# Patient Record
Sex: Male | Born: 1955 | Race: Black or African American | Hispanic: No | Marital: Single | Smoking: Former smoker
Health system: Southern US, Community
[De-identification: ages and names within clinical notes are randomized; demographics above are authoritative.]

## PROBLEM LIST (undated history)

## (undated) DIAGNOSIS — D649 Anemia, unspecified: Secondary | ICD-10-CM

## (undated) DIAGNOSIS — E1169 Type 2 diabetes mellitus with other specified complication: Secondary | ICD-10-CM

## (undated) DIAGNOSIS — I4510 Unspecified right bundle-branch block: Secondary | ICD-10-CM

## (undated) DIAGNOSIS — I639 Cerebral infarction, unspecified: Secondary | ICD-10-CM

## (undated) DIAGNOSIS — H35039 Hypertensive retinopathy, unspecified eye: Secondary | ICD-10-CM

## (undated) DIAGNOSIS — I10 Essential (primary) hypertension: Secondary | ICD-10-CM

## (undated) DIAGNOSIS — I130 Hypertensive heart and chronic kidney disease with heart failure and stage 1 through stage 4 chronic kidney disease, or unspecified chronic kidney disease: Secondary | ICD-10-CM

## (undated) DIAGNOSIS — E1129 Type 2 diabetes mellitus with other diabetic kidney complication: Secondary | ICD-10-CM

## (undated) DIAGNOSIS — I1A0 Resistant hypertension: Secondary | ICD-10-CM

## (undated) DIAGNOSIS — E11319 Type 2 diabetes mellitus with unspecified diabetic retinopathy without macular edema: Secondary | ICD-10-CM

## (undated) DIAGNOSIS — E119 Type 2 diabetes mellitus without complications: Secondary | ICD-10-CM

## (undated) DIAGNOSIS — N189 Chronic kidney disease, unspecified: Secondary | ICD-10-CM

## (undated) DIAGNOSIS — I509 Heart failure, unspecified: Secondary | ICD-10-CM

## (undated) DIAGNOSIS — E785 Hyperlipidemia, unspecified: Secondary | ICD-10-CM

## (undated) DIAGNOSIS — B1920 Unspecified viral hepatitis C without hepatic coma: Secondary | ICD-10-CM

## (undated) DIAGNOSIS — M549 Dorsalgia, unspecified: Secondary | ICD-10-CM

## (undated) DIAGNOSIS — E261 Secondary hyperaldosteronism: Secondary | ICD-10-CM

## (undated) HISTORY — DX: Unspecified viral hepatitis C without hepatic coma: B19.20

## (undated) HISTORY — DX: Dorsalgia, unspecified: M54.9

## (undated) HISTORY — DX: Essential (primary) hypertension: I10

## (undated) HISTORY — DX: Resistant hypertension: I1A.0

## (undated) HISTORY — DX: Type 2 diabetes mellitus with other specified complication: E11.69

## (undated) HISTORY — PX: CATARACT EXTRACTION: SUR2

## (undated) HISTORY — DX: Heart failure, unspecified: I50.9

## (undated) HISTORY — DX: Type 2 diabetes mellitus without complications: E11.9

## (undated) HISTORY — DX: Type 2 diabetes mellitus with unspecified diabetic retinopathy without macular edema: E11.319

## (undated) HISTORY — DX: Unspecified right bundle-branch block: I45.10

## (undated) HISTORY — DX: Type 2 diabetes mellitus with other specified complication: E78.5

## (undated) HISTORY — DX: Cerebral infarction, unspecified: I63.9

## (undated) HISTORY — DX: Chronic kidney disease, unspecified: N18.9

## (undated) HISTORY — DX: Hypertensive heart and chronic kidney disease with heart failure and stage 1 through stage 4 chronic kidney disease, or unspecified chronic kidney disease: I13.0

## (undated) HISTORY — PX: EYE SURGERY: SHX253

## (undated) HISTORY — DX: Secondary hyperaldosteronism: E26.1

## (undated) HISTORY — DX: Hypertensive retinopathy, unspecified eye: H35.039

## (undated) HISTORY — DX: Type 2 diabetes mellitus with other diabetic kidney complication: E11.29

---

## 1993-06-13 DIAGNOSIS — G8929 Other chronic pain: Secondary | ICD-10-CM

## 1993-06-13 DIAGNOSIS — M545 Low back pain, unspecified: Secondary | ICD-10-CM

## 1993-06-13 HISTORY — DX: Low back pain, unspecified: M54.50

## 1993-06-13 HISTORY — PX: BACK SURGERY: SHX140

## 1993-06-13 HISTORY — DX: Other chronic pain: G89.29

## 1999-02-24 ENCOUNTER — Emergency Department (HOSPITAL_COMMUNITY): Admission: EM | Admit: 1999-02-24 | Discharge: 1999-02-24 | Payer: Self-pay | Admitting: Emergency Medicine

## 1999-06-28 ENCOUNTER — Encounter: Payer: Self-pay | Admitting: Emergency Medicine

## 1999-06-28 ENCOUNTER — Emergency Department (HOSPITAL_COMMUNITY): Admission: EM | Admit: 1999-06-28 | Discharge: 1999-06-28 | Payer: Self-pay | Admitting: Emergency Medicine

## 1999-10-26 ENCOUNTER — Emergency Department (HOSPITAL_COMMUNITY): Admission: EM | Admit: 1999-10-26 | Discharge: 1999-10-26 | Payer: Self-pay | Admitting: Emergency Medicine

## 2002-01-03 ENCOUNTER — Encounter: Payer: Self-pay | Admitting: *Deleted

## 2002-01-03 ENCOUNTER — Encounter (INDEPENDENT_AMBULATORY_CARE_PROVIDER_SITE_OTHER): Payer: Self-pay | Admitting: Specialist

## 2002-01-03 ENCOUNTER — Ambulatory Visit (HOSPITAL_COMMUNITY): Admission: RE | Admit: 2002-01-03 | Discharge: 2002-01-03 | Payer: Self-pay | Admitting: *Deleted

## 2003-05-19 ENCOUNTER — Encounter: Admission: RE | Admit: 2003-05-19 | Discharge: 2003-05-19 | Payer: Self-pay | Admitting: *Deleted

## 2004-04-08 ENCOUNTER — Emergency Department (HOSPITAL_COMMUNITY): Admission: EM | Admit: 2004-04-08 | Discharge: 2004-04-08 | Payer: Self-pay | Admitting: Emergency Medicine

## 2004-04-14 ENCOUNTER — Encounter: Admission: RE | Admit: 2004-04-14 | Discharge: 2004-04-14 | Payer: Self-pay | Admitting: Cardiology

## 2004-04-14 ENCOUNTER — Ambulatory Visit (HOSPITAL_COMMUNITY): Admission: RE | Admit: 2004-04-14 | Discharge: 2004-04-14 | Payer: Self-pay | Admitting: Cardiology

## 2004-07-28 ENCOUNTER — Emergency Department (HOSPITAL_COMMUNITY): Admission: EM | Admit: 2004-07-28 | Discharge: 2004-07-29 | Payer: Self-pay | Admitting: Emergency Medicine

## 2008-08-01 ENCOUNTER — Ambulatory Visit (HOSPITAL_BASED_OUTPATIENT_CLINIC_OR_DEPARTMENT_OTHER): Admission: RE | Admit: 2008-08-01 | Discharge: 2008-08-01 | Payer: Self-pay | Admitting: Urology

## 2010-07-03 ENCOUNTER — Encounter: Payer: Self-pay | Admitting: Cardiology

## 2010-07-04 ENCOUNTER — Encounter: Payer: Self-pay | Admitting: Cardiology

## 2010-09-28 LAB — GLUCOSE, CAPILLARY: Glucose-Capillary: 270 mg/dL — ABNORMAL HIGH (ref 70–99)

## 2010-09-28 LAB — BASIC METABOLIC PANEL
BUN: 8 mg/dL (ref 6–23)
CO2: 25 mEq/L (ref 19–32)
GFR calc Af Amer: >60 mL/min (ref >60)
GFR calc non Af Amer: >60 mL/min (ref >60)
Glucose, Bld: 201 mg/dL — ABNORMAL HIGH (ref 70–99)
Sodium: 134 mEq/L — ABNORMAL LOW (ref 135–145)

## 2010-09-28 LAB — PSA: PSA: 0.51 ng/mL (ref 0.10–4.00)

## 2010-10-26 NOTE — Op Note (Signed)
NAME:  John Jacobson, John Jacobson                ACCOUNT NO.:  [id number]   MEDICAL RECORD NO.:  [id number]          PATIENT TYPE:  AMB   LOCATION:  [facility]                         FACILITY:  [facility]   PHYSICIAN:  Hanley Ben, M.D.  DATE OF BIRTH:  [birth year]   DATE OF PROCEDURE:  08/01/2008  DATE OF DISCHARGE:                               OPERATIVE REPORT   PREOPERATIVE DIAGNOSIS:  Right hydrocele.   POSTOPERATIVE DIAGNOSIS:  Right hydrocele.   PROCEDURE:  Right hydrocelectomy.   SURGEON:  Arvil Persons, M.D.   ANESTHESIA:  General.   INDICATIONS:  The patient is a 55 years old male who had been  complaining of increasing swelling of his scrotum.  He was found on  physical examination to have a right hydrocele that was confirmed by  ultrasound.  He had been having pain in the scrotum and wanted to have  it removed.  He is scheduled today for hydrocelectomy.   DESCRIPTION OF PROCEDURE:  The patient was identified by his wrist band  and proper time-out was taken.   The patient was prepped and draped and placed in the supine position  under general anesthesia.  The scrotum was infiltrated with 0.25%  Marcaine and then a longitudinal incision was made on the scrotum.  The  incision was carried down to the tunica vaginalis which was then  incised.  About 200 mL of clear fluid were drained out of the hydrocele  sac.  Three scrotal pearls were removed.  Then using the Lord's  technique the tunica vaginalis was imbricated with #3-0 chromic.  Hemostasis was secured with electrocautery.  Then the subcutaneous  tissues were closed with #3-0 chromic and the skin was closed with #3-0  chromic.  Sterile dressing was then applied..   The patient tolerated the procedure well and left the OR in satisfactory  condition to post anesthesia care unit.      Hanley Ben, M.D.  Electronically Signed     MN/MEDQ  D:  08/01/2008  T:  08/01/2008  Job:  [id number]

## 2011-04-08 ENCOUNTER — Emergency Department (HOSPITAL_COMMUNITY)
Admission: EM | Admit: 2011-04-08 | Discharge: 2011-04-08 | Disposition: A | Discharge status: Home / Self Care | Payer: Self-pay | Attending: Emergency Medicine | Admitting: Emergency Medicine

## 2011-04-08 ENCOUNTER — Emergency Department (HOSPITAL_COMMUNITY): Payer: Self-pay

## 2011-04-08 ENCOUNTER — Inpatient Hospital Stay (INDEPENDENT_AMBULATORY_CARE_PROVIDER_SITE_OTHER)
Admission: RE | Admit: 2011-04-08 | Discharge: 2011-04-08 | Disposition: A | Discharge status: Home / Self Care | Payer: Self-pay | Source: Ambulatory Visit | Attending: Family Medicine | Admitting: Family Medicine

## 2011-04-08 DIAGNOSIS — G8929 Other chronic pain: Secondary | ICD-10-CM | POA: Insufficient documentation

## 2011-04-08 DIAGNOSIS — R079 Chest pain, unspecified: Secondary | ICD-10-CM

## 2011-04-08 DIAGNOSIS — R070 Pain in throat: Secondary | ICD-10-CM | POA: Insufficient documentation

## 2011-04-08 DIAGNOSIS — R05 Cough: Secondary | ICD-10-CM | POA: Insufficient documentation

## 2011-04-08 DIAGNOSIS — J3489 Other specified disorders of nose and nasal sinuses: Secondary | ICD-10-CM | POA: Insufficient documentation

## 2011-04-08 DIAGNOSIS — R059 Cough, unspecified: Secondary | ICD-10-CM | POA: Insufficient documentation

## 2011-04-08 DIAGNOSIS — I10 Essential (primary) hypertension: Secondary | ICD-10-CM

## 2011-04-08 DIAGNOSIS — E119 Type 2 diabetes mellitus without complications: Secondary | ICD-10-CM | POA: Insufficient documentation

## 2011-04-08 DIAGNOSIS — M549 Dorsalgia, unspecified: Secondary | ICD-10-CM | POA: Insufficient documentation

## 2011-04-08 DIAGNOSIS — I4510 Unspecified right bundle-branch block: Secondary | ICD-10-CM | POA: Insufficient documentation

## 2011-04-08 DIAGNOSIS — R7989 Other specified abnormal findings of blood chemistry: Secondary | ICD-10-CM

## 2011-04-08 DIAGNOSIS — J069 Acute upper respiratory infection, unspecified: Secondary | ICD-10-CM | POA: Insufficient documentation

## 2011-04-08 LAB — POCT I-STAT TROPONIN I

## 2011-04-08 LAB — BASIC METABOLIC PANEL
Calcium: 9.7 mg/dL (ref 8.4–10.5)
GFR calc non Af Amer: >90 mL/min (ref >90)
Glucose, Bld: 177 mg/dL — ABNORMAL HIGH (ref 70–99)
Potassium: 3.6 mEq/L (ref 3.5–5.1)
Sodium: 141 mEq/L (ref 135–145)

## 2011-04-08 LAB — DIFFERENTIAL
Basophils Absolute: 0 10*3/uL (ref 0.0–0.1)
Basophils Relative: 0 % (ref 0–1)
Eosinophils Absolute: 0.3 10*3/uL (ref 0.0–0.7)
Eosinophils Relative: 2 % (ref 0–5)
Neutrophils Relative %: 50 % (ref 43–77)

## 2011-04-08 LAB — CBC
Platelets: 177 10*3/uL (ref 150–400)
RDW: 13.2 % (ref 11.5–15.5)
WBC: 11.8 10*3/uL — ABNORMAL HIGH (ref 4.0–10.5)

## 2011-04-08 LAB — GLUCOSE, CAPILLARY: Glucose-Capillary: 203 mg/dL — ABNORMAL HIGH (ref 70–99)

## 2011-05-23 ENCOUNTER — Encounter: Payer: Self-pay | Admitting: Cardiology

## 2011-05-23 ENCOUNTER — Encounter: Payer: Self-pay | Admitting: *Deleted

## 2011-05-24 ENCOUNTER — Encounter: Payer: Self-pay | Admitting: Cardiology

## 2011-05-24 ENCOUNTER — Ambulatory Visit (INDEPENDENT_AMBULATORY_CARE_PROVIDER_SITE_OTHER): Payer: Self-pay | Admitting: Cardiology

## 2011-05-24 DIAGNOSIS — Z720 Tobacco use: Secondary | ICD-10-CM | POA: Insufficient documentation

## 2011-05-24 DIAGNOSIS — F17200 Nicotine dependence, unspecified, uncomplicated: Secondary | ICD-10-CM

## 2011-05-24 DIAGNOSIS — R9431 Abnormal electrocardiogram [ECG] [EKG]: Secondary | ICD-10-CM | POA: Insufficient documentation

## 2011-05-24 DIAGNOSIS — I10 Essential (primary) hypertension: Secondary | ICD-10-CM

## 2011-05-24 DIAGNOSIS — E1129 Type 2 diabetes mellitus with other diabetic kidney complication: Secondary | ICD-10-CM | POA: Insufficient documentation

## 2011-05-24 DIAGNOSIS — R079 Chest pain, unspecified: Secondary | ICD-10-CM | POA: Insufficient documentation

## 2011-05-24 DIAGNOSIS — E119 Type 2 diabetes mellitus without complications: Secondary | ICD-10-CM

## 2011-05-24 MED ORDER — AMLODIPINE BESYLATE 5 MG PO TABS: 5.0000 mg | ORAL_TABLET | Freq: Every day | ORAL | Status: DC

## 2011-05-24 NOTE — Assessment & Plan Note (Signed)
Patient has a right bundle branch block. Multiple risk factors. Plan stress echocardiogram for risk stratification.

## 2011-05-24 NOTE — Assessment & Plan Note (Signed)
Patient counseled on discontinuing. 

## 2011-05-24 NOTE — Progress Notes (Signed)
HPI: 55 year old male with no prior cardiac history for evaluation of chest pain and abnormal electrocardiogram. Patient typically has dyspnea only with more moderate activities. No orthopnea, PND, pedal edema, chest pain or syncope. On October 26 he was seen at urgent care for an upper respiratory infection. He was noted to have a cough and chest pain only with his cough. Electrocardiogram showed right bundle branch block. A troponin was negative. Chest x-ray negative. Cardiology asked to evaluate. Note the patient is not taking his Lotrel as he had swelling in his throat related to that medication.  No current outpatient prescriptions on file.    Allergies  Allergen Reactions  . Lotrel     Past Medical History  Diagnosis Date  . DM (diabetes mellitus)   . HTN (hypertension)   . RBBB (right bundle branch block)   . Asthma     Past Surgical History  Procedure Date  . Back surgery   . Hand surgery     History   Social History  . Marital Status: Single    Spouse Name: N/A    Number of Children: 3  . Years of Education: N/A   Occupational History  . Not on file.   Social History Main Topics  . Smoking status: Current Everyday Smoker  . Smokeless tobacco: Not on file  . Alcohol Use: Yes     Few beers every other day  . Drug Use: Not on file  . Sexually Active: Not on file   Other Topics Concern  . Not on file   Social History Narrative  . No narrative on file    Family History  Problem Relation Age of Onset  . Coronary artery disease Mother     MI in her 49s  . Hypertension    . Diabetes    . Alzheimer's disease      ROS: no fevers or chills, productive cough, hemoptysis, dysphasia, odynophagia, melena, hematochezia, dysuria, hematuria, rash, seizure activity, orthopnea, PND, pedal edema, claudication. Remaining systems are negative.  Physical Exam:   Blood pressure 181/110, pulse 80, height 5\' 7"  (1.702 m), weight 170 lb (77.111 kg).  General:  Well  developed/well nourished in NAD Skin warm/dry Patient not depressed No peripheral clubbing Back-normal HEENT-normal/normal eyelids Neck supple/normal carotid upstroke bilaterally; no bruits; no JVD; no thyromegaly chest - CTA/ normal expansion CV - RRR/normal S1 and S2; no murmurs, rubs or gallops;  PMI nondisplaced Abdomen -NT/ND, no HSM, no mass, + bowel sounds, no bruit 2+ femoral pulses, no bruits Ext-no edema, chords, 2+ DP Neuro-grossly nonfocal  ECG 04/08/11 - NSR with RBBB

## 2011-05-24 NOTE — Assessment & Plan Note (Signed)
Patient encouraged to follow up with primary care for further management.

## 2011-05-24 NOTE — Assessment & Plan Note (Addendum)
Patient had swelling of his throat with ACE inhibitor. Add Norvasc 5 mg daily. followup with primary care for further management.

## 2011-05-24 NOTE — Assessment & Plan Note (Signed)
Previous symptoms most likely from URI.

## 2011-05-24 NOTE — Patient Instructions (Signed)
Your physician recommends that you schedule a follow-up appointment in: AS NEEDED PENDING TEST RESULTS  Your physician has requested that you have a stress echocardiogram. For further information please visit [email or web address]. Please follow instruction sheet as given.   START AMLODIPINE 5 MG ONCE DAILY  FOLLOW UP WITH PRIMARY CARE FOR BLOOD PRESSURE AND BLOOD SUGAR

## 2011-06-10 ENCOUNTER — Other Ambulatory Visit (HOSPITAL_COMMUNITY): Payer: Self-pay | Admitting: Radiology

## 2011-06-24 ENCOUNTER — Other Ambulatory Visit (HOSPITAL_COMMUNITY): Payer: Self-pay | Admitting: Radiology

## 2014-06-13 HISTORY — PX: TOE AMPUTATION: SHX809

## 2016-03-28 ENCOUNTER — Emergency Department (HOSPITAL_COMMUNITY): Payer: Medicaid Other

## 2016-03-28 ENCOUNTER — Inpatient Hospital Stay (HOSPITAL_COMMUNITY)
Admission: EM | Admit: 2016-03-28 | Discharge: 2016-04-02 | DRG: 304 | Disposition: A | Discharge status: Home / Self Care | Payer: Medicaid Other | Attending: Internal Medicine | Admitting: Internal Medicine

## 2016-03-28 ENCOUNTER — Encounter (HOSPITAL_COMMUNITY): Payer: Self-pay | Admitting: Emergency Medicine

## 2016-03-28 DIAGNOSIS — N179 Acute kidney failure, unspecified: Secondary | ICD-10-CM

## 2016-03-28 DIAGNOSIS — Z888 Allergy status to other drugs, medicaments and biological substances status: Secondary | ICD-10-CM

## 2016-03-28 DIAGNOSIS — Z8249 Family history of ischemic heart disease and other diseases of the circulatory system: Secondary | ICD-10-CM

## 2016-03-28 DIAGNOSIS — Z833 Family history of diabetes mellitus: Secondary | ICD-10-CM

## 2016-03-28 DIAGNOSIS — Z9889 Other specified postprocedural states: Secondary | ICD-10-CM

## 2016-03-28 DIAGNOSIS — I4510 Unspecified right bundle-branch block: Secondary | ICD-10-CM | POA: Diagnosis present

## 2016-03-28 DIAGNOSIS — I5033 Acute on chronic diastolic (congestive) heart failure: Secondary | ICD-10-CM | POA: Diagnosis present

## 2016-03-28 DIAGNOSIS — K0889 Other specified disorders of teeth and supporting structures: Secondary | ICD-10-CM | POA: Diagnosis present

## 2016-03-28 DIAGNOSIS — I248 Other forms of acute ischemic heart disease: Secondary | ICD-10-CM | POA: Diagnosis present

## 2016-03-28 DIAGNOSIS — Z820 Family history of epilepsy and other diseases of the nervous system: Secondary | ICD-10-CM

## 2016-03-28 DIAGNOSIS — E1122 Type 2 diabetes mellitus with diabetic chronic kidney disease: Secondary | ICD-10-CM

## 2016-03-28 DIAGNOSIS — I130 Hypertensive heart and chronic kidney disease with heart failure and stage 1 through stage 4 chronic kidney disease, or unspecified chronic kidney disease: Secondary | ICD-10-CM | POA: Diagnosis present

## 2016-03-28 DIAGNOSIS — F17200 Nicotine dependence, unspecified, uncomplicated: Secondary | ICD-10-CM | POA: Diagnosis present

## 2016-03-28 DIAGNOSIS — Z794 Long term (current) use of insulin: Secondary | ICD-10-CM

## 2016-03-28 DIAGNOSIS — J45909 Unspecified asthma, uncomplicated: Secondary | ICD-10-CM | POA: Diagnosis present

## 2016-03-28 DIAGNOSIS — E1129 Type 2 diabetes mellitus with other diabetic kidney complication: Secondary | ICD-10-CM | POA: Diagnosis present

## 2016-03-28 DIAGNOSIS — N184 Chronic kidney disease, stage 4 (severe): Secondary | ICD-10-CM | POA: Diagnosis present

## 2016-03-28 DIAGNOSIS — R001 Bradycardia, unspecified: Secondary | ICD-10-CM | POA: Diagnosis present

## 2016-03-28 DIAGNOSIS — I160 Hypertensive urgency: Principal | ICD-10-CM | POA: Diagnosis present

## 2016-03-28 DIAGNOSIS — R079 Chest pain, unspecified: Secondary | ICD-10-CM | POA: Insufficient documentation

## 2016-03-28 LAB — CBC
HCT: 38.8 % — ABNORMAL LOW (ref 39.0–52.0)
Hemoglobin: 13 g/dL (ref 13.0–17.0)
MCH: 31.3 pg (ref 26.0–34.0)
MCHC: 33.5 g/dL (ref 30.0–36.0)
MCV: 93.3 fL (ref 78.0–100.0)
PLATELETS: 219 10*3/uL (ref 150–400)
RBC: 4.16 MIL/uL — ABNORMAL LOW (ref 4.22–5.81)
RDW: 14.4 % (ref 11.5–15.5)
WBC: 11.5 10*3/uL — ABNORMAL HIGH (ref 4.0–10.5)

## 2016-03-28 LAB — CBG MONITORING, ED
Glucose-Capillary: 143 mg/dL — ABNORMAL HIGH (ref 65–99)
Glucose-Capillary: 144 mg/dL — ABNORMAL HIGH (ref 65–99)

## 2016-03-28 LAB — BASIC METABOLIC PANEL
Anion gap: 8 (ref 5–15)
BUN: 31 mg/dL — AB (ref 6–20)
CHLORIDE: 108 mmol/L (ref 101–111)
CO2: 25 mmol/L (ref 22–32)
CREATININE: 1.9 mg/dL — AB (ref 0.61–1.24)
Calcium: 9.1 mg/dL (ref 8.9–10.3)
GFR calc Af Amer: 42 mL/min — ABNORMAL LOW (ref >60)
GFR calc non Af Amer: 36 mL/min — ABNORMAL LOW (ref >60)
GLUCOSE: 122 mg/dL — AB (ref 65–99)
Potassium: 4.5 mmol/L (ref 3.5–5.1)
Sodium: 141 mmol/L (ref 135–145)

## 2016-03-28 LAB — I-STAT TROPONIN, ED: Troponin i, poc: 0.02 ng/mL (ref 0.00–0.08)

## 2016-03-28 LAB — BRAIN NATRIURETIC PEPTIDE: B Natriuretic Peptide: 658.7 pg/mL — ABNORMAL HIGH (ref 0.0–100.0)

## 2016-03-28 MED ORDER — MORPHINE SULFATE (PF) 4 MG/ML IV SOLN
4.0000 mg | Freq: Once | INTRAVENOUS | Status: AC
  Administered 2016-03-28: 4 mg via INTRAVENOUS
  Filled 2016-03-28: qty 1

## 2016-03-28 MED ORDER — ASPIRIN 81 MG PO CHEW
324.0000 mg | CHEWABLE_TABLET | Freq: Once | ORAL | Status: AC
  Administered 2016-03-28: 324 mg via ORAL
  Filled 2016-03-28: qty 4

## 2016-03-28 MED ORDER — NITROGLYCERIN 0.4 MG SL SUBL
0.4000 mg | SUBLINGUAL_TABLET | SUBLINGUAL | Status: AC | PRN
  Administered 2016-03-28 (×3): 0.4 mg via SUBLINGUAL
  Filled 2016-03-28: qty 1

## 2016-03-28 NOTE — ED Triage Notes (Signed)
Pt is from home where he has had chest pain on the left side that is a tightness since last Wednesday.  He has also had increased BP and left ankle swelling.

## 2016-03-28 NOTE — ED Notes (Signed)
CBG 144 

## 2016-03-29 ENCOUNTER — Inpatient Hospital Stay (HOSPITAL_COMMUNITY): Payer: Medicaid Other

## 2016-03-29 ENCOUNTER — Encounter (HOSPITAL_COMMUNITY): Payer: Self-pay | Admitting: Emergency Medicine

## 2016-03-29 ENCOUNTER — Other Ambulatory Visit (HOSPITAL_COMMUNITY): Payer: Medicaid Other

## 2016-03-29 DIAGNOSIS — I248 Other forms of acute ischemic heart disease: Secondary | ICD-10-CM | POA: Diagnosis present

## 2016-03-29 DIAGNOSIS — R001 Bradycardia, unspecified: Secondary | ICD-10-CM | POA: Diagnosis present

## 2016-03-29 DIAGNOSIS — Z888 Allergy status to other drugs, medicaments and biological substances status: Secondary | ICD-10-CM | POA: Diagnosis not present

## 2016-03-29 DIAGNOSIS — K0889 Other specified disorders of teeth and supporting structures: Secondary | ICD-10-CM | POA: Diagnosis present

## 2016-03-29 DIAGNOSIS — I160 Hypertensive urgency: Secondary | ICD-10-CM | POA: Diagnosis present

## 2016-03-29 DIAGNOSIS — R079 Chest pain, unspecified: Secondary | ICD-10-CM | POA: Diagnosis not present

## 2016-03-29 DIAGNOSIS — I4510 Unspecified right bundle-branch block: Secondary | ICD-10-CM | POA: Diagnosis present

## 2016-03-29 DIAGNOSIS — R071 Chest pain on breathing: Secondary | ICD-10-CM

## 2016-03-29 DIAGNOSIS — Z8249 Family history of ischemic heart disease and other diseases of the circulatory system: Secondary | ICD-10-CM | POA: Diagnosis not present

## 2016-03-29 DIAGNOSIS — I130 Hypertensive heart and chronic kidney disease with heart failure and stage 1 through stage 4 chronic kidney disease, or unspecified chronic kidney disease: Secondary | ICD-10-CM | POA: Diagnosis present

## 2016-03-29 DIAGNOSIS — J45909 Unspecified asthma, uncomplicated: Secondary | ICD-10-CM | POA: Diagnosis present

## 2016-03-29 DIAGNOSIS — Z794 Long term (current) use of insulin: Secondary | ICD-10-CM | POA: Diagnosis not present

## 2016-03-29 DIAGNOSIS — Z820 Family history of epilepsy and other diseases of the nervous system: Secondary | ICD-10-CM | POA: Diagnosis not present

## 2016-03-29 DIAGNOSIS — N184 Chronic kidney disease, stage 4 (severe): Secondary | ICD-10-CM | POA: Diagnosis present

## 2016-03-29 DIAGNOSIS — N179 Acute kidney failure, unspecified: Secondary | ICD-10-CM | POA: Diagnosis present

## 2016-03-29 DIAGNOSIS — F17200 Nicotine dependence, unspecified, uncomplicated: Secondary | ICD-10-CM | POA: Diagnosis present

## 2016-03-29 DIAGNOSIS — Z833 Family history of diabetes mellitus: Secondary | ICD-10-CM | POA: Diagnosis not present

## 2016-03-29 DIAGNOSIS — Z9889 Other specified postprocedural states: Secondary | ICD-10-CM | POA: Diagnosis not present

## 2016-03-29 DIAGNOSIS — I5033 Acute on chronic diastolic (congestive) heart failure: Secondary | ICD-10-CM | POA: Diagnosis present

## 2016-03-29 DIAGNOSIS — E1122 Type 2 diabetes mellitus with diabetic chronic kidney disease: Secondary | ICD-10-CM | POA: Diagnosis present

## 2016-03-29 LAB — URINALYSIS, ROUTINE W REFLEX MICROSCOPIC
Bilirubin Urine: NEGATIVE
GLUCOSE, UA: NEGATIVE mg/dL
Ketones, ur: NEGATIVE mg/dL
LEUKOCYTES UA: NEGATIVE
Nitrite: NEGATIVE
PH: 5.5 (ref 5.0–8.0)
Protein, ur: 100 mg/dL — AB
Specific Gravity, Urine: 1.01 (ref 1.005–1.030)

## 2016-03-29 LAB — GLUCOSE, CAPILLARY
GLUCOSE-CAPILLARY: 198 mg/dL — AB (ref 65–99)
GLUCOSE-CAPILLARY: 204 mg/dL — AB (ref 65–99)
Glucose-Capillary: 130 mg/dL — ABNORMAL HIGH (ref 65–99)
Glucose-Capillary: 131 mg/dL — ABNORMAL HIGH (ref 65–99)
Glucose-Capillary: 222 mg/dL — ABNORMAL HIGH (ref 65–99)
Glucose-Capillary: 193 mg/dL — ABNORMAL HIGH (ref 65–99)

## 2016-03-29 LAB — COMPREHENSIVE METABOLIC PANEL
ALBUMIN: 3.8 g/dL (ref 3.5–5.0)
ALK PHOS: 83 U/L (ref 38–126)
ALT: 50 U/L (ref 17–63)
ANION GAP: 8 (ref 5–15)
AST: 36 U/L (ref 15–41)
BUN: 30 mg/dL — AB (ref 6–20)
CALCIUM: 9.3 mg/dL (ref 8.9–10.3)
CO2: 26 mmol/L (ref 22–32)
Chloride: 106 mmol/L (ref 101–111)
Creatinine, Ser: 1.78 mg/dL — ABNORMAL HIGH (ref 0.61–1.24)
GFR calc Af Amer: 46 mL/min — ABNORMAL LOW (ref >60)
GFR calc non Af Amer: 39 mL/min — ABNORMAL LOW (ref >60)
GLUCOSE: 147 mg/dL — AB (ref 65–99)
POTASSIUM: 4 mmol/L (ref 3.5–5.1)
SODIUM: 140 mmol/L (ref 135–145)
Total Bilirubin: 1.2 mg/dL (ref 0.3–1.2)
Total Protein: 7.7 g/dL (ref 6.5–8.1)

## 2016-03-29 LAB — ECHOCARDIOGRAM COMPLETE
Height: 67 in
Weight: 2688 oz

## 2016-03-29 LAB — URINE MICROSCOPIC-ADD ON

## 2016-03-29 LAB — TROPONIN I
TROPONIN I: 0.03 ng/mL — AB (ref <0.03)
TROPONIN I: 0.03 ng/mL — AB (ref <0.03)
Troponin I: <0.03 ng/mL (ref <0.03)

## 2016-03-29 LAB — RAPID URINE DRUG SCREEN, HOSP PERFORMED
AMPHETAMINES: NOT DETECTED
Barbiturates: NOT DETECTED
Benzodiazepines: NOT DETECTED
Cocaine: NOT DETECTED
Opiates: POSITIVE — AB
Tetrahydrocannabinol: NOT DETECTED

## 2016-03-29 LAB — SODIUM, URINE, RANDOM: Sodium, Ur: 115 mmol/L

## 2016-03-29 LAB — TSH: TSH: 9.461 u[IU]/mL — ABNORMAL HIGH (ref 0.350–4.500)

## 2016-03-29 LAB — CREATININE, URINE, RANDOM: CREATININE, URINE: 35.18 mg/dL

## 2016-03-29 LAB — MRSA PCR SCREENING: MRSA BY PCR: NEGATIVE

## 2016-03-29 LAB — CBG MONITORING, ED: Glucose-Capillary: 151 mg/dL — ABNORMAL HIGH (ref 65–99)

## 2016-03-29 MED ORDER — ONDANSETRON HCL 4 MG/2ML IJ SOLN: 4.0000 mg | Freq: Four times a day (QID) | INTRAMUSCULAR | Status: DC | PRN

## 2016-03-29 MED ORDER — ONDANSETRON HCL 4 MG PO TABS: 4.0000 mg | ORAL_TABLET | Freq: Four times a day (QID) | ORAL | Status: DC | PRN

## 2016-03-29 MED ORDER — GLIPIZIDE 10 MG PO TABS
10.0000 mg | ORAL_TABLET | Freq: Every day | ORAL | Status: DC
  Administered 2016-03-29 – 2016-04-02 (×5): 10 mg via ORAL
  Filled 2016-03-29 (×6): qty 1

## 2016-03-29 MED ORDER — HYDRALAZINE HCL 50 MG PO TABS
100.0000 mg | ORAL_TABLET | Freq: Three times a day (TID) | ORAL | Status: DC
  Administered 2016-03-29 – 2016-04-02 (×13): 100 mg via ORAL
  Filled 2016-03-29 (×13): qty 2

## 2016-03-29 MED ORDER — ASPIRIN EC 81 MG PO TBEC
81.0000 mg | DELAYED_RELEASE_TABLET | Freq: Every day | ORAL | Status: DC
  Administered 2016-03-29 – 2016-04-02 (×5): 81 mg via ORAL
  Filled 2016-03-29 (×5): qty 1

## 2016-03-29 MED ORDER — INSULIN ASPART 100 UNIT/ML ~~LOC~~ SOLN
0.0000 [IU] | Freq: Three times a day (TID) | SUBCUTANEOUS | Status: DC
  Administered 2016-03-29 (×2): 3 [IU] via SUBCUTANEOUS
  Administered 2016-03-30 (×2): 2 [IU] via SUBCUTANEOUS
  Administered 2016-03-30 – 2016-03-31 (×3): 3 [IU] via SUBCUTANEOUS
  Administered 2016-04-01 – 2016-04-02 (×2): 2 [IU] via SUBCUTANEOUS
  Administered 2016-04-02: 3 [IU] via SUBCUTANEOUS

## 2016-03-29 MED ORDER — DIPHENHYDRAMINE HCL 25 MG PO CAPS: 25.0000 mg | ORAL_CAPSULE | ORAL | Status: DC | PRN

## 2016-03-29 MED ORDER — LOSARTAN POTASSIUM 50 MG PO TABS
100.0000 mg | ORAL_TABLET | Freq: Every day | ORAL | Status: DC
  Administered 2016-03-29 – 2016-04-01 (×4): 100 mg via ORAL
  Filled 2016-03-29 (×4): qty 2

## 2016-03-29 MED ORDER — CARVEDILOL 25 MG PO TABS
25.0000 mg | ORAL_TABLET | Freq: Two times a day (BID) | ORAL | Status: DC
  Administered 2016-03-29 – 2016-04-01 (×6): 25 mg via ORAL
  Filled 2016-03-29: qty 1
  Filled 2016-03-29 (×5): qty 2
  Filled 2016-03-29: qty 1
  Filled 2016-03-29: qty 2

## 2016-03-29 MED ORDER — ACETAMINOPHEN 650 MG RE SUPP: 650.0000 mg | Freq: Four times a day (QID) | RECTAL | Status: DC | PRN

## 2016-03-29 MED ORDER — ATENOLOL 25 MG PO TABS
50.0000 mg | ORAL_TABLET | Freq: Two times a day (BID) | ORAL | Status: DC
  Administered 2016-03-29: 50 mg via ORAL
  Filled 2016-03-29: qty 2

## 2016-03-29 MED ORDER — ACETAMINOPHEN 325 MG PO TABS
650.0000 mg | ORAL_TABLET | Freq: Four times a day (QID) | ORAL | Status: DC | PRN
  Administered 2016-03-29 – 2016-03-31 (×7): 650 mg via ORAL
  Filled 2016-03-29 (×7): qty 2

## 2016-03-29 MED ORDER — MORPHINE SULFATE (PF) 4 MG/ML IV SOLN
4.0000 mg | Freq: Once | INTRAVENOUS | Status: AC
  Administered 2016-03-29: 4 mg via INTRAVENOUS
  Filled 2016-03-29: qty 1

## 2016-03-29 MED ORDER — INSULIN GLARGINE 100 UNIT/ML ~~LOC~~ SOLN
12.0000 [IU] | Freq: Every day | SUBCUTANEOUS | Status: DC
Start: 2016-03-29 — End: 2016-04-02
  Administered 2016-03-29 – 2016-04-01 (×4): 12 [IU] via SUBCUTANEOUS
  Filled 2016-03-29 (×5): qty 0.12

## 2016-03-29 MED ORDER — NITROGLYCERIN IN D5W 200-5 MCG/ML-% IV SOLN
0.0000 ug/min | Freq: Once | INTRAVENOUS | Status: AC
  Administered 2016-03-29: 5 ug/min via INTRAVENOUS
  Filled 2016-03-29: qty 250

## 2016-03-29 MED ORDER — FUROSEMIDE 10 MG/ML IJ SOLN
40.0000 mg | Freq: Once | INTRAMUSCULAR | Status: AC
Start: 2016-03-29 — End: 2016-03-29
  Administered 2016-03-29: 40 mg via INTRAVENOUS
  Filled 2016-03-29: qty 4

## 2016-03-29 MED ORDER — NITROGLYCERIN IN D5W 200-5 MCG/ML-% IV SOLN
0.0000 ug/min | INTRAVENOUS | Status: DC
Start: 2016-03-29 — End: 2016-04-02
  Administered 2016-03-29: 10 ug/min via INTRAVENOUS
  Administered 2016-03-29: 40 ug/min via INTRAVENOUS
  Administered 2016-03-29: 45 ug/min via INTRAVENOUS
  Administered 2016-03-29 (×2): 50 ug/min via INTRAVENOUS
  Administered 2016-03-30: 5 ug/min via INTRAVENOUS
  Filled 2016-03-29 (×2): qty 250

## 2016-03-29 MED ORDER — FUROSEMIDE 10 MG/ML IJ SOLN
40.0000 mg | Freq: Once | INTRAMUSCULAR | Status: DC
  Filled 2016-03-29: qty 4

## 2016-03-29 MED ORDER — SALINE SPRAY 0.65 % NA SOLN
1.0000 | NASAL | Status: DC | PRN
  Administered 2016-03-30 – 2016-04-02 (×2): 1 via NASAL
  Filled 2016-03-29 (×2): qty 44

## 2016-03-29 MED ORDER — FUROSEMIDE 10 MG/ML IJ SOLN
40.0000 mg | Freq: Two times a day (BID) | INTRAMUSCULAR | Status: DC
  Administered 2016-03-29 – 2016-03-30 (×2): 40 mg via INTRAVENOUS
  Filled 2016-03-29 (×2): qty 4

## 2016-03-29 MED ORDER — AMLODIPINE BESYLATE 5 MG PO TABS
5.0000 mg | ORAL_TABLET | Freq: Every day | ORAL | Status: DC
  Administered 2016-03-29: 5 mg via ORAL
  Filled 2016-03-29: qty 1

## 2016-03-29 MED ORDER — FUROSEMIDE 10 MG/ML IJ SOLN
40.0000 mg | Freq: Once | INTRAMUSCULAR | Status: AC
  Administered 2016-03-29: 40 mg via INTRAVENOUS
  Filled 2016-03-29: qty 4

## 2016-03-29 MED ORDER — MORPHINE SULFATE (PF) 2 MG/ML IV SOLN
2.0000 mg | INTRAVENOUS | Status: DC | PRN
  Administered 2016-03-29 – 2016-03-31 (×8): 2 mg via INTRAVENOUS
  Filled 2016-03-29 (×8): qty 1

## 2016-03-29 NOTE — Consult Note (Addendum)
Admit date: 03/28/2016 Referring Physician  Dr. Wendee Beavers Primary Physician No primary care provider on file. Primary Cardiologist  Dr. Evern Bio seen in 2012 Reason for Consultation  chest pain  HPI: 60 year old male with hypertensive urgency admitted with shortness of breath and chest pain in the setting of blood pressure more than 030 systolic. Chronic right bundle branch block noted. Chest pain was also left-sided lasting approximately 30 minutes to 1 hour during periodic episodes. Nonexertional. Sometimes worse with deep breath on left side. Has history of asthma. Edema may have been worse. He notes medication compliance during history and physical.  Back in 2012, he saw my colleague Dr. Stanford Breed in the outpatient setting and was asked to perform a stress test. I do not see that this was ever completed.  He has been given Lasix IV 40 mg.  He reports compliance with home dose of amlodipine, hydralazine, Cozaar and Tenormin. Has had severe HTN for quite some time, oral surgeon has not been able to pull teeth because of this.   PMH:   Past Medical History:  Diagnosis Date  . Asthma   . DM (diabetes mellitus) ([place])   . HTN (hypertension)   . RBBB (right bundle branch block)     PSH:   Past Surgical History:  Procedure Laterality Date  . BACK SURGERY    . HAND SURGERY     Allergies:  Amlodipine besy-benazepril hcl Prior to Admit Meds:   Prior to Admission medications   Medication Sig Start Date End Date Taking? Authorizing Provider  atenolol (TENORMIN) 50 MG tablet Take 50 mg by mouth 2 (two) times daily.   Yes Historical Provider, MD  furosemide (LASIX) 20 MG tablet Take 20 mg by mouth daily.   Yes Historical Provider, MD  glipiZIDE (GLUCOTROL) 10 MG tablet Take 10 mg by mouth daily before breakfast.   Yes Historical Provider, MD  glucose 4 GM chewable tablet Chew 1 tablet by mouth daily as needed for low blood sugar.   Yes Historical Provider, MD  hydrALAZINE (APRESOLINE)  100 MG tablet Take 100 mg by mouth 3 (three) times daily.   Yes Historical Provider, MD  ibuprofen (ADVIL,MOTRIN) 600 MG tablet Take 600 mg by mouth 2 (two) times daily.   Yes Historical Provider, MD  insulin glargine (LANTUS) 100 UNIT/ML injection Inject 12-16 Units into the skin at bedtime. Sliding scale.   Yes Historical Provider, MD  losartan (COZAAR) 100 MG tablet Take 100 mg by mouth daily.   Yes Historical Provider, MD  Skin Protectants, Misc. (EUCERIN) cream Apply 1 application topically daily. On feet   Yes Historical Provider, MD  amLODipine (NORVASC) 5 MG tablet Take 1 tablet (5 mg total) by mouth daily. 05/24/11 05/23/12  Lelon Perla, MD   Current meds: Scheduled Meds: . amLODipine  5 mg Oral Daily  . aspirin EC  81 mg Oral Daily  . atenolol  50 mg Oral BID  . furosemide  40 mg Intravenous Once  . glipiZIDE  10 mg Oral QAC breakfast  . hydrALAZINE  100 mg Oral TID  . insulin glargine  12 Units Subcutaneous QHS  . losartan  100 mg Oral Daily   Continuous Infusions: . nitroGLYCERIN 45 mcg/min (03/29/16 1156)   PRN Meds:.acetaminophen **OR** acetaminophen, morphine injection, ondansetron **OR** ondansetron (ZOFRAN) IV  Fam HX:    Family History  Problem Relation Age of Onset  . Coronary artery disease Mother     MI in her 79s  . Hypertension    .  Diabetes    . Alzheimer's disease     Social HX:    Social History   Social History  . Marital status: Single    Spouse name: N/A  . Number of children: 3  . Years of education: N/A   Occupational History  . Not on file.   Social History Main Topics  . Smoking status: Current Every Day Smoker  . Smokeless tobacco: Never Used  . Alcohol use Yes     Comment: Few beers every other day  . Drug use: No  . Sexual activity: Not Currently   Other Topics Concern  . Not on file   Social History Narrative  . No narrative on file     ROS:  All 11 ROS were addressed and are negative except what is stated in the  HPI   Physical Exam: Blood pressure (!) 196/99, pulse 61, temperature 97.8 F (36.6 C), temperature source Oral, resp. rate 15, height 5\' 7"  (1.702 m), weight 168 lb (76.2 kg), SpO2 96 %.   General: Well developed, well nourished, in no acute distress Head: Eyes PERRLA, No xanthomas.   Normal cephalic and atramatic  Lungs:   Clear bilaterally to auscultation and percussion. Normal respiratory effort. No wheezes, no rales. Heart:   HRRR S1 S2 +S3. Pulses are 2+ & equal. No murmur, rubs, gallops.  No carotid bruit. No JVD.  No abdominal bruits.  Abdomen: Bowel sounds are positive, abdomen soft and non-tender without masses. No hepatosplenomegaly. Msk:  Back normal. Normal strength and tone for age. Extremities:  No clubbing, cyanosis or edema.  DP +1 Neuro: Alert and oriented X 3, non-focal, MAE x 4 GU: Deferred Rectal: Deferred Psych:  Good affect, responds appropriately      Labs: Lab Results  Component Value Date   WBC 11.5 (H) 03/28/2016   HGB 13.0 03/28/2016   HCT 38.8 (L) 03/28/2016   MCV 93.3 03/28/2016   PLT 219 03/28/2016     Recent Labs Lab 03/29/16 0704  NA 140  K 4.0  CL 106  CO2 26  BUN 30*  CREATININE 1.78*  CALCIUM 9.3  PROT 7.7  BILITOT 1.2  ALKPHOS 83  ALT 50  AST 36  GLUCOSE 147*    Recent Labs  03/29/16 0704  TROPONINI 0.03*   No results found for: CHOL, HDL, LDLCALC, TRIG No results found for: DDIMER   Radiology:  Dg Chest 2 View  Result Date: 03/28/2016 CLINICAL DATA:  Left-sided chest pain with shortness of Breath EXAM: CHEST  2 VIEW COMPARISON:  04/08/2011 FINDINGS: Cardiac shadow is mildly enlarged. The lungs are well aerated bilaterally. Mild fullness of the central vasculature is noted without pulmonary edema. No other focal abnormality is seen. IMPRESSION: Mild vascular congestion without pulmonary edema Electronically Signed   By: Inez Catalina M.D.   On: 03/28/2016 21:03   Personally viewed.  EKG:  Right bundle branch block  chronic with no ST segment changes Personally viewed.   ASSESSMENT/PLAN:    60 year old male with hypertensive urgency who complained of chest discomfort.  Chest pain/hypertensive urgency  - Troponin minimally elevated at 0.03  - Currently feeling better. ECHO P  - Certainly his chest discomfort a be attributed to supply demand mismatch in setting of severe hypertension and #1 goal is to control his blood pressure or effectively. Continue to utilize nitroglycerin drip then wean when possible.  - BNP elevated at 658 - likely secondary to atrial stretch in the setting of hypertensive urgency. As  Lasix is utilized IV, and finds status under better control, blood pressure should follow.  - I would be hesitant to use spironolactone which is often helpful in multidrug resistant hypertension given his chronic kidney disease and risk for hyperkalemia.  - Once blood pressure is under better control, I would advocate for nuclear stress test almost troponin becomes markedly positive.  - changing atenolol 50 BID to COREG 25 BID (may give better BP control)  Right bundle branch block  - Chronic, no change from 2012 EKG. Her no ischemic changes noted.  Mildly elevated troponin-0.03  - Possibly demand ischemia in the setting of hypertensive urgency. If troponin however increases significantly, greater than 500% for instance, one could consider further ischemic evaluation such as cardiac catheterization.  Chronic kidney disease stage 3/4  - Creatinine 1.78  We will follow along  Candee Furbish, MD  03/29/2016  12:36 PM

## 2016-03-29 NOTE — H&P (Signed)
History and Physical    John Jacobson:[id number] DOB: 1955/09/24 DOA: 03/28/2016  PCP: No primary care provider on file.  Patient coming from: Home.  Chief Complaint: Shortness of breath and chest pain.  HPI: John Jacobson is a 60 y.o. male with hypertension, diabetes mellitus presents to the ER because of shortness of breath and chest pain. Patient has been having these symptoms for last 3 days. Patient's shortness of breath increases on exertion. Denies any associated productive cough fever chills. Chest pain is left side of the chest and also there is some congestion-like feeling in the center of the chest. Pain lasts for around half an hour to 1 hour each time and recurs. Patient also noticed some lower extremity edema. In the ER patient's blood pressure was more than 811 systolic with chest x-ray showing congestion and EKG showing sinus rhythm with RBBB. Cardiac markers were negative. Patient is being admitted for hypertensive urgency with possible CHF and further management of chest pain. Patient states he has been compliant with his medications.   ED Course: Patient was started on nitroglycerin infusion.  Review of Systems: As per HPI, rest all negative.   Past Medical History:  Diagnosis Date  . Asthma   . DM (diabetes mellitus) ([place])   . HTN (hypertension)   . RBBB (right bundle branch block)     Past Surgical History:  Procedure Laterality Date  . BACK SURGERY    . HAND SURGERY       reports that he has been smoking.  He has never used smokeless tobacco. He reports that he drinks alcohol. His drug history is not on file.  Allergies  Allergen Reactions  . Amlodipine Besy-Benazepril Hcl Shortness Of Breath and Swelling    Mouth and tongue swelling    Family History  Problem Relation Age of Onset  . Coronary artery disease Mother     MI in her 35s  . Hypertension    . Diabetes    . Alzheimer's disease      Prior to Admission medications   Medication Sig  Start Date End Date Taking? Authorizing Provider  atenolol (TENORMIN) 50 MG tablet Take 50 mg by mouth 2 (two) times daily.   Yes Historical Provider, MD  furosemide (LASIX) 20 MG tablet Take 20 mg by mouth daily.   Yes Historical Provider, MD  glipiZIDE (GLUCOTROL) 10 MG tablet Take 10 mg by mouth daily before breakfast.   Yes Historical Provider, MD  glucose 4 GM chewable tablet Chew 1 tablet by mouth daily as needed for low blood sugar.   Yes Historical Provider, MD  hydrALAZINE (APRESOLINE) 100 MG tablet Take 100 mg by mouth 3 (three) times daily.   Yes Historical Provider, MD  ibuprofen (ADVIL,MOTRIN) 600 MG tablet Take 600 mg by mouth 2 (two) times daily.   Yes Historical Provider, MD  insulin glargine (LANTUS) 100 UNIT/ML injection Inject 12-16 Units into the skin at bedtime. Sliding scale.   Yes Historical Provider, MD  losartan (COZAAR) 100 MG tablet Take 100 mg by mouth daily.   Yes Historical Provider, MD  Skin Protectants, Misc. (EUCERIN) cream Apply 1 application topically daily. On feet   Yes Historical Provider, MD  amLODipine (NORVASC) 5 MG tablet Take 1 tablet (5 mg total) by mouth daily. 05/24/11 05/23/12  Lelon Perla, MD    Physical Exam: Vitals:   03/29/16 0315 03/29/16 0330 03/29/16 0504 03/29/16 0530  BP: (!) 179/102 (!) 185/103 (!) 201/104 (!) 184/110  Pulse: 65 64 70 64  Resp: 18 16 19 17   Temp:   98.9 F (37.2 C)   TempSrc:   Oral   SpO2: 93% 96% 100% 95%  Weight:      Height:          Constitutional: Moderately built and nourished. Vitals:   03/29/16 0315 03/29/16 0330 03/29/16 0504 03/29/16 0530  BP: (!) 179/102 (!) 185/103 (!) 201/104 (!) 184/110  Pulse: 65 64 70 64  Resp: 18 16 19 17   Temp:   98.9 F (37.2 C)   TempSrc:   Oral   SpO2: 93% 96% 100% 95%  Weight:      Height:       Eyes: Anicteric no pallor. ENMT: No discharge from the ears eyes nose or mouth. Neck: No JVD appreciated no mass felt. No neck rigidity. Respiratory: No rhonchi  or crepitations. Cardiovascular: S1 and S2 heard. No murmur appreciated. Abdomen: Soft nontender bowel sounds present. No guarding or rigidity. Musculoskeletal: No edema. No joint effusion. Skin: No rash skin appears warm. Neurologic: Alert awake oriented to time place and person. Moves all extremities. Psychiatric: Appears normal. Normal affect.   Labs on Admission: I have personally reviewed following labs and imaging studies  CBC:  Recent Labs Lab 03/28/16 2050  WBC 11.5*  HGB 13.0  HCT 38.8*  MCV 93.3  PLT 646   Basic Metabolic Panel:  Recent Labs Lab 03/28/16 2050  NA 141  K 4.5  CL 108  CO2 25  GLUCOSE 122*  BUN 31*  CREATININE 1.90*  CALCIUM 9.1   GFR: Estimated Creatinine Clearance: 38.2 mL/min (by C-G formula based on SCr of 1.9 mg/dL (H)). Liver Function Tests: No results for input(s): AST, ALT, ALKPHOS, BILITOT, PROT, ALBUMIN in the last 168 hours. No results for input(s): LIPASE, AMYLASE in the last 168 hours. No results for input(s): AMMONIA in the last 168 hours. Coagulation Profile: No results for input(s): INR, PROTIME in the last 168 hours. Cardiac Enzymes: No results for input(s): CKTOTAL, CKMB, CKMBINDEX, TROPONINI in the last 168 hours. BNP (last 3 results) No results for input(s): PROBNP in the last 8760 hours. HbA1C: No results for input(s): HGBA1C in the last 72 hours. CBG:  Recent Labs Lab 03/28/16 2259 03/29/16 0000 03/29/16 0059  GLUCAP 144* 143* 151*   Lipid Profile: No results for input(s): CHOL, HDL, LDLCALC, TRIG, CHOLHDL, LDLDIRECT in the last 72 hours. Thyroid Function Tests: No results for input(s): TSH, T4TOTAL, FREET4, T3FREE, THYROIDAB in the last 72 hours. Anemia Panel: No results for input(s): VITAMINB12, FOLATE, FERRITIN, TIBC, IRON, RETICCTPCT in the last 72 hours. Urine analysis: No results found for: COLORURINE, APPEARANCEUR, LABSPEC, PHURINE, GLUCOSEU, HGBUR, BILIRUBINUR, KETONESUR, PROTEINUR, UROBILINOGEN,  NITRITE, LEUKOCYTESUR Sepsis Labs: @LABRCNTIP (procalcitonin:4,lacticidven:4) )No results found for this or any previous visit (from the past 240 hour(s)).   Radiological Exams on Admission: Dg Chest 2 View  Result Date: 03/28/2016 CLINICAL DATA:  Left-sided chest pain with shortness of Breath EXAM: CHEST  2 VIEW COMPARISON:  04/08/2011 FINDINGS: Cardiac shadow is mildly enlarged. The lungs are well aerated bilaterally. Mild fullness of the central vasculature is noted without pulmonary edema. No other focal abnormality is seen. IMPRESSION: Mild vascular congestion without pulmonary edema Electronically Signed   By: Inez Catalina M.D.   On: 03/28/2016 21:03    EKG: Independently reviewed. Normal sinus rhythm with RBBB.  Assessment/Plan Principal Problem:   Hypertensive urgency Active Problems:   Chest pain   DM (diabetes mellitus), type 2  with renal complications ([place])    1. Hypertensive urgency - patient states he has been compliant with his medications. Continue home dose of amlodipine, hydralazine, Cozaar and Tenormin. Patient is on nitroglycerin infusion. Once patient takes home dose of antihypertensive will try to wean off nitroglycerin infusion. Check urine drug screen. 2. Possible CHF - I have ordered 1 dose of Lasix 40 mg IV which may also help with patient's blood pressure. Check 2-D echo. Cycle cardiac markers. Closely follow daily weights intake output and metabolic panel. 3. Chest pain - may be related to the blood pressure. Patient does have risk factors for ACS. Cycle cardiac markers aspirin and patient is on nitroglycerin. Check 2-D echo. Patient's pain improves so unlikely to be dissection. 4. Renal failure probably acute - patient's old labs of 5 years ago which was showing normal creatinine. No recent labs to compare. Check UA, FENa. Closely follow metabolic panel. May have to hold Cozaar and Lasix if creatinine worsens. 5. Diabetes mellitus type 2 - continue Lantus  insulin.   DVT prophylaxis: SCDs for now until blood pressure improves. Code Status: Full code.  Family Communication: Discussed with patient.  Disposition Plan: Home.  Consults called: None.  Admission status: Inpatient. Stepdown. Likely stay 2 days.    Rise Patience MD [facility] Pager [phone].  If 7PM-7AM, please contact night-coverage [email or web address] Password [email or web address]  03/29/2016, 6:40 AM

## 2016-03-29 NOTE — Progress Notes (Signed)
K.Schoor informed of Troponin level now 0.03

## 2016-03-29 NOTE — ED Notes (Signed)
CBG 151. 

## 2016-03-29 NOTE — Progress Notes (Signed)
  Echocardiogram 2D Echocardiogram has been performed.  John Jacobson 03/29/2016, 4:01 PM

## 2016-03-29 NOTE — Progress Notes (Addendum)
Patient seen and evaluated earlier this AM by my associate. Please refer to H and P for details. Will titrate of Nitro once blood pressures come down. Aiming for MAP of 123 at that point would hold off on nitro drip for the next 24 hours.  Gen: pt in nad, alert and awake CV: no cyanosis or clubbing Pulm: no increased wob, no wheezes  John Jacobson  Pt complaining of chest pain in the context of hypertensive emergency. I suspect it is secondary to this. [facility] for further evaluations from their standpoint.

## 2016-03-29 NOTE — Care Management Note (Signed)
Case Management Note  Patient Details  Name: John Jacobson MRN: [id number] Date of Birth: [birth year]  Subjective/Objective:      Chest pain with hypertension requiring iv ntg drip and antihypertensives              Action/Plan:  From home   Expected Discharge Date:                  Expected Discharge Plan:  Home/Self Care  In-House Referral:     Discharge planning Services     Post Acute Care Choice:    Choice offered to:     DME Arranged:    DME Agency:     HH Arranged:    HH Agency:     Status of Service:  In process, will continue to follow  If discussed at Long Length of Stay Meetings, dates discussed:    Additional Comments: Date:  March 29, 2016 Chart reviewed for concurrent status and case management needs. Will continue to follow the patient for status change: Discharge Planning: following for needs Expected discharge date: [id number] Velva Harman, BSN, [place], [place] Leeroy Cha, RN 03/29/2016, 8:45 AM

## 2016-03-29 NOTE — ED Notes (Signed)
CBG 143  

## 2016-03-30 ENCOUNTER — Inpatient Hospital Stay (HOSPITAL_COMMUNITY): Payer: Medicaid Other

## 2016-03-30 DIAGNOSIS — N179 Acute kidney failure, unspecified: Secondary | ICD-10-CM

## 2016-03-30 DIAGNOSIS — I160 Hypertensive urgency: Principal | ICD-10-CM

## 2016-03-30 DIAGNOSIS — Z794 Long term (current) use of insulin: Secondary | ICD-10-CM

## 2016-03-30 DIAGNOSIS — R079 Chest pain, unspecified: Secondary | ICD-10-CM

## 2016-03-30 DIAGNOSIS — E1122 Type 2 diabetes mellitus with diabetic chronic kidney disease: Secondary | ICD-10-CM

## 2016-03-30 LAB — BASIC METABOLIC PANEL
ANION GAP: 10 (ref 5–15)
BUN: 33 mg/dL — ABNORMAL HIGH (ref 6–20)
CHLORIDE: 102 mmol/L (ref 101–111)
CO2: 24 mmol/L (ref 22–32)
Calcium: 9.1 mg/dL (ref 8.9–10.3)
Creatinine, Ser: 2.07 mg/dL — ABNORMAL HIGH (ref 0.61–1.24)
GFR calc Af Amer: 38 mL/min — ABNORMAL LOW (ref >60)
GFR, EST NON AFRICAN AMERICAN: 33 mL/min — AB (ref >60)
GLUCOSE: 166 mg/dL — AB (ref 65–99)
POTASSIUM: 4.1 mmol/L (ref 3.5–5.1)
Sodium: 136 mmol/L (ref 135–145)

## 2016-03-30 LAB — T4, FREE: Free T4: 1.26 ng/dL — ABNORMAL HIGH (ref 0.61–1.12)

## 2016-03-30 LAB — GLUCOSE, CAPILLARY
GLUCOSE-CAPILLARY: 135 mg/dL — AB (ref 65–99)
GLUCOSE-CAPILLARY: 176 mg/dL — AB (ref 65–99)
Glucose-Capillary: 144 mg/dL — ABNORMAL HIGH (ref 65–99)
Glucose-Capillary: 128 mg/dL — ABNORMAL HIGH (ref 65–99)

## 2016-03-30 MED ORDER — AMLODIPINE BESYLATE 10 MG PO TABS
10.0000 mg | ORAL_TABLET | Freq: Every day | ORAL | Status: DC
  Administered 2016-03-30 – 2016-04-02 (×4): 10 mg via ORAL
  Filled 2016-03-30 (×4): qty 1

## 2016-03-30 MED ORDER — OXYCODONE HCL 5 MG PO TABS
5.0000 mg | ORAL_TABLET | ORAL | Status: DC | PRN
  Administered 2016-03-30 (×3): 10 mg via ORAL
  Administered 2016-03-31: 5 mg via ORAL
  Administered 2016-03-31 – 2016-04-01 (×5): 10 mg via ORAL
  Filled 2016-03-30 (×5): qty 2
  Filled 2016-03-30: qty 1
  Filled 2016-03-30 (×3): qty 2

## 2016-03-30 MED ORDER — CLONIDINE HCL 0.1 MG PO TABS
0.1000 mg | ORAL_TABLET | Freq: Three times a day (TID) | ORAL | Status: DC
  Administered 2016-03-30 – 2016-03-31 (×3): 0.1 mg via ORAL
  Filled 2016-03-30 (×3): qty 1

## 2016-03-30 MED ORDER — HYDRALAZINE HCL 20 MG/ML IJ SOLN
10.0000 mg | Freq: Four times a day (QID) | INTRAMUSCULAR | Status: DC | PRN
  Administered 2016-03-30: 10 mg via INTRAVENOUS
  Filled 2016-03-30: qty 1

## 2016-03-30 NOTE — Progress Notes (Addendum)
[facility] PROGRESS NOTE  EUGEAN ARNOTT LEX:[id number] DOB: February 08, 1956 DOA: 03/28/2016  PCP: No primary care provider on file.  Brief History/Interval Summary: 60 year old African-American male with a past medical history of hypertension, diabetes, presented with complaints of chest pain and shortness of breath. Symptoms had been ongoing for 3 days. Patient gets most of his care at the [place].  Reason for Visit: Malignant hypertension  Consultants: Cardiology  Procedures:  Transthoracic echocardiogram Study Conclusions  - Left ventricle: The cavity size was normal. There was moderate   concentric and severe asymmetric hypertrophy. Systolic function   was normal. The estimated ejection fraction was in the range of   60% to 65%. Wall motion was normal; there were no regional wall   motion abnormalities. Features are consistent with a pseudonormal   left ventricular filling pattern, with concomitant abnormal   relaxation and increased filling pressure (grade 2 diastolic   dysfunction). Doppler parameters are consistent with high   ventricular filling pressure. - Pulmonic valve: There was trivial regurgitation.   Antibiotics: None  Subjective/Interval History: Patient states that his chest pain has improved. He denies any difficulty breathing. Continues to have some headache. Apparently has also had some dental issues and is supposed to have his teeth removed in the near future. So, he also has tooth pain.  ROS: Denies any nausea or vomiting.  Objective:  Vital Signs  Vitals:   03/30/16 0800 03/30/16 0830 03/30/16 1020 03/30/16 1051  BP: (!) 202/91 (!) 202/91 (!) 240/135 (!) 238/114  Pulse:  70    Resp: (!) 21     Temp:      TempSrc:      SpO2: 95%     Weight:      Height:        Intake/Output Summary (Last 24 hours) at 03/30/16 1052 Last data filed at 03/30/16 1000  Gross per 24 hour  Intake             1550 ml  Output             4000 ml  Net             -2450 ml   Filed Weights   03/28/16 2001 03/30/16 0500  Weight: 76.2 kg (168 lb) 75.5 kg (166 lb 7.2 oz)    General appearance: alert, cooperative, appears stated age and no distress Resp: Few crackles at the bases. No wheezing. Reasonably good air entry bilaterally. Cardio: regular rate and rhythm, S1, S2 normal, no murmur, click, rub or gallop GI: soft, non-tender; bowel sounds normal; no masses,  no organomegaly Extremities: extremities normal, atraumatic, no cyanosis or edema Neurologic: Awake and alert. Oriented 3. No focal neurological deficits.  Lab Results:  Data Reviewed: I have personally reviewed following labs and imaging studies  CBC:  Recent Labs Lab 03/28/16 2050  WBC 11.5*  HGB 13.0  HCT 38.8*  MCV 93.3  PLT 449    Basic Metabolic Panel:  Recent Labs Lab 03/28/16 2050 03/29/16 0704 03/30/16 0804  NA 141 140 136  K 4.5 4.0 4.1  CL 108 106 102  CO2 25 26 24   GLUCOSE 122* 147* 166*  BUN 31* 30* 33*  CREATININE 1.90* 1.78* 2.07*  CALCIUM 9.1 9.3 9.1    GFR: Estimated Creatinine Clearance: 35 mL/min (by C-G formula based on SCr of 2.07 mg/dL (H)).  Liver Function Tests:  Recent Labs Lab 03/29/16 0704  AST 36  ALT 50  ALKPHOS 83  BILITOT 1.2  PROT 7.7  ALBUMIN 3.8   Cardiac Enzymes:  Recent Labs Lab 03/29/16 0704 03/29/16 1206 03/29/16 1805  TROPONINI 0.03* <0.03 0.03*    CBG:  Recent Labs Lab 03/29/16 1203 03/29/16 1549 03/29/16 1734 03/29/16 2224 03/30/16 0744  GLUCAP 198* 222* 193* 204* 144*    Thyroid Function Tests:  Recent Labs  03/29/16 0704 03/30/16 0804  TSH 9.461*  --   FREET4  --  1.26*     Recent Results (from the past 240 hour(s))  MRSA PCR Screening     Status: None   Collection Time: 03/29/16  6:24 AM  Result Value Ref Range Status   MRSA by PCR NEGATIVE NEGATIVE Final    Comment:        The GeneXpert MRSA Assay (FDA approved for NASAL specimens only), is one component of a comprehensive  MRSA colonization surveillance program. It is not intended to diagnose MRSA infection nor to guide or monitor treatment for MRSA infections.       Radiology Studies: Dg Chest 2 View  Result Date: 03/28/2016 CLINICAL DATA:  Left-sided chest pain with shortness of Breath EXAM: CHEST  2 VIEW COMPARISON:  04/08/2011 FINDINGS: Cardiac shadow is mildly enlarged. The lungs are well aerated bilaterally. Mild fullness of the central vasculature is noted without pulmonary edema. No other focal abnormality is seen. IMPRESSION: Mild vascular congestion without pulmonary edema Electronically Signed   By: Inez Catalina M.D.   On: 03/28/2016 21:03     Medications:  Scheduled: . amLODipine  10 mg Oral Daily  . aspirin EC  81 mg Oral Daily  . carvedilol  25 mg Oral BID WC  . furosemide  40 mg Intravenous Once  . glipiZIDE  10 mg Oral QAC breakfast  . hydrALAZINE  100 mg Oral TID  . insulin aspart  0-15 Units Subcutaneous TID WC  . insulin glargine  12 Units Subcutaneous QHS  . losartan  100 mg Oral Daily   Continuous: . 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 Patient was initially placed on nitroglycerin infusion. His symptoms improved. His blood pressure improved as well. He was also started on this home medication regimen. However, since last night blood pressure has climbed back up. We will increase the dose of his amlodipine. He may need to be placed back on the nitroglycerin infusion. Old records reviewed. He's always had significantly elevated blood pressures. In 4010, his systolic blood pressure was 181, when he  was seen by cardiology and their office. When he was seen by his primary care provider this past Thursday, his systolic was in the 272Z. At that time, he was started on oral hydralazine. Etiology for his hypertension is unclear. Could be primary but there could also be secondary issues. He appears to have chronic kidney disease. Proceed with renal ultrasound. May need to add additional agents such as clonidine. Currently he is on carvedilol, hydralazine, losartan and amlodipine.  Acute on chronic Diastolic CHF Echocardiogram does not diastolic dysfunction. Patient was given Lasix with good diuresis. He states that his lower extremity edema has improved. However His creatinine has climbed. Cut back Lasix today. Strict ins and outs and daily weights. Fluid restriction.  Chest pain. Most likely  secondary to repeated blood pressure. Cardiology is following and to determine further management. Continue aspirin.  Acute on chronic kidney disease, possibly stage III According to records, the patient showed me, it appears that he has been diagnosed with chronic kidney disease. Baseline renal function is not known, however. Check renal ultrasound. Monitor urine output.  Diabetes mellitus type 2. Continue with Lantus. Sliding scale insulin coverage. Check HbA1c.  Abnormal thyroid function tests. TSH is noted to be elevated. Free T4, however, also noted to be slightly related. Would recommend repeating these tests in a few weeks in the outpatient setting.   DVT Prophylaxis: SCDs    Code Status: Full code  Family Communication: Discussed with the patient. No family at bedside  Disposition Plan: Await improvement in blood pressure.   LOS: 1 day   [facility] Pager [phone] 03/30/2016, 10:52 AM  If 7PM-7AM, please contact night-coverage at [email or web address], password [facility]

## 2016-03-30 NOTE — Progress Notes (Addendum)
Patient Name: John Jacobson Date of Encounter: 03/30/2016  Primary Cardiologist: Dr. Talmadge Chad Problem List     Principal Problem:   Hypertensive urgency Active Problems:   Chest pain   DM (diabetes mellitus), type 2 with renal complications ([place])   ARF (acute renal failure) (HCC)    Subjective   NTG drip stopped yesterday evening. Became hypertensive again this AM with SBP > 200. Patient reports having significant mouth pain, planning to have multiple tooth extractions. He feels this is significantly contributing to his HTN.   Inpatient Medications    Scheduled Meds: . amLODipine  10 mg Oral Daily  . aspirin EC  81 mg Oral Daily  . carvedilol  25 mg Oral BID WC  . glipiZIDE  10 mg Oral QAC breakfast  . hydrALAZINE  100 mg Oral TID  . insulin aspart  0-15 Units Subcutaneous TID WC  . insulin glargine  12 Units Subcutaneous QHS  . losartan  100 mg Oral Daily   Continuous Infusions: . nitroGLYCERIN 5 mcg/min (03/30/16 1121)   PRN Meds: acetaminophen **OR** acetaminophen, diphenhydrAMINE, hydrALAZINE, morphine injection, ondansetron **OR** ondansetron (ZOFRAN) IV, oxyCODONE, sodium chloride   Vital Signs    Vitals:   03/30/16 0800 03/30/16 0830 03/30/16 1020 03/30/16 1051  BP: (!) 202/91 (!) 202/91 (!) 240/135 (!) 238/114  Pulse:  70    Resp: (!) 21     Temp:      TempSrc:      SpO2: 95%     Weight:      Height:        Intake/Output Summary (Last 24 hours) at 03/30/16 1123 Last data filed at 03/30/16 1000  Gross per 24 hour  Intake             1310 ml  Output             4000 ml  Net            -2690 ml   Filed Weights   03/28/16 2001 03/30/16 0500  Weight: 168 lb (76.2 kg) 166 lb 7.2 oz (75.5 kg)    Physical Exam   GEN: Well nourished, well developed, African American male appearing in no acute distress.  HEENT: Grossly normal.  Neck: Supple, no JVD, carotid bruits, or masses. Cardiac: RRR, no murmurs, rubs, + split S2. No clubbing,  cyanosis, edema.  Radials/DP/PT 2+ and equal bilaterally.  Respiratory:  Respirations regular and unlabored, clear to auscultation bilaterally. GI: Soft, nontender, nondistended, BS + x 4. MS: no deformity or atrophy. Skin: warm and dry, no rash. Neuro:  Strength and sensation are intact. Psych: AAOx3.  Normal affect.  Labs    CBC  Recent Labs  03/28/16 2050  WBC 11.5*  HGB 13.0  HCT 38.8*  MCV 93.3  PLT 998   Basic Metabolic Panel  Recent Labs  03/29/16 0704 03/30/16 0804  NA 140 136  K 4.0 4.1  CL 106 102  CO2 26 24  GLUCOSE 147* 166*  BUN 30* 33*  CREATININE 1.78* 2.07*  CALCIUM 9.3 9.1   Liver Function Tests  Recent Labs  03/29/16 0704  AST 36  ALT 50  ALKPHOS 83  BILITOT 1.2  PROT 7.7  ALBUMIN 3.8   No results for input(s): LIPASE, AMYLASE in the last 72 hours. Cardiac Enzymes  Recent Labs  03/29/16 0704 03/29/16 1206 03/29/16 1805  TROPONINI 0.03* <0.03 0.03*      Recent Labs  03/29/16 0704  TSH  9.461*    Telemetry    NSR, HR in 60's - 70's. - Personally Reviewed  ECG    NSR, HR 61, with known RBBB - Personally Reviewed  Radiology    Dg Chest 2 View  Result Date: 03/28/2016 CLINICAL DATA:  Left-sided chest pain with shortness of Breath EXAM: CHEST  2 VIEW COMPARISON:  04/08/2011 FINDINGS: Cardiac shadow is mildly enlarged. The lungs are well aerated bilaterally. Mild fullness of the central vasculature is noted without pulmonary edema. No other focal abnormality is seen. IMPRESSION: Mild vascular congestion without pulmonary edema Electronically Signed   By: Inez Catalina M.D.   On: 03/28/2016 21:03    Cardiac Studies   Echocardiogram: 03/29/2016 Study Conclusions  - Left ventricle: The cavity size was normal. There was moderate   concentric and severe asymmetric hypertrophy. Systolic function   was normal. The estimated ejection fraction was in the range of   60% to 65%. Wall motion was normal; there were no regional  wall   motion abnormalities. Features are consistent with a pseudonormal   left ventricular filling pattern, with concomitant abnormal   relaxation and increased filling pressure (grade 2 diastolic   dysfunction). Doppler parameters are consistent with high   ventricular filling pressure. - Pulmonic valve: There was trivial regurgitation.  Patient Profile     60 yo male w/ PMH of HTN, Type 2 DM, and known RBBB who presented to [facility] ED on 10/17 for chest discomfort and dyspnea. Found to be in hypertensive urgency with BP of 236/125.  Assessment & Plan    1. Chest pain/hypertensive urgency - presented with episodes of chest discomfort lasting for 30 minutes to 1 hour then resolving spontaneously. No exertional component noted.  - EKG without acute ischemic changes and cyclic troponin values flat at 0.03. Echo shows preserved EF of 60-65% with no wall motion abnormalities.  Would anticipate nuclear stress testing once BP improves.  - BNP elevated to 658 on admission, likely secondary to atrial stretch in the setting of hypertensive urgency. Hold Lasix for now with rising creatinine.  - continue PTA Losartan 100mg  daily and Hydralazine 100mg  TID. Atenolol switched to Coreg 25mg  BID. IV NTG stopped last night but with most recent BP of 238/114, was resumed this AM (titrating to 18mcg/min at the time of this encounter). Has PRN Hydralazine as well.  2. Right bundle branch block  - Chronic, no change from 2012 EKG.  3. Mildly elevated WPYKDXIP-3.82 - cyclic troponin values have been flat at 0.03 this admission, likely secondary to demand ischemia in the setting of hypertensive urgency. - echo this admission shows preserved EF of 60-65% with no wall motion abnormalities.   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   Signed, Erma Heritage, PA  03/30/2016, 11:23 AM   Personally seen and examined. Agree with above. BP once again elevated despite  diuresis Will add clonidine 0.1 TID Holding lasix with rise in creat.  CTAB, RRR  Candee Furbish, MD

## 2016-03-31 DIAGNOSIS — I248 Other forms of acute ischemic heart disease: Secondary | ICD-10-CM

## 2016-03-31 LAB — CBC
HEMATOCRIT: 33.7 % — AB (ref 39.0–52.0)
Hemoglobin: 11.3 g/dL — ABNORMAL LOW (ref 13.0–17.0)
MCH: 31.3 pg (ref 26.0–34.0)
MCHC: 33.5 g/dL (ref 30.0–36.0)
MCV: 93.4 fL (ref 78.0–100.0)
PLATELETS: 215 10*3/uL (ref 150–400)
RBC: 3.61 MIL/uL — ABNORMAL LOW (ref 4.22–5.81)
RDW: 13.9 % (ref 11.5–15.5)
WBC: 11.8 10*3/uL — ABNORMAL HIGH (ref 4.0–10.5)

## 2016-03-31 LAB — BASIC METABOLIC PANEL
Anion gap: 8 (ref 5–15)
BUN: 37 mg/dL — AB (ref 6–20)
CALCIUM: 8.6 mg/dL — AB (ref 8.9–10.3)
CO2: 25 mmol/L (ref 22–32)
Chloride: 103 mmol/L (ref 101–111)
Creatinine, Ser: 2.24 mg/dL — ABNORMAL HIGH (ref 0.61–1.24)
GFR calc Af Amer: 35 mL/min — ABNORMAL LOW (ref >60)
GFR, EST NON AFRICAN AMERICAN: 30 mL/min — AB (ref >60)
GLUCOSE: 168 mg/dL — AB (ref 65–99)
Potassium: 3.4 mmol/L — ABNORMAL LOW (ref 3.5–5.1)
Sodium: 136 mmol/L (ref 135–145)

## 2016-03-31 LAB — GLUCOSE, CAPILLARY
Glucose-Capillary: 117 mg/dL — ABNORMAL HIGH (ref 65–99)
Glucose-Capillary: 151 mg/dL — ABNORMAL HIGH (ref 65–99)
Glucose-Capillary: 198 mg/dL — ABNORMAL HIGH (ref 65–99)
Glucose-Capillary: 113 mg/dL — ABNORMAL HIGH (ref 65–99)

## 2016-03-31 LAB — BRAIN NATRIURETIC PEPTIDE: B Natriuretic Peptide: 201.3 pg/mL — ABNORMAL HIGH (ref 0.0–100.0)

## 2016-03-31 MED ORDER — CLINDAMYCIN HCL 300 MG PO CAPS
300.0000 mg | ORAL_CAPSULE | Freq: Three times a day (TID) | ORAL | Status: DC
  Administered 2016-03-31 – 2016-04-02 (×7): 300 mg via ORAL
  Filled 2016-03-31 (×8): qty 1

## 2016-03-31 MED ORDER — CLONIDINE HCL 0.1 MG PO TABS
0.1000 mg | ORAL_TABLET | Freq: Once | ORAL | Status: AC
  Administered 2016-03-31: 0.1 mg via ORAL
  Filled 2016-03-31: qty 1

## 2016-03-31 MED ORDER — SACCHAROMYCES BOULARDII 250 MG PO CAPS
250.0000 mg | ORAL_CAPSULE | Freq: Two times a day (BID) | ORAL | Status: DC
  Administered 2016-03-31 – 2016-04-02 (×5): 250 mg via ORAL
  Filled 2016-03-31 (×5): qty 1

## 2016-03-31 MED ORDER — CLONIDINE HCL 0.1 MG PO TABS
0.2000 mg | ORAL_TABLET | Freq: Three times a day (TID) | ORAL | Status: DC
  Administered 2016-03-31 (×2): 0.2 mg via ORAL
  Filled 2016-03-31 (×2): qty 2

## 2016-03-31 NOTE — Progress Notes (Signed)
Patient Name: John Jacobson Date of Encounter: 03/31/2016  Primary Cardiologist: Dr. Talmadge Chad Problem List     Principal Problem:   Hypertensive urgency Active Problems:   Chest pain   DM (diabetes mellitus), type 2 with renal complications ([place])   ARF (acute renal failure) (HCC)    Subjective   Denies any chest discomfort or palpitations. Still with significant oral pain, saying he was prescribed antibiotics by his dentist but never had this Rx filled.   Inpatient Medications    Scheduled Meds: . amLODipine  10 mg Oral Daily  . aspirin EC  81 mg Oral Daily  . carvedilol  25 mg Oral BID WC  . cloNIDine  0.1 mg Oral TID  . glipiZIDE  10 mg Oral QAC breakfast  . hydrALAZINE  100 mg Oral TID  . insulin aspart  0-15 Units Subcutaneous TID WC  . insulin glargine  12 Units Subcutaneous QHS  . losartan  100 mg Oral Daily   Continuous Infusions: . nitroGLYCERIN 20 mcg/min (03/30/16 2000)   PRN Meds: acetaminophen **OR** acetaminophen, diphenhydrAMINE, hydrALAZINE, morphine injection, ondansetron **OR** ondansetron (ZOFRAN) IV, oxyCODONE, sodium chloride   Vital Signs    Vitals:   03/31/16 0300 03/31/16 0400 03/31/16 0600 03/31/16 0800  BP: (!) 143/71 123/70 124/60   Pulse:      Resp: 13 13    Temp:  98.8 F (37.1 C)  98.3 F (36.8 C)  TempSrc:  Axillary  Oral  SpO2: 93% 94% 95%   Weight:      Height:        Intake/Output Summary (Last 24 hours) at 03/31/16 0841 Last data filed at 03/30/16 2200  Gross per 24 hour  Intake           581.43 ml  Output             2550 ml  Net         -1968.57 ml   Filed Weights   03/28/16 2001 03/30/16 0500  Weight: 168 lb (76.2 kg) 166 lb 7.2 oz (75.5 kg)    Physical Exam   GEN: Well nourished, well developed, African American male appearing in no acute distress.  HEENT: Grossly normal.  Neck: Supple, no JVD, carotid bruits, or masses. Cardiac: RRR, no murmurs, rubs, + split S2. No clubbing, cyanosis, edema.   Radials/DP/PT 2+ and equal bilaterally.  Respiratory:  Respirations regular and unlabored, clear to auscultation bilaterally. GI: Soft, nontender, nondistended, BS + x 4. MS: no deformity or atrophy. Skin: warm and dry, no rash. Neuro:  Strength and sensation are intact. Psych: AAOx3.  Normal affect.  Labs    CBC  Recent Labs  03/28/16 2050 03/31/16 0329  WBC 11.5* 11.8*  HGB 13.0 11.3*  HCT 38.8* 33.7*  MCV 93.3 93.4  PLT 219 761   Basic Metabolic Panel  Recent Labs  03/30/16 0804 03/31/16 0329  NA 136 136  K 4.1 3.4*  CL 102 103  CO2 24 25  GLUCOSE 166* 168*  BUN 33* 37*  CREATININE 2.07* 2.24*  CALCIUM 9.1 8.6*   Liver Function Tests  Recent Labs  03/29/16 0704  AST 36  ALT 50  ALKPHOS 83  BILITOT 1.2  PROT 7.7  ALBUMIN 3.8   No results for input(s): LIPASE, AMYLASE in the last 72 hours. Cardiac Enzymes  Recent Labs  03/29/16 0704 03/29/16 1206 03/29/16 1805  TROPONINI 0.03* <0.03 0.03*      Recent Labs  03/29/16 9509  TSH 9.461*    Telemetry    NSR, HR in 70's. No atopic events.  - Personally Reviewed  ECG    NSR, HR 61, with known RBBB - Personally Reviewed  Radiology    Dg Chest 2 View  Result Date: 03/28/2016 CLINICAL DATA:  Left-sided chest pain with shortness of Breath EXAM: CHEST  2 VIEW COMPARISON:  04/08/2011 FINDINGS: Cardiac shadow is mildly enlarged. The lungs are well aerated bilaterally. Mild fullness of the central vasculature is noted without pulmonary edema. No other focal abnormality is seen. IMPRESSION: Mild vascular congestion without pulmonary edema Electronically Signed   By: Inez Catalina M.D.   On: 03/28/2016 21:03    Cardiac Studies   Echocardiogram: 03/29/2016 Study Conclusions  - Left ventricle: The cavity size was normal. There was moderate   concentric and severe asymmetric hypertrophy. Systolic function   was normal. The estimated ejection fraction was in the range of   60% to 65%. Wall motion  was normal; there were no regional wall   motion abnormalities. Features are consistent with a pseudonormal   left ventricular filling pattern, with concomitant abnormal   relaxation and increased filling pressure (grade 2 diastolic   dysfunction). Doppler parameters are consistent with high   ventricular filling pressure. - Pulmonic valve: There was trivial regurgitation.  Patient Profile     60 yo male w/ PMH of HTN, Type 2 DM, and known RBBB who presented to [facility] ED on 10/17 for chest discomfort and dyspnea. Found to be in hypertensive urgency with BP of 236/125.  Assessment & Plan    1. Chest pain/hypertensive urgency - presented with episodes of chest discomfort lasting for 30 minutes to 1 hour then resolving spontaneously. No exertional component noted.  - EKG without acute ischemic changes and cyclic troponin values flat at 0.03. Echo shows preserved EF of 60-65% with no wall motion abnormalities.  Would anticipate nuclear stress testing once BP improves.  - BNP elevated to 658 on admission, likely secondary to atrial stretch in the setting of hypertensive urgency. Hold Lasix for now with rising creatinine.  - continue PTA Losartan 100mg  daily and Hydralazine 100mg  TID. Atenolol switched to Coreg 25mg  BID. Has been started on Clonidine 0.1mg  TID. NTG drip currently at 20 mcg/min. Discussed with nursing staff, will decrease NTG drip to 56mcg/min with administration of morning medications and wean as BP allows. Can titrate Clonidine later today or tomorrow pending BP response.   2. Right bundle branch block  - Chronic, no change from 2012 EKG.  3. Mildly elevated DHRCBULA-4.53 - cyclic troponin values have been flat at 0.03 this admission, likely secondary to demand ischemia in the setting of hypertensive urgency. - echo this admission shows preserved EF of 60-65% with no wall motion abnormalities.   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  '  5. Oral Infection? - patient reports being diagnosed with an infection last week by his dentist and being started on antibiotics but never took these.  - per admitting team.  Signed, Erma Heritage, PA  03/31/2016, 8:41 AM   Personally seen and examined. Agree with above. HTN improved with clonidine.  Turned off NTG IV - agree Increase clonidine to 0.2 TID Lungs clear. Alert  Candee Furbish, MD

## 2016-03-31 NOTE — Progress Notes (Addendum)
[facility] PROGRESS NOTE  John Jacobson KGM:[id number] DOB: 05-07-1956 DOA: 03/28/2016  PCP: No primary care provider on file.  Brief History/Interval Summary: 60 year old African-American male with a past medical history of hypertension, diabetes, presented with complaints of chest pain and shortness of breath. Symptoms had been ongoing for 3 days. Patient gets most of his care at the [place].  Reason for Visit: Malignant hypertension  Consultants: Cardiology  Procedures:  Transthoracic echocardiogram Study Conclusions  - Left ventricle: The cavity size was normal. There was moderate   concentric and severe asymmetric hypertrophy. Systolic function   was normal. The estimated ejection fraction was in the range of   60% to 65%. Wall motion was normal; there were no regional wall   motion abnormalities. Features are consistent with a pseudonormal   left ventricular filling pattern, with concomitant abnormal   relaxation and increased filling pressure (grade 2 diastolic   dysfunction). Doppler parameters are consistent with high   ventricular filling pressure. - Pulmonic valve: There was trivial regurgitation.   Antibiotics: None  Subjective/Interval History: Patient denies any chest pain or shortness of breath today. He states that he is actually feeling better. Tooth pain persists but is stable.   ROS: Denies any nausea or vomiting.  Objective:  Vital Signs  Vitals:   03/31/16 0200 03/31/16 0300 03/31/16 0400 03/31/16 0600  BP: 130/63 (!) 143/71 123/70 124/60  Pulse:      Resp: 12 13 13    Temp:   98.8 F (37.1 C)   TempSrc:   Axillary   SpO2: 93% 93% 94% 95%  Weight:      Height:        Intake/Output Summary (Last 24 hours) at 03/31/16 0748 Last data filed at 03/30/16 2200  Gross per 24 hour  Intake           581.43 ml  Output             3450 ml  Net         -2868.57 ml   Filed Weights   03/28/16 2001 03/30/16 0500  Weight: 76.2 kg (168 lb) 75.5  kg (166 lb 7.2 oz)    General appearance: alert, cooperative, appears stated age and no distress Resp: Improved air entry bilaterally. No crackles, wheezing or rhonchi heard today. Cardio: regular rate and rhythm, S1, S2 normal, no murmur, click, rub or gallop GI: soft, non-tender; bowel sounds normal; no masses,  no organomegaly Extremities: extremities normal, atraumatic, no cyanosis or edema Neurologic: Awake and alert. Oriented 3. No focal neurological deficits.  Lab Results:  Data Reviewed: I have personally reviewed following labs and imaging studies  CBC:  Recent Labs Lab 03/28/16 2050 03/31/16 0329  WBC 11.5* 11.8*  HGB 13.0 11.3*  HCT 38.8* 33.7*  MCV 93.3 93.4  PLT 219 644    Basic Metabolic Panel:  Recent Labs Lab 03/28/16 2050 03/29/16 0704 03/30/16 0804 03/31/16 0329  NA 141 140 136 136  K 4.5 4.0 4.1 3.4*  CL 108 106 102 103  CO2 25 26 24 25   GLUCOSE 122* 147* 166* 168*  BUN 31* 30* 33* 37*  CREATININE 1.90* 1.78* 2.07* 2.24*  CALCIUM 9.1 9.3 9.1 8.6*    GFR: Estimated Creatinine Clearance: 32.4 mL/min (by C-G formula based on SCr of 2.24 mg/dL (H)).  Liver Function Tests:  Recent Labs Lab 03/29/16 0704  AST 36  ALT 50  ALKPHOS 83  BILITOT 1.2  PROT 7.7  ALBUMIN 3.8  Cardiac Enzymes:  Recent Labs Lab 03/29/16 0704 03/29/16 1206 03/29/16 1805  TROPONINI 0.03* <0.03 0.03*    CBG:  Recent Labs Lab 03/29/16 2224 03/30/16 0744 03/30/16 1158 03/30/16 1726 03/30/16 2123  GLUCAP 204* 144* 135* 176* 128*    Thyroid Function Tests:  Recent Labs  03/29/16 0704 03/30/16 0804  TSH 9.461*  --   FREET4  --  1.26*     Recent Results (from the past 240 hour(s))  MRSA PCR Screening     Status: None   Collection Time: 03/29/16  6:24 AM  Result Value Ref Range Status   MRSA by PCR NEGATIVE NEGATIVE Final    Comment:        The GeneXpert MRSA Assay (FDA approved for NASAL specimens only), is one component of  a comprehensive MRSA colonization surveillance program. It is not intended to diagnose MRSA infection nor to guide or monitor treatment for MRSA infections.       Radiology Studies: US Renal  Result Date: 03/30/2016 CLINICAL DATA:  Acute renal failure. EXAM: RENAL / URINARY TRACT ULTRASOUND COMPLETE COMPARISON:  No recent prior. FINDINGS: Right Kidney: Length: 11.1 cm. Echogenicity within normal limits. No mass or hydronephrosis visualized. Left Kidney: Length: 12.3 cm. Echogenicity within normal limits. No mass or hydronephrosis visualized. Bladder: Appears normal for degree of bladder distention. IMPRESSION: No acute or focal abnormality identified. No evidence of hydronephrosis. No bladder distention. Electronically Signed   By: Felida   On: 03/30/2016 16:00     Medications:  Scheduled: . amLODipine  10 mg Oral Daily  . aspirin EC  81 mg Oral Daily  . carvedilol  25 mg Oral BID WC  . cloNIDine  0.1 mg Oral TID  . glipiZIDE  10 mg Oral QAC breakfast  . hydrALAZINE  100 mg Oral TID  . insulin aspart  0-15 Units Subcutaneous TID WC  . insulin glargine  12 Units Subcutaneous QHS  . losartan  100 mg Oral Daily   Continuous: . 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 Patient was placed on IV nitroglycerin, which was tapered to off, however, had to be resumed on 10/18. Blood pressures are better controlled this morning. I discussed with the nursing staff. They will continue to wean him off of nitroglycerin. Patient was started on clonidine by cardiology  yesterday, but seems to be helping. May need to further adjust his dose. Also on carvedilol, hydralazine, losartan and amlodipine. These will be continued for now. Patient has had long-standing hypertension. Unclear if he has been compliant with his medication regimen. Old records reviewed. He's always had significantly elevated blood pressures. In 1478, his systolic blood pressure was 181, when he was seen by cardiology and their office. When he was seen by his primary care provider last week, his systolic was in the 295A. At that time, he was started on oral hydralazine. Etiology for his hypertension is unclear. Could be primary but there could also be secondary issues. He appears to have chronic kidney disease. Renal ultrasound does show unequal kidneys. However, the difference is less than 1.5 cm. Patient will likely need outpatient workup to look for secondary causes of  hypertension.   Acute on chronic kidney disease, possibly stage III According to records, the patient showed me, it appears that he has been diagnosed with chronic kidney disease. Baseline renal function is not known, however. Renal ultrasound report reviewed. No evidence for hydronephrosis. Patient continues to have urine output, however, his creatinine continues to climb. Lasix was discontinued. Avoid other nephrotoxic agents. Continue to trend creatinine for now. Anticipate it will stabilize soon. If it continues to climb, we may have to discontinue his Cozaar. Cautiously replace potassium.  Acute on chronic Diastolic CHF Echocardiogram does show diastolic dysfunction. Patient was given Lasix with good diuresis. He states that his lower extremity edema has improved. However, his creatinine continues to climb. Lasix were discontinued on 10/18. Continue to monitor ins and outs. Continue fluid restriction. Daily weights.   Chest pain. Most likely secondary to elevated blood pressure. Cardiology is following and to determine further  management. Continue aspirin.  Diabetes mellitus type 2. Continue with Lantus. Sliding scale insulin coverage. HbA1c is pending. CBGs are reasonably well controlled.  Abnormal thyroid function tests. TSH is noted to be elevated. Free T4, however, also noted to be slightly related. Would recommend repeating these tests in a few weeks in the outpatient setting.  Tooth pain Patient apparently seen by an oral surgeon in the outpatient setting. Plan is for removal of teeth when his blood pressure is well controlled. Have explained to the patient that oral surgery does not come in to the hospital. This will have to be addressed in the outpatient setting. Could consider initiating clindamycin.   DVT Prophylaxis: SCDs    Code Status: Full code  Family Communication: Discussed with the patient. No family at bedside  Disposition Plan: Await improvement in blood pressure.   LOS: 2 days   [facility] Pager [phone] 03/31/2016, 7:48 AM  If 7PM-7AM, please contact night-coverage at [email or web address], password [facility]

## 2016-04-01 LAB — BASIC METABOLIC PANEL
Anion gap: 9 (ref 5–15)
BUN: 42 mg/dL — AB (ref 6–20)
CO2: 27 mmol/L (ref 22–32)
CREATININE: 2.46 mg/dL — AB (ref 0.61–1.24)
Calcium: 8.8 mg/dL — ABNORMAL LOW (ref 8.9–10.3)
Chloride: 100 mmol/L — ABNORMAL LOW (ref 101–111)
GFR calc Af Amer: 31 mL/min — ABNORMAL LOW (ref >60)
GFR, EST NON AFRICAN AMERICAN: 27 mL/min — AB (ref >60)
Glucose, Bld: 101 mg/dL — ABNORMAL HIGH (ref 65–99)
Potassium: 4.1 mmol/L (ref 3.5–5.1)
SODIUM: 136 mmol/L (ref 135–145)

## 2016-04-01 LAB — CBC
HCT: 33.5 % — ABNORMAL LOW (ref 39.0–52.0)
Hemoglobin: 11.4 g/dL — ABNORMAL LOW (ref 13.0–17.0)
MCH: 31.6 pg (ref 26.0–34.0)
MCHC: 34 g/dL (ref 30.0–36.0)
MCV: 92.8 fL (ref 78.0–100.0)
PLATELETS: 212 10*3/uL (ref 150–400)
RBC: 3.61 MIL/uL — ABNORMAL LOW (ref 4.22–5.81)
RDW: 13.8 % (ref 11.5–15.5)
WBC: 12.6 10*3/uL — ABNORMAL HIGH (ref 4.0–10.5)

## 2016-04-01 LAB — HEMOGLOBIN A1C
Hgb A1c MFr Bld: 7.2 % — ABNORMAL HIGH (ref 4.8–5.6)
MEAN PLASMA GLUCOSE: 160 mg/dL

## 2016-04-01 LAB — GLUCOSE, CAPILLARY
GLUCOSE-CAPILLARY: 74 mg/dL (ref 65–99)
Glucose-Capillary: 111 mg/dL — ABNORMAL HIGH (ref 65–99)
Glucose-Capillary: 139 mg/dL — ABNORMAL HIGH (ref 65–99)
Glucose-Capillary: 181 mg/dL — ABNORMAL HIGH (ref 65–99)

## 2016-04-01 MED ORDER — POLYETHYLENE GLYCOL 3350 17 G PO PACK
17.0000 g | PACK | Freq: Every day | ORAL | Status: DC
  Administered 2016-04-01 – 2016-04-02 (×2): 17 g via ORAL
  Filled 2016-04-01 (×2): qty 1

## 2016-04-01 MED ORDER — SENNA 8.6 MG PO TABS
1.0000 | ORAL_TABLET | Freq: Every day | ORAL | Status: DC
  Administered 2016-04-01 – 2016-04-02 (×2): 8.6 mg via ORAL
  Filled 2016-04-01 (×2): qty 1

## 2016-04-01 MED ORDER — CLONIDINE HCL 0.2 MG PO TABS
0.3000 mg | ORAL_TABLET | Freq: Three times a day (TID) | ORAL | Status: DC
  Administered 2016-04-01 – 2016-04-02 (×4): 0.3 mg via ORAL
  Filled 2016-04-01 (×4): qty 1

## 2016-04-01 NOTE — Progress Notes (Signed)
Patient Name: John Jacobson Date of Encounter: 04/01/2016  Primary Cardiologist: Dr. Talmadge Chad Problem List     Principal Problem:   Hypertensive urgency Active Problems:   Chest pain   DM (diabetes mellitus), type 2 with renal complications ([place])   ARF (acute renal failure) (HCC)   Demand ischemia (HCC)    Subjective   Denies any chest discomfort or palpitations. Doing better. Getting transferred to floor.  Inpatient Medications    Scheduled Meds: . amLODipine  10 mg Oral Daily  . aspirin EC  81 mg Oral Daily  . carvedilol  25 mg Oral BID WC  . clindamycin  300 mg Oral Q8H  . cloNIDine  0.2 mg Oral TID  . glipiZIDE  10 mg Oral QAC breakfast  . hydrALAZINE  100 mg Oral TID  . insulin aspart  0-15 Units Subcutaneous TID WC  . insulin glargine  12 Units Subcutaneous QHS  . losartan  100 mg Oral Daily  . polyethylene glycol  17 g Oral Daily  . saccharomyces boulardii  250 mg Oral BID  . senna  1 tablet Oral Daily   Continuous Infusions: . nitroGLYCERIN Stopped (03/31/16 1055)   PRN Meds: acetaminophen **OR** acetaminophen, diphenhydrAMINE, hydrALAZINE, morphine injection, ondansetron **OR** ondansetron (ZOFRAN) IV, oxyCODONE, sodium chloride   Vital Signs    Vitals:   04/01/16 0600 04/01/16 0700 04/01/16 0752 04/01/16 0800  BP: (!) 149/79 137/72 137/72 (!) 163/84  Pulse:   (!) 55   Resp: 12 14  (!) 9  Temp:      TempSrc:      SpO2: 97% 96%  97%  Weight:      Height:        Intake/Output Summary (Last 24 hours) at 04/01/16 0911 Last data filed at 04/01/16 0800  Gross per 24 hour  Intake            127.5 ml  Output              735 ml  Net           -607.5 ml   Filed Weights   03/28/16 2001 03/30/16 0500 04/01/16 0429  Weight: 168 lb (76.2 kg) 166 lb 7.2 oz (75.5 kg) 164 lb 7.4 oz (74.6 kg)    Physical Exam   GEN: Well nourished, well developed, African American male appearing in no acute distress.  HEENT: Grossly normal.  Neck:  Supple, no JVD, carotid bruits, or masses. Cardiac: RRR, no murmurs, rubs, + split S2. No clubbing, cyanosis, edema.  Radials/DP/PT 2+ and equal bilaterally.  Respiratory:  Respirations regular and unlabored, clear to auscultation bilaterally. GI: Soft, nontender, nondistended, BS + x 4. MS: no deformity or atrophy. Skin: warm and dry, no rash. Neuro:  Strength and sensation are intact. Psych: AAOx3.  Normal affect.  Labs    CBC  Recent Labs  03/31/16 0329 04/01/16 0329  WBC 11.8* 12.6*  HGB 11.3* 11.4*  HCT 33.7* 33.5*  MCV 93.4 92.8  PLT 215 646   Basic Metabolic Panel  Recent Labs  03/31/16 0329 04/01/16 0329  NA 136 136  K 3.4* 4.1  CL 103 100*  CO2 25 27  GLUCOSE 168* 101*  BUN 37* 42*  CREATININE 2.24* 2.46*  CALCIUM 8.6* 8.8*   Liver Function Tests No results for input(s): AST, ALT, ALKPHOS, BILITOT, PROT, ALBUMIN in the last 72 hours. No results for input(s): LIPASE, AMYLASE in the last 72 hours. Cardiac Enzymes  Recent Labs  03/29/16 1206 03/29/16 1805  TROPONINI <0.03 0.03*     No results for input(s): TSH, T4TOTAL, T3FREE, THYROIDAB in the last 72 hours.  Invalid input(s): FREET3  Telemetry    NSR, HR in 70's. No atopic events.  - Personally Reviewed  ECG    NSR, HR 61, with known RBBB - Personally Reviewed  Radiology    Dg Chest 2 View  Result Date: 03/28/2016 CLINICAL DATA:  Left-sided chest pain with shortness of Breath EXAM: CHEST  2 VIEW COMPARISON:  04/08/2011 FINDINGS: Cardiac shadow is mildly enlarged. The lungs are well aerated bilaterally. Mild fullness of the central vasculature is noted without pulmonary edema. No other focal abnormality is seen. IMPRESSION: Mild vascular congestion without pulmonary edema Electronically Signed   By: Inez Catalina M.D.   On: 03/28/2016 21:03    Cardiac Studies   Echocardiogram: 03/29/2016 Study Conclusions  - Left ventricle: The cavity size was normal. There was moderate   concentric  and severe asymmetric hypertrophy. Systolic function   was normal. The estimated ejection fraction was in the range of   60% to 65%. Wall motion was normal; there were no regional wall   motion abnormalities. Features are consistent with a pseudonormal   left ventricular filling pattern, with concomitant abnormal   relaxation and increased filling pressure (grade 2 diastolic   dysfunction). Doppler parameters are consistent with high   ventricular filling pressure. - Pulmonic valve: There was trivial regurgitation.  Patient Profile     60 yo male w/ PMH of HTN, Type 2 DM, and known RBBB who presented to [facility] ED on 10/17 for chest discomfort and dyspnea. Found to be in hypertensive urgency with BP of 236/125.  Assessment & Plan    1. Chest pain/hypertensive urgency - presented with episodes of chest discomfort lasting for 30 minutes to 1 hour then resolving spontaneously. No exertional component noted.  - EKG without acute ischemic changes and cyclic troponin values flat at 0.03. Echo shows preserved EF of 60-65% with no wall motion abnormalities. Consider nuclear stress testing as outpatient.  - BNP elevated to 658 on admission, likely secondary to atrial stretch in the setting of hypertensive urgency. Holding Lasix for now with rising creatinine.  - continue PTA Losartan 100mg  daily (must primary team wishes to hold given rise in creatinine) and Hydralazine 100mg  TID. Atenolol switched to Coreg 25mg  BID. Has been started on Clonidine which I will increase to 0.3mg  TID.  2. Right bundle branch block  - Chronic, no change from 2012 EKG.  3. Mildly elevated DDUKGURK-2.70 - cyclic troponin values have been flat at 0.03 this admission, likely secondary to demand ischemia in the setting of hypertensive urgency. - echo this admission shows preserved EF of 60-65% with no wall motion abnormalities.   4. 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 Had received IV Lasix previously. This is currently stopped. He is on losartan as he was taking at home. I will defer to primary team on whether or not to hold this medication.  5. Oral Infection? - patient reports being diagnosed with an infection last week by his dentist and being started on antibiotics but never took these.  - per admitting team.  No further cardiology recommendations at this time. Continue to optimize blood pressure control. Clonidine has been increased today to 0.3 3 times a day.  We will sign off, please let us know if we can be of  further assistance.  Signed, Candee Furbish, MD  04/01/2016, 9:11 AM

## 2016-04-01 NOTE — Progress Notes (Signed)
[facility] PROGRESS NOTE  John Jacobson:[id number] DOB: Aug 22, 1955 DOA: 03/28/2016  PCP: No primary care provider on file.  Brief History/Interval Summary: 60 year old African-American male with a past medical history of hypertension, diabetes, presented with complaints of chest pain and shortness of breath. Symptoms had been ongoing for 3 days. Patient gets most of his care at the [place].  Reason for Visit: Malignant hypertension  Consultants: Cardiology  Procedures:  Transthoracic echocardiogram Study Conclusions  - Left ventricle: The cavity size was normal. There was moderate   concentric and severe asymmetric hypertrophy. Systolic function   was normal. The estimated ejection fraction was in the range of   60% to 65%. Wall motion was normal; there were no regional wall   motion abnormalities. Features are consistent with a pseudonormal   left ventricular filling pattern, with concomitant abnormal   relaxation and increased filling pressure (grade 2 diastolic   dysfunction). Doppler parameters are consistent with high   ventricular filling pressure. - Pulmonic valve: There was trivial regurgitation.   Antibiotics: None  Subjective/Interval History: Patient feels well overall. States that he is making adequate amount of urine. Still concerned about his tooth pain. Denies any chest pain or shortness of breath.    ROS: Denies any nausea or vomiting.  Objective:  Vital Signs  Vitals:   04/01/16 0429 04/01/16 0500 04/01/16 0600 04/01/16 0700  BP:  138/71 (!) 149/79 137/72  Pulse:      Resp:  15 12 14   Temp: 98.2 F (36.8 C)     TempSrc: Oral     SpO2:  95% 97% 96%  Weight: 74.6 kg (164 lb 7.4 oz)     Height:        Intake/Output Summary (Last 24 hours) at 04/01/16 0729 Last data filed at 04/01/16 0500  Gross per 24 hour  Intake             73.2 ml  Output              585 ml  Net           -511.8 ml   Filed Weights   03/28/16 2001 03/30/16 0500  04/01/16 0429  Weight: 76.2 kg (168 lb) 75.5 kg (166 lb 7.2 oz) 74.6 kg (164 lb 7.4 oz)    General appearance: alert, cooperative, appears stated age and no distress Resp: Improved air entry bilaterally. No wheezing or rhonchi. Few crackles at the bases which diminish with deep breathing. Cardio: regular rate and rhythm, S1, S2 normal, no murmur, click, rub or gallop GI: soft, non-tender; bowel sounds normal; no masses,  no organomegaly Extremities: extremities normal, atraumatic, no cyanosis or edema Neurologic: Awake and alert. Oriented 3. No focal neurological deficits.  Lab Results:  Data Reviewed: I have personally reviewed following labs and imaging studies  CBC:  Recent Labs Lab 03/28/16 2050 03/31/16 0329 04/01/16 0329  WBC 11.5* 11.8* 12.6*  HGB 13.0 11.3* 11.4*  HCT 38.8* 33.7* 33.5*  MCV 93.3 93.4 92.8  PLT 219 215 078    Basic Metabolic Panel:  Recent Labs Lab 03/28/16 2050 03/29/16 0704 03/30/16 0804 03/31/16 0329 04/01/16 0329  NA 141 140 136 136 136  K 4.5 4.0 4.1 3.4* 4.1  CL 108 106 102 103 100*  CO2 25 26 24 25 27   GLUCOSE 122* 147* 166* 168* 101*  BUN 31* 30* 33* 37* 42*  CREATININE 1.90* 1.78* 2.07* 2.24* 2.46*  CALCIUM 9.1 9.3 9.1 8.6* 8.8*  GFR: Estimated Creatinine Clearance: 29.5 mL/min (by C-G formula based on SCr of 2.46 mg/dL (H)).  Liver Function Tests:  Recent Labs Lab 03/29/16 0704  AST 36  ALT 50  ALKPHOS 83  BILITOT 1.2  PROT 7.7  ALBUMIN 3.8   Cardiac Enzymes:  Recent Labs Lab 03/29/16 0704 03/29/16 1206 03/29/16 1805  TROPONINI 0.03* <0.03 0.03*    CBG:  Recent Labs Lab 03/30/16 2123 03/31/16 0758 03/31/16 1135 03/31/16 1626 03/31/16 2124  GLUCAP 128* 117* 151* 198* 113*    Thyroid Function Tests:  Recent Labs  03/30/16 0804  FREET4 1.26*     Recent Results (from the past 240 hour(s))  MRSA PCR Screening     Status: None   Collection Time: 03/29/16  6:24 AM  Result Value Ref Range  Status   MRSA by PCR NEGATIVE NEGATIVE Final    Comment:        The GeneXpert MRSA Assay (FDA approved for NASAL specimens only), is one component of a comprehensive MRSA colonization surveillance program. It is not intended to diagnose MRSA infection nor to guide or monitor treatment for MRSA infections.       Radiology Studies: US Renal  Result Date: 03/30/2016 CLINICAL DATA:  Acute renal failure. EXAM: RENAL / URINARY TRACT ULTRASOUND COMPLETE COMPARISON:  No recent prior. FINDINGS: Right Kidney: Length: 11.1 cm. Echogenicity within normal limits. No mass or hydronephrosis visualized. Left Kidney: Length: 12.3 cm. Echogenicity within normal limits. No mass or hydronephrosis visualized. Bladder: Appears normal for degree of bladder distention. IMPRESSION: No acute or focal abnormality identified. No evidence of hydronephrosis. No bladder distention. Electronically Signed   By: [place]   On: 03/30/2016 16:00     Medications:  Scheduled: . amLODipine  10 mg Oral Daily  . aspirin EC  81 mg Oral Daily  . carvedilol  25 mg Oral BID WC  . clindamycin  300 mg Oral Q8H  . cloNIDine  0.2 mg Oral TID  . glipiZIDE  10 mg Oral QAC breakfast  . hydrALAZINE  100 mg Oral TID  . insulin aspart  0-15 Units Subcutaneous TID WC  . insulin glargine  12 Units Subcutaneous QHS  . losartan  100 mg Oral Daily  . polyethylene glycol  17 g Oral Daily  . saccharomyces boulardii  250 mg Oral BID  . senna  1 tablet Oral Daily   Continuous: . nitroGLYCERIN Stopped (03/31/16 1055)   FUX:NATFTDDUKGURK **OR** acetaminophen, diphenhydrAMINE, hydrALAZINE, morphine injection, ondansetron **OR** ondansetron (ZOFRAN) IV, oxyCODONE, sodium chloride  Assessment/Plan:  Principal Problem:   Hypertensive urgency Active Problems:   Chest pain   DM (diabetes mellitus), type 2 with renal complications ([place])   ARF (acute renal failure) ([place])   Demand ischemia ([place])    Malignant hypertension  causing chest pain and headache. Patient was taken off of intravenous nitroglycerin on 10/19. He has been doing well. Blood pressures have been much better controlled. Patient's blood pressure seemed to have responded to the clonidine. Continue along with his other medications. Also on carvedilol, hydralazine, losartan and amlodipine. These will be continued for now. Patient has had long-standing hypertension. Unclear if he has been compliant with his medication regimen. Old records reviewed. He's always had significantly elevated blood pressures. In 2706, his systolic blood pressure was 181, when he was seen by cardiology and their office. When he was seen by his primary care provider last week, his systolic was in the 237S. At that time, he was started on oral  hydralazine. Etiology for his hypertension is unclear. Could be primary but there could also be secondary issues. He appears to have chronic kidney disease. Renal ultrasound does show unequal kidneys. However, the difference is less than 1.5 cm. Patient will likely need outpatient workup to look for secondary causes of hypertension.   Acute on chronic kidney disease, possibly stage III According to records, the patient showed me, it appears that he has been diagnosed with chronic kidney disease. Baseline renal function is not known, however. Renal ultrasound report reviewed. No evidence for hydronephrosis. Patient continues to have urine output, however, his creatinine continues to climb. Lasix was discontinued on 10/18. Avoid other nephrotoxic agents. Continue to trend creatinine for now. Anticipate it will stabilize soon. If it continues to climb, we may have to discontinue his Cozaar. Monitor urine output. States that he is supposed to see a nephrologist in the [place] system.  Acute on chronic Diastolic CHF Echocardiogram does show diastolic dysfunction. Patient was given Lasix with good diuresis. However, his creatinine continues to climb. Lasix were  discontinued on 10/18. Continue to monitor ins and outs. Continue fluid restriction. Daily weights. Weight continues to decrease.  Chest pain. Most likely secondary to elevated blood pressure. Cardiology is following and to determine further management. Continue aspirin.  Diabetes mellitus type 2. Continue with Lantus. Sliding scale insulin coverage. HbA1c is 7.2. CBGs are reasonably well controlled.  Abnormal thyroid function tests. TSH is noted to be elevated. Free T4, however, also noted to be slightly related. Would recommend repeating these tests in a few weeks in the outpatient setting.  Tooth pain Patient apparently seen by an oral surgeon in the outpatient setting. Plan is for removal of teeth when his blood pressure is well controlled. Have explained to the patient that oral surgery does not come in to the hospital. This will have to be addressed in the outpatient setting. Continue clindamycin.   DVT Prophylaxis: SCDs    Code Status: Full code  Family Communication: Discussed with the patient. No family at bedside  Disposition Plan: Improved. Okay for transfer to the floor. Mobilize. If renal function remains stable, he could be discharged tomorrow.   LOS: 3 days   [facility] Pager [phone] 04/01/2016, 7:29 AM  If 7PM-7AM, please contact night-coverage at [email or web address], password [email or web address]

## 2016-04-01 NOTE — Progress Notes (Signed)
Patient arrived to the unit. Telemetry applied and patient oriented to the room and equipment. Patient has no complaints of pain. No changes from previous assessment. Will continue to monitor.   John Jacobson [facility]

## 2016-04-02 LAB — BASIC METABOLIC PANEL
Anion gap: 8 (ref 5–15)
BUN: 44 mg/dL — AB (ref 6–20)
CALCIUM: 9 mg/dL (ref 8.9–10.3)
CO2: 25 mmol/L (ref 22–32)
Chloride: 104 mmol/L (ref 101–111)
Creatinine, Ser: 2.37 mg/dL — ABNORMAL HIGH (ref 0.61–1.24)
GFR calc Af Amer: 32 mL/min — ABNORMAL LOW (ref >60)
GFR, EST NON AFRICAN AMERICAN: 28 mL/min — AB (ref >60)
Glucose, Bld: 149 mg/dL — ABNORMAL HIGH (ref 65–99)
POTASSIUM: 4 mmol/L (ref 3.5–5.1)
SODIUM: 137 mmol/L (ref 135–145)

## 2016-04-02 LAB — GLUCOSE, CAPILLARY
Glucose-Capillary: 142 mg/dL — ABNORMAL HIGH (ref 65–99)
Glucose-Capillary: 192 mg/dL — ABNORMAL HIGH (ref 65–99)

## 2016-04-02 LAB — CBC
HCT: 34.1 % — ABNORMAL LOW (ref 39.0–52.0)
Hemoglobin: 11.4 g/dL — ABNORMAL LOW (ref 13.0–17.0)
MCH: 31 pg (ref 26.0–34.0)
MCHC: 33.4 g/dL (ref 30.0–36.0)
MCV: 92.7 fL (ref 78.0–100.0)
PLATELETS: 218 10*3/uL (ref 150–400)
RBC: 3.68 MIL/uL — AB (ref 4.22–5.81)
RDW: 13.7 % (ref 11.5–15.5)
WBC: 9.8 10*3/uL (ref 4.0–10.5)

## 2016-04-02 MED ORDER — CARVEDILOL 25 MG PO TABS: 25.0000 mg | ORAL_TABLET | Freq: Two times a day (BID) | ORAL | 1 refills | Status: DC

## 2016-04-02 MED ORDER — ASPIRIN 81 MG PO TBEC: 81.0000 mg | DELAYED_RELEASE_TABLET | Freq: Every day | ORAL | 1 refills | Status: DC

## 2016-04-02 MED ORDER — HYDRALAZINE HCL 100 MG PO TABS: 100.0000 mg | ORAL_TABLET | Freq: Three times a day (TID) | ORAL | 1 refills | Status: DC

## 2016-04-02 MED ORDER — AMLODIPINE BESYLATE 10 MG PO TABS: 10.0000 mg | ORAL_TABLET | Freq: Every day | ORAL | 1 refills | Status: DC

## 2016-04-02 MED ORDER — POLYETHYLENE GLYCOL 3350 17 G PO PACK: 17.0000 g | PACK | Freq: Every day | ORAL | 0 refills | Status: DC

## 2016-04-02 MED ORDER — CLINDAMYCIN HCL 300 MG PO CAPS: 300.0000 mg | ORAL_CAPSULE | Freq: Three times a day (TID) | ORAL | 0 refills | Status: DC

## 2016-04-02 MED ORDER — OXYCODONE HCL 5 MG PO TABS: 5.0000 mg | ORAL_TABLET | ORAL | 0 refills | Status: DC | PRN

## 2016-04-02 MED ORDER — CLONIDINE HCL 0.3 MG PO TABS: 0.3000 mg | ORAL_TABLET | Freq: Three times a day (TID) | ORAL | 1 refills | Status: DC

## 2016-04-02 MED ORDER — SACCHAROMYCES BOULARDII 250 MG PO CAPS: 250.0000 mg | ORAL_CAPSULE | Freq: Two times a day (BID) | ORAL | 0 refills | Status: DC

## 2016-04-02 MED ORDER — SENNA 8.6 MG PO TABS: 1.0000 | ORAL_TABLET | Freq: Every day | ORAL | 0 refills | Status: DC

## 2016-04-02 NOTE — Discharge Summary (Signed)
[facility]  Physician Discharge Summary   Patient ID: John Jacobson MRN: [id number] DOB/AGE: [birth year] 60 y.o.  Admit date: 03/28/2016 Discharge date: 04/02/2016  PCP: No primary care provider on file.  DISCHARGE DIAGNOSES:  Principal Problem:   Hypertensive urgency Active Problems:   Chest pain   DM (diabetes mellitus), type 2 with renal complications ([facility])   ARF (acute renal failure) ([facility])   Demand ischemia ([facility])   RECOMMENDATIONS FOR OUTPATIENT FOLLOW UP: Please follow up on following within a week: 1. Blood work to check renal function and to see if Lasix can be resumed 2. Check thyroid function in 3-4 weeks 3. Refer to a cardiologist (to consider stress test) and Nephrologist (for Chronic kidney disease) 4. Resume Coreg at a lower dose if HR has improved. 5. Consider evaluating for secondary causes of hypertension   DISCHARGE CONDITION: fair  Diet recommendation: Modified carbohydrate  Filed Weights   03/30/16 0500 04/01/16 0429 04/02/16 0544  Weight: 75.5 kg (166 lb 7.2 oz) 74.6 kg (164 lb 7.4 oz) 74.3 kg (163 lb 11.2 oz)    INITIAL HISTORY: 60 year old African-American male with a past medical history of hypertension, diabetes, presented with complaints of chest pain and shortness of breath. Symptoms had been ongoing for 3 days. Patient gets most of his care at the [place].  Consultants: Cardiology  Procedures:  Transthoracic echocardiogram Study Conclusions  - Left ventricle: The cavity size was normal. There was moderate concentric and severe asymmetric hypertrophy. Systolic function was normal. The estimated ejection fraction was in the range of 60% to 65%. Wall motion was normal; there were no regional wall motion abnormalities. Features are consistent with a pseudonormal left ventricular filling pattern, with concomitant abnormal relaxation and increased filling pressure (grade 2 diastolic dysfunction). Doppler parameters are  consistent with high ventricular filling pressure. 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 Patient was admitted to the stepdown unit. He was started on intravenous nitroglycerin. He was also started back on his home medications. Additional agents were added including hydralazine and clonidine. Dose of amlodipine was increased. Atenolol was changed to carvedilol. Subsequently, he was weaned off of nitroglycerin. Blood pressure significantly improved with addition of iodine. Heart rate, however, noted to be bradycardic, although he is asymptomatic. This is likely due to due to the effect of both the clonidine as well as Coreg. We will ask him to hold back on the Coreg for now as he has not received these in the hospital the last couple of times due to his low heart rate. He should have close follow-up with his PCP for further management of his high blood pressure. Patient has had long-standing hypertension. Unclear if he has been compliant with his medication regimen. Old records reviewed. He's always had significantly elevated blood pressures. In 2536, his systolic blood pressure was 181, when he was seen by cardiology and their office. Etiology for his hypertension is unclear. Could be primary but there could also be secondary issues. He appears to have chronic kidney disease. Renal ultrasound does show unequal kidneys. However, the difference is less than 1.5 cm. Patient will likely need outpatient workup to look for secondary causes of hypertension.   Acute on chronic kidney disease, possibly stage III According to records, the patient showed me, it appears that he has been diagnosed with chronic kidney disease. Baseline renal function is not known, however. Renal ultrasound report reviewed. No evidence for hydronephrosis. Patient  was given intravenous Lasix for suspected fluid overload. With this patient's  creatinine started climbing. Lasix has been discontinued. He continues to make urine. Not measured accurately. Creatinine has finally stabilized. Cozaar has been held.  Acute on chronic Diastolic CHF Echocardiogram does show diastolic dysfunction. Patient was given Lasix with good diuresis. However, his creatinine continues to climb. Lasix was discontinued on 10/18.   Chest pain. Most likely secondary to elevated blood pressure. Seen by cardiology. No need for inpatient workup. May consider outpatient stress test which patient can discuss with his outpatient providers. Continue aspirin.  Diabetes mellitus type 2. CBGs are reasonably well controlled. Continue home medication regimen.  Abnormal thyroid function tests. TSH is noted to be elevated. Free T4, however, also noted to be slightly related. Would recommend repeating these tests in a few weeks in the outpatient setting.  Tooth pain Patient apparently seen by an oral surgeon in the outpatient setting. Plan is for removal of teeth when his blood pressure is well controlled. Have explained to the patient that oral surgery does not come in to the hospital. This will have to be addressed in the outpatient setting. Continue clindamycin.  Overall improved. Patient keen on going home today. Okay for discharge.   PERTINENT LABS:  The results of significant diagnostics from this hospitalization (including imaging, microbiology, ancillary and laboratory) are listed below for reference.    Microbiology: Recent Results (from the past 240 hour(s))  MRSA PCR Screening     Status: None   Collection Time: 03/29/16  6:24 AM  Result Value Ref Range Status   MRSA by PCR NEGATIVE NEGATIVE Final    Comment:        The GeneXpert MRSA Assay (FDA approved for NASAL specimens only), is one component of a comprehensive MRSA colonization surveillance program. It is not intended to diagnose MRSA infection nor to guide or monitor treatment  for MRSA infections.      Labs: Basic Metabolic Panel:  Recent Labs Lab 03/29/16 0704 03/30/16 0804 03/31/16 0329 04/01/16 0329 04/02/16 0548  NA 140 136 136 136 137  K 4.0 4.1 3.4* 4.1 4.0  CL 106 102 103 100* 104  CO2 26 24 25 27 25   GLUCOSE 147* 166* 168* 101* 149*  BUN 30* 33* 37* 42* 44*  CREATININE 1.78* 2.07* 2.24* 2.46* 2.37*  CALCIUM 9.3 9.1 8.6* 8.8* 9.0   Liver Function Tests:  Recent Labs Lab 03/29/16 0704  AST 36  ALT 50  ALKPHOS 83  BILITOT 1.2  PROT 7.7  ALBUMIN 3.8   CBC:  Recent Labs Lab 03/28/16 2050 03/31/16 0329 04/01/16 0329 04/02/16 0548  WBC 11.5* 11.8* 12.6* 9.8  HGB 13.0 11.3* 11.4* 11.4*  HCT 38.8* 33.7* 33.5* 34.1*  MCV 93.3 93.4 92.8 92.7  PLT 219 215 212 218   Cardiac Enzymes:  Recent Labs Lab 03/29/16 0704 03/29/16 1206 03/29/16 1805  TROPONINI 0.03* <0.03 0.03*   BNP: BNP (last 3 results)  Recent Labs  03/28/16 2112 03/31/16 0329  BNP 658.7* 201.3*    CBG:  Recent Labs Lab 04/01/16 1153 04/01/16 1724 04/01/16 2033 04/02/16 0734 04/02/16 1139  GLUCAP 111* 139* 181* 142* 192*     IMAGING STUDIES Dg Chest 2 View  Result Date: 03/28/2016 CLINICAL DATA:  Left-sided chest pain with shortness of Breath EXAM: CHEST  2 VIEW COMPARISON:  04/08/2011 FINDINGS: Cardiac shadow is mildly enlarged. The lungs are well aerated bilaterally. Mild fullness of the central vasculature is noted without pulmonary edema. No other  focal abnormality is seen. IMPRESSION: Mild vascular congestion without pulmonary edema Electronically Signed   By: Inez Catalina M.D.   On: 03/28/2016 21:03   US Renal  Result Date: 03/30/2016 CLINICAL DATA:  Acute renal failure. EXAM: RENAL / URINARY TRACT ULTRASOUND COMPLETE COMPARISON:  No recent prior. FINDINGS: Right Kidney: Length: 11.1 cm. Echogenicity within normal limits. No mass or hydronephrosis visualized. Left Kidney: Length: 12.3 cm. Echogenicity within normal limits. No mass or  hydronephrosis visualized. Bladder: Appears normal for degree of bladder distention. IMPRESSION: No acute or focal abnormality identified. No evidence of hydronephrosis. No bladder distention. Electronically Signed   By: [place]   On: 03/30/2016 16:00    DISCHARGE EXAMINATION: Vitals:   04/01/16 2037 04/02/16 0544 04/02/16 0831 04/02/16 1043  BP: 117/69 (!) 152/78  131/72  Pulse: (!) 52 (!) 56 (!) 52 (!) 50  Resp: 20 20    Temp: 98.5 F (36.9 C) 98.1 F (36.7 C)    TempSrc: Oral Oral    SpO2: 99% 99%    Weight:  74.3 kg (163 lb 11.2 oz)    Height:       General appearance: alert, cooperative, appears stated age and no distress Resp: clear to auscultation bilaterally Cardio: S1, S2, is bradycardic, regular. No S3, S4. GI: soft, non-tender; bowel sounds normal; no masses,  no organomegaly Extremities: extremities normal, atraumatic, no cyanosis or edema Neurologic: Alert and oriented X 3, normal strength and tone. Normal symmetric reflexes. Normal coordination and gait  DISPOSITION: Home  Discharge Instructions    Call MD for:  difficulty breathing, headache or visual disturbances    Complete by:  As directed    Call MD for:  extreme fatigue    Complete by:  As directed    Call MD for:  hives    Complete by:  As directed    Call MD for:  persistant dizziness or light-headedness    Complete by:  As directed    Call MD for:  persistant nausea and vomiting    Complete by:  As directed    Call MD for:  severe uncontrolled pain    Complete by:  As directed    Call MD for:  temperature >100.4    Complete by:  As directed    Diet Carb Modified    Complete by:  As directed    Discharge instructions    Complete by:  As directed    Please be sure to follow up with you PCP within a week for the following: 1. Blood work to check your kidneys and to see if Lasix can be resumed 2. Check your thyroid function in 3-4 weeks 3. Refer you to a cardiologist (to consider stress  test) and Nephrologist (for Chronic kidney disease)  DO NOT TAKE [place] PCP.  Please be sure to take your medications as prescribed.  Please follow up with your oral surgeon for the teeth issues     You were cared for by a hospitalist during your hospital stay. If you have any questions about your discharge medications or the care you received while you were in the hospital after you are discharged, you can call the unit and asked to speak with the hospitalist on call if the hospitalist that took care of you is not available. Once you are discharged, your primary care physician will handle any further medical issues. Please note that NO REFILLS for any discharge medications will be authorized once you  are discharged, as it is imperative that you return to your primary care physician (or establish a relationship with a primary care physician if you do not have one) for your aftercare needs so that they can reassess your need for medications and monitor your lab values. If you do not have a primary care physician, you can call [phone] for a physician referral.   Increase activity slowly    Complete by:  As directed       ALLERGIES:  Allergies  Allergen Reactions  . Amlodipine Besy-Benazepril Hcl Shortness Of Breath and Swelling    Mouth and tongue swelling     Current Discharge Medication List    START taking these medications   Details  aspirin EC 81 MG EC tablet Take 1 tablet (81 mg total) by mouth daily. Qty: 30 tablet, Refills: 1    carvedilol (COREG) 25 MG tablet Take 1 tablet (25 mg total) by mouth 2 (two) times daily with a meal. DO NOT TAKE TILL SEEN BY YOUR PCP. Qty: 60 tablet, Refills: 1    clindamycin (CLEOCIN) 300 MG capsule Take 1 capsule (300 mg total) by mouth every 8 (eight) hours. For 7 days Qty: 21 capsule, Refills: 0    cloNIDine (CATAPRES) 0.3 MG tablet Take 1 tablet (0.3 mg total) by mouth 3 (three) times daily. Qty: 90 tablet, Refills: 1      oxyCODONE (OXY IR/ROXICODONE) 5 MG immediate release tablet Take 1-2 tablets (5-10 mg total) by mouth every 4 (four) hours as needed for moderate pain. Qty: 15 tablet, Refills: 0    polyethylene glycol (MIRALAX / GLYCOLAX) packet Take 17 g by mouth daily. Qty: 14 each, Refills: 0    saccharomyces boulardii (FLORASTOR) 250 MG capsule Take 1 capsule (250 mg total) by mouth 2 (two) times daily. Qty: 30 capsule, Refills: 0    senna (SENOKOT) 8.6 MG TABS tablet Take 1 tablet (8.6 mg total) by mouth daily. Qty: 120 each, Refills: 0      CONTINUE these medications which have CHANGED   Details  amLODipine (NORVASC) 10 MG tablet Take 1 tablet (10 mg total) by mouth daily. Qty: 30 tablet, Refills: 1    hydrALAZINE (APRESOLINE) 100 MG tablet Take 1 tablet (100 mg total) by mouth 3 (three) times daily. Qty: 90 tablet, Refills: 1      CONTINUE these medications which have NOT CHANGED   Details  glipiZIDE (GLUCOTROL) 10 MG tablet Take 10 mg by mouth daily before breakfast.    glucose 4 GM chewable tablet Chew 1 tablet by mouth daily as needed for low blood sugar.    insulin glargine (LANTUS) 100 UNIT/ML injection Inject 12-16 Units into the skin at bedtime. Sliding scale.    Skin Protectants, Misc. (EUCERIN) cream Apply 1 application topically daily. On feet      STOP taking these medications     atenolol (TENORMIN) 50 MG tablet      furosemide (LASIX) 20 MG tablet      ibuprofen (ADVIL,MOTRIN) 600 MG tablet      losartan (COZAAR) 100 MG tablet          TOTAL DISCHARGE TIME: 35 minutes  [facility]  [facility] Pager [phone]  04/02/2016, 1:16 PM

## 2016-04-02 NOTE — Discharge Instructions (Signed)
Managing Your High Blood Pressure Blood pressure is a measurement of how forceful your blood is pressing against the walls of the arteries. Arteries are muscular tubes within the circulatory system. Blood pressure does not stay the same. Blood pressure rises when you are active, excited, or nervous; and it lowers during sleep and relaxation. If the numbers measuring your blood pressure stay above normal most of the time, you are at risk for health problems. High blood pressure (hypertension) is a long-term (chronic) condition in which blood pressure is elevated. A blood pressure reading is recorded as two numbers, such as 120 over 80 (or 120/80). The first, higher number is called the systolic pressure. It is a measure of the pressure in your arteries as the heart beats. The second, lower number is called the diastolic pressure. It is a measure of the pressure in your arteries as the heart relaxes between beats.  Keeping your blood pressure in a normal range is important to your overall health and prevention of health problems, such as heart disease and stroke. When your blood pressure is uncontrolled, your heart has to work harder than normal. High blood pressure is a very common condition in adults because blood pressure tends to rise with age. Men and women are equally likely to have hypertension but at different times in life. Before age 75, men are more likely to have hypertension. After 62 years of age, women are more likely to have it. Hypertension is especially common in African Americans. This condition often has no signs or symptoms. The cause of the condition is usually not known. Your caregiver can help you come up with a plan to keep your blood pressure in a normal, healthy range. BLOOD PRESSURE STAGES Blood pressure is classified into four stages: normal, prehypertension, stage 1, and stage 2. Your blood pressure reading will be used to determine what type of treatment, if any, is necessary.  Appropriate treatment options are tied to these four stages:  Normal  Systolic pressure (mm Hg): below 120.  Diastolic pressure (mm Hg): below 80. Prehypertension  Systolic pressure (mm Hg): 120 to 139.  Diastolic pressure (mm Hg): 80 to 89. Stage1  Systolic pressure (mm Hg): 140 to 159.  Diastolic pressure (mm Hg): 90 to 99. Stage2  Systolic pressure (mm Hg): 160 or above.  Diastolic pressure (mm Hg): 100 or above. RISKS RELATED TO HIGH BLOOD PRESSURE Managing your blood pressure is an important responsibility. Uncontrolled high blood pressure can lead to:  A heart attack.  A stroke.  A weakened blood vessel (aneurysm).  Heart failure.  Kidney damage.  Eye damage.  Metabolic syndrome.  Memory and concentration problems. HOW TO MANAGE YOUR BLOOD PRESSURE Blood pressure can be managed effectively with lifestyle changes and medicines (if needed). Your caregiver will help you come up with a plan to bring your blood pressure within a normal range. Your plan should include the following: Education  Read all information provided by your caregivers about how to control blood pressure.  Educate yourself on the latest guidelines and treatment recommendations. New research is always being done to further define the risks and treatments for high blood pressure. Lifestylechanges  Control your weight.  Avoid smoking.  Stay physically active.  Reduce the amount of salt in your diet.  Reduce stress.  Control any chronic conditions, such as high cholesterol or diabetes.  Reduce your alcohol intake. Medicines  Several medicines (antihypertensive medicines) are available, if needed, to bring blood pressure within a normal range.  Communication  Review all the medicines you take with your caregiver because there may be side effects or interactions.  Talk with your caregiver about your diet, exercise habits, and other lifestyle factors that may be contributing to  high blood pressure.  See your caregiver regularly. Your caregiver can help you create and adjust your plan for managing high blood pressure. RECOMMENDATIONS FOR TREATMENT AND FOLLOW-UP  The following recommendations are based on current guidelines for managing high blood pressure in nonpregnant adults. Use these recommendations to identify the proper follow-up period or treatment option based on your blood pressure reading. You can discuss these options with your caregiver.  Systolic pressure of 338 to 250 or diastolic pressure of 80 to 89: Follow up with your caregiver as directed.  Systolic pressure of 539 to 767 or diastolic pressure of 90 to 100: Follow up with your caregiver within 2 months.  Systolic pressure above 341 or diastolic pressure above 937: Follow up with your caregiver within 1 month.  Systolic pressure above 902 or diastolic pressure above 409: Consider antihypertensive therapy; follow up with your caregiver within 1 week.  Systolic pressure above 735 or diastolic pressure above 329: Begin antihypertensive therapy; follow up with your caregiver within 1 week.   This information is not intended to replace advice given to you by your health care provider. Make sure you discuss any questions you have with your health care provider.   Document Released: 02/22/2012 Document Reviewed: 02/22/2012 Elsevier Interactive Patient Education [employer].

## 2016-05-11 NOTE — ED Provider Notes (Signed)
[facility] DEPT Provider Note   CSN: [id number] Arrival date & time: 03/28/16  1937     History   Chief Complaint Chief Complaint  Patient presents with  . Chest Pain    HPI Hakop A Hyson is a 60 y.o. male.  HPI   Pt is a 83 y/omale with pmhx of HTN, DM managed at the [place], presents to the ER with 3 days of central and left-sided CP with associated exertional SOB and LE edema which is new.  CP is described as a tightness w/o radiation, is intermittent, onset with exertion, lasts roughly 1 hour at a time, relieved sometimes with rest.  He is a current smoker.  He denies cough, fever, chills, sweats, wheeze.  He denies near syncope, N, V, abdominal pain, GERD.  He has paperwork from the [place] with documentation of all BP medications which he states he is compliant with, and paper work notes hx of RBBB.  No noted documentation of CHF, recent ECHO or other cardiac testing.  No other acute or associated sx.    Past Medical History:  Diagnosis Date  . Asthma   . DM (diabetes mellitus) ([place])   . HTN (hypertension)   . RBBB (right bundle branch block)     Patient Active Problem List   Diagnosis Date Noted  . Demand ischemia ([place])   . ARF (acute renal failure) ([place])   . Hypertensive urgency 03/29/2016  . Abnormal electrocardiogram 05/24/2011  . Chest pain 05/24/2011  . Hypertension 05/24/2011  . Tobacco abuse 05/24/2011  . DM (diabetes mellitus), type 2 with renal complications ([place]) 64/33/2951    Past Surgical History:  Procedure Laterality Date  . BACK SURGERY    . HAND SURGERY         Home Medications    Prior to Admission medications   Medication Sig Start Date End Date Taking? Authorizing Provider  glipiZIDE (GLUCOTROL) 10 MG tablet Take 10 mg by mouth daily before breakfast.   Yes Historical Provider, MD  glucose 4 GM chewable tablet Chew 1 tablet by mouth daily as needed for low blood sugar.   Yes Historical Provider, MD  insulin glargine (LANTUS) 100 UNIT/ML  injection Inject 12-16 Units into the skin at bedtime. Sliding scale.   Yes Historical Provider, MD  Skin Protectants, Misc. (EUCERIN) cream Apply 1 application topically daily. On feet   Yes Historical Provider, MD  amLODipine (NORVASC) 10 MG tablet Take 1 tablet (10 mg total) by mouth daily. 04/02/16 04/02/17  Bonnielee Haff, MD  aspirin EC 81 MG EC tablet Take 1 tablet (81 mg total) by mouth daily. 04/03/16   Bonnielee Haff, MD  carvedilol (COREG) 25 MG tablet Take 1 tablet (25 mg total) by mouth 2 (two) times daily with a meal. DO NOT TAKE TILL SEEN BY YOUR PCP. 04/02/16   Bonnielee Haff, MD  clindamycin (CLEOCIN) 300 MG capsule Take 1 capsule (300 mg total) by mouth every 8 (eight) hours. For 7 days 04/02/16   Bonnielee Haff, MD  cloNIDine (CATAPRES) 0.3 MG tablet Take 1 tablet (0.3 mg total) by mouth 3 (three) times daily. 04/02/16   Bonnielee Haff, MD  hydrALAZINE (APRESOLINE) 100 MG tablet Take 1 tablet (100 mg total) by mouth 3 (three) times daily. 04/02/16   Bonnielee Haff, MD  oxyCODONE (OXY IR/ROXICODONE) 5 MG immediate release tablet Take 1-2 tablets (5-10 mg total) by mouth every 4 (four) hours as needed for moderate pain. 04/02/16   Bonnielee Haff, MD  polyethylene glycol [facility] /  GLYCOLAX) packet Take 17 g by mouth daily. 04/03/16   Bonnielee Haff, MD  saccharomyces boulardii (FLORASTOR) 250 MG capsule Take 1 capsule (250 mg total) by mouth 2 (two) times daily. 04/02/16   Bonnielee Haff, MD  senna (SENOKOT) 8.6 MG TABS tablet Take 1 tablet (8.6 mg total) by mouth daily. 04/03/16   Bonnielee Haff, MD    Family History Family History  Problem Relation Age of Onset  . Coronary artery disease Mother     MI in her 50s  . Hypertension    . Diabetes    . Alzheimer's disease      Social History Social History  Substance Use Topics  . Smoking status: Current Every Day Smoker  . Smokeless tobacco: Never Used  . Alcohol use Yes     Comment: Few beers every other day      Allergies   Amlodipine besy-benazepril hcl   Review of Systems Review of Systems  All other systems reviewed and are negative.    Physical Exam Updated Vital Signs BP 135/73 (BP Location: Left Arm)   Pulse (!) 47   Temp 97.6 F (36.4 C) (Oral)   Resp 14   Ht 5\' 7"  (1.702 m)   Wt 74.3 kg   SpO2 100%   BMI 25.64 kg/m   Physical Exam  Constitutional: He is oriented to person, place, and time. He appears well-developed and well-nourished. No distress.  HENT:  Head: Normocephalic and atraumatic.  Nose: Nose normal.  Mouth/Throat: Oropharynx is clear and moist. No oropharyngeal exudate.  Eyes: Conjunctivae and EOM are normal. Pupils are equal, round, and reactive to light. Right eye exhibits no discharge. Left eye exhibits no discharge. No scleral icterus.  Neck: Normal range of motion. Neck supple. No JVD present. No tracheal deviation present.  Cardiovascular: Normal rate, regular rhythm, normal heart sounds and intact distal pulses.  Exam reveals no gallop and no friction rub.   No murmur heard. Bilateral pretibial pitting edema 1-2+  Pulmonary/Chest: Effort normal and breath sounds normal. No stridor. No respiratory distress. He has no wheezes. He has no rales. He exhibits no tenderness.  Abdominal: Soft. Bowel sounds are normal. He exhibits no distension and no mass. There is no tenderness. There is no rebound and no guarding.  Musculoskeletal: Normal range of motion. He exhibits edema. He exhibits no tenderness.  Neurological: He is alert and oriented to person, place, and time. He exhibits normal muscle tone. Coordination normal.  Skin: Skin is warm and dry. Capillary refill takes less than 2 seconds. No rash noted. He is not diaphoretic. No erythema. No pallor.  Psychiatric: He has a normal mood and affect. His behavior is normal. Judgment and thought content normal.  Nursing note and vitals reviewed.    ED Treatments / Results  Labs (all labs ordered are  listed, but only abnormal results are displayed) Labs Reviewed  BASIC METABOLIC PANEL - Abnormal; Notable for the following:       Result Value   Glucose, Bld 122 (*)    BUN 31 (*)    Creatinine, Ser 1.90 (*)    GFR calc non Af Amer 36 (*)    GFR calc Af Amer 42 (*)    All other components within normal limits  CBC - Abnormal; Notable for the following:    WBC 11.5 (*)    RBC 4.16 (*)    HCT 38.8 (*)    All other components within normal limits  BRAIN NATRIURETIC PEPTIDE - Abnormal;  Notable for the following:    B Natriuretic Peptide 658.7 (*)    All other components within normal limits  TROPONIN I - Abnormal; Notable for the following:    Troponin I 0.03 (*)    All other components within normal limits  TROPONIN I - Abnormal; Notable for the following:    Troponin I 0.03 (*)    All other components within normal limits  RAPID URINE DRUG SCREEN, HOSP PERFORMED - Abnormal; Notable for the following:    Opiates POSITIVE (*)    All other components within normal limits  COMPREHENSIVE METABOLIC PANEL - Abnormal; Notable for the following:    Glucose, Bld 147 (*)    BUN 30 (*)    Creatinine, Ser 1.78 (*)    GFR calc non Af Amer 39 (*)    GFR calc Af Amer 46 (*)    All other components within normal limits  TSH - Abnormal; Notable for the following:    TSH 9.461 (*)    All other components within normal limits  URINALYSIS, ROUTINE W REFLEX MICROSCOPIC (NOT AT [facility]) - Abnormal; Notable for the following:    Hgb urine dipstick TRACE (*)    Protein, ur 100 (*)    All other components within normal limits  GLUCOSE, CAPILLARY - Abnormal; Notable for the following:    Glucose-Capillary 130 (*)    All other components within normal limits  GLUCOSE, CAPILLARY - Abnormal; Notable for the following:    Glucose-Capillary 131 (*)    All other components within normal limits  URINE MICROSCOPIC-ADD ON - Abnormal; Notable for the following:    Squamous Epithelial / LPF 0-5 (*)     Bacteria, UA RARE (*)    All other components within normal limits  GLUCOSE, CAPILLARY - Abnormal; Notable for the following:    Glucose-Capillary 198 (*)    All other components within normal limits  GLUCOSE, CAPILLARY - Abnormal; Notable for the following:    Glucose-Capillary 222 (*)    All other components within normal limits  GLUCOSE, CAPILLARY - Abnormal; Notable for the following:    Glucose-Capillary 193 (*)    All other components within normal limits  GLUCOSE, CAPILLARY - Abnormal; Notable for the following:    Glucose-Capillary 204 (*)    All other components within normal limits  BASIC METABOLIC PANEL - Abnormal; Notable for the following:    Glucose, Bld 166 (*)    BUN 33 (*)    Creatinine, Ser 2.07 (*)    GFR calc non Af Amer 33 (*)    GFR calc Af Amer 38 (*)    All other components within normal limits  T4, FREE - Abnormal; Notable for the following:    Free T4 1.26 (*)    All other components within normal limits  GLUCOSE, CAPILLARY - Abnormal; Notable for the following:    Glucose-Capillary 144 (*)    All other components within normal limits  GLUCOSE, CAPILLARY - Abnormal; Notable for the following:    Glucose-Capillary 135 (*)    All other components within normal limits  CBC - Abnormal; Notable for the following:    WBC 11.8 (*)    RBC 3.61 (*)    Hemoglobin 11.3 (*)    HCT 33.7 (*)    All other components within normal limits  BASIC METABOLIC PANEL - Abnormal; Notable for the following:    Potassium 3.4 (*)    Glucose, Bld 168 (*)    BUN 37 (*)  Creatinine, Ser 2.24 (*)    Calcium 8.6 (*)    GFR calc non Af Amer 30 (*)    GFR calc Af Amer 35 (*)    All other components within normal limits  HEMOGLOBIN A1C - Abnormal; Notable for the following:    Hgb A1c MFr Bld 7.2 (*)    All other components within normal limits  BRAIN NATRIURETIC PEPTIDE - Abnormal; Notable for the following:    B Natriuretic Peptide 201.3 (*)    All other components within  normal limits  GLUCOSE, CAPILLARY - Abnormal; Notable for the following:    Glucose-Capillary 176 (*)    All other components within normal limits  GLUCOSE, CAPILLARY - Abnormal; Notable for the following:    Glucose-Capillary 128 (*)    All other components within normal limits  GLUCOSE, CAPILLARY - Abnormal; Notable for the following:    Glucose-Capillary 117 (*)    All other components within normal limits  GLUCOSE, CAPILLARY - Abnormal; Notable for the following:    Glucose-Capillary 151 (*)    All other components within normal limits  GLUCOSE, CAPILLARY - Abnormal; Notable for the following:    Glucose-Capillary 198 (*)    All other components within normal limits  CBC - Abnormal; Notable for the following:    WBC 12.6 (*)    RBC 3.61 (*)    Hemoglobin 11.4 (*)    HCT 33.5 (*)    All other components within normal limits  BASIC METABOLIC PANEL - Abnormal; Notable for the following:    Chloride 100 (*)    Glucose, Bld 101 (*)    BUN 42 (*)    Creatinine, Ser 2.46 (*)    Calcium 8.8 (*)    GFR calc non Af Amer 27 (*)    GFR calc Af Amer 31 (*)    All other components within normal limits  GLUCOSE, CAPILLARY - Abnormal; Notable for the following:    Glucose-Capillary 113 (*)    All other components within normal limits  GLUCOSE, CAPILLARY - Abnormal; Notable for the following:    Glucose-Capillary 111 (*)    All other components within normal limits  CBC - Abnormal; Notable for the following:    RBC 3.68 (*)    Hemoglobin 11.4 (*)    HCT 34.1 (*)    All other components within normal limits  BASIC METABOLIC PANEL - Abnormal; Notable for the following:    Glucose, Bld 149 (*)    BUN 44 (*)    Creatinine, Ser 2.37 (*)    GFR calc non Af Amer 28 (*)    GFR calc Af Amer 32 (*)    All other components within normal limits  GLUCOSE, CAPILLARY - Abnormal; Notable for the following:    Glucose-Capillary 139 (*)    All other components within normal limits  GLUCOSE,  CAPILLARY - Abnormal; Notable for the following:    Glucose-Capillary 181 (*)    All other components within normal limits  GLUCOSE, CAPILLARY - Abnormal; Notable for the following:    Glucose-Capillary 142 (*)    All other components within normal limits  GLUCOSE, CAPILLARY - Abnormal; Notable for the following:    Glucose-Capillary 192 (*)    All other components within normal limits  CBG MONITORING, ED - Abnormal; Notable for the following:    Glucose-Capillary 144 (*)    All other components within normal limits  CBG MONITORING, ED - Abnormal; Notable for the following:  Glucose-Capillary 143 (*)    All other components within normal limits  CBG MONITORING, ED - Abnormal; Notable for the following:    Glucose-Capillary 151 (*)    All other components within normal limits  MRSA PCR SCREENING  TROPONIN I  CREATININE, URINE, RANDOM  SODIUM, URINE, RANDOM  GLUCOSE, CAPILLARY  I-STAT TROPOININ, ED  CBG MONITORING, ED    EKG  EKG Interpretation  Date/Time:  Monday March 28 2016 20:00:34 EDT Ventricular Rate:  69 PR Interval:    QRS Duration: 157 QT Interval:  481 QTC Calculation: 516 R Axis:   84 Text Interpretation:  Sinus rhythm Left atrial enlargement Right bundle branch block Confirmed by Hazle Coca [phone]) on 03/29/2016 8:19:00 PM       Radiology DG Chest 2 View (Final result)  Result time 03/28/16 21:03:49  Final result by Inez Catalina, MD (03/28/16 21:03:49)           Narrative:   CLINICAL DATA: Left-sided chest pain with shortness of Breath  EXAM: CHEST 2 VIEW  COMPARISON: 04/08/2011  FINDINGS: Cardiac shadow is mildly enlarged. The lungs are well aerated bilaterally. Mild fullness of the central vasculature is noted without pulmonary edema. No other focal abnormality is seen.  IMPRESSION: Mild vascular congestion without pulmonary edema   Electronically Signed By: Inez Catalina M.D. On: 03/28/2016 21:03             Procedures Procedures (including critical care time)   Medications Ordered in ED Medications  nitroGLYCERIN (NITROSTAT) SL tablet 0.4 mg (0.4 mg Sublingual Given 03/28/16 2343)  aspirin chewable tablet 324 mg (324 mg Oral Given 03/28/16 2238)  morphine 4 MG/ML injection 4 mg (4 mg Intravenous Given 03/28/16 2239)  furosemide (LASIX) injection 40 mg (40 mg Intravenous Given 03/29/16 0055)  nitroGLYCERIN 50 mg in dextrose 5 % 250 mL (0.2 mg/mL) infusion (0 mcg/min Intravenous Transfusing/Transfer 03/29/16 0602)  morphine 4 MG/ML injection 4 mg (4 mg Intravenous Given 03/29/16 0229)  furosemide (LASIX) injection 40 mg (40 mg Intravenous Given 03/29/16 1305)  cloNIDine (CATAPRES) tablet 0.1 mg (0.1 mg Oral Given 03/31/16 1230)     Initial Impression / Assessment and Plan / ED Course  I have reviewed the triage vital signs and the nursing notes.  Pertinent labs & imaging results that were available during my care of the patient were reviewed by me and considered in my medical decision making (see chart for details).  Clinical Course    Pt with 3 days of CP, exertional SOB and new LE edema, hx of HTN on multiple medications, and DM.  He is a current smoker.  He reports compliance to medications, has [facility] records with him.  At presentation BP markedly elevated 230-240's/100-130's.  Initially given SL NTG with temporary improvement in both CP and BP, cardiac work up initiated with BNP, EKG, basic labs. He was also given 325 ASA, morphine, IV lasix  BNP elevated, no hx of CHF, Bun and sCr elevated, concern for hypertensive urgency with AKI and likely cause of CP.  In the ER the pt has LE edema, but no pulmonary edema on Xray or concerning lung exam findings.    Pt was placed on nitro drip and admitted for further work up and treatment.  Initial troponin in the ED was negative, EKG showed RBBB which [facility].  Dr. Hal Hope to admit.    Final Clinical  Impressions(s) / ED Diagnoses   Final diagnoses:  Hypertensive urgency  Chest pain, unspecified  type    New Prescriptions Discharge Medication List as of 04/02/2016 12:10 PM    START taking these medications   Details  aspirin EC 81 MG EC tablet Take 1 tablet (81 mg total) by mouth daily., Starting Sun 04/03/2016, Print    clindamycin (CLEOCIN) 300 MG capsule Take 1 capsule (300 mg total) by mouth every 8 (eight) hours. For 7 days, Starting Sat 04/02/2016, Print    cloNIDine (CATAPRES) 0.3 MG tablet Take 1 tablet (0.3 mg total) by mouth 3 (three) times daily., Starting Sat 04/02/2016, Print    oxyCODONE (OXY IR/ROXICODONE) 5 MG immediate release tablet Take 1-2 tablets (5-10 mg total) by mouth every 4 (four) hours as needed for moderate pain., Starting Sat 04/02/2016, Print    polyethylene glycol (MIRALAX / GLYCOLAX) packet Take 17 g by mouth daily., Starting Sun 04/03/2016, Print    saccharomyces boulardii (FLORASTOR) 250 MG capsule Take 1 capsule (250 mg total) by mouth 2 (two) times daily., Starting Sat 04/02/2016, Print    senna (SENOKOT) 8.6 MG TABS tablet Take 1 tablet (8.6 mg total) by mouth daily., Starting Sun 04/03/2016, Print         Delsa Grana, PA-C 05/11/16 [place], MD 05/13/16 [phone]

## 2016-06-13 DIAGNOSIS — I639 Cerebral infarction, unspecified: Secondary | ICD-10-CM

## 2016-06-13 HISTORY — DX: Cerebral infarction, unspecified: I63.9

## 2016-11-02 ENCOUNTER — Encounter (HOSPITAL_COMMUNITY): Payer: Self-pay | Admitting: Emergency Medicine

## 2016-11-02 ENCOUNTER — Inpatient Hospital Stay (HOSPITAL_COMMUNITY)
Admission: EM | Admit: 2016-11-02 | Discharge: 2016-11-04 | DRG: 065 | Disposition: A | Discharge status: Rehabilitation Facility | Payer: Medicaid Other | Attending: Internal Medicine | Admitting: Internal Medicine

## 2016-11-02 ENCOUNTER — Emergency Department (HOSPITAL_COMMUNITY): Payer: Medicaid Other

## 2016-11-02 DIAGNOSIS — N183 Chronic kidney disease, stage 3 unspecified: Secondary | ICD-10-CM | POA: Diagnosis present

## 2016-11-02 DIAGNOSIS — S91331A Puncture wound without foreign body, right foot, initial encounter: Secondary | ICD-10-CM | POA: Diagnosis not present

## 2016-11-02 DIAGNOSIS — F17210 Nicotine dependence, cigarettes, uncomplicated: Secondary | ICD-10-CM | POA: Diagnosis present

## 2016-11-02 DIAGNOSIS — I69398 Other sequelae of cerebral infarction: Secondary | ICD-10-CM

## 2016-11-02 DIAGNOSIS — E1151 Type 2 diabetes mellitus with diabetic peripheral angiopathy without gangrene: Secondary | ICD-10-CM | POA: Diagnosis present

## 2016-11-02 DIAGNOSIS — G8929 Other chronic pain: Secondary | ICD-10-CM | POA: Diagnosis present

## 2016-11-02 DIAGNOSIS — N1832 Chronic kidney disease, stage 3b: Secondary | ICD-10-CM | POA: Diagnosis present

## 2016-11-02 DIAGNOSIS — E1129 Type 2 diabetes mellitus with other diabetic kidney complication: Secondary | ICD-10-CM | POA: Diagnosis present

## 2016-11-02 DIAGNOSIS — S91331D Puncture wound without foreign body, right foot, subsequent encounter: Secondary | ICD-10-CM

## 2016-11-02 DIAGNOSIS — N184 Chronic kidney disease, stage 4 (severe): Secondary | ICD-10-CM | POA: Diagnosis present

## 2016-11-02 DIAGNOSIS — I5033 Acute on chronic diastolic (congestive) heart failure: Secondary | ICD-10-CM | POA: Diagnosis present

## 2016-11-02 DIAGNOSIS — Z7982 Long term (current) use of aspirin: Secondary | ICD-10-CM

## 2016-11-02 DIAGNOSIS — I5032 Chronic diastolic (congestive) heart failure: Secondary | ICD-10-CM

## 2016-11-02 DIAGNOSIS — D649 Anemia, unspecified: Secondary | ICD-10-CM | POA: Diagnosis not present

## 2016-11-02 DIAGNOSIS — Z79899 Other long term (current) drug therapy: Secondary | ICD-10-CM

## 2016-11-02 DIAGNOSIS — I10 Essential (primary) hypertension: Secondary | ICD-10-CM | POA: Diagnosis not present

## 2016-11-02 DIAGNOSIS — M545 Low back pain, unspecified: Secondary | ICD-10-CM | POA: Diagnosis present

## 2016-11-02 DIAGNOSIS — E1122 Type 2 diabetes mellitus with diabetic chronic kidney disease: Secondary | ICD-10-CM

## 2016-11-02 DIAGNOSIS — Z91013 Allergy to seafood: Secondary | ICD-10-CM

## 2016-11-02 DIAGNOSIS — E1142 Type 2 diabetes mellitus with diabetic polyneuropathy: Secondary | ICD-10-CM | POA: Diagnosis present

## 2016-11-02 DIAGNOSIS — J45909 Unspecified asthma, uncomplicated: Secondary | ICD-10-CM | POA: Diagnosis present

## 2016-11-02 DIAGNOSIS — E785 Hyperlipidemia, unspecified: Secondary | ICD-10-CM

## 2016-11-02 DIAGNOSIS — E876 Hypokalemia: Secondary | ICD-10-CM | POA: Diagnosis present

## 2016-11-02 DIAGNOSIS — W228XXD Striking against or struck by other objects, subsequent encounter: Secondary | ICD-10-CM

## 2016-11-02 DIAGNOSIS — I639 Cerebral infarction, unspecified: Principal | ICD-10-CM | POA: Diagnosis present

## 2016-11-02 DIAGNOSIS — I160 Hypertensive urgency: Secondary | ICD-10-CM | POA: Diagnosis not present

## 2016-11-02 DIAGNOSIS — M5442 Lumbago with sciatica, left side: Secondary | ICD-10-CM | POA: Diagnosis not present

## 2016-11-02 DIAGNOSIS — R269 Unspecified abnormalities of gait and mobility: Secondary | ICD-10-CM

## 2016-11-02 DIAGNOSIS — D638 Anemia in other chronic diseases classified elsewhere: Secondary | ICD-10-CM | POA: Diagnosis present

## 2016-11-02 DIAGNOSIS — Z794 Long term (current) use of insulin: Secondary | ICD-10-CM

## 2016-11-02 DIAGNOSIS — R270 Ataxia, unspecified: Secondary | ICD-10-CM

## 2016-11-02 DIAGNOSIS — I130 Hypertensive heart and chronic kidney disease with heart failure and stage 1 through stage 4 chronic kidney disease, or unspecified chronic kidney disease: Secondary | ICD-10-CM | POA: Diagnosis present

## 2016-11-02 DIAGNOSIS — N179 Acute kidney failure, unspecified: Secondary | ICD-10-CM | POA: Diagnosis present

## 2016-11-02 DIAGNOSIS — E1169 Type 2 diabetes mellitus with other specified complication: Secondary | ICD-10-CM

## 2016-11-02 DIAGNOSIS — D72829 Elevated white blood cell count, unspecified: Secondary | ICD-10-CM | POA: Diagnosis present

## 2016-11-02 LAB — URINALYSIS, ROUTINE W REFLEX MICROSCOPIC
Bilirubin Urine: NEGATIVE
Glucose, UA: >=500 mg/dL — AB
Ketones, ur: NEGATIVE mg/dL
Leukocytes, UA: NEGATIVE
Nitrite: NEGATIVE
Protein, ur: >=300 mg/dL — AB
Specific Gravity, Urine: 1.017 (ref 1.005–1.030)
pH: 5 (ref 5.0–8.0)

## 2016-11-02 LAB — CBC
HEMATOCRIT: 35.9 % — AB (ref 39.0–52.0)
HEMOGLOBIN: 11.8 g/dL — AB (ref 13.0–17.0)
MCH: 29 pg (ref 26.0–34.0)
MCHC: 32.9 g/dL (ref 30.0–36.0)
MCV: 88.2 fL (ref 78.0–100.0)
Platelets: 179 10*3/uL (ref 150–400)
RBC: 4.07 MIL/uL — ABNORMAL LOW (ref 4.22–5.81)
RDW: 13.6 % (ref 11.5–15.5)
WBC: 14.3 10*3/uL — AB (ref 4.0–10.5)

## 2016-11-02 LAB — COMPREHENSIVE METABOLIC PANEL WITH GFR
ALT: 30 U/L (ref 17–63)
AST: 29 U/L (ref 15–41)
Albumin: 3.3 g/dL — ABNORMAL LOW (ref 3.5–5.0)
Alkaline Phosphatase: 99 U/L (ref 38–126)
Anion gap: 9 (ref 5–15)
BUN: 18 mg/dL (ref 6–20)
CO2: 23 mmol/L (ref 22–32)
Calcium: 8.6 mg/dL — ABNORMAL LOW (ref 8.9–10.3)
Chloride: 102 mmol/L (ref 101–111)
Creatinine, Ser: 1.78 mg/dL — ABNORMAL HIGH (ref 0.61–1.24)
GFR calc Af Amer: 45 mL/min — ABNORMAL LOW
GFR calc non Af Amer: 39 mL/min — ABNORMAL LOW
Glucose, Bld: 197 mg/dL — ABNORMAL HIGH (ref 65–99)
Potassium: 3.6 mmol/L (ref 3.5–5.1)
Sodium: 134 mmol/L — ABNORMAL LOW (ref 135–145)
Total Bilirubin: 0.7 mg/dL (ref 0.3–1.2)
Total Protein: 6.9 g/dL (ref 6.5–8.1)

## 2016-11-02 LAB — I-STAT TROPONIN, ED: TROPONIN I, POC: 0.03 ng/mL (ref 0.00–0.08)

## 2016-11-02 LAB — SEDIMENTATION RATE: SED RATE: 64 mm/h — AB (ref 0–16)

## 2016-11-02 LAB — C-REACTIVE PROTEIN: CRP: 1.2 mg/dL — AB (ref <1.0)

## 2016-11-02 MED ORDER — INSULIN ASPART 100 UNIT/ML ~~LOC~~ SOLN
0.0000 [IU] | Freq: Every day | SUBCUTANEOUS | Status: DC
  Administered 2016-11-03: 2 [IU] via SUBCUTANEOUS
  Filled 2016-11-02: qty 1

## 2016-11-02 MED ORDER — HEPARIN SODIUM (PORCINE) 5000 UNIT/ML IJ SOLN
5000.0000 [IU] | Freq: Three times a day (TID) | INTRAMUSCULAR | Status: DC
  Administered 2016-11-03 – 2016-11-04 (×4): 5000 [IU] via SUBCUTANEOUS
  Filled 2016-11-02 (×5): qty 1

## 2016-11-02 MED ORDER — OXYCODONE HCL 5 MG PO TABS
5.0000 mg | ORAL_TABLET | ORAL | Status: DC | PRN
Start: 2016-11-02 — End: 2016-11-04
  Administered 2016-11-03: 10 mg via ORAL
  Administered 2016-11-03 – 2016-11-04 (×2): 5 mg via ORAL
  Filled 2016-11-02: qty 1
  Filled 2016-11-02: qty 2
  Filled 2016-11-02: qty 1

## 2016-11-02 MED ORDER — ACETAMINOPHEN 650 MG RE SUPP: 650.0000 mg | RECTAL | Status: DC | PRN

## 2016-11-02 MED ORDER — POLYETHYLENE GLYCOL 3350 17 G PO PACK
17.0000 g | PACK | Freq: Every day | ORAL | Status: DC
  Administered 2016-11-03 – 2016-11-04 (×2): 17 g via ORAL
  Filled 2016-11-02 (×2): qty 1

## 2016-11-02 MED ORDER — ACETAMINOPHEN 160 MG/5ML PO SOLN: 650.0000 mg | ORAL | Status: DC | PRN

## 2016-11-02 MED ORDER — TETANUS-DIPHTH-ACELL PERTUSSIS 5-2.5-18.5 LF-MCG/0.5 IM SUSP
0.5000 mL | Freq: Once | INTRAMUSCULAR | Status: AC
  Administered 2016-11-02: 0.5 mL via INTRAMUSCULAR
  Filled 2016-11-02: qty 0.5

## 2016-11-02 MED ORDER — LABETALOL HCL 5 MG/ML IV SOLN
10.0000 mg | Freq: Once | INTRAVENOUS | Status: AC
  Administered 2016-11-02: 10 mg via INTRAVENOUS
  Filled 2016-11-02: qty 4

## 2016-11-02 MED ORDER — SACCHAROMYCES BOULARDII 250 MG PO CAPS
250.0000 mg | ORAL_CAPSULE | Freq: Two times a day (BID) | ORAL | Status: DC
  Administered 2016-11-03 – 2016-11-04 (×3): 250 mg via ORAL
  Filled 2016-11-02 (×3): qty 1

## 2016-11-02 MED ORDER — INSULIN ASPART 100 UNIT/ML ~~LOC~~ SOLN
3.0000 [IU] | Freq: Three times a day (TID) | SUBCUTANEOUS | Status: DC
  Administered 2016-11-03 – 2016-11-04 (×4): 3 [IU] via SUBCUTANEOUS

## 2016-11-02 MED ORDER — LABETALOL HCL 5 MG/ML IV SOLN
20.0000 mg | Freq: Once | INTRAVENOUS | Status: AC
  Administered 2016-11-02: 20 mg via INTRAVENOUS
  Filled 2016-11-02: qty 4

## 2016-11-02 MED ORDER — SENNA 8.6 MG PO TABS
1.0000 | ORAL_TABLET | Freq: Every day | ORAL | Status: DC
  Administered 2016-11-03 – 2016-11-04 (×2): 8.6 mg via ORAL
  Filled 2016-11-02 (×2): qty 1

## 2016-11-02 MED ORDER — STROKE: EARLY STAGES OF RECOVERY BOOK
Freq: Once | Status: AC
  Administered 2016-11-02
  Filled 2016-11-02 (×2): qty 1

## 2016-11-02 MED ORDER — INSULIN ASPART 100 UNIT/ML ~~LOC~~ SOLN
0.0000 [IU] | Freq: Three times a day (TID) | SUBCUTANEOUS | Status: DC
  Administered 2016-11-03 (×2): 3 [IU] via SUBCUTANEOUS
  Administered 2016-11-04: 2 [IU] via SUBCUTANEOUS

## 2016-11-02 MED ORDER — ASPIRIN 325 MG PO TABS
325.0000 mg | ORAL_TABLET | Freq: Every day | ORAL | Status: DC
  Administered 2016-11-03: 325 mg via ORAL
  Filled 2016-11-02: qty 1

## 2016-11-02 MED ORDER — ACETAMINOPHEN 325 MG PO TABS: 650.0000 mg | ORAL_TABLET | ORAL | Status: DC | PRN

## 2016-11-02 MED ORDER — LABETALOL HCL 5 MG/ML IV SOLN
10.0000 mg | INTRAVENOUS | Status: DC | PRN
  Administered 2016-11-03: 10 mg via INTRAVENOUS
  Filled 2016-11-02: qty 4

## 2016-11-02 MED ORDER — INSULIN GLARGINE 100 UNIT/ML ~~LOC~~ SOLN
10.0000 [IU] | Freq: Every day | SUBCUTANEOUS | Status: DC
  Administered 2016-11-03 (×2): 10 [IU] via SUBCUTANEOUS
  Filled 2016-11-02 (×4): qty 0.1

## 2016-11-02 MED ORDER — HYDROCERIN EX CREA
1.0000 "application " | TOPICAL_CREAM | Freq: Every day | CUTANEOUS | Status: DC
  Administered 2016-11-03 – 2016-11-04 (×2): 1 via TOPICAL
  Filled 2016-11-02 (×2): qty 113

## 2016-11-02 MED ORDER — ASPIRIN 300 MG RE SUPP: 300.0000 mg | Freq: Every day | RECTAL | Status: DC

## 2016-11-02 NOTE — ED Provider Notes (Signed)
John Jacobson Provider Note   CSN: [id number] Arrival date & time: 11/02/16  1818     History   Chief Complaint Chief Complaint  Patient presents with  . Weakness    HPI John Jacobson is a 61 y.o. male.  HPI Patient with history of diabetes and peripheral vascular disease presents with weakness and difficulty walking. States this been going on for the past 5 days. Described weakness to bilateral lower extremities and dizziness.Marland Kitchen Describes dizziness as lightheadedness. Worse with position changes. States that prior to symptoms beginning he stepped on a nail with the right foot. States he lost significant amount of blood. Did not see a doctor at that time. States he would gradually improving but that his weakness has worsened in the last few days. Denies any fever or chills. States he is not taking his blood pressure medication today. Denies headache or visual changes. Believes the last tetanus was within the last 5 years. Normally receives his care from the [place]. Past Medical History:  Diagnosis Date  . Asthma   . DM (diabetes mellitus) ([place])   . HTN (hypertension)   . RBBB (right bundle branch block)     Patient Active Problem List   Diagnosis Date Noted  . CKD (chronic kidney disease), stage III 11/02/2016  . Normocytic anemia 11/02/2016  . Loss of coordination 11/02/2016  . Ischemic stroke ([place]) 11/02/2016  . Puncture wound of right foot 11/02/2016  . Chronic diastolic CHF (congestive heart failure) ([place]) 11/02/2016  . Chronic left-sided low back pain 11/02/2016  . Demand ischemia ([place])   . ARF (acute renal failure) ([place])   . Hypertensive urgency 03/29/2016  . Abnormal electrocardiogram 05/24/2011  . Chest pain 05/24/2011  . Hypertension 05/24/2011  . Tobacco abuse 05/24/2011  . DM (diabetes mellitus), type 2 with renal complications ([place]) 61/95/0932    Past Surgical History:  Procedure Laterality Date  . BACK SURGERY    . HAND SURGERY         Home  Medications    Prior to Admission medications   Medication Sig Start Date End Date Taking? Authorizing Provider  aspirin EC 81 MG EC tablet Take 1 tablet (81 mg total) by mouth daily. 04/03/16  Yes Bonnielee Haff, MD  Carboxymethylcellulose Sod PF 0.25 % SOLN Apply 1 drop to eye 4 (four) times daily.   Yes [provider]  cloNIDine (CATAPRES) 0.3 MG tablet Take 1 tablet (0.3 mg total) by mouth 3 (three) times daily. 04/02/16  Yes Bonnielee Haff, MD  doxazosin (CARDURA) 4 MG tablet Take 2 mg by mouth 2 (two) times daily.   Yes [provider]  Elbasvir-Grazoprevir 50-100 MG TABS Take 1 tablet by mouth daily.   Yes [provider]  furosemide (LASIX) 40 MG tablet Take 40 mg by mouth daily.   Yes [provider]  glipiZIDE (GLUCOTROL) 10 MG tablet Take 10 mg by mouth daily before breakfast.   Yes [provider]  glucose 4 GM chewable tablet Chew 1 tablet by mouth daily as needed for low blood sugar.   Yes [provider]  hydrALAZINE (APRESOLINE) 100 MG tablet Take 1 tablet (100 mg total) by mouth 3 (three) times daily. 04/02/16  Yes Bonnielee Haff, MD  insulin glargine (LANTUS) 100 UNIT/ML injection Inject 16-19 Units into the skin at bedtime. Sliding scale.    Yes [provider]  losartan (COZAAR) 50 MG tablet Take 50 mg by mouth daily.   Yes [provider]  oxyCODONE (  OXY IR/ROXICODONE) 5 MG immediate release tablet Take 1-2 tablets (5-10 mg total) by mouth every 4 (four) hours as needed for moderate pain. 04/02/16  Yes Bonnielee Haff, MD  polyethylene glycol [facility] / GLYCOLAX) packet Take 17 g by mouth daily. Patient taking differently: Take 17 g by mouth daily as needed for mild constipation.  04/03/16  Yes Bonnielee Haff, MD  senna (SENOKOT) 8.6 MG TABS tablet Take 1 tablet (8.6 mg total) by mouth daily. Patient taking differently: Take 1 tablet by mouth daily as needed for mild constipation.  04/03/16  Yes  Bonnielee Haff, MD  Skin Protectants, Misc. (EUCERIN) cream Apply 1 application topically daily. On feet   Yes [provider]    Family History Family History  Problem Relation Age of Onset  . Coronary artery disease Mother        MI in her 59s  . Hypertension Unknown   . Diabetes Unknown   . Alzheimer's disease Unknown     Social History Social History  Substance Use Topics  . Smoking status: Current Every Day Smoker  . Smokeless tobacco: Never Used  . Alcohol use Yes     Comment: Few beers every other day     Allergies   Amlodipine besy-benazepril hcl and Shellfish allergy   Review of Systems Review of Systems  Constitutional: Positive for fatigue. Negative for chills and fever.  Respiratory: Negative for cough and shortness of breath.   Cardiovascular: Negative for chest pain and leg swelling.  Gastrointestinal: Negative for abdominal pain, diarrhea, nausea and vomiting.  Genitourinary: Negative for dysuria, flank pain and frequency.  Musculoskeletal: Positive for back pain and myalgias. Negative for neck pain and neck stiffness.  Skin: Positive for wound. Negative for rash.  Neurological: Positive for dizziness, weakness and light-headedness. Negative for syncope, numbness and headaches.  All other systems reviewed and are negative.    Physical Exam Updated Vital Signs BP 134/61 (BP Location: Right Arm)   Pulse (!) 58   Temp 98.4 F (36.9 C) (Oral)   Resp 20   Ht 5\' 7"  (1.702 m)   Wt 74 kg (163 lb 3.2 oz)   SpO2 100%   BMI 25.56 kg/m   Physical Exam  Constitutional: He is oriented to person, place, and time. He appears well-developed and well-nourished. No distress.  HENT:  Head: Normocephalic and atraumatic.  Mouth/Throat: Oropharynx is clear and moist. No oropharyngeal exudate.  Eyes: EOM are normal. Pupils are equal, round, and reactive to light.  Neck: Normal range of motion. Neck supple.  No meningismus  Cardiovascular: Normal rate  and regular rhythm.  Exam reveals no gallop and no friction rub.   No murmur heard. Pulmonary/Chest: Effort normal and breath sounds normal. No respiratory distress. He has no wheezes. He has no rales. He exhibits no tenderness.  Abdominal: Soft. Bowel sounds are normal. There is no tenderness. There is no rebound and no guarding.  Musculoskeletal: Normal range of motion. He exhibits no edema or tenderness.  Patient has small puncture wound to the plantar surface of the right foot at the level of the second MTP. There is no obvious erythema or swelling. No apparent tenderness to palpation. No deformity. No active bleeding. Difficult to palpate pulses in bilateral feet. Both feet are warm to touch. No midline thoracic or lumbar tenderness. Patient does have some mild left lumbar paraspinal tenderness. Negative straight leg raise bilaterally.  Neurological: He is alert and oriented to person, place, and time.  5/5 motor in  all extremities. Patient has some mild decreased sensation over the dorsum of the left hand, otherwise sensation intact. Cranial nerves II through XII grossly intact.  Skin: Skin is warm and dry. Capillary refill takes less than 2 seconds. No rash noted. No erythema.  Psychiatric: He has a normal mood and affect. His behavior is normal.  Nursing note and vitals reviewed.    ED Treatments / Results  Labs (all labs ordered are listed, but only abnormal results are displayed) Labs Reviewed  COMPREHENSIVE METABOLIC PANEL - Abnormal; Notable for the following:       Result Value   Sodium 134 (*)    Glucose, Bld 197 (*)    Creatinine, Ser 1.78 (*)    Calcium 8.6 (*)    Albumin 3.3 (*)    GFR calc non Af Amer 39 (*)    GFR calc Af Amer 45 (*)    All other components within normal limits  CBC - Abnormal; Notable for the following:    WBC 14.3 (*)    RBC 4.07 (*)    Hemoglobin 11.8 (*)    HCT 35.9 (*)    All other components within normal limits  URINALYSIS, ROUTINE W REFLEX  MICROSCOPIC - Abnormal; Notable for the following:    Glucose, UA >=500 (*)    Hgb urine dipstick SMALL (*)    Protein, ur >=300 (*)    Bacteria, UA RARE (*)    Squamous Epithelial / LPF 0-5 (*)    All other components within normal limits  C-REACTIVE PROTEIN - Abnormal; Notable for the following:    CRP 1.2 (*)    All other components within normal limits  SEDIMENTATION RATE - Abnormal; Notable for the following:    Sed Rate 64 (*)    All other components within normal limits  LIPID PANEL - Abnormal; Notable for the following:    HDL 37 (*)    All other components within normal limits  CBC WITH DIFFERENTIAL/PLATELET - Abnormal; Notable for the following:    WBC 10.8 (*)    RBC 3.55 (*)    Hemoglobin 10.5 (*)    HCT 31.6 (*)    Monocytes Absolute 1.1 (*)    All other components within normal limits  BASIC METABOLIC PANEL - Abnormal; Notable for the following:    Potassium 3.3 (*)    Glucose, Bld 200 (*)    Creatinine, Ser 1.83 (*)    Calcium 8.3 (*)    GFR calc non Af Amer 38 (*)    GFR calc Af Amer 44 (*)    All other components within normal limits  GLUCOSE, CAPILLARY - Abnormal; Notable for the following:    Glucose-Capillary 154 (*)    All other components within normal limits  GLUCOSE, CAPILLARY - Abnormal; Notable for the following:    Glucose-Capillary 113 (*)    All other components within normal limits  CBG MONITORING, ED - Abnormal; Notable for the following:    Glucose-Capillary 237 (*)    All other components within normal limits  HIV ANTIBODY (ROUTINE TESTING)  HEMOGLOBIN A1C  CBC WITH DIFFERENTIAL/PLATELET  I-STAT TROPOININ, ED    EKG  EKG Interpretation  Date/Time:  Wednesday Nov 02 2016 21:05:20 EDT Ventricular Rate:  64 PR Interval:    QRS Duration: 164 QT Interval:  458 QTC Calculation: 473 R Axis:   72 Text Interpretation:  Sinus rhythm Probable left atrial enlargement Right bundle branch block When compared with ECG of 03/29/2016, No  significant change was found Confirmed by Delora Fuel ([id number]) on 11/02/2016 10:57:32 PM       Radiology Dg Chest 2 View  Result Date: 11/02/2016 CLINICAL DATA:  61 y/o  M; weakness. EXAM: CHEST  2 VIEW COMPARISON:  03/28/2016 chest radiograph FINDINGS: Stable heart size and mediastinal contours are within normal limits given projection and technique. Both lungs are clear. Mild thoracic spine dextrocurvature. IMPRESSION: No active cardiopulmonary disease. Electronically Signed   By: Kristine Garbe M.D.   On: 11/02/2016 20:52   Ct Head Wo Contrast  Result Date: 11/02/2016 CLINICAL DATA:  Generalized weakness.  Difficulty walking EXAM: CT HEAD WITHOUT CONTRAST TECHNIQUE: Contiguous axial images were obtained from the base of the skull through the vertex without intravenous contrast. COMPARISON:  None. FINDINGS: Brain: No acute intracranial hemorrhage. No focal mass lesion. No midline shift or mass effect. No hydrocephalus. Basilar cisterns are patent. There is hypodense region within the LEFT cerebellum adjacent the cerebral peduncle (image 11, series 3). This lesion measures approximately 1.7 x 1.4 cm. Vascular: No hyperdense vessel or unexpected calcification. Skull: Normal. Negative for fracture or focal lesion. Sinuses/Orbits: No acute finding. Other: None. IMPRESSION: Concern for LEFT cerebellar infarction.  Recommend brain MRI. These results will be called to the ordering clinician or representative by the Radiologist Assistant, and communication documented in the PACS or zVision Dashboard. Electronically Signed   By: Suzy Bouchard M.D.   On: 11/02/2016 21:51   Mr Brain Wo Contrast  Result Date: 11/03/2016 CLINICAL DATA:  Initial evaluation for acute dizziness. EXAM: MRI HEAD WITHOUT CONTRAST MRA HEAD WITHOUT CONTRAST TECHNIQUE: Multiplanar, multiecho pulse sequences of the brain and surrounding structures were obtained without intravenous contrast. Angiographic images of the head  were obtained using MRA technique without contrast. COMPARISON:  Prior CT from 11/02/2016. FINDINGS: MRI HEAD FINDINGS Brain: Cerebral volume within normal limits. Few scatter remote lacunar infarcts noted within the left basal ganglia/ corona radiata. Additional remote lacunar infarct present within the left thalamus. Patchy restricted diffusion within the superior left cerebellar hemisphere, consistent with acute ischemic infarct. Additional patchy infarct within the left aspect of the midbrain/ pons (series 5, image 20). No associated hemorrhage or mass effect. Gray-white matter differentiation otherwise maintained. No other evidence for acute or subacute ischemia. Few punctate chronic micro hemorrhages noted within the left centrum semi ovale. These may be hypertensive in nature. No mass lesion, midline shift or mass effect. Ventricles normal size without evidence for hydrocephalus. No extra-axial fluid collection. Major dural sinuses are grossly patent. Pituitary gland of normal size. Tiny 3 mm T1 hyperintense lesion within the central aspect of the pituitary gland noted, indeterminate. Midline structures intact and normal. Vascular: Major intracranial vascular flow voids are maintained. Skull and upper cervical spine: Craniocervical junction normal. Visualized upper cervical spine unremarkable. Bone marrow signal intensity within normal limits. No scalp soft tissue abnormality. Sinuses/Orbits: Globes and orbital soft tissues within normal limits. Paranasal sinuses are clear. No mastoid effusion. Inner ear structures normal. Other: None. MRA HEAD FINDINGS ANTERIOR CIRCULATION: Distal cervical segments of the internal carotid arteries are patent with antegrade flow. Petrous segments patent bilaterally without stenosis. Multifocal atheromatous irregularity present throughout the carotid siphons with moderate multifocal irregular narrowing. ICA termini patent. Left A1 segment dominant. Right A1 segment hypoplastic  and/ or absent. Atheromatous irregularity present throughout the anterior cerebral arteries without high-grade flow-limiting stenosis. ACA is are patent to their distal aspects. M1 segments patent without high-grade stenosis or occlusion. No proximal M2 occlusion. Distal small vessel  atheromatous irregularity throughout the MCA branches bilaterally. POSTERIOR CIRCULATION: Vertebral arteries code dominant and widely patent to the vertebrobasilar junction. Right PICA patent proximally. Left PICA not visualized. Basilar artery widely patent. Right SCA irregular but patent to its distal aspect. Severe stenosis present within the proximal left SCA (series 455). Flow distally is severely attenuated. PCAs arise from the basilar artery. Mild to moderate multifocal atheromatous irregularity involving the PCAs bilaterally without high-grade flow-limiting stenosis. PCAs are patent to their distal aspects. No aneurysm or vascular malformation. IMPRESSION: MRI HEAD IMPRESSION: 1. Patchy multifocal acute ischemic infarcts involving the superior left cerebellar hemisphere and left midbrain/pons. No associated hemorrhage or mass effect. 2. Scattered remote lacunar infarcts involving the bilateral basal ganglia/corona radiata as well as the left thalamus. 3. 3 mm pituitary lesion, indeterminate. Finding could be further assessed with nonemergent pituitary mass protocol MRI. MRA HEAD IMPRESSION: 1. Negative MRA for large or proximal arterial branch occlusion. 2. Severe proximal left SCA stenosis with attenuated flow distally. No other high-grade or correctable stenosis identified. 3. Additional moderate multifocal atheromatous irregularity involving the anterior and posterior circulation as above. Electronically Signed   By: Jeannine Boga M.D.   On: 11/03/2016 01:50   Dg Foot Complete Right  Result Date: 11/02/2016 CLINICAL DATA:  44 y/o  M; stepped on a roofing nail. EXAM: RIGHT FOOT COMPLETE - 3+ VIEW COMPARISON:  None.  FINDINGS: No acute fracture or dislocation. Second digit amputation absent phalanges. Lisfranc alignment is maintained. No articular abnormality is evident. IMPRESSION: No acute bony or articular abnormality identified. Electronically Signed   By: Kristine Garbe M.D.   On: 11/02/2016 20:54   Mr Jodene Nam Head/brain UR Cm  Result Date: 11/03/2016 CLINICAL DATA:  Initial evaluation for acute dizziness. EXAM: MRI HEAD WITHOUT CONTRAST MRA HEAD WITHOUT CONTRAST TECHNIQUE: Multiplanar, multiecho pulse sequences of the brain and surrounding structures were obtained without intravenous contrast. Angiographic images of the head were obtained using MRA technique without contrast. COMPARISON:  Prior CT from 11/02/2016. FINDINGS: MRI HEAD FINDINGS Brain: Cerebral volume within normal limits. Few scatter remote lacunar infarcts noted within the left basal ganglia/ corona radiata. Additional remote lacunar infarct present within the left thalamus. Patchy restricted diffusion within the superior left cerebellar hemisphere, consistent with acute ischemic infarct. Additional patchy infarct within the left aspect of the midbrain/ pons (series 5, image 20). No associated hemorrhage or mass effect. Gray-white matter differentiation otherwise maintained. No other evidence for acute or subacute ischemia. Few punctate chronic micro hemorrhages noted within the left centrum semi ovale. These may be hypertensive in nature. No mass lesion, midline shift or mass effect. Ventricles normal size without evidence for hydrocephalus. No extra-axial fluid collection. Major dural sinuses are grossly patent. Pituitary gland of normal size. Tiny 3 mm T1 hyperintense lesion within the central aspect of the pituitary gland noted, indeterminate. Midline structures intact and normal. Vascular: Major intracranial vascular flow voids are maintained. Skull and upper cervical spine: Craniocervical junction normal. Visualized upper cervical spine  unremarkable. Bone marrow signal intensity within normal limits. No scalp soft tissue abnormality. Sinuses/Orbits: Globes and orbital soft tissues within normal limits. Paranasal sinuses are clear. No mastoid effusion. Inner ear structures normal. Other: None. MRA HEAD FINDINGS ANTERIOR CIRCULATION: Distal cervical segments of the internal carotid arteries are patent with antegrade flow. Petrous segments patent bilaterally without stenosis. Multifocal atheromatous irregularity present throughout the carotid siphons with moderate multifocal irregular narrowing. ICA termini patent. Left A1 segment dominant. Right A1 segment hypoplastic and/ or absent. Atheromatous irregularity  present throughout the anterior cerebral arteries without high-grade flow-limiting stenosis. ACA is are patent to their distal aspects. M1 segments patent without high-grade stenosis or occlusion. No proximal M2 occlusion. Distal small vessel atheromatous irregularity throughout the MCA branches bilaterally. POSTERIOR CIRCULATION: Vertebral arteries code dominant and widely patent to the vertebrobasilar junction. Right PICA patent proximally. Left PICA not visualized. Basilar artery widely patent. Right SCA irregular but patent to its distal aspect. Severe stenosis present within the proximal left SCA (series 455). Flow distally is severely attenuated. PCAs arise from the basilar artery. Mild to moderate multifocal atheromatous irregularity involving the PCAs bilaterally without high-grade flow-limiting stenosis. PCAs are patent to their distal aspects. No aneurysm or vascular malformation. IMPRESSION: MRI HEAD IMPRESSION: 1. Patchy multifocal acute ischemic infarcts involving the superior left cerebellar hemisphere and left midbrain/pons. No associated hemorrhage or mass effect. 2. Scattered remote lacunar infarcts involving the bilateral basal ganglia/corona radiata as well as the left thalamus. 3. 3 mm pituitary lesion, indeterminate. Finding  could be further assessed with nonemergent pituitary mass protocol MRI. MRA HEAD IMPRESSION: 1. Negative MRA for large or proximal arterial branch occlusion. 2. Severe proximal left SCA stenosis with attenuated flow distally. No other high-grade or correctable stenosis identified. 3. Additional moderate multifocal atheromatous irregularity involving the anterior and posterior circulation as above. Electronically Signed   By: Jeannine Boga M.D.   On: 11/03/2016 01:50    Procedures Procedures (including critical care time)  Medications Ordered in ED Medications  oxyCODONE (Oxy IR/ROXICODONE) immediate release tablet 5-10 mg (5 mg Oral Given 11/03/16 0817)  polyethylene glycol (MIRALAX / GLYCOLAX) packet 17 g (17 g Oral Given 11/03/16 0816)  saccharomyces boulardii (FLORASTOR) capsule 250 mg (250 mg Oral Given 11/03/16 0818)  senna (SENOKOT) tablet 8.6 mg (8.6 mg Oral Given 11/03/16 0817)  hydrocerin (EUCERIN) cream 1 application (1 application Topical Given 11/03/16 1017)  acetaminophen (TYLENOL) tablet 650 mg (not administered)    Or  acetaminophen (TYLENOL) solution 650 mg (not administered)    Or  acetaminophen (TYLENOL) suppository 650 mg (not administered)  heparin injection 5,000 Units (5,000 Units Subcutaneous Given 11/03/16 1233)  insulin glargine (LANTUS) injection 10 Units (10 Units Subcutaneous Given 11/03/16 0117)  insulin aspart (novoLOG) injection 0-15 Units (0 Units Subcutaneous Not Given 11/03/16 1200)  insulin aspart (novoLOG) injection 0-5 Units (2 Units Subcutaneous Given 11/03/16 0123)  insulin aspart (novoLOG) injection 3 Units (3 Units Subcutaneous Given 11/03/16 1233)  labetalol (NORMODYNE,TRANDATE) injection 10 mg (10 mg Intravenous Given 11/03/16 0206)  doxazosin (CARDURA) tablet 2 mg (2 mg Oral Given 11/03/16 1016)  furosemide (LASIX) tablet 40 mg (40 mg Oral Given 11/03/16 1016)  losartan (COZAAR) tablet 50 mg (50 mg Oral Given 11/03/16 1016)  cloNIDine (CATAPRES) tablet  0.3 mg (0.3 mg Oral Given 11/03/16 1016)  glipiZIDE (GLUCOTROL) tablet 10 mg (not administered)  hydrALAZINE (APRESOLINE) tablet 100 mg (100 mg Oral Given 11/03/16 1016)  clopidogrel (PLAVIX) tablet 75 mg (75 mg Oral Given 11/03/16 1016)  atorvastatin (LIPITOR) tablet 20 mg (not administered)  labetalol (NORMODYNE,TRANDATE) injection 10 mg (10 mg Intravenous Given 11/02/16 2118)  Tdap (BOOSTRIX) injection 0.5 mL (0.5 mLs Intramuscular Given 11/02/16 2106)  labetalol (NORMODYNE,TRANDATE) injection 20 mg (20 mg Intravenous Given 11/02/16 2322)   stroke: mapping our early stages of recovery book ( Does not apply Given 11/02/16 2330)  potassium chloride SA (K-DUR,KLOR-CON) CR tablet 40 mEq (40 mEq Oral Given 11/03/16 1016)     Initial Impression / Assessment and Plan / ED Course  I have reviewed the triage vital signs and the nursing notes.  Pertinent labs & imaging results that were available during my care of the patient were reviewed by me and considered in my medical decision making (see chart for details).     Discussed with neurology who will consult on the patient. Hospitalist to admit. Given several doses of IV labetalol to treat elevated blood pressure.  Final Clinical Impressions(s) / ED Diagnoses   Final diagnoses:  Ischemic stroke ([place])  Ischemic stroke [facility])    New Prescriptions Current Discharge Medication List       Julianne Rice, MD 11/03/16 [id number]

## 2016-11-02 NOTE — H&P (Signed)
History and Physical    ZURICH CARRENO ELT:[id number] DOB: 08/12/1955 DOA: 11/02/2016  PCP: Patient, No Pcp Per   Patient coming from: Home  Chief Complaint: Loss of coordination, dizziness, difficulty ambulating, stepped on nail   HPI: Jaiceon A Ayoub is a 61 y.o. male with medical history significant for insulin-dependent diabetes mellitus, hypertension, chronic low back pain, chronic kidney disease stage III, and chronic diastolic CHF, now presenting to the emergency department for evaluation of dizziness and loss of coordination. He also notes that he stepped on a nail with his right foot on 10/28/2016. Patient reports that he was in his usual state of health until 10/28/2016 when he noted the insidious development of dizziness and difficulty ambulating. He has difficulty describing the dizziness, but notes that it sometimes feels as though the room is spinning. This has been constant for the past 4 days and he has had trouble with ambulation, reports stumbling while trying to walk. He also notes some increased difficulty over the same interval with typing on a keyboard, mainly having trouble with the left hand. He reports that he had similar symptoms approximately 1 month ago that resolved spontaneously after a couple days. He denies recent fevers or chills, denies chest pain or palpitations, and denies headache, change in vision or hearing, or focal numbness or weakness. He reports some falls related to his difficulty with ambulating, but denies hitting his head or losing consciousness, and denies any significant injury. He notes that he stepped on a nail with his right foot on 10/28/2016 and experienced immediate pain and significant bleeding from the site. Pain has steadily improved since that time and there has not been any significant swelling or erythema to the foot.   ED Course: Upon arrival to the ED, patient is found to be afebrile, saturating well on room air, and hypertensive 295/96, and  with vitals otherwise stable. EKG features a sinus rhythm with chronic right bundle branch block and no significant change from prior. Chest x-ray is negative for acute cardiopulmonary disease. Radiographs of the right foot are negative for any acute pathology. Chemistry panels notable for a sodium of 134, glucose 197, and serum creatinine 1.78, consistent with his apparent baseline. CBC is notable for a leukocytosis to 14,300 and a stable normocytic anemia with hemoglobin of 11.8. CRP and ESR are elevated to 1.2 and 64, respectively. Troponin is within the normal limits and urinalysis is not consistent with infection. CT of the head was obtained and notable for hypodense region within the left cerebellum adjacent to the cerebral peduncle concerning for an infarct. Patient remained hypertensive in the ED but otherwise stable. Neurology was consulted by the ED physician and advised for medical admission. Patient will be admitted to the telemetry unit for ongoing evaluation and management loss of coordination, suspect secondary to stroke.  Review of Systems:  All other systems reviewed and apart from HPI, are negative.  Past Medical History:  Diagnosis Date  . Asthma   . DM (diabetes mellitus) ([name])   . HTN (hypertension)   . RBBB (right bundle branch block)     Past Surgical History:  Procedure Laterality Date  . BACK SURGERY    . HAND SURGERY       reports that he has been smoking.  He has never used smokeless tobacco. He reports that he drinks alcohol. He reports that he does not use drugs.  Allergies  Allergen Reactions  . Amlodipine Besy-Benazepril Hcl Shortness Of Breath and Swelling  Mouth and tongue swelling  . Shellfish Allergy     Family History  Problem Relation Age of Onset  . Coronary artery disease Mother        MI in her 52s  . Hypertension Unknown   . Diabetes Unknown   . Alzheimer's disease Unknown      Prior to Admission medications   Medication Sig Start Date  End Date Taking? Authorizing Provider  amLODipine (NORVASC) 10 MG tablet Take 1 tablet (10 mg total) by mouth daily. 04/02/16 04/02/17  Bonnielee Haff, MD  aspirin EC 81 MG EC tablet Take 1 tablet (81 mg total) by mouth daily. 04/03/16   Bonnielee Haff, MD  carvedilol (COREG) 25 MG tablet Take 1 tablet (25 mg total) by mouth 2 (two) times daily with a meal. DO NOT TAKE TILL SEEN BY YOUR PCP. 04/02/16   Bonnielee Haff, MD  cloNIDine (CATAPRES) 0.3 MG tablet Take 1 tablet (0.3 mg total) by mouth 3 (three) times daily. 04/02/16   Bonnielee Haff, MD  glipiZIDE (GLUCOTROL) 10 MG tablet Take 10 mg by mouth daily before breakfast.    [provider]  glucose 4 GM chewable tablet Chew 1 tablet by mouth daily as needed for low blood sugar.    [provider]  hydrALAZINE (APRESOLINE) 100 MG tablet Take 1 tablet (100 mg total) by mouth 3 (three) times daily. 04/02/16   Bonnielee Haff, MD  insulin glargine (LANTUS) 100 UNIT/ML injection Inject 12-16 Units into the skin at bedtime. Sliding scale.    [provider]  oxyCODONE (OXY IR/ROXICODONE) 5 MG immediate release tablet Take 1-2 tablets (5-10 mg total) by mouth every 4 (four) hours as needed for moderate pain. 04/02/16   Bonnielee Haff, MD  polyethylene glycol [facility] / Floria Raveling) packet Take 17 g by mouth daily. 04/03/16   Bonnielee Haff, MD  saccharomyces boulardii (FLORASTOR) 250 MG capsule Take 1 capsule (250 mg total) by mouth 2 (two) times daily. 04/02/16   Bonnielee Haff, MD  senna (SENOKOT) 8.6 MG TABS tablet Take 1 tablet (8.6 mg total) by mouth daily. 04/03/16   Bonnielee Haff, MD  Skin Protectants, Misc. (EUCERIN) cream Apply 1 application topically daily. On feet    [provider]    Physical Exam: Vitals:   11/02/16 2245 11/02/16 2300 11/02/16 2315 11/02/16 2320  BP: (!) 204/98 (!) 198/104 (!) 201/99 (!) 183/99  Pulse: 89  73 73  Resp: (!) '22 14 16 ' (!) 22  Temp:      TempSrc:      SpO2: 98%   99% 97%      Constitutional: NAD, calm, comfortable Eyes: PERTLA, lids and conjunctivae normal ENMT: Mucous membranes are moist. Posterior pharynx clear of any exudate or lesions.   Neck: normal, supple, no masses, no thyromegaly Respiratory: mildly diminished breath sounds bilaterally. Clear to auscultation bilaterally, no wheezing, no crackles. Normal respiratory effort.   Cardiovascular: S1 & S2 heard, regular rate and rhythm. No significant JVD. No diaphoresis. Abdomen: No distension, no tenderness, no masses palpated. Bowel sounds normal.  Musculoskeletal: no clubbing / cyanosis. No joint deformity upper and lower extremities. Normal muscle tone.  Skin: Small puncture wound at plantar right foot without significant erythema, tenderness, swelling, or drainage. Skin is otherwise warm, dry, well-perfused. Neurologic: CN 2-12 grossly intact. Sensation to light touch intact, patellar DTRs normal. Strength 5/5 in all 4 limbs.  Psychiatric: Alert and oriented x 3. Pleasant and cooperative.     Labs on Admission: I have personally  reviewed following labs and imaging studies  CBC:  Recent Labs Lab 11/02/16 1841  WBC 14.3*  HGB 11.8*  HCT 35.9*  MCV 88.2  PLT 517   Basic Metabolic Panel:  Recent Labs Lab 11/02/16 1841  NA 134*  K 3.6  CL 102  CO2 23  GLUCOSE 197*  BUN 18  CREATININE 1.78*  CALCIUM 8.6*   GFR: CrCl cannot be calculated (Unknown ideal weight.). Liver Function Tests:  Recent Labs Lab 11/02/16 1841  AST 29  ALT 30  ALKPHOS 99  BILITOT 0.7  PROT 6.9  ALBUMIN 3.3*   No results for input(s): LIPASE, AMYLASE in the last 168 hours. No results for input(s): AMMONIA in the last 168 hours. Coagulation Profile: No results for input(s): INR, PROTIME in the last 168 hours. Cardiac Enzymes: No results for input(s): CKTOTAL, CKMB, CKMBINDEX, TROPONINI in the last 168 hours. BNP (last 3 results) No results for input(s): PROBNP in the last 8760  hours. HbA1C: No results for input(s): HGBA1C in the last 72 hours. CBG: No results for input(s): GLUCAP in the last 168 hours. Lipid Profile: No results for input(s): CHOL, HDL, LDLCALC, TRIG, CHOLHDL, LDLDIRECT in the last 72 hours. Thyroid Function Tests: No results for input(s): TSH, T4TOTAL, FREET4, T3FREE, THYROIDAB in the last 72 hours. Anemia Panel: No results for input(s): VITAMINB12, FOLATE, FERRITIN, TIBC, IRON, RETICCTPCT in the last 72 hours. Urine analysis:    Component Value Date/Time   COLORURINE YELLOW 11/02/2016 1909   APPEARANCEUR CLEAR 11/02/2016 1909   LABSPEC 1.017 11/02/2016 1909   PHURINE 5.0 11/02/2016 1909   GLUCOSEU >=500 (A) 11/02/2016 1909   HGBUR SMALL (A) 11/02/2016 1909   BILIRUBINUR NEGATIVE 11/02/2016 1909   KETONESUR NEGATIVE 11/02/2016 1909   PROTEINUR >=300 (A) 11/02/2016 1909   NITRITE NEGATIVE 11/02/2016 1909   LEUKOCYTESUR NEGATIVE 11/02/2016 1909   Sepsis Labs: '@LABRCNTIP' (procalcitonin:4,lacticidven:4) )No results found for this or any previous visit (from the past 240 hour(s)).   Radiological Exams on Admission: Dg Chest 2 View  Result Date: 11/02/2016 CLINICAL DATA:  61 y/o  M; weakness. EXAM: CHEST  2 VIEW COMPARISON:  03/28/2016 chest radiograph FINDINGS: Stable heart size and mediastinal contours are within normal limits given projection and technique. Both lungs are clear. Mild thoracic spine dextrocurvature. IMPRESSION: No active cardiopulmonary disease. Electronically Signed   By: Kristine Garbe M.D.   On: 11/02/2016 20:52   Ct Head Wo Contrast  Result Date: 11/02/2016 CLINICAL DATA:  Generalized weakness.  Difficulty walking EXAM: CT HEAD WITHOUT CONTRAST TECHNIQUE: Contiguous axial images were obtained from the base of the skull through the vertex without intravenous contrast. COMPARISON:  None. FINDINGS: Brain: No acute intracranial hemorrhage. No focal mass lesion. No midline shift or mass effect. No hydrocephalus.  Basilar cisterns are patent. There is hypodense region within the LEFT cerebellum adjacent the cerebral peduncle (image 11, series 3). This lesion measures approximately 1.7 x 1.4 cm. Vascular: No hyperdense vessel or unexpected calcification. Skull: Normal. Negative for fracture or focal lesion. Sinuses/Orbits: No acute finding. Other: None. IMPRESSION: Concern for LEFT cerebellar infarction.  Recommend brain MRI. These results will be called to the ordering clinician or representative by the Radiologist Assistant, and communication documented in the PACS or zVision Dashboard. Electronically Signed   By: Suzy Bouchard M.D.   On: 11/02/2016 21:51   Dg Foot Complete Right  Result Date: 11/02/2016 CLINICAL DATA:  60 y/o  M; stepped on a roofing nail. EXAM: RIGHT FOOT COMPLETE - 3+ VIEW COMPARISON:  None. FINDINGS: No acute fracture or dislocation. Second digit amputation absent phalanges. Lisfranc alignment is maintained. No articular abnormality is evident. IMPRESSION: No acute bony or articular abnormality identified. Electronically Signed   By: Kristine Garbe M.D.   On: 11/02/2016 20:54    EKG: Independently reviewed. Sinus rhythm, RBBB, no significant change from prior.   Assessment/Plan  1. Loss of coordination, ischemic stroke  - Pt presents with 4 days of difficulty ambulating, difficulty typing with left hand, dizziness  - Reports similar sxs 1 month ago that resolved spontaneously at that time  - Head CT with hypodensity in left cerebellum concerning for stroke  - Neurology is consulting and much appreciated; will follow-up on recommendations  - tPA not considered given presentation outside the appropriate timeframe  - Plan to monitor on telemetry with frequent neuro checks, PT/OT/SLP evals  - Obtain MRI brain, MRA head, carotid US, echocardiogram, fasting lipid panel, and A1c  - Control glucose, maintain normothermia and euvolemia  - Start prophylactic ASA  - Plan to permit  HTN to 277/412 for now pending neuro recs   2. Puncture wound, right foot  - Pt reports stepping on nail on 10/28/16  - There is mild leukocytosis and mild elevations in CRP and ESR noted, but no fever, and does not appears infected clinically  - Radiographs negative  - Tdap updated in ED  3. Hypertension with hypertensive urgency  - BP 200/100 range in ED - Managed at home with Norvasc, Coreg, clonidine, and hydralazine; these are held on admission  - Permitting HTN to 878/676 for now pending neuro recs; treat with labetalol IVP's prn   4. CKD stage III  - SCr is 1.78 on admission, consistent with apparent baseline  - Avoid nephrotoxins where feasible, avoid dehydration, renally-dose medications   5. Chronic diastolic CHF  - TTE (72/09/47) with EF 60-65%, moderate concentric and severely asymmetric hypertrophy, grade 2 diastolic dysfunction, no significant valvular disease  - Appears euvolemic on admission  - Managed with Coreg at home, no ACE/ARB or diuretic  - Plan to monitor on telemetry, follow daily wts and I/O's, update echocardiogram   6. Anemia of chronic disease  - Hgb is 11.8 on admission, stable relative to priors   7. Chronic pain  - Stable, attributed to lumbar degenerative disease, with left-sided sciatica  - Continue home regimen with oxycodone prn    DVT prophylaxis: sq heparin  Code Status: Full  Family Communication: Daughter updated at bedside  Disposition Plan: Observe on telemetry Consults called: Neurology Admission status: Observation    Vianne Bulls, MD [facility] Pager [phone]  If 7PM-7AM, please contact night-coverage [email or web address] Password [email or web address]  11/02/2016, 11:30 PM

## 2016-11-02 NOTE — ED Notes (Addendum)
Pt recently stepped on a roofing nail, reports it was squirting blood.  Pt reports weakness x5 days with dizziness and trouble moving limbs.  Pt also diagnosed with PAD at [facility].  C/o L sided back pain that radiates down hip and leg.

## 2016-11-02 NOTE — ED Triage Notes (Signed)
Pt also states the he stepped on a nail recently

## 2016-11-02 NOTE — ED Triage Notes (Signed)
Pt sts generalized weakness x 4 days with increasing difficulty walking; pt sts some lower back pain chronic in nature; pt sts not feeling well

## 2016-11-02 NOTE — ED Notes (Signed)
Pt's daughter, [name], would like updates at [phone].

## 2016-11-02 NOTE — ED Notes (Signed)
Patient transported to CT 

## 2016-11-02 NOTE — ED Notes (Signed)
Pt states he felt slightly dizzy and overall weak when standing for orthostatics. Pt was slightly unsteady on his feet.

## 2016-11-03 ENCOUNTER — Observation Stay (HOSPITAL_COMMUNITY): Payer: Medicaid Other

## 2016-11-03 ENCOUNTER — Encounter (HOSPITAL_COMMUNITY): Payer: Medicaid Other

## 2016-11-03 ENCOUNTER — Observation Stay (HOSPITAL_BASED_OUTPATIENT_CLINIC_OR_DEPARTMENT_OTHER): Payer: Medicaid Other

## 2016-11-03 ENCOUNTER — Encounter (HOSPITAL_COMMUNITY): Payer: Self-pay | Admitting: *Deleted

## 2016-11-03 DIAGNOSIS — I638 Other cerebral infarction: Secondary | ICD-10-CM | POA: Diagnosis not present

## 2016-11-03 DIAGNOSIS — E1121 Type 2 diabetes mellitus with diabetic nephropathy: Secondary | ICD-10-CM

## 2016-11-03 DIAGNOSIS — I5032 Chronic diastolic (congestive) heart failure: Secondary | ICD-10-CM | POA: Diagnosis present

## 2016-11-03 DIAGNOSIS — I10 Essential (primary) hypertension: Secondary | ICD-10-CM | POA: Diagnosis not present

## 2016-11-03 DIAGNOSIS — J45909 Unspecified asthma, uncomplicated: Secondary | ICD-10-CM | POA: Diagnosis present

## 2016-11-03 DIAGNOSIS — I6789 Other cerebrovascular disease: Secondary | ICD-10-CM

## 2016-11-03 DIAGNOSIS — I639 Cerebral infarction, unspecified: Principal | ICD-10-CM

## 2016-11-03 DIAGNOSIS — E1122 Type 2 diabetes mellitus with diabetic chronic kidney disease: Secondary | ICD-10-CM | POA: Diagnosis not present

## 2016-11-03 DIAGNOSIS — B182 Chronic viral hepatitis C: Secondary | ICD-10-CM | POA: Diagnosis not present

## 2016-11-03 DIAGNOSIS — R7309 Other abnormal glucose: Secondary | ICD-10-CM | POA: Diagnosis not present

## 2016-11-03 DIAGNOSIS — N183 Chronic kidney disease, stage 3 (moderate): Secondary | ICD-10-CM | POA: Diagnosis present

## 2016-11-03 DIAGNOSIS — I69398 Other sequelae of cerebral infarction: Secondary | ICD-10-CM | POA: Diagnosis not present

## 2016-11-03 DIAGNOSIS — E1151 Type 2 diabetes mellitus with diabetic peripheral angiopathy without gangrene: Secondary | ICD-10-CM | POA: Diagnosis present

## 2016-11-03 DIAGNOSIS — D72829 Elevated white blood cell count, unspecified: Secondary | ICD-10-CM | POA: Diagnosis present

## 2016-11-03 DIAGNOSIS — Z794 Long term (current) use of insulin: Secondary | ICD-10-CM | POA: Diagnosis not present

## 2016-11-03 DIAGNOSIS — N179 Acute kidney failure, unspecified: Secondary | ICD-10-CM | POA: Diagnosis not present

## 2016-11-03 DIAGNOSIS — F17210 Nicotine dependence, cigarettes, uncomplicated: Secondary | ICD-10-CM | POA: Diagnosis present

## 2016-11-03 DIAGNOSIS — S91331D Puncture wound without foreign body, right foot, subsequent encounter: Secondary | ICD-10-CM | POA: Diagnosis not present

## 2016-11-03 DIAGNOSIS — G8929 Other chronic pain: Secondary | ICD-10-CM | POA: Diagnosis present

## 2016-11-03 DIAGNOSIS — E876 Hypokalemia: Secondary | ICD-10-CM | POA: Diagnosis present

## 2016-11-03 DIAGNOSIS — D638 Anemia in other chronic diseases classified elsewhere: Secondary | ICD-10-CM | POA: Diagnosis present

## 2016-11-03 DIAGNOSIS — R269 Unspecified abnormalities of gait and mobility: Secondary | ICD-10-CM

## 2016-11-03 DIAGNOSIS — Z7982 Long term (current) use of aspirin: Secondary | ICD-10-CM | POA: Diagnosis not present

## 2016-11-03 DIAGNOSIS — Z91013 Allergy to seafood: Secondary | ICD-10-CM | POA: Diagnosis not present

## 2016-11-03 DIAGNOSIS — E1142 Type 2 diabetes mellitus with diabetic polyneuropathy: Secondary | ICD-10-CM | POA: Diagnosis present

## 2016-11-03 DIAGNOSIS — W228XXD Striking against or struck by other objects, subsequent encounter: Secondary | ICD-10-CM | POA: Diagnosis not present

## 2016-11-03 DIAGNOSIS — M5442 Lumbago with sciatica, left side: Secondary | ICD-10-CM | POA: Diagnosis present

## 2016-11-03 DIAGNOSIS — I160 Hypertensive urgency: Secondary | ICD-10-CM | POA: Diagnosis present

## 2016-11-03 DIAGNOSIS — I130 Hypertensive heart and chronic kidney disease with heart failure and stage 1 through stage 4 chronic kidney disease, or unspecified chronic kidney disease: Secondary | ICD-10-CM | POA: Diagnosis present

## 2016-11-03 DIAGNOSIS — Z79899 Other long term (current) drug therapy: Secondary | ICD-10-CM | POA: Diagnosis not present

## 2016-11-03 DIAGNOSIS — E113592 Type 2 diabetes mellitus with proliferative diabetic retinopathy without macular edema, left eye: Secondary | ICD-10-CM | POA: Diagnosis not present

## 2016-11-03 DIAGNOSIS — B1920 Unspecified viral hepatitis C without hepatic coma: Secondary | ICD-10-CM | POA: Diagnosis not present

## 2016-11-03 DIAGNOSIS — R270 Ataxia, unspecified: Secondary | ICD-10-CM | POA: Diagnosis not present

## 2016-11-03 DIAGNOSIS — D62 Acute posthemorrhagic anemia: Secondary | ICD-10-CM | POA: Diagnosis not present

## 2016-11-03 LAB — CBC WITH DIFFERENTIAL/PLATELET
BASOS ABS: 0 10*3/uL (ref 0.0–0.1)
BASOS PCT: 0 %
Eosinophils Absolute: 0.2 10*3/uL (ref 0.0–0.7)
Eosinophils Relative: 2 %
HEMATOCRIT: 31.6 % — AB (ref 39.0–52.0)
HEMOGLOBIN: 10.5 g/dL — AB (ref 13.0–17.0)
LYMPHS PCT: 32 %
Lymphs Abs: 3.5 10*3/uL (ref 0.7–4.0)
MCH: 29.6 pg (ref 26.0–34.0)
MCHC: 33.2 g/dL (ref 30.0–36.0)
MCV: 89 fL (ref 78.0–100.0)
MONOS PCT: 10 %
Monocytes Absolute: 1.1 10*3/uL — ABNORMAL HIGH (ref 0.1–1.0)
NEUTROS ABS: 6 10*3/uL (ref 1.7–7.7)
Neutrophils Relative %: 56 %
Platelets: 159 10*3/uL (ref 150–400)
RBC: 3.55 MIL/uL — ABNORMAL LOW (ref 4.22–5.81)
RDW: 13.9 % (ref 11.5–15.5)
WBC: 10.8 10*3/uL — ABNORMAL HIGH (ref 4.0–10.5)

## 2016-11-03 LAB — BASIC METABOLIC PANEL
ANION GAP: 6 (ref 5–15)
BUN: 19 mg/dL (ref 6–20)
CHLORIDE: 104 mmol/L (ref 101–111)
CO2: 26 mmol/L (ref 22–32)
CREATININE: 1.83 mg/dL — AB (ref 0.61–1.24)
Calcium: 8.3 mg/dL — ABNORMAL LOW (ref 8.9–10.3)
GFR calc Af Amer: 44 mL/min — ABNORMAL LOW (ref >60)
GFR calc non Af Amer: 38 mL/min — ABNORMAL LOW (ref >60)
Glucose, Bld: 200 mg/dL — ABNORMAL HIGH (ref 65–99)
POTASSIUM: 3.3 mmol/L — AB (ref 3.5–5.1)
Sodium: 136 mmol/L (ref 135–145)

## 2016-11-03 LAB — HIV ANTIBODY (ROUTINE TESTING W REFLEX): HIV SCREEN 4TH GENERATION: NONREACTIVE

## 2016-11-03 LAB — GLUCOSE, CAPILLARY
GLUCOSE-CAPILLARY: 113 mg/dL — AB (ref 65–99)
Glucose-Capillary: 154 mg/dL — ABNORMAL HIGH (ref 65–99)
Glucose-Capillary: 162 mg/dL — ABNORMAL HIGH (ref 65–99)
Glucose-Capillary: 124 mg/dL — ABNORMAL HIGH (ref 65–99)

## 2016-11-03 LAB — LIPID PANEL
Cholesterol: 149 mg/dL (ref 0–200)
HDL: 37 mg/dL — AB (ref >40)
LDL Cholesterol: 94 mg/dL (ref 0–99)
TRIGLYCERIDES: 91 mg/dL (ref <150)
Total CHOL/HDL Ratio: 4 RATIO
VLDL: 18 mg/dL (ref 0–40)

## 2016-11-03 LAB — ECHOCARDIOGRAM COMPLETE
Height: 67 in
Weight: 2611.2 oz

## 2016-11-03 LAB — CBG MONITORING, ED: GLUCOSE-CAPILLARY: 237 mg/dL — AB (ref 65–99)

## 2016-11-03 MED ORDER — GLIPIZIDE 5 MG PO TABS
10.0000 mg | ORAL_TABLET | Freq: Every day | ORAL | Status: DC
  Administered 2016-11-04: 10 mg via ORAL
  Filled 2016-11-03: qty 2

## 2016-11-03 MED ORDER — POTASSIUM CHLORIDE CRYS ER 20 MEQ PO TBCR
40.0000 meq | EXTENDED_RELEASE_TABLET | Freq: Once | ORAL | Status: AC
  Administered 2016-11-03: 40 meq via ORAL
  Filled 2016-11-03: qty 2

## 2016-11-03 MED ORDER — CLOPIDOGREL BISULFATE 75 MG PO TABS
75.0000 mg | ORAL_TABLET | Freq: Every day | ORAL | Status: DC
  Administered 2016-11-03 – 2016-11-04 (×2): 75 mg via ORAL
  Filled 2016-11-03 (×2): qty 1

## 2016-11-03 MED ORDER — LOSARTAN POTASSIUM 50 MG PO TABS
50.0000 mg | ORAL_TABLET | Freq: Every day | ORAL | Status: DC
  Administered 2016-11-03 – 2016-11-04 (×2): 50 mg via ORAL
  Filled 2016-11-03 (×2): qty 1

## 2016-11-03 MED ORDER — HYDRALAZINE HCL 50 MG PO TABS
100.0000 mg | ORAL_TABLET | Freq: Three times a day (TID) | ORAL | Status: DC
  Administered 2016-11-03 – 2016-11-04 (×5): 100 mg via ORAL
  Filled 2016-11-03 (×5): qty 2

## 2016-11-03 MED ORDER — CLONIDINE HCL 0.2 MG PO TABS
0.3000 mg | ORAL_TABLET | Freq: Three times a day (TID) | ORAL | Status: DC
  Administered 2016-11-03 – 2016-11-04 (×5): 0.3 mg via ORAL
  Filled 2016-11-03 (×5): qty 1

## 2016-11-03 MED ORDER — ATORVASTATIN CALCIUM 20 MG PO TABS
20.0000 mg | ORAL_TABLET | Freq: Every day | ORAL | Status: DC
  Administered 2016-11-03: 20 mg via ORAL
  Filled 2016-11-03: qty 1

## 2016-11-03 MED ORDER — FUROSEMIDE 40 MG PO TABS
40.0000 mg | ORAL_TABLET | Freq: Every day | ORAL | Status: DC
  Administered 2016-11-03 – 2016-11-04 (×2): 40 mg via ORAL
  Filled 2016-11-03 (×2): qty 1

## 2016-11-03 MED ORDER — DOXAZOSIN MESYLATE 2 MG PO TABS
2.0000 mg | ORAL_TABLET | Freq: Two times a day (BID) | ORAL | Status: DC
  Administered 2016-11-03 – 2016-11-04 (×3): 2 mg via ORAL
  Filled 2016-11-03 (×4): qty 1

## 2016-11-03 NOTE — Evaluation (Signed)
Occupational Therapy Evaluation Patient Details Name: John Jacobson MRN: [id number] DOB: [birth year] Today's Date: 11/03/2016    History of Present Illness Pt presented to ED with dizziness, difficulty walking, and difficulty typing with lt hand. MRI showed patchy multifocal acute ischemic infarcts involving the superior left cerebellar hemisphere and left midbrain/pons. Also showed scattered remote lacunar infarcts involving the bilateral basal ganglia/corona radiata as well as the left thalamus. PMH -  insulin-dependent diabetes mellitus, hypertension, chronic low back pain, chronic kidney disease stage III, and chronic diastolic CHF   Clinical Impression   Pt was independent prior to admission. Presents with impaired standing balance and decreased L UE coordination interfering with ability to perform self care and IADL. Recommending inpatient rehab as pt has excellent potential to return home at a modified independent level. Will follow acutely.    Follow Up Recommendations  CIR    Equipment Recommendations  3 in 1 bedside commode;Tub/shower bench    Recommendations for Other Services       Precautions / Restrictions Precautions Precautions: Fall Restrictions Weight Bearing Restrictions: No      Mobility Bed Mobility Overal bed mobility: Needs Assistance Bed Mobility: Supine to Sit     Supine to sit: Supervision;HOB elevated     General bed mobility comments: pt in chair  Transfers Overall transfer level: Needs assistance Equipment used: Rolling walker (2 wheeled) Transfers: Sit to/from Stand Sit to Stand: Min guard         General transfer comment: slow, cues for hand placement, min guard for safety    Balance Overall balance assessment: Needs assistance Sitting-balance support: No upper extremity supported Sitting balance-Leahy Scale: Good Sitting balance - Comments: no LOB with donning socks   Standing balance support: No upper extremity  supported Standing balance-Leahy Scale: Poor Standing balance comment: requires min guard to min assist at all times                           ADL either performed or assessed with clinical judgement   ADL Overall ADL's : Needs assistance/impaired Eating/Feeding: Independent;Sitting   Grooming: Wash/dry hands;Standing;Min guard   Upper Body Bathing: Set up;Sitting   Lower Body Bathing: Minimal assistance;Sit to/from stand Lower Body Bathing Details (indicate cue type and reason): able to wash feet in sitting with set up Upper Body Dressing : Set up;Sitting   Lower Body Dressing: Minimal assistance;Sit to/from stand Lower Body Dressing Details (indicate cue type and reason): able to don and doff socks Toilet Transfer: Minimal assistance;RW;Ambulation   Toileting- Clothing Manipulation and Hygiene: Minimal assistance;Sit to/from stand       Functional mobility during ADLs: Minimal assistance;Rolling walker       Vision Patient Visual Report: No change from baseline       Perception     Praxis      Pertinent Vitals/Pain Pain Assessment: No/denies pain     Hand Dominance Right   Extremity/Trunk Assessment Upper Extremity Assessment Upper Extremity Assessment: LUE deficits/detail LUE Sensation: decreased light touch (ulnar side of hand x 2 months) LUE Coordination: decreased fine motor   Lower Extremity Assessment Lower Extremity Assessment: Defer to PT evaluation RLE Coordination: decreased gross motor LLE Coordination: decreased gross motor       Communication Communication Communication: No difficulties   Cognition Arousal/Alertness: Awake/alert Behavior During Therapy: WFL for tasks assessed/performed Overall Cognitive Status: Within Functional Limits for tasks assessed  General Comments       Exercises     Shoulder Instructions      Home Living Family/patient expects to be discharged  to:: Private residence Living Arrangements: Other relatives (brother) Available Help at Discharge: Family;Available PRN/intermittently (brother just had a CABG) Type of Home: House Home Access: Level entry     Home Layout: One level     Bathroom Shower/Tub: Teacher, early years/pre: Standard     Home Equipment: None          Prior Functioning/Environment Level of Independence: Independent                 OT Problem List: Impaired balance (sitting and/or standing);Decreased coordination;Decreased knowledge of use of DME or AE;Impaired UE functional use      OT Treatment/Interventions: Self-care/ADL training;DME and/or AE instruction;Therapeutic activities;Patient/family education;Balance training;Neuromuscular education    OT Goals(Current goals can be found in the care plan section) Acute Rehab OT Goals Patient Stated Goal: return to prior level OT Goal Formulation: With patient Time For Goal Achievement: 11/17/16 Potential to Achieve Goals: Good ADL Goals Pt Will Perform Grooming: with modified independence;standing Pt Will Perform Lower Body Bathing: with modified independence;sit to/from stand Pt Will Perform Lower Body Dressing: with modified independence;sit to/from stand Pt Will Transfer to Toilet: with modified independence;ambulating;bedside commode (over toilet) Pt Will Perform Tub/Shower Transfer: Tub transfer;with modified independence;ambulating;tub bench;rolling walker Pt/caregiver will Perform Home Exercise Program: Left upper extremity;With theraputty  OT Frequency: Min 3X/week   Barriers to D/C:            Co-evaluation              AM-PAC PT "6 Clicks" Daily Activity     Outcome Measure Help from another person eating meals?: None Help from another person taking care of personal grooming?: A Little Help from another person toileting, which includes using toliet, bedpan, or urinal?: A Little Help from another person bathing  (including washing, rinsing, drying)?: A Little Help from another person to put on and taking off regular upper body clothing?: None Help from another person to put on and taking off regular lower body clothing?: A Little 6 Click Score: 20   End of Session Equipment Utilized During Treatment: Gait belt;Rolling walker  Activity Tolerance: Patient tolerated treatment well Patient left: in chair;with call bell/phone within reach;with chair alarm set;with nursing/sitter in room  OT Visit Diagnosis: Unsteadiness on feet (R26.81)                Time: 6754-4920 OT Time Calculation (min): 15 min Charges:  OT General Charges $OT Visit: 1 Procedure OT Evaluation $OT Eval Moderate Complexity: 1 Procedure G-Codes: OT G-codes **NOT FOR INPATIENT CLASS** Functional Assessment Tool Used: Clinical judgement Functional Limitation: Self care Self Care Current Status (F0071): At least 20 percent but less than 40 percent impaired, limited or restricted Self Care Goal Status (Q1975): At least 1 percent but less than 20 percent impaired, limited or restricted   Malka So 11/03/2016, 1:39 PM  [phone]

## 2016-11-03 NOTE — Evaluation (Signed)
Speech Language Pathology Evaluation Patient Details Name: John Jacobson MRN: [id number] DOB: 01-26-1956 Today's Date: 11/03/2016 Time: 1445-1500 SLP Time Calculation (min) (ACUTE ONLY): 15 min  Problem List:  Patient Active Problem List   Diagnosis Date Noted  . CKD (chronic kidney disease), stage III 11/02/2016  . Normocytic anemia 11/02/2016  . Loss of coordination 11/02/2016  . Ischemic stroke ([place]) 11/02/2016  . Puncture wound of right foot 11/02/2016  . Chronic diastolic CHF (congestive heart failure) ([place]) 11/02/2016  . Chronic left-sided low back pain 11/02/2016  . Demand ischemia ([place])   . ARF (acute renal failure) ([place])   . Hypertensive urgency 03/29/2016  . Abnormal electrocardiogram 05/24/2011  . Chest pain 05/24/2011  . Hypertension 05/24/2011  . Tobacco abuse 05/24/2011  . DM (diabetes mellitus), type 2 with renal complications ([place]) 85/63/1497   Past Medical History:  Past Medical History:  Diagnosis Date  . Asthma   . DM (diabetes mellitus) ([place])   . HTN (hypertension)   . RBBB (right bundle branch block)    Past Surgical History:  Past Surgical History:  Procedure Laterality Date  . BACK SURGERY    . HAND SURGERY     HPI:  Pt presented to ED with dizziness, difficulty walking, and difficulty typing with lt hand. MRI showed patchy multifocal acute ischemic infarcts involving the superior left cerebellar hemisphere and left midbrain/pons. Also showed scattered remote lacunar infarcts involving the bilateral basal ganglia/corona radiata as well as the left thalamus. PMH -  insulin-dependent diabetes mellitus, hypertension, chronic low back pain, chronic kidney disease stage III, and chronic diastolic CHF   Assessment / Plan / Recommendation Clinical Impression  Pt presents with normal expressive/receptive language; fluent output; no dysarthria.  + higher level attention and recall.  No acute SLP needs are identified - our services will sign off.     SLP  Assessment  SLP Recommendation/Assessment: Patient does not need any further Speech Lanaguage Pathology Services    Follow Up Recommendations  None    Frequency and Duration           SLP Evaluation Cognition  Overall Cognitive Status: Within Functional Limits for tasks assessed Arousal/Alertness: Awake/alert Orientation Level: Oriented X4 Attention: Selective Selective Attention: Appears intact Safety/Judgment: Appears intact       Comprehension  Auditory Comprehension Overall Auditory Comprehension: Appears within functional limits for tasks assessed Visual Recognition/Discrimination Discrimination: Within Function Limits Reading Comprehension Reading Status: Within funtional limits    Expression Expression Primary Mode of Expression: Verbal Verbal Expression Overall Verbal Expression: Appears within functional limits for tasks assessed Written Expression Dominant Hand: Right   Oral / Motor  Oral Motor/Sensory Function Overall Oral Motor/Sensory Function: Within functional limits Motor Speech Overall Motor Speech: Appears within functional limits for tasks assessed   GO          Functional Assessment Tool Used: clinical judgment Functional Limitations: Spoken language comprehension Spoken Language Comprehension Current Status (W2637): 0 percent impaired, limited or restricted Spoken Language Comprehension Goal Status (C5885): 0 percent impaired, limited or restricted Spoken Language Comprehension Discharge Status [phone]): 0 percent impaired, limited or restricted         Juan Quam Laurice 11/03/2016, 3:04 PM

## 2016-11-03 NOTE — ED Notes (Signed)
Pt returned from MRI °

## 2016-11-03 NOTE — Progress Notes (Signed)
Inpatient Rehabilitation  Per PT request, patient was screened by Gunnar Fusi for appropriateness for an Inpatient Acute Rehab consult.  At this time we are recommending an Inpatient Rehab consult.  Text paged MD to notify; please order if you are agreeable.    Carmelia Roller., CCC/SLP Admission Coordinator  [place]  Cell [phone]

## 2016-11-03 NOTE — Evaluation (Signed)
Physical Therapy Evaluation Patient Details Name: John Jacobson MRN: [id number] DOB: 20-Jan-1956 Today's Date: 11/03/2016   History of Present Illness  Pt presented to ED with dizziness, difficulty walking, and difficulty typing with lt hand. MRI showed patchy multifocal acute ischemic infarcts involving the superior left cerebellar hemisphere and left midbrain/pons. Also showed scattered remote lacunar infarcts involving the bilateral basal ganglia/corona radiata as well as the left thalamus. PMH -  insulin-dependent diabetes mellitus, hypertension, chronic low back pain, chronic kidney disease stage III, and chronic diastolic CHF  Clinical Impression  Pt admitted with above diagnosis and presents to PT with functional limitations due to deficits listed below (See PT problem list). Pt needs skilled PT to maximize independence and safety to allow discharge to CIR. Pt motivated to return to prior level of function. Anticipate with CIR pt could return home at modified independent level.     Follow Up Recommendations CIR    Equipment Recommendations  Other (comment) (To be determined)    Recommendations for Other Services       Precautions / Restrictions Precautions Precautions: Fall Restrictions Weight Bearing Restrictions: No      Mobility  Bed Mobility Overal bed mobility: Needs Assistance Bed Mobility: Supine to Sit     Supine to sit: Supervision;HOB elevated     General bed mobility comments: Incr time and use of rail   Transfers Overall transfer level: Needs assistance Equipment used: None Transfers: Sit to/from Stand Sit to Stand: Min assist         General transfer comment: Assist for balance  Ambulation/Gait Ambulation/Gait assistance: Mod assist Ambulation Distance (Feet): 90 Feet Assistive device: 1 person hand held assist Gait Pattern/deviations: Step-through pattern;Decreased stride length;Ataxic;Drifts right/left Gait velocity: decr Gait velocity  interpretation: Below normal speed for age/gender General Gait Details: Assist for balance and support  Stairs            Wheelchair Mobility    Modified Rankin (Stroke Patients Only) Modified Rankin (Stroke Patients Only) Pre-Morbid Rankin Score: No symptoms Modified Rankin: Moderately severe disability     Balance Overall balance assessment: Needs assistance Sitting-balance support: Bilateral upper extremity supported;No upper extremity supported Sitting balance-Leahy Scale: Fair     Standing balance support: No upper extremity supported Standing balance-Leahy Scale: Poor Standing balance comment: min guard to min A for static standing and mod A for any dynamic acitivity                             Pertinent Vitals/Pain Pain Assessment: No/denies pain    Home Living Family/patient expects to be discharged to:: Private residence Living Arrangements: Other relatives (brother) Available Help at Discharge: Family;Available PRN/intermittently (brother just had cabg) Type of Home: House Home Access: Level entry     Home Layout: One level Home Equipment: None      Prior Function Level of Independence: Independent               Hand Dominance   Dominant Hand: Right    Extremity/Trunk Assessment   Upper Extremity Assessment Upper Extremity Assessment: Defer to OT evaluation    Lower Extremity Assessment Lower Extremity Assessment: LLE deficits/detail;RLE deficits/detail RLE Coordination: decreased gross motor LLE Coordination: decreased gross motor       Communication   Communication: No difficulties  Cognition Arousal/Alertness: Awake/alert Behavior During Therapy: WFL for tasks assessed/performed Overall Cognitive Status: Within Functional Limits for tasks assessed  General Comments      Exercises     Assessment/Plan    PT Assessment Patient needs continued PT services   PT Problem List Decreased balance;Decreased mobility;Decreased coordination;Decreased activity tolerance;Decreased knowledge of use of DME       PT Treatment Interventions DME instruction;Gait training;Functional mobility training;Therapeutic activities;Therapeutic exercise;Balance training;Patient/family education    PT Goals (Current goals can be found in the Care Plan section)  Acute Rehab PT Goals Patient Stated Goal: return to prior level PT Goal Formulation: With patient Time For Goal Achievement: 11/17/16 Potential to Achieve Goals: Good    Frequency Min 4X/week   Barriers to discharge Decreased caregiver support Lives with brother who had recent CABG    Co-evaluation               AM-PAC PT "6 Clicks" Daily Activity  Outcome Measure Difficulty turning over in bed (including adjusting bedclothes, sheets and blankets)?: A Little Difficulty moving from lying on back to sitting on the side of the bed? : A Little Difficulty sitting down on and standing up from a chair with arms (e.g., wheelchair, bedside commode, etc,.)?: Total Help needed moving to and from a bed to chair (including a wheelchair)?: A Little Help needed walking in hospital room?: A Lot Help needed climbing 3-5 steps with a railing? : A Lot 6 Click Score: 14    End of Session Equipment Utilized During Treatment: Gait belt Activity Tolerance: Patient tolerated treatment well Patient left: in chair;with call bell/phone within reach;with chair alarm set Nurse Communication: Mobility status PT Visit Diagnosis: Unsteadiness on feet (R26.81);Difficulty in walking, not elsewhere classified (R26.2)    Time: 1001-1020 PT Time Calculation (min) (ACUTE ONLY): 19 min   Charges:   PT Evaluation $PT Eval Moderate Complexity: 1 Procedure     PT G CodesMarland Kitchen        [facility] PT [place] 11/03/2016, 12:12 PM

## 2016-11-03 NOTE — Progress Notes (Signed)
STROKE TEAM PROGRESS NOTE   HISTORY OF PRESENT ILLNESS (per record) John Jacobson is an 61 y.o. male who presents with a 6 day history of gait unsteadiness with weakness and incoordination on the left. Symptoms began last Friday after he stepped on a roofing nail. He also noted posterior neck pain about 2 days later. He also has experienced a sensation of dizziness that is partially vertiginous. Deficits include stumbling and incoordination while walking, as well as difficulty typing with his left hand. He has a history of lumbar back surgery with persistent left back pain which radiates down his hip and leg.  His PMHx also includes DM, HTN, CKD3, and chronic diastolic CHF.  Of note, he had similar incoordination symptoms about one month ago that lasted for 2 days and then resolved.   Home medications include ASA.    SUBJECTIVE (INTERVAL HISTORY) His daughter and RN are at the bedside.  Pt had CUS done today which did not show significant stenosis. Pt is discharging to CIR today. Still has left sided mild ataxia.    OBJECTIVE Temp:  [98.2 F (36.8 C)-98.7 F (37.1 C)] 98.2 F (36.8 C) (05/25 0434) Pulse Rate:  [51-58] 51 (05/25 0434) Cardiac Rhythm: Sinus bradycardia;Bundle branch block (05/25 0700) Resp:  [16-20] 20 (05/25 0434) BP: (134-167)/(61-78) 150/78 (05/25 0434) SpO2:  [100 %] 100 % (05/25 0434) Weight:  [80.1 kg (176 lb 9.6 oz)] 80.1 kg (176 lb 9.6 oz) (05/25 0258)  CBC:   Recent Labs Lab 11/02/16 1841 11/03/16 0629  WBC 14.3* 10.8*  NEUTROABS  --  6.0  HGB 11.8* 10.5*  HCT 35.9* 31.6*  MCV 88.2 89.0  PLT 179 202    Basic Metabolic Panel:   Recent Labs Lab 11/02/16 1841 11/03/16 0629  NA 134* 136  K 3.6 3.3*  CL 102 104  CO2 23 26  GLUCOSE 197* 200*  BUN 18 19  CREATININE 1.78* 1.83*  CALCIUM 8.6* 8.3*    Lipid Panel:     Component Value Date/Time   CHOL 149 11/03/2016 0604   TRIG 91 11/03/2016 0604   HDL 37 (L) 11/03/2016 0604   CHOLHDL 4.0  11/03/2016 0604   VLDL 18 11/03/2016 0604   LDLCALC 94 11/03/2016 0604   HgbA1c:  Lab Results  Component Value Date   HGBA1C 8.6 (H) 11/03/2016   Urine Drug Screen:     Component Value Date/Time   LABOPIA POSITIVE (A) 03/29/2016 0751   COCAINSCRNUR NONE DETECTED 03/29/2016 0751   LABBENZ NONE DETECTED 03/29/2016 0751   AMPHETMU NONE DETECTED 03/29/2016 0751   THCU NONE DETECTED 03/29/2016 0751   LABBARB NONE DETECTED 03/29/2016 0751    Alcohol Level No results found for: [place] I have personally reviewed the radiological images below and agree with the radiology interpretations.  Ct Head Wo Contrast 11/02/2016 Concern for LEFT cerebellar infarction.  Recommend brain MRI.   Mr Jodene Nam Head/brain Wo Cm 11/03/2016  MRI HEAD  1. Patchy multifocal acute ischemic infarcts involving the superior left cerebellar hemisphere and left midbrain/pons. No associated hemorrhage or mass effect.  2. Scattered remote lacunar infarcts involving the bilateral basal ganglia/corona radiata as well as the left thalamus.  3. 3 mm pituitary lesion, indeterminate. Finding could be further assessed with nonemergent pituitary mass protocol MRI.   MRA HEAD  1. Negative MRA for large or proximal arterial branch occlusion.  2. Severe proximal left SCA stenosis with attenuated flow distally. No other high-grade or correctable stenosis identified.  3.  Additional moderate multifocal atheromatous irregularity involving the anterior and posterior circulation as above.   Transthoracic Echocardiogram 11/03/2016 Study Conclusions - Left ventricle: The cavity size was normal. Wall thickness was   increased in a pattern of severe LVH. Systolic function was   normal. The estimated ejection fraction was in the range of 60%   to 65%. Wall motion was normal; there were no regional wall   motion abnormalities. Doppler parameters are consistent with   abnormal left ventricular relaxation (grade 1 diastolic    dysfunction). - Left atrium: The atrium was mildly dilated. Impressions: - Normal LV systolic function; mild diastolic dysfunction; severe   LVH with proximal septal thickening; no LVOT gradient at rest; no   SAM; mild LAE.  CUS - consistent with a 1-39 percent stenosis involving the right internal carotid artery and the left internal carotid artery. The vertebral arteries demonstrate antegrade flow.   PHYSICAL EXAM  Temp:  [97.6 F (36.4 C)-98.7 F (37.1 C)] 97.6 F (36.4 C) (05/25 1400) Pulse Rate:  [51-57] 55 (05/25 1400) Resp:  [16-20] 20 (05/25 1400) BP: (144-177)/(68-89) 177/89 (05/25 1400) SpO2:  [100 %] 100 % (05/25 1400) Weight:  [176 lb 9.6 oz (80.1 kg)] 176 lb 9.6 oz (80.1 kg) (05/25 0258)  General - Well nourished, well developed, in no apparent distress.  Ophthalmologic - Sharp disc margins OU.   Cardiovascular - Regular rate and rhythm.  Mental Status -  Level of arousal and orientation to time, place, and person were intact. Language including expression, naming, repetition, comprehension was assessed and found intact. Fund of Knowledge was assessed and was intact.  Cranial Nerves II - XII - II - Visual field intact OU. III, IV, VI - Extraocular movements intact. V - Facial sensation intact bilaterally. VII - Facial movement intact bilaterally. VIII - Hearing & vestibular intact bilaterally. X - Palate elevates symmetrically. XI - Chin turning & shoulder shrug intact bilaterally. XII - Tongue protrusion intact.  Motor Strength - The patient's strength was normal in all extremities and pronator drift was absent.  Bulk was normal and fasciculations were absent.   Motor Tone - Muscle tone was assessed at the neck and appendages and was normal.  Reflexes - The patient's reflexes were 1+ in all extremities and he had no pathological reflexes.  Sensory - Light touch, temperature/pinprick were assessed and were symmetrical.    Coordination - The patient had  dysmetria on FTN on the left and ataxia on HTS on the left.  Tremor was absent.  Gait and Station - deferred    ASSESSMENT/PLAN Mr. JASMINE MACEACHERN is a 61 y.o. male with history of right bundle branch block, chronic kidney disease, diastolic congestive heart failure, tobacco use, chronic back pain, `hypertension, diabetes, and asthma  presenting with unsteady gait, weakness, dizziness, and coordination problems on the left. He did not receive IV t-PA due to late presentation.  Stroke: left superior cerebellar artery infarct involving left cerebellum and left pontine/midbrain.  Resultant  Left sided ataxia  CT head - Concern for LEFT cerebellar infarction.  MRI head - acute ischemic infarcts involving the superior Lt cerebellar hemisphere and Lt midbrain/pons.  MRA head - negative   Carotid Doppler unremarkable  2D Echo - EF 60-65%. No cardiac source of emboli identified.  LDL - 94  HgbA1c - 8.6  VTE prophylaxis - subcutaneous heparin  Diet heart healthy/carb modified Room service appropriate? Yes; Fluid consistency: Thin  aspirin 81 mg daily prior to admission, now on ASA  325mg  and clopidogrel 75 mg daily. Recommend to continue DAPT for 3 months and then plavix alone due to intracranial stenosis.   Patient counseled to be compliant with his antithrombotic medications  Ongoing aggressive stroke risk factor management  Therapy recommendations:  CIR   Disposition: Pending  Hypertension  Stable  Permissive hypertension (OK if < 220/120) but gradually normalize in 5-7 days  Long-term BP goal normotensive  Hyperlipidemia  Home meds:  No lipid lowering medications prior to admission   LDL 94, goal < 70  Now on Lipitor 20 mg daily   Continue statin at discharge  Diabetes  HgbA1c 8.6, goal < 7.0  Uncontrolled  On lantus   Added premeal novolog  SSI  CBG monitoring  Other Stroke Risk Factors  Advanced age  ETOH use, advised to drink no more than 1 drink  per day  Hx stroke/TIA - by imaging  Other Active Problems  Mild hypokalemia - 3.3  Mild leukocytosis  Hospital day # 1  Neurology will sign off. Please call with questions. Pt will follow up with Cecille Rubin NP at [facility] in about 6 weeks. Thanks for the consult.  Rosalin Hawking, MD PhD Stroke Neurology 11/04/2016 5:30 PM   To contact Stroke Continuity provider, please refer to [email or web address] After hours, contact General Neurology

## 2016-11-03 NOTE — Consult Note (Signed)
Referring Physician: Dr. Eliseo Squires    Chief Complaint: New onset of gait unsteadiness with weakness and Incoordination on the left  HPI: John Jacobson is an 61 y.o. male who presents with a 6 day history of gait unsteadiness with weakness and Incoordination on the left. Symptoms began last Friday after he stepped on a roofing nail. He also noted posterior neck pain about 2 days later. He also has experienced a sensation of dizziness that is partially vertiginous. Deficits include stumbling and incoordination while walking, as well as difficulty typing with his left hand. He has a history of lumbar back surgery with persistent left back pain which radiates down his hip and leg.  His PMHx also includes DM, HTN, CKD3, and chronic diastolic CHF.  Of note, he had similar incoordination symptoms about one month ago that lasted for 2 days and then resolved.   Home medications include ASA.   Past Medical History:  Diagnosis Date  . Asthma   . DM (diabetes mellitus) ([place])   . HTN (hypertension)   . RBBB (right bundle branch block)     Past Surgical History:  Procedure Laterality Date  . BACK SURGERY    . HAND SURGERY      Family History  Problem Relation Age of Onset  . Coronary artery disease Mother        MI in her 53s  . Hypertension Unknown   . Diabetes Unknown   . Alzheimer's disease Unknown    Social History:  reports that he has been smoking.  He has never used smokeless tobacco. He reports that he drinks alcohol. He reports that he does not use drugs.  Allergies:  Allergies  Allergen Reactions  . Amlodipine Besy-Benazepril Hcl Shortness Of Breath and Swelling    Mouth and tongue swelling  . Shellfish Allergy Anaphylaxis    Medications:  Prior to Admission:  Prescriptions Prior to Admission  Medication Sig Dispense Refill Last Dose  . aspirin EC 81 MG EC tablet Take 1 tablet (81 mg total) by mouth daily. 30 tablet 1 11/02/2016 at Unknown time  . Carboxymethylcellulose Sod PF  0.25 % SOLN Apply 1 drop to eye 4 (four) times daily.   11/02/2016 at Unknown time  . cloNIDine (CATAPRES) 0.3 MG tablet Take 1 tablet (0.3 mg total) by mouth 3 (three) times daily. 90 tablet 1 11/02/2016 at Unknown time  . doxazosin (CARDURA) 4 MG tablet Take 2 mg by mouth 2 (two) times daily.   11/02/2016 at Unknown time  . Elbasvir-Grazoprevir 50-100 MG TABS Take 1 tablet by mouth daily.   11/02/2016 at Unknown time  . furosemide (LASIX) 40 MG tablet Take 40 mg by mouth daily.   Past Week at Unknown time  . glipiZIDE (GLUCOTROL) 10 MG tablet Take 10 mg by mouth daily before breakfast.   11/02/2016 at Unknown time  . glucose 4 GM chewable tablet Chew 1 tablet by mouth daily as needed for low blood sugar.   unk  . hydrALAZINE (APRESOLINE) 100 MG tablet Take 1 tablet (100 mg total) by mouth 3 (three) times daily. 90 tablet 1 11/02/2016 at Unknown time  . insulin glargine (LANTUS) 100 UNIT/ML injection Inject 16-19 Units into the skin at bedtime. Sliding scale.    11/01/2016 at Unknown time  . losartan (COZAAR) 50 MG tablet Take 50 mg by mouth daily.   11/01/2016 at Unknown time  . oxyCODONE (OXY IR/ROXICODONE) 5 MG immediate release tablet Take 1-2 tablets (5-10 mg total) by mouth every  4 (four) hours as needed for moderate pain. 15 tablet 0 unk  . polyethylene glycol (MIRALAX / GLYCOLAX) packet Take 17 g by mouth daily. (Patient taking differently: Take 17 g by mouth daily as needed for mild constipation. ) 14 each 0 unk  . senna (SENOKOT) 8.6 MG TABS tablet Take 1 tablet (8.6 mg total) by mouth daily. (Patient taking differently: Take 1 tablet by mouth daily as needed for mild constipation. ) 120 each 0 unk  . Skin Protectants, Misc. (EUCERIN) cream Apply 1 application topically daily. On feet   Past Month at Unknown time   Scheduled: . aspirin  300 mg Rectal Daily   Or  . aspirin  325 mg Oral Daily  . heparin  5,000 Units Subcutaneous Q8H  . hydrocerin  1 application Topical Daily  . insulin aspart   0-15 Units Subcutaneous TID WC  . insulin aspart  0-5 Units Subcutaneous QHS  . insulin aspart  3 Units Subcutaneous TID WC  . insulin glargine  10 Units Subcutaneous QHS  . polyethylene glycol  17 g Oral Daily  . saccharomyces boulardii  250 mg Oral BID  . senna  1 tablet Oral Daily   ROS: Denies chills, fever, vision changes or chest pain. Other ROS as per HPI.   Physical Examination: Blood pressure (!) 181/70, pulse 63, temperature 98.2 F (36.8 C), temperature source Oral, resp. rate (!) 24, height _0  (1.702 m), weight 74 kg (163 lb 3.2 oz), SpO2 99 %.  HEENT: [place]/AT Lungs: Respirations unlabored Ext: Warm and well perfused  Neurologic Examination: Mental Status: Alert, oriented, thought content appropriate.  Speech fluent without evidence of aphasia.  Able to follow all commands without difficulty. Cranial Nerves: II:  Visual fields intact, PERRL  III,IV, Jacobson: ptosis not present, EOMI without nystagmus V,VII: smile symmetric, facial temp sensation normal bilaterally VIII: hearing intact to conversation IX,X: no hypophonia XI: Symmetric XII: midline tongue extension  Motor: RUE 5/5 RLE: 5/5 LUE: 4+/5 LLE: 4+/5 Normal tone throughout; no atrophy noted Sensory: Mildly decreased temp sensation to LLE. FT intact x 4 without extinction.  Deep Tendon Reflexes: Hypoactive upper and lower extremity reflexes without asymmetry.  Plantars: Right: downgoing   Left: downgoing Cerebellar: Ataxia with left FNF and H-S.  Gait: Deferred  Results for orders placed or performed during the hospital encounter of 11/02/16 (from the past 48 hour(s))  Comprehensive metabolic panel     Status: Abnormal   Collection Time: 11/02/16  6:41 PM  Result Value Ref Range   Sodium 134 (L) 135 - 145 mmol/L   Potassium 3.6 3.5 - 5.1 mmol/L   Chloride 102 101 - 111 mmol/L   CO2 23 22 - 32 mmol/L   Glucose, Bld 197 (H) 65 - 99 mg/dL   BUN 18 6 - 20 mg/dL   Creatinine, Ser 1.78 (H) 0.61 - 1.24 mg/dL    Calcium 8.6 (L) 8.9 - 10.3 mg/dL   Total Protein 6.9 6.5 - 8.1 g/dL   Albumin 3.3 (L) 3.5 - 5.0 g/dL   AST 29 15 - 41 U/L   ALT 30 17 - 63 U/L   Alkaline Phosphatase 99 38 - 126 U/L   Total Bilirubin 0.7 0.3 - 1.2 mg/dL   GFR calc non Af Amer 39 (L) >60 mL/min   GFR calc Af Amer 45 (L) >60 mL/min    Comment: (NOTE) The eGFR has been calculated using the CKD EPI equation. This calculation has not been validated in all clinical  situations. eGFR's persistently <60 mL/min signify possible Chronic Kidney Disease.    Anion gap 9 5 - 15  CBC     Status: Abnormal   Collection Time: 11/02/16  6:41 PM  Result Value Ref Range   WBC 14.3 (H) 4.0 - 10.5 K/uL   RBC 4.07 (L) 4.22 - 5.81 MIL/uL   Hemoglobin 11.8 (L) 13.0 - 17.0 g/dL   HCT 35.9 (L) 39.0 - 52.0 %   MCV 88.2 78.0 - 100.0 fL   MCH 29.0 26.0 - 34.0 pg   MCHC 32.9 30.0 - 36.0 g/dL   RDW 13.6 11.5 - 15.5 %   Platelets 179 150 - 400 K/uL  Urinalysis, Routine w reflex microscopic     Status: Abnormal   Collection Time: 11/02/16  7:09 PM  Result Value Ref Range   Color, Urine YELLOW YELLOW   APPearance CLEAR CLEAR   Specific Gravity, Urine 1.017 1.005 - 1.030   pH 5.0 5.0 - 8.0   Glucose, UA >=500 (A) NEGATIVE mg/dL   Hgb urine dipstick SMALL (A) NEGATIVE   Bilirubin Urine NEGATIVE NEGATIVE   Ketones, ur NEGATIVE NEGATIVE mg/dL   Protein, ur >=300 (A) NEGATIVE mg/dL   Nitrite NEGATIVE NEGATIVE   Leukocytes, UA NEGATIVE NEGATIVE   RBC / HPF 6-30 0 - 5 RBC/hpf   WBC, UA 0-5 0 - 5 WBC/hpf   Bacteria, UA RARE (A) NONE SEEN   Squamous Epithelial / LPF 0-5 (A) NONE SEEN   Mucous PRESENT    Hyaline Casts, UA PRESENT   C-reactive protein     Status: Abnormal   Collection Time: 11/02/16  8:18 PM  Result Value Ref Range   CRP 1.2 (H) <1.0 mg/dL  Sedimentation rate     Status: Abnormal   Collection Time: 11/02/16  8:18 PM  Result Value Ref Range   Sed Rate 64 (H) 0 - 16 mm/hr  I-stat troponin, ED     Status: None   Collection  Time: 11/02/16  8:31 PM  Result Value Ref Range   Troponin i, poc 0.03 0.00 - 0.08 ng/mL   Comment 3            Comment: Due to the release kinetics of cTnI, a negative result within the first hours of the onset of symptoms does not rule out myocardial infarction with certainty. If myocardial infarction is still suspected, repeat the test at appropriate intervals.   CBG monitoring, ED     Status: Abnormal   Collection Time: 11/03/16  1:17 AM  Result Value Ref Range   Glucose-Capillary 237 (H) 65 - 99 mg/dL   Dg Chest 2 View  Result Date: 11/02/2016 CLINICAL DATA:  61 y/o  M; weakness. EXAM: CHEST  2 VIEW COMPARISON:  03/28/2016 chest radiograph FINDINGS: Stable heart size and mediastinal contours are within normal limits given projection and technique. Both lungs are clear. Mild thoracic spine dextrocurvature. IMPRESSION: No active cardiopulmonary disease. Electronically Signed   By: Kristine Garbe M.D.   On: 11/02/2016 20:52   Ct Head Wo Contrast  Result Date: 11/02/2016 CLINICAL DATA:  Generalized weakness.  Difficulty walking EXAM: CT HEAD WITHOUT CONTRAST TECHNIQUE: Contiguous axial images were obtained from the base of the skull through the vertex without intravenous contrast. COMPARISON:  None. FINDINGS: Brain: No acute intracranial hemorrhage. No focal mass lesion. No midline shift or mass effect. No hydrocephalus. Basilar cisterns are patent. There is hypodense region within the LEFT cerebellum adjacent the cerebral peduncle (image 11, series 3).  This lesion measures approximately 1.7 x 1.4 cm. Vascular: No hyperdense vessel or unexpected calcification. Skull: Normal. Negative for fracture or focal lesion. Sinuses/Orbits: No acute finding. Other: None. IMPRESSION: Concern for LEFT cerebellar infarction.  Recommend brain MRI. These results will be called to the ordering clinician or representative by the Radiologist Assistant, and communication documented in the PACS or zVision  Dashboard. Electronically Signed   By: Suzy Bouchard M.D.   On: 11/02/2016 21:51   Mr Brain Wo Contrast  Result Date: 11/03/2016 CLINICAL DATA:  Initial evaluation for acute dizziness. EXAM: MRI HEAD WITHOUT CONTRAST MRA HEAD WITHOUT CONTRAST TECHNIQUE: Multiplanar, multiecho pulse sequences of the brain and surrounding structures were obtained without intravenous contrast. Angiographic images of the head were obtained using MRA technique without contrast. COMPARISON:  Prior CT from 11/02/2016. FINDINGS: MRI HEAD FINDINGS Brain: Cerebral volume within normal limits. Few scatter remote lacunar infarcts noted within the left basal ganglia/ corona radiata. Additional remote lacunar infarct present within the left thalamus. Patchy restricted diffusion within the superior left cerebellar hemisphere, consistent with acute ischemic infarct. Additional patchy infarct within the left aspect of the midbrain/ pons (series 5, image 20). No associated hemorrhage or mass effect. Gray-white matter differentiation otherwise maintained. No other evidence for acute or subacute ischemia. Few punctate chronic micro hemorrhages noted within the left centrum semi ovale. These may be hypertensive in nature. No mass lesion, midline shift or mass effect. Ventricles normal size without evidence for hydrocephalus. No extra-axial fluid collection. Major dural sinuses are grossly patent. Pituitary gland of normal size. Tiny 3 mm T1 hyperintense lesion within the central aspect of the pituitary gland noted, indeterminate. Midline structures intact and normal. Vascular: Major intracranial vascular flow voids are maintained. Skull and upper cervical spine: Craniocervical junction normal. Visualized upper cervical spine unremarkable. Bone marrow signal intensity within normal limits. No scalp soft tissue abnormality. Sinuses/Orbits: Globes and orbital soft tissues within normal limits. Paranasal sinuses are clear. No mastoid effusion. Inner  ear structures normal. Other: None. MRA HEAD FINDINGS ANTERIOR CIRCULATION: Distal cervical segments of the internal carotid arteries are patent with antegrade flow. Petrous segments patent bilaterally without stenosis. Multifocal atheromatous irregularity present throughout the carotid siphons with moderate multifocal irregular narrowing. ICA termini patent. Left A1 segment dominant. Right A1 segment hypoplastic and/ or absent. Atheromatous irregularity present throughout the anterior cerebral arteries without high-grade flow-limiting stenosis. ACA is are patent to their distal aspects. M1 segments patent without high-grade stenosis or occlusion. No proximal M2 occlusion. Distal small vessel atheromatous irregularity throughout the MCA branches bilaterally. POSTERIOR CIRCULATION: Vertebral arteries code dominant and widely patent to the vertebrobasilar junction. Right PICA patent proximally. Left PICA not visualized. Basilar artery widely patent. Right SCA irregular but patent to its distal aspect. Severe stenosis present within the proximal left SCA (series 455). Flow distally is severely attenuated. PCAs arise from the basilar artery. Mild to moderate multifocal atheromatous irregularity involving the PCAs bilaterally without high-grade flow-limiting stenosis. PCAs are patent to their distal aspects. No aneurysm or vascular malformation. IMPRESSION: MRI HEAD IMPRESSION: 1. Patchy multifocal acute ischemic infarcts involving the superior left cerebellar hemisphere and left midbrain/pons. No associated hemorrhage or mass effect. 2. Scattered remote lacunar infarcts involving the bilateral basal ganglia/corona radiata as well as the left thalamus. 3. 3 mm pituitary lesion, indeterminate. Finding could be further assessed with nonemergent pituitary mass protocol MRI. MRA HEAD IMPRESSION: 1. Negative MRA for large or proximal arterial branch occlusion. 2. Severe proximal left SCA stenosis with attenuated flow  distally. No other high-grade or correctable stenosis identified. 3. Additional moderate multifocal atheromatous irregularity involving the anterior and posterior circulation as above. Electronically Signed   By: Jeannine Boga M.D.   On: 11/03/2016 01:50   Dg Foot Complete Right  Result Date: 11/02/2016 CLINICAL DATA:  49 y/o  M; stepped on a roofing nail. EXAM: RIGHT FOOT COMPLETE - 3+ VIEW COMPARISON:  None. FINDINGS: No acute fracture or dislocation. Second digit amputation absent phalanges. Lisfranc alignment is maintained. No articular abnormality is evident. IMPRESSION: No acute bony or articular abnormality identified. Electronically Signed   By: Kristine Garbe M.D.   On: 11/02/2016 20:54   Mr Jodene Nam Head/brain FO Cm  Result Date: 11/03/2016 CLINICAL DATA:  Initial evaluation for acute dizziness. EXAM: MRI HEAD WITHOUT CONTRAST MRA HEAD WITHOUT CONTRAST TECHNIQUE: Multiplanar, multiecho pulse sequences of the brain and surrounding structures were obtained without intravenous contrast. Angiographic images of the head were obtained using MRA technique without contrast. COMPARISON:  Prior CT from 11/02/2016. FINDINGS: MRI HEAD FINDINGS Brain: Cerebral volume within normal limits. Few scatter remote lacunar infarcts noted within the left basal ganglia/ corona radiata. Additional remote lacunar infarct present within the left thalamus. Patchy restricted diffusion within the superior left cerebellar hemisphere, consistent with acute ischemic infarct. Additional patchy infarct within the left aspect of the midbrain/ pons (series 5, image 20). No associated hemorrhage or mass effect. Gray-white matter differentiation otherwise maintained. No other evidence for acute or subacute ischemia. Few punctate chronic micro hemorrhages noted within the left centrum semi ovale. These may be hypertensive in nature. No mass lesion, midline shift or mass effect. Ventricles normal size without evidence for  hydrocephalus. No extra-axial fluid collection. Major dural sinuses are grossly patent. Pituitary gland of normal size. Tiny 3 mm T1 hyperintense lesion within the central aspect of the pituitary gland noted, indeterminate. Midline structures intact and normal. Vascular: Major intracranial vascular flow voids are maintained. Skull and upper cervical spine: Craniocervical junction normal. Visualized upper cervical spine unremarkable. Bone marrow signal intensity within normal limits. No scalp soft tissue abnormality. Sinuses/Orbits: Globes and orbital soft tissues within normal limits. Paranasal sinuses are clear. No mastoid effusion. Inner ear structures normal. Other: None. MRA HEAD FINDINGS ANTERIOR CIRCULATION: Distal cervical segments of the internal carotid arteries are patent with antegrade flow. Petrous segments patent bilaterally without stenosis. Multifocal atheromatous irregularity present throughout the carotid siphons with moderate multifocal irregular narrowing. ICA termini patent. Left A1 segment dominant. Right A1 segment hypoplastic and/ or absent. Atheromatous irregularity present throughout the anterior cerebral arteries without high-grade flow-limiting stenosis. ACA is are patent to their distal aspects. M1 segments patent without high-grade stenosis or occlusion. No proximal M2 occlusion. Distal small vessel atheromatous irregularity throughout the MCA branches bilaterally. POSTERIOR CIRCULATION: Vertebral arteries code dominant and widely patent to the vertebrobasilar junction. Right PICA patent proximally. Left PICA not visualized. Basilar artery widely patent. Right SCA irregular but patent to its distal aspect. Severe stenosis present within the proximal left SCA (series 455). Flow distally is severely attenuated. PCAs arise from the basilar artery. Mild to moderate multifocal atheromatous irregularity involving the PCAs bilaterally without high-grade flow-limiting stenosis. PCAs are patent  to their distal aspects. No aneurysm or vascular malformation. IMPRESSION: MRI HEAD IMPRESSION: 1. Patchy multifocal acute ischemic infarcts involving the superior left cerebellar hemisphere and left midbrain/pons. No associated hemorrhage or mass effect. 2. Scattered remote lacunar infarcts involving the bilateral basal ganglia/corona radiata as well as the left thalamus. 3. 3 mm pituitary lesion, indeterminate.  Finding could be further assessed with nonemergent pituitary mass protocol MRI. MRA HEAD IMPRESSION: 1. Negative MRA for large or proximal arterial branch occlusion. 2. Severe proximal left SCA stenosis with attenuated flow distally. No other high-grade or correctable stenosis identified. 3. Additional moderate multifocal atheromatous irregularity involving the anterior and posterior circulation as above. Electronically Signed   By: Jeannine Boga M.D.   On: 11/03/2016 01:50    Assessment: 61 y.o. 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 MRI brain reveals Patchy multifocal acute ischemic infarcts involving the superior left cerebellar hemisphere and left midbrain/pons. Also seen are scattered remote lacunar infarcts involving the bilateral basal ganglia/corona radiata as well as the left thalamus.  2. MRA reveals severe proximal left SCA stenosis with attenuated flow distally. No other high-grade or correctable stenosis identified. Additional moderate multifocal atheromatous irregularity involving the anterior and posterior circulation noted.  3. Classifiable as having failed ASA.  4. Stroke Risk Factors - DM and HTN 5. Leukocytosis, elevated CRP and ESR  Plan: 1. HgbA1c, fasting lipid panel 2. Telemetry monitoring 3. PT consult, OT consult, Speech consult 4. Echocardiogram 5. Carotid dopplers 6. Switch ASA to Plavix 7. Start atorvastatin 40 mg po qd 8. BP management with goal of 120/80. Out of permissive HTN time window.  9. Frequent neuro  checks 10. Tdap was updated in the ED.    _0  signed: Dr. Kerney Elbe  11/03/2016, 6:52 AM

## 2016-11-03 NOTE — Progress Notes (Signed)
PROGRESS NOTE    John Jacobson  YKD:[id number] DOB: Jun 13, 1956 DOA: 11/02/2016 PCP: Patient, No Pcp Per   Outpatient Specialists:     Brief Narrative:  John Jacobson is a 61 y.o. male with medical history significant for insulin-dependent diabetes mellitus, hypertension, chronic low back pain, chronic kidney disease stage III, and chronic diastolic CHF, now presenting to the emergency department for evaluation of dizziness and loss of coordination. He also notes that he stepped on a nail with his right foot on 10/28/2016. Patient reports that he was in his usual state of health until 10/28/2016 when he noted the insidious development of dizziness and difficulty ambulating. He has difficulty describing the dizziness, but notes that it sometimes feels as though the room is spinning. This has been constant for the past 4 days and he has had trouble with ambulation, reports stumbling while trying to walk. He also notes some increased difficulty over the same interval with typing on a keyboard, mainly having trouble with the left hand. He reports that he had similar symptoms approximately 1 month ago that resolved spontaneously after a couple days. CVA work up in progress   Assessment & Plan:   Principal Problem:   Ischemic stroke ([place]) Active Problems:   Hypertension   DM (diabetes mellitus), type 2 with renal complications ([place])   Hypertensive urgency   CKD (chronic kidney disease), stage III   Normocytic anemia   Loss of coordination   Puncture wound of right foot   Chronic diastolic CHF (congestive heart failure) ([facility])   Chronic left-sided low back pain   Subacute left sided cerebellar and brainstem ischemic infarctions - tPA not considered given presentation outside the appropriate timeframe  - Plan to monitor on telemetry with frequent neuro checks, PT/OT/SLP evals  -  MRI brain, MRA head: + for CVA - carotid US, echocardiogram, and A1c pending - plavix - per neuro outside of  the time for permissive HTN allowance-- restart home meds -LDL > 70-- start statin -tobacco cessation encouraged   Puncture wound, right foot  - Pt reports stepping on nail on 10/28/16  - There is mild leukocytosis and mild elevations in CRP and ESR noted, but no fever, and does not appears infected clinically  - Radiographs negative  - Tdap updated in ED  Hypertension with hypertensive urgency  - BP 200/100 range in ED - Managed at home with Norvasc, Coreg, clonidine, and hydralazine-- resume  CKD stage III  - SCr is 1.78 on admission, consistent with apparent baseline  - Avoid nephrotoxins where feasible  Chronic diastolic CHF  - TTE (39/76/73) with EF 60-65%, moderate concentric and severely asymmetric hypertrophy, grade 2 diastolic dysfunction, no significant valvular disease  - Appears euvolemic on admission  - Managed with Coreg at home, no ACE/ARB or diuretic  - Plan to monitor on telemetry, follow daily wts and I/O's, update echocardiogram   Anemia of chronic disease  - Hgb is 11.8 on admission, stable relative to priors   Chronic pain  - Stable, attributed to lumbar degenerative disease, with left-sided sciatica  - Continue home regimen with oxycodone prn     DVT prophylaxis:  SQ Heparin  Code Status: Full Code   Family Communication:   Disposition Plan:     Consultants:  Neuro  Subjective: Eating breakfast  Objective: Vitals:   11/03/16 0148 11/03/16 0345 11/03/16 0454 11/03/16 0806  BP: (!) 221/103 (!) 166/64 (!) 181/70 (!) 194/89  Pulse: 66 64 63 (!) 59  Resp:  20  (!) 24 16  Temp: 98.4 F (36.9 C)  98.2 F (36.8 C) 98.1 F (36.7 C)  TempSrc: Oral  Oral Oral  SpO2: 100% 99% 99% 100%  Weight:      Height:        Intake/Output Summary (Last 24 hours) at 11/03/16 0919 Last data filed at 11/03/16 0805  Gross per 24 hour  Intake              240 ml  Output              420 ml  Net             -180 ml   Filed Weights   11/03/16  0145  Weight: 74 kg (163 lb 3.2 oz)    Examination:  General exam: Appears calm and comfortable  Respiratory system: Clear to auscultation. Respiratory effort normal. Cardiovascular system: S1 & S2 heard, RRR. No JVD, murmurs, rubs, gallops or clicks. No pedal edema. Gastrointestinal system: Abdomen is nondistended, soft and nontender. No organomegaly or masses felt. Normal bowel sounds heard. Central nervous system: mild left sided weakness Psychiatry: Judgement and insight appear normal. Mood & affect appropriate.     Data Reviewed: I have personally reviewed following labs and imaging studies  CBC:  Recent Labs Lab 11/02/16 1841 11/03/16 0629  WBC 14.3* 10.8*  NEUTROABS  --  6.0  HGB 11.8* 10.5*  HCT 35.9* 31.6*  MCV 88.2 89.0  PLT 179 637   Basic Metabolic Panel:  Recent Labs Lab 11/02/16 1841 11/03/16 0629  NA 134* 136  K 3.6 3.3*  CL 102 104  CO2 23 26  GLUCOSE 197* 200*  BUN 18 19  CREATININE 1.78* 1.83*  CALCIUM 8.6* 8.3*   GFR: Estimated Creatinine Clearance: 39.1 mL/min (A) (by C-G formula based on SCr of 1.83 mg/dL (H)). Liver Function Tests:  Recent Labs Lab 11/02/16 1841  AST 29  ALT 30  ALKPHOS 99  BILITOT 0.7  PROT 6.9  ALBUMIN 3.3*   No results for input(s): LIPASE, AMYLASE in the last 168 hours. No results for input(s): AMMONIA in the last 168 hours. Coagulation Profile: No results for input(s): INR, PROTIME in the last 168 hours. Cardiac Enzymes: No results for input(s): CKTOTAL, CKMB, CKMBINDEX, TROPONINI in the last 168 hours. BNP (last 3 results) No results for input(s): PROBNP in the last 8760 hours. HbA1C: No results for input(s): HGBA1C in the last 72 hours. CBG:  Recent Labs Lab 11/03/16 0117 11/03/16 0805  GLUCAP 237* 154*   Lipid Profile:  Recent Labs  11/03/16 0604  CHOL 149  HDL 37*  LDLCALC 94  TRIG 91  CHOLHDL 4.0   Thyroid Function Tests: No results for input(s): TSH, T4TOTAL, FREET4, T3FREE,  THYROIDAB in the last 72 hours. Anemia Panel: No results for input(s): VITAMINB12, FOLATE, FERRITIN, TIBC, IRON, RETICCTPCT in the last 72 hours. Urine analysis:    Component Value Date/Time   COLORURINE YELLOW 11/02/2016 1909   APPEARANCEUR CLEAR 11/02/2016 1909   LABSPEC 1.017 11/02/2016 1909   PHURINE 5.0 11/02/2016 1909   GLUCOSEU >=500 (A) 11/02/2016 1909   HGBUR SMALL (A) 11/02/2016 1909   BILIRUBINUR NEGATIVE 11/02/2016 [place] NEGATIVE 11/02/2016 1909   PROTEINUR >=300 (A) 11/02/2016 1909   NITRITE NEGATIVE 11/02/2016 1909   LEUKOCYTESUR NEGATIVE 11/02/2016 1909     )No results found for this or any previous visit (from the past 240 hour(s)).    Anti-infectives    None  Radiology Studies: Dg Chest 2 View  Result Date: 11/02/2016 CLINICAL DATA:  60 y/o  M; weakness. EXAM: CHEST  2 VIEW COMPARISON:  03/28/2016 chest radiograph FINDINGS: Stable heart size and mediastinal contours are within normal limits given projection and technique. Both lungs are clear. Mild thoracic spine dextrocurvature. IMPRESSION: No active cardiopulmonary disease. Electronically Signed   By: Kristine Garbe M.D.   On: 11/02/2016 20:52   Ct Head Wo Contrast  Result Date: 11/02/2016 CLINICAL DATA:  Generalized weakness.  Difficulty walking EXAM: CT HEAD WITHOUT CONTRAST TECHNIQUE: Contiguous axial images were obtained from the base of the skull through the vertex without intravenous contrast. COMPARISON:  None. FINDINGS: Brain: No acute intracranial hemorrhage. No focal mass lesion. No midline shift or mass effect. No hydrocephalus. Basilar cisterns are patent. There is hypodense region within the LEFT cerebellum adjacent the cerebral peduncle (image 11, series 3). This lesion measures approximately 1.7 x 1.4 cm. Vascular: No hyperdense vessel or unexpected calcification. Skull: Normal. Negative for fracture or focal lesion. Sinuses/Orbits: No acute finding. Other: None.  IMPRESSION: Concern for LEFT cerebellar infarction.  Recommend brain MRI. These results will be called to the ordering clinician or representative by the Radiologist Assistant, and communication documented in the PACS or zVision Dashboard. Electronically Signed   By: Suzy Bouchard M.D.   On: 11/02/2016 21:51   Mr Brain Wo Contrast  Result Date: 11/03/2016 CLINICAL DATA:  Initial evaluation for acute dizziness. EXAM: MRI HEAD WITHOUT CONTRAST MRA HEAD WITHOUT CONTRAST TECHNIQUE: Multiplanar, multiecho pulse sequences of the brain and surrounding structures were obtained without intravenous contrast. Angiographic images of the head were obtained using MRA technique without contrast. COMPARISON:  Prior CT from 11/02/2016. FINDINGS: MRI HEAD FINDINGS Brain: Cerebral volume within normal limits. Few scatter remote lacunar infarcts noted within the left basal ganglia/ corona radiata. Additional remote lacunar infarct present within the left thalamus. Patchy restricted diffusion within the superior left cerebellar hemisphere, consistent with acute ischemic infarct. Additional patchy infarct within the left aspect of the midbrain/ pons (series 5, image 20). No associated hemorrhage or mass effect. Gray-white matter differentiation otherwise maintained. No other evidence for acute or subacute ischemia. Few punctate chronic micro hemorrhages noted within the left centrum semi ovale. These may be hypertensive in nature. No mass lesion, midline shift or mass effect. Ventricles normal size without evidence for hydrocephalus. No extra-axial fluid collection. Major dural sinuses are grossly patent. Pituitary gland of normal size. Tiny 3 mm T1 hyperintense lesion within the central aspect of the pituitary gland noted, indeterminate. Midline structures intact and normal. Vascular: Major intracranial vascular flow voids are maintained. Skull and upper cervical spine: Craniocervical junction normal. Visualized upper cervical  spine unremarkable. Bone marrow signal intensity within normal limits. No scalp soft tissue abnormality. Sinuses/Orbits: Globes and orbital soft tissues within normal limits. Paranasal sinuses are clear. No mastoid effusion. Inner ear structures normal. Other: None. MRA HEAD FINDINGS ANTERIOR CIRCULATION: Distal cervical segments of the internal carotid arteries are patent with antegrade flow. Petrous segments patent bilaterally without stenosis. Multifocal atheromatous irregularity present throughout the carotid siphons with moderate multifocal irregular narrowing. ICA termini patent. Left A1 segment dominant. Right A1 segment hypoplastic and/ or absent. Atheromatous irregularity present throughout the anterior cerebral arteries without high-grade flow-limiting stenosis. ACA is are patent to their distal aspects. M1 segments patent without high-grade stenosis or occlusion. No proximal M2 occlusion. Distal small vessel atheromatous irregularity throughout the MCA branches bilaterally. POSTERIOR CIRCULATION: Vertebral arteries code dominant and widely patent to the  vertebrobasilar junction. Right PICA patent proximally. Left PICA not visualized. Basilar artery widely patent. Right SCA irregular but patent to its distal aspect. Severe stenosis present within the proximal left SCA (series 455). Flow distally is severely attenuated. PCAs arise from the basilar artery. Mild to moderate multifocal atheromatous irregularity involving the PCAs bilaterally without high-grade flow-limiting stenosis. PCAs are patent to their distal aspects. No aneurysm or vascular malformation. IMPRESSION: MRI HEAD IMPRESSION: 1. Patchy multifocal acute ischemic infarcts involving the superior left cerebellar hemisphere and left midbrain/pons. No associated hemorrhage or mass effect. 2. Scattered remote lacunar infarcts involving the bilateral basal ganglia/corona radiata as well as the left thalamus. 3. 3 mm pituitary lesion, indeterminate.  Finding could be further assessed with nonemergent pituitary mass protocol MRI. MRA HEAD IMPRESSION: 1. Negative MRA for large or proximal arterial branch occlusion. 2. Severe proximal left SCA stenosis with attenuated flow distally. No other high-grade or correctable stenosis identified. 3. Additional moderate multifocal atheromatous irregularity involving the anterior and posterior circulation as above. Electronically Signed   By: Jeannine Boga M.D.   On: 11/03/2016 01:50   Dg Foot Complete Right  Result Date: 11/02/2016 CLINICAL DATA:  18 y/o  M; stepped on a roofing nail. EXAM: RIGHT FOOT COMPLETE - 3+ VIEW COMPARISON:  None. FINDINGS: No acute fracture or dislocation. Second digit amputation absent phalanges. Lisfranc alignment is maintained. No articular abnormality is evident. IMPRESSION: No acute bony or articular abnormality identified. Electronically Signed   By: Kristine Garbe M.D.   On: 11/02/2016 20:54   Mr Jodene Nam Head/brain GM Cm  Result Date: 11/03/2016 CLINICAL DATA:  Initial evaluation for acute dizziness. EXAM: MRI HEAD WITHOUT CONTRAST MRA HEAD WITHOUT CONTRAST TECHNIQUE: Multiplanar, multiecho pulse sequences of the brain and surrounding structures were obtained without intravenous contrast. Angiographic images of the head were obtained using MRA technique without contrast. COMPARISON:  Prior CT from 11/02/2016. FINDINGS: MRI HEAD FINDINGS Brain: Cerebral volume within normal limits. Few scatter remote lacunar infarcts noted within the left basal ganglia/ corona radiata. Additional remote lacunar infarct present within the left thalamus. Patchy restricted diffusion within the superior left cerebellar hemisphere, consistent with acute ischemic infarct. Additional patchy infarct within the left aspect of the midbrain/ pons (series 5, image 20). No associated hemorrhage or mass effect. Gray-white matter differentiation otherwise maintained. No other evidence for acute or  subacute ischemia. Few punctate chronic micro hemorrhages noted within the left centrum semi ovale. These may be hypertensive in nature. No mass lesion, midline shift or mass effect. Ventricles normal size without evidence for hydrocephalus. No extra-axial fluid collection. Major dural sinuses are grossly patent. Pituitary gland of normal size. Tiny 3 mm T1 hyperintense lesion within the central aspect of the pituitary gland noted, indeterminate. Midline structures intact and normal. Vascular: Major intracranial vascular flow voids are maintained. Skull and upper cervical spine: Craniocervical junction normal. Visualized upper cervical spine unremarkable. Bone marrow signal intensity within normal limits. No scalp soft tissue abnormality. Sinuses/Orbits: Globes and orbital soft tissues within normal limits. Paranasal sinuses are clear. No mastoid effusion. Inner ear structures normal. Other: None. MRA HEAD FINDINGS ANTERIOR CIRCULATION: Distal cervical segments of the internal carotid arteries are patent with antegrade flow. Petrous segments patent bilaterally without stenosis. Multifocal atheromatous irregularity present throughout the carotid siphons with moderate multifocal irregular narrowing. ICA termini patent. Left A1 segment dominant. Right A1 segment hypoplastic and/ or absent. Atheromatous irregularity present throughout the anterior cerebral arteries without high-grade flow-limiting stenosis. ACA is are patent to their distal aspects.  M1 segments patent without high-grade stenosis or occlusion. No proximal M2 occlusion. Distal small vessel atheromatous irregularity throughout the MCA branches bilaterally. POSTERIOR CIRCULATION: Vertebral arteries code dominant and widely patent to the vertebrobasilar junction. Right PICA patent proximally. Left PICA not visualized. Basilar artery widely patent. Right SCA irregular but patent to its distal aspect. Severe stenosis present within the proximal left SCA  (series 455). Flow distally is severely attenuated. PCAs arise from the basilar artery. Mild to moderate multifocal atheromatous irregularity involving the PCAs bilaterally without high-grade flow-limiting stenosis. PCAs are patent to their distal aspects. No aneurysm or vascular malformation. IMPRESSION: MRI HEAD IMPRESSION: 1. Patchy multifocal acute ischemic infarcts involving the superior left cerebellar hemisphere and left midbrain/pons. No associated hemorrhage or mass effect. 2. Scattered remote lacunar infarcts involving the bilateral basal ganglia/corona radiata as well as the left thalamus. 3. 3 mm pituitary lesion, indeterminate. Finding could be further assessed with nonemergent pituitary mass protocol MRI. MRA HEAD IMPRESSION: 1. Negative MRA for large or proximal arterial branch occlusion. 2. Severe proximal left SCA stenosis with attenuated flow distally. No other high-grade or correctable stenosis identified. 3. Additional moderate multifocal atheromatous irregularity involving the anterior and posterior circulation as above. Electronically Signed   By: Jeannine Boga M.D.   On: 11/03/2016 01:50        Scheduled Meds: . aspirin  300 mg Rectal Daily   Or  . aspirin  325 mg Oral Daily  . heparin  5,000 Units Subcutaneous Q8H  . hydrocerin  1 application Topical Daily  . insulin aspart  0-15 Units Subcutaneous TID WC  . insulin aspart  0-5 Units Subcutaneous QHS  . insulin aspart  3 Units Subcutaneous TID WC  . insulin glargine  10 Units Subcutaneous QHS  . polyethylene glycol  17 g Oral Daily  . saccharomyces boulardii  250 mg Oral BID  . senna  1 tablet Oral Daily   Continuous Infusions:   LOS: 0 days    Time spent: 25 min    [place], DO [facility] Pager [phone]  If 7PM-7AM, please contact night-coverage [email or web address] Password [facility] 11/03/2016, 9:19 AM

## 2016-11-03 NOTE — Consult Note (Signed)
Physical Medicine and Rehabilitation Consult Reason for Consult: Gait disorder with dizziness Referring Physician: [facility]   HPI: John Jacobson is a 61 y.o. right handed male with history of asthma, diabetes mellitus, hypertension, right bundle branch block, tobacco abuse, CKD stage III, chronic diastolic congestive heart failure. 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 One level home. Brother recently had CABG. Presented 11/02/2016 with dizziness as well as gait disturbance and left-sided numbness over the past 4 days as well as reported fall. Patient also reported that he had stepped on a nail with his right foot. CT/MRI showed patchy multifocal acute ischemic infarcts involving the superior left cerebellar hemisphere and left midbrain/pons. No associated hemorrhage or mass effect. Scattered remote lacunar infarcts involving the bilateral basal ganglia corona radiata as well as left thalamus. 3 mm pituitary lesion indeterminate. MRA negative for large vessel occlusion. Patient did not receive TPA. Echocardiogram pending. Neurology consulted presently on Plavix for CVA prophylaxis. Tolerating a regular diet. Subcutaneous heparin added for DVT prophylaxis. Physical therapy evaluation completed 11/03/2016 with recommendations of physical medicine rehabilitation consult.  Two-month history of numbness in the fourth and fifth digits of left hand, leans on the left elbow habitually while using computer  Review of Systems  Constitutional: Negative for chills and fever.  HENT: Negative for hearing loss.   Eyes: Negative for blurred vision and double vision.  Respiratory: Negative for cough and shortness of breath.   Cardiovascular: Negative for chest pain, palpitations and leg swelling.  Gastrointestinal: Positive for constipation. Negative for nausea.  Genitourinary: Negative for dysuria, flank pain  and hematuria.  Musculoskeletal: Positive for back pain and myalgias.  Skin: Negative for rash.  Neurological: Positive for dizziness and focal weakness. Negative for seizures.  All other systems reviewed and are negative.  Past Medical History:  Diagnosis Date  . Asthma   . DM (diabetes mellitus) ([place])   . HTN (hypertension)   . RBBB (right bundle branch block)    Past Surgical History:  Procedure Laterality Date  . BACK SURGERY    . HAND SURGERY     Family History  Problem Relation Age of Onset  . Coronary artery disease Mother        MI in her 30s  . Hypertension Unknown   . Diabetes Unknown   . Alzheimer's disease Unknown    Social History:  reports that he has been smoking.  He has never used smokeless tobacco. He reports that he drinks alcohol. He reports that he does not use drugs. Allergies:  Allergies  Allergen Reactions  . Amlodipine Besy-Benazepril Hcl Shortness Of Breath and Swelling    Mouth and tongue swelling  . Shellfish Allergy Anaphylaxis   Medications Prior to Admission  Medication Sig Dispense Refill  . aspirin EC 81 MG EC tablet Take 1 tablet (81 mg total) by mouth daily. 30 tablet 1  . Carboxymethylcellulose Sod PF 0.25 % SOLN Apply 1 drop to eye 4 (four) times daily.    . cloNIDine (CATAPRES) 0.3 MG tablet Take 1 tablet (0.3 mg total) by mouth 3 (three) times daily. 90 tablet 1  . doxazosin (CARDURA) 4 MG tablet Take 2 mg by mouth 2 (two) times daily.    . Elbasvir-Grazoprevir 50-100 MG TABS Take 1 tablet by mouth daily.    . furosemide (LASIX) 40 MG tablet Take 40 mg by mouth daily.    Marland Kitchen glipiZIDE (  GLUCOTROL) 10 MG tablet Take 10 mg by mouth daily before breakfast.    . glucose 4 GM chewable tablet Chew 1 tablet by mouth daily as needed for low blood sugar.    . hydrALAZINE (APRESOLINE) 100 MG tablet Take 1 tablet (100 mg total) by mouth 3 (three) times daily. 90 tablet 1  . insulin glargine (LANTUS) 100 UNIT/ML injection Inject 16-19 Units into  the skin at bedtime. Sliding scale.     . losartan (COZAAR) 50 MG tablet Take 50 mg by mouth daily.    Marland Kitchen oxyCODONE (OXY IR/ROXICODONE) 5 MG immediate release tablet Take 1-2 tablets (5-10 mg total) by mouth every 4 (four) hours as needed for moderate pain. 15 tablet 0  . polyethylene glycol (MIRALAX / GLYCOLAX) packet Take 17 g by mouth daily. (Patient taking differently: Take 17 g by mouth daily as needed for mild constipation. ) 14 each 0  . senna (SENOKOT) 8.6 MG TABS tablet Take 1 tablet (8.6 mg total) by mouth daily. (Patient taking differently: Take 1 tablet by mouth daily as needed for mild constipation. ) 120 each 0  . Skin Protectants, Misc. (EUCERIN) cream Apply 1 application topically daily. On feet      Home: Home Living Family/patient expects to be discharged to:: Private residence Living Arrangements: Other relatives (brother) Available Help at Discharge: Family, Available PRN/intermittently (brother just had cabg) Type of Home: House Home Access: Level entry Home Layout: One level Home Equipment: None  Functional History: Prior Function Level of Independence: Independent Functional Status:  Mobility: Bed Mobility Overal bed mobility: Needs Assistance Bed Mobility: Supine to Sit Supine to sit: Supervision, HOB elevated General bed mobility comments: Incr time and use of rail  Transfers Overall transfer level: Needs assistance Equipment used: None Transfers: Sit to/from Stand Sit to Stand: Min assist General transfer comment: Assist for balance Ambulation/Gait Ambulation/Gait assistance: Mod assist Ambulation Distance (Feet): 90 Feet Assistive device: 1 person hand held assist Gait Pattern/deviations: Step-through pattern, Decreased stride length, Ataxic, Drifts right/left General Gait Details: Assist for balance and support Gait velocity: decr Gait velocity interpretation: Below normal speed for age/gender    ADL:    Cognition: Cognition Overall Cognitive  Status: Within Functional Limits for tasks assessed Orientation Level: Oriented X4 Cognition Arousal/Alertness: Awake/alert Behavior During Therapy: WFL for tasks assessed/performed Overall Cognitive Status: Within Functional Limits for tasks assessed  Blood pressure (!) 175/70, pulse 65, temperature 98.1 F (36.7 C), temperature source Oral, resp. rate 16, height 5\' 7"  (1.702 m), weight 74 kg (163 lb 3.2 oz), SpO2 99 %. Physical Exam  Vitals reviewed. Constitutional: He is oriented to person, place, and time. He appears well-developed.  HENT:  Head: Normocephalic.  Eyes: EOM are normal.  Neck: Normal range of motion. Neck supple. No thyromegaly present.  Cardiovascular: Normal rate and regular rhythm.   Respiratory: Effort normal and breath sounds normal. No respiratory distress.  GI: Soft. Bowel sounds are normal. He exhibits no distension.  Neurological: He is alert and oriented to person, place, and time.  Follows simple commands. Incoordination left upper extremity  Skin: Skin is warm and dry.  , Ataxia, left finger-nose-finger left. Heel to shin. No ataxia on the right side. Motor strength is 5/5 bilateral deltoid, bicep, tricep. 5/5 right. Grip 4/5 left grip 4/5, left intrinsics 5/5, right intrinsics. Left hand intrinsic atrophy. Tinel's at the elbow on the left side. 5/5 strength bilateral hip flexor, knee extensor, ankle dorsiflexor, plantar flexor Sensation intact to light touch bilateral upper and  lower extremities, no facial numbness  Results for orders placed or performed during the hospital encounter of 11/02/16 (from the past 24 hour(s))  Comprehensive metabolic panel     Status: Abnormal   Collection Time: 11/02/16  6:41 PM  Result Value Ref Range   Sodium 134 (L) 135 - 145 mmol/L   Potassium 3.6 3.5 - 5.1 mmol/L   Chloride 102 101 - 111 mmol/L   CO2 23 22 - 32 mmol/L   Glucose, Bld 197 (H) 65 - 99 mg/dL   BUN 18 6 - 20 mg/dL   Creatinine, Ser 1.78 (H) 0.61 -  1.24 mg/dL   Calcium 8.6 (L) 8.9 - 10.3 mg/dL   Total Protein 6.9 6.5 - 8.1 g/dL   Albumin 3.3 (L) 3.5 - 5.0 g/dL   AST 29 15 - 41 U/L   ALT 30 17 - 63 U/L   Alkaline Phosphatase 99 38 - 126 U/L   Total Bilirubin 0.7 0.3 - 1.2 mg/dL   GFR calc non Af Amer 39 (L) >60 mL/min   GFR calc Af Amer 45 (L) >60 mL/min   Anion gap 9 5 - 15  CBC     Status: Abnormal   Collection Time: 11/02/16  6:41 PM  Result Value Ref Range   WBC 14.3 (H) 4.0 - 10.5 K/uL   RBC 4.07 (L) 4.22 - 5.81 MIL/uL   Hemoglobin 11.8 (L) 13.0 - 17.0 g/dL   HCT 35.9 (L) 39.0 - 52.0 %   MCV 88.2 78.0 - 100.0 fL   MCH 29.0 26.0 - 34.0 pg   MCHC 32.9 30.0 - 36.0 g/dL   RDW 13.6 11.5 - 15.5 %   Platelets 179 150 - 400 K/uL  Urinalysis, Routine w reflex microscopic     Status: Abnormal   Collection Time: 11/02/16  7:09 PM  Result Value Ref Range   Color, Urine YELLOW YELLOW   APPearance CLEAR CLEAR   Specific Gravity, Urine 1.017 1.005 - 1.030   pH 5.0 5.0 - 8.0   Glucose, UA >=500 (A) NEGATIVE mg/dL   Hgb urine dipstick SMALL (A) NEGATIVE   Bilirubin Urine NEGATIVE NEGATIVE   Ketones, ur NEGATIVE NEGATIVE mg/dL   Protein, ur >=300 (A) NEGATIVE mg/dL   Nitrite NEGATIVE NEGATIVE   Leukocytes, UA NEGATIVE NEGATIVE   RBC / HPF 6-30 0 - 5 RBC/hpf   WBC, UA 0-5 0 - 5 WBC/hpf   Bacteria, UA RARE (A) NONE SEEN   Squamous Epithelial / LPF 0-5 (A) NONE SEEN   Mucous PRESENT    Hyaline Casts, UA PRESENT   C-reactive protein     Status: Abnormal   Collection Time: 11/02/16  8:18 PM  Result Value Ref Range   CRP 1.2 (H) <1.0 mg/dL  Sedimentation rate     Status: Abnormal   Collection Time: 11/02/16  8:18 PM  Result Value Ref Range   Sed Rate 64 (H) 0 - 16 mm/hr  I-stat troponin, ED     Status: None   Collection Time: 11/02/16  8:31 PM  Result Value Ref Range   Troponin i, poc 0.03 0.00 - 0.08 ng/mL   Comment 3          CBG monitoring, ED     Status: Abnormal   Collection Time: 11/03/16  1:17 AM  Result Value Ref  Range   Glucose-Capillary 237 (H) 65 - 99 mg/dL  Lipid panel     Status: Abnormal   Collection Time: 11/03/16  6:04 AM  Result Value Ref Range   Cholesterol 149 0 - 200 mg/dL   Triglycerides 91 <150 mg/dL   HDL 37 (L) >40 mg/dL   Total CHOL/HDL Ratio 4.0 RATIO   VLDL 18 0 - 40 mg/dL   LDL Cholesterol 94 0 - 99 mg/dL  CBC with Differential/Platelet     Status: Abnormal   Collection Time: 11/03/16  6:29 AM  Result Value Ref Range   WBC 10.8 (H) 4.0 - 10.5 K/uL   RBC 3.55 (L) 4.22 - 5.81 MIL/uL   Hemoglobin 10.5 (L) 13.0 - 17.0 g/dL   HCT 31.6 (L) 39.0 - 52.0 %   MCV 89.0 78.0 - 100.0 fL   MCH 29.6 26.0 - 34.0 pg   MCHC 33.2 30.0 - 36.0 g/dL   RDW 13.9 11.5 - 15.5 %   Platelets 159 150 - 400 K/uL   Neutrophils Relative % 56 %   Lymphocytes Relative 32 %   Monocytes Relative 10 %   Eosinophils Relative 2 %   Basophils Relative 0 %   Neutro Abs 6.0 1.7 - 7.7 K/uL   Lymphs Abs 3.5 0.7 - 4.0 K/uL   Monocytes Absolute 1.1 (H) 0.1 - 1.0 K/uL   Eosinophils Absolute 0.2 0.0 - 0.7 K/uL   Basophils Absolute 0.0 0.0 - 0.1 K/uL   WBC Morphology ATYPICAL LYMPHOCYTES   Basic metabolic panel     Status: Abnormal   Collection Time: 11/03/16  6:29 AM  Result Value Ref Range   Sodium 136 135 - 145 mmol/L   Potassium 3.3 (L) 3.5 - 5.1 mmol/L   Chloride 104 101 - 111 mmol/L   CO2 26 22 - 32 mmol/L   Glucose, Bld 200 (H) 65 - 99 mg/dL   BUN 19 6 - 20 mg/dL   Creatinine, Ser 1.83 (H) 0.61 - 1.24 mg/dL   Calcium 8.3 (L) 8.9 - 10.3 mg/dL   GFR calc non Af Amer 38 (L) >60 mL/min   GFR calc Af Amer 44 (L) >60 mL/min   Anion gap 6 5 - 15  Glucose, capillary     Status: Abnormal   Collection Time: 11/03/16  8:05 AM  Result Value Ref Range   Glucose-Capillary 154 (H) 65 - 99 mg/dL  Glucose, capillary     Status: Abnormal   Collection Time: 11/03/16 11:36 AM  Result Value Ref Range   Glucose-Capillary 113 (H) 65 - 99 mg/dL   Dg Chest 2 View  Result Date: 11/02/2016 CLINICAL DATA:  61 y/o   M; weakness. EXAM: CHEST  2 VIEW COMPARISON:  03/28/2016 chest radiograph FINDINGS: Stable heart size and mediastinal contours are within normal limits given projection and technique. Both lungs are clear. Mild thoracic spine dextrocurvature. IMPRESSION: No active cardiopulmonary disease. Electronically Signed   By: Kristine Garbe M.D.   On: 11/02/2016 20:52   Ct Head Wo Contrast  Result Date: 11/02/2016 CLINICAL DATA:  Generalized weakness.  Difficulty walking EXAM: CT HEAD WITHOUT CONTRAST TECHNIQUE: Contiguous axial images were obtained from the base of the skull through the vertex without intravenous contrast. COMPARISON:  None. FINDINGS: Brain: No acute intracranial hemorrhage. No focal mass lesion. No midline shift or mass effect. No hydrocephalus. Basilar cisterns are patent. There is hypodense region within the LEFT cerebellum adjacent the cerebral peduncle (image 11, series 3). This lesion measures approximately 1.7 x 1.4 cm. Vascular: No hyperdense vessel or unexpected calcification. Skull: Normal. Negative for fracture or focal lesion. Sinuses/Orbits: No acute finding. Other: None. IMPRESSION: Concern for  LEFT cerebellar infarction.  Recommend brain MRI. These results will be called to the ordering clinician or representative by the Radiologist Assistant, and communication documented in the PACS or zVision Dashboard. Electronically Signed   By: Suzy Bouchard M.D.   On: 11/02/2016 21:51   Mr Brain Wo Contrast  Result Date: 11/03/2016 CLINICAL DATA:  Initial evaluation for acute dizziness. EXAM: MRI HEAD WITHOUT CONTRAST MRA HEAD WITHOUT CONTRAST TECHNIQUE: Multiplanar, multiecho pulse sequences of the brain and surrounding structures were obtained without intravenous contrast. Angiographic images of the head were obtained using MRA technique without contrast. COMPARISON:  Prior CT from 11/02/2016. FINDINGS: MRI HEAD FINDINGS Brain: Cerebral volume within normal limits. Few scatter  remote lacunar infarcts noted within the left basal ganglia/ corona radiata. Additional remote lacunar infarct present within the left thalamus. Patchy restricted diffusion within the superior left cerebellar hemisphere, consistent with acute ischemic infarct. Additional patchy infarct within the left aspect of the midbrain/ pons (series 5, image 20). No associated hemorrhage or mass effect. Gray-white matter differentiation otherwise maintained. No other evidence for acute or subacute ischemia. Few punctate chronic micro hemorrhages noted within the left centrum semi ovale. These may be hypertensive in nature. No mass lesion, midline shift or mass effect. Ventricles normal size without evidence for hydrocephalus. No extra-axial fluid collection. Major dural sinuses are grossly patent. Pituitary gland of normal size. Tiny 3 mm T1 hyperintense lesion within the central aspect of the pituitary gland noted, indeterminate. Midline structures intact and normal. Vascular: Major intracranial vascular flow voids are maintained. Skull and upper cervical spine: Craniocervical junction normal. Visualized upper cervical spine unremarkable. Bone marrow signal intensity within normal limits. No scalp soft tissue abnormality. Sinuses/Orbits: Globes and orbital soft tissues within normal limits. Paranasal sinuses are clear. No mastoid effusion. Inner ear structures normal. Other: None. MRA HEAD FINDINGS ANTERIOR CIRCULATION: Distal cervical segments of the internal carotid arteries are patent with antegrade flow. Petrous segments patent bilaterally without stenosis. Multifocal atheromatous irregularity present throughout the carotid siphons with moderate multifocal irregular narrowing. ICA termini patent. Left A1 segment dominant. Right A1 segment hypoplastic and/ or absent. Atheromatous irregularity present throughout the anterior cerebral arteries without high-grade flow-limiting stenosis. ACA is are patent to their distal  aspects. M1 segments patent without high-grade stenosis or occlusion. No proximal M2 occlusion. Distal small vessel atheromatous irregularity throughout the MCA branches bilaterally. POSTERIOR CIRCULATION: Vertebral arteries code dominant and widely patent to the vertebrobasilar junction. Right PICA patent proximally. Left PICA not visualized. Basilar artery widely patent. Right SCA irregular but patent to its distal aspect. Severe stenosis present within the proximal left SCA (series 455). Flow distally is severely attenuated. PCAs arise from the basilar artery. Mild to moderate multifocal atheromatous irregularity involving the PCAs bilaterally without high-grade flow-limiting stenosis. PCAs are patent to their distal aspects. No aneurysm or vascular malformation. IMPRESSION: MRI HEAD IMPRESSION: 1. Patchy multifocal acute ischemic infarcts involving the superior left cerebellar hemisphere and left midbrain/pons. No associated hemorrhage or mass effect. 2. Scattered remote lacunar infarcts involving the bilateral basal ganglia/corona radiata as well as the left thalamus. 3. 3 mm pituitary lesion, indeterminate. Finding could be further assessed with nonemergent pituitary mass protocol MRI. MRA HEAD IMPRESSION: 1. Negative MRA for large or proximal arterial branch occlusion. 2. Severe proximal left SCA stenosis with attenuated flow distally. No other high-grade or correctable stenosis identified. 3. Additional moderate multifocal atheromatous irregularity involving the anterior and posterior circulation as above. Electronically Signed   By: Pincus Badder.D.  On: 11/03/2016 01:50   Dg Foot Complete Right  Result Date: 11/02/2016 CLINICAL DATA:  32 y/o  M; stepped on a roofing nail. EXAM: RIGHT FOOT COMPLETE - 3+ VIEW COMPARISON:  None. FINDINGS: No acute fracture or dislocation. Second digit amputation absent phalanges. Lisfranc alignment is maintained. No articular abnormality is evident. IMPRESSION:  No acute bony or articular abnormality identified. Electronically Signed   By: Kristine Garbe M.D.   On: 11/02/2016 20:54   Mr Jodene Nam Head/brain SN Cm  Result Date: 11/03/2016 CLINICAL DATA:  Initial evaluation for acute dizziness. EXAM: MRI HEAD WITHOUT CONTRAST MRA HEAD WITHOUT CONTRAST TECHNIQUE: Multiplanar, multiecho pulse sequences of the brain and surrounding structures were obtained without intravenous contrast. Angiographic images of the head were obtained using MRA technique without contrast. COMPARISON:  Prior CT from 11/02/2016. FINDINGS: MRI HEAD FINDINGS Brain: Cerebral volume within normal limits. Few scatter remote lacunar infarcts noted within the left basal ganglia/ corona radiata. Additional remote lacunar infarct present within the left thalamus. Patchy restricted diffusion within the superior left cerebellar hemisphere, consistent with acute ischemic infarct. Additional patchy infarct within the left aspect of the midbrain/ pons (series 5, image 20). No associated hemorrhage or mass effect. Gray-white matter differentiation otherwise maintained. No other evidence for acute or subacute ischemia. Few punctate chronic micro hemorrhages noted within the left centrum semi ovale. These may be hypertensive in nature. No mass lesion, midline shift or mass effect. Ventricles normal size without evidence for hydrocephalus. No extra-axial fluid collection. Major dural sinuses are grossly patent. Pituitary gland of normal size. Tiny 3 mm T1 hyperintense lesion within the central aspect of the pituitary gland noted, indeterminate. Midline structures intact and normal. Vascular: Major intracranial vascular flow voids are maintained. Skull and upper cervical spine: Craniocervical junction normal. Visualized upper cervical spine unremarkable. Bone marrow signal intensity within normal limits. No scalp soft tissue abnormality. Sinuses/Orbits: Globes and orbital soft tissues within normal limits.  Paranasal sinuses are clear. No mastoid effusion. Inner ear structures normal. Other: None. MRA HEAD FINDINGS ANTERIOR CIRCULATION: Distal cervical segments of the internal carotid arteries are patent with antegrade flow. Petrous segments patent bilaterally without stenosis. Multifocal atheromatous irregularity present throughout the carotid siphons with moderate multifocal irregular narrowing. ICA termini patent. Left A1 segment dominant. Right A1 segment hypoplastic and/ or absent. Atheromatous irregularity present throughout the anterior cerebral arteries without high-grade flow-limiting stenosis. ACA is are patent to their distal aspects. M1 segments patent without high-grade stenosis or occlusion. No proximal M2 occlusion. Distal small vessel atheromatous irregularity throughout the MCA branches bilaterally. POSTERIOR CIRCULATION: Vertebral arteries code dominant and widely patent to the vertebrobasilar junction. Right PICA patent proximally. Left PICA not visualized. Basilar artery widely patent. Right SCA irregular but patent to its distal aspect. Severe stenosis present within the proximal left SCA (series 455). Flow distally is severely attenuated. PCAs arise from the basilar artery. Mild to moderate multifocal atheromatous irregularity involving the PCAs bilaterally without high-grade flow-limiting stenosis. PCAs are patent to their distal aspects. No aneurysm or vascular malformation. IMPRESSION: MRI HEAD IMPRESSION: 1. Patchy multifocal acute ischemic infarcts involving the superior left cerebellar hemisphere and left midbrain/pons. No associated hemorrhage or mass effect. 2. Scattered remote lacunar infarcts involving the bilateral basal ganglia/corona radiata as well as the left thalamus. 3. 3 mm pituitary lesion, indeterminate. Finding could be further assessed with nonemergent pituitary mass protocol MRI. MRA HEAD IMPRESSION: 1. Negative MRA for large or proximal arterial branch occlusion. 2. Severe  proximal left SCA stenosis with attenuated  flow distally. No other high-grade or correctable stenosis identified. 3. Additional moderate multifocal atheromatous irregularity involving the anterior and posterior circulation as above. Electronically Signed   By: Jeannine Boga M.D.   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 1. Does the need for close, 24 hr/day medical supervision in concert with the patient's rehab needs make it unreasonable for this patient to be served in a less intensive setting? Yes 2. Co-Morbidities requiring supervision/potential complications: Asthma, diabetes, chronic kidney disease, history of CHF chronic diastolic, left ulnar neuropathy 3. Due to bladder management, bowel management, safety, skin/wound care, disease management, medication administration, pain management and patient education, does the patient require 24 hr/day rehab nursing? Yes 4. Does the patient require coordinated care of a physician, rehab nurse, PT (1-2 hrs/day, 5 days/week) and OT (1-2 hrs/day, 5 days/week) to address physical and functional deficits in the context of the above medical diagnosis(es)? Yes Addressing deficits in the following areas: balance, endurance, locomotion, strength, transferring, bowel/bladder control, bathing, dressing, feeding, grooming, toileting, cognition, speech, language, swallowing and psychosocial support 5. Can the patient actively participate in an intensive therapy program of at least 3 hrs of therapy per day at least 5 days per week? Yes 6. The potential for patient to make measurable gains while on inpatient rehab is excellent 7. Anticipated functional outcomes upon discharge from inpatient rehab are modified independent and supervision  with PT, modified independent and supervision with OT, n/a with SLP. 8. Estimated rehab length of stay to reach the above  functional goals is: 9-12d 9. Anticipated D/C setting: Home 10. Anticipated post D/C treatments: Outpatient therapy 11. Overall Rehab/Functional Prognosis: excellent  RECOMMENDATIONS: This patient's condition is appropriate for continued rehabilitative care in the following setting: CIR Patient has agreed to participate in recommended program. Yes Note that insurance prior authorization may be required for reimbursement for recommended care.  Comment:   Charlett Blake M.D. [facility] FAAPM&R (Sports Med, Neuromuscular Med) Diplomate Am Board of Electrodiagnostic Med  Cathlyn Parsons., PA-C 11/03/2016

## 2016-11-03 NOTE — ED Notes (Signed)
Patient transported to MRI 

## 2016-11-04 ENCOUNTER — Inpatient Hospital Stay (HOSPITAL_COMMUNITY)
Admission: RE | Admit: 2016-11-04 | Discharge: 2016-11-11 | DRG: 092 | Disposition: A | Discharge status: Home / Self Care | Payer: Medicaid Other | Source: Intra-hospital | Attending: Physical Medicine & Rehabilitation | Admitting: Physical Medicine & Rehabilitation

## 2016-11-04 ENCOUNTER — Inpatient Hospital Stay (HOSPITAL_COMMUNITY): Payer: Medicaid Other

## 2016-11-04 ENCOUNTER — Encounter (HOSPITAL_COMMUNITY): Payer: Self-pay | Admitting: *Deleted

## 2016-11-04 DIAGNOSIS — H43392 Other vitreous opacities, left eye: Secondary | ICD-10-CM | POA: Diagnosis not present

## 2016-11-04 DIAGNOSIS — D72829 Elevated white blood cell count, unspecified: Secondary | ICD-10-CM | POA: Diagnosis not present

## 2016-11-04 DIAGNOSIS — E785 Hyperlipidemia, unspecified: Secondary | ICD-10-CM

## 2016-11-04 DIAGNOSIS — B1920 Unspecified viral hepatitis C without hepatic coma: Secondary | ICD-10-CM | POA: Diagnosis present

## 2016-11-04 DIAGNOSIS — J3489 Other specified disorders of nose and nasal sinuses: Secondary | ICD-10-CM | POA: Diagnosis present

## 2016-11-04 DIAGNOSIS — I69393 Ataxia following cerebral infarction: Secondary | ICD-10-CM

## 2016-11-04 DIAGNOSIS — D62 Acute posthemorrhagic anemia: Secondary | ICD-10-CM

## 2016-11-04 DIAGNOSIS — G8929 Other chronic pain: Secondary | ICD-10-CM | POA: Diagnosis present

## 2016-11-04 DIAGNOSIS — I639 Cerebral infarction, unspecified: Secondary | ICD-10-CM | POA: Diagnosis present

## 2016-11-04 DIAGNOSIS — R270 Ataxia, unspecified: Secondary | ICD-10-CM

## 2016-11-04 DIAGNOSIS — N183 Chronic kidney disease, stage 3 (moderate): Secondary | ICD-10-CM | POA: Diagnosis present

## 2016-11-04 DIAGNOSIS — K5900 Constipation, unspecified: Secondary | ICD-10-CM | POA: Diagnosis present

## 2016-11-04 DIAGNOSIS — Z794 Long term (current) use of insulin: Secondary | ICD-10-CM

## 2016-11-04 DIAGNOSIS — E1142 Type 2 diabetes mellitus with diabetic polyneuropathy: Secondary | ICD-10-CM | POA: Diagnosis present

## 2016-11-04 DIAGNOSIS — E1169 Type 2 diabetes mellitus with other specified complication: Secondary | ICD-10-CM

## 2016-11-04 DIAGNOSIS — N179 Acute kidney failure, unspecified: Secondary | ICD-10-CM

## 2016-11-04 DIAGNOSIS — E113592 Type 2 diabetes mellitus with proliferative diabetic retinopathy without macular edema, left eye: Secondary | ICD-10-CM

## 2016-11-04 DIAGNOSIS — I130 Hypertensive heart and chronic kidney disease with heart failure and stage 1 through stage 4 chronic kidney disease, or unspecified chronic kidney disease: Secondary | ICD-10-CM | POA: Diagnosis present

## 2016-11-04 DIAGNOSIS — J45909 Unspecified asthma, uncomplicated: Secondary | ICD-10-CM | POA: Diagnosis present

## 2016-11-04 DIAGNOSIS — I10 Essential (primary) hypertension: Secondary | ICD-10-CM

## 2016-11-04 DIAGNOSIS — Z716 Tobacco abuse counseling: Secondary | ICD-10-CM

## 2016-11-04 DIAGNOSIS — I5032 Chronic diastolic (congestive) heart failure: Secondary | ICD-10-CM | POA: Diagnosis present

## 2016-11-04 DIAGNOSIS — B182 Chronic viral hepatitis C: Secondary | ICD-10-CM | POA: Diagnosis not present

## 2016-11-04 DIAGNOSIS — M549 Dorsalgia, unspecified: Secondary | ICD-10-CM | POA: Diagnosis present

## 2016-11-04 DIAGNOSIS — R2689 Other abnormalities of gait and mobility: Principal | ICD-10-CM | POA: Diagnosis present

## 2016-11-04 DIAGNOSIS — R7309 Other abnormal glucose: Secondary | ICD-10-CM

## 2016-11-04 DIAGNOSIS — F17200 Nicotine dependence, unspecified, uncomplicated: Secondary | ICD-10-CM | POA: Diagnosis present

## 2016-11-04 DIAGNOSIS — E1122 Type 2 diabetes mellitus with diabetic chronic kidney disease: Secondary | ICD-10-CM | POA: Diagnosis present

## 2016-11-04 DIAGNOSIS — E876 Hypokalemia: Secondary | ICD-10-CM | POA: Diagnosis present

## 2016-11-04 DIAGNOSIS — Z7982 Long term (current) use of aspirin: Secondary | ICD-10-CM

## 2016-11-04 DIAGNOSIS — E11319 Type 2 diabetes mellitus with unspecified diabetic retinopathy without macular edema: Secondary | ICD-10-CM | POA: Diagnosis present

## 2016-11-04 DIAGNOSIS — Z888 Allergy status to other drugs, medicaments and biological substances status: Secondary | ICD-10-CM

## 2016-11-04 DIAGNOSIS — Z79899 Other long term (current) drug therapy: Secondary | ICD-10-CM

## 2016-11-04 DIAGNOSIS — I6389 Other cerebral infarction: Secondary | ICD-10-CM

## 2016-11-04 DIAGNOSIS — Z8249 Family history of ischemic heart disease and other diseases of the circulatory system: Secondary | ICD-10-CM

## 2016-11-04 DIAGNOSIS — Z91013 Allergy to seafood: Secondary | ICD-10-CM | POA: Diagnosis not present

## 2016-11-04 DIAGNOSIS — I638 Other cerebral infarction: Secondary | ICD-10-CM | POA: Diagnosis not present

## 2016-11-04 LAB — CBC
HCT: 34.4 % — ABNORMAL LOW (ref 39.0–52.0)
Hemoglobin: 11.3 g/dL — ABNORMAL LOW (ref 13.0–17.0)
MCH: 29.6 pg (ref 26.0–34.0)
MCHC: 32.8 g/dL (ref 30.0–36.0)
MCV: 90.1 fL (ref 78.0–100.0)
Platelets: 192 10*3/uL (ref 150–400)
RBC: 3.82 MIL/uL — ABNORMAL LOW (ref 4.22–5.81)
RDW: 14.1 % (ref 11.5–15.5)
WBC: 11.5 10*3/uL — ABNORMAL HIGH (ref 4.0–10.5)

## 2016-11-04 LAB — COMPREHENSIVE METABOLIC PANEL
ALT: 26 U/L (ref 17–63)
AST: 23 U/L (ref 15–41)
Albumin: 3.2 g/dL — ABNORMAL LOW (ref 3.5–5.0)
Alkaline Phosphatase: 98 U/L (ref 38–126)
Anion gap: 8 (ref 5–15)
BUN: 21 mg/dL — ABNORMAL HIGH (ref 6–20)
CO2: 25 mmol/L (ref 22–32)
Calcium: 8.7 mg/dL — ABNORMAL LOW (ref 8.9–10.3)
Chloride: 105 mmol/L (ref 101–111)
Creatinine, Ser: 1.91 mg/dL — ABNORMAL HIGH (ref 0.61–1.24)
GFR calc Af Amer: 42 mL/min — ABNORMAL LOW (ref >60)
GFR calc non Af Amer: 36 mL/min — ABNORMAL LOW (ref >60)
Glucose, Bld: 93 mg/dL (ref 65–99)
Potassium: 4.2 mmol/L (ref 3.5–5.1)
Sodium: 138 mmol/L (ref 135–145)
Total Bilirubin: 0.5 mg/dL (ref 0.3–1.2)
Total Protein: 6.7 g/dL (ref 6.5–8.1)

## 2016-11-04 LAB — GLUCOSE, CAPILLARY
GLUCOSE-CAPILLARY: 73 mg/dL (ref 65–99)
GLUCOSE-CAPILLARY: 137 mg/dL — AB (ref 65–99)
Glucose-Capillary: 149 mg/dL — ABNORMAL HIGH (ref 65–99)
Glucose-Capillary: 174 mg/dL — ABNORMAL HIGH (ref 65–99)

## 2016-11-04 LAB — HEMOGLOBIN A1C
HEMOGLOBIN A1C: 8.6 % — AB (ref 4.8–5.6)
Mean Plasma Glucose: 200 mg/dL

## 2016-11-04 MED ORDER — ACETAMINOPHEN 325 MG PO TABS
650.0000 mg | ORAL_TABLET | ORAL | Status: DC | PRN
  Administered 2016-11-05 – 2016-11-08 (×3): 650 mg via ORAL
  Filled 2016-11-04 (×3): qty 2

## 2016-11-04 MED ORDER — DOXAZOSIN MESYLATE 2 MG PO TABS
2.0000 mg | ORAL_TABLET | Freq: Two times a day (BID) | ORAL | Status: DC
  Administered 2016-11-05 – 2016-11-08 (×7): 2 mg via ORAL
  Filled 2016-11-04 (×8): qty 1

## 2016-11-04 MED ORDER — CLONIDINE HCL 0.3 MG PO TABS: 0.3000 mg | ORAL_TABLET | Freq: Three times a day (TID) | ORAL | 1 refills | Status: DC

## 2016-11-04 MED ORDER — SACCHAROMYCES BOULARDII 250 MG PO CAPS
250.0000 mg | ORAL_CAPSULE | Freq: Two times a day (BID) | ORAL | Status: DC
  Administered 2016-11-04 – 2016-11-11 (×13): 250 mg via ORAL
  Filled 2016-11-04 (×14): qty 1

## 2016-11-04 MED ORDER — SORBITOL 70 % SOLN
30.0000 mL | Freq: Every day | Status: DC | PRN
  Administered 2016-11-05: 30 mL via ORAL
  Filled 2016-11-04: qty 30

## 2016-11-04 MED ORDER — GLIPIZIDE 10 MG PO TABS
10.0000 mg | ORAL_TABLET | Freq: Every day | ORAL | Status: DC
  Administered 2016-11-05 – 2016-11-11 (×7): 10 mg via ORAL
  Filled 2016-11-04 (×7): qty 1

## 2016-11-04 MED ORDER — CLOPIDOGREL BISULFATE 75 MG PO TABS: 75.0000 mg | ORAL_TABLET | Freq: Every day | ORAL | 1 refills | Status: DC

## 2016-11-04 MED ORDER — ELBASVIR-GRAZOPREVIR 50-100 MG PO TABS: 1.0000 | ORAL_TABLET | Freq: Every day | ORAL | Status: DC

## 2016-11-04 MED ORDER — ONDANSETRON HCL 4 MG/2ML IJ SOLN: 4.0000 mg | Freq: Four times a day (QID) | INTRAMUSCULAR | Status: DC | PRN

## 2016-11-04 MED ORDER — POLYVINYL ALCOHOL 1.4 % OP SOLN
1.0000 [drp] | OPHTHALMIC | Status: DC | PRN
  Filled 2016-11-04: qty 15

## 2016-11-04 MED ORDER — LOSARTAN POTASSIUM 50 MG PO TABS
50.0000 mg | ORAL_TABLET | Freq: Every day | ORAL | Status: DC
  Administered 2016-11-05 – 2016-11-07 (×3): 50 mg via ORAL
  Filled 2016-11-04 (×3): qty 1

## 2016-11-04 MED ORDER — POLYETHYLENE GLYCOL 3350 17 G PO PACK
17.0000 g | PACK | Freq: Every day | ORAL | Status: DC
  Administered 2016-11-05 – 2016-11-07 (×3): 17 g via ORAL
  Filled 2016-11-04 (×7): qty 1

## 2016-11-04 MED ORDER — INSULIN GLARGINE 100 UNIT/ML ~~LOC~~ SOLN
10.0000 [IU] | Freq: Every day | SUBCUTANEOUS | Status: DC
  Administered 2016-11-04 – 2016-11-10 (×7): 10 [IU] via SUBCUTANEOUS
  Filled 2016-11-04 (×7): qty 0.1

## 2016-11-04 MED ORDER — ACETAMINOPHEN 160 MG/5ML PO SOLN: 650.0000 mg | ORAL | Status: DC | PRN

## 2016-11-04 MED ORDER — ASPIRIN 325 MG PO TBEC: 325.0000 mg | DELAYED_RELEASE_TABLET | Freq: Every day | ORAL | 2 refills | Status: DC

## 2016-11-04 MED ORDER — INSULIN ASPART 100 UNIT/ML ~~LOC~~ SOLN
0.0000 [IU] | Freq: Three times a day (TID) | SUBCUTANEOUS | Status: DC
  Administered 2016-11-04: 3 [IU] via SUBCUTANEOUS
  Administered 2016-11-05: 5 [IU] via SUBCUTANEOUS
  Administered 2016-11-05 – 2016-11-07 (×5): 3 [IU] via SUBCUTANEOUS
  Administered 2016-11-07: 2 [IU] via SUBCUTANEOUS
  Administered 2016-11-08: 5 [IU] via SUBCUTANEOUS
  Administered 2016-11-09 (×3): 2 [IU] via SUBCUTANEOUS
  Administered 2016-11-10: 3 [IU] via SUBCUTANEOUS
  Administered 2016-11-10: 5 [IU] via SUBCUTANEOUS
  Administered 2016-11-11: 3 [IU] via SUBCUTANEOUS

## 2016-11-04 MED ORDER — SENNA 8.6 MG PO TABS
1.0000 | ORAL_TABLET | Freq: Every day | ORAL | Status: DC
  Administered 2016-11-05 – 2016-11-10 (×5): 8.6 mg via ORAL
  Filled 2016-11-04 (×7): qty 1

## 2016-11-04 MED ORDER — VITAMIN D 1000 UNITS PO TABS
2000.0000 [IU] | ORAL_TABLET | Freq: Every day | ORAL | Status: DC
  Administered 2016-11-04: 2000 [IU] via ORAL
  Filled 2016-11-04: qty 2

## 2016-11-04 MED ORDER — ACETAMINOPHEN 650 MG RE SUPP
650.0000 mg | RECTAL | Status: DC | PRN
  Filled 2016-11-04: qty 1

## 2016-11-04 MED ORDER — OXYCODONE HCL 5 MG PO TABS
5.0000 mg | ORAL_TABLET | ORAL | Status: DC | PRN
  Administered 2016-11-04 – 2016-11-10 (×6): 10 mg via ORAL
  Administered 2016-11-10: 5 mg via ORAL
  Administered 2016-11-11 (×2): 10 mg via ORAL
  Filled 2016-11-04 (×9): qty 2

## 2016-11-04 MED ORDER — HYDRALAZINE HCL 50 MG PO TABS
100.0000 mg | ORAL_TABLET | Freq: Three times a day (TID) | ORAL | Status: DC
  Administered 2016-11-04 – 2016-11-11 (×20): 100 mg via ORAL
  Filled 2016-11-04 (×20): qty 2

## 2016-11-04 MED ORDER — ATORVASTATIN CALCIUM 20 MG PO TABS
20.0000 mg | ORAL_TABLET | Freq: Every day | ORAL | Status: DC
  Administered 2016-11-04 – 2016-11-10 (×7): 20 mg via ORAL
  Filled 2016-11-04 (×7): qty 1

## 2016-11-04 MED ORDER — CLONIDINE HCL 0.3 MG PO TABS
0.3000 mg | ORAL_TABLET | Freq: Three times a day (TID) | ORAL | Status: DC
  Administered 2016-11-05 – 2016-11-11 (×19): 0.3 mg via ORAL
  Filled 2016-11-04 (×20): qty 1

## 2016-11-04 MED ORDER — INSULIN ASPART 100 UNIT/ML ~~LOC~~ SOLN: 3.0000 [IU] | Freq: Three times a day (TID) | SUBCUTANEOUS | 11 refills | Status: DC

## 2016-11-04 MED ORDER — ASPIRIN EC 325 MG PO TBEC
325.0000 mg | DELAYED_RELEASE_TABLET | Freq: Every day | ORAL | Status: DC
  Administered 2016-11-04: 325 mg via ORAL
  Filled 2016-11-04: qty 1

## 2016-11-04 MED ORDER — POLYVINYL ALCOHOL 1.4 % OP SOLN
1.0000 [drp] | OPHTHALMIC | Status: DC | PRN
  Administered 2016-11-04 (×2): 1 [drp] via OPHTHALMIC
  Filled 2016-11-04 (×2): qty 15

## 2016-11-04 MED ORDER — ONDANSETRON HCL 4 MG PO TABS: 4.0000 mg | ORAL_TABLET | Freq: Four times a day (QID) | ORAL | Status: DC | PRN

## 2016-11-04 MED ORDER — FUROSEMIDE 40 MG PO TABS
40.0000 mg | ORAL_TABLET | Freq: Every day | ORAL | Status: DC
  Administered 2016-11-05 – 2016-11-11 (×7): 40 mg via ORAL
  Filled 2016-11-04 (×7): qty 1

## 2016-11-04 MED ORDER — ASPIRIN EC 325 MG PO TBEC
325.0000 mg | DELAYED_RELEASE_TABLET | Freq: Every day | ORAL | Status: DC
  Administered 2016-11-05 – 2016-11-11 (×7): 325 mg via ORAL
  Filled 2016-11-04 (×7): qty 1

## 2016-11-04 MED ORDER — INSULIN ASPART 100 UNIT/ML ~~LOC~~ SOLN
3.0000 [IU] | Freq: Three times a day (TID) | SUBCUTANEOUS | Status: DC
  Administered 2016-11-04 – 2016-11-11 (×20): 3 [IU] via SUBCUTANEOUS

## 2016-11-04 MED ORDER — HYDROCERIN EX CREA
1.0000 "application " | TOPICAL_CREAM | Freq: Every day | CUTANEOUS | Status: DC
  Administered 2016-11-05 – 2016-11-11 (×7): 1 via TOPICAL
  Filled 2016-11-04 (×2): qty 113

## 2016-11-04 MED ORDER — ATORVASTATIN CALCIUM 20 MG PO TABS: 20.0000 mg | ORAL_TABLET | Freq: Every day | ORAL | 1 refills | Status: DC

## 2016-11-04 MED ORDER — CLOPIDOGREL BISULFATE 75 MG PO TABS
75.0000 mg | ORAL_TABLET | Freq: Every day | ORAL | Status: DC
  Administered 2016-11-05 – 2016-11-11 (×7): 75 mg via ORAL
  Filled 2016-11-04 (×7): qty 1

## 2016-11-04 MED ORDER — INSULIN GLARGINE 100 UNIT/ML ~~LOC~~ SOLN: 10.0000 [IU] | Freq: Every day | SUBCUTANEOUS | 11 refills | Status: DC

## 2016-11-04 MED ORDER — SACCHAROMYCES BOULARDII 250 MG PO CAPS: 250.0000 mg | ORAL_CAPSULE | Freq: Two times a day (BID) | ORAL | 0 refills | Status: DC

## 2016-11-04 MED ORDER — POTASSIUM CHLORIDE CRYS ER 20 MEQ PO TBCR
40.0000 meq | EXTENDED_RELEASE_TABLET | Freq: Once | ORAL | Status: AC
  Administered 2016-11-04: 40 meq via ORAL
  Filled 2016-11-04: qty 2

## 2016-11-04 MED ORDER — HEPARIN SODIUM (PORCINE) 5000 UNIT/ML IJ SOLN: 5000.0000 [IU] | Freq: Three times a day (TID) | INTRAMUSCULAR | Status: DC

## 2016-11-04 MED ORDER — WHITE PETROLATUM GEL
Status: AC
  Filled 2016-11-04: qty 1

## 2016-11-04 MED ORDER — HEPARIN SODIUM (PORCINE) 5000 UNIT/ML IJ SOLN
5000.0000 [IU] | Freq: Three times a day (TID) | INTRAMUSCULAR | Status: DC
  Administered 2016-11-04 – 2016-11-11 (×19): 5000 [IU] via SUBCUTANEOUS
  Filled 2016-11-04 (×18): qty 1

## 2016-11-04 NOTE — Progress Notes (Signed)
Jacobson, John Salk, MD Physician Signed Physical Medicine and Rehabilitation  Consult Note Date of Service: 11/03/2016 12:21 PM  Related encounter: ED to Hosp-Admission (Current) from 11/02/2016 in [place] All Collapse All   [] Hide copied text [] Hover for attribution information      Physical Medicine and Rehabilitation Consult Reason for Consult: Gait disorder with dizziness Referring Physician: [facility]   HPI: John Jacobson is a 61 y.o. right handed male with history of asthma, diabetes mellitus, hypertension, right bundle branch block, tobacco abuse, CKD stage III, chronic diastolic congestive heart failure. 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 One level home. Brother recently had CABG. Presented 11/02/2016 with dizziness as well as gait disturbance and left-sided numbness over the past 4 days as well as reported fall. Patient also reported that he had stepped on a nail with his right foot. CT/MRI showed patchy multifocal acute ischemic infarcts involving the superior left cerebellar hemisphere and left midbrain/pons. No associated hemorrhage or mass effect. Scattered remote lacunar infarcts involving the bilateral basal ganglia corona radiata as well as left thalamus. 3 mm pituitary lesion indeterminate. MRA negative for large vessel occlusion. Patient did not receive TPA. Echocardiogram pending. Neurology consulted presently on Plavix for CVA prophylaxis. Tolerating a regular diet. Subcutaneous heparin added for DVT prophylaxis. Physical therapy evaluation completed 11/03/2016 with recommendations of physical medicine rehabilitation consult.  Two-month history of numbness in the fourth and fifth digits of left hand, leans on the left elbow habitually while using computer  Review of Systems  Constitutional: Negative for chills and fever.    HENT: Negative for hearing loss.   Eyes: Negative for blurred vision and double vision.  Respiratory: Negative for cough and shortness of breath.   Cardiovascular: Negative for chest pain, palpitations and leg swelling.  Gastrointestinal: Positive for constipation. Negative for nausea.  Genitourinary: Negative for dysuria, flank pain and hematuria.  Musculoskeletal: Positive for back pain and myalgias.  Skin: Negative for rash.  Neurological: Positive for dizziness and focal weakness. Negative for seizures.  All other systems reviewed and are negative.      Past Medical History:  Diagnosis Date  . Asthma   . DM (diabetes mellitus) ([place])   . HTN (hypertension)   . RBBB (right bundle branch block)         Past Surgical History:  Procedure Laterality Date  . BACK SURGERY    . HAND SURGERY          Family History  Problem Relation Age of Onset  . Coronary artery disease Mother        MI in her 79s  . Hypertension Unknown   . Diabetes Unknown   . Alzheimer's disease Unknown    Social History:  reports that he has been smoking.  He has never used smokeless tobacco. He reports that he drinks alcohol. He reports that he does not use drugs. Allergies:       Allergies  Allergen Reactions  . Amlodipine Besy-Benazepril Hcl Shortness Of Breath and Swelling    Mouth and tongue swelling  . Shellfish Allergy Anaphylaxis         Medications Prior to Admission  Medication Sig Dispense Refill  . aspirin EC 81 MG EC tablet Take 1 tablet (81 mg total) by mouth daily. 30 tablet 1  . Carboxymethylcellulose Sod PF 0.25 % SOLN Apply 1 drop to  eye 4 (four) times daily.    . cloNIDine (CATAPRES) 0.3 MG tablet Take 1 tablet (0.3 mg total) by mouth 3 (three) times daily. 90 tablet 1  . doxazosin (CARDURA) 4 MG tablet Take 2 mg by mouth 2 (two) times daily.    . Elbasvir-Grazoprevir 50-100 MG TABS Take 1 tablet by mouth daily.    . furosemide (LASIX) 40 MG tablet Take  40 mg by mouth daily.    Marland Kitchen glipiZIDE (GLUCOTROL) 10 MG tablet Take 10 mg by mouth daily before breakfast.    . glucose 4 GM chewable tablet Chew 1 tablet by mouth daily as needed for low blood sugar.    . hydrALAZINE (APRESOLINE) 100 MG tablet Take 1 tablet (100 mg total) by mouth 3 (three) times daily. 90 tablet 1  . insulin glargine (LANTUS) 100 UNIT/ML injection Inject 16-19 Units into the skin at bedtime. Sliding scale.     . losartan (COZAAR) 50 MG tablet Take 50 mg by mouth daily.    Marland Kitchen oxyCODONE (OXY IR/ROXICODONE) 5 MG immediate release tablet Take 1-2 tablets (5-10 mg total) by mouth every 4 (four) hours as needed for moderate pain. 15 tablet 0  . polyethylene glycol (MIRALAX / GLYCOLAX) packet Take 17 g by mouth daily. (Patient taking differently: Take 17 g by mouth daily as needed for mild constipation. ) 14 each 0  . senna (SENOKOT) 8.6 MG TABS tablet Take 1 tablet (8.6 mg total) by mouth daily. (Patient taking differently: Take 1 tablet by mouth daily as needed for mild constipation. ) 120 each 0  . Skin Protectants, Misc. (EUCERIN) cream Apply 1 application topically daily. On feet      Home: Home Living Family/patient expects to be discharged to:: Private residence Living Arrangements: Other relatives (brother) Available Help at Discharge: Family, Available PRN/intermittently (brother just had cabg) Type of Home: House Home Access: Level entry Home Layout: One level Home Equipment: None  Functional History: Prior Function Level of Independence: Independent Functional Status:  Mobility: Bed Mobility Overal bed mobility: Needs Assistance Bed Mobility: Supine to Sit Supine to sit: Supervision, HOB elevated General bed mobility comments: Incr time and use of rail  Transfers Overall transfer level: Needs assistance Equipment used: None Transfers: Sit to/from Stand Sit to Stand: Min assist General transfer comment: Assist for  balance Ambulation/Gait Ambulation/Gait assistance: Mod assist Ambulation Distance (Feet): 90 Feet Assistive device: 1 person hand held assist Gait Pattern/deviations: Step-through pattern, Decreased stride length, Ataxic, Drifts right/left General Gait Details: Assist for balance and support Gait velocity: decr Gait velocity interpretation: Below normal speed for age/gender  ADL:  Cognition: Cognition Overall Cognitive Status: Within Functional Limits for tasks assessed Orientation Level: Oriented X4 Cognition Arousal/Alertness: Awake/alert Behavior During Therapy: WFL for tasks assessed/performed Overall Cognitive Status: Within Functional Limits for tasks assessed  Blood pressure (!) 175/70, pulse 65, temperature 98.1 F (36.7 C), temperature source Oral, resp. rate 16, height 5\' 7"  (1.702 m), weight 74 kg (163 lb 3.2 oz), SpO2 99 %. Physical Exam  Vitals reviewed. Constitutional: He is oriented to person, place, and time. He appears well-developed.  HENT:  Head: Normocephalic.  Eyes: EOM are normal.  Neck: Normal range of motion. Neck supple. No thyromegaly present.  Cardiovascular: Normal rate and regular rhythm.   Respiratory: Effort normal and breath sounds normal. No respiratory distress.  GI: Soft. Bowel sounds are normal. He exhibits no distension.  Neurological: He is alert and oriented to person, place, and time.  Follows simple commands. Incoordination left  upper extremity  Skin: Skin is warm and dry.  , Ataxia, left finger-nose-finger left. Heel to shin. No ataxia on the right side. Motor strength is 5/5 bilateral deltoid, bicep, tricep. 5/5 right. Grip 4/5 left grip 4/5, left intrinsics 5/5, right intrinsics. Left hand intrinsic atrophy. Tinel's at the elbow on the left side. 5/5 strength bilateral hip flexor, knee extensor, ankle dorsiflexor, plantar flexor Sensation intact to light touch bilateral upper and lower extremities, no facial numbness  Lab  Results Last 24 Hours       Results for orders placed or performed during the hospital encounter of 11/02/16 (from the past 24 hour(s))  Comprehensive metabolic panel     Status: Abnormal   Collection Time: 11/02/16  6:41 PM  Result Value Ref Range   Sodium 134 (L) 135 - 145 mmol/L   Potassium 3.6 3.5 - 5.1 mmol/L   Chloride 102 101 - 111 mmol/L   CO2 23 22 - 32 mmol/L   Glucose, Bld 197 (H) 65 - 99 mg/dL   BUN 18 6 - 20 mg/dL   Creatinine, Ser 1.78 (H) 0.61 - 1.24 mg/dL   Calcium 8.6 (L) 8.9 - 10.3 mg/dL   Total Protein 6.9 6.5 - 8.1 g/dL   Albumin 3.3 (L) 3.5 - 5.0 g/dL   AST 29 15 - 41 U/L   ALT 30 17 - 63 U/L   Alkaline Phosphatase 99 38 - 126 U/L   Total Bilirubin 0.7 0.3 - 1.2 mg/dL   GFR calc non Af Amer 39 (L) >60 mL/min   GFR calc Af Amer 45 (L) >60 mL/min   Anion gap 9 5 - 15  CBC     Status: Abnormal   Collection Time: 11/02/16  6:41 PM  Result Value Ref Range   WBC 14.3 (H) 4.0 - 10.5 K/uL   RBC 4.07 (L) 4.22 - 5.81 MIL/uL   Hemoglobin 11.8 (L) 13.0 - 17.0 g/dL   HCT 35.9 (L) 39.0 - 52.0 %   MCV 88.2 78.0 - 100.0 fL   MCH 29.0 26.0 - 34.0 pg   MCHC 32.9 30.0 - 36.0 g/dL   RDW 13.6 11.5 - 15.5 %   Platelets 179 150 - 400 K/uL  Urinalysis, Routine w reflex microscopic     Status: Abnormal   Collection Time: 11/02/16  7:09 PM  Result Value Ref Range   Color, Urine YELLOW YELLOW   APPearance CLEAR CLEAR   Specific Gravity, Urine 1.017 1.005 - 1.030   pH 5.0 5.0 - 8.0   Glucose, UA >=500 (A) NEGATIVE mg/dL   Hgb urine dipstick SMALL (A) NEGATIVE   Bilirubin Urine NEGATIVE NEGATIVE   Ketones, ur NEGATIVE NEGATIVE mg/dL   Protein, ur >=300 (A) NEGATIVE mg/dL   Nitrite NEGATIVE NEGATIVE   Leukocytes, UA NEGATIVE NEGATIVE   RBC / HPF 6-30 0 - 5 RBC/hpf   WBC, UA 0-5 0 - 5 WBC/hpf   Bacteria, UA RARE (A) NONE SEEN   Squamous Epithelial / LPF 0-5 (A) NONE SEEN   Mucous PRESENT    Hyaline Casts, UA PRESENT    C-reactive protein     Status: Abnormal   Collection Time: 11/02/16  8:18 PM  Result Value Ref Range   CRP 1.2 (H) <1.0 mg/dL  Sedimentation rate     Status: Abnormal   Collection Time: 11/02/16  8:18 PM  Result Value Ref Range   Sed Rate 64 (H) 0 - 16 mm/hr  I-stat troponin, ED  Status: None   Collection Time: 11/02/16  8:31 PM  Result Value Ref Range   Troponin i, poc 0.03 0.00 - 0.08 ng/mL   Comment 3          CBG monitoring, ED     Status: Abnormal   Collection Time: 11/03/16  1:17 AM  Result Value Ref Range   Glucose-Capillary 237 (H) 65 - 99 mg/dL  Lipid panel     Status: Abnormal   Collection Time: 11/03/16  6:04 AM  Result Value Ref Range   Cholesterol 149 0 - 200 mg/dL   Triglycerides 91 <150 mg/dL   HDL 37 (L) >40 mg/dL   Total CHOL/HDL Ratio 4.0 RATIO   VLDL 18 0 - 40 mg/dL   LDL Cholesterol 94 0 - 99 mg/dL  CBC with Differential/Platelet     Status: Abnormal   Collection Time: 11/03/16  6:29 AM  Result Value Ref Range   WBC 10.8 (H) 4.0 - 10.5 K/uL   RBC 3.55 (L) 4.22 - 5.81 MIL/uL   Hemoglobin 10.5 (L) 13.0 - 17.0 g/dL   HCT 31.6 (L) 39.0 - 52.0 %   MCV 89.0 78.0 - 100.0 fL   MCH 29.6 26.0 - 34.0 pg   MCHC 33.2 30.0 - 36.0 g/dL   RDW 13.9 11.5 - 15.5 %   Platelets 159 150 - 400 K/uL   Neutrophils Relative % 56 %   Lymphocytes Relative 32 %   Monocytes Relative 10 %   Eosinophils Relative 2 %   Basophils Relative 0 %   Neutro Abs 6.0 1.7 - 7.7 K/uL   Lymphs Abs 3.5 0.7 - 4.0 K/uL   Monocytes Absolute 1.1 (H) 0.1 - 1.0 K/uL   Eosinophils Absolute 0.2 0.0 - 0.7 K/uL   Basophils Absolute 0.0 0.0 - 0.1 K/uL   WBC Morphology ATYPICAL LYMPHOCYTES   Basic metabolic panel     Status: Abnormal   Collection Time: 11/03/16  6:29 AM  Result Value Ref Range   Sodium 136 135 - 145 mmol/L   Potassium 3.3 (L) 3.5 - 5.1 mmol/L   Chloride 104 101 - 111 mmol/L   CO2 26 22 - 32 mmol/L   Glucose, Bld 200 (H) 65 - 99  mg/dL   BUN 19 6 - 20 mg/dL   Creatinine, Ser 1.83 (H) 0.61 - 1.24 mg/dL   Calcium 8.3 (L) 8.9 - 10.3 mg/dL   GFR calc non Af Amer 38 (L) >60 mL/min   GFR calc Af Amer 44 (L) >60 mL/min   Anion gap 6 5 - 15  Glucose, capillary     Status: Abnormal   Collection Time: 11/03/16  8:05 AM  Result Value Ref Range   Glucose-Capillary 154 (H) 65 - 99 mg/dL  Glucose, capillary     Status: Abnormal   Collection Time: 11/03/16 11:36 AM  Result Value Ref Range   Glucose-Capillary 113 (H) 65 - 99 mg/dL      Imaging Results (Last 48 hours)  Dg Chest 2 View  Result Date: 11/02/2016 CLINICAL DATA:  61 y/o  M; weakness. EXAM: CHEST  2 VIEW COMPARISON:  03/28/2016 chest radiograph FINDINGS: Stable heart size and mediastinal contours are within normal limits given projection and technique. Both lungs are clear. Mild thoracic spine dextrocurvature. IMPRESSION: No active cardiopulmonary disease. Electronically Signed   By: Kristine Garbe M.D.   On: 11/02/2016 20:52   Ct Head Wo Contrast  Result Date: 11/02/2016 CLINICAL DATA:  Generalized weakness.  Difficulty walking  EXAM: CT HEAD WITHOUT CONTRAST TECHNIQUE: Contiguous axial images were obtained from the base of the skull through the vertex without intravenous contrast. COMPARISON:  None. FINDINGS: Brain: No acute intracranial hemorrhage. No focal mass lesion. No midline shift or mass effect. No hydrocephalus. Basilar cisterns are patent. There is hypodense region within the LEFT cerebellum adjacent the cerebral peduncle (image 11, series 3). This lesion measures approximately 1.7 x 1.4 cm. Vascular: No hyperdense vessel or unexpected calcification. Skull: Normal. Negative for fracture or focal lesion. Sinuses/Orbits: No acute finding. Other: None. IMPRESSION: Concern for LEFT cerebellar infarction.  Recommend brain MRI. These results will be called to the ordering clinician or representative by the Radiologist Assistant, and  communication documented in the PACS or zVision Dashboard. Electronically Signed   By: Suzy Bouchard M.D.   On: 11/02/2016 21:51   Mr Brain Wo Contrast  Result Date: 11/03/2016 CLINICAL DATA:  Initial evaluation for acute dizziness. EXAM: MRI HEAD WITHOUT CONTRAST MRA HEAD WITHOUT CONTRAST TECHNIQUE: Multiplanar, multiecho pulse sequences of the brain and surrounding structures were obtained without intravenous contrast. Angiographic images of the head were obtained using MRA technique without contrast. COMPARISON:  Prior CT from 11/02/2016. FINDINGS: MRI HEAD FINDINGS Brain: Cerebral volume within normal limits. Few scatter remote lacunar infarcts noted within the left basal ganglia/ corona radiata. Additional remote lacunar infarct present within the left thalamus. Patchy restricted diffusion within the superior left cerebellar hemisphere, consistent with acute ischemic infarct. Additional patchy infarct within the left aspect of the midbrain/ pons (series 5, image 20). No associated hemorrhage or mass effect. Gray-white matter differentiation otherwise maintained. No other evidence for acute or subacute ischemia. Few punctate chronic micro hemorrhages noted within the left centrum semi ovale. These may be hypertensive in nature. No mass lesion, midline shift or mass effect. Ventricles normal size without evidence for hydrocephalus. No extra-axial fluid collection. Major dural sinuses are grossly patent. Pituitary gland of normal size. Tiny 3 mm T1 hyperintense lesion within the central aspect of the pituitary gland noted, indeterminate. Midline structures intact and normal. Vascular: Major intracranial vascular flow voids are maintained. Skull and upper cervical spine: Craniocervical junction normal. Visualized upper cervical spine unremarkable. Bone marrow signal intensity within normal limits. No scalp soft tissue abnormality. Sinuses/Orbits: Globes and orbital soft tissues within normal limits.  Paranasal sinuses are clear. No mastoid effusion. Inner ear structures normal. Other: None. MRA HEAD FINDINGS ANTERIOR CIRCULATION: Distal cervical segments of the internal carotid arteries are patent with antegrade flow. Petrous segments patent bilaterally without stenosis. Multifocal atheromatous irregularity present throughout the carotid siphons with moderate multifocal irregular narrowing. ICA termini patent. Left A1 segment dominant. Right A1 segment hypoplastic and/ or absent. Atheromatous irregularity present throughout the anterior cerebral arteries without high-grade flow-limiting stenosis. ACA is are patent to their distal aspects. M1 segments patent without high-grade stenosis or occlusion. No proximal M2 occlusion. Distal small vessel atheromatous irregularity throughout the MCA branches bilaterally. POSTERIOR CIRCULATION: Vertebral arteries code dominant and widely patent to the vertebrobasilar junction. Right PICA patent proximally. Left PICA not visualized. Basilar artery widely patent. Right SCA irregular but patent to its distal aspect. Severe stenosis present within the proximal left SCA (series 455). Flow distally is severely attenuated. PCAs arise from the basilar artery. Mild to moderate multifocal atheromatous irregularity involving the PCAs bilaterally without high-grade flow-limiting stenosis. PCAs are patent to their distal aspects. No aneurysm or vascular malformation. IMPRESSION: MRI HEAD IMPRESSION: 1. Patchy multifocal acute ischemic infarcts involving the superior left cerebellar hemisphere and left  midbrain/pons. No associated hemorrhage or mass effect. 2. Scattered remote lacunar infarcts involving the bilateral basal ganglia/corona radiata as well as the left thalamus. 3. 3 mm pituitary lesion, indeterminate. Finding could be further assessed with nonemergent pituitary mass protocol MRI. MRA HEAD IMPRESSION: 1. Negative MRA for large or proximal arterial branch occlusion. 2. Severe  proximal left SCA stenosis with attenuated flow distally. No other high-grade or correctable stenosis identified. 3. Additional moderate multifocal atheromatous irregularity involving the anterior and posterior circulation as above. Electronically Signed   By: Jeannine Boga M.D.   On: 11/03/2016 01:50   Dg Foot Complete Right  Result Date: 11/02/2016 CLINICAL DATA:  37 y/o  M; stepped on a roofing nail. EXAM: RIGHT FOOT COMPLETE - 3+ VIEW COMPARISON:  None. FINDINGS: No acute fracture or dislocation. Second digit amputation absent phalanges. Lisfranc alignment is maintained. No articular abnormality is evident. IMPRESSION: No acute bony or articular abnormality identified. Electronically Signed   By: Kristine Garbe M.D.   On: 11/02/2016 20:54   Mr Jodene Nam Head/brain BZ Cm  Result Date: 11/03/2016 CLINICAL DATA:  Initial evaluation for acute dizziness. EXAM: MRI HEAD WITHOUT CONTRAST MRA HEAD WITHOUT CONTRAST TECHNIQUE: Multiplanar, multiecho pulse sequences of the brain and surrounding structures were obtained without intravenous contrast. Angiographic images of the head were obtained using MRA technique without contrast. COMPARISON:  Prior CT from 11/02/2016. FINDINGS: MRI HEAD FINDINGS Brain: Cerebral volume within normal limits. Few scatter remote lacunar infarcts noted within the left basal ganglia/ corona radiata. Additional remote lacunar infarct present within the left thalamus. Patchy restricted diffusion within the superior left cerebellar hemisphere, consistent with acute ischemic infarct. Additional patchy infarct within the left aspect of the midbrain/ pons (series 5, image 20). No associated hemorrhage or mass effect. Gray-white matter differentiation otherwise maintained. No other evidence for acute or subacute ischemia. Few punctate chronic micro hemorrhages noted within the left centrum semi ovale. These may be hypertensive in nature. No mass lesion, midline shift or mass  effect. Ventricles normal size without evidence for hydrocephalus. No extra-axial fluid collection. Major dural sinuses are grossly patent. Pituitary gland of normal size. Tiny 3 mm T1 hyperintense lesion within the central aspect of the pituitary gland noted, indeterminate. Midline structures intact and normal. Vascular: Major intracranial vascular flow voids are maintained. Skull and upper cervical spine: Craniocervical junction normal. Visualized upper cervical spine unremarkable. Bone marrow signal intensity within normal limits. No scalp soft tissue abnormality. Sinuses/Orbits: Globes and orbital soft tissues within normal limits. Paranasal sinuses are clear. No mastoid effusion. Inner ear structures normal. Other: None. MRA HEAD FINDINGS ANTERIOR CIRCULATION: Distal cervical segments of the internal carotid arteries are patent with antegrade flow. Petrous segments patent bilaterally without stenosis. Multifocal atheromatous irregularity present throughout the carotid siphons with moderate multifocal irregular narrowing. ICA termini patent. Left A1 segment dominant. Right A1 segment hypoplastic and/ or absent. Atheromatous irregularity present throughout the anterior cerebral arteries without high-grade flow-limiting stenosis. ACA is are patent to their distal aspects. M1 segments patent without high-grade stenosis or occlusion. No proximal M2 occlusion. Distal small vessel atheromatous irregularity throughout the MCA branches bilaterally. POSTERIOR CIRCULATION: Vertebral arteries code dominant and widely patent to the vertebrobasilar junction. Right PICA patent proximally. Left PICA not visualized. Basilar artery widely patent. Right SCA irregular but patent to its distal aspect. Severe stenosis present within the proximal left SCA (series 455). Flow distally is severely attenuated. PCAs arise from the basilar artery. Mild to moderate multifocal atheromatous irregularity involving the PCAs bilaterally without  high-grade flow-limiting stenosis. PCAs are patent to their distal aspects. No aneurysm or vascular malformation. IMPRESSION: MRI HEAD IMPRESSION: 1. Patchy multifocal acute ischemic infarcts involving the superior left cerebellar hemisphere and left midbrain/pons. No associated hemorrhage or mass effect. 2. Scattered remote lacunar infarcts involving the bilateral basal ganglia/corona radiata as well as the left thalamus. 3. 3 mm pituitary lesion, indeterminate. Finding could be further assessed with nonemergent pituitary mass protocol MRI. MRA HEAD IMPRESSION: 1. Negative MRA for large or proximal arterial branch occlusion. 2. Severe proximal left SCA stenosis with attenuated flow distally. No other high-grade or correctable stenosis identified. 3. Additional moderate multifocal atheromatous irregularity involving the anterior and posterior circulation as above. Electronically Signed   By: Jeannine Boga M.D.   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 1. Does the need for close, 24 hr/day medical supervision in concert with the patient's rehab needs make it unreasonable for this patient to be served in a less intensive setting? Yes 2. Co-Morbidities requiring supervision/potential complications: Asthma, diabetes, chronic kidney disease, history of CHF chronic diastolic, left ulnar neuropathy 3. Due to bladder management, bowel management, safety, skin/wound care, disease management, medication administration, pain management and patient education, does the patient require 24 hr/day rehab nursing? Yes 4. Does the patient require coordinated care of a physician, rehab nurse, PT (1-2 hrs/day, 5 days/week) and OT (1-2 hrs/day, 5 days/week) to address physical and functional deficits in the context of the above medical diagnosis(es)? Yes Addressing deficits in the following areas: balance,  endurance, locomotion, strength, transferring, bowel/bladder control, bathing, dressing, feeding, grooming, toileting, cognition, speech, language, swallowing and psychosocial support 5. Can the patient actively participate in an intensive therapy program of at least 3 hrs of therapy per day at least 5 days per week? Yes 6. The potential for patient to make measurable gains while on inpatient rehab is excellent 7. Anticipated functional outcomes upon discharge from inpatient rehab are modified independent and supervision  with PT, modified independent and supervision with OT, n/a with SLP. 8. Estimated rehab length of stay to reach the above functional goals is: 9-12d 9. Anticipated D/C setting: Home 10. Anticipated post D/C treatments: Outpatient therapy 11. Overall Rehab/Functional Prognosis: excellent  RECOMMENDATIONS: This patient's condition is appropriate for continued rehabilitative care in the following setting: CIR Patient has agreed to participate in recommended program. Yes Note that insurance prior authorization may be required for reimbursement for recommended care.  Comment:   Charlett Blake M.D. [facility] FAAPM&[id number] (Sports Med, Neuromuscular Med) Diplomate [employer] Board of Electrodiagnostic Med  Cathlyn Parsons., PA-C 11/03/2016    Revision History                        Routing History

## 2016-11-04 NOTE — Progress Notes (Signed)
**  Preliminary report by tech**  Carotid artery duplex complete. Findings are consistent with a 1-39 percent stenosis involving the right internal carotid artery and the left internal carotid artery. The vertebral arteries demonstrate antegrade flow.  11/04/16 1:28 PM Carlos Levering RVT

## 2016-11-04 NOTE — H&P (Signed)
Physical Medicine and Rehabilitation Admission H&P       Chief Complaint  Patient presents with  . Weakness  : HPI: John A Smithis a 61 y.o.right handed malewith history of asthma, diabetes mellitus, hypertension, right bundle branch block, tobacco abuse, CKD stage III, chronic diastolic congestive heart failure. 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 One level home. Brother recently had CABG. Presented 11/02/2016 with dizziness as well as gait disturbance and left-sided numbness over the past 4 days as well as reported fall. Patient also reported that he had stepped on a nail with his right foot. CT/MRI showed patchy multifocal acute ischemic infarcts involving the superior left cerebellar hemisphere and left midbrain/pons. No associated hemorrhage or mass effect. Scattered remote lacunar infarcts involving the bilateral basal ganglia corona radiata as well as left thalamus. 3 mm pituitary lesion indeterminate. MRA negative for large vessel occlusion. Patient did not receive TPA. Echocardiogram with ejection fraction 65% and grade 1 diastolic dysfunction.Carotid Dopplers with no ICA stenosis. Neurology consulted presently on Plavix for CVA prophylaxis. Tolerating a regular diet. Subcutaneous heparin added for DVT prophylaxis. Physical and occupational therapy evaluation completed 11/03/2016 with recommendations of physical medicine rehabilitation consult. Patient was admitted for a comprehensive rehabilitation program  Review of Systems  Constitutional: Negative for chills and fever.  HENT: Negative for hearing loss.   Eyes: Negative for blurred vision and double vision.  Respiratory: Negative for cough and shortness of breath.   Cardiovascular: Negative for chest pain, palpitations and leg swelling.  Gastrointestinal: Positive for constipation. Negative for nausea and vomiting.    Genitourinary: Negative for dysuria, hematuria and urgency.  Musculoskeletal: Positive for back pain and myalgias.  Skin: Negative for rash.  Neurological: Positive for dizziness and weakness. Negative for seizures.  All other systems reviewed and are negative.      Past Medical History:  Diagnosis Date  . Asthma   . DM (diabetes mellitus) ([place])   . HTN (hypertension)   . RBBB (right bundle branch block)         Past Surgical History:  Procedure Laterality Date  . BACK SURGERY    . HAND SURGERY          Family History  Problem Relation Age of Onset  . Coronary artery disease Mother        MI in her 13s  . Hypertension Unknown   . Diabetes Unknown   . Alzheimer's disease Unknown    Social History:  reports that he has been smoking.  He has never used smokeless tobacco. He reports that he drinks alcohol. He reports that he does not use drugs. Allergies:       Allergies  Allergen Reactions  . Amlodipine Besy-Benazepril Hcl Shortness Of Breath and Swelling    Mouth and tongue swelling  . Shellfish Allergy Anaphylaxis         Medications Prior to Admission  Medication Sig Dispense Refill  . aspirin EC 81 MG EC tablet Take 1 tablet (81 mg total) by mouth daily. 30 tablet 1  . Carboxymethylcellulose Sod PF 0.25 % SOLN Apply 1 drop to eye 4 (four) times daily.    . cloNIDine (CATAPRES) 0.3 MG tablet Take 1 tablet (0.3 mg total) by mouth 3 (three) times daily. 90 tablet 1  . doxazosin (CARDURA) 4 MG tablet Take 2 mg by mouth 2 (two) times daily.    . Elbasvir-Grazoprevir 50-100 MG TABS Take 1  tablet by mouth daily.    . furosemide (LASIX) 40 MG tablet Take 40 mg by mouth daily.    Marland Kitchen glipiZIDE (GLUCOTROL) 10 MG tablet Take 10 mg by mouth daily before breakfast.    . glucose 4 GM chewable tablet Chew 1 tablet by mouth daily as needed for low blood sugar.    . hydrALAZINE (APRESOLINE) 100 MG tablet Take 1 tablet (100 mg total) by mouth 3 (three)  times daily. 90 tablet 1  . insulin glargine (LANTUS) 100 UNIT/ML injection Inject 16-19 Units into the skin at bedtime. Sliding scale.     . losartan (COZAAR) 50 MG tablet Take 50 mg by mouth daily.    Marland Kitchen oxyCODONE (OXY IR/ROXICODONE) 5 MG immediate release tablet Take 1-2 tablets (5-10 mg total) by mouth every 4 (four) hours as needed for moderate pain. 15 tablet 0  . polyethylene glycol (MIRALAX / GLYCOLAX) packet Take 17 g by mouth daily. (Patient taking differently: Take 17 g by mouth daily as needed for mild constipation. ) 14 each 0  . senna (SENOKOT) 8.6 MG TABS tablet Take 1 tablet (8.6 mg total) by mouth daily. (Patient taking differently: Take 1 tablet by mouth daily as needed for mild constipation. ) 120 each 0  . Skin Protectants, Misc. (EUCERIN) cream Apply 1 application topically daily. On feet      Home: Home Living Family/patient expects to be discharged to:: Private residence Living Arrangements: Other relatives (brother) Available Help at Discharge: Family, Available PRN/intermittently Type of Home: House Home Access: Level entry Home Layout: One level Bathroom Shower/Tub: Chiropodist: Standard Home Equipment: None   Functional History: Prior Function Level of Independence: Independent  Functional Status:  Mobility: Bed Mobility Overal bed mobility: Needs Assistance Bed Mobility: Supine to Sit Supine to sit: Supervision, HOB elevated General bed mobility comments: pt in chair Transfers Overall transfer level: Needs assistance Equipment used: Rolling walker (2 wheeled) Transfers: Sit to/from Stand Sit to Stand: Min guard General transfer comment: slow, cues for hand placement, min guard for safety Ambulation/Gait Ambulation/Gait assistance: Mod assist Ambulation Distance (Feet): 90 Feet Assistive device: 1 person hand held assist Gait Pattern/deviations: Step-through pattern, Decreased stride length, Ataxic, Drifts  right/left General Gait Details: Assist for balance and support Gait velocity: decr Gait velocity interpretation: Below normal speed for age/gender  ADL: ADL Overall ADL's : Needs assistance/impaired Eating/Feeding: Independent, Sitting Grooming: Wash/dry hands, Standing, Min guard Upper Body Bathing: Set up, Sitting Lower Body Bathing: Minimal assistance, Sit to/from stand Lower Body Bathing Details (indicate cue type and reason): able to wash feet in sitting with set up Upper Body Dressing : Set up, Sitting Lower Body Dressing: Minimal assistance, Sit to/from stand Lower Body Dressing Details (indicate cue type and reason): able to don and doff socks Toilet Transfer: Minimal assistance, RW, Ambulation Toileting- Clothing Manipulation and Hygiene: Minimal assistance, Sit to/from stand Functional mobility during ADLs: Minimal assistance, Rolling walker  Cognition: Cognition Overall Cognitive Status: Within Functional Limits for tasks assessed Arousal/Alertness: Awake/alert Orientation Level: Oriented X4 Attention: Selective Selective Attention: Appears intact Safety/Judgment: Appears intact Cognition Arousal/Alertness: Awake/alert Behavior During Therapy: WFL for tasks assessed/performed Overall Cognitive Status: Within Functional Limits for tasks assessed  Physical Exam: Blood pressure (!) 150/78, pulse (!) 51, temperature 98.2 F (36.8 C), temperature source Oral, resp. rate 20, height 5' 7" (1.702 m), weight 80.1 kg (176 lb 9.6 oz), SpO2 100 %. Physical Exam  Vitals reviewed. Constitutional: He appears well-developed.  HENT:  Head: Normocephalic and atraumatic.  Eyes: EOM are normal. Pupils are equal, round, and reactive to light. Right eye exhibits no discharge. Left eye exhibits no discharge.  Neck: Normal range of motion. Neck supple. No JVD present. No tracheal deviation present. No thyromegaly present.  Cardiovascular: Normal rate and regular rhythm.  Exam  reveals no friction rub.   No murmur heard. Respiratory: Breath sounds normal. No respiratory distress. He has no wheezes. He has no rales.  GI: Soft. Bowel sounds are normal. He exhibits no distension.  Psychiatric: He has a normal mood and affect. His behavior is normal.  Skin. Warm and dry Neurological: He is alertand oriented to person, place, and time.  Follows simple commands. Reasonable insight and awarenes Skin: Skin is warmand dry.  Ataxia left arm and leg with finger to nose and heel to shin. . Motor 5/5 proximal to distal RUE and RLE.  4/5 proximal to distal LUE and LLE with left hand intrinsic atrophy. Tinel's at the elbow on the left side. Sensation intact to light touch bilateral upper and lower extremities. CN grossly intact   Lab Results Last 48 Hours  Results for orders placed or performed during the hospital encounter of 11/02/16 (from the past 48 hour(s))  Comprehensive metabolic panel     Status: Abnormal   Collection Time: 11/02/16  6:41 PM  Result Value Ref Range   Sodium 134 (L) 135 - 145 mmol/L   Potassium 3.6 3.5 - 5.1 mmol/L   Chloride 102 101 - 111 mmol/L   CO2 23 22 - 32 mmol/L   Glucose, Bld 197 (H) 65 - 99 mg/dL   BUN 18 6 - 20 mg/dL   Creatinine, Ser 1.78 (H) 0.61 - 1.24 mg/dL   Calcium 8.6 (L) 8.9 - 10.3 mg/dL   Total Protein 6.9 6.5 - 8.1 g/dL   Albumin 3.3 (L) 3.5 - 5.0 g/dL   AST 29 15 - 41 U/L   ALT 30 17 - 63 U/L   Alkaline Phosphatase 99 38 - 126 U/L   Total Bilirubin 0.7 0.3 - 1.2 mg/dL   GFR calc non Af Amer 39 (L) >60 mL/min   GFR calc Af Amer 45 (L) >60 mL/min    Comment: (NOTE) The eGFR has been calculated using the CKD EPI equation. This calculation has not been validated in all clinical situations. eGFR's persistently <60 mL/min signify possible Chronic Kidney Disease.    Anion gap 9 5 - 15  CBC     Status: Abnormal   Collection Time: 11/02/16  6:41 PM  Result Value Ref Range   WBC 14.3 (H) 4.0 -  10.5 K/uL   RBC 4.07 (L) 4.22 - 5.81 MIL/uL   Hemoglobin 11.8 (L) 13.0 - 17.0 g/dL   HCT 35.9 (L) 39.0 - 52.0 %   MCV 88.2 78.0 - 100.0 fL   MCH 29.0 26.0 - 34.0 pg   MCHC 32.9 30.0 - 36.0 g/dL   RDW 13.6 11.5 - 15.5 %   Platelets 179 150 - 400 K/uL  Urinalysis, Routine w reflex microscopic     Status: Abnormal   Collection Time: 11/02/16  7:09 PM  Result Value Ref Range   Color, Urine YELLOW YELLOW   APPearance CLEAR CLEAR   Specific Gravity, Urine 1.017 1.005 - 1.030   pH 5.0 5.0 - 8.0   Glucose, UA >=500 (A) NEGATIVE mg/dL   Hgb urine dipstick SMALL (A) NEGATIVE   Bilirubin Urine NEGATIVE NEGATIVE   Ketones, ur NEGATIVE NEGATIVE mg/dL   Protein, ur >=  300 (A) NEGATIVE mg/dL   Nitrite NEGATIVE NEGATIVE   Leukocytes, UA NEGATIVE NEGATIVE   RBC / HPF 6-30 0 - 5 RBC/hpf   WBC, UA 0-5 0 - 5 WBC/hpf   Bacteria, UA RARE (A) NONE SEEN   Squamous Epithelial / LPF 0-5 (A) NONE SEEN   Mucous PRESENT    Hyaline Casts, UA PRESENT   C-reactive protein     Status: Abnormal   Collection Time: 11/02/16  8:18 PM  Result Value Ref Range   CRP 1.2 (H) <1.0 mg/dL  Sedimentation rate     Status: Abnormal   Collection Time: 11/02/16  8:18 PM  Result Value Ref Range   Sed Rate 64 (H) 0 - 16 mm/hr  I-stat troponin, ED     Status: None   Collection Time: 11/02/16  8:31 PM  Result Value Ref Range   Troponin i, poc 0.03 0.00 - 0.08 ng/mL   Comment 3            Comment: Due to the release kinetics of cTnI, a negative result within the first hours of the onset of symptoms does not rule out myocardial infarction with certainty. If myocardial infarction is still suspected, repeat the test at appropriate intervals.   CBG monitoring, ED     Status: Abnormal   Collection Time: 11/03/16  1:17 AM  Result Value Ref Range   Glucose-Capillary 237 (H) 65 - 99 mg/dL  HIV antibody (Routine Testing)     Status: None   Collection Time: 11/03/16  6:04 AM  Result  Value Ref Range   HIV Screen 4th Generation wRfx Non Reactive Non Reactive    Comment: (NOTE) Performed At: [facility] [place], [place] [id number] Lindon Romp MD [id number]   Hemoglobin A1c     Status: Abnormal   Collection Time: 11/03/16  6:04 AM  Result Value Ref Range   Hgb A1c MFr Bld 8.6 (H) 4.8 - 5.6 %    Comment: (NOTE)         Pre-diabetes: 5.7 - 6.4         Diabetes: >6.4         Glycemic control for adults with diabetes: <7.0    Mean Plasma Glucose 200 mg/dL    Comment: (NOTE) Performed At: [facility] [place], [place] [id number] Lindon Romp MD [id number]   Lipid panel     Status: Abnormal   Collection Time: 11/03/16  6:04 AM  Result Value Ref Range   Cholesterol 149 0 - 200 mg/dL   Triglycerides 91 <150 mg/dL   HDL 37 (L) >40 mg/dL   Total CHOL/HDL Ratio 4.0 RATIO   VLDL 18 0 - 40 mg/dL   LDL Cholesterol 94 0 - 99 mg/dL    Comment:        Total Cholesterol/HDL:CHD Risk Coronary Heart Disease Risk Table                     Men   Women  1/2 Average Risk   3.4   3.3  Average Risk       5.0   4.4  2 X Average Risk   9.6   7.1  3 X Average Risk  23.4   11.0        Use the calculated Patient Ratio above and the CHD Risk Table to determine the patient's CHD Risk.        ATP III CLASSIFICATION (LDL):  <  100     mg/dL   Optimal  100-129  mg/dL   Near or Above                    Optimal  130-159  mg/dL   Borderline  160-189  mg/dL   High  >190     mg/dL   Very High   CBC with Differential/Platelet     Status: Abnormal   Collection Time: 11/03/16  6:29 AM  Result Value Ref Range   WBC 10.8 (H) 4.0 - 10.5 K/uL   RBC 3.55 (L) 4.22 - 5.81 MIL/uL   Hemoglobin 10.5 (L) 13.0 - 17.0 g/dL   HCT 31.6 (L) 39.0 - 52.0 %   MCV 89.0 78.0 - 100.0 fL   MCH 29.6 26.0 - 34.0 pg   MCHC 33.2 30.0 - 36.0 g/dL   RDW 13.9 11.5 - 15.5 %   Platelets 159 150 - 400 K/uL    Neutrophils Relative % 56 %   Lymphocytes Relative 32 %   Monocytes Relative 10 %   Eosinophils Relative 2 %   Basophils Relative 0 %   Neutro Abs 6.0 1.7 - 7.7 K/uL   Lymphs Abs 3.5 0.7 - 4.0 K/uL   Monocytes Absolute 1.1 (H) 0.1 - 1.0 K/uL   Eosinophils Absolute 0.2 0.0 - 0.7 K/uL   Basophils Absolute 0.0 0.0 - 0.1 K/uL   WBC Morphology ATYPICAL LYMPHOCYTES     Comment: RARE  Basic metabolic panel     Status: Abnormal   Collection Time: 11/03/16  6:29 AM  Result Value Ref Range   Sodium 136 135 - 145 mmol/L   Potassium 3.3 (L) 3.5 - 5.1 mmol/L   Chloride 104 101 - 111 mmol/L   CO2 26 22 - 32 mmol/L   Glucose, Bld 200 (H) 65 - 99 mg/dL   BUN 19 6 - 20 mg/dL   Creatinine, Ser 1.83 (H) 0.61 - 1.24 mg/dL   Calcium 8.3 (L) 8.9 - 10.3 mg/dL   GFR calc non Af Amer 38 (L) >60 mL/min   GFR calc Af Amer 44 (L) >60 mL/min    Comment: (NOTE) The eGFR has been calculated using the CKD EPI equation. This calculation has not been validated in all clinical situations. eGFR's persistently <60 mL/min signify possible Chronic Kidney Disease.    Anion gap 6 5 - 15  Glucose, capillary     Status: Abnormal   Collection Time: 11/03/16  8:05 AM  Result Value Ref Range   Glucose-Capillary 154 (H) 65 - 99 mg/dL  Glucose, capillary     Status: Abnormal   Collection Time: 11/03/16 11:36 AM  Result Value Ref Range   Glucose-Capillary 113 (H) 65 - 99 mg/dL  Glucose, capillary     Status: Abnormal   Collection Time: 11/03/16  5:00 PM  Result Value Ref Range   Glucose-Capillary 162 (H) 65 - 99 mg/dL  Glucose, capillary     Status: Abnormal   Collection Time: 11/03/16  9:12 PM  Result Value Ref Range   Glucose-Capillary 124 (H) 65 - 99 mg/dL   Comment 1 Notify RN       Imaging Results (Last 48 hours)  Dg Chest 2 View  Result Date: 11/02/2016 CLINICAL DATA:  61 y/o  M; weakness. EXAM: CHEST  2 VIEW COMPARISON:  03/28/2016 chest radiograph FINDINGS:  Stable heart size and mediastinal contours are within normal limits given projection and technique. Both lungs are clear. Mild thoracic spine  dextrocurvature. IMPRESSION: No active cardiopulmonary disease. Electronically Signed   By: Kristine Garbe M.D.   On: 11/02/2016 20:52   Ct Head Wo Contrast  Result Date: 11/02/2016 CLINICAL DATA:  Generalized weakness.  Difficulty walking EXAM: CT HEAD WITHOUT CONTRAST TECHNIQUE: Contiguous axial images were obtained from the base of the skull through the vertex without intravenous contrast. COMPARISON:  None. FINDINGS: Brain: No acute intracranial hemorrhage. No focal mass lesion. No midline shift or mass effect. No hydrocephalus. Basilar cisterns are patent. There is hypodense region within the LEFT cerebellum adjacent the cerebral peduncle (image 11, series 3). This lesion measures approximately 1.7 x 1.4 cm. Vascular: No hyperdense vessel or unexpected calcification. Skull: Normal. Negative for fracture or focal lesion. Sinuses/Orbits: No acute finding. Other: None. IMPRESSION: Concern for LEFT cerebellar infarction.  Recommend brain MRI. These results will be called to the ordering clinician or representative by the Radiologist Assistant, and communication documented in the PACS or zVision Dashboard. Electronically Signed   By: Suzy Bouchard M.D.   On: 11/02/2016 21:51   Mr Brain Wo Contrast  Result Date: 11/03/2016 CLINICAL DATA:  Initial evaluation for acute dizziness. EXAM: MRI HEAD WITHOUT CONTRAST MRA HEAD WITHOUT CONTRAST TECHNIQUE: Multiplanar, multiecho pulse sequences of the brain and surrounding structures were obtained without intravenous contrast. Angiographic images of the head were obtained using MRA technique without contrast. COMPARISON:  Prior CT from 11/02/2016. FINDINGS: MRI HEAD FINDINGS Brain: Cerebral volume within normal limits. Few scatter remote lacunar infarcts noted within the left basal ganglia/ corona radiata.  Additional remote lacunar infarct present within the left thalamus. Patchy restricted diffusion within the superior left cerebellar hemisphere, consistent with acute ischemic infarct. Additional patchy infarct within the left aspect of the midbrain/ pons (series 5, image 20). No associated hemorrhage or mass effect. Gray-white matter differentiation otherwise maintained. No other evidence for acute or subacute ischemia. Few punctate chronic micro hemorrhages noted within the left centrum semi ovale. These may be hypertensive in nature. No mass lesion, midline shift or mass effect. Ventricles normal size without evidence for hydrocephalus. No extra-axial fluid collection. Major dural sinuses are grossly patent. Pituitary gland of normal size. Tiny 3 mm T1 hyperintense lesion within the central aspect of the pituitary gland noted, indeterminate. Midline structures intact and normal. Vascular: Major intracranial vascular flow voids are maintained. Skull and upper cervical spine: Craniocervical junction normal. Visualized upper cervical spine unremarkable. Bone marrow signal intensity within normal limits. No scalp soft tissue abnormality. Sinuses/Orbits: Globes and orbital soft tissues within normal limits. Paranasal sinuses are clear. No mastoid effusion. Inner ear structures normal. Other: None. MRA HEAD FINDINGS ANTERIOR CIRCULATION: Distal cervical segments of the internal carotid arteries are patent with antegrade flow. Petrous segments patent bilaterally without stenosis. Multifocal atheromatous irregularity present throughout the carotid siphons with moderate multifocal irregular narrowing. ICA termini patent. Left A1 segment dominant. Right A1 segment hypoplastic and/ or absent. Atheromatous irregularity present throughout the anterior cerebral arteries without high-grade flow-limiting stenosis. ACA is are patent to their distal aspects. M1 segments patent without high-grade stenosis or occlusion. No proximal  M2 occlusion. Distal small vessel atheromatous irregularity throughout the MCA branches bilaterally. POSTERIOR CIRCULATION: Vertebral arteries code dominant and widely patent to the vertebrobasilar junction. Right PICA patent proximally. Left PICA not visualized. Basilar artery widely patent. Right SCA irregular but patent to its distal aspect. Severe stenosis present within the proximal left SCA (series 455). Flow distally is severely attenuated. PCAs arise from the basilar artery. Mild to moderate multifocal atheromatous irregularity  involving the PCAs bilaterally without high-grade flow-limiting stenosis. PCAs are patent to their distal aspects. No aneurysm or vascular malformation. IMPRESSION: MRI HEAD IMPRESSION: 1. Patchy multifocal acute ischemic infarcts involving the superior left cerebellar hemisphere and left midbrain/pons. No associated hemorrhage or mass effect. 2. Scattered remote lacunar infarcts involving the bilateral basal ganglia/corona radiata as well as the left thalamus. 3. 3 mm pituitary lesion, indeterminate. Finding could be further assessed with nonemergent pituitary mass protocol MRI. MRA HEAD IMPRESSION: 1. Negative MRA for large or proximal arterial branch occlusion. 2. Severe proximal left SCA stenosis with attenuated flow distally. No other high-grade or correctable stenosis identified. 3. Additional moderate multifocal atheromatous irregularity involving the anterior and posterior circulation as above. Electronically Signed   By: Jeannine Boga M.D.   On: 11/03/2016 01:50   Dg Foot Complete Right  Result Date: 11/02/2016 CLINICAL DATA:  89 y/o  M; stepped on a roofing nail. EXAM: RIGHT FOOT COMPLETE - 3+ VIEW COMPARISON:  None. FINDINGS: No acute fracture or dislocation. Second digit amputation absent phalanges. Lisfranc alignment is maintained. No articular abnormality is evident. IMPRESSION: No acute bony or articular abnormality identified. Electronically Signed   By:  Kristine Garbe M.D.   On: 11/02/2016 20:54   Mr Jodene Nam Head/brain DB Cm  Result Date: 11/03/2016 CLINICAL DATA:  Initial evaluation for acute dizziness. EXAM: MRI HEAD WITHOUT CONTRAST MRA HEAD WITHOUT CONTRAST TECHNIQUE: Multiplanar, multiecho pulse sequences of the brain and surrounding structures were obtained without intravenous contrast. Angiographic images of the head were obtained using MRA technique without contrast. COMPARISON:  Prior CT from 11/02/2016. FINDINGS: MRI HEAD FINDINGS Brain: Cerebral volume within normal limits. Few scatter remote lacunar infarcts noted within the left basal ganglia/ corona radiata. Additional remote lacunar infarct present within the left thalamus. Patchy restricted diffusion within the superior left cerebellar hemisphere, consistent with acute ischemic infarct. Additional patchy infarct within the left aspect of the midbrain/ pons (series 5, image 20). No associated hemorrhage or mass effect. Gray-white matter differentiation otherwise maintained. No other evidence for acute or subacute ischemia. Few punctate chronic micro hemorrhages noted within the left centrum semi ovale. These may be hypertensive in nature. No mass lesion, midline shift or mass effect. Ventricles normal size without evidence for hydrocephalus. No extra-axial fluid collection. Major dural sinuses are grossly patent. Pituitary gland of normal size. Tiny 3 mm T1 hyperintense lesion within the central aspect of the pituitary gland noted, indeterminate. Midline structures intact and normal. Vascular: Major intracranial vascular flow voids are maintained. Skull and upper cervical spine: Craniocervical junction normal. Visualized upper cervical spine unremarkable. Bone marrow signal intensity within normal limits. No scalp soft tissue abnormality. Sinuses/Orbits: Globes and orbital soft tissues within normal limits. Paranasal sinuses are clear. No mastoid effusion. Inner ear structures normal.  Other: None. MRA HEAD FINDINGS ANTERIOR CIRCULATION: Distal cervical segments of the internal carotid arteries are patent with antegrade flow. Petrous segments patent bilaterally without stenosis. Multifocal atheromatous irregularity present throughout the carotid siphons with moderate multifocal irregular narrowing. ICA termini patent. Left A1 segment dominant. Right A1 segment hypoplastic and/ or absent. Atheromatous irregularity present throughout the anterior cerebral arteries without high-grade flow-limiting stenosis. ACA is are patent to their distal aspects. M1 segments patent without high-grade stenosis or occlusion. No proximal M2 occlusion. Distal small vessel atheromatous irregularity throughout the MCA branches bilaterally. POSTERIOR CIRCULATION: Vertebral arteries code dominant and widely patent to the vertebrobasilar junction. Right PICA patent proximally. Left PICA not visualized. Basilar artery widely patent. Right SCA irregular  but patent to its distal aspect. Severe stenosis present within the proximal left SCA (series 455). Flow distally is severely attenuated. PCAs arise from the basilar artery. Mild to moderate multifocal atheromatous irregularity involving the PCAs bilaterally without high-grade flow-limiting stenosis. PCAs are patent to their distal aspects. No aneurysm or vascular malformation. IMPRESSION: MRI HEAD IMPRESSION: 1. Patchy multifocal acute ischemic infarcts involving the superior left cerebellar hemisphere and left midbrain/pons. No associated hemorrhage or mass effect. 2. Scattered remote lacunar infarcts involving the bilateral basal ganglia/corona radiata as well as the left thalamus. 3. 3 mm pituitary lesion, indeterminate. Finding could be further assessed with nonemergent pituitary mass protocol MRI. MRA HEAD IMPRESSION: 1. Negative MRA for large or proximal arterial branch occlusion. 2. Severe proximal left SCA stenosis with attenuated flow distally. No other high-grade  or correctable stenosis identified. 3. Additional moderate multifocal atheromatous irregularity involving the anterior and posterior circulation as above. Electronically Signed   By: Jeannine Boga M.D.   On: 11/03/2016 01:50        Medical Problem List and Plan: 1.  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  DVT Prophylaxis/Anticoagulation: Subcutaneous heparin. Monitor platelet counts and any signs of bleeding 3. Pain Management/chronic back pain: Oxycodone as needed 4. Mood: Provide emotional support 5. Neuropsych: This patient is capable of making decisions on his own behalf. 6. Skin/Wound Care: Routine skin checks 7. Fluids/Electrolytes/Nutrition: Routine I&O with follow-up chemistries 8. Diabetes mellitus and peripheral neuropathy. Hemoglobin A1c 8.6. Lantus insulin 10 units daily at bedtime. Glucotrol 10 mg daily Check CBGs before meals and at bedtime. Diabetic teaching 9. Hypertension with history of right bundle branch block. Clonidine 0.3 mg 3 times a day, Cardura 2 mg twice a day, Lasix 40 mg daily, hydralazine 100 mg 3 times a day, Cozaar 50 mg daily. Monitor with increased mobility 10. Chronic diastolic congestive heart failure. Continue Lasix. Monitor for any signs of fluid overload 11. CKD stage III. Follow-up chemistries serially 12. Hypokalemia. Follow-up chemistries 13. Tobacco abuse. Counseling 14.. Constipation. Laxative assistance  Post Admission Physician Evaluation: 1. Functional deficits secondary  to left cerebellar, midbrain and pontine infarcts. 2. Patient is admitted to receive collaborative, interdisciplinary care between the physiatrist, rehab nursing staff, and therapy team. 3. Patient's level of medical complexity and substantial therapy needs in context of that medical necessity cannot be provided at a lesser intensity of care such as a SNF. 4. Patient  has experienced substantial functional loss from his/her baseline which was documented above under the "Functional History" and "Functional Status" headings.  Judging by the patient's diagnosis, physical exam, and functional history, the patient has potential for functional progress which will result in measurable gains while on inpatient rehab.  These gains will be of substantial and practical use upon discharge  in facilitating mobility and self-care at the household level. 5. Physiatrist will provide 24 hour management of medical needs as well as oversight of the therapy plan/treatment and provide guidance as appropriate regarding the interaction of the two. 6. The Preadmission Screening has been reviewed and patient status is unchanged unless otherwise stated above. 7. 24 hour rehab nursing will assist with bladder management, bowel management, safety, skin/wound care, disease management, medication administration, pain management and patient education  and help integrate therapy concepts, techniques,education, etc. 8. PT will assess and treat for/with: Lower extremity strength, range of motion, stamina, balance,  functional mobility, safety, adaptive techniques and equipment, NMR, family and patient education, community re-entry, vestibular rx   Goals are: mod I. 9. OT will assess and treat for/with: ADL's, functional mobility, safety, upper extremity strength, adaptive techniques and equipment, NMR, family and patient education.   Goals are: mod I. Therapy may proceed with showering this patient. 10. SLP will assess and treat for/with: n/a.  Goals are: n/a. 11. Case Management and Social Worker will assess and treat for psychological issues and discharge planning. 12. Team conference will be held weekly to assess progress toward goals and to determine barriers to discharge. 13. Patient will receive at least 3 hours of therapy per day at least 5 days per week. 14. ELOS: 8-11 days       15. Prognosis:   excellent     Meredith Staggers, MD, [facility] 11/04/2016  Cathlyn Parsons., PA-C 11/04/2016

## 2016-11-04 NOTE — Progress Notes (Signed)
PT Cancellation Note  Patient Details Name: John Jacobson MRN: [id number] DOB: July 23, 1955   Cancelled Treatment:    Reason Eval/Treat Not Completed: Patient at procedure or test/unavailable.  Pt gone to vascular labs on arrival.  Will see later as able. 11/04/2016  John Jacobson, [place] [phone]  (pager)   John Jacobson 11/04/2016, 1:25 PM

## 2016-11-04 NOTE — Discharge Summary (Addendum)
Physician Discharge Summary  John Jacobson MRN: [id number] DOB/AGE: [birth year] 61 y.o.  PCP: Patient, No Pcp Per   Admit date: 11/02/2016 Discharge date: 11/04/2016  Discharge Diagnoses:    Principal Problem:   Ischemic stroke [facility]) Active Problems:   Hypertension   DM (diabetes mellitus), type 2 with renal complications (HCC)   Hypertensive urgency   CKD (chronic kidney disease), stage III   Normocytic anemia   Loss of coordination   Puncture wound of right foot   Chronic diastolic CHF (congestive heart failure) (HCC)   Chronic left-sided low back pain   Gait disturbance, post-stroke    Follow-up recommendations Follow-up with PCP in 3-5 days , including all  additional recommended appointments as below Follow-up CBC, CMP weekly Patient to continue with dual antiplatelet therapy  Patient to follow-up in [facility] in 4-6 weeks      Current Discharge Medication List    START taking these medications   Details  atorvastatin (LIPITOR) 20 MG tablet Take 1 tablet (20 mg total) by mouth daily at 6 PM. Qty: 30 tablet, Refills: 1    clopidogrel (PLAVIX) 75 MG tablet Take 1 tablet (75 mg total) by mouth daily. Qty: 30 tablet, Refills: 1    insulin aspart (NOVOLOG) 100 UNIT/ML injection Inject 3 Units into the skin 3 (three) times daily with meals. Qty: 10 mL, Refills: 11      CONTINUE these medications which have CHANGED   Details  aspirin EC 325 MG EC tablet Take 1 tablet (325 mg total) by mouth daily. Qty: 30 tablet, Refills: 2    cloNIDine (CATAPRES) 0.3 MG tablet Take 1 tablet (0.3 mg total) by mouth 3 (three) times daily. Qty: 90 tablet, Refills: 1    insulin glargine (LANTUS) 100 UNIT/ML injection Inject 0.1 mLs (10 Units total) into the skin at bedtime. Qty: 10 mL, Refills: 11    saccharomyces boulardii (FLORASTOR) 250 MG capsule Take 1 capsule (250 mg total) by mouth 2 (two) times daily. Qty: 60 capsule, Refills: 0      CONTINUE  these medications which have NOT CHANGED   Details  Carboxymethylcellulose Sod PF 0.25 % SOLN Apply 1 drop to eye 4 (four) times daily.    cholecalciferol (VITAMIN D) 1000 units tablet Take 2,000 Units by mouth daily.    doxazosin (CARDURA) 4 MG tablet Take 2 mg by mouth 2 (two) times daily.    Elbasvir-Grazoprevir 50-100 MG TABS Take 1 tablet by mouth daily.    furosemide (LASIX) 40 MG tablet Take 40 mg by mouth daily.    glipiZIDE (GLUCOTROL) 10 MG tablet Take 10 mg by mouth daily before breakfast.    glucose 4 GM chewable tablet Chew 1 tablet by mouth daily as needed for low blood sugar.    hydrALAZINE (APRESOLINE) 100 MG tablet Take 1 tablet (100 mg total) by mouth 3 (three) times daily. Qty: 90 tablet, Refills: 1    losartan (COZAAR) 50 MG tablet Take 50 mg by mouth daily.    oxyCODONE (OXY IR/ROXICODONE) 5 MG immediate release tablet Take 1-2 tablets (5-10 mg total) by mouth every 4 (four) hours as needed for moderate pain. Qty: 15 tablet, Refills: 0    polyethylene glycol (MIRALAX / GLYCOLAX) packet Take 17 g by mouth daily. Qty: 14 each, Refills: 0    senna (SENOKOT) 8.6 MG TABS tablet Take 1 tablet (8.6 mg total) by mouth daily. Qty: 120 each, Refills: 0    Skin Protectants, Misc. (EUCERIN) cream Apply 1  application topically daily. On feet         Discharge Condition: *Stable  Discharge Instructions Get Medicines reviewed and adjusted: Please take all your medications with you for your next visit with your Primary MD  Please request your Primary MD to go over all hospital tests and procedure/radiological results at the follow up, please ask your Primary MD to get all Hospital records sent to his/her office.  If you experience worsening of your admission symptoms, develop shortness of breath, life threatening emergency, suicidal or homicidal thoughts you must seek medical attention immediately by calling 911 or calling your MD immediately if symptoms less  severe.  You must read complete instructions/literature along with all the possible adverse reactions/side effects for all the Medicines you take and that have been prescribed to you. Take any new Medicines after you have completely understood and accpet all the possible adverse reactions/side effects.   Do not drive when taking Pain medications.   Do not take more than prescribed Pain, Sleep and Anxiety Medications  Special Instructions: If you have smoked or chewed Tobacco in the last 2 yrs please stop smoking, stop any regular Alcohol and or any Recreational drug use.  Wear Seat belts while driving.  Please note  You were cared for by a hospitalist during your hospital stay. Once you are discharged, your primary care physician will handle any further medical issues. Please note that NO REFILLS for any discharge medications will be authorized once you are discharged, as it is imperative that you return to your primary care physician (or establish a relationship with a primary care physician if you do not have one) for your aftercare needs so that they can reassess your need for medications and monitor your lab values.  Discharge Instructions    Diet - low sodium heart healthy    Complete by:  As directed    Increase activity slowly    Complete by:  As directed        Allergies  Allergen Reactions  . Amlodipine Besy-Benazepril Hcl Shortness Of Breath and Swelling    Mouth and tongue swelling  . Shellfish Allergy Anaphylaxis      Disposition: CIR   Consults:  Neurology    Significant Diagnostic Studies:  Dg Chest 2 View  Result Date: 11/02/2016 CLINICAL DATA:  61 y/o  M; weakness. EXAM: CHEST  2 VIEW COMPARISON:  03/28/2016 chest radiograph FINDINGS: Stable heart size and mediastinal contours are within normal limits given projection and technique. Both lungs are clear. Mild thoracic spine dextrocurvature. IMPRESSION: No active cardiopulmonary disease. Electronically  Signed   By: Kristine Garbe M.D.   On: 11/02/2016 20:52   Ct Head Wo Contrast  Result Date: 11/02/2016 CLINICAL DATA:  Generalized weakness.  Difficulty walking EXAM: CT HEAD WITHOUT CONTRAST TECHNIQUE: Contiguous axial images were obtained from the base of the skull through the vertex without intravenous contrast. COMPARISON:  None. FINDINGS: Brain: No acute intracranial hemorrhage. No focal mass lesion. No midline shift or mass effect. No hydrocephalus. Basilar cisterns are patent. There is hypodense region within the LEFT cerebellum adjacent the cerebral peduncle (image 11, series 3). This lesion measures approximately 1.7 x 1.4 cm. Vascular: No hyperdense vessel or unexpected calcification. Skull: Normal. Negative for fracture or focal lesion. Sinuses/Orbits: No acute finding. Other: None. IMPRESSION: Concern for LEFT cerebellar infarction.  Recommend brain MRI. These results will be called to the ordering clinician or representative by the Radiologist Assistant, and communication documented in the PACS or zVision  Dashboard. Electronically Signed   By: Suzy Bouchard M.D.   On: 11/02/2016 21:51   Mr Brain Wo Contrast  Result Date: 11/03/2016 CLINICAL DATA:  Initial evaluation for acute dizziness. EXAM: MRI HEAD WITHOUT CONTRAST MRA HEAD WITHOUT CONTRAST TECHNIQUE: Multiplanar, multiecho pulse sequences of the brain and surrounding structures were obtained without intravenous contrast. Angiographic images of the head were obtained using MRA technique without contrast. COMPARISON:  Prior CT from 11/02/2016. FINDINGS: MRI HEAD FINDINGS Brain: Cerebral volume within normal limits. Few scatter remote lacunar infarcts noted within the left basal ganglia/ corona radiata. Additional remote lacunar infarct present within the left thalamus. Patchy restricted diffusion within the superior left cerebellar hemisphere, consistent with acute ischemic infarct. Additional patchy infarct within the left aspect  of the midbrain/ pons (series 5, image 20). No associated hemorrhage or mass effect. Gray-white matter differentiation otherwise maintained. No other evidence for acute or subacute ischemia. Few punctate chronic micro hemorrhages noted within the left centrum semi ovale. These may be hypertensive in nature. No mass lesion, midline shift or mass effect. Ventricles normal size without evidence for hydrocephalus. No extra-axial fluid collection. Major dural sinuses are grossly patent. Pituitary gland of normal size. Tiny 3 mm T1 hyperintense lesion within the central aspect of the pituitary gland noted, indeterminate. Midline structures intact and normal. Vascular: Major intracranial vascular flow voids are maintained. Skull and upper cervical spine: Craniocervical junction normal. Visualized upper cervical spine unremarkable. Bone marrow signal intensity within normal limits. No scalp soft tissue abnormality. Sinuses/Orbits: Globes and orbital soft tissues within normal limits. Paranasal sinuses are clear. No mastoid effusion. Inner ear structures normal. Other: None. MRA HEAD FINDINGS ANTERIOR CIRCULATION: Distal cervical segments of the internal carotid arteries are patent with antegrade flow. Petrous segments patent bilaterally without stenosis. Multifocal atheromatous irregularity present throughout the carotid siphons with moderate multifocal irregular narrowing. ICA termini patent. Left A1 segment dominant. Right A1 segment hypoplastic and/ or absent. Atheromatous irregularity present throughout the anterior cerebral arteries without high-grade flow-limiting stenosis. ACA is are patent to their distal aspects. M1 segments patent without high-grade stenosis or occlusion. No proximal M2 occlusion. Distal small vessel atheromatous irregularity throughout the MCA branches bilaterally. POSTERIOR CIRCULATION: Vertebral arteries code dominant and widely patent to the vertebrobasilar junction. Right PICA patent  proximally. Left PICA not visualized. Basilar artery widely patent. Right SCA irregular but patent to its distal aspect. Severe stenosis present within the proximal left SCA (series 455). Flow distally is severely attenuated. PCAs arise from the basilar artery. Mild to moderate multifocal atheromatous irregularity involving the PCAs bilaterally without high-grade flow-limiting stenosis. PCAs are patent to their distal aspects. No aneurysm or vascular malformation. IMPRESSION: MRI HEAD IMPRESSION: 1. Patchy multifocal acute ischemic infarcts involving the superior left cerebellar hemisphere and left midbrain/pons. No associated hemorrhage or mass effect. 2. Scattered remote lacunar infarcts involving the bilateral basal ganglia/corona radiata as well as the left thalamus. 3. 3 mm pituitary lesion, indeterminate. Finding could be further assessed with nonemergent pituitary mass protocol MRI. MRA HEAD IMPRESSION: 1. Negative MRA for large or proximal arterial branch occlusion. 2. Severe proximal left SCA stenosis with attenuated flow distally. No other high-grade or correctable stenosis identified. 3. Additional moderate multifocal atheromatous irregularity involving the anterior and posterior circulation as above. Electronically Signed   By: Jeannine Boga M.D.   On: 11/03/2016 01:50   Dg Foot Complete Right  Result Date: 11/02/2016 CLINICAL DATA:  66 y/o  M; stepped on a roofing nail. EXAM: RIGHT FOOT COMPLETE -  3+ VIEW COMPARISON:  None. FINDINGS: No acute fracture or dislocation. Second digit amputation absent phalanges. Lisfranc alignment is maintained. No articular abnormality is evident. IMPRESSION: No acute bony or articular abnormality identified. Electronically Signed   By: Kristine Garbe M.D.   On: 11/02/2016 20:54   Mr Jodene Nam Head/brain VH Cm  Result Date: 11/03/2016 CLINICAL DATA:  Initial evaluation for acute dizziness. EXAM: MRI HEAD WITHOUT CONTRAST MRA HEAD WITHOUT CONTRAST  TECHNIQUE: Multiplanar, multiecho pulse sequences of the brain and surrounding structures were obtained without intravenous contrast. Angiographic images of the head were obtained using MRA technique without contrast. COMPARISON:  Prior CT from 11/02/2016. FINDINGS: MRI HEAD FINDINGS Brain: Cerebral volume within normal limits. Few scatter remote lacunar infarcts noted within the left basal ganglia/ corona radiata. Additional remote lacunar infarct present within the left thalamus. Patchy restricted diffusion within the superior left cerebellar hemisphere, consistent with acute ischemic infarct. Additional patchy infarct within the left aspect of the midbrain/ pons (series 5, image 20). No associated hemorrhage or mass effect. Gray-white matter differentiation otherwise maintained. No other evidence for acute or subacute ischemia. Few punctate chronic micro hemorrhages noted within the left centrum semi ovale. These may be hypertensive in nature. No mass lesion, midline shift or mass effect. Ventricles normal size without evidence for hydrocephalus. No extra-axial fluid collection. Major dural sinuses are grossly patent. Pituitary gland of normal size. Tiny 3 mm T1 hyperintense lesion within the central aspect of the pituitary gland noted, indeterminate. Midline structures intact and normal. Vascular: Major intracranial vascular flow voids are maintained. Skull and upper cervical spine: Craniocervical junction normal. Visualized upper cervical spine unremarkable. Bone marrow signal intensity within normal limits. No scalp soft tissue abnormality. Sinuses/Orbits: Globes and orbital soft tissues within normal limits. Paranasal sinuses are clear. No mastoid effusion. Inner ear structures normal. Other: None. MRA HEAD FINDINGS ANTERIOR CIRCULATION: Distal cervical segments of the internal carotid arteries are patent with antegrade flow. Petrous segments patent bilaterally without stenosis. Multifocal atheromatous  irregularity present throughout the carotid siphons with moderate multifocal irregular narrowing. ICA termini patent. Left A1 segment dominant. Right A1 segment hypoplastic and/ or absent. Atheromatous irregularity present throughout the anterior cerebral arteries without high-grade flow-limiting stenosis. ACA is are patent to their distal aspects. M1 segments patent without high-grade stenosis or occlusion. No proximal M2 occlusion. Distal small vessel atheromatous irregularity throughout the MCA branches bilaterally. POSTERIOR CIRCULATION: Vertebral arteries code dominant and widely patent to the vertebrobasilar junction. Right PICA patent proximally. Left PICA not visualized. Basilar artery widely patent. Right SCA irregular but patent to its distal aspect. Severe stenosis present within the proximal left SCA (series 455). Flow distally is severely attenuated. PCAs arise from the basilar artery. Mild to moderate multifocal atheromatous irregularity involving the PCAs bilaterally without high-grade flow-limiting stenosis. PCAs are patent to their distal aspects. No aneurysm or vascular malformation. IMPRESSION: MRI HEAD IMPRESSION: 1. Patchy multifocal acute ischemic infarcts involving the superior left cerebellar hemisphere and left midbrain/pons. No associated hemorrhage or mass effect. 2. Scattered remote lacunar infarcts involving the bilateral basal ganglia/corona radiata as well as the left thalamus. 3. 3 mm pituitary lesion, indeterminate. Finding could be further assessed with nonemergent pituitary mass protocol MRI. MRA HEAD IMPRESSION: 1. Negative MRA for large or proximal arterial branch occlusion. 2. Severe proximal left SCA stenosis with attenuated flow distally. No other high-grade or correctable stenosis identified. 3. Additional moderate multifocal atheromatous irregularity involving the anterior and posterior circulation as above. Electronically Signed   By: Jeannine Boga  M.D.   On:  11/03/2016 01:50    echocardiogram  LV EF: 60% -   65%  ------------------------------------------------------------------- Indications:      CVA 436.  ------------------------------------------------------------------- History:   PMH:  Asthma, Right Bundle Branch Block.  Risk factors:  Hypertension. Diabetes mellitus.  ------------------------------------------------------------------- Study Conclusions  - Left ventricle: The cavity size was normal. Wall thickness was   increased in a pattern of severe LVH. Systolic function was   normal. The estimated ejection fraction was in the range of 60%   to 65%. Wall motion was normal; there were no regional wall   motion abnormalities. Doppler parameters are consistent with   abnormal left ventricular relaxation (grade 1 diastolic   dysfunction). - Left atrium: The atrium was mildly dilated.  Impressions:  - Normal LV systolic function; mild diastolic dysfunction; severe   LVH with proximal septal thickening; no LVOT gradient at rest; no   SAM; mild LAE.       Filed Weights   11/03/16 0145 11/04/16 0258  Weight: 74 kg (163 lb 3.2 oz) 80.1 kg (176 lb 9.6 oz)     Microbiology: No results found for this or any previous visit (from the past 240 hour(s)).     Blood Culture No results found for: SDES, SPECREQUEST, CULT, REPTSTATUS    Labs: Results for orders placed or performed during the hospital encounter of 11/02/16 (from the past 48 hour(s))  Comprehensive metabolic panel     Status: Abnormal   Collection Time: 11/02/16  6:41 PM  Result Value Ref Range   Sodium 134 (L) 135 - 145 mmol/L   Potassium 3.6 3.5 - 5.1 mmol/L   Chloride 102 101 - 111 mmol/L   CO2 23 22 - 32 mmol/L   Glucose, Bld 197 (H) 65 - 99 mg/dL   BUN 18 6 - 20 mg/dL   Creatinine, Ser 1.78 (H) 0.61 - 1.24 mg/dL   Calcium 8.6 (L) 8.9 - 10.3 mg/dL   Total Protein 6.9 6.5 - 8.1 g/dL   Albumin 3.3 (L) 3.5 - 5.0 g/dL   AST 29 15 - 41 U/L    ALT 30 17 - 63 U/L   Alkaline Phosphatase 99 38 - 126 U/L   Total Bilirubin 0.7 0.3 - 1.2 mg/dL   GFR calc non Af Amer 39 (L) >60 mL/min   GFR calc Af Amer 45 (L) >60 mL/min    Comment: (NOTE) The eGFR has been calculated using the CKD EPI equation. This calculation has not been validated in all clinical situations. eGFR's persistently <60 mL/min signify possible Chronic Kidney Disease.    Anion gap 9 5 - 15  CBC     Status: Abnormal   Collection Time: 11/02/16  6:41 PM  Result Value Ref Range   WBC 14.3 (H) 4.0 - 10.5 K/uL   RBC 4.07 (L) 4.22 - 5.81 MIL/uL   Hemoglobin 11.8 (L) 13.0 - 17.0 g/dL   HCT 35.9 (L) 39.0 - 52.0 %   MCV 88.2 78.0 - 100.0 fL   MCH 29.0 26.0 - 34.0 pg   MCHC 32.9 30.0 - 36.0 g/dL   RDW 13.6 11.5 - 15.5 %   Platelets 179 150 - 400 K/uL  Urinalysis, Routine w reflex microscopic     Status: Abnormal   Collection Time: 11/02/16  7:09 PM  Result Value Ref Range   Color, Urine YELLOW YELLOW   APPearance CLEAR CLEAR   Specific Gravity, Urine 1.017 1.005 - 1.030   pH 5.0 5.0 -  8.0   Glucose, UA >=500 (A) NEGATIVE mg/dL   Hgb urine dipstick SMALL (A) NEGATIVE   Bilirubin Urine NEGATIVE NEGATIVE   Ketones, ur NEGATIVE NEGATIVE mg/dL   Protein, ur >=300 (A) NEGATIVE mg/dL   Nitrite NEGATIVE NEGATIVE   Leukocytes, UA NEGATIVE NEGATIVE   RBC / HPF 6-30 0 - 5 RBC/hpf   WBC, UA 0-5 0 - 5 WBC/hpf   Bacteria, UA RARE (A) NONE SEEN   Squamous Epithelial / LPF 0-5 (A) NONE SEEN   Mucous PRESENT    Hyaline Casts, UA PRESENT   C-reactive protein     Status: Abnormal   Collection Time: 11/02/16  8:18 PM  Result Value Ref Range   CRP 1.2 (H) <1.0 mg/dL  Sedimentation rate     Status: Abnormal   Collection Time: 11/02/16  8:18 PM  Result Value Ref Range   Sed Rate 64 (H) 0 - 16 mm/hr  I-stat troponin, ED     Status: None   Collection Time: 11/02/16  8:31 PM  Result Value Ref Range   Troponin i, poc 0.03 0.00 - 0.08 ng/mL   Comment 3            Comment: Due to  the release kinetics of cTnI, a negative result within the first hours of the onset of symptoms does not rule out myocardial infarction with certainty. If myocardial infarction is still suspected, repeat the test at appropriate intervals.   CBG monitoring, ED     Status: Abnormal   Collection Time: 11/03/16  1:17 AM  Result Value Ref Range   Glucose-Capillary 237 (H) 65 - 99 mg/dL  HIV antibody (Routine Testing)     Status: None   Collection Time: 11/03/16  6:04 AM  Result Value Ref Range   HIV Screen 4th Generation wRfx Non Reactive Non Reactive    Comment: (NOTE) Performed At: [facility] [place], [place] [id number] Lindon Romp MD [id number]   Hemoglobin A1c     Status: Abnormal   Collection Time: 11/03/16  6:04 AM  Result Value Ref Range   Hgb A1c MFr Bld 8.6 (H) 4.8 - 5.6 %    Comment: (NOTE)         Pre-diabetes: 5.7 - 6.4         Diabetes: >6.4         Glycemic control for adults with diabetes: <7.0    Mean Plasma Glucose 200 mg/dL    Comment: (NOTE) Performed At: [facility] [place], [place] [id number] Lindon Romp MD [id number]   Lipid panel     Status: Abnormal   Collection Time: 11/03/16  6:04 AM  Result Value Ref Range   Cholesterol 149 0 - 200 mg/dL   Triglycerides 91 <150 mg/dL   HDL 37 (L) >40 mg/dL   Total CHOL/HDL Ratio 4.0 RATIO   VLDL 18 0 - 40 mg/dL   LDL Cholesterol 94 0 - 99 mg/dL    Comment:        Total Cholesterol/HDL:CHD Risk Coronary Heart Disease Risk Table                     Men   Women  1/2 Average Risk   3.4   3.3  Average Risk       5.0   4.4  2 X Average Risk   9.6   7.1  3 X Average Risk  23.4   11.0  Use the calculated Patient Ratio above and the CHD Risk Table to determine the patient's CHD Risk.        ATP III CLASSIFICATION (LDL):  <100     mg/dL   Optimal  100-129  mg/dL   Near or Above                    Optimal  130-159  mg/dL   Borderline   160-189  mg/dL   High  >190     mg/dL   Very High   CBC with Differential/Platelet     Status: Abnormal   Collection Time: 11/03/16  6:29 AM  Result Value Ref Range   WBC 10.8 (H) 4.0 - 10.5 K/uL   RBC 3.55 (L) 4.22 - 5.81 MIL/uL   Hemoglobin 10.5 (L) 13.0 - 17.0 g/dL   HCT 31.6 (L) 39.0 - 52.0 %   MCV 89.0 78.0 - 100.0 fL   MCH 29.6 26.0 - 34.0 pg   MCHC 33.2 30.0 - 36.0 g/dL   RDW 13.9 11.5 - 15.5 %   Platelets 159 150 - 400 K/uL   Neutrophils Relative % 56 %   Lymphocytes Relative 32 %   Monocytes Relative 10 %   Eosinophils Relative 2 %   Basophils Relative 0 %   Neutro Abs 6.0 1.7 - 7.7 K/uL   Lymphs Abs 3.5 0.7 - 4.0 K/uL   Monocytes Absolute 1.1 (H) 0.1 - 1.0 K/uL   Eosinophils Absolute 0.2 0.0 - 0.7 K/uL   Basophils Absolute 0.0 0.0 - 0.1 K/uL   WBC Morphology ATYPICAL LYMPHOCYTES     Comment: RARE  Basic metabolic panel     Status: Abnormal   Collection Time: 11/03/16  6:29 AM  Result Value Ref Range   Sodium 136 135 - 145 mmol/L   Potassium 3.3 (L) 3.5 - 5.1 mmol/L   Chloride 104 101 - 111 mmol/L   CO2 26 22 - 32 mmol/L   Glucose, Bld 200 (H) 65 - 99 mg/dL   BUN 19 6 - 20 mg/dL   Creatinine, Ser 1.83 (H) 0.61 - 1.24 mg/dL   Calcium 8.3 (L) 8.9 - 10.3 mg/dL   GFR calc non Af Amer 38 (L) >60 mL/min   GFR calc Af Amer 44 (L) >60 mL/min    Comment: (NOTE) The eGFR has been calculated using the CKD EPI equation. This calculation has not been validated in all clinical situations. eGFR's persistently <60 mL/min signify possible Chronic Kidney Disease.    Anion gap 6 5 - 15  Glucose, capillary     Status: Abnormal   Collection Time: 11/03/16  8:05 AM  Result Value Ref Range   Glucose-Capillary 154 (H) 65 - 99 mg/dL  Glucose, capillary     Status: Abnormal   Collection Time: 11/03/16 11:36 AM  Result Value Ref Range   Glucose-Capillary 113 (H) 65 - 99 mg/dL  Glucose, capillary     Status: Abnormal   Collection Time: 11/03/16  5:00 PM  Result Value Ref Range    Glucose-Capillary 162 (H) 65 - 99 mg/dL  Glucose, capillary     Status: Abnormal   Collection Time: 11/03/16  9:12 PM  Result Value Ref Range   Glucose-Capillary 124 (H) 65 - 99 mg/dL   Comment 1 Notify RN   Glucose, capillary     Status: Abnormal   Collection Time: 11/04/16  7:49 AM  Result Value Ref Range   Glucose-Capillary 149 (H) 65 - 99  mg/dL  Glucose, capillary     Status: None   Collection Time: 11/04/16 12:49 PM  Result Value Ref Range   Glucose-Capillary 73 65 - 99 mg/dL     Lipid Panel     Component Value Date/Time   CHOL 149 11/03/2016 0604   TRIG 91 11/03/2016 0604   HDL 37 (L) 11/03/2016 0604   CHOLHDL 4.0 11/03/2016 0604   VLDL 18 11/03/2016 0604   LDLCALC 94 11/03/2016 0604     Lab Results  Component Value Date   HGBA1C 8.6 (H) 11/03/2016   HGBA1C 7.2 (H) 03/31/2016      HPI   61 y.o.right handed malewith history of asthma, diabetes mellitus, hypertension, right bundle branch block, tobacco abuse, CKD stage III, chronic diastolic congestive heart failure, presents with a 6 day history of gait unsteadiness with weakness and Incoordination on the left. Symptoms began last Friday after he stepped on a roofing nail. He also noted posterior neck pain about 2 days later. He also has experienced a sensation of dizziness that is partially vertiginous.  Presented 11/02/2016 with dizziness as well as gait disturbance and left-sided numbness over the past 4 days as well as reported fall. Patient also reported that he had stepped on a nail with his right foot. CT/MRI showed patchy multifocal acute ischemic infarcts involving the superior left cerebellar hemisphere and left midbrain/pons. . Scattered remote lacunar infarcts involving the bilateral basal ganglia corona radiata as well as left thalamus. 3 mm pituitary lesion indeterminate. MRA negative for large vessel occlusion. Patient did not receive TPA. Echocardiogram with ejection fraction 65% and grade 1 diastolic  dysfunction. 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  Scattered remote lacunar infarcts involving the bilateral basal ganglia/corona radiata as well as the left thalamus. 3 mm pituitary lesion, indeterminate. Finding could be further assessed with nonemergent pituitary mass protocol MRI. , MRA head:  Negative MRA for large or proximal arterial branch occlusion.  Severe proximal left SCA stenosis with attenuated flow distally - carotid US ......., echocardiogram shows EF of 60-65%, normal LV systolic function , and U8K 8.6 - Dr. Erlinda Hong evaluated the patient prior to discharge and recommended aspirin and Plavix for 3 months and then Plavix alone -LDL > 70-- start statin -tobacco cessation encouraged    Puncture wound, right foot  - Pt reports stepping on nail on 10/28/16  - There is mild leukocytosis and mild elevations in CRP and ESR noted, but no fever, and does not appears infected clinically  - Radiographs negative  - Tdap updated in ED  Hypertension with hypertensive urgency  - BP 200/100 range in ED - Managed at home with Norvasc, Coreg, clonidine, and hydralazine-- resume  CKD stage III  - SCr is 1.78 on admission, consistent with apparent baseline  - Avoid nephrotoxins where feasible, follow renal function closely  Chronic diastolic CHF  - TTE (80/03/49) with EF 60-65%, moderate concentric and severely asymmetric hypertrophy, grade 2 diastolic dysfunction, no significant valvular disease  - Appears  euvolemic on admission  - Managed with Coreg at home, no ACE/ARB or diuretic  Anemia of chronic disease  - Hgb is 11.8 on admission, stable relative to priors   Chronic pain  - Stable, 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 rate 20, height '5\' 7"'  (1.702 m), weight 80.1 kg (176 lb 9.6 oz), SpO2 100 %.  General exam: Appears calm and comfortable  Respiratory system: Clear to auscultation. Respiratory effort normal. Cardiovascular system: S1 & S2 heard, RRR. No JVD, murmurs, rubs, gallops or clicks. No pedal edema. Gastrointestinal system: Abdomen is nondistended, soft and nontender. No organomegaly or masses felt. Normal bowel sounds heard. Central nervous system: mild left sided weakness Psychiatry: Judgement and insight appear normal. Mood & affect appropriate.       SignedReyne Dumas 11/04/2016, 1:06 PM        Time spent >45 mins

## 2016-11-04 NOTE — Progress Notes (Signed)
Inpatient Rehabilitation  Met with patient to discuss team's recommendation for IP Rehab.  Shared booklets and answered question.  Patient is eager to regain his independence and participate in therapies to do so.  I have a bed available to offer patient today and await medical clearance prior to potential IP Rehab admission today.  Please call with questions.   Carmelia Roller., CCC/SLP Admission Coordinator  [facility]  Cell [phone]

## 2016-11-04 NOTE — H&P (Signed)
Physical Medicine and Rehabilitation Admission H&P    Chief Complaint  Patient presents with  . Weakness  : HPI: John Jacobson is a 61 y.o. right handed male with history of asthma, diabetes mellitus, hypertension, right bundle branch block, tobacco abuse, CKD stage III, chronic diastolic congestive heart failure. 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 One level home. Brother recently had CABG. Presented 11/02/2016 with dizziness as well as gait disturbance and left-sided numbness over the past 4 days as well as reported fall. Patient also reported that he had stepped on a nail with his right foot. CT/MRI showed patchy multifocal acute ischemic infarcts involving the superior left cerebellar hemisphere and left midbrain/pons. No associated hemorrhage or mass effect. Scattered remote lacunar infarcts involving the bilateral basal ganglia corona radiata as well as left thalamus. 3 mm pituitary lesion indeterminate. MRA negative for large vessel occlusion. Patient did not receive TPA. Echocardiogram with ejection fraction 65% and grade 1 diastolic dysfunction.Carotid Dopplers with no ICA stenosis. Neurology consulted presently on Plavix for CVA prophylaxis. Tolerating a regular diet. Subcutaneous heparin added for DVT prophylaxis. Physical and occupational therapy evaluation completed 11/03/2016 with recommendations of physical medicine rehabilitation consult. Patient was admitted for a comprehensive rehabilitation program  Review of Systems  Constitutional: Negative for chills and fever.  HENT: Negative for hearing loss.   Eyes: Negative for blurred vision and double vision.  Respiratory: Negative for cough and shortness of breath.   Cardiovascular: Negative for chest pain, palpitations and leg swelling.  Gastrointestinal: Positive for constipation. Negative for nausea and vomiting.    Genitourinary: Negative for dysuria, hematuria and urgency.  Musculoskeletal: Positive for back pain and myalgias.  Skin: Negative for rash.  Neurological: Positive for dizziness and weakness. Negative for seizures.  All other systems reviewed and are negative.  Past Medical History:  Diagnosis Date  . Asthma   . DM (diabetes mellitus) ([place])   . HTN (hypertension)   . RBBB (right bundle branch block)    Past Surgical History:  Procedure Laterality Date  . BACK SURGERY    . HAND SURGERY     Family History  Problem Relation Age of Onset  . Coronary artery disease Mother        MI in her 49s  . Hypertension Unknown   . Diabetes Unknown   . Alzheimer's disease Unknown    Social History:  reports that he has been smoking.  He has never used smokeless tobacco. He reports that he drinks alcohol. He reports that he does not use drugs. Allergies:  Allergies  Allergen Reactions  . Amlodipine Besy-Benazepril Hcl Shortness Of Breath and Swelling    Mouth and tongue swelling  . Shellfish Allergy Anaphylaxis   Medications Prior to Admission  Medication Sig Dispense Refill  . aspirin EC 81 MG EC tablet Take 1 tablet (81 mg total) by mouth daily. 30 tablet 1  . Carboxymethylcellulose Sod PF 0.25 % SOLN Apply 1 drop to eye 4 (four) times daily.    . cloNIDine (CATAPRES) 0.3 MG tablet Take 1 tablet (0.3 mg total) by mouth 3 (three) times daily. 90 tablet 1  . doxazosin (CARDURA) 4 MG tablet Take 2 mg by mouth 2 (two) times daily.    . Elbasvir-Grazoprevir 50-100 MG TABS Take 1 tablet by mouth daily.    . furosemide (LASIX) 40 MG tablet Take 40 mg by mouth daily.    Marland Kitchen  glipiZIDE (GLUCOTROL) 10 MG tablet Take 10 mg by mouth daily before breakfast.    . glucose 4 GM chewable tablet Chew 1 tablet by mouth daily as needed for low blood sugar.    . hydrALAZINE (APRESOLINE) 100 MG tablet Take 1 tablet (100 mg total) by mouth 3 (three) times daily. 90 tablet 1  . insulin glargine (LANTUS) 100  UNIT/ML injection Inject 16-19 Units into the skin at bedtime. Sliding scale.     . losartan (COZAAR) 50 MG tablet Take 50 mg by mouth daily.    Marland Kitchen oxyCODONE (OXY IR/ROXICODONE) 5 MG immediate release tablet Take 1-2 tablets (5-10 mg total) by mouth every 4 (four) hours as needed for moderate pain. 15 tablet 0  . polyethylene glycol (MIRALAX / GLYCOLAX) packet Take 17 g by mouth daily. (Patient taking differently: Take 17 g by mouth daily as needed for mild constipation. ) 14 each 0  . senna (SENOKOT) 8.6 MG TABS tablet Take 1 tablet (8.6 mg total) by mouth daily. (Patient taking differently: Take 1 tablet by mouth daily as needed for mild constipation. ) 120 each 0  . Skin Protectants, Misc. (EUCERIN) cream Apply 1 application topically daily. On feet      Home: Home Living Family/patient expects to be discharged to:: Private residence Living Arrangements: Other relatives (brother) Available Help at Discharge: Family, Available PRN/intermittently Type of Home: House Home Access: Level entry Home Layout: One level Bathroom Shower/Tub: Chiropodist: Standard Home Equipment: None   Functional History: Prior Function Level of Independence: Independent  Functional Status:  Mobility: Bed Mobility Overal bed mobility: Needs Assistance Bed Mobility: Supine to Sit Supine to sit: Supervision, HOB elevated General bed mobility comments: pt in chair Transfers Overall transfer level: Needs assistance Equipment used: Rolling walker (2 wheeled) Transfers: Sit to/from Stand Sit to Stand: Min guard General transfer comment: slow, cues for hand placement, min guard for safety Ambulation/Gait Ambulation/Gait assistance: Mod assist Ambulation Distance (Feet): 90 Feet Assistive device: 1 person hand held assist Gait Pattern/deviations: Step-through pattern, Decreased stride length, Ataxic, Drifts right/left General Gait Details: Assist for balance and support Gait velocity:  decr Gait velocity interpretation: Below normal speed for age/gender    ADL: ADL Overall ADL's : Needs assistance/impaired Eating/Feeding: Independent, Sitting Grooming: Wash/dry hands, Standing, Min guard Upper Body Bathing: Set up, Sitting Lower Body Bathing: Minimal assistance, Sit to/from stand Lower Body Bathing Details (indicate cue type and reason): able to wash feet in sitting with set up Upper Body Dressing : Set up, Sitting Lower Body Dressing: Minimal assistance, Sit to/from stand Lower Body Dressing Details (indicate cue type and reason): able to don and doff socks Toilet Transfer: Minimal assistance, RW, Ambulation Toileting- Clothing Manipulation and Hygiene: Minimal assistance, Sit to/from stand Functional mobility during ADLs: Minimal assistance, Rolling walker  Cognition: Cognition Overall Cognitive Status: Within Functional Limits for tasks assessed Arousal/Alertness: Awake/alert Orientation Level: Oriented X4 Attention: Selective Selective Attention: Appears intact Safety/Judgment: Appears intact Cognition Arousal/Alertness: Awake/alert Behavior During Therapy: WFL for tasks assessed/performed Overall Cognitive Status: Within Functional Limits for tasks assessed  Physical Exam: Blood pressure (!) 150/78, pulse (!) 51, temperature 98.2 F (36.8 C), temperature source Oral, resp. rate 20, height '5\' 7"'  (1.702 m), weight 80.1 kg (176 lb 9.6 oz), SpO2 100 %. Physical Exam  Vitals reviewed. Constitutional: He appears well-developed.  HENT:  Head: Normocephalic and atraumatic.  Eyes: EOM are normal. Pupils are equal, round, and reactive to light. Right eye exhibits no discharge. Left eye exhibits  no discharge.  Neck: Normal range of motion. Neck supple. No JVD present. No tracheal deviation present. No thyromegaly present.  Cardiovascular: Normal rate and regular rhythm.  Exam reveals no friction rub.   No murmur heard. Respiratory: Breath sounds normal. No  respiratory distress. He has no wheezes. He has no rales.  GI: Soft. Bowel sounds are normal. He exhibits no distension.  Psychiatric: He has a normal mood and affect. His behavior is normal.  Skin. Warm and dry Neurological: He is alert and oriented to person, place, and time.  Follows simple commands. Reasonable insight and awarenes Skin: Skin is warm and dry.  Ataxia left arm and leg with finger to nose and heel to shin. . Motor 5/5 proximal to distal RUE and RLE.  4/5 proximal to distal LUE and LLE with left hand intrinsic atrophy. Tinel's at the elbow on the left side. Sensation intact to light touch bilateral upper and lower extremities. CN grossly intact   Results for orders placed or performed during the hospital encounter of 11/02/16 (from the past 48 hour(s))  Comprehensive metabolic panel     Status: Abnormal   Collection Time: 11/02/16  6:41 PM  Result Value Ref Range   Sodium 134 (L) 135 - 145 mmol/L   Potassium 3.6 3.5 - 5.1 mmol/L   Chloride 102 101 - 111 mmol/L   CO2 23 22 - 32 mmol/L   Glucose, Bld 197 (H) 65 - 99 mg/dL   BUN 18 6 - 20 mg/dL   Creatinine, Ser 1.78 (H) 0.61 - 1.24 mg/dL   Calcium 8.6 (L) 8.9 - 10.3 mg/dL   Total Protein 6.9 6.5 - 8.1 g/dL   Albumin 3.3 (L) 3.5 - 5.0 g/dL   AST 29 15 - 41 U/L   ALT 30 17 - 63 U/L   Alkaline Phosphatase 99 38 - 126 U/L   Total Bilirubin 0.7 0.3 - 1.2 mg/dL   GFR calc non Af Amer 39 (L) >60 mL/min   GFR calc Af Amer 45 (L) >60 mL/min    Comment: (NOTE) The eGFR has been calculated using the CKD EPI equation. This calculation has not been validated in all clinical situations. eGFR's persistently <60 mL/min signify possible Chronic Kidney Disease.    Anion gap 9 5 - 15  CBC     Status: Abnormal   Collection Time: 11/02/16  6:41 PM  Result Value Ref Range   WBC 14.3 (H) 4.0 - 10.5 K/uL   RBC 4.07 (L) 4.22 - 5.81 MIL/uL   Hemoglobin 11.8 (L) 13.0 - 17.0 g/dL   HCT 35.9 (L) 39.0 - 52.0 %   MCV 88.2 78.0 - 100.0  fL   MCH 29.0 26.0 - 34.0 pg   MCHC 32.9 30.0 - 36.0 g/dL   RDW 13.6 11.5 - 15.5 %   Platelets 179 150 - 400 K/uL  Urinalysis, Routine w reflex microscopic     Status: Abnormal   Collection Time: 11/02/16  7:09 PM  Result Value Ref Range   Color, Urine YELLOW YELLOW   APPearance CLEAR CLEAR   Specific Gravity, Urine 1.017 1.005 - 1.030   pH 5.0 5.0 - 8.0   Glucose, UA >=500 (A) NEGATIVE mg/dL   Hgb urine dipstick SMALL (A) NEGATIVE   Bilirubin Urine NEGATIVE NEGATIVE   Ketones, ur NEGATIVE NEGATIVE mg/dL   Protein, ur >=300 (A) NEGATIVE mg/dL   Nitrite NEGATIVE NEGATIVE   Leukocytes, UA NEGATIVE NEGATIVE   RBC / HPF 6-30 0 - 5  RBC/hpf   WBC, UA 0-5 0 - 5 WBC/hpf   Bacteria, UA RARE (A) NONE SEEN   Squamous Epithelial / LPF 0-5 (A) NONE SEEN   Mucous PRESENT    Hyaline Casts, UA PRESENT   C-reactive protein     Status: Abnormal   Collection Time: 11/02/16  8:18 PM  Result Value Ref Range   CRP 1.2 (H) <1.0 mg/dL  Sedimentation rate     Status: Abnormal   Collection Time: 11/02/16  8:18 PM  Result Value Ref Range   Sed Rate 64 (H) 0 - 16 mm/hr  I-stat troponin, ED     Status: None   Collection Time: 11/02/16  8:31 PM  Result Value Ref Range   Troponin i, poc 0.03 0.00 - 0.08 ng/mL   Comment 3            Comment: Due to the release kinetics of cTnI, a negative result within the first hours of the onset of symptoms does not rule out myocardial infarction with certainty. If myocardial infarction is still suspected, repeat the test at appropriate intervals.   CBG monitoring, ED     Status: Abnormal   Collection Time: 11/03/16  1:17 AM  Result Value Ref Range   Glucose-Capillary 237 (H) 65 - 99 mg/dL  HIV antibody (Routine Testing)     Status: None   Collection Time: 11/03/16  6:04 AM  Result Value Ref Range   HIV Screen 4th Generation wRfx Non Reactive Non Reactive    Comment: (NOTE) Performed At: [facility] [place], [place] [id number] Lindon Romp MD [id number]   Hemoglobin A1c     Status: Abnormal   Collection Time: 11/03/16  6:04 AM  Result Value Ref Range   Hgb A1c MFr Bld 8.6 (H) 4.8 - 5.6 %    Comment: (NOTE)         Pre-diabetes: 5.7 - 6.4         Diabetes: >6.4         Glycemic control for adults with diabetes: <7.0    Mean Plasma Glucose 200 mg/dL    Comment: (NOTE) Performed At: [facility] [place], [place] [id number] Lindon Romp MD [id number]   Lipid panel     Status: Abnormal   Collection Time: 11/03/16  6:04 AM  Result Value Ref Range   Cholesterol 149 0 - 200 mg/dL   Triglycerides 91 <150 mg/dL   HDL 37 (L) >40 mg/dL   Total CHOL/HDL Ratio 4.0 RATIO   VLDL 18 0 - 40 mg/dL   LDL Cholesterol 94 0 - 99 mg/dL    Comment:        Total Cholesterol/HDL:CHD Risk Coronary Heart Disease Risk Table                     Men   Women  1/2 Average Risk   3.4   3.3  Average Risk       5.0   4.4  2 X Average Risk   9.6   7.1  3 X Average Risk  23.4   11.0        Use the calculated Patient Ratio above and the CHD Risk Table to determine the patient's CHD Risk.        ATP III CLASSIFICATION (LDL):  <100     mg/dL   Optimal  100-129  mg/dL   Near or Above  Optimal  130-159  mg/dL   Borderline  160-189  mg/dL   High  >190     mg/dL   Very High   CBC with Differential/Platelet     Status: Abnormal   Collection Time: 11/03/16  6:29 AM  Result Value Ref Range   WBC 10.8 (H) 4.0 - 10.5 K/uL   RBC 3.55 (L) 4.22 - 5.81 MIL/uL   Hemoglobin 10.5 (L) 13.0 - 17.0 g/dL   HCT 31.6 (L) 39.0 - 52.0 %   MCV 89.0 78.0 - 100.0 fL   MCH 29.6 26.0 - 34.0 pg   MCHC 33.2 30.0 - 36.0 g/dL   RDW 13.9 11.5 - 15.5 %   Platelets 159 150 - 400 K/uL   Neutrophils Relative % 56 %   Lymphocytes Relative 32 %   Monocytes Relative 10 %   Eosinophils Relative 2 %   Basophils Relative 0 %   Neutro Abs 6.0 1.7 - 7.7 K/uL   Lymphs Abs 3.5 0.7 - 4.0 K/uL   Monocytes Absolute  1.1 (H) 0.1 - 1.0 K/uL   Eosinophils Absolute 0.2 0.0 - 0.7 K/uL   Basophils Absolute 0.0 0.0 - 0.1 K/uL   WBC Morphology ATYPICAL LYMPHOCYTES     Comment: RARE  Basic metabolic panel     Status: Abnormal   Collection Time: 11/03/16  6:29 AM  Result Value Ref Range   Sodium 136 135 - 145 mmol/L   Potassium 3.3 (L) 3.5 - 5.1 mmol/L   Chloride 104 101 - 111 mmol/L   CO2 26 22 - 32 mmol/L   Glucose, Bld 200 (H) 65 - 99 mg/dL   BUN 19 6 - 20 mg/dL   Creatinine, Ser 1.83 (H) 0.61 - 1.24 mg/dL   Calcium 8.3 (L) 8.9 - 10.3 mg/dL   GFR calc non Af Amer 38 (L) >60 mL/min   GFR calc Af Amer 44 (L) >60 mL/min    Comment: (NOTE) The eGFR has been calculated using the CKD EPI equation. This calculation has not been validated in all clinical situations. eGFR's persistently <60 mL/min signify possible Chronic Kidney Disease.    Anion gap 6 5 - 15  Glucose, capillary     Status: Abnormal   Collection Time: 11/03/16  8:05 AM  Result Value Ref Range   Glucose-Capillary 154 (H) 65 - 99 mg/dL  Glucose, capillary     Status: Abnormal   Collection Time: 11/03/16 11:36 AM  Result Value Ref Range   Glucose-Capillary 113 (H) 65 - 99 mg/dL  Glucose, capillary     Status: Abnormal   Collection Time: 11/03/16  5:00 PM  Result Value Ref Range   Glucose-Capillary 162 (H) 65 - 99 mg/dL  Glucose, capillary     Status: Abnormal   Collection Time: 11/03/16  9:12 PM  Result Value Ref Range   Glucose-Capillary 124 (H) 65 - 99 mg/dL   Comment 1 Notify RN    Dg Chest 2 View  Result Date: 11/02/2016 CLINICAL DATA:  61 y/o  M; weakness. EXAM: CHEST  2 VIEW COMPARISON:  03/28/2016 chest radiograph FINDINGS: Stable heart size and mediastinal contours are within normal limits given projection and technique. Both lungs are clear. Mild thoracic spine dextrocurvature. IMPRESSION: No active cardiopulmonary disease. Electronically Signed   By: Kristine Garbe M.D.   On: 11/02/2016 20:52   Ct Head Wo  Contrast  Result Date: 11/02/2016 CLINICAL DATA:  Generalized weakness.  Difficulty walking EXAM: CT HEAD WITHOUT CONTRAST TECHNIQUE: Contiguous axial  images were obtained from the base of the skull through the vertex without intravenous contrast. COMPARISON:  None. FINDINGS: Brain: No acute intracranial hemorrhage. No focal mass lesion. No midline shift or mass effect. No hydrocephalus. Basilar cisterns are patent. There is hypodense region within the LEFT cerebellum adjacent the cerebral peduncle (image 11, series 3). This lesion measures approximately 1.7 x 1.4 cm. Vascular: No hyperdense vessel or unexpected calcification. Skull: Normal. Negative for fracture or focal lesion. Sinuses/Orbits: No acute finding. Other: None. IMPRESSION: Concern for LEFT cerebellar infarction.  Recommend brain MRI. These results will be called to the ordering clinician or representative by the Radiologist Assistant, and communication documented in the PACS or zVision Dashboard. Electronically Signed   By: Suzy Bouchard M.D.   On: 11/02/2016 21:51   Mr Brain Wo Contrast  Result Date: 11/03/2016 CLINICAL DATA:  Initial evaluation for acute dizziness. EXAM: MRI HEAD WITHOUT CONTRAST MRA HEAD WITHOUT CONTRAST TECHNIQUE: Multiplanar, multiecho pulse sequences of the brain and surrounding structures were obtained without intravenous contrast. Angiographic images of the head were obtained using MRA technique without contrast. COMPARISON:  Prior CT from 11/02/2016. FINDINGS: MRI HEAD FINDINGS Brain: Cerebral volume within normal limits. Few scatter remote lacunar infarcts noted within the left basal ganglia/ corona radiata. Additional remote lacunar infarct present within the left thalamus. Patchy restricted diffusion within the superior left cerebellar hemisphere, consistent with acute ischemic infarct. Additional patchy infarct within the left aspect of the midbrain/ pons (series 5, image 20). No associated hemorrhage or mass  effect. Gray-white matter differentiation otherwise maintained. No other evidence for acute or subacute ischemia. Few punctate chronic micro hemorrhages noted within the left centrum semi ovale. These may be hypertensive in nature. No mass lesion, midline shift or mass effect. Ventricles normal size without evidence for hydrocephalus. No extra-axial fluid collection. Major dural sinuses are grossly patent. Pituitary gland of normal size. Tiny 3 mm T1 hyperintense lesion within the central aspect of the pituitary gland noted, indeterminate. Midline structures intact and normal. Vascular: Major intracranial vascular flow voids are maintained. Skull and upper cervical spine: Craniocervical junction normal. Visualized upper cervical spine unremarkable. Bone marrow signal intensity within normal limits. No scalp soft tissue abnormality. Sinuses/Orbits: Globes and orbital soft tissues within normal limits. Paranasal sinuses are clear. No mastoid effusion. Inner ear structures normal. Other: None. MRA HEAD FINDINGS ANTERIOR CIRCULATION: Distal cervical segments of the internal carotid arteries are patent with antegrade flow. Petrous segments patent bilaterally without stenosis. Multifocal atheromatous irregularity present throughout the carotid siphons with moderate multifocal irregular narrowing. ICA termini patent. Left A1 segment dominant. Right A1 segment hypoplastic and/ or absent. Atheromatous irregularity present throughout the anterior cerebral arteries without high-grade flow-limiting stenosis. ACA is are patent to their distal aspects. M1 segments patent without high-grade stenosis or occlusion. No proximal M2 occlusion. Distal small vessel atheromatous irregularity throughout the MCA branches bilaterally. POSTERIOR CIRCULATION: Vertebral arteries code dominant and widely patent to the vertebrobasilar junction. Right PICA patent proximally. Left PICA not visualized. Basilar artery widely patent. Right SCA  irregular but patent to its distal aspect. Severe stenosis present within the proximal left SCA (series 455). Flow distally is severely attenuated. PCAs arise from the basilar artery. Mild to moderate multifocal atheromatous irregularity involving the PCAs bilaterally without high-grade flow-limiting stenosis. PCAs are patent to their distal aspects. No aneurysm or vascular malformation. IMPRESSION: MRI HEAD IMPRESSION: 1. Patchy multifocal acute ischemic infarcts involving the superior left cerebellar hemisphere and left midbrain/pons. No associated hemorrhage or mass effect. 2.  Scattered remote lacunar infarcts involving the bilateral basal ganglia/corona radiata as well as the left thalamus. 3. 3 mm pituitary lesion, indeterminate. Finding could be further assessed with nonemergent pituitary mass protocol MRI. MRA HEAD IMPRESSION: 1. Negative MRA for large or proximal arterial branch occlusion. 2. Severe proximal left SCA stenosis with attenuated flow distally. No other high-grade or correctable stenosis identified. 3. Additional moderate multifocal atheromatous irregularity involving the anterior and posterior circulation as above. Electronically Signed   By: Jeannine Boga M.D.   On: 11/03/2016 01:50   Dg Foot Complete Right  Result Date: 11/02/2016 CLINICAL DATA:  38 y/o  M; stepped on a roofing nail. EXAM: RIGHT FOOT COMPLETE - 3+ VIEW COMPARISON:  None. FINDINGS: No acute fracture or dislocation. Second digit amputation absent phalanges. Lisfranc alignment is maintained. No articular abnormality is evident. IMPRESSION: No acute bony or articular abnormality identified. Electronically Signed   By: Kristine Garbe M.D.   On: 11/02/2016 20:54   Mr Jodene Nam Head/brain SW Cm  Result Date: 11/03/2016 CLINICAL DATA:  Initial evaluation for acute dizziness. EXAM: MRI HEAD WITHOUT CONTRAST MRA HEAD WITHOUT CONTRAST TECHNIQUE: Multiplanar, multiecho pulse sequences of the brain and surrounding  structures were obtained without intravenous contrast. Angiographic images of the head were obtained using MRA technique without contrast. COMPARISON:  Prior CT from 11/02/2016. FINDINGS: MRI HEAD FINDINGS Brain: Cerebral volume within normal limits. Few scatter remote lacunar infarcts noted within the left basal ganglia/ corona radiata. Additional remote lacunar infarct present within the left thalamus. Patchy restricted diffusion within the superior left cerebellar hemisphere, consistent with acute ischemic infarct. Additional patchy infarct within the left aspect of the midbrain/ pons (series 5, image 20). No associated hemorrhage or mass effect. Gray-white matter differentiation otherwise maintained. No other evidence for acute or subacute ischemia. Few punctate chronic micro hemorrhages noted within the left centrum semi ovale. These may be hypertensive in nature. No mass lesion, midline shift or mass effect. Ventricles normal size without evidence for hydrocephalus. No extra-axial fluid collection. Major dural sinuses are grossly patent. Pituitary gland of normal size. Tiny 3 mm T1 hyperintense lesion within the central aspect of the pituitary gland noted, indeterminate. Midline structures intact and normal. Vascular: Major intracranial vascular flow voids are maintained. Skull and upper cervical spine: Craniocervical junction normal. Visualized upper cervical spine unremarkable. Bone marrow signal intensity within normal limits. No scalp soft tissue abnormality. Sinuses/Orbits: Globes and orbital soft tissues within normal limits. Paranasal sinuses are clear. No mastoid effusion. Inner ear structures normal. Other: None. MRA HEAD FINDINGS ANTERIOR CIRCULATION: Distal cervical segments of the internal carotid arteries are patent with antegrade flow. Petrous segments patent bilaterally without stenosis. Multifocal atheromatous irregularity present throughout the carotid siphons with moderate multifocal  irregular narrowing. ICA termini patent. Left A1 segment dominant. Right A1 segment hypoplastic and/ or absent. Atheromatous irregularity present throughout the anterior cerebral arteries without high-grade flow-limiting stenosis. ACA is are patent to their distal aspects. M1 segments patent without high-grade stenosis or occlusion. No proximal M2 occlusion. Distal small vessel atheromatous irregularity throughout the MCA branches bilaterally. POSTERIOR CIRCULATION: Vertebral arteries code dominant and widely patent to the vertebrobasilar junction. Right PICA patent proximally. Left PICA not visualized. Basilar artery widely patent. Right SCA irregular but patent to its distal aspect. Severe stenosis present within the proximal left SCA (series 455). Flow distally is severely attenuated. PCAs arise from the basilar artery. Mild to moderate multifocal atheromatous irregularity involving the PCAs bilaterally without high-grade flow-limiting stenosis. PCAs are patent to their  distal aspects. No aneurysm or vascular malformation. IMPRESSION: MRI HEAD IMPRESSION: 1. Patchy multifocal acute ischemic infarcts involving the superior left cerebellar hemisphere and left midbrain/pons. No associated hemorrhage or mass effect. 2. Scattered remote lacunar infarcts involving the bilateral basal ganglia/corona radiata as well as the left thalamus. 3. 3 mm pituitary lesion, indeterminate. Finding could be further assessed with nonemergent pituitary mass protocol MRI. MRA HEAD IMPRESSION: 1. Negative MRA for large or proximal arterial branch occlusion. 2. Severe proximal left SCA stenosis with attenuated flow distally. No other high-grade or correctable stenosis identified. 3. Additional moderate multifocal atheromatous irregularity involving the anterior and posterior circulation as above. Electronically Signed   By: Jeannine Boga M.D.   On: 11/03/2016 01:50       Medical Problem List and Plan: 1.  Left hemiataxia and  gait disturbance secondary to left cerebellar, midbrain and pontine infarct  -admit to inpatient rehab 2.  DVT Prophylaxis/Anticoagulation: Subcutaneous heparin. Monitor platelet counts and any signs of bleeding 3. Pain Management/chronic back pain: Oxycodone as needed 4. Mood: Provide emotional support 5. Neuropsych: This patient is capable of making decisions on his own behalf. 6. Skin/Wound Care: Routine skin checks 7. Fluids/Electrolytes/Nutrition: Routine I&O with follow-up chemistries 8. Diabetes mellitus and peripheral neuropathy. Hemoglobin A1c 8.6. Lantus insulin 10 units daily at bedtime. Glucotrol 10 mg daily Check CBGs before meals and at bedtime. Diabetic teaching 9. Hypertension with history of right bundle branch block. Clonidine 0.3 mg 3 times a day, Cardura 2 mg twice a day, Lasix 40 mg daily, hydralazine 100 mg 3 times a day, Cozaar 50 mg daily. Monitor with increased mobility 10. Chronic diastolic congestive heart failure. Continue Lasix. Monitor for any signs of fluid overload 11. CKD stage III. Follow-up chemistries serially 12. Hypokalemia. Follow-up chemistries 13. Tobacco abuse. Counseling 14.. Constipation. Laxative assistance  Post Admission Physician Evaluation: 1. Functional deficits secondary  to left cerebellar, midbrain and pontine infarcts. 2. Patient is admitted to receive collaborative, interdisciplinary care between the physiatrist, rehab nursing staff, and therapy team. 3. Patient's level of medical complexity and substantial therapy needs in context of that medical necessity cannot be provided at a lesser intensity of care such as a SNF. 4. Patient has experienced substantial functional loss from his/her baseline which was documented above under the "Functional History" and "Functional Status" headings.  Judging by the patient's diagnosis, physical exam, and functional history, the patient has potential for functional progress which will result in measurable  gains while on inpatient rehab.  These gains will be of substantial and practical use upon discharge  in facilitating mobility and self-care at the household level. 5. Physiatrist will provide 24 hour management of medical needs as well as oversight of the therapy plan/treatment and provide guidance as appropriate regarding the interaction of the two. 6. The Preadmission Screening has been reviewed and patient status is unchanged unless otherwise stated above. 7. 24 hour rehab nursing will assist with bladder management, bowel management, safety, skin/wound care, disease management, medication administration, pain management and patient education  and help integrate therapy concepts, techniques,education, etc. 8. PT will assess and treat for/with: Lower extremity strength, range of motion, stamina, balance, functional mobility, safety, adaptive techniques and equipment, NMR, family and patient education, community re-entry, vestibular rx   Goals are: mod I. 9. OT will assess and treat for/with: ADL's, functional mobility, safety, upper extremity strength, adaptive techniques and equipment, NMR, family and patient education.   Goals are: mod I. Therapy may proceed with showering this patient.  10. SLP will assess and treat for/with: n/a.  Goals are: n/a. 11. Case Management and Social Worker will assess and treat for psychological issues and discharge planning. 12. Team conference will be held weekly to assess progress toward goals and to determine barriers to discharge. 13. Patient will receive at least 3 hours of therapy per day at least 5 days per week. 14. ELOS: 8-11 days       15. Prognosis:  excellent     Meredith Staggers, MD, [facility] 11/04/2016  Cathlyn Parsons., PA-C 11/04/2016

## 2016-11-04 NOTE — Progress Notes (Signed)
Occupational Therapy Treatment Patient Details Name: John Jacobson MRN: [id number] DOB: 08-Jul-1955 Today's Date: 11/04/2016    History of present illness Pt presented to ED with dizziness, difficulty walking, and difficulty typing with lt hand. MRI showed patchy multifocal acute ischemic infarcts involving the superior left cerebellar hemisphere and left midbrain/pons. Also showed scattered remote lacunar infarcts involving the bilateral basal ganglia/corona radiata as well as the left thalamus. PMH -  insulin-dependent diabetes mellitus, hypertension, chronic low back pain, chronic kidney disease stage III, and chronic diastolic CHF   OT comments  Pt with decreased awareness of deficits and safety, educated to sit to shower and for donning and doffing underwear. Pt progressing well.  Follow Up Recommendations  CIR    Equipment Recommendations  3 in 1 bedside commode;Tub/shower bench    Recommendations for Other Services      Precautions / Restrictions Precautions Precautions: Fall Restrictions Weight Bearing Restrictions: No       Mobility Bed Mobility                  Transfers Overall transfer level: Needs assistance Equipment used: Rolling walker (2 wheeled) Transfers: Sit to/from Stand Sit to Stand: Supervision         General transfer comment: cues for safety and hand placment with RW    Balance     Sitting balance-Leahy Scale: Good       Standing balance-Leahy Scale: Fair                             ADL either performed or assessed with clinical judgement   ADL Overall ADL's : Needs assistance/impaired     Grooming: Supervision/safety;Standing;Wash/dry hands;Oral care       Lower Body Bathing: Minimal assistance Lower Body Bathing Details (indicate cue type and reason): assisted drying feet in standing prior to walking Upper Body Dressing : Set up;Sitting   Lower Body Dressing: Set up;Sitting/lateral leans           Tub/  Shower Transfer: Min guard;Ambulation;Grab bars   Functional mobility during ADLs: Min guard;Rolling walker General ADL Comments: pt standing in shower upon OTs arrival     Vision       Perception     Praxis      Cognition Arousal/Alertness: Awake/alert Behavior During Therapy: WFL for tasks assessed/performed Overall Cognitive Status: Impaired/Different from baseline Area of Impairment: Safety/judgement                         Safety/Judgement: Decreased awareness of safety;Decreased awareness of deficits              Exercises     Shoulder Instructions       General Comments      Pertinent Vitals/ Pain       Pain Assessment: No/denies pain  Home Living                                          Prior Functioning/Environment              Frequency  Min 3X/week        Progress Toward Goals  OT Goals(current goals can now be found in the care plan section)  Progress towards OT goals: Progressing toward goals  Acute Rehab OT Goals Patient Stated Goal: return to  prior level OT Goal Formulation: With patient Time For Goal Achievement: 11/17/16 Potential to Achieve Goals: Good  Plan Discharge plan remains appropriate    Co-evaluation                 AM-PAC PT "6 Clicks" Daily Activity     Outcome Measure   Help from another person eating meals?: None Help from another person taking care of personal grooming?: A Little Help from another person toileting, which includes using toliet, bedpan, or urinal?: A Little Help from another person bathing (including washing, rinsing, drying)?: A Little Help from another person to put on and taking off regular upper body clothing?: None Help from another person to put on and taking off regular lower body clothing?: A Little 6 Click Score: 20    End of Session Equipment Utilized During Treatment: Gait belt;Rolling walker  OT Visit Diagnosis: Unsteadiness on feet  (R26.81)   Activity Tolerance Patient tolerated treatment well   Patient Left in chair;with call bell/phone within reach;with chair alarm set;with family/visitor present   Nurse Communication          Time: 8864-8472 OT Time Calculation (min): 30 min  Charges: OT General Charges $OT Visit: 1 Procedure OT Treatments $Self Care/Home Management : 23-37 mins     Malka So 11/04/2016, 11:55 AM  [phone]

## 2016-11-04 NOTE — Progress Notes (Signed)
Gunnar Fusi Rehab Admission Coordinator Signed Physical Medicine and Rehabilitation  PMR Pre-admission Date of Service: 11/04/2016 1:40 PM  Related encounter: ED to Hosp-Admission (Current) from 11/02/2016 in [facility] [place]       [] Hide copied text PMR Admission Coordinator Pre-Admission Assessment  Patient: John Jacobson is an 61 y.o., male MRN: [id number] DOB: December 05, 1955 Height: 5\' 7"  (170.2 cm) Weight: 80.1 kg (176 lb 9.6 oz)                                                                                                                                                  Insurance Information HMO:     PPO:      PCP:      IPA:      80/20:      OTHER:  PRIMARY: Medicaid [place] Access       Policy#: [id number]      Subscriber: Self CM Name:       Phone#:      Fax#:  Pre-Cert#: Coverage code: [place]      Employer: Benefits:  Phone #: [phone]     Name: verified via automated system           Eff. Date: Eligible as of 11/04/16     Deduct:       Out of Pocket Max:       Life Max:  CIR:       SNF:  Outpatient:      Co-Pay:  Home Health:       Co-Pay:  DME:      Co-Pay:  Providers:   Medicaid Application Date:       Case Manager:  Disability Application Date:       Case Worker:   Emergency Contact Information        Contact Information    Name Relation Home Work Mobile   Belger,[name] Daughter   [phone]   Manuel,Latecia Daughter   [phone]     Current Medical History  Patient Admitting Diagnosis: Left cerebellar, midbrain and pontine infarct causing left hemi-ataxia and gait disturbance.  History of Present Illness: John A Smithis a 61 y.o.right handed malewith history of asthma, diabetes mellitus, hypertension, right bundle branch block, tobacco abuse, CKD stage III, chronic diastolic congestive heart failure. 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 One level home. Brother recently had CABG. Presented 11/02/2016 with dizziness as well as gait disturbance and left-sided numbness over the past 4 days as well as reported fall. Patient also reported that he had stepped on a nail with his right foot. CT/MRI showed patchy multifocal acute ischemic infarcts involving the superior left cerebellar hemisphere and left midbrain/pons. No associated hemorrhage or mass effect. Scattered remote lacunar infarcts involving the bilateral basal ganglia corona radiata  as well as left thalamus. 3 mm pituitary lesion indeterminate. MRA negative for large vessel occlusion. Patient did not receive TPA. Echocardiogram with ejection fraction 65% and grade 1 diastolic dysfunction. Neurology consulted presently on Plavix for CVA prophylaxis. Tolerating a regular diet. Subcutaneous heparin added for DVT prophylaxis. Physical and occupationaltherapy evaluation completed 11/03/2016 with recommendations of physical medicine rehabilitation consult.Patient was admitted for a comprehensive rehabilitation program 11/04/16.  NIH Total: 0  Past Medical History      Past Medical History:  Diagnosis Date  . Asthma   . DM (diabetes mellitus) ([facility])   . HTN (hypertension)   . RBBB (right bundle branch block)     Family History  family history includes Coronary artery disease in his mother.  Prior Rehab/Hospitalizations:  Has the patient had major surgery during 100 days prior to admission? No  Current Medications   Current Facility-Administered Medications:  .  acetaminophen (TYLENOL) tablet 650 mg, 650 mg, Oral, Q4H PRN **OR** acetaminophen (TYLENOL) solution 650 mg, 650 mg, Per Tube, Q4H PRN **OR** acetaminophen (TYLENOL) suppository 650 mg, 650 mg, Rectal, Q4H PRN, Opyd, Ilene Qua, MD .  aspirin EC tablet 325 mg, 325 mg, Oral, Daily, Rosalin Hawking, MD, 325 mg at 11/04/16 0901 .  atorvastatin (LIPITOR) tablet 20 mg, 20 mg, Oral, q1800,  Vann, Jessica U, DO, 20 mg at 11/03/16 1751 .  cholecalciferol (VITAMIN D) tablet 2,000 Units, 2,000 Units, Oral, Daily, Abrol, Nayana, MD .  cloNIDine (CATAPRES) tablet 0.3 mg, 0.3 mg, Oral, TID, Vann, Jessica U, DO, 0.3 mg at 11/04/16 0901 .  clopidogrel (PLAVIX) tablet 75 mg, 75 mg, Oral, Daily, Eulogio Bear U, DO, 75 mg at 11/04/16 0902 .  doxazosin (CARDURA) tablet 2 mg, 2 mg, Oral, BID, Vann, Jessica U, DO, 2 mg at 11/04/16 0901 .  furosemide (LASIX) tablet 40 mg, 40 mg, Oral, Daily, Vann, Jessica U, DO, 40 mg at 11/04/16 0901 .  glipiZIDE (GLUCOTROL) tablet 10 mg, 10 mg, Oral, QAC breakfast, Vann, Jessica U, DO, 10 mg at 11/04/16 0900 .  heparin injection 5,000 Units, 5,000 Units, Subcutaneous, Q8H, Opyd, Ilene Qua, MD, 5,000 Units at 11/04/16 [phone] .  hydrALAZINE (APRESOLINE) tablet 100 mg, 100 mg, Oral, TID, Vann, Jessica U, DO, 100 mg at 11/04/16 0902 .  hydrocerin (EUCERIN) cream 1 application, 1 application, Topical, Daily, Opyd, Ilene Qua, MD, 1 application at 22/97/98 0902 .  insulin aspart (novoLOG) injection 0-15 Units, 0-15 Units, Subcutaneous, TID WC, Opyd, Ilene Qua, MD, 2 Units at 11/04/16 0900 .  insulin aspart (novoLOG) injection 0-5 Units, 0-5 Units, Subcutaneous, QHS, Opyd, Ilene Qua, MD, 2 Units at 11/03/16 0123 .  insulin aspart (novoLOG) injection 3 Units, 3 Units, Subcutaneous, TID WC, Opyd, Ilene Qua, MD, 3 Units at 11/04/16 0859 .  insulin glargine (LANTUS) injection 10 Units, 10 Units, Subcutaneous, QHS, Opyd, Timothy S, MD, 10 Units at 11/03/16 2200 .  labetalol (NORMODYNE,TRANDATE) injection 10 mg, 10 mg, Intravenous, Q2H PRN, Opyd, Ilene Qua, MD, 10 mg at 11/03/16 0206 .  losartan (COZAAR) tablet 50 mg, 50 mg, Oral, Daily, Vann, Jessica U, DO, 50 mg at 11/04/16 0902 .  oxyCODONE (Oxy IR/ROXICODONE) immediate release tablet 5-10 mg, 5-10 mg, Oral, Q4H PRN, Opyd, Ilene Qua, MD, 10 mg at 11/03/16 1613 .  polyethylene glycol (MIRALAX / GLYCOLAX) packet 17 g, 17 g, Oral,  Daily, Opyd, Ilene Qua, MD, 17 g at 11/04/16 0900 .  polyvinyl alcohol (LIQUIFILM TEARS) 1.4 % ophthalmic solution 1 drop, 1 drop, Both Eyes, PRN,  Reyne Dumas, MD, 1 drop at 11/04/16 1007 .  potassium chloride SA (K-DUR,KLOR-CON) CR tablet 40 mEq, 40 mEq, Oral, Once, Abrol, Nayana, MD .  saccharomyces boulardii (FLORASTOR) capsule 250 mg, 250 mg, Oral, BID, Opyd, Ilene Qua, MD, 250 mg at 11/04/16 0902 .  senna (SENOKOT) tablet 8.6 mg, 1 tablet, Oral, Daily, Opyd, Timothy S, MD, 8.6 mg at 11/04/16 0900  Patients Current Diet: Diet heart healthy/carb modified Room service appropriate? Yes; Fluid consistency: Thin Diet - low sodium heart healthy  Precautions / Restrictions Precautions Precautions: Fall Restrictions Weight Bearing Restrictions: No   Has the patient had 2 or more falls or a fall with injury in the past year?Yes, which he reports were a result of this CVA with no injuries   Prior Activity Level Limited Community (1-2x/wk): Patient is a Industrial/product designer, who works part-time as an IT sales professional.  He works from home and went out into the community a couple times a week. He lived with his brother and has two daughters, but was fully independent prior to this admission and driving.    Home Assistive Devices / Equipment Home Assistive Devices/Equipment: CBG Meter Home Equipment: None  Prior Device Use: Indicate devices/aids used by the patient prior to current illness, exacerbation or injury? None of the above  Prior Functional Level Prior Function Level of Independence: Independent  Self Care: Did the patient need help bathing, dressing, using the toilet or eating? Independent  Indoor Mobility: Did the patient need assistance with walking from room to room (with or without device)? Independent  Stairs: Did the patient need assistance with internal or external stairs (with or without device)? Independent  Functional Cognition: Did the patient need help  planning regular tasks such as shopping or remembering to take medications? Independent  Current Functional Level Cognition  Arousal/Alertness: Awake/alert Overall Cognitive Status: Impaired/Different from baseline Orientation Level: Oriented X4 Safety/Judgement: Decreased awareness of safety, Decreased awareness of deficits Attention: Selective Selective Attention: Appears intact Safety/Judgment: Appears intact    Extremity Assessment (includes Sensation/Coordination)  Upper Extremity Assessment: LUE deficits/detail LUE Sensation: decreased light touch (ulnar side of hand x 2 months) LUE Coordination: decreased fine motor  Lower Extremity Assessment: Defer to PT evaluation RLE Coordination: decreased gross motor LLE Coordination: decreased gross motor    ADLs  Overall ADL's : Needs assistance/impaired Eating/Feeding: Independent, Sitting Grooming: Supervision/safety, Standing, Wash/dry hands, Oral care Upper Body Bathing: Set up, Sitting Lower Body Bathing: Minimal assistance Lower Body Bathing Details (indicate cue type and reason): assisted drying feet in standing prior to walking Upper Body Dressing : Set up, Sitting Lower Body Dressing: Set up, Sitting/lateral leans Lower Body Dressing Details (indicate cue type and reason): able to don and doff socks Toilet Transfer: Minimal assistance, RW, Ambulation Toileting- Clothing Manipulation and Hygiene: Minimal assistance, Sit to/from stand Tub/ Shower Transfer: Min guard, Ambulation, Grab bars Functional mobility during ADLs: Min guard, Rolling walker General ADL Comments: pt standing in shower upon OTs arrival    Mobility  Overal bed mobility: Needs Assistance Bed Mobility: Supine to Sit Supine to sit: Supervision, HOB elevated General bed mobility comments: pt in chair    Transfers  Overall transfer level: Needs assistance Equipment used: Rolling walker (2 wheeled) Transfers: Sit to/from Stand Sit to Stand:  Supervision General transfer comment: cues for safety and hand placment with RW    Ambulation / Gait / Stairs / Wheelchair Mobility  Ambulation/Gait Ambulation/Gait assistance: Mod assist Ambulation Distance (Feet): 90 Feet Assistive device: 1 person  hand held assist Gait Pattern/deviations: Step-through pattern, Decreased stride length, Ataxic, Drifts right/left General Gait Details: Assist for balance and support Gait velocity: decr Gait velocity interpretation: Below normal speed for age/gender    Posture / Balance Dynamic Sitting Balance Sitting balance - Comments: no LOB with donning socks Balance Overall balance assessment: Needs assistance Sitting-balance support: No upper extremity supported Sitting balance-Leahy Scale: Good Sitting balance - Comments: no LOB with donning socks Standing balance support: No upper extremity supported Standing balance-Leahy Scale: Fair Standing balance comment: requires min guard to min assist at all times    Special needs/care consideration BiPAP/CPAP: No CPM: No Continuous Drip IV: No Dialysis: No Life Vest: No Oxygen: No Special Bed: No Trach Size: No Wound Vac (area): No       Skin: Closed dark spots being watched on toes of right lower extremity, 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                        Bowel mgmt: 10/31/16 Bladder mgmt: Continent  Diabetic mgmt: HgbA1c- 8.6, which he managed with oral meds in the AM and lantis injections in the PM     Previous Home Environment Living Arrangements: Other relatives (brother) Available Help at Discharge: Family, Available PRN/intermittently Type of Home: House Home Layout: One level Home Access: Level entry Bathroom Shower/Tub: Chiropodist: [place]: No  Discharge Living Setting Plans for Discharge Living Setting: Patient's home, Lives with (comment) (brother but he is recovering from CABG) Type of Home at Discharge:  House Discharge Home Layout: One level Discharge Home Access: Level entry Discharge Bathroom Shower/Tub: Tub/shower unit Discharge Bathroom Toilet: Standard Discharge Bathroom Accessibility: Yes How Accessible: Accessible via walker Does the patient have any problems obtaining your medications?: No  Social/Family/Support Systems Patient Roles: Parent, Other (Comment) (sibling) Contact Information: Daughter: Reynard Christoffersen [phone] Daughter: Karle Plumber [phone] Anticipated Caregiver: Babs Sciara is local to assist PRN Anticipated Caregiver's Contact Information: see above  Caregiver Availability: Intermittent Discharge Plan Discussed with Primary Caregiver: Yes (discussed with patient ) Is Caregiver In Agreement with Plan?: Yes (patient is ) Does Caregiver/Family have Issues with Lodging/Transportation while Pt is in Rehab?: No  Goals/Additional Needs Patient/Family Goal for Rehab: PT/OT Mod I -Supervision  Expected length of stay: 9-12 days Cultural Considerations: None Dietary Needs: Carb. Mod. 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  Big and third/now second toes being watched  Information Needs to be Provided By: Team FYI   Decrease burden of Care through IP rehab admission: No   Possible need for SNF placement upon discharge: No  Patient Condition: This patient's condition remains as documented in the consult dated 11/03/16, in which the Rehabilitation Physician determined and documented that the patient's condition is appropriate for intensive rehabilitative care in an inpatient rehabilitation  facility. Will admit to inpatient rehab today.  Preadmission Screen Completed By:  Gunnar Fusi, 11/04/2016 1:51 PM ______________________________________________________________________   Discussed status with Dr. Naaman Plummer on 11/04/16 at 1350 and received telephone approval for admission today.  Admission Coordinator:  Gunnar Fusi, time 1350/Date 11/04/16       Cosigned by: Meredith Staggers, MD at  11/04/2016 2:19 PM

## 2016-11-04 NOTE — PMR Pre-admission (Signed)
PMR Admission Coordinator Pre-Admission Assessment  Patient: John Jacobson is an 61 y.o., male MRN: [id number] DOB: 11-14-1955 Height: 5\' 7"  (170.2 cm) Weight: 80.1 kg (176 lb 9.6 oz)              Insurance Information HMO:     PPO:      PCP:      IPA:      80/20:      OTHER:  PRIMARY: Medicaid [place] Access       Policy#: [id number]      Subscriber: Self CM Name:       Phone#:      Fax#:  Pre-Cert#: Coverage code: [place]      Employer: Benefits:  Phone #: [phone]     Name: verified via automated system   Eff. 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  History of Present Illness:  John A Smithis a 61 y.o.right handed malewith history of asthma, diabetes mellitus, hypertension, right bundle branch block, tobacco abuse, CKD stage III, chronic diastolic congestive heart failure. 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 One level home. Brother recently had CABG. Presented 11/02/2016 with dizziness as well as gait disturbance and left-sided numbness over the past 4 days as well as reported fall. Patient also reported that he had stepped on a nail with his right foot. CT/MRI showed patchy multifocal acute ischemic infarcts involving the superior left cerebellar hemisphere and left midbrain/pons. No associated hemorrhage or mass effect.  Scattered remote lacunar infarcts involving the bilateral basal ganglia corona radiata as well as left thalamus. 3 mm pituitary lesion indeterminate. MRA negative for large vessel occlusion. Patient did not receive TPA. Echocardiogram with ejection fraction 65% and grade 1 diastolic dysfunction. Neurology consulted presently on Plavix for CVA prophylaxis. Tolerating a regular diet. Subcutaneous heparin added for DVT prophylaxis. Physical and occupational therapy evaluation completed 11/03/2016 with recommendations of physical medicine rehabilitation consult. Patient was admitted for a comprehensive rehabilitation program 11/04/16.  NIH Total: 0    Past Medical History  Past Medical History:  Diagnosis Date  . Asthma   . DM (diabetes mellitus) ([place])   . HTN (hypertension)   . RBBB (right bundle branch block)     Family History  family history includes Coronary artery disease in his mother.  Prior Rehab/Hospitalizations:  Has the patient had major surgery during 100 days prior to admission? No  Current Medications   Current Facility-Administered Medications:  .  acetaminophen (TYLENOL) tablet 650 mg, 650 mg, Oral, Q4H PRN **OR** acetaminophen (TYLENOL) solution 650 mg, 650 mg, Per Tube, Q4H PRN **OR** acetaminophen (TYLENOL) suppository 650 mg, 650 mg, Rectal, Q4H PRN, Opyd,  Ilene Qua, MD .  aspirin EC tablet 325 mg, 325 mg, Oral, Daily, Rosalin Hawking, MD, 325 mg at 11/04/16 0901 .  atorvastatin (LIPITOR) tablet 20 mg, 20 mg, Oral, q1800, Vann, Jessica U, DO, 20 mg at 11/03/16 1751 .  cholecalciferol (VITAMIN D) tablet 2,000 Units, 2,000 Units, Oral, Daily, Abrol, Nayana, MD .  cloNIDine (CATAPRES) tablet 0.3 mg, 0.3 mg, Oral, TID, Vann, Jessica U, DO, 0.3 mg at 11/04/16 0901 .  clopidogrel (PLAVIX) tablet 75 mg, 75 mg, Oral, Daily, Eulogio Bear U, DO, 75 mg at 11/04/16 0902 .  doxazosin (CARDURA) tablet 2 mg, 2 mg, Oral, BID, Vann, Jessica U, DO, 2 mg at 11/04/16 0901 .  furosemide (LASIX)  tablet 40 mg, 40 mg, Oral, Daily, Vann, Jessica U, DO, 40 mg at 11/04/16 0901 .  glipiZIDE (GLUCOTROL) tablet 10 mg, 10 mg, Oral, QAC breakfast, Vann, Jessica U, DO, 10 mg at 11/04/16 0900 .  heparin injection 5,000 Units, 5,000 Units, Subcutaneous, Q8H, Opyd, Ilene Qua, MD, 5,000 Units at 11/04/16 [phone] .  hydrALAZINE (APRESOLINE) tablet 100 mg, 100 mg, Oral, TID, Vann, Jessica U, DO, 100 mg at 11/04/16 0902 .  hydrocerin (EUCERIN) cream 1 application, 1 application, Topical, Daily, Opyd, Ilene Qua, MD, 1 application at 47/09/[birth year] 0902 .  insulin aspart (novoLOG) injection 0-15 Units, 0-15 Units, Subcutaneous, TID WC, Opyd, Ilene Qua, MD, 2 Units at 11/04/16 0900 .  insulin aspart (novoLOG) injection 0-5 Units, 0-5 Units, Subcutaneous, QHS, Opyd, Ilene Qua, MD, 2 Units at 11/03/16 0123 .  insulin aspart (novoLOG) injection 3 Units, 3 Units, Subcutaneous, TID WC, Opyd, Ilene Qua, MD, 3 Units at 11/04/16 0859 .  insulin glargine (LANTUS) injection 10 Units, 10 Units, Subcutaneous, QHS, Opyd, Timothy S, MD, 10 Units at 11/03/16 2200 .  labetalol (NORMODYNE,TRANDATE) injection 10 mg, 10 mg, Intravenous, Q2H PRN, Opyd, Ilene Qua, MD, 10 mg at 11/03/16 0206 .  losartan (COZAAR) tablet 50 mg, 50 mg, Oral, Daily, Vann, Jessica U, DO, 50 mg at 11/04/16 0902 .  oxyCODONE (Oxy IR/ROXICODONE) immediate release tablet 5-10 mg, 5-10 mg, Oral, Q4H PRN, Opyd, Ilene Qua, MD, 10 mg at 11/03/16 1613 .  polyethylene glycol (MIRALAX / GLYCOLAX) packet 17 g, 17 g, Oral, Daily, Opyd, Ilene Qua, MD, 17 g at 11/04/16 0900 .  polyvinyl alcohol (LIQUIFILM TEARS) 1.4 % ophthalmic solution 1 drop, 1 drop, Both Eyes, PRN, Abrol, Nayana, MD, 1 drop at 11/04/16 1007 .  potassium chloride SA (K-DUR,KLOR-CON) CR tablet 40 mEq, 40 mEq, Oral, Once, Abrol, Nayana, MD .  saccharomyces boulardii (FLORASTOR) capsule 250 mg, 250 mg, Oral, BID, Opyd, Ilene Qua, MD, 250 mg at 11/04/16 0902 .  senna (SENOKOT) tablet 8.6 mg, 1 tablet, Oral,  Daily, Opyd, Timothy S, MD, 8.6 mg at 11/04/16 0900  Patients Current Diet: Diet heart healthy/carb modified Room service appropriate? Yes; Fluid consistency: Thin Diet - low sodium heart healthy  Precautions / Restrictions Precautions Precautions: Fall Restrictions Weight Bearing Restrictions: No   Has the patient had 2 or more falls or a fall with injury in the past year?Yes, which he reports were a result of this CVA with no injuries   Prior Activity Level Limited Community (1-2x/wk): Patient is a Industrial/product designer, who works part-time as an IT sales professional.  He works from home and went out into the community a couple times a week. He lived with his brother and has two daughters, but was fully independent prior to this admission and driving.  Home Assistive Devices / Equipment Home Assistive Devices/Equipment: CBG Meter Home Equipment: None  Prior Device Use: Indicate devices/aids used by the patient prior to current illness, exacerbation or injury? None of the above  Prior Functional Level Prior Function Level of Independence: Independent  Self Care: Did the patient need help bathing, dressing, using the toilet or eating? Independent  Indoor Mobility: Did the patient need assistance with walking from room to room (with or without device)? Independent  Stairs: Did the patient need assistance with internal or external stairs (with or without device)? Independent  Functional Cognition: Did the patient need help planning regular tasks such as shopping or remembering to take medications? Independent  Current Functional Level Cognition  Arousal/Alertness: Awake/alert Overall Cognitive Status: Impaired/Different from baseline Orientation Level: Oriented X4 Safety/Judgement: Decreased awareness of safety, Decreased awareness of deficits Attention: Selective Selective Attention: Appears intact Safety/Judgment: Appears intact    Extremity Assessment (includes  Sensation/Coordination)  Upper Extremity Assessment: LUE deficits/detail LUE Sensation: decreased light touch (ulnar side of hand x 2 months) LUE Coordination: decreased fine motor  Lower Extremity Assessment: Defer to PT evaluation RLE Coordination: decreased gross motor LLE Coordination: decreased gross motor    ADLs  Overall ADL's : Needs assistance/impaired Eating/Feeding: Independent, Sitting Grooming: Supervision/safety, Standing, Wash/dry hands, Oral care Upper Body Bathing: Set up, Sitting Lower Body Bathing: Minimal assistance Lower Body Bathing Details (indicate cue type and reason): assisted drying feet in standing prior to walking Upper Body Dressing : Set up, Sitting Lower Body Dressing: Set up, Sitting/lateral leans Lower Body Dressing Details (indicate cue type and reason): able to don and doff socks Toilet Transfer: Minimal assistance, RW, Ambulation Toileting- Clothing Manipulation and Hygiene: Minimal assistance, Sit to/from stand Tub/ Shower Transfer: Min guard, Ambulation, Grab bars Functional mobility during ADLs: Min guard, Rolling walker General ADL Comments: pt standing in shower upon OTs arrival    Mobility  Overal bed mobility: Needs Assistance Bed Mobility: Supine to Sit Supine to sit: Supervision, HOB elevated General bed mobility comments: pt in chair    Transfers  Overall transfer level: Needs assistance Equipment used: Rolling walker (2 wheeled) Transfers: Sit to/from Stand Sit to Stand: Supervision General transfer comment: cues for safety and hand placment with RW    Ambulation / Gait / Stairs / Wheelchair Mobility  Ambulation/Gait Ambulation/Gait assistance: Mod assist Ambulation Distance (Feet): 90 Feet Assistive device: 1 person hand held assist Gait Pattern/deviations: Step-through pattern, Decreased stride length, Ataxic, Drifts right/left General Gait Details: Assist for balance and support Gait velocity: decr Gait velocity  interpretation: Below normal speed for age/gender    Posture / Balance Dynamic Sitting Balance Sitting balance - Comments: no LOB with donning socks Balance Overall balance assessment: Needs assistance Sitting-balance support: No upper extremity supported Sitting balance-Leahy Scale: Good Sitting balance - Comments: no LOB with donning socks Standing balance support: No upper extremity supported Standing balance-Leahy Scale: Fair Standing balance comment: requires min guard to min assist at all times    Special needs/care consideration BiPAP/CPAP: No CPM: No Continuous Drip IV: No Dialysis: No Life Vest: No Oxygen: No Special Bed: No Trach Size: No Wound Vac (area): No       Skin: Closed dark spots being watched on toes of right lower extremity, 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                        Bowel mgmt: 10/31/16 Bladder mgmt: Continent  Diabetic mgmt: HgbA1c - 8.6,  which he managed with oral meds in the AM and lantis injections in the PM     Previous Home Environment Living Arrangements: Other relatives (brother) Available Help at Discharge: Family, Available PRN/intermittently Type of Home: House Home Layout: One level Home Access: Level entry Bathroom Shower/Tub: Chiropodist: [place]: No  Discharge Living Setting Plans for Discharge Living Setting: Patient's home, Lives with (comment) (brother but he is recovering from CABG) Type of Home at Discharge: House Discharge Home Layout: One level Discharge Home Access: Level entry Discharge Bathroom Shower/Tub: Tub/shower unit Discharge Bathroom Toilet: Standard Discharge Bathroom Accessibility: Yes How Accessible: Accessible via walker Does the patient have any problems obtaining your medications?: No  Social/Family/Support Systems Patient Roles: Parent, Other (Comment) (sibling) Contact Information: Daughter: Florencio Hollibaugh [phone] Daughter: Karle Plumber  [phone] Anticipated Caregiver: Babs Sciara is local to assist PRN Anticipated Caregiver's Contact Information: see above  Caregiver Availability: Intermittent Discharge Plan Discussed with Primary Caregiver: Yes (discussed with patient ) Is Caregiver In Agreement with Plan?: Yes (patient is ) Does Caregiver/Family have Issues with Lodging/Transportation while Pt is in Rehab?: No  Goals/Additional Needs Patient/Family Goal for Rehab: PT/OT Mod I -Supervision  Expected length of stay: 9-12 days Cultural Considerations: None Dietary Needs: Carb. Mod. 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  Big and third/now second toes being watched  Information Needs to be Provided By: Team FYI   Decrease burden of Care through IP rehab admission: No   Possible need for SNF placement upon discharge: No  Patient Condition: This patient's condition remains as documented in the consult dated 11/03/16, in which the Rehabilitation Physician determined and documented that the patient's condition is appropriate for intensive rehabilitative care in an inpatient rehabilitation facility. Will admit to inpatient rehab today.  Preadmission Screen Completed By:  Gunnar Fusi, 11/04/2016 1:51 PM ______________________________________________________________________   Discussed status with Dr. Naaman Plummer on 11/04/16 at 1350 and received telephone approval for admission today.  Admission Coordinator:  Gunnar Fusi, time 1350/Date 11/04/16

## 2016-11-04 NOTE — Progress Notes (Signed)
Patient ID: John Jacobson, male   DOB: 05-Mar-1956, 61 y.o.   MRN: [id number] Patient arrived from 54[facility] with RN, family and patient belongings. Patient and family oriented to room, fall prevention plan, rehab safety plan, health resource notebook, and rehab schedule. Patient and family informed that the bed alarm would be used at night while the patient is sleeping. Patient resting comfortably in bed with family at the bedside.

## 2016-11-04 NOTE — Progress Notes (Signed)
Physical Therapy Treatment Patient Details Name: John Jacobson MRN: [id number] DOB: 1956/06/09 Today's Date: 11/04/2016    History of Present Illness Pt presented to ED with dizziness, difficulty walking, and difficulty typing with lt hand. MRI showed patchy multifocal acute ischemic infarcts involving the superior left cerebellar hemisphere and left midbrain/pons. Also showed scattered remote lacunar infarcts involving the bilateral basal ganglia/corona radiata as well as the left thalamus. PMH -  insulin-dependent diabetes mellitus, hypertension, chronic low back pain, chronic kidney disease stage III, and chronic diastolic CHF    PT Comments    Pt making steady progress.  Worked on challenges to balance, scanning with/with AD in hand, heel/toe pattern, and gait endurance.   Follow Up Recommendations  CIR     Equipment Recommendations  Other (comment) (TBA next venue)    Recommendations for Other Services       Precautions / Restrictions Precautions Precautions: Fall    Mobility  Bed Mobility Overal bed mobility: Needs Assistance Bed Mobility: Supine to Sit;Sit to Supine     Supine to sit: Supervision Sit to supine: Supervision      Transfers Overall transfer level: Needs assistance Equipment used: Rolling walker (2 wheeled);None Transfers: Sit to/from Stand Sit to Stand: Supervision         General transfer comment: cues for safety,   Ambulation/Gait Ambulation/Gait assistance: Min assist Ambulation Distance (Feet): 90 Feet (x2) Assistive device: Rolling walker (2 wheeled) (rail) Gait Pattern/deviations: Step-through pattern Gait velocity: decr Gait velocity interpretation: Below normal speed for age/gender General Gait Details: notably more steady with RW.  Listing and/or drifting Left when using rail or attempting to release rail.  Guarded with assist only and no AD   Stairs            Wheelchair Mobility    Modified Rankin (Stroke Patients  Only) Modified Rankin (Stroke Patients Only) Pre-Morbid Rankin Score: No symptoms Modified Rankin: Moderately severe disability     Balance Overall balance assessment: Needs assistance Sitting-balance support: No upper extremity supported Sitting balance-Leahy Scale: Good     Standing balance support: No upper extremity supported Standing balance-Leahy Scale: Fair Standing balance comment: guard for static standing, min for dynamic tasks                            Cognition Arousal/Alertness: Awake/alert Behavior During Therapy: WFL for tasks assessed/performed Overall Cognitive Status: Impaired/Different from baseline Area of Impairment: Safety/judgement                         Safety/Judgement: Decreased awareness of safety;Decreased awareness of deficits            Exercises      General Comments        Pertinent Vitals/Pain Pain Assessment: No/denies pain    Home Living                      Prior Function            PT Goals (current goals can now be found in the care plan section) Acute Rehab PT Goals Patient Stated Goal: return to prior level PT Goal Formulation: With patient Time For Goal Achievement: 11/17/16 Potential to Achieve Goals: Good Progress towards PT goals: Progressing toward goals    Frequency    Min 4X/week      PT Plan Current plan remains appropriate    Co-evaluation  AM-PAC PT "6 Clicks" Daily Activity  Outcome Measure  Difficulty turning over in bed (including adjusting bedclothes, sheets and blankets)?: A Little Difficulty moving from lying on back to sitting on the side of the bed? : A Little Difficulty sitting down on and standing up from a chair with arms (e.g., wheelchair, bedside commode, etc,.)?: A Little Help needed moving to and from a bed to chair (including a wheelchair)?: A Little Help needed walking in hospital room?: A Little Help needed climbing 3-5 steps  with a railing? : A Lot 6 Click Score: 17    End of Session   Activity Tolerance: Patient tolerated treatment well Patient left: in bed;with call bell/phone within reach;with family/visitor present Nurse Communication: Mobility status PT Visit Diagnosis: Unsteadiness on feet (R26.81);Difficulty in walking, not elsewhere classified (R26.2)     Time: 6283-6629 PT Time Calculation (min) (ACUTE ONLY): 34 min  Charges:  $Gait Training: 8-22 mins $Therapeutic Activity: 8-22 mins                    G Codes:       18-Nov-2016  Donnella Sham, PT [phone]  (pager)   [name] 11-18-16, 4:03 PM

## 2016-11-05 ENCOUNTER — Inpatient Hospital Stay (HOSPITAL_COMMUNITY): Payer: Medicaid Other

## 2016-11-05 ENCOUNTER — Inpatient Hospital Stay (HOSPITAL_COMMUNITY): Payer: Medicaid Other | Admitting: Physical Therapy

## 2016-11-05 DIAGNOSIS — B182 Chronic viral hepatitis C: Secondary | ICD-10-CM

## 2016-11-05 DIAGNOSIS — E1142 Type 2 diabetes mellitus with diabetic polyneuropathy: Secondary | ICD-10-CM

## 2016-11-05 DIAGNOSIS — I639 Cerebral infarction, unspecified: Secondary | ICD-10-CM

## 2016-11-05 LAB — GLUCOSE, CAPILLARY
GLUCOSE-CAPILLARY: 202 mg/dL — AB (ref 65–99)
Glucose-Capillary: 96 mg/dL (ref 65–99)
Glucose-Capillary: 157 mg/dL — ABNORMAL HIGH (ref 65–99)
Glucose-Capillary: 73 mg/dL (ref 65–99)

## 2016-11-05 MED ORDER — ELBASVIR-GRAZOPREVIR 50-100 MG PO TABS
1.0000 | ORAL_TABLET | Freq: Every day | ORAL | Status: DC
  Administered 2016-11-05 – 2016-11-11 (×7): 1 via ORAL
  Filled 2016-11-05 (×13): qty 1

## 2016-11-05 NOTE — Progress Notes (Signed)
John Jacobson is a 61 y.o. male 01-Apr-1956 [id number]  Subjective: No new complaints. No new problems. Slept well. Feeling OK. Asking about his Hep C Rx - he has 2 more weeks to complete  Objective: Vital signs in last 24 hours: Temp:  [97.6 F (36.4 C)-99 F (37.2 C)] 98.1 F (36.7 C) (05/26 0512) Pulse Rate:  [52-55] 53 (05/26 0753) Resp:  [18-20] 18 (05/26 0512) BP: (177-187)/(72-89) 179/73 (05/26 0753) SpO2:  [100 %] 100 % (05/26 0512) Weight:  [179 lb 6.4 oz (81.4 kg)-179 lb 6.8 oz (81.4 kg)] 179 lb 6.8 oz (81.4 kg) (05/26 0512) Weight change:  Last BM Date: 10/31/16 (miralax given daily)  Intake/Output from previous day: 05/25 0701 - 05/26 0700 In: 360 [P.O.:360] Out: 600 [Urine:600] Last cbgs: CBG (last 3)   Recent Labs  11/04/16 1711 11/04/16 2056 11/05/16 0619  GLUCAP 174* 137* 96     Physical Exam General: No apparent distress   HEENT: not dry Lungs: Normal effort. Lungs clear to auscultation, no crackles or wheezes. Cardiovascular: Regular rate and rhythm, no edema Abdomen: S/NT/ND; BS(+) Musculoskeletal:  unchanged Neurological: No new neurological deficits Wounds: N/A    Skin: clear   Mental state: Alert, oriented, cooperative    Lab Results: BMET    Component Value Date/Time   NA 138 11/04/2016 1416   K 4.2 11/04/2016 1416   CL 105 11/04/2016 1416   CO2 25 11/04/2016 1416   GLUCOSE 93 11/04/2016 1416   BUN 21 (H) 11/04/2016 1416   CREATININE 1.91 (H) 11/04/2016 1416   CALCIUM 8.7 (L) 11/04/2016 1416   GFRNONAA 36 (L) 11/04/2016 1416   GFRAA 42 (L) 11/04/2016 1416   CBC    Component Value Date/Time   WBC 11.5 (H) 11/04/2016 1416   RBC 3.82 (L) 11/04/2016 1416   HGB 11.3 (L) 11/04/2016 1416   HCT 34.4 (L) 11/04/2016 1416   PLT 192 11/04/2016 1416   MCV 90.1 11/04/2016 1416   MCH 29.6 11/04/2016 1416   MCHC 32.8 11/04/2016 1416   RDW 14.1 11/04/2016 1416   LYMPHSABS 3.5 11/03/2016 0629   MONOABS 1.1 (H) 11/03/2016 0629   EOSABS  0.2 11/03/2016 0629   BASOSABS 0.0 11/03/2016 0629    Studies/Results: No results found.  Medications: I have reviewed the patient's current medications.  Assessment/Plan:   1. L CVA - in Rehab. Plavix, Lipitor, BP meds 2. DVT proph w/sq heparin 3. DM2. Lantus, Glucotrol. 4. HTN. Lasix, hydralazine, Cozaar 5. CKD 3. Monitor labs 6. Hypokalemia. Monitor BMET. 7. Hep C. Finish Zepatier x 2 wks (pt's own supply from [place])  Length of stay, days: [place] , MD 11/05/2016, 9:23 AM

## 2016-11-05 NOTE — Evaluation (Signed)
Physical Therapy Assessment and Plan  Patient Details  Name: John Jacobson MRN: [id number] Date of Birth: March 12, 1956  PT Diagnosis: Abnormality of gait, Difficulty walking, Impaired cognition, Impaired sensation and Muscle weakness Rehab Potential: Excellent ELOS:  (7 to 10 days)   Today's Date: 11/05/2016 PT Individual Time: 0901-1000 PT Individual Time Calculation (min): 59 min    Problem List:  Patient Active Problem List   Diagnosis Date Noted  . Cerebellar infarction ([place]) 11/04/2016  . Type 2 diabetes mellitus with peripheral neuropathy (HCC)   . Hyperlipidemia   . Gait disturbance, post-stroke   . CKD (chronic kidney disease), stage III 11/02/2016  . Normocytic anemia 11/02/2016  . Ataxia 11/02/2016  . Ischemic stroke ([place]) 11/02/2016  . Puncture wound of right foot 11/02/2016  . Chronic diastolic CHF (congestive heart failure) ([place]) 11/02/2016  . Chronic left-sided low back pain 11/02/2016  . Demand ischemia ([place])   . ARF (acute renal failure) ([place])   . Hypertensive urgency 03/29/2016  . Abnormal electrocardiogram 05/24/2011  . Chest pain 05/24/2011  . Hypertension 05/24/2011  . Tobacco abuse 05/24/2011  . DM (diabetes mellitus), type 2 with renal complications ([place]) 41/28/7867    Past Medical History:  Past Medical History:  Diagnosis Date  . Asthma   . DM (diabetes mellitus) ([place])   . HTN (hypertension)   . RBBB (right bundle branch block)    Past Surgical History:  Past Surgical History:  Procedure Laterality Date  . BACK SURGERY    . HAND SURGERY      Assessment & Plan Clinical Impression: Patient is a 61 y.o. year old male with recent admission to the hospital on 11-02-16 with with history of asthma, diabetes mellitus, hypertension, right bundle branch block, tobacco abuse, CKD stage III, chronic diastolic congestive heart failure. 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 One level home. Brother recently had CABG. Presented  with dizziness as well as gait disturbance and left-sided numbness over the past 4 days as well as reported fall. Patient also reported that he had stepped on a nail with his right foot. CT/MRI showed patchy multifocal acute ischemic infarcts involving the superior left cerebellar hemisphere and left midbrain/pons. No associated hemorrhage or mass effect. Scattered remote lacunar infarcts involving the bilateral basal ganglia corona radiata as well as left thalamus. 3 mm pituitary lesion indeterminate. MRA negative for large vessel occlusion. Patient did not receive TPA. Echocardiogram with ejection fraction 65% and grade 1 diastolic dysfunction.Carotid Dopplers with no ICA stenosis.Neurology consulted presently on Plavix for CVA prophylaxis. Tolerating a regular diet. Subcutaneous heparin added for DVT prophylaxis. Physical and occupationaltherapy evaluation completed 11/03/2016 with recommendations of physical medicine rehabilitation consult.Patient was admitted for a comprehensive rehabilitation program  Patient transferred to CIR on 11/04/2016 .   Patient currently requires min with mobility secondary to muscle weakness, decreased cardiorespiratoy endurance, impaired timing and sequencing and decreased motor planning, decreased strenght and decreased standing balance and decreased balance strategies.  Prior to hospitalization, patient was independent  with mobility and lived with  (brother) in a House home.  Home access is  Other (comment) (1 step up to landing; has to walk over grass to get to landing).  Patient will benefit from skilled PT intervention to maximize safe functional mobility, minimize fall risk and decrease caregiver burden for planned discharge home with intermittent assist.  Anticipate patient will benefit from follow up [facility] at discharge.  PT - End  of Session Activity Tolerance: Tolerates 30+ min activity  with multiple rests Endurance Deficit: Yes Endurance Deficit Description:  (Limited in endurance with functional tasks) PT Assessment Rehab Potential (ACUTE/IP ONLY): Excellent Barriers to Discharge: Decreased caregiver support PT Patient demonstrates impairments in the following area(s): Balance;Motor;Endurance;Pain;Safety;Perception;Sensory PT Transfers Functional Problem(s): Bed Mobility;Bed to Chair;Car;Furniture PT Locomotion Functional Problem(s): Ambulation;Wheelchair Mobility;Stairs PT Plan PT Intensity: Minimum of 1-2 x/day ,45 to 90 minutes PT Frequency: 5 out of 7 days PT Duration Estimated Length of Stay:  (7 to 10 days) PT Treatment/Interventions: Ambulation/gait training;Balance/vestibular training;Cognitive remediation/compensation;Community reintegration;Discharge planning;Disease management/prevention;DME/adaptive equipment instruction;Functional mobility training;Neuromuscular re-education;Pain management;Patient/family education;Stair training;Therapeutic Activities;Therapeutic Exercise;UE/LE Strength taining/ROM;UE/LE Coordination activities;Wheelchair propulsion/positioning PT Transfers Anticipated Outcome(s):  (Mod I) PT Locomotion Anticipated Outcome(s):  (Mod I) PT Recommendation Recommendations for Other Services: Therapeutic Recreation consult Therapeutic Recreation Interventions: Stress management;Kitchen group;Outing/community reintergration Follow Up Recommendations: Home health PT Patient destination: Home Equipment Recommended: Rolling walker with 5" wheels;Cane Equipment Details:  (Cane vs rolling walker)  Skilled Therapeutic Intervention Session focused on education on utilizing wheelchair safely, safety with transfers and gait, and also education on calling nursing for help with mobility.  Following session, pt returned to bed with 3 rails up and call bell in reach.  Nursing aware of location.  Pt reported 4/10 pain in neck and L hip - denied wanting pain  meds.  Vitals initial:  BP:  175/71 (per nursing MD is aware);  After transfer to chair and dressing:  166/71; HR: 63 bpm;  After session:  170/72.  Nursing made aware of pt intermittent lightheadedness.  Vitals did not change with c/o.  Sp02 near 100% t/o session.    PT Evaluation Precautions/Restrictions Precautions Precautions: Fall Precaution Comments:  (Monitor BP) Restrictions Weight Bearing Restrictions: No General Chart Reviewed: Yes Response to Previous Treatment: Patient with no complaints from previous session. Family/Caregiver Present: No Vital Signs Pain Pain Assessment Pain Assessment: No/denies pain Pain Score: 4  Pain Type: Acute pain Pain Location: Hip Pain Orientation: Right Home Living/Prior Functioning Home Living Available Help at Discharge: Family;Available PRN/intermittently Type of Home: House Home Access: Other (comment) (1 step up to landing; has to walk over grass to get to landing) Home Layout: One level Bathroom Shower/Tub: Chiropodist: Standard  Lives With:  (brother) Prior Function Level of Independence: Independent with basic ADLs  Able to Take Stairs?: Yes Driving: No Vision/Perception  Perception Perception: Within Functional Limits Praxis Praxis: Impaired Praxis Impairment Details: Motor planning  Cognition Overall Cognitive Status: Within Functional Limits for tasks assessed Arousal/Alertness: Awake/alert Orientation Level: Oriented X4 Selective Attention: Appears intact Memory: Appears intact Awareness: Appears intact Problem Solving: Appears intact Safety/Judgment: Appears intact Sensation Sensation Light Touch: Impaired by gross assessment Additional Comments:  (Decreased sensation to light touch in  L UE/LE) Coordination Gross Motor Movements are Fluid and Coordinated: Yes Fine Motor Movements are Fluid and Coordinated: Not tested Finger Nose Finger Test: LUE slightly ataxic Motor  Motor Motor:  Hemiplegia Motor - Skilled Clinical Observations:  (L UE/LE weakness)  Mobility Transfers Sit to Stand: 5: Supervision     Trunk/Postural Assessment  Cervical Assessment Cervical Assessment: Within Functional Limits Thoracic Assessment Thoracic Assessment:  (rounded shoulders) Lumbar Assessment Lumbar Assessment:  (posterior pelvic tilt) Postural Control Postural Control:  (Requires CGA for standing balance)  Balance Balance Balance Assessed: Yes Dynamic Sitting Balance Sitting balance - Comments:  (No LOB while sitting EOB to don pants this AM) Dynamic Standing Balance Dynamic Standing - Balance Support: No upper extremity supported  Dynamic Standing - Level of Assistance: 4: Min assist Dynamic Standing - Comments: Standing Extremity Assessment  RUE Assessment RUE Assessment: Within Functional Limits LUE Assessment LUE Assessment: Exceptions to [facility] LUE Strength LUE Overall Strength Comments:  (At least 3/5 as pt able to use B UE functionally for donning shirt) RLE Assessment RLE Assessment: Within Functional Limits LLE Assessment LLE Assessment: Exceptions to [facility] LLE Strength LLE Overall Strength Comments:  (Pt able to ambulate and clears L foot without difficulty.  No significant functional deficits noted.)   See Function Navigator for Current Functional Status.   Refer to Care Plan for Long Term Goals  Recommendations for other services: Therapeutic Recreation  Kitchen group, Stress management and Outing/community reintegration  Discharge Criteria: Patient will be discharged from PT if patient refuses treatment 3 consecutive times without medical reason, if treatment goals not met, if there is a change in medical status, if patient makes no progress towards goals or if patient is discharged from hospital.  The above assessment, treatment plan, treatment alternatives and goals were discussed and mutually agreed upon: by patient  Falana Clagg [name] 11/05/2016, 12:29  PM

## 2016-11-05 NOTE — Evaluation (Signed)
Occupational Therapy Assessment and Plan  Patient Details  Name: John Jacobson MRN: [id number] Date of Birth: Mar 05, 1956  OT Diagnosis: acute pain, ataxia, hemiplegia affecting non-dominant side, muscle weakness (generalized) and pain in joint Rehab Potential:   ELOS: 5-7   Today's Date: 11/05/2016 OT Individual Time: 1100-1200 OT Individual Time Calculation (min): 60 min     Problem List:  Patient Active Problem List   Diagnosis Date Noted  . 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 Type 2 diabetes mellitus with peripheral neuropathy (HCC)   . Hyperlipidemia   . Gait disturbance, post-stroke   . CKD (chronic kidney disease), stage III 11/02/2016  . Normocytic anemia 11/02/2016  . Ataxia 11/02/2016  . Ischemic stroke ([place]) 11/02/2016  . Puncture wound of right foot 11/02/2016  . Chronic diastolic CHF (congestive heart failure) ([place]) 11/02/2016  . Chronic left-sided low back pain 11/02/2016  . Demand ischemia ([place])   . ARF (acute renal failure) ([place])   . Hypertensive urgency 03/29/2016  . Abnormal electrocardiogram 05/24/2011  . Chest pain 05/24/2011  . Hypertension 05/24/2011  . Tobacco abuse 05/24/2011  . DM (diabetes mellitus), type 2 with renal complications ([place]) 42/70/6237    Past Medical History:  Past Medical History:  Diagnosis Date  . Asthma   . DM (diabetes mellitus) ([place])   . HTN (hypertension)   . RBBB (right bundle branch block)    Past Surgical History:  Past Surgical History:  Procedure Laterality Date  . BACK SURGERY    . HAND SURGERY      Assessment & Plan Clinical Impression:John A Smithis a 61 y.o.right handed malewith history of asthma, diabetes mellitus, hypertension, right bundle branch block, tobacco abuse, CKD stage III, chronic diastolic congestive heart failure. 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 One level home. Brother recently  had CABG. Presented 11/02/2016 with dizziness as well as gait disturbance and left-sided numbness over the past 4 days as well as reported fall. Patient also reported that he had stepped on a nail with his right foot. CT/MRI showed patchy multifocal acute ischemic infarcts involving the superior left cerebellar hemisphere and left midbrain/pons. No associated hemorrhage or mass effect. Scattered remote lacunar infarcts involving the bilateral basal ganglia corona radiata as well as left thalamus. 3 mm pituitary lesion indeterminate. MRA negative for large vessel occlusion. Patient did not receive TPA. Echocardiogram with ejection fraction 65% and grade 1 diastolic dysfunction.Carotid Dopplers with no ICA stenosis.Neurology consulted presently on Plavix for CVA prophylaxis. Tolerating a regular diet. Subcutaneous heparin added for DVT prophylaxis. Physical and occupationaltherapy evaluation completed 11/03/2016 with recommendations of physical medicine rehabilitation consult.Patient was admitted for a comprehensive rehabilitation program .    Patient currently requires min- supervision with basic self-care skills secondary to muscle weakness, decreased cardiorespiratoy endurance, ataxia, decreased coordination and decreased motor planning, decreased motor planning and decreased safety awareness.  Prior to hospitalization, patient could complete BADL/IADL with modified independent .  Patient will benefit from skilled intervention to increase independence with basic self-care skills and increase level of independence with iADL prior to discharge home independently.  Anticipate patient will require no supervision and no further OT follow recommended.  OT - End of Session Endurance Deficit: Yes OT Assessment Barriers to Discharge: Decreased caregiver support Barriers to Discharge Comments: brother just having CABG unable to assist OT Patient demonstrates impairments in the following area(s):  Balance;Endurance;Motor;Pain;Safety OT Basic ADL's Functional Problem(s): Grooming;Bathing;Dressing;Toileting OT Advanced ADL's Functional Problem(s):  Simple Meal Preparation;Laundry;Light Housekeeping OT Transfers Functional Problem(s): Toilet;Tub/Shower OT Additional Impairment(s): Fuctional Use of Upper Extremity OT Plan OT Intensity: Minimum of 1-2 x/day, 45 to 90 minutes OT Frequency: 5 out of 7 days OT Duration/Estimated Length of Stay: 5-7 OT Treatment/Interventions: Balance/vestibular training;Disease mangement/prevention;Discharge planning;DME/adaptive equipment instruction;Neuromuscular re-education;Functional mobility training;Pain management;Patient/family education;Psychosocial support;Self Care/advanced ADL retraining;Therapeutic Activities;Therapeutic Exercise;UE/LE Strength taining/ROM;UE/LE Coordination activities OT Self Feeding Anticipated Outcome(s): MOD I OT Basic Self-Care Anticipated Outcome(s): MOD I OT Toileting Anticipated Outcome(s): MOD I OT Bathroom Transfers Anticipated Outcome(s): MOD I OT Recommendation Patient destination: Home Follow Up Recommendations: None Equipment Recommended: Tub/shower bench;Tub/shower seat;To be determined   Skilled Therapeutic Intervention 1:1. Education on OT role/purpose, POC, CIR, and participation in tx. Per pt and PT report, pt dressed in PT evaluation with supervision for UB dressing and min guard for standing while advancing pants over hips. Pt ambulates throughout session with touching A fading to supervision with Vc for stride length and RW management. OT educated pt on walking RW over toilet for balance while voiding urine in standing. Pt return demo with supervision. Gathers laundry, loads in washing machine and starts washer with supervision and VC for safety awareness. Pt stands at sink in room and shaves with RUE for ~20 min without rest break with Vc to use LUE to apply shaving cream. Exited session with pt seated EOB with  call light in reach and all needs met.   1:1. Focus of second session on functional ambulation without AD, BADL retraining, and standing balance. Pt folds clothes and puts away in dresser in standing with S to incorporate BUE use with VC for posture and BOS. Pt ambulates throughout room without AD with supervision and VC to not furniture walk. Pt bathes seated with supervision at sit to stand level in shower with Vc for crossing LE to wash feet. Pt dons clothes seated EOB with VC to thread BLE in seated (LLE first) to decrease fall risk. Pt propels w/c to/from all therapeutic destinations with increased time to improve BUE strength with VC to push harder with LUE. Pt gathers items from kitchen from various level in cabinets and appliances with VC to use LUE to reach. Exited session with pt seated in bed and call light in reach.   OT Evaluation Precautions/Restrictions  Precautions Precautions: Fall Restrictions Weight Bearing Restrictions: No General Chart Reviewed: Yes Vital Signs  Pain Pain Assessment Pain Assessment: No/denies pain Pain Score: 4  Pain Type: Acute pain Pain Location: Hip Pain Orientation: Right Home Living/Prior Functioning Home Living Family/patient expects to be discharged to:: Private residence Living Arrangements: Other relatives, Children Available Help at Discharge: Family, Available PRN/intermittently Type of Home: Apartment Home Access: Level entry Home Layout: One level Bathroom Shower/Tub: Chiropodist: Standard  Lives With:  (brother) IADL History Homemaking Responsibilities: Yes Meal Prep Responsibility: Therapist, occupational Responsibility: Primary Cleaning Responsibility: Primary Bill Paying/Finance Responsibility: Primary Shopping Responsibility: Primary Current License: No Mode of Transportation: Bus, Family, Other [place]comment[place] Occupation: Part time employment, Retired Type of Occupation: Chief Technology Officer;  retired Insurance account manager Leisure and Hobbies: cooking; 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 Prior Function Level of Independence: Independent with basic ADLs  Able to Take Stairs?: Yes Driving: No ADL   Vision Patient Visual Report: No change from baseline Vision Assessment?: No apparent visual deficits Perception  Perception: Within Functional Limits Praxis Praxis: Impaired Praxis Impairment Details: Motor planning Cognition Arousal/Alertness: Awake/alert Orientation Level: Person;Place;Situation Person: Oriented Place: Oriented  Situation: Oriented Year: 2018 Month: May Day of Week: Correct Memory: Appears intact Immediate Memory Recall: Sock;Blue;Bed Memory Recall: Sock;Blue;Bed Memory Recall Sock: Without Cue Memory Recall Blue: Without Cue Memory Recall Bed: Without Cue Sensation Sensation Light Touch: Appears Intact Coordination Gross Motor Movements are Fluid and Coordinated: Yes Fine Motor Movements are Fluid and Coordinated: No Finger Nose Finger Test: LUE slightly ataxic Motor  Motor Motor: Hemiplegia Motor - Skilled Clinical Observations: LUE generalized weakness Mobility  Transfers Transfers: Sit to Stand Sit to Stand: 5: Supervision  Trunk/Postural Assessment  Cervical Assessment Cervical Assessment: Within Functional Limits Thoracic Assessment Thoracic Assessment:  (rounded shoulders) Lumbar Assessment Lumbar Assessment:  (posterior pelvic tilt) Postural Control Postural Control: Within Functional Limits  Balance Balance Balance Assessed: Yes Dynamic Sitting Balance Sitting balance - Comments: no LOB with donning socks Dynamic Standing Balance Dynamic Standing - Balance Support: Left upper extremity supported Dynamic Standing - Level of Assistance: 5: Stand by assistance Dynamic Standing - Comments: Standing Extremity/Trunk Assessment RUE Assessment RUE Assessment: Within Functional Limits LUE Assessment LUE  Assessment: Exceptions to [facility] (generalized weakness)   See Function Navigator for Current Functional Status.   Refer to Care Plan for Long Term Goals  Recommendations for other services: Therapeutic Recreation  Kitchen group   Discharge Criteria: Patient will be discharged from OT if patient refuses treatment 3 consecutive times without medical reason, if treatment goals not met, if there is a change in medical status, if patient makes no progress towards goals or if patient is discharged from hospital.  The above assessment, treatment plan, treatment alternatives and goals were discussed and mutually agreed upon: by patient  Tonny Branch 11/05/2016, 11:53 AM

## 2016-11-06 ENCOUNTER — Inpatient Hospital Stay (HOSPITAL_COMMUNITY): Payer: Medicaid Other | Admitting: Physical Therapy

## 2016-11-06 LAB — VAS US CAROTID
LCCADDIAS: -17 cm/s
LCCADSYS: -98 cm/s
LCCAPSYS: 95 cm/s
LEFT ECA DIAS: -6 cm/s
LEFT VERTEBRAL DIAS: -15 cm/s
LICADSYS: -64 cm/s
Left CCA prox dias: 16 cm/s
Left ICA dist dias: -25 cm/s
Left ICA prox dias: 23 cm/s
Left ICA prox sys: 73 cm/s
RCCADSYS: -71 cm/s
RCCAPSYS: -115 cm/s
RIGHT ECA DIAS: -7 cm/s
RIGHT VERTEBRAL DIAS: -9 cm/s
Right CCA prox dias: -10 cm/s

## 2016-11-06 LAB — GLUCOSE, CAPILLARY
GLUCOSE-CAPILLARY: 173 mg/dL — AB (ref 65–99)
GLUCOSE-CAPILLARY: 162 mg/dL — AB (ref 65–99)
Glucose-Capillary: 105 mg/dL — ABNORMAL HIGH (ref 65–99)
Glucose-Capillary: 198 mg/dL — ABNORMAL HIGH (ref 65–99)

## 2016-11-06 MED ORDER — SALINE SPRAY 0.65 % NA SOLN
1.0000 | NASAL | Status: DC | PRN
  Filled 2016-11-06: qty 44

## 2016-11-06 NOTE — Progress Notes (Signed)
Physical Therapy Session Note  Patient Details  Name: John Jacobson MRN: [id number] Date of Birth: 01/31/1956  Today's Date: 11/06/2016 PT Individual Time: 5573-2202 PT Individual Time Calculation (min): 85 min   Short Term Goals: Week 1:  PT Short Term Goal 1 (Week 1): Pt will ambulate 150 ft with least restrictive assistive device and supervision PT Short Term Goal 2 (Week 1): Pt will perform bed to chair transfer with least restrictive device and supervision PT Short Term Goal 3 (Week 1): Pt will ascend and descend 1 step to landing with least restrictive device and supervision  Skilled Therapeutic Interventions/Progress Updates:  Pt received in room & agreeable to tx. Session focused on balance, stair negotiation, gait training, activity tolerance, & BLE strengthening. Gait throughout unit (room<>apartment, gym) with RW & supervision and without AD and min assist 2/2 frequent LOB. Pt with decreased strength & balance when weight bearing through LLE. Pt requesting to iron clothing and tolerated standing ~15 minutes with close supervision while engaging in ironing with RUE & manipulating/folding clothes with BUE. Pt reports some difficulty when using LUE to assist with managing clothing. Pt also requires rest break 2/2 increasing back pain that is relieved with sitting. In gym pt negotiated 4 steps with LUE for support on single rail only with therapist providing cuing for compensatory pattern and supervision for overall balance. Pt utilized biodex limits of stability progressing from BUE support to 1UE support with decreased accuracy when supporting himself with RUE only. Pt utilized cybex kinetron in sitting, up to 40 cm/sec, for BLE strengthening & LLE NMR. Pt reported pressure along inside of L head & R temple with cervical extension that dissipated with neutral head alignment; RN made aware. At end of session pt left in bed in room with alarm set & all needs within reach.   Therapy  Documentation Precautions:  Precautions Precautions: Fall Precaution Comments:  (Monitor BP) Restrictions Weight Bearing Restrictions: No  Vital Signs: Sitting at beginning of session: BP = 157/82 mmHg HR = 82 bpm  After using Biodex: BP = 137/64 mmHg HR = 80 bpm  At end of session, sitting EOB: BP = 134/69 mmHg HR = 75 bpm  Pain: "slight" pain in L shoulder & hip - rest breaks provided PRN.  See Function Navigator for Current Functional Status.   Therapy/Group: Individual Therapy  Waunita Schooner 11/06/2016, 2:49 PM

## 2016-11-06 NOTE — Progress Notes (Signed)
John Jacobson is a 61 y.o. male 03-07-1956 [id number]  Subjective: C/o dry nose w/bleeds. No new problems otherwise. Slept well. Feeling OK.  Objective: Vital signs in last 24 hours: Temp:  [97.8 F (36.6 C)-98.3 F (36.8 C)] 97.8 F (36.6 C) (05/27 0504) Pulse Rate:  [53-87] 87 (05/27 0802) Resp:  [18] 18 (05/27 0504) BP: (146-178)/(68-87) 173/87 (05/27 0802) SpO2:  [98 %-99 %] 99 % (05/27 0504) Weight:  [179 lb 7.2 oz (81.4 kg)] 179 lb 7.2 oz (81.4 kg) (05/27 0504) Weight change: 0.8 oz (0.022 kg) Last BM Date: 11/05/16  Intake/Output from previous day: 05/26 0701 - 05/27 0700 In: 600 [P.O.:600] Out: 500 [Urine:500] Last cbgs: CBG (last 3)   Recent Labs  11/05/16 1625 11/05/16 2025 11/06/16 0624  GLUCAP 202* 73 173*     Physical Exam General: No apparent distress   HEENT: R nostryl is bloody; dry Lungs: Normal effort. Lungs clear to auscultation, no crackles or wheezes. Cardiovascular: Regular rate and rhythm, no edema Abdomen: S/NT/ND; BS(+) Musculoskeletal:  unchanged Neurological: No new neurological deficits Wounds: n/a   Skin: clear  Aging changes Mental state: Alert, oriented, cooperative    Lab Results: BMET    Component Value Date/Time   NA 138 11/04/2016 1416   K 4.2 11/04/2016 1416   CL 105 11/04/2016 1416   CO2 25 11/04/2016 1416   GLUCOSE 93 11/04/2016 1416   BUN 21 (H) 11/04/2016 1416   CREATININE 1.91 (H) 11/04/2016 1416   CALCIUM 8.7 (L) 11/04/2016 1416   GFRNONAA 36 (L) 11/04/2016 1416   GFRAA 42 (L) 11/04/2016 1416   CBC    Component Value Date/Time   WBC 11.5 (H) 11/04/2016 1416   RBC 3.82 (L) 11/04/2016 1416   HGB 11.3 (L) 11/04/2016 1416   HCT 34.4 (L) 11/04/2016 1416   PLT 192 11/04/2016 1416   MCV 90.1 11/04/2016 1416   MCH 29.6 11/04/2016 1416   MCHC 32.8 11/04/2016 1416   RDW 14.1 11/04/2016 1416   LYMPHSABS 3.5 11/03/2016 0629   MONOABS 1.1 (H) 11/03/2016 0629   EOSABS 0.2 11/03/2016 0629   BASOSABS 0.0  11/03/2016 0629    Studies/Results: No results found.  Medications: I have reviewed the patient's current medications.  Assessment/Plan:  1. CVA L - CIR. Lipitor, Plavix, BP meds 2. DVT proph w/Heparin 3. DM 2. Glucotrol and Lantus 4. HTN> Hydralazine, Lasix, Cozaar 5. CKD 3 - Monitor labs 6. Hypokalemia - BMET 7. Dry nose - NS spray 8. Hep C. Finish Zepatier x 2 wks (pt's own supply from [place])    Length of stay, days: 2  Walker Kehr , MD 11/06/2016, 10:58 AM

## 2016-11-07 ENCOUNTER — Inpatient Hospital Stay (HOSPITAL_COMMUNITY): Payer: Medicaid Other | Admitting: Physical Therapy

## 2016-11-07 ENCOUNTER — Inpatient Hospital Stay (HOSPITAL_COMMUNITY): Payer: Medicaid Other | Admitting: Speech Pathology

## 2016-11-07 DIAGNOSIS — D62 Acute posthemorrhagic anemia: Secondary | ICD-10-CM

## 2016-11-07 DIAGNOSIS — I6389 Other cerebral infarction: Secondary | ICD-10-CM

## 2016-11-07 DIAGNOSIS — E876 Hypokalemia: Secondary | ICD-10-CM

## 2016-11-07 DIAGNOSIS — I638 Other cerebral infarction: Secondary | ICD-10-CM

## 2016-11-07 DIAGNOSIS — D72829 Elevated white blood cell count, unspecified: Secondary | ICD-10-CM

## 2016-11-07 DIAGNOSIS — E113592 Type 2 diabetes mellitus with proliferative diabetic retinopathy without macular edema, left eye: Secondary | ICD-10-CM

## 2016-11-07 DIAGNOSIS — I10 Essential (primary) hypertension: Secondary | ICD-10-CM

## 2016-11-07 DIAGNOSIS — I5032 Chronic diastolic (congestive) heart failure: Secondary | ICD-10-CM

## 2016-11-07 DIAGNOSIS — N183 Chronic kidney disease, stage 3 (moderate): Secondary | ICD-10-CM

## 2016-11-07 LAB — CBC WITH DIFFERENTIAL/PLATELET
Basophils Absolute: 0.1 10*3/uL (ref 0.0–0.1)
Basophils Relative: 0 %
EOS ABS: 0.3 10*3/uL (ref 0.0–0.7)
Eosinophils Relative: 3 %
HEMATOCRIT: 32.4 % — AB (ref 39.0–52.0)
HEMOGLOBIN: 10.4 g/dL — AB (ref 13.0–17.0)
LYMPHS ABS: 3 10*3/uL (ref 0.7–4.0)
Lymphocytes Relative: 25 %
MCH: 29 pg (ref 26.0–34.0)
MCHC: 32.1 g/dL (ref 30.0–36.0)
MCV: 90.3 fL (ref 78.0–100.0)
MONOS PCT: 7 %
Monocytes Absolute: 0.8 10*3/uL (ref 0.1–1.0)
NEUTROS ABS: 7.8 10*3/uL — AB (ref 1.7–7.7)
NEUTROS PCT: 65 %
Platelets: 193 10*3/uL (ref 150–400)
RBC: 3.59 MIL/uL — ABNORMAL LOW (ref 4.22–5.81)
RDW: 14 % (ref 11.5–15.5)
WBC: 11.8 10*3/uL — ABNORMAL HIGH (ref 4.0–10.5)

## 2016-11-07 LAB — COMPREHENSIVE METABOLIC PANEL
ALK PHOS: 86 U/L (ref 38–126)
ALT: 22 U/L (ref 17–63)
ANION GAP: 10 (ref 5–15)
AST: 22 U/L (ref 15–41)
Albumin: 3 g/dL — ABNORMAL LOW (ref 3.5–5.0)
BUN: 34 mg/dL — ABNORMAL HIGH (ref 6–20)
CALCIUM: 8.6 mg/dL — AB (ref 8.9–10.3)
CO2: 23 mmol/L (ref 22–32)
Chloride: 101 mmol/L (ref 101–111)
Creatinine, Ser: 2.4 mg/dL — ABNORMAL HIGH (ref 0.61–1.24)
GFR, EST AFRICAN AMERICAN: 32 mL/min — AB (ref >60)
GFR, EST NON AFRICAN AMERICAN: 27 mL/min — AB (ref >60)
Glucose, Bld: 272 mg/dL — ABNORMAL HIGH (ref 65–99)
Potassium: 4.6 mmol/L (ref 3.5–5.1)
Sodium: 134 mmol/L — ABNORMAL LOW (ref 135–145)
TOTAL PROTEIN: 6.5 g/dL (ref 6.5–8.1)
Total Bilirubin: 0.3 mg/dL (ref 0.3–1.2)

## 2016-11-07 LAB — GLUCOSE, CAPILLARY
GLUCOSE-CAPILLARY: 150 mg/dL — AB (ref 65–99)
Glucose-Capillary: 156 mg/dL — ABNORMAL HIGH (ref 65–99)
Glucose-Capillary: 156 mg/dL — ABNORMAL HIGH (ref 65–99)
Glucose-Capillary: 220 mg/dL — ABNORMAL HIGH (ref 65–99)

## 2016-11-07 MED ORDER — LOSARTAN POTASSIUM 50 MG PO TABS
75.0000 mg | ORAL_TABLET | Freq: Every day | ORAL | Status: DC
  Administered 2016-11-08: 08:00:00 75 mg via ORAL
  Filled 2016-11-07: qty 1

## 2016-11-07 MED ORDER — LOSARTAN POTASSIUM 25 MG PO TABS
25.0000 mg | ORAL_TABLET | Freq: Once | ORAL | Status: AC
  Administered 2016-11-07: 25 mg via ORAL
  Filled 2016-11-07: qty 1

## 2016-11-07 NOTE — IPOC Note (Addendum)
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 ([place])     Functional Problem List: Nursing Edema, Endurance, Medication Management, Nutrition, Pain, Safety, Skin Integrity  PT Balance, Motor, Endurance, Pain, Safety, Perception, Sensory  OT Balance, Endurance, Motor, Pain, Safety  SLP    TR         Basic ADL's: OT Grooming, Bathing, Dressing, Toileting     Advanced  ADL's: OT Simple Meal Preparation, Laundry, Light Housekeeping     Transfers: PT Bed Mobility, Bed to Chair, Car, Manufacturing systems engineer, Metallurgist: PT Ambulation, Emergency planning/management officer, Stairs     Additional Impairments: OT Fuctional Use of Upper Extremity  SLP        TR      Anticipated Outcomes Item Anticipated Outcome  Self Feeding MOD I  Swallowing      Basic self-care  MOD I  Toileting  MOD I   Bathroom Transfers MOD I  Bowel/Bladder  min assist  Transfers   (Mod I)  Locomotion   (Mod I)  Communication     Cognition     Pain  <3 on a 0-10 pain scale  Safety/Judgment  min assist with appropriate assistive device   Therapy Plan: PT Intensity: Minimum of 1-2 x/day ,45 to 90 minutes PT Frequency: 5 out of 7 days PT Duration Estimated Length of Stay:  (7 to 10 days) OT Intensity: Minimum of 1-2 x/day, 45 to 90 minutes OT Frequency: 5 out of 7 days OT Duration/Estimated Length of Stay: 5-7         Team Interventions: Nursing Interventions Patient/Family Education, Disease  Management/Prevention, Pain Management, Medication Management, Skin Care/Wound Management, Discharge Planning, Psychosocial Support  PT interventions Ambulation/gait training, Training and development officer, Cognitive remediation/compensation, Community reintegration, Discharge planning, Disease management/prevention, DME/adaptive equipment instruction, Functional mobility training, Neuromuscular re-education, Pain management, Patient/family education, Stair training, Therapeutic Activities, Therapeutic Exercise, UE/LE Strength taining/ROM, UE/LE Coordination activities, Wheelchair propulsion/positioning  OT Interventions Training and development officer, Disease mangement/prevention, Discharge planning, DME/adaptive equipment instruction, Neuromuscular re-education, Functional mobility training, Pain management, Patient/family education, Psychosocial support, Self Care/advanced ADL retraining, Therapeutic Activities, Therapeutic Exercise, UE/LE Strength taining/ROM, UE/LE Coordination activities  SLP Interventions    TR Interventions    SW/CM Interventions  Psychosocial Assessment, Discharge Planning & Pt/Family Education    Team Discharge Planning: Destination: PT-Home ,OT- Home , SLP-  Projected Follow-up: PT-Home health PT, OT-  None, SLP-  Projected Equipment Needs: PT-Rolling walker with 5" wheels, Cane, OT- Tub/shower bench, Tub/shower seat, To be determined, SLP-  Equipment Details: PT- (Cane vs rolling walker), OT-  Patient/family involved in discharge planning: PT- Patient,  OT-Patient, SLP-   MD ELOS: 5-7 days. Medical Rehab Prognosis:  Good Assessment: 61 y.o.right handed malewith history of asthma, diabetes mellitus, hypertension, right bundle branch block, tobacco abuse, CKD stage III, chronic diastolic congestive heart failure. Presented 11/02/2016 with dizziness as well as gait disturbance and left-sided numbness over the past 4 days as well as reported fall. Patient also reported that he  had stepped  on a nail with his right foot. CT/MRI showed patchy multifocal acute ischemic infarcts involving the superior left cerebellar hemisphere and left midbrain/pons. No associated hemorrhage or mass effect. Scattered remote lacunar infarcts involving the bilateral basal ganglia corona radiata as well as left thalamus. 3 mm pituitary lesion indeterminate. MRA negative for large vessel occlusion. Patient did not receive TPA. Echocardiogram with ejection fraction 65% and grade 1 diastolic dysfunction.Carotid Dopplers with no ICA stenosis.Neurology consulted presently on Plavix for CVA prophylaxis. Tolerating a regular diet. Pt with resulting functional deficits with gait, balance, and endurance.  Will set goals for Mod I with PT/OT    See Team Conference Notes for weekly updates to the plan of care

## 2016-11-07 NOTE — Progress Notes (Signed)
[facility] PHYSICAL MEDICINE & REHABILITATION     PROGRESS NOTE  Subjective/Complaints:  Pt seen working with therapies this AM.  He slept well overnight.  He had a good weekend.  He complains of floaters in his left eye.   ROS: +Left eye visual disturbance. Denies CP, SOB, N/V/D.  Objective: Vital Signs: Blood pressure (!) 173/75, pulse (!) 53, temperature 98.1 F (36.7 C), temperature source Oral, resp. rate 16, height 5\' 6"  (1.676 m), weight 81.3 kg (179 lb 3.3 oz), SpO2 100 %. No results found.  Recent Labs  11/04/16 1416  WBC 11.5*  HGB 11.3*  HCT 34.4*  PLT 192    Recent Labs  11/04/16 1416  NA 138  K 4.2  CL 105  GLUCOSE 93  BUN 21*  CREATININE 1.91*  CALCIUM 8.7*   CBG (last 3)   Recent Labs  11/06/16 1614 11/06/16 2106 11/07/16 0623  GLUCAP 162* 198* 156*    Wt Readings from Last 3 Encounters:  11/07/16 81.3 kg (179 lb 3.3 oz)  11/04/16 80.1 kg (176 lb 9.6 oz)  04/02/16 74.3 kg (163 lb 11.2 oz)    Physical Exam:  BP (!) 173/75 (BP Location: Right Arm)   Pulse (!) 53   Temp 98.1 F (36.7 C) (Oral)   Resp 16   Ht 5\' 6"  (1.676 m)   Wt 81.3 kg (179 lb 3.3 oz)   SpO2 100%   BMI 28.92 kg/m  Constitutional: He appears well-developed. NAD. HENT: Normocephalicand atraumatic.  Eyes: EOMI. No discharge.  Cardiovascular: Normal rateand regular rhythm. No JVD. Respiratory: Breath sounds normal. Unlabored.  GI: Soft. Bowel sounds are normal. Musc: Left hand intrinsic atrophy.  Neurological: He is alertand oriented.  Follows simple commands.  Reasonable insight and awarenes Ataxia left arm and leg. Motor 5/5 proximal to distal RUE and RLE.  4/5 proximal to distal LUE and LLE  Psychiatric: He has a normal mood and affect. His behavior is normal.  Skin. Warm and dry  Assessment/Plan: 1. Functional deficits secondary to left cerebellar, midbrain and pontine infarct which require 3+ hours per day of interdisciplinary therapy in a comprehensive  inpatient rehab setting. Physiatrist is providing close team supervision and 24 hour management of active medical problems listed below. Physiatrist and rehab team continue to assess barriers to discharge/monitor patient progress toward functional and medical goals.  Function:  Bathing Bathing position      Bathing parts      Bathing assist        Upper Body Dressing/Undressing Upper body dressing                    Upper body assist        Lower Body Dressing/Undressing Lower body dressing                                  Lower body assist        Toileting Toileting   Toileting steps completed by patient: Adjust clothing prior to toileting, Performs perineal hygiene, Adjust clothing after toileting      Toileting assist Assist level: Supervision or verbal cues   Transfers Chair/bed transfer   Chair/bed transfer method: Ambulatory Chair/bed transfer assist level: Touching or steadying assistance (Pt > 75%) Chair/bed transfer assistive device: Bedrails, Armrests, Walker     Locomotion Ambulation     Max distance:  (150 ft) Assist level: Supervision or verbal cues  Wheelchair     Max wheelchair distance:  (100 ft) Assist Level: Touching or steadying assistance (Pt > 75%)  Cognition Comprehension Comprehension assist level: Understands complex 90% of the time/cues 10% of the time  Expression Expression assist level: Expresses complex 90% of the time/cues < 10% of the time  Social Interaction Social Interaction assist level: Interacts appropriately with others - No medications needed.  Problem Solving Problem solving assist level: Solves basic 75 - 89% of the time/requires cueing 10 - 24% of the time  Memory Memory assist level: Recognizes or recalls 75 - 89% of the time/requires cueing 10 - 24% of the time    Medical Problem List and Plan: 1. Left hemiataxia and gait disturbancesecondary to left cerebellar, midbrain and pontine  infarcts  Cont CIR  Notes reviewed, images reviewed. 2. DVT Prophylaxis/Anticoagulation: Subcutaneous heparin. Monitor platelet counts and any signs of bleeding 3. Pain Management/chronic back pain: Oxycodone as needed 4. Mood: Provide emotional support 5. Neuropsych: This patient iscapable of making decisions on hisown behalf. 6. Skin/Wound Care: Routine skin checks 7. Fluids/Electrolytes/Nutrition: Routine I&Os 8.Diabetes mellitus and peripheral neuropathy and retinopathy.   Hemoglobin A1c 8.6. Lantus insulin 10 units daily at bedtime. Glucotrol 10 mg daily Check CBGs before meals and at bedtime. Diabetic teaching   Eye injections as outpt  Overall controlled 5/28 9.Hypertension with history of right bundle branch block. Clonidine 0.3 mg 3 times a day, Cardura 2 mg twice a day, Lasix 40 mg daily, hydralazine 100 mg 3 times a day,   Cozaar 50 mg daily, increased to 75 on 5/28.   Monitor with increased mobility  Will consider renal artery U/S 10.Chronic diastolic congestive heart failure. Continue Lasix. Monitor for any signs of fluid overload Filed Weights   11/05/16 0512 11/06/16 0504 11/07/16 0530  Weight: 81.4 kg (179 lb 6.8 oz) 81.4 kg (179 lb 7.2 oz) 81.3 kg (179 lb 3.3 oz)  11.CKD stage III.   Cr 1.91 on 5/25, pending for today 12.Hypokalemia.   K+ 4.2 on 5/25, pending today 13.Tobacco abuse. Counseling 14.Constipation. Laxative assistance 15. Hep C  Cont Zepatier x2 weeks. 16. Leukocytosis  WBCs 11.5 on 5/25, labs pending for today  Cont to monitor 17. ABLA  Hb 11/3 on 5/25, pending for today  LOS (Days) 3 A FACE TO FACE EVALUATION WAS PERFORMED  Jareth Pardee Lorie Phenix 11/07/2016 8:57 AM

## 2016-11-07 NOTE — Care Management Note (Signed)
Inpatient Rehabilitation Center Individual Statement of Services  Patient Name:  John Jacobson  Date:  11/07/2016  Welcome to the [facility].  Our goal is to provide you with an individualized program based on your diagnosis and situation, designed to meet your specific needs.  With this comprehensive rehabilitation program, you will be expected to participate in at least 3 hours of rehabilitation therapies Monday-Friday, with modified therapy programming on the weekends.  Your rehabilitation program will include the following services:  Physical Therapy (PT), Occupational Therapy (OT), Speech Therapy (ST), 24 hour per day rehabilitation nursing, Case Management (Social Worker), Rehabilitation Medicine, Nutrition Services and Pharmacy Services  Weekly team conferences will be held on Wednesday to discuss your progress.  Your Social Worker will talk with you frequently to get your input and to update you on team discussions.  Team conferences with you and your family in attendance may also be held.  Expected length of stay: 7-8-days  Overall anticipated outcome: mod/i level  Depending on your progress and recovery, your program may change. Your Social Worker will coordinate services and will keep you informed of any changes. Your Social Worker's name and contact numbers are listed  below.  The following services may also be recommended but are not provided by the [facility] will be made to provide these services after discharge if needed.  Arrangements include referral to agencies that provide these services.  Your insurance has been verified to be:  Medicaid Your primary doctor is:  Dr Cathie Olden [facility]  Pertinent information will be shared with your doctor and your insurance company.  Social Worker:   Ovidio Kin, [place] or ([phone]  Information discussed with and copy given to patient by: Elease Hashimoto, 11/07/2016, 10:40 AM

## 2016-11-07 NOTE — Progress Notes (Signed)
Physical Therapy Session Note  Patient Details  Name: John Jacobson MRN: [id number] Date of Birth: 19-May-1956  Today's Date: 11/07/2016 PT Individual Time: 1000-1115 PT Individual Time Calculation (min): 75 min   Short Term Goals: Week 1:  PT Short Term Goal 1 (Week 1): Pt will ambulate 150 ft with least restrictive assistive device and supervision PT Short Term Goal 2 (Week 1): Pt will perform bed to chair transfer with least restrictive device and supervision PT Short Term Goal 3 (Week 1): Pt will ascend and descend 1 step to landing with least restrictive device and supervision  Skilled Therapeutic Interventions/Progress Updates: Tx1: Pt presented in bed agreeable to therapy. Performed sit to stand from bed with RW min guard. Pt ambulated to rehab gym min guard. Initial BP 153/63. Pt participated in basketball toss, no AD, with pt having x 1 LOB to L with PTA requiring mod assist for correction. Performed toe taps/taps to target no AD requiring minA however no LOB. Pt required increased time for taps to target as required increased time to find target. Performed static balance on wedge for initiating increasing ankle strategy. Performed pipe tree on Airex requiring minA for recall. Pt returned to room in same manner as prior and provided pt edu on safety with RW use as pt placed RW aside in room and furniture walked back to bed. Pt remained sitting at EOB with LSW present and all needs met.   Tx2: Pt presented sitting EOB agreeable to therapy. Pt ambulated to rehab gym with RW, min guard, pt able to perform quick stops (x2) safety due to obstacles. Discussed with pt use of RW when walking up to objects for safety instead of "furniture walking" when in smaller spaces.  Pt performed Biodex catch game with BUE support and no UE support on static level. Pt able to demonstrate increasing lateral wt shift. Performed Level 12 with single UE support requiring minA for recovery. Pt demonstrated increased  difficulty wt shift to L with fatigue. Pt played catch in unsupported sitting and able to reach outside BOS. Performed ball toss with bounce with pt's grandson x 5 min in standing. Pt maintaining balance reaching outside BOS and low reach with no LOB. Performed obstacle course weaving through cones and over walking stick x 2 requiring minA for negotiating RW through smaller spaces. Performed searching for numbers in hallway for increasing L visual scanning, pt able to find 10/10 number with min cues for 2 numbers. Pt returned to room requesting to take shower. Pt reached for clothing sitting at EOB and ambulated to shower min guard. Pt showered sitting on shower bench no assist and donned briefs mod I. Pt returned to bed to complete dressing and remained sitting at EOB with needs met and NT notified.       Therapy Documentation Precautions:  Precautions Precautions: Fall Precaution Comments:  (Monitor BP) Restrictions Weight Bearing Restrictions: No General:   Vital Signs: Therapy Vitals Pulse Rate: (!) 57 BP: (!) 146/73 Patient Position (if appropriate): Sitting   See Function Navigator for Current Functional Status.   Therapy/Group: Individual Therapy  Syenna Nazir  Shellyann Wandrey, PTA  11/07/2016, 1:08 PM

## 2016-11-07 NOTE — Progress Notes (Signed)
Physical Therapy Session Note  Patient Details  Name: John Jacobson MRN: [id number] Date of Birth: 1956/02/27  Today's Date: 11/07/2016 PT Individual Time:  -       Skilled Therapeutic Interventions/Progress Updates:    Session time:  0755-0900  :  Session initiated with pt seated upright in bed eating breakfast with nursing present to give meds.  Pt denies current pain, however, reports having soreness in B LE from yesterday's session for which he just had tylenol.  Initial BP:  151/65;  HR:  60 bpm;  Pt ambulated to gym with front wheel walker and was noted to demonstrate significant cervical spine guarding.  Pt discussed blurred vision in L eye this morning with physician.  Oculomotor exam essentially negative with head in neutral, however, pt has blurred vision in L eye when covering R.  Pt encouraged to continue discussing this with his physician.  When turning without walker and ambulating without device, pt demos en bloc turning so focused session today on head turns during function.  Pt especially does not turn head well to the left and requires significant cueing for this activity.  Session additionally focused on improving L LE neuromotor control with perform step ups with B UE support for balance only x 10 reps and sit to stand for 3 x 5 focusing on L LE recruitment.  Pt BP:  Following above:  121/74.  HR:  83 bpm.  Following session, pt left in care of nursing while toileting.  Therapy Documentation Precautions:  Precautions Precautions: Fall Precaution Comments:  (Monitor BP) Restrictions Weight Bearing Restrictions: No      See Function Navigator for Current Functional Status.   Therapy/Group: Individual Therapy  [name] [name] 11/07/2016, 8:54 AM

## 2016-11-07 NOTE — Progress Notes (Signed)
Social Work Assessment and Plan Social Work Assessment and Plan  Patient Details  Name: John Jacobson MRN: [id number] Date of Birth: 1955/06/21  Today's Date: 11/07/2016  Problem List:  Patient Active Problem List   Diagnosis Date Noted  . Brainstem infarct, acute ([place])   . Benign essential HTN   . Chronic diastolic heart failure ([place])   . Stage 3 chronic kidney disease   . Hypokalemia   . Leukocytosis   . Acute blood loss anemia   . 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 Type 2 diabetes mellitus with peripheral neuropathy (HCC)   . Hyperlipidemia   . Gait disturbance, post-stroke   . CKD (chronic kidney disease), stage III 11/02/2016  . Normocytic anemia 11/02/2016  . Ataxia 11/02/2016  . Ischemic stroke ([place]) 11/02/2016  . Puncture wound of right foot 11/02/2016  . Chronic diastolic CHF (congestive heart failure) ([place]) 11/02/2016  . Chronic left-sided low back pain 11/02/2016  . Demand ischemia ([place])   . ARF (acute renal failure) ([place])   . Hypertensive urgency 03/29/2016  . Abnormal electrocardiogram 05/24/2011  . Chest pain 05/24/2011  . Hypertension 05/24/2011  . Tobacco abuse 05/24/2011  . DM (diabetes mellitus), type 2 with renal complications ([place]) 57/84/6962   Past Medical History:  Past Medical History:  Diagnosis Date  . Asthma   . DM (diabetes mellitus) ([place])   . HTN (hypertension)   . RBBB (right bundle branch block)    Past Surgical History:  Past Surgical History:  Procedure Laterality Date  . BACK SURGERY    . HAND SURGERY     Social History:  reports that he has been smoking.  He has never used smokeless tobacco. He reports that he drinks alcohol. He reports that he does not use drugs.  Family / Support Systems Marital Status: Single Patient Roles: Parent, Other (Comment) (sibling) Children: [name]- [phone]-cell  [name]-daughter [phone] Other Supports: Patent attorney whom he lives with Anticipated Caregiver: [name] can check on him, his brother is there but can not assist him Ability/Limitations of Caregiver: Brother is recovering from CABG and his daughter works Building control surveyor Availability: Intermittent Family Dynamics: Close knit with brother and his daughter's. One daughter is local the other is out of town. He needs to be mod/i before going home due to does not have care at home with his brother recovering from a CABG. He is taking care of himself and can not provide care to pt.  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   Abuse/Neglect Physical Abuse: Denies Verbal Abuse: Denies Sexual Abuse: Denies Exploitation of patient/patient's resources: Denies Self-Neglect: Denies  Emotional Status Pt's affect, behavior adn adjustment status: Pt is motivated and is encouraged by how well he is doing already. He wants to be mod/i and then go home soon if he can. He wil need to be able to take care of himself, due to others can only check on him. He was still recovering from his toe amputation. Recent Psychosocial Issues: recent toe amputation which affected his balance-was furniture walking PTA Pyschiatric History: No history deferred depression screen due to feeling  he is doing well and no need for intervention at this time. He will be a short length of stay due to his high level. Will monitor while here. Substance Abuse History: Tobacco aware not healthy and good for his asthma or CHF. He will think about quitting and knows  the resources out there to help him do this.  Patient / Family Perceptions, Expectations & Goals Pt/Family understanding of illness & functional limitations: Pt can explain his stroke and deficits from this. He has spoken with the MD and feels his questions and cocnerns are being addressed. He is glad to be doing so well and can see his progress daily in therapies. Premorbid pt/family roles/activities: father, retiree, sibling, friend, etc Anticipated changes in roles/activities/participation: resume Pt/family expectations/goals: Pt states: " I want to be able to take care of myself, I really have too since no one else will."   Daughter states: " I hope he does well here."  [employer]: Other (Comment) [name] for [facility]CP) Premorbid Home Care/DME Agencies: Other (Comment) (had in past) Transportation available at discharge: Family and brother Resource referrals recommended: Support group (specify)  Discharge Planning Living Arrangements: Other relatives Support Systems: Children, Other relatives, Friends/neighbors Type of Residence: Private residence Insurance Resources: [employer] (specify county) Pensions consultant: Employment, Advertising account planner Screen Referred: Previously completed Living Expenses: Education officer, community Management: Patient, Family Does the patient have any problems obtaining your medications?: No Home Management: Both he and brother Patient/Family Preliminary Plans: Return home with his brother, he is there but will not assist him due to his own recovery from his heart surgery. Pt has a local daughter who is willing to check on him daily, but this is all she can do. Pt is high level and should reach mod/i level fairly quickly here. Social Work Anticipated Follow Up Needs: HH/OP, Support Group  Clinical Impression Pleasant gentleman who is motivated and doing quite well, he has some safety issues-ie furniture walking which he was doing prior  to admission. PT will work on this with him and try to get him to use a rolling walker when here and when goes home. His brother can be there but not assist and his daughter can check on him daily. Pt wants to go home in five days. Will work with pt on the safest plan for discharge and await team conference on Wed.  Elease Hashimoto 11/07/2016, 1:16 PM

## 2016-11-08 ENCOUNTER — Inpatient Hospital Stay (HOSPITAL_COMMUNITY): Payer: Medicaid Other | Admitting: Physical Therapy

## 2016-11-08 ENCOUNTER — Inpatient Hospital Stay (HOSPITAL_COMMUNITY): Payer: Medicaid Other | Admitting: Occupational Therapy

## 2016-11-08 ENCOUNTER — Inpatient Hospital Stay (HOSPITAL_COMMUNITY): Payer: Medicaid Other | Admitting: Speech Pathology

## 2016-11-08 DIAGNOSIS — N179 Acute kidney failure, unspecified: Secondary | ICD-10-CM

## 2016-11-08 LAB — GLUCOSE, CAPILLARY
GLUCOSE-CAPILLARY: 243 mg/dL — AB (ref 65–99)
Glucose-Capillary: 102 mg/dL — ABNORMAL HIGH (ref 65–99)
Glucose-Capillary: 102 mg/dL — ABNORMAL HIGH (ref 65–99)
Glucose-Capillary: 188 mg/dL — ABNORMAL HIGH (ref 65–99)

## 2016-11-08 MED ORDER — LOSARTAN POTASSIUM 50 MG PO TABS
100.0000 mg | ORAL_TABLET | Freq: Every day | ORAL | Status: DC
  Administered 2016-11-09 – 2016-11-11 (×3): 100 mg via ORAL
  Filled 2016-11-08 (×3): qty 2

## 2016-11-08 MED ORDER — SODIUM CHLORIDE 0.9 % IV SOLN: INTRAVENOUS | Status: DC

## 2016-11-08 MED ORDER — DOXAZOSIN MESYLATE 4 MG PO TABS
4.0000 mg | ORAL_TABLET | Freq: Every day | ORAL | Status: DC
  Administered 2016-11-08 – 2016-11-10 (×3): 4 mg via ORAL
  Filled 2016-11-08 (×3): qty 1

## 2016-11-08 NOTE — Progress Notes (Signed)
Occupational Therapy Session Note  Patient Details  Name: ABIGAIL MARSIGLIA MRN: [id number] Date of Birth: 1955/11/22  Today's Date: 11/08/2016 OT Individual Time: 1400-1500 OT Individual Time Calculation (min): 60 min    Short Term Goals: Week 1:  OT Short Term Goal 1 (Week 1): STG = LTGs d/t ELOS  Skilled Therapeutic Interventions/Progress Updates:    OT treatment session focused on functional mobility, dynamic balance, B UE coordination, and visual scanning. Pt ambulated to therapy gym with overall close supervision and min cues to correct veer to L. Therapeutic activity using horse shoe toss to address dynamic balance. Pt required Min A overall and intermittent Mod A when stepping through w/ LLE to maintain balance. Pt then required min guard A to maintain balance when bending over to collect horse shoes. Seated rest breaks between 3 sets. Fine motor coordination using 9-hole peg test with results as stated below. Standing balance/endurance while participating in B UE coordination activity threading shapes of different sizes. Incorporated visual scanning and figure ground with locating shapes of size and color. Pt ambulated back to room at end of session and left seated EOB with needs met.  9 hole peg test R: 25.54 L: 32.37  Therapy Documentation Precautions:  Precautions Precautions: Fall Precaution Comments:  (Monitor BP) Restrictions Weight Bearing Restrictions: No Pain: Pain Assessment Pain Assessment: 0-10 Pain Score: 5  Pain Type: Acute pain Pain Location: Leg Pain Orientation: Left Pain Descriptors / Indicators: Sore Pain Intervention(s): Repositioned;RN made aware  See Function Navigator for Current Functional Status.   Therapy/Group: Individual Therapy  Valma Cava 11/08/2016, 3:57 PM

## 2016-11-08 NOTE — Evaluation (Signed)
Speech Language Pathology Assessment and Plan  Patient Details  Name: John Jacobson MRN: [id number] Date of Birth: [birth year]  SLP Diagnosis: Cognitive impairments  Rehab Potential: Excellent ELOS: ~1 week    Today's Date: 11/08/2016 SLP Individual Time: 7989-2119 SLP Individual Time Calculation (min): 55 min   Problem List:  Patient Active Problem List   Diagnosis Date Noted  . AKI (acute kidney injury) ([place])   . Brainstem infarct, acute ([place])   . Benign essential HTN   . Chronic diastolic heart failure ([place])   . Stage 3 chronic kidney disease   . Hypokalemia   . Leukocytosis   . Acute blood loss anemia   . 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 Type 2 diabetes mellitus with peripheral neuropathy (HCC)   . Hyperlipidemia   . Gait disturbance, post-stroke   . CKD (chronic kidney disease), stage III 11/02/2016  . Normocytic anemia 11/02/2016  . Ataxia 11/02/2016  . Ischemic stroke ([place]) 11/02/2016  . Puncture wound of right foot 11/02/2016  . Chronic diastolic CHF (congestive heart failure) ([place]) 11/02/2016  . Chronic left-sided low back pain 11/02/2016  . Demand ischemia ([place])   . ARF (acute renal failure) ([place])   . Hypertensive urgency 03/29/2016  . Abnormal electrocardiogram 05/24/2011  . Chest pain 05/24/2011  . Hypertension 05/24/2011  . Tobacco abuse 05/24/2011  . DM (diabetes mellitus), type 2 with renal complications ([place]) 41/74/0814   Past Medical History:  Past Medical History:  Diagnosis Date  . Asthma   . DM (diabetes mellitus) ([place])   . HTN (hypertension)   . RBBB (right bundle branch block)    Past Surgical History:  Past Surgical History:  Procedure Laterality Date  . BACK SURGERY    . HAND SURGERY      Assessment / Plan / Recommendation Clinical Impression Patient is a 61 y.o.right handed malewith history of asthma, diabetes mellitus,  hypertension, right bundle branch block, tobacco abuse, CKD stage III, chronic diastolic congestive heart failure. 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 One level home. Brother recently had CABG. Presented 11/02/2016 with dizziness as well as gait disturbance and left-sided numbness over the past 4 days as well as reported fall. Patient also reported that he had stepped on a nail with his right foot. CT/MRI showed patchy multifocal acute ischemic infarcts involving the superior left cerebellar hemisphere and left midbrain/pons. No associated hemorrhage or mass effect. Scattered remote lacunar infarcts involving the bilateral basal ganglia corona radiata as well as left thalamus. 3 mm pituitary lesion indeterminate. MRA negative for large vessel occlusion. Patient did not receive TPA. Echocardiogram with ejection fraction 65% and grade 1 diastolic dysfunction.Carotid Dopplers with no ICA stenosis.Neurology consulted presently on Plavix for CVA prophylaxis. Tolerating a regular diet. Subcutaneous heparin added for DVT prophylaxis. Physical and occupationaltherapy evaluation completed 11/03/2016 with recommendations of physical medicine rehabilitation consult.Patient was admitted for a comprehensive rehabilitation program 11/04/16.  Patient administered the MoCA-Version 7.3 and scored 28/30 points with a score of 26 or above considered normal. 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 Patient would benefit from skilled SLP intervention to maximize his cognitive function in order for patient to return to his baseline level of cognitive functioning.  Skilled Therapeutic Interventions          Administered a cognitive-linguistic  evaulation. Please see above for details. SLP also facilitated session by providing extra time and Min A-Supervision in the form of multiple repetitions for divided attention in regards to auditory recall and transcribing functional information. Patient also attempted to use the bathroom without assistance and required supervision verbal cues for safety awareness. Patient left sitting EOB with all needs within reach. Continue with current plan of care.    SLP Assessment  Patient will need skilled [facility] during CIR admission    Recommendations  Oral Care Recommendations: Oral care BID Patient destination: Home Follow up Recommendations: None Equipment Recommended: None recommended by SLP    SLP Frequency 1 to 3 out of 7 days   SLP Duration  SLP Intensity  SLP Treatment/Interventions ~1 week  Minumum of 1-2 x/day, 30 to 90 minutes  Cognitive remediation/compensation;Cueing hierarchy;Functional tasks;Patient/family education;Therapeutic Activities;Environmental controls    Pain No/Denies Pain   Function:   Cognition Comprehension Comprehension assist level: Follows complex conversation/direction with extra time/assistive device  Expression   Expression assist level: Expresses complex ideas: With extra time/assistive device  Social Interaction Social Interaction assist level: Interacts appropriately with others with medication or extra time (anti-anxiety, antidepressant).  Problem Solving Problem solving assist level: Solves complex problems: With extra time  Memory Memory assist level: Recognizes or recalls 90% of the time/requires cueing < 10% of the time   Short Term Goals: Week 1: SLP Short Term Goal 1 (Week 1): STGs=LTGs  Refer to Care Plan for Long Term Goals  Recommendations for other services: None   Discharge Criteria: Patient will be discharged from SLP if patient refuses treatment 3 consecutive times without medical reason, if treatment  goals not met, if there is a change in medical status, if patient makes no progress towards goals or if patient is discharged from hospital.  The above assessment, treatment plan, treatment alternatives and goals were discussed and mutually agreed upon: by patient  Key Cen 11/08/2016, 3:34 PM

## 2016-11-08 NOTE — Progress Notes (Signed)
Physical Therapy Session Note  Patient Details  Name: John Jacobson MRN: [id number] Date of Birth: Jun 20, 1955  Today's Date: 11/08/2016 PT Individual Time: 7939-0300 PT Individual Time Calculation (min): 30 min   Short Term Goals: Week 1:  PT Short Term Goal 1 (Week 1): Pt will ambulate 150 ft with least restrictive assistive device and supervision PT Short Term Goal 2 (Week 1): Pt will perform bed to chair transfer with least restrictive device and supervision PT Short Term Goal 3 (Week 1): Pt will ascend and descend 1 step to landing with least restrictive device and supervision  Skilled Therapeutic Interventions/Progress Updates:    no c/o pain.  Session focus on gait with SBQC and standing balance during bimanual UE table top task.    Pt ambulates to therapy gym with RW and distant supervision.  Gait with SBQC x100' +150' with initial min assist fade to min guard>supervision with practice.  Pt with increased unsteadiness but able to correct LOB with stepping strategy with min guard from PT.    Static stance on foam surface during bimanual table top task focus on visual scanning and problem solving.  Pt returned to room at end of session and positioned EOB with call bell in reach and needs met.   Therapy Documentation Precautions:  Precautions Precautions: Fall Precaution Comments:  (Monitor BP) Restrictions Weight Bearing Restrictions: No   See Function Navigator for Current Functional Status.   Therapy/Group: Individual Therapy  [name] 11/08/2016, 4:49 PM

## 2016-11-08 NOTE — Progress Notes (Signed)
[facility] PHYSICAL MEDICINE & REHABILITATION     PROGRESS NOTE  Subjective/Complaints:  Pt seen laying in bed this AM.  He slept well overnight.  He wants to know the results of his carotid ultasound.   ROS: Denies CP, SOB, N/V/D.  Objective: Vital Signs: Blood pressure (!) 165/74, pulse (!) 55, temperature 98.1 F (36.7 C), temperature source Oral, resp. rate 18, height 5\' 6"  (1.676 m), weight 81.8 kg (180 lb 4.8 oz), SpO2 99 %. No results found.  Recent Labs  11/07/16 1322  WBC 11.8*  HGB 10.4*  HCT 32.4*  PLT 193    Recent Labs  11/07/16 1322  NA 134*  K 4.6  CL 101  GLUCOSE 272*  BUN 34*  CREATININE 2.40*  CALCIUM 8.6*   CBG (last 3)   Recent Labs  11/07/16 1636 11/07/16 2045 11/08/16 0626  GLUCAP 156* 220* 243*    Wt Readings from Last 3 Encounters:  11/08/16 81.8 kg (180 lb 4.8 oz)  11/04/16 80.1 kg (176 lb 9.6 oz)  04/02/16 74.3 kg (163 lb 11.2 oz)    Physical Exam:  BP (!) 165/74 (BP Location: Left Arm)   Pulse (!) 55   Temp 98.1 F (36.7 C) (Oral)   Resp 18   Ht 5\' 6"  (1.676 m)   Wt 81.8 kg (180 lb 4.8 oz)   SpO2 99%   BMI 29.10 kg/m  Constitutional: He appears well-developed. NAD. HENT: Normocephalicand atraumatic.  Eyes: EOMI. No discharge.  Cardiovascular: RRR. No JVD. Respiratory: Breath sounds normal. Unlabored.  GI: Soft. Bowel sounds are normal. Musc: Left hand intrinsic atrophy.  Neurological: He is alertand oriented.  Follows simple commands.  Reasonable insight and awarenes Ataxia left arm and leg. Motor 5/5 proximal to distal RUE and RLE.  4+/5 proximal to distal LUE and LLE  Psychiatric: He has a normal mood and affect. His behavior is normal.  Skin. Warm and dry  Assessment/Plan: 1. Functional deficits secondary to left cerebellar, midbrain and pontine infarct which require 3+ hours per day of interdisciplinary therapy in a comprehensive inpatient rehab setting. Physiatrist is providing close team supervision and  24 hour management of active medical problems listed below. Physiatrist and rehab team continue to assess barriers to discharge/monitor patient progress toward functional and medical goals.  Function:  Bathing Bathing position      Bathing parts Body parts bathed by patient: Right arm, Left arm, Chest, Abdomen, Front perineal area, Buttocks, Right upper leg, Left upper leg, Right lower leg, Left lower leg Body parts bathed by helper: Back  Bathing assist Assist Level: Supervision or verbal cues      Upper Body Dressing/Undressing Upper body dressing   What is the patient wearing?: Pull over shirt/dress     Pull over shirt/dress - Perfomed by patient: Thread/unthread right sleeve, Thread/unthread left sleeve, Put head through opening, Pull shirt over trunk          Upper body assist Assist Level: Supervision or verbal cues      Lower Body Dressing/Undressing Lower body dressing   What is the patient wearing?: Underwear, Pants, Socks Underwear - Performed by patient: Thread/unthread right underwear leg, Thread/unthread left underwear leg, Pull underwear up/down   Pants- Performed by patient: Thread/unthread right pants leg, Thread/unthread left pants leg, Pull pants up/down       Socks - Performed by patient: Don/doff right sock, Don/doff left sock                Lower body  assist Assist for lower body dressing: Supervision or verbal cues      Toileting Toileting   Toileting steps completed by patient: Adjust clothing prior to toileting, Performs perineal hygiene, Adjust clothing after toileting      Toileting assist Assist level: Supervision or verbal cues   Transfers Chair/bed transfer   Chair/bed transfer method: Ambulatory Chair/bed transfer assist level: Touching or steadying assistance (Pt > 75%) Chair/bed transfer assistive device: Bedrails, Armrests, Medical sales representative     Max distance:  (150 ft) Assist level: Supervision or verbal  cues   Wheelchair     Max wheelchair distance:  (100 ft) Assist Level: Touching or steadying assistance (Pt > 75%)  Cognition Comprehension Comprehension assist level: Follows complex conversation/direction with extra time/assistive device  Expression Expression assist level: Expresses complex ideas: With extra time/assistive device  Social Interaction Social Interaction assist level: Interacts appropriately with others with medication or extra time (anti-anxiety, antidepressant).  Problem Solving Problem solving assist level: Solves complex problems: With extra time  Memory Memory assist level: Recognizes or recalls 75 - 89% of the time/requires cueing 10 - 24% of the time    Medical Problem List and Plan: 1. Left hemiataxia and gait disturbancesecondary to left cerebellar, midbrain and pontine infarcts  Cont CIR 2. DVT Prophylaxis/Anticoagulation: Subcutaneous heparin. Monitor platelet counts and any signs of bleeding 3. Pain Management/chronic back pain: Oxycodone as needed 4. Mood: Provide emotional support 5. Neuropsych: This patient iscapable of making decisions on hisown behalf. 6. Skin/Wound Care: Routine skin checks 7. Fluids/Electrolytes/Nutrition: Routine I&Os 8.Diabetes mellitus and peripheral neuropathy and retinopathy.   Hemoglobin A1c 8.6. Lantus insulin 10 units daily at bedtime. Glucotrol 10 mg daily Check CBGs before meals and at bedtime. Diabetic teaching   Eye injections as outpt  Increased in last 24 hours, will cont to monitor 9.Hypertension with history of right bundle branch block. Clonidine 0.3 mg 3 times a day, Lasix 40 mg daily, hydralazine 100 mg 3 times a day  Cardura 2 mg twice a day   Cozaar 50 mg daily, increased to 75 on 5/28.   Monitor with increased mobility  Will order renal artery U/S  Will consider Nephro consult 10.Chronic diastolic congestive heart failure. Continue Lasix. Monitor for any signs of fluid overload Filed Weights    11/06/16 0504 11/07/16 0530 11/08/16 0500  Weight: 81.4 kg (179 lb 7.2 oz) 81.3 kg (179 lb 3.3 oz) 81.8 kg (180 lb 4.8 oz)  11.CKD stage III. With AKI  Cr 2.4 on 5/28  Will order IVF  Labs ordered for tomorrow  Encourage fluids 12.Hypokalemia.   K+ 4.6 on 5/28 13.Tobacco abuse. Counseling 14.Constipation. Laxative assistance 15. Hep C  Cont Zepatier x2 weeks. 16. Leukocytosis  WBCs 11.8 on 5/28  Afebrile  Cont to monitor 17. ABLA  Hb 10.4 on 5/28  LOS (Days) 4 A FACE TO FACE EVALUATION WAS PERFORMED  Ankit Lorie Phenix 11/08/2016 8:44 AM

## 2016-11-08 NOTE — Consult Note (Signed)
Reason for Consult: Uncontrolled hypertension, acute kidney injury on chronic kidney disease stage III Referring Physician: Delice Lesch M.D.  HPI:  61 year old African-American man with past medical history significant for hypertension, type 2 diabetes mellitus, chronic diastolic heart failure, right bundle branch block and bronchial asthma who was admitted to the hospital 6 days ago with complaints of loss of coordination, dizziness and problems ambulating after he stepped on a nail with his right foot. He appears to have baseline chronic kidney disease stage III with a creatinine ranging 1.7-1.9. He denied any other constitutional complaints such as fevers or chills and did not have any chest pain, palpitations, headache or focal weakness or numbness. 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  Concern is raised with difficulties in controlling his blood pressure corresponding with rising creatinine that is now up to 2.4. He reports a history of chronic kidney disease for which he follows up with nephrology at the [facility] and [place] clinics (baseline kidney function~39%). He denies any obstructive or irritative urinary symptoms at this time and does not have any nausea, vomiting or diarrhea.  He was born and raised in [place] and graduated from [employer] after which joined the [employer] and served in the [employer] for a few years. 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 Recently, he has been working as a Location manager for [employer].  Past Medical History:  Diagnosis Date  . Asthma   . DM (diabetes mellitus) ([place])   . HTN (hypertension)   . RBBB (right bundle branch  block)     Past Surgical History:  Procedure Laterality Date  . BACK SURGERY    . HAND SURGERY      Family History  Problem Relation Age of Onset  . Coronary artery disease Mother        MI in her 40s  . Hypertension Unknown   . Diabetes Unknown   . Alzheimer's disease Unknown     Social History:  reports that he has been smoking.  He has never used smokeless tobacco. He reports that he drinks alcohol. He reports that he does not use drugs.  Allergies:  Allergies  Allergen Reactions  . Amlodipine Besy-Benazepril Hcl Shortness Of Breath and Swelling    Mouth and tongue swelling  . Shellfish Allergy Anaphylaxis    Medications:  Scheduled: . aspirin EC  325 mg Oral Daily  . atorvastatin  20 mg Oral q1800  . cloNIDine  0.3 mg Oral TID  . clopidogrel  75 mg Oral Daily  . doxazosin  4 mg Oral QHS  . Elbasvir-Grazoprevir  1 each Oral Daily  . furosemide  40 mg Oral Daily  . glipiZIDE  10 mg Oral QAC breakfast  . heparin  5,000 Units Subcutaneous Q8H  . hydrALAZINE  100 mg Oral TID  . hydrocerin  1 application Topical Daily  . insulin aspart  0-15 Units Subcutaneous TID WC  . insulin aspart  3 Units Subcutaneous TID WC  . insulin glargine  10 Units Subcutaneous QHS  . [START ON 11/09/2016] losartan  100 mg Oral Daily  . polyethylene glycol  17 g Oral Daily  . saccharomyces boulardii  250 mg Oral BID  . senna  1 tablet Oral Daily    BMP Latest Ref Rng & Units 11/07/2016 11/04/2016 11/03/2016  Glucose 65 - 99 mg/dL 272(H) 93 200(H)  BUN 6 - 20 mg/dL 34(H) 21(H) 19  Creatinine 0.61 - 1.24 mg/dL 2.40(H) 1.91(H) 1.83(H)  Sodium 135 - 145 mmol/L 134(L) 138 136  Potassium 3.5 - 5.1 mmol/L 4.6 4.2 3.3(L)  Chloride 101 - 111 mmol/L 101 105 104  CO2 22 - 32 mmol/L 23 25 26   Calcium 8.9 - 10.3 mg/dL 8.6(L) 8.7(L) 8.3(L)   CBC Latest Ref Rng & Units 11/07/2016 11/04/2016 11/03/2016  WBC 4.0 - 10.5 K/uL 11.8(H) 11.5(H) 10.8(H)  Hemoglobin 13.0 - 17.0 g/dL 10.4(L) 11.3(L) 10.5(L)   Hematocrit 39.0 - 52.0 % 32.4(L) 34.4(L) 31.6(L)  Platelets 150 - 400 K/uL 193 192 159    No results found.  Review of Systems  Constitutional: Negative.   HENT: Negative.   Eyes: Negative.   Respiratory: Negative.   Cardiovascular: Negative.   Gastrointestinal: Negative.   Genitourinary: Negative.   Skin: Negative.   Neurological:       Staggering when walking and having problems with co-ordination   Blood pressure (!) 165/74, pulse (!) 55, temperature 98.1 F (36.7 C), temperature source Oral, resp. rate 18, height 5\' 6"  (1.676 m), weight 81.8 kg (180 lb 4.8 oz), SpO2 99 %. Physical Exam  Nursing note and vitals reviewed. Constitutional: He is oriented to person, place, and time. He appears well-developed and well-nourished. No distress.  HENT:  Head: Normocephalic and atraumatic.  Mouth/Throat: Oropharynx is clear and moist.  Eyes: Conjunctivae and EOM are normal. Pupils are equal, round, and reactive to light. No scleral icterus.  Neck: Normal range of motion. Neck supple. No JVD present. No thyromegaly present.  Cardiovascular: Normal rate, regular rhythm and normal heart sounds.  Exam reveals no friction rub.   No murmur heard. Respiratory: Effort normal and breath sounds normal. He has no wheezes. He has no rales.  GI: Soft. Bowel sounds are normal. There is no tenderness. There is no rebound and no guarding.  Musculoskeletal: Normal range of motion.  Neurological: He is alert and oriented to person, place, and time. No cranial nerve deficit.  Skin: Skin is warm and dry. No rash noted. No erythema.  Psychiatric: He has a normal mood and affect. His behavior is normal.    Assessment/Plan: 1. Uncontrolled hypertension: Will evaluate for renal artery stenosis with Dopplers to see if indeed this may be a secondary hypertension given difficulty in control. Increase losartan to 100 mg daily and switch doxazosin to 4 mg daily at bedtime. Continue hydralazine, clonidine and  furosemide at current dose. His heartrate is not permissive to initiation of beta blocker therapy however, this may possibly be used by sequentially down titrating clonidine to limit its negative chronotropic effect. He is already on a sodium restricted diet and clinically does not appear to be hypervolemic to prompt increasing furosemide dose. In the past, he has had over appears to be angioedema from benazepril and developed bilateral hand swelling with exfoliation of skin from amlodipine. 2. Acute kidney injury on chronic kidney disease stage III: Most likely hemodynamically mediated with recent fluctuating blood pressures as well as adjustment of ARB dosing. Continue to follow with daily labs-electrolytes within acceptable limits. 3. Ataxia with left cerebellar infarcts: Ongoing inpatient rehabilitation  4. Anemia: Possibly anemia of chronic kidney disease, check iron studies and decide on need for ESA therapy if hemoglobin drifts <10 g/deciliter.   Ruger Saxer K. 11/08/2016, 1:36  PM

## 2016-11-08 NOTE — Progress Notes (Signed)
Physical Therapy Session Note  Patient Details  Name: John Jacobson MRN: [id number] Date of Birth: 09/21/1955  Today's Date: 11/08/2016 PT Individual Time: 0916-1000 PT Individual Time Calculation (min): 44 min   Short Term Goals: Week 1:  PT Short Term Goal 1 (Week 1): Pt will ambulate 150 ft with least restrictive assistive device and supervision PT Short Term Goal 2 (Week 1): Pt will perform bed to chair transfer with least restrictive device and supervision PT Short Term Goal 3 (Week 1): Pt will ascend and descend 1 step to landing with least restrictive device and supervision  Skilled Therapeutic Interventions/Progress Updates:    Pt sitting at EOB upon arrival, agreeable to PT session. Pt donning socks and shoes with supervision. Requesting to use bathroom prior to leaving room. Pt ambulating into room with supervision using rw. Pt independent with pulling pants down and supervision for standing. Stopping to was hands and face prior to leaving room. Transfers: supervision with cues needed X2 for hand placement and keeping rw with him prior to sitting. Ambulation: 210 ft X1, 120 ft X1 ft f/b shorter ambulation throughout session. Ambulation performed with supervision using rw. Balance: standing cone taps bilaterally with progression to across midline, staggered stance static and progression to arm motions, head turns in standing. Pt returning to room, requests returning to bed to rest. Pt in bed with all needs in reach.   Therapy Documentation Precautions:  Precautions Precautions: Fall Precaution Comments:  (Monitor BP) Restrictions Weight Bearing Restrictions: No   Vital Signs: BP 172/90 Pain: Pain Assessment Pain Score: 0-No pain  See Function Navigator for Current Functional Status.   Therapy/Group: Individual Therapy  Linard Millers, PT 11/08/2016, 12:58 PM

## 2016-11-09 ENCOUNTER — Inpatient Hospital Stay (HOSPITAL_COMMUNITY): Payer: Medicaid Other | Admitting: Occupational Therapy

## 2016-11-09 ENCOUNTER — Inpatient Hospital Stay (HOSPITAL_COMMUNITY): Payer: Medicaid Other

## 2016-11-09 ENCOUNTER — Inpatient Hospital Stay (HOSPITAL_COMMUNITY): Payer: Medicaid Other | Admitting: Speech Pathology

## 2016-11-09 ENCOUNTER — Inpatient Hospital Stay (HOSPITAL_COMMUNITY): Payer: Medicaid Other | Admitting: Physical Therapy

## 2016-11-09 DIAGNOSIS — I10 Essential (primary) hypertension: Secondary | ICD-10-CM

## 2016-11-09 LAB — CBC WITH DIFFERENTIAL/PLATELET
Basophils Absolute: 0 10*3/uL (ref 0.0–0.1)
Basophils Relative: 0 %
Eosinophils Absolute: 0.4 10*3/uL (ref 0.0–0.7)
Eosinophils Relative: 4 %
HEMATOCRIT: 32.8 % — AB (ref 39.0–52.0)
HEMOGLOBIN: 10.7 g/dL — AB (ref 13.0–17.0)
LYMPHS ABS: 3.1 10*3/uL (ref 0.7–4.0)
LYMPHS PCT: 31 %
MCH: 29.5 pg (ref 26.0–34.0)
MCHC: 32.6 g/dL (ref 30.0–36.0)
MCV: 90.4 fL (ref 78.0–100.0)
MONOS PCT: 9 %
Monocytes Absolute: 0.9 10*3/uL (ref 0.1–1.0)
NEUTROS ABS: 5.5 10*3/uL (ref 1.7–7.7)
NEUTROS PCT: 56 %
Platelets: 224 10*3/uL (ref 150–400)
RBC: 3.63 MIL/uL — ABNORMAL LOW (ref 4.22–5.81)
RDW: 14.1 % (ref 11.5–15.5)
WBC: 9.4 10*3/uL (ref 4.0–10.5)

## 2016-11-09 LAB — RENAL FUNCTION PANEL
ANION GAP: 7 (ref 5–15)
Albumin: 2.8 g/dL — ABNORMAL LOW (ref 3.5–5.0)
BUN: 35 mg/dL — ABNORMAL HIGH (ref 6–20)
CO2: 26 mmol/L (ref 22–32)
Calcium: 8.9 mg/dL (ref 8.9–10.3)
Chloride: 104 mmol/L (ref 101–111)
Creatinine, Ser: 2.03 mg/dL — ABNORMAL HIGH (ref 0.61–1.24)
GFR calc Af Amer: 39 mL/min — ABNORMAL LOW (ref >60)
GFR calc non Af Amer: 33 mL/min — ABNORMAL LOW (ref >60)
GLUCOSE: 154 mg/dL — AB (ref 65–99)
PHOSPHORUS: 3.9 mg/dL (ref 2.5–4.6)
Potassium: 4.1 mmol/L (ref 3.5–5.1)
Sodium: 137 mmol/L (ref 135–145)

## 2016-11-09 LAB — FERRITIN: Ferritin: 46 ng/mL (ref 24–336)

## 2016-11-09 LAB — IRON AND TIBC
Iron: 74 ug/dL (ref 45–182)
SATURATION RATIOS: 23 % (ref 17.9–39.5)
TIBC: 323 ug/dL (ref 250–450)
UIBC: 249 ug/dL

## 2016-11-09 LAB — GLUCOSE, CAPILLARY
GLUCOSE-CAPILLARY: 141 mg/dL — AB (ref 65–99)
GLUCOSE-CAPILLARY: 139 mg/dL — AB (ref 65–99)
GLUCOSE-CAPILLARY: 182 mg/dL — AB (ref 65–99)
Glucose-Capillary: 136 mg/dL — ABNORMAL HIGH (ref 65–99)

## 2016-11-09 MED ORDER — SODIUM CHLORIDE 0.9 % IV SOLN
510.0000 mg | Freq: Once | INTRAVENOUS | Status: AC
  Administered 2016-11-09: 510 mg via INTRAVENOUS
  Filled 2016-11-09: qty 17

## 2016-11-09 NOTE — Progress Notes (Signed)
*  PRELIMINARY RESULTS* Vascular Ultrasound Renal Artery Duplex has been completed.  No obvious renal artery stenosis identified on this exam. Somewhat liminted exam due to bowel and gas.   Everrett Coombe 11/09/2016, 4:54 PM

## 2016-11-09 NOTE — Progress Notes (Signed)
Physical Therapy Session Note  Patient Details  Name: John Jacobson MRN: [id number] Date of Birth: 03-20-1956  Today's Date: 11/09/2016 PT Individual Time: 9584-4171 PT Individual Time Calculation (min): 29 min   Short Term Goals: Week 1:  PT Short Term Goal 1 (Week 1): Pt will ambulate 150 ft with least restrictive assistive device and supervision PT Short Term Goal 2 (Week 1): Pt will perform bed to chair transfer with least restrictive device and supervision PT Short Term Goal 3 (Week 1): Pt will ascend and descend 1 step to landing with least restrictive device and supervision  Skilled Therapeutic Interventions/Progress Updates:   Pt received sitting EOB and agreeable to PT.   Pt instructed in transfer training from various surface heights with supervision assist from PT.   Gait training with RW 2x116f and distant supervision assist from PT. Min cues for safety with turn to sitting on EOB and bed.   Qped NMR : Reciprocal shoulder flexion x 10 BUE  Reciprocal donkey kicks x 8 BLE. PT required to stabilize pelvis on with RLE movement due to L sided trunkal ataxia. '  Floor transfer with supervision assist from PT. Education from PT for when to perform transfer and safety precautions for when to notify emergency medical services. Moderate cues for LE positioning and sequencing for safety and improved efficiency of movement.   Patient returned too room and left sitting EOB with call bell in reach and all needs met.         Therapy Documentation Precautions:  Precautions Precautions: Fall Precaution Comments:  (Monitor BP) Restrictions Weight Bearing Restrictions: No Vital Signs: Therapy Vitals Temp: 97.5 F (36.4 C) Temp Source: Oral Pulse Rate: (!) 59 Resp: 18 BP: (!) 159/77 Patient Position (if appropriate): Sitting Oxygen Therapy SpO2: 100 % O2 Device: Not Delivered Pain: Pain Assessment Pain Assessment: No/denies pain   See Function Navigator for Current  Functional Status.   Therapy/Group: Individual Therapy  ALorie Phenix5/30/2018, 3:17 PM

## 2016-11-09 NOTE — Progress Notes (Signed)
[facility] PHYSICAL MEDICINE & REHABILITATION     PROGRESS NOTE  Subjective/Complaints:  Pt seen sitting up in bed this AM.  He is on the phone throughout exam.  Reviewed results of carotid U/s with pt as well as stroke etiology.   ROS: Denies CP, SOB, N/V/D.  Objective: Vital Signs: Blood pressure (!) 153/76, pulse (!) 58, temperature 97.7 F (36.5 C), temperature source Oral, resp. rate 19, height 5\' 6"  (1.676 m), weight 82.4 kg (181 lb 10.5 oz), SpO2 97 %. No results found.  Recent Labs  11/07/16 1322 11/09/16 0500  WBC 11.8* 9.4  HGB 10.4* 10.7*  HCT 32.4* 32.8*  PLT 193 224    Recent Labs  11/07/16 1322 11/09/16 0500  NA 134* 137  K 4.6 4.1  CL 101 104  GLUCOSE 272* 154*  BUN 34* 35*  CREATININE 2.40* 2.03*  CALCIUM 8.6* 8.9   CBG (last 3)   Recent Labs  11/08/16 1636 11/08/16 2104 11/09/16 0700  GLUCAP 102* 188* 141*    Wt Readings from Last 3 Encounters:  11/09/16 82.4 kg (181 lb 10.5 oz)  11/04/16 80.1 kg (176 lb 9.6 oz)  04/02/16 74.3 kg (163 lb 11.2 oz)    Physical Exam:  BP (!) 153/76 (BP Location: Left Arm)   Pulse (!) 58   Temp 97.7 F (36.5 C) (Oral)   Resp 19   Ht 5\' 6"  (1.676 m)   Wt 82.4 kg (181 lb 10.5 oz)   SpO2 97%   BMI 29.32 kg/m  Constitutional: He appears well-developed. NAD. HENT: Normocephalicand atraumatic.  Eyes: EOMI. No discharge.  Cardiovascular: RRR. No JVD. Respiratory: Breath sounds normal. Unlabored.  GI: Soft. Bowel sounds are normal. Musc: Left hand intrinsic atrophy.  Neurological: He is alertand oriented.  Follows simple commands.  Reasonable insight and awarenes Ataxia left arm and leg. Motor 5/5 proximal to distal RUE and RLE.  4+/5 proximal to distal LUE and LLE (stable) Psychiatric: He has a normal mood and affect. His behavior is normal.  Skin. Warm and dry  Assessment/Plan: 1. Functional deficits secondary to left cerebellar, midbrain and pontine infarct which require 3+ hours per day of  interdisciplinary therapy in a comprehensive inpatient rehab setting. Physiatrist is providing close team supervision and 24 hour management of active medical problems listed below. Physiatrist and rehab team continue to assess barriers to discharge/monitor patient progress toward functional and medical goals.  Function:  Bathing Bathing position      Bathing parts Body parts bathed by patient: Right arm, Left arm, Chest, Abdomen, Front perineal area, Buttocks, Right upper leg, Left upper leg, Right lower leg, Left lower leg Body parts bathed by helper: Back  Bathing assist Assist Level: Supervision or verbal cues      Upper Body Dressing/Undressing Upper body dressing   What is the patient wearing?: Pull over shirt/dress     Pull over shirt/dress - Perfomed by patient: Thread/unthread right sleeve, Thread/unthread left sleeve, Put head through opening, Pull shirt over trunk          Upper body assist Assist Level: Supervision or verbal cues      Lower Body Dressing/Undressing Lower body dressing   What is the patient wearing?: Underwear, Pants, Socks Underwear - Performed by patient: Thread/unthread right underwear leg, Thread/unthread left underwear leg, Pull underwear up/down   Pants- Performed by patient: Thread/unthread right pants leg, Thread/unthread left pants leg, Pull pants up/down       Socks - Performed by patient: Don/doff right  sock, Don/doff left sock                Lower body assist Assist for lower body dressing: Supervision or verbal cues      Toileting Toileting   Toileting steps completed by patient: Adjust clothing prior to toileting, Performs perineal hygiene, Adjust clothing after toileting      Toileting assist Assist level: Supervision or verbal cues   Transfers Chair/bed transfer   Chair/bed transfer method: Ambulatory Chair/bed transfer assist level: Supervision or verbal cues Chair/bed transfer assistive device: Armrests, Environmental health practitioner     Max distance: 210 ft Assist level: Supervision or verbal cues   Wheelchair     Max wheelchair distance:  (100 ft) Assist Level: Touching or steadying assistance (Pt > 75%)  Cognition Comprehension Comprehension assist level: Follows complex conversation/direction with extra time/assistive device  Expression Expression assist level: Expresses complex ideas: With extra time/assistive device  Social Interaction Social Interaction assist level: Interacts appropriately with others with medication or extra time (anti-anxiety, antidepressant).  Problem Solving Problem solving assist level: Solves complex problems: With extra time  Memory Memory assist level: Recognizes or recalls 90% of the time/requires cueing < 10% of the time    Medical Problem List and Plan: 1. Left hemiataxia and gait disturbancesecondary to left cerebellar, midbrain and pontine infarcts  Cont CIR 2. DVT Prophylaxis/Anticoagulation: Subcutaneous heparin. Monitor platelet counts and any signs of bleeding 3. Pain Management/chronic back pain: Oxycodone as needed 4. Mood: Provide emotional support 5. Neuropsych: This patient iscapable of making decisions on hisown behalf. 6. Skin/Wound Care: Routine skin checks 7. Fluids/Electrolytes/Nutrition: Routine I&Os 8.Diabetes mellitus and peripheral neuropathy and retinopathy.   Hemoglobin A1c 8.6. Lantus insulin 10 units daily at bedtime. Glucotrol 10 mg daily Check CBGs before meals and at bedtime. Diabetic teaching   Eye injections as outpt  Labile at present  Will cont to monitor 9.Hypertension with history of right bundle branch block. Clonidine 0.3 mg 3 times a day, Lasix 40 mg daily, hydralazine 100 mg 3 times a day  Cardura 4mg  qhs  Cozaar 50 mg daily, increased to 75 on 5/28, increased to 100 on 5/29.   Monitor with increased mobility  Renal artery U/S pending  Nephro consulted, appreciate recs  With orthostasis 10.Chronic  diastolic congestive heart failure. Continue Lasix. Monitor for any signs of fluid overload Filed Weights   11/07/16 0530 11/08/16 0500 11/09/16 0430  Weight: 81.3 kg (179 lb 3.3 oz) 81.8 kg (180 lb 4.8 oz) 82.4 kg (181 lb 10.5 oz)  11.CKD stage III. With AKI  Cr 2.03 on 5/30  Encourage fluids 12.Hypokalemia.   K+ 4.1 on 5/30 13.Tobacco abuse. Counseling 14.Constipation. Laxative assistance 15. Hep C  Cont Zepatier x2 weeks. 16. Leukocytosis  WBCs 9.4 on 5/30  Afebrile  Cont to monitor 17. ABLA  Hb 10.7 on 5/30  LOS (Days) 5 A FACE TO FACE EVALUATION WAS PERFORMED  Ankit Lorie Phenix 11/09/2016 8:34 AM

## 2016-11-09 NOTE — Progress Notes (Addendum)
Physical Therapy Session Note  Patient Details  Name: John Jacobson MRN: [id number] Date of Birth: 1956-02-17  Today's Date: 11/09/2016 PT Individual Time: 1515-1600 PT Individual Time Calculation (min): 45 min   Short Term Goals: Week 1:  PT Short Term Goal 1 (Week 1): Pt will ambulate 150 ft with least restrictive assistive device and supervision PT Short Term Goal 2 (Week 1): Pt will perform bed to chair transfer with least restrictive device and supervision PT Short Term Goal 3 (Week 1): Pt will ascend and descend 1 step to landing with least restrictive device and supervision  Skilled Therapeutic Interventions/Progress Updates:    no c/o pain.  Session focus on attention, reaction time, and gait.  Pt ambulates to and from dayroom with RW mod I.  Pt states he prefers RW to [facility] at this time.  Dynavision mode D x2 trials focus on visual and motor reaction with rest break in between trials.  Pt requires mod fade to supervision cues for successful completion of task, noted decreased reaction time with LUE compared to RUE. Results below.  Pt returned to room at end of session and positioned in bed with call bell in reach and needs met.   Trial 1: (practice trial, pt changed from R hand to L hand half way through exercise)  Pass 1: 1.11 visual response, 0.82 motor response, 1.94 physical response Trial 2: (L hand only)  Pass 1: 0.64 visual response, 0.82 motor response, 1.46 physical response  Pass 2: 0.64 visual response, 1.3 motor response, 1.94 physical response    Therapy Documentation Precautions:  Precautions Precautions: Fall Precaution Comments:  (Monitor BP) Restrictions Weight Bearing Restrictions: No   See Function Navigator for Current Functional Status.   Therapy/Group: Individual Therapy  Vaani Morren E Penven-Crew 11/09/2016, 4:20 PM

## 2016-11-09 NOTE — Patient Care Conference (Signed)
Inpatient RehabilitationTeam Conference and Plan of Care Update Date: 11/09/2016   Time: 11:10 AM    Patient Name: John Jacobson      Medical Record Number: [id number]  Date of Birth: 30-Mar-1956 Sex: Male         Room/Bed: 4M01C/4M01C-01 Payor Info: Payor: MEDICAID [place] / Plan: MEDICAID [place] ACCESS / Product Type: *No Product type* /    Admitting Diagnosis: cva  Admit Date/Time:  11/04/2016  4:53 PM Admission Comments: No comment available   Primary Diagnosis:  <principal problem not specified> Principal Problem: <principal problem not specified>  Patient Active Problem List   Diagnosis Date Noted  . Resistant hypertension   . AKI (acute kidney injury) ([place])   . Brainstem infarct, acute ([place])   . Benign essential HTN   . Chronic diastolic heart failure ([place])   . Stage 3 chronic kidney disease   . Hypokalemia   . Leukocytosis   . Acute blood loss anemia   . 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 Cerebellar infarction ([place]) 11/04/2016  . Type 2 diabetes mellitus with peripheral neuropathy ([facility])   . Hyperlipidemia   . Gait disturbance, post-stroke   . CKD (chronic kidney disease), stage III 11/02/2016  . Normocytic anemia 11/02/2016  . Ataxia 11/02/2016  . Ischemic stroke ([place]) 11/02/2016  . Puncture wound of right foot 11/02/2016  . Chronic diastolic CHF (congestive heart failure) ([place]) 11/02/2016  . Chronic left-sided low back pain 11/02/2016  . Demand ischemia ([place])   . ARF (acute renal failure) ([place])   . Hypertensive urgency 03/29/2016  . Abnormal electrocardiogram 05/24/2011  . Chest pain 05/24/2011  . Hypertension 05/24/2011  . Tobacco abuse 05/24/2011  . DM (diabetes mellitus), type 2 with renal complications ([place]) 58/52/7782    Expected Discharge Date: Expected Discharge Date: 11/11/16  Team Members Present: Physician leading conference: Dr. Delice Lesch Social Worker Present: Ovidio Kin,  LCSW Nurse Present: Other (comment) Haywood Lasso Evans-RN) PT Present: Dwyane Dee, PT OT Present: Cherylynn Ridges, OT SLP Present: Weston Anna, SLP PPS Coordinator present : Daiva Nakayama, RN, CRRN     Current Status/Progress Goal Weekly Team Focus  Medical    Left hemiataxia and gait disturbance secondary to left cerebellar, midbrain and pontine infarcts  Improve cogntion, multiple medical issues  See above   Bowel/Bladder   continent of bowel and bladder; LBM 5/28  maintain continence with min assist  assess for changes in continence q shift and prn   Swallow/Nutrition/ Hydration             ADL's   Supervision overall  Mod I overall  Pt/family ed, modified bathing/dressing   Mobility   supervision   mod I  d/c planning, high level balance/ambulation   Communication             Safety/Cognition/ Behavioral Observations  Mod I  Mod I  recall and divided attention    Pain   denies pain  <3  assess pain q shift and prn   Skin   R second toe discolored  skin free from infection and breakdown  assess for changes in skin integrity q shift and prn      *See Care Plan and progress notes for long and short-term goals.  Barriers to Discharge: Higher cognitive tasks, DM, HTN, Hep C, CKD +AKI, ABLA    Possible Resolutions to Barriers:  Therapies, follow labs, optimize DM and HTN meds, Nephro consulted  Discharge Planning/Teaching Needs:  Home with his brother who is recovering from CABG, will need to be mod/i to go home with him      Team Discussion:  Goals mod/i level and supervision for tub and community ambulation. Pt doing well and according to MD medically stable. Will need OP therapies, will discuss with daughter. 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  Revisions to Treatment Plan:  DC 6/1   Continued Need for Acute Rehabilitation Level of Care: The patient requires daily medical management by a physician with specialized training in physical medicine and  rehabilitation for the following conditions: Daily direction of a multidisciplinary physical rehabilitation program to ensure safe treatment while eliciting the highest outcome that is of practical value to the patient.: Yes Daily medical management of patient stability for increased activity during participation in an intensive rehabilitation regime.: Yes Daily analysis of laboratory values and/or radiology reports with any subsequent need for medication adjustment of medical intervention for : Neurological problems;Renal problems;Diabetes problems;Blood pressure problems  Emberlin Verner, Gardiner Rhyme 11/09/2016, 1:31 PM

## 2016-11-09 NOTE — Progress Notes (Signed)
Physical Therapy Session Note  Patient Details  Name: John Jacobson MRN: [id number] Date of Birth: 1955/08/21  Today's Date: 11/09/2016 PT Individual Time: 1130-1155 PT Individual Time Calculation (min): 25 min   Short Term Goals: Week 1:  PT Short Term Goal 1 (Week 1): Pt will ambulate 150 ft with least restrictive assistive device and supervision PT Short Term Goal 2 (Week 1): Pt will perform bed to chair transfer with least restrictive device and supervision PT Short Term Goal 3 (Week 1): Pt will ascend and descend 1 step to landing with least restrictive device and supervision  Skilled Therapeutic Interventions/Progress Updates:    no c/o pain, session focus on balance and mobility.   Pt requesting to toilet at start of session, ambulates in room with RW supervision and performs 3/3 toilet steps and transfer with set up assist.  Gait to and from therapy gym with RW mod I.  Dynamic standing balance on 4" foam reaching for horse shoes.  Pt asking for theraputty and foam block for continued work on LUE while in room.  PT provided pt with yellow putty and green foam.  Pt returned to room at end of session and positioned in recliner with call bell in reach and needs met.  Therapy Documentation Precautions:  Precautions Precautions: Fall Precaution Comments:  (Monitor BP) Restrictions Weight Bearing Restrictions: No   See Function Navigator for Current Functional Status.   Therapy/Group: Individual Therapy  John Jacobson 11/09/2016, 11:56 AM

## 2016-11-09 NOTE — Progress Notes (Signed)
Speech Language Pathology Daily Session Note  Patient Details  Name: John Jacobson MRN: [id number] Date of Birth: 27-Jan-1956  Today's Date: 11/09/2016 SLP Individual Time: 1000-1030 SLP Individual Time Calculation (min): 30 min  Short Term Goals: Week 1: SLP Short Term Goal 1 (Week 1): STGs=LTGs  Skilled Therapeutic Interventions: Pt was seen for cognitive therapy in the am with in a semi-distracting environment. Pt completed visual scanning task with desktop computer using a Microsoft word document. Pt requires increased time to visually navigate the page. Pt was given auditory dictation tasks at the word, sentence and number level with 90% acc, given multiple repetitions from therapist and increased time for processing and correction of spelling errors. Pt was taken back to his room and asked to sit on the edge of the bed with call bell and walker in reach, with bed alarm on.      Function:  Eating Eating                 Cognition Comprehension Comprehension assist level: Follows complex conversation/direction with extra time/assistive device  Expression   Expression assist level: Expresses complex ideas: With extra time/assistive device  Social Interaction Social Interaction assist level: Interacts appropriately with others with medication or extra time (anti-anxiety, antidepressant).  Problem Solving Problem solving assist level: Solves complex problems: With extra time  Memory Memory assist level: Recognizes or recalls 90% of the time/requires cueing < 10% of the time    Pain Pain Assessment Pain Assessment: No/denies pain  Therapy/Group: Individual Therapy  Darlina Sicilian 11/09/2016, 2:20 PM

## 2016-11-09 NOTE — Progress Notes (Signed)
Occupational Therapy Session Note  Patient Details  Name: John Jacobson MRN: [id number] Date of Birth: 06-May-1956  Today's Date: 11/09/2016 OT Individual Time: 0900-1000 OT Individual Time Calculation (min): 60 min    Short Term Goals: Week 1:  OT Short Term Goal 1 (Week 1): STG = LTGs d/t ELOS  Skilled Therapeutic Interventions/Progress Updates:    OT treatment session focused on dc planning, functional mobility, transfer training, and dynamic standing balance. Pt ambulated to therapy apartment with supervision and RW. Discussed home bathroom set-up and environmental modifications for safety within home.  Ambulated into bathroom and demonstrated tub transfer using tub transfer bench, then demonstrated transfer with one shower chair. Pt required min A to maintain balance with step over tub and shower chair. He was supervision using tub transfer bench. Discussed importance of safety and use of tub transfer bench to decrease fall risk, pt agreed that tub bench would be his safest option. Pt then ambulated to therapy gym for balance training focused on hip and ankle strategies with dual task of playing connect 4. Pt returned to room at end of session w/ RW and supervision.    Therapy Documentation Precautions:  Precautions Precautions: Fall Precaution Comments:  (Monitor BP) Restrictions Weight Bearing Restrictions: No  Pain: Pain Assessment Pain Assessment: No/denies pain  See Function Navigator for Current Functional Status.   Therapy/Group: Individual Therapy  Valma Cava 11/09/2016, 10:04 AM

## 2016-11-09 NOTE — Progress Notes (Signed)
Patient ID: John Jacobson, male   DOB: 23-Jul-1955, 61 y.o.   MRN: [id number]  [facility] Progress Note   Assessment/ Plan:   1. Uncontrolled hypertension: Blood pressure remains elevated with significant orthostatic drop that at this time limits aggressiveness of lowering therapy. Plan to continue to monitor him and review results from renal artery Dopplers to see if further intervention is warranted in that regard. Contemplating a trial of felodipine or nifedipine XL while here in the hospital as this would be ideal for his systolic elevation of blood pressure. Close monitoring to be undertaken for any sort of cutaneous reaction. 2. Acute kidney injury on chronic kidney disease stage III: Creatinine appears to have improved back to his baseline range-recently uptitrated ARB, continue daily labs. 3. Ataxia with left cerebellar infarcts: Ongoing inpatient rehabilitation  4. Anemia: Possibly anemia of chronic kidney disease, iron stores borderline-will give intravenous iron.   Subjective:   Reports to be feeling fair-still having some dizziness and problems with ambulation    Objective:   BP (!) 153/76 (BP Location: Left Arm)   Pulse (!) 58   Temp 97.7 F (36.5 C) (Oral)   Resp 19   Ht 5\' 6"  (1.676 m)   Wt 82.4 kg (181 lb 10.5 oz)   SpO2 97%   BMI 29.32 kg/m   Physical Exam: [id number]:[id number] sitting on the edge of his bed CVS: Pulse regular bradycardia, S1 and S2 normal Resp: Clear to auscultation, no rales/rhonchi Abd: Soft, flat, nontender Ext: No lower extremity edema  Labs: BMET  Recent Labs Lab 11/02/16 1841 11/03/16 0629 11/04/16 1416 11/07/16 1322 11/09/16 0500  NA 134* 136 138 134* 137  K 3.6 3.3* 4.2 4.6 4.1  CL 102 104 105 101 104  CO2 23 26 25 23 26   GLUCOSE 197* 200* 93 272* 154*  BUN 18 19 21* 34* 35*  CREATININE 1.78* 1.83* 1.91* 2.40* 2.03*  CALCIUM 8.6* 8.3* 8.7* 8.6* 8.9  PHOS  --   --   --   --  3.9   CBC  Recent Labs Lab  11/03/16 0629 11/04/16 1416 11/07/16 1322 11/09/16 0500  WBC 10.8* 11.5* 11.8* 9.4  NEUTROABS 6.0  --  7.8* 5.5  HGB 10.5* 11.3* 10.4* 10.7*  HCT 31.6* 34.4* 32.4* 32.8*  MCV 89.0 90.1 90.3 90.4  PLT 159 192 193 224   Medications:    . aspirin EC  325 mg Oral Daily  . atorvastatin  20 mg Oral q1800  . cloNIDine  0.3 mg Oral TID  . clopidogrel  75 mg Oral Daily  . doxazosin  4 mg Oral QHS  . Elbasvir-Grazoprevir  1 each Oral Daily  . furosemide  40 mg Oral Daily  . glipiZIDE  10 mg Oral QAC breakfast  . heparin  5,000 Units Subcutaneous Q8H  . hydrALAZINE  100 mg Oral TID  . hydrocerin  1 application Topical Daily  . insulin aspart  0-15 Units Subcutaneous TID WC  . insulin aspart  3 Units Subcutaneous TID WC  . insulin glargine  10 Units Subcutaneous QHS  . losartan  100 mg Oral Daily  . polyethylene glycol  17 g Oral Daily  . saccharomyces boulardii  250 mg Oral BID  . senna  1 tablet Oral Daily   Elmarie Shiley, MD 11/09/2016, 11:06 AM

## 2016-11-09 NOTE — Progress Notes (Signed)
Social Work Patient ID: John Jacobson, male   DOB: 1956-02-02, 61 y.o.   MRN: [id number]  Met with pt to discuss team conference goals mod/i level and target discharge date 6/1. He feels he is doing well and wants to get home Soon but wants to be ready also. Discussed OP therapies he feels he and daughter can get him there. Have left a message for his daughter awaiting return call regarding questions or concerns. Work toward discharge 6/1.

## 2016-11-10 ENCOUNTER — Inpatient Hospital Stay (HOSPITAL_COMMUNITY): Payer: Medicaid Other | Admitting: *Deleted

## 2016-11-10 ENCOUNTER — Encounter (HOSPITAL_COMMUNITY): Payer: Self-pay

## 2016-11-10 ENCOUNTER — Inpatient Hospital Stay (HOSPITAL_COMMUNITY): Payer: Medicaid Other | Admitting: Speech Pathology

## 2016-11-10 ENCOUNTER — Inpatient Hospital Stay (HOSPITAL_COMMUNITY): Payer: Medicaid Other | Admitting: Physical Therapy

## 2016-11-10 ENCOUNTER — Inpatient Hospital Stay (HOSPITAL_COMMUNITY): Payer: Medicaid Other | Attending: Physical Medicine & Rehabilitation | Admitting: Occupational Therapy

## 2016-11-10 DIAGNOSIS — R7309 Other abnormal glucose: Secondary | ICD-10-CM

## 2016-11-10 DIAGNOSIS — B1920 Unspecified viral hepatitis C without hepatic coma: Secondary | ICD-10-CM

## 2016-11-10 LAB — GLUCOSE, CAPILLARY
GLUCOSE-CAPILLARY: 230 mg/dL — AB (ref 65–99)
GLUCOSE-CAPILLARY: 90 mg/dL (ref 65–99)
GLUCOSE-CAPILLARY: 197 mg/dL — AB (ref 65–99)
GLUCOSE-CAPILLARY: 258 mg/dL — AB (ref 65–99)

## 2016-11-10 LAB — BASIC METABOLIC PANEL
ANION GAP: 9 (ref 5–15)
BUN: 37 mg/dL — ABNORMAL HIGH (ref 6–20)
CO2: 25 mmol/L (ref 22–32)
Calcium: 9.4 mg/dL (ref 8.9–10.3)
Chloride: 105 mmol/L (ref 101–111)
Creatinine, Ser: 2.31 mg/dL — ABNORMAL HIGH (ref 0.61–1.24)
GFR calc non Af Amer: 29 mL/min — ABNORMAL LOW (ref >60)
GFR, EST AFRICAN AMERICAN: 33 mL/min — AB (ref >60)
Glucose, Bld: 98 mg/dL (ref 65–99)
POTASSIUM: 4.3 mmol/L (ref 3.5–5.1)
Sodium: 139 mmol/L (ref 135–145)

## 2016-11-10 NOTE — Discharge Summary (Signed)
Discharge summary job [phone]

## 2016-11-10 NOTE — Progress Notes (Signed)
Patient ID: John Jacobson, male   DOB: 1956/02/16, 61 y.o.   MRN: [id number]  [facility] Progress Note   Assessment/ Plan:   1. Uncontrolled hypertension: Blood pressure remains elevated with significant orthostatic drop that at this time limits aggressiveness of lowering therapy. Renal artery dopplers done yesterday and results pending. Blood pressures remain intermittently elevated--may benefit from trial of aldactone as an out-patient with frequent labs. 2. Acute kidney injury on chronic kidney disease stage III: Creatinine appears to have improved back to his baseline range-recently uptitrated ARB, awaiting labs today. 3. Ataxia with left cerebellar infarcts: Ongoing inpatient rehabilitation --possible DC tomorrow 4. Anemia: Possibly anemia of chronic kidney disease, iron stores borderline-given intravenous iron.   Subjective:   Reports to be feeling fair- denies any chest pain or shortness of breath    Objective:   BP (!) 160/82   Pulse 65   Temp 98.3 F (36.8 C) (Oral)   Resp 18   Ht 5\' 6"  (1.676 m)   Wt 75.1 kg (165 lb 9.1 oz)   SpO2 98%   BMI 26.72 kg/m   Physical Exam: [id number] sitting on the edge of his bed CVS: Pulse regular bradycardia, S1 and S2 normal Resp: Clear to auscultation, no rales/rhonchi Abd: Soft, flat, nontender Ext: No lower extremity edema  Labs: BMET  Recent Labs Lab 11/04/16 1416 11/07/16 1322 11/09/16 0500  NA 138 134* 137  K 4.2 4.6 4.1  CL 105 101 104  CO2 25 23 26   GLUCOSE 93 272* 154*  BUN 21* 34* 35*  CREATININE 1.91* 2.40* 2.03*  CALCIUM 8.7* 8.6* 8.9  PHOS  --   --  3.9   CBC  Recent Labs Lab 11/04/16 1416 11/07/16 1322 11/09/16 0500  WBC 11.5* 11.8* 9.4  NEUTROABS  --  7.8* 5.5  HGB 11.3* 10.4* 10.7*  HCT 34.4* 32.4* 32.8*  MCV 90.1 90.3 90.4  PLT 192 193 224   Medications:    . aspirin EC  325 mg Oral Daily  . atorvastatin  20 mg Oral q1800  . cloNIDine  0.3 mg Oral TID  . clopidogrel   75 mg Oral Daily  . doxazosin  4 mg Oral QHS  . Elbasvir-Grazoprevir  1 each Oral Daily  . furosemide  40 mg Oral Daily  . glipiZIDE  10 mg Oral QAC breakfast  . heparin  5,000 Units Subcutaneous Q8H  . hydrALAZINE  100 mg Oral TID  . hydrocerin  1 application Topical Daily  . insulin aspart  0-15 Units Subcutaneous TID WC  . insulin aspart  3 Units Subcutaneous TID WC  . insulin glargine  10 Units Subcutaneous QHS  . losartan  100 mg Oral Daily  . polyethylene glycol  17 g Oral Daily  . saccharomyces boulardii  250 mg Oral BID  . senna  1 tablet Oral Daily   Elmarie Shiley, MD 11/10/2016, 11:08 AM

## 2016-11-10 NOTE — Plan of Care (Signed)
Problem: RH SKIN INTEGRITY Goal: RH STG MAINTAIN SKIN INTEGRITY WITH ASSISTANCE STG Maintain Skin Integrity With min Assistance.   Outcome: Progressing No skin issues noted  Problem: RH SAFETY Goal: RH STG ADHERE TO SAFETY PRECAUTIONS W/ASSISTANCE/DEVICE STG Adhere to Safety Precautions With min Assistance and appropriate assistive Device.   Outcome: Progressing Safety precautions maintained, patient is aware  Problem: RH PAIN MANAGEMENT Goal: RH STG PAIN MANAGED AT OR BELOW PT'S PAIN GOAL <3 on a 0-10 pain scale  Outcome: Progressing Medicated twice for pain with full relief

## 2016-11-10 NOTE — Evaluation (Signed)
Recreational Therapy Assessment and Plan  Patient Details  Name: John Jacobson MRN: [id number] Date of Birth: [birth year] Today's Date: 11/10/2016  Rehab Potential:  Excellent ELOS: d/c 6/1  Assessment Clinical Impression: Problem List:      Patient Active Problem List   Diagnosis Date Noted  . Cerebellar infarction (HCC) 11/04/2016  . Type 2 diabetes mellitus with peripheral neuropathy (HCC)   . Hyperlipidemia   . Gait disturbance, post-stroke   . CKD (chronic kidney disease), stage III 11/02/2016  . Normocytic anemia 11/02/2016  . Ataxia 11/02/2016  . Ischemic stroke (HCC) 11/02/2016  . Puncture wound of right foot 11/02/2016  . Chronic diastolic CHF (congestive heart failure) (HCC) 11/02/2016  . Chronic left-sided low back pain 11/02/2016  . Demand ischemia (HCC)   . ARF (acute renal failure) (HCC)   . Hypertensive urgency 03/29/2016  . Abnormal electrocardiogram 05/24/2011  . Chest pain 05/24/2011  . Hypertension 05/24/2011  . Tobacco abuse 05/24/2011  . DM (diabetes mellitus), type 2 with renal complications (HCC) 05/24/2011    Past Medical History:      Past Medical History:  Diagnosis Date  . Asthma   . DM (diabetes mellitus) (HCC)   . HTN (hypertension)   . RBBB (right bundle branch block)    Past Surgical History:       Past Surgical History:  Procedure Laterality Date  . BACK SURGERY    . HAND SURGERY      Assessment & Plan Clinical Impression:John A Smithis a 61 y.o.right handed malewith history of asthma, diabetes mellitus, hypertension, right bundle branch block, tobacco abuse, CKD stage III, chronic diastolic congestive heart failure. 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 One level home. Brother recently had CABG. Presented 11/02/2016 with dizziness as well as gait disturbance and left-sided numbness over the past 4 days as well  as reported fall. Patient also reported that he had stepped on a nail with his right foot. CT/MRI showed patchy multifocal acute ischemic infarcts involving the superior left cerebellar hemisphere and left midbrain/pons. No associated hemorrhage or mass effect. Scattered remote lacunar infarcts involving the bilateral basal ganglia corona radiata as well as left thalamus. 3 mm pituitary lesion indeterminate. MRA negative for large vessel occlusion. Patient did not receive TPA. Echocardiogram with ejection fraction 65% and grade 1 diastolic dysfunction.Carotid Dopplers with no ICA stenosis.Neurology consulted presently on Plavix for CVA prophylaxis. Tolerating a regular diet. Subcutaneous heparin added for DVT prophylaxis. Physical and occupationaltherapy evaluation completed 11/03/2016 with recommendations of physical medicine rehabilitation consult.Patient was admitted for a comprehensive rehabilitation program.    Pt presents with decreased activity tolerance, decreased functional mobility, decreased balance, decreased coordination Limiting pt's independence with leisure/community pursuits.  Plan Min 1 time for community rientegration Recommendations for other services: None   Discharge Criteria: Patient will be discharged from TR if patient refuses treatment 3 consecutive times without medical reason.  If treatment goals not met, if there is a change in medical status, if patient makes no progress towards goals or if patient is discharged from hospital.  The above assessment, treatment plan, treatment alternatives and goals were discussed and mutually agreed upon: by patient  Session note:  Pain:  No c/o  Pt participated in community reintegration/outing to local park at overall supervision ambulatory level using RW.  Goals focused on safe mobility on various surface types, identification & negotiation of obstacles, accessing public restroom and energy conservation.  All goals  met.  Pt's  daughter present, participating, cuing pt and asking questions about safe mobility.  Both state that they are ready for discharge home tomorrow.  See outing goal sheet in shadow chart for full details.  No further TR due discharge date.    [place] 11/10/2016, 2:58 PM

## 2016-11-10 NOTE — Progress Notes (Signed)
Occupational Therapy Discharge Summary  Patient Details  Name: John Jacobson MRN: [id number] Date of Birth: 03/14/1956   Patient has met 3 of 11 long term goals due to improved activity tolerance, improved balance, postural control, ability to compensate for deficits, functional use of  LEFT upper and LEFT lower extremity, improved attention, improved awareness and improved coordination.  Patient to discharge at overall Modified Independent level.  Patient's care partner is independent to provide the necessary physical assistance for higher level iADLs at discharge.    Reasons goals not met: n/a  Recommendation:  Patient will benefit from ongoing skilled OT services in home health setting to continue to advance functional skills in the area of BADL.  Equipment: Tub transfer bench, 3-in-1 BSC, RW  Reasons for discharge: treatment goals met and discharge from hospital  Patient/family agrees with progress made and goals achieved: Yes  OT Discharge Precautions/Restrictions  Precautions Precautions: None Restrictions Weight Bearing Restrictions: No Pain  none/denies pain ADL ADL Eating: Independent Grooming: Modified independent Upper Body Bathing: Modified independent Lower Body Bathing: Modified independent Upper Body Dressing: Modified independent (Device) Lower Body Dressing: Modified independent Toileting: Modified independent Toilet Transfer: Modified independent Toilet Transfer Method: Ambulating Tub/Shower Transfer: Modified independent Tub/Shower Transfer Method: Optometrist: Transfer tub bench ADL Comments: Please see functional navigator Perception  Perception: Within Functional Limits Praxis Praxis: Intact Cognition Overall Cognitive Status: Within Functional Limits for tasks assessed Arousal/Alertness: Awake/alert Orientation Level: Oriented X4 Attention: Divided Selective Attention: Appears intact Divided Attention: Impaired Divided  Attention Impairment: Functional complex Memory: Appears intact Awareness: Appears intact Problem Solving: Appears intact Safety/Judgment: Appears intact Sensation Sensation Light Touch: Impaired by gross assessment (reports tingling in LUE and LLE hip>knee) Coordination Gross Motor Movements are Fluid and Coordinated: Yes Fine Motor Movements are Fluid and Coordinated: Yes Motor  Motor Motor: Hemiplegia Motor - Discharge Observations: L UE and L LE strength/coordination improved  Mobility  Bed Mobility Bed Mobility: Supine to Sit;Sit to Supine Supine to Sit: 6: Modified independent (Device/Increase time) Sit to Supine: 6: Modified independent (Device/Increase time) Transfers Sit to Stand: 6: Modified independent (Device/Increase time) Stand to Sit: 6: Modified independent (Device/Increase time)  Balance Balance Balance Assessed: Yes Dynamic Standing Balance Dynamic Standing - Balance Support: No upper extremity supported;During functional activity Dynamic Standing - Level of Assistance: 6: Modified independent (Device/Increase time) Extremity/Trunk Assessment RUE Assessment RUE Assessment: Within Functional Limits LUE Assessment LUE Assessment: Within Functional Limits   See Function Navigator for Current Functional Status.  John Jacobson John Jacobson 11/10/2016, 4:34 PM

## 2016-11-10 NOTE — Discharge Summary (Signed)
NAME:  John Jacobson, METHOT NO.:  [id number]  MEDICAL RECORD NO.:  [id number]  LOCATION:                                 FACILITY:  PHYSICIAN:  Charlett Blake, M.D.   DATE OF BIRTH:  DATE OF ADMISSION:  11/04/2016 DATE OF DISCHARGE:  11/11/2016                              DISCHARGE SUMMARY   DISCHARGE DIAGNOSES: 1. Left cerebellar, midbrain and pontine infarcts. 2. Subcutaneous heparin for deep vein thrombosis prophylaxis. 3. Pain management. 4. Diabetes mellitus. 5. Peripheral neuropathy. 6. Hypertension. 7. Chronic diastolic congestive heart failure. 8. Chronic kidney disease stage 3. 9. Hypokalemia. 10.Tobacco abuse. 11.Constipation. 12.Acute blood loss anemia.  HISTORY OF PRESENT ILLNESS:  This is a 61 year old right-handed male with history of asthma, diabetes mellitus, hypertension, tobacco abuse, CKD stage 3, as well as chronic diastolic congestive heart failure. Lives with his brother, independent prior to admission, working from home.  Brother recently had bypass surgery.  Presented on Nov 02, 2016, with dizziness as well as gait disturbance and left-sided numbness as well as reported fall.  The patient also reported he stepped on a nail with his right foot.  CT MRI showed patchy multifocal acute ischemic infarcts involving the superior left cerebellar hemisphere and left mid brain.  No associated hemorrhage.  Scattered remote lacunar infarcts involving bilateral basal ganglia.  MRA negative for large vessel occlusion.  The patient did not receive tPA.  Echocardiogram with ejection fraction of 93%, grade 1 diastolic dysfunction.  Carotid Dopplers negative.  Neurology consulted.  Maintained on Plavix for CVA prophylaxis as well as aspirin.  Tolerating a regular diet. Subcutaneous heparin for DVT prophylaxis.  The patient was admitted for comprehensive rehab program.  PAST MEDICAL HISTORY:  See discharge diagnoses.  SOCIAL HISTORY:  Lives  with brother, independent prior to admission.  FUNCTIONAL STATUS:  Upon admission to rehab services, was moderate assist, 90 feet, one-person handheld assist, minimal guard sit to stand; min-to-mod assist activities of daily living.  PHYSICAL EXAMINATION:  VITAL SIGNS:  Blood pressure 150/78, pulse 51, temperature 98, respirations 20. GENERAL:  This was an alert male, in no acute distress, oriented x3. HEENT:  EOMs intact. NECK:  Supple.  Nontender.  No JVD. CARDIAC:  Regular rate and rhythm.  No murmur. LUNGS:  Clear to auscultation.  No respiratory distress. ABDOMEN:  Soft, nontender.  Good bowel sounds.  REHABILITATION HOSPITAL COURSE:  The patient was admitted to inpatient rehab services with therapies initiated on a 3-hour daily basis, consisting of physical therapy, occupational therapy, and rehabilitation nursing.  The following issues were addressed during the patient's rehabilitation stay.  Pertaining to Mr. Pieczynski left cerebellar mid brain pontine infarct remained stable, maintained on aspirin and Plavix therapy, would follow up with Neurology Services.  Subcutaneous heparin for DVT prophylaxis.  No bleeding episodes.  Pain management with the use of oxycodone for chronic back pain.  Diabetes mellitus, peripheral neuropathy, hemoglobin A1c 8.6.  He continued on Glucotrol as well as Lantus insulin.  Full diabetic teaching.  Blood pressures monitored.  He had some intermittent elevations.  A renal artery duplex showed no renal artery stenosis.  During his monitoring workup of hypertension noted  CKD stage 3 with Nephrology Services consulted for recommendations and blood pressure control as well as renal function.  Latest creatinine 2.03, which was close to the patient's baseline and continue to monitor.  The patient exhibited no other signs of fluid overload.  He would remain on Lasix therapy as advised.  Had a history of tobacco abuse, receiving full counseling in regard to  cessation of nicotine products.  Noted history of hepatitis C.  He was completing 2-week course of Zepatier. The patient received weekly collaborative interdisciplinary team conferences to discuss estimated length of stay, family teaching, any barriers to his discharge.  He was ambulating a rolling walker 150 feet x2, distance supervision, minimal cues for his safety, floor transfers with supervision assist.  He could gather his belongings for activities of daily living and homemaking, ambulate to the therapy apartment with supervision using his rolling walker, ambulating to the bathroom, demonstrated tub transfers, using a tub transfer bench and then demonstrated transfer with shower chair.  He was discharged to home.  DISCHARGE MEDICATIONS:  Included: 1. Aspirin 325 mg p.o. daily. 2. Lipitor 20 mg p.o. daily. 3. Catapres 0.3 mg t.i.d. 4. Plavix 75 mg p.o. daily. 5. Cardura 4 mg at bedtime. 6. Elbasvir-grazoprevir 50-100 mg daily x2 week course and stop. 7. Lasix 40 mg p.o. daily. 8. Glucotrol 10 mg p.o. daily. 9. Hydralazine 100 mg p.o. t.i.d. 10.Lantus insulin 10 units at bedtime. 11.Cozaar 100 mg p.o. daily. 12.MiraLAX daily, hold for loose stool. 13.Florastor 250 mg p.o. b.i.d. 14.Oxycodone 5-10 mg every 4 hours as needed pain.  DIET:  Diabetic diet.  FOLLOWUP:  The patient would follow up with Dr. Alysia Penna at the outpatient rehab center as directed; Dr. Erlinda Hong, Neurology Services, call for appointment; Dr. Elmarie Shiley, Renal Services, call for appointment; and Dr. Charlyne Petrin, infectious Disease, [address], [place], [place], call for appointment.  SPECIAL INSTRUCTIONS:  No driving.  No smoking.     Lauraine Rinne, P.A.   ______________________________ Charlett Blake, M.D.    DA/MEDQ  D:  11/10/2016  T:  11/10/2016  Job:  [id number]  cc:   Charlyne Petrin, MD Elmarie Shiley, MD Dr. Ethel Rana, M.D.

## 2016-11-10 NOTE — Progress Notes (Signed)
[facility]IC[facility] & REHABILITATION     PROGRESS NOTE  Subjective/Complaints:  Pt seen sitting up at the edge of the bed this AM, again on the phone throughout exam.  He slept well overnight.  He is looking forward to going home soon.   ROS: Denies CP, SOB, N/V/D.  Objective: Vital Signs: Blood pressure (!) 160/82, pulse 65, temperature 98.3 F (36.8 C), temperature source Oral, resp. rate 18, height 5\' 6"  (1.676 m), weight 75.1 kg (165 lb 9.1 oz), SpO2 98 %. No results found.  Recent Labs  11/07/16 1322 11/09/16 0500  WBC 11.8* 9.4  HGB 10.4* 10.7*  HCT 32.4* 32.8*  PLT 193 224    Recent Labs  11/07/16 1322 11/09/16 0500  NA 134* 137  K 4.6 4.1  CL 101 104  GLUCOSE 272* 154*  BUN 34* 35*  CREATININE 2.40* 2.03*  CALCIUM 8.6* 8.9   CBG (last 3)   Recent Labs  11/09/16 1643 11/09/16 2025 11/10/16 0651  GLUCAP 136* 182* 230*    Wt Readings from Last 3 Encounters:  11/10/16 75.1 kg (165 lb 9.1 oz)  11/04/16 80.1 kg (176 lb 9.6 oz)  04/02/16 74.3 kg (163 lb 11.2 oz)    Physical Exam:  BP (!) 160/82   Pulse 65   Temp 98.3 F (36.8 C) (Oral)   Resp 18   Ht 5\' 6"  (1.676 m)   Wt 75.1 kg (165 lb 9.1 oz)   SpO2 98%   BMI 26.72 kg/m  Constitutional: He appears well-developed. NAD. HENT: Normocephalicand atraumatic.  Eyes: EOMI. No discharge.  Cardiovascular: RRR. No JVD. Respiratory: Breath sounds normal. Unlabored.  GI: Soft. Bowel sounds are normal. Musc: Left hand intrinsic atrophy.  Neurological: He is alertand oriented.  Follows simple commands.  Reasonable insight and awarenes Ataxia left arm and leg. Motor 5/5 proximal to distal RUE and RLE.  4+/5 proximal to distal LUE and LLE (unchanged) Psychiatric: He has a normal mood and affect. His behavior is normal.  Skin. Warm and dry  Assessment/Plan: 1. Functional deficits secondary to left cerebellar, midbrain and pontine infarct which require 3+ hours per day of interdisciplinary  therapy in a comprehensive inpatient rehab setting. Physiatrist is providing close team supervision and 24 hour management of active medical problems listed below. Physiatrist and rehab team continue to assess barriers to discharge/monitor patient progress toward functional and medical goals.  Function:  Bathing Bathing position      Bathing parts Body parts bathed by patient: Right arm, Left arm, Chest, Abdomen, Front perineal area, Buttocks, Right upper leg, Left upper leg, Right lower leg, Left lower leg Body parts bathed by helper: Back  Bathing assist Assist Level: Supervision or verbal cues      Upper Body Dressing/Undressing Upper body dressing   What is the patient wearing?: Pull over shirt/dress     Pull over shirt/dress - Perfomed by patient: Thread/unthread right sleeve, Thread/unthread left sleeve, Put head through opening, Pull shirt over trunk          Upper body assist Assist Level: Supervision or verbal cues      Lower Body Dressing/Undressing Lower body dressing   What is the patient wearing?: Underwear, Pants, Socks Underwear - Performed by patient: Thread/unthread right underwear leg, Thread/unthread left underwear leg, Pull underwear up/down   Pants- Performed by patient: Thread/unthread right pants leg, Thread/unthread left pants leg, Pull pants up/down       Socks - Performed by patient: Don/doff right sock, Don/doff left  sock                Lower body assist Assist for lower body dressing: Supervision or verbal cues      Toileting Toileting   Toileting steps completed by patient: Adjust clothing prior to toileting, Performs perineal hygiene, Adjust clothing after toileting      Toileting assist Assist level: Supervision or verbal cues   Transfers Chair/bed transfer   Chair/bed transfer method: Ambulatory Chair/bed transfer assist level: Supervision or verbal cues Chair/bed transfer assistive device: Medical sales representative      Max distance: 138ft  Assist level: Supervision or verbal cues   Wheelchair     Max wheelchair distance:  (100 ft) Assist Level: Touching or steadying assistance (Pt > 75%)  Cognition Comprehension Comprehension assist level: Follows complex conversation/direction with extra time/assistive device  Expression Expression assist level: Expresses complex ideas: With extra time/assistive device  Social Interaction Social Interaction assist level: Interacts appropriately with others with medication or extra time (anti-anxiety, antidepressant).  Problem Solving Problem solving assist level: Solves complex problems: With extra time  Memory Memory assist level: Recognizes or recalls 90% of the time/requires cueing < 10% of the time    Medical Problem List and Plan: 1. Left hemiataxia and gait disturbancesecondary to left cerebellar, midbrain and pontine infarcts  Cont CIR 2. DVT Prophylaxis/Anticoagulation: Subcutaneous heparin. Monitor platelet counts and any signs of bleeding 3. Pain Management/chronic back pain: Oxycodone as needed 4. Mood: Provide emotional support 5. Neuropsych: This patient iscapable of making decisions on hisown behalf. 6. Skin/Wound Care: Routine skin checks 7. Fluids/Electrolytes/Nutrition: Routine I&Os 8.Diabetes mellitus and peripheral neuropathy and retinopathy.   Hemoglobin A1c 8.6. Lantus insulin 10 units daily at bedtime. Glucotrol 10 mg daily Check CBGs before meals and at bedtime. Diabetic teaching   Eye injections as outpt  Continues to be labile, will need ambulatory adjustments.    Will cont to monitor 9.Hypertension with history of right bundle branch block. Clonidine 0.3 mg 3 times a day, Lasix 40 mg daily, hydralazine 100 mg 3 times a day  Cardura 4mg  qhs  Cozaar 50 mg daily, increased to 75 on 5/28, increased to 100 on 5/29.   Monitor with increased mobility  Renal artery U/S neg for stenosis  Nephro consulted, appreciate recs  With  orthostasis 10.Chronic diastolic congestive heart failure. Continue Lasix. Monitor for any signs of fluid overload Filed Weights   11/08/16 0500 11/09/16 0430 11/10/16 0605  Weight: 81.8 kg (180 lb 4.8 oz) 82.4 kg (181 lb 10.5 oz) 75.1 kg (165 lb 9.1 oz)  11.CKD stage III. With AKI  Cr 2.03 on 5/30  Encourage fluids 12.Hypokalemia.   K+ 4.1 on 5/30 13.Tobacco abuse. Counseling 14.Constipation. Laxative assistance 15. Hep C  Cont Zepatier x1 weeks. 16. Leukocytosis: Resolved  WBCs 9.4 on 5/30  Afebrile  Cont to monitor 17. ABLA  Hb 10.7 on 5/30  LOS (Days) 6 A FACE TO FACE EVALUATION WAS PERFORMED  John Jacobson [place] 11/10/2016 7:45 AM

## 2016-11-10 NOTE — Progress Notes (Signed)
Occupational Therapy Session Note  Patient Details  Name: John Jacobson MRN: [id number] Date of Birth: [birth year]  Today's Date: 11/10/2016 OT Concurrent Time: 1300-1430 OT Concurrent Time Calculation (min): 90 min   Short Term Goals: Week 1:  OT Short Term Goal 1 (Week 1): STG = LTGs d/t ELOS  Skilled Therapeutic Interventions/Progress Updates:    Pt participated in community reintegration/outing to community park. Pt ambulated at overall mod I level using RW. Treatment goals focused on safe community mobility, family education, ambulating on different surfaces and grades of turf with and without RW, energy conservation techniques, identifying safety hazards and barriers to mobility, and accessing public restroom. Pt's daughter educated on safe techniques for community reintegration with father as well. See outing goal sheet in shadow chart for full details.   Therapy Documentation Pain: Pain Assessment None/denies pain  See Function Navigator for Current Functional Status.   Therapy/Group: Concurrent Therapy  John Jacobson John Jacobson 11/10/2016, 4:22 PM

## 2016-11-10 NOTE — Progress Notes (Signed)
Speech Language Pathology Daily Session Note  Patient Details  Name: John Jacobson MRN: [id number] Date of Birth: Jul 31, 1955  Today's Date: 11/10/2016 SLP Individual Time: 1130-1215 SLP Individual Time Calculation (min): 45 min  Short Term Goals: Week 1: SLP Short Term Goal 1 (Week 1): STGs=LTGs  Skilled Therapeutic Interventions: Pt was seen for cognitive treatment in a non distracting environment in the pm. Pt completed visual scanning activity on computer and on paper with 100% acc with min cues. Pt was able to dictate money, numbers, written numbers and short sentences with 80% acc with min cues for repetition. Pt completed paragraph retention ex. With min cues with 90% acc. Pt was left in room, sitting on the bed with bed alarm on and call bell within reach.      Function:  Eating Eating                 Cognition Comprehension Comprehension assist level: Follows complex conversation/direction with extra time/assistive device  Expression   Expression assist level: Expresses complex ideas: With extra time/assistive device  Social Interaction Social Interaction assist level: Interacts appropriately with others with medication or extra time (anti-anxiety, antidepressant).  Problem Solving Problem solving assist level: Solves complex problems: With extra time  Memory Memory assist level: Recognizes or recalls 90% of the time/requires cueing < 10% of the time    Pain Pain Assessment Pain Assessment: No/denies pain Pain Score: 3  Pain Location: Foot  Therapy/Group: Individual Therapy  Darlina Sicilian 11/10/2016, 12:13 PM

## 2016-11-10 NOTE — Progress Notes (Signed)
Physical Therapy Discharge Summary  Patient Details  Name: John Jacobson MRN: [id number] Date of Birth: November 28, 1955  Today's Date: 11/10/2016 PT Individual Time: 1000-1100 PT Individual Time Calculation (min): 60 min    Patient has met 9 of 9 long term goals due to improved activity tolerance, improved balance, improved postural control, functional use of  left upper extremity and left lower extremity, improved attention and improved coordination.  Patient to discharge at an ambulatory level Modified Independent.  Reasons goals not met: n/a  Recommendation:  Patient will benefit from ongoing skilled PT services in outpatient setting to continue to advance safe functional mobility, address ongoing impairments in balance and coordination, and minimize fall risk.  Equipment: RW  Reasons for discharge: treatment goals met and discharge from hospital  Patient/family agrees with progress made and goals achieved: Yes   Skilled PT Intervention: No c/o pain.  Session focus on reassessment of functional mobility, balance, strength, and coordination, functional mobility, strengthening, and pt education.    Pt ambulates throughout unit, on a variety of surfaces and up/down incline, with RW mod I.  Transfers from a variety of surfaces mod I.  PT instructed pt in stair negotiation with 1 rail mod I.  Pt completes 10 minutes on nustep at level 4 for forced use, reciprocal stepping pattern retraining, and activity tolerance.  PT educated pt on core strengthening exercises (bridges and modified sit ups), and isometric shoulder flex/ext/abd for shoulder pain.  Pt returned to room at end of session and positioned EOB with call bell in reach and needs met.   PT Discharge Precautions/Restrictions Precautions Precautions: None Restrictions Weight Bearing Restrictions: No  Pain Pain Assessment Pain Assessment: No/denies pain Vision/Perception  Vision - Assessment Additional Comments: pt can read  about 10' away, but reports blurring Perception Perception: Within Functional Limits Praxis Praxis: Intact  Cognition Overall Cognitive Status: Impaired/Different from baseline Arousal/Alertness: Awake/alert Orientation Level: Oriented X4 Attention: Divided Selective Attention: Appears intact Divided Attention: Impaired Memory: Appears intact Awareness: Appears intact Problem Solving: Appears intact Safety/Judgment: Appears intact Sensation Sensation Light Touch: Impaired by gross assessment (reports tingling in LUE and LLE hip>knee) Coordination Gross Motor Movements are Fluid and Coordinated: Yes Fine Motor Movements are Fluid and Coordinated: Yes Motor  Motor Motor: Hemiplegia  Mobility Bed Mobility Bed Mobility: Supine to Sit;Sit to Supine Supine to Sit: 6: Modified independent (Device/Increase time) Sit to Supine: 6: Modified independent (Device/Increase time) Transfers Transfers: Yes Sit to Stand: 6: Modified independent (Device/Increase time) Stand to Sit: 6: Modified independent (Device/Increase time) Stand Pivot Transfers: 6: Modified independent (Device/Increase time) Locomotion  Ambulation Ambulation: Yes Ambulation/Gait Assistance: 6: Modified independent (Device/Increase time) Ambulation Distance (Feet): 150 Feet Assistive device: Rolling walker Stairs / Additional Locomotion Stairs: Yes Stairs Assistance: 6: Modified independent (Device/Increase time) Stair Management Technique: One rail Left Number of Stairs: 12 Ramp: 6: Modified independent (Device) Wheelchair Mobility Wheelchair Mobility: No  Trunk/Postural Assessment  Cervical Assessment Cervical Assessment: Within Functional Limits Thoracic Assessment Thoracic Assessment: Exceptions to [facility] (forward rounded shoulders) Lumbar Assessment Lumbar Assessment: Exceptions to [facility] (posterior pelvic tilt ) Postural Control Postural Control: Within Functional Limits  Balance Dynamic Standing  Balance Dynamic Standing - Balance Support: No upper extremity supported Dynamic Standing - Level of Assistance: 6: Modified independent (Device/Increase time) Extremity Assessment      RLE Strength Right Hip Flexion: 4+/5 Right Knee Flexion: 5/5 Right Knee Extension: 5/5 Right Ankle Dorsiflexion: 4/5 Right Ankle Plantar Flexion: 4/5 LLE Strength Left Hip Flexion: 3+/5 Left Knee Flexion:  4+/5 Left Knee Extension: 4+/5 Left Ankle Dorsiflexion: 4/5 Left Ankle Plantar Flexion: 4/5   See Function Navigator for Current Functional Status.  Caitlinn Klinker E Penven-Crew 11/10/2016, 10:30 AM

## 2016-11-10 NOTE — Progress Notes (Signed)
Occupational Therapy Session Note  Patient Details  Name: John Jacobson MRN: [id number] Date of Birth: May 31, 1956  Today's Date: 11/10/2016 OT Individual Time: 0902-1000 OT Individual Time Calculation (min): 58 min   Short Term Goals: Week 1:  OT Short Term Goal 1 (Week 1): STG = LTGs d/t ELOS  Skilled Therapeutic Interventions/Progress Updates:    OT treatment session focused on standing balance, L FMC, grip/pinch strength, home iADL management, and multi-tasking. Pt ambulated to therapy apartment Mod I w/ RW. Challenged balance with functional bed making task incorporating bending over to pick up objects. Pt required 75% supervision with intermittent steady A to correct slight LOB. Pt with improved reactions as he is able to correct most LOB without physical assist. Pt then ambulated into kitchen and OT discussed safety techniques for accessing fridge, cabinets, and pantry. Pt able to reach upper cabinet to retrieve water cup and pour cup of water from pitcher in fridge. Pt transported water to table with supervision. Pt then challenged with multi task of walking without device, balancing ball on flat surface w/ B UE's and talking to therapist. Pt able to maintain balance with ball 75% of the time, but had increased episodes of LOB and decreased step length. Pt given medium soft red thera-putty with hand-out for L hand there-ex. Demonstrated exercises for in-hand manipulation, thumb abduction, rotation, pinch, and grip strength. Pt demonstrated understanding and left seated EOB with needs met.   Therapy Documentation Precautions:  Precautions Precautions: Fall Precaution Comments:  (Monitor BP) Restrictions Weight Bearing Restrictions: No Pain:  none/denies pain    See Function Navigator for Current Functional Status.   Therapy/Group: Individual Therapy  Valma Cava 11/10/2016, 9:59 AM

## 2016-11-11 ENCOUNTER — Inpatient Hospital Stay (HOSPITAL_COMMUNITY): Payer: Medicaid Other | Admitting: Speech Pathology

## 2016-11-11 ENCOUNTER — Inpatient Hospital Stay (HOSPITAL_COMMUNITY): Payer: Medicaid Other | Admitting: Physical Therapy

## 2016-11-11 LAB — RENAL FUNCTION PANEL
ANION GAP: 9 (ref 5–15)
Albumin: 3.5 g/dL (ref 3.5–5.0)
BUN: 33 mg/dL — ABNORMAL HIGH (ref 6–20)
CHLORIDE: 101 mmol/L (ref 101–111)
CO2: 24 mmol/L (ref 22–32)
Calcium: 9.4 mg/dL (ref 8.9–10.3)
Creatinine, Ser: 2.23 mg/dL — ABNORMAL HIGH (ref 0.61–1.24)
GFR calc Af Amer: 35 mL/min — ABNORMAL LOW (ref >60)
GFR, EST NON AFRICAN AMERICAN: 30 mL/min — AB (ref >60)
Glucose, Bld: 204 mg/dL — ABNORMAL HIGH (ref 65–99)
POTASSIUM: 4.1 mmol/L (ref 3.5–5.1)
Phosphorus: 3.8 mg/dL (ref 2.5–4.6)
Sodium: 134 mmol/L — ABNORMAL LOW (ref 135–145)

## 2016-11-11 LAB — GLUCOSE, CAPILLARY: Glucose-Capillary: 175 mg/dL — ABNORMAL HIGH (ref 65–99)

## 2016-11-11 MED ORDER — SACCHAROMYCES BOULARDII 250 MG PO CAPS: 250.0000 mg | ORAL_CAPSULE | Freq: Two times a day (BID) | ORAL | 0 refills | Status: DC

## 2016-11-11 MED ORDER — ATORVASTATIN CALCIUM 20 MG PO TABS: 20.0000 mg | ORAL_TABLET | Freq: Every day | ORAL | 1 refills | Status: DC

## 2016-11-11 MED ORDER — FUROSEMIDE 40 MG PO TABS: 40.0000 mg | ORAL_TABLET | Freq: Every day | ORAL | 0 refills | Status: DC

## 2016-11-11 MED ORDER — VITAMIN D 1000 UNITS PO TABS: 2000.0000 [IU] | ORAL_TABLET | Freq: Every day | ORAL | 1 refills | Status: DC

## 2016-11-11 MED ORDER — CLOPIDOGREL BISULFATE 75 MG PO TABS: 75.0000 mg | ORAL_TABLET | Freq: Every day | ORAL | 1 refills | Status: DC

## 2016-11-11 MED ORDER — CLONIDINE HCL 0.3 MG PO TABS: 0.3000 mg | ORAL_TABLET | Freq: Three times a day (TID) | ORAL | 1 refills | Status: DC

## 2016-11-11 MED ORDER — DOXAZOSIN MESYLATE 4 MG PO TABS: 2.0000 mg | ORAL_TABLET | Freq: Two times a day (BID) | ORAL | 1 refills | Status: DC

## 2016-11-11 MED ORDER — LOSARTAN POTASSIUM 100 MG PO TABS: 100.0000 mg | ORAL_TABLET | Freq: Every day | ORAL | 0 refills | Status: DC

## 2016-11-11 MED ORDER — INSULIN GLARGINE 100 UNITS/ML SOLOSTAR PEN: 10.0000 [IU] | PEN_INJECTOR | Freq: Every day | SUBCUTANEOUS | 11 refills | Status: DC

## 2016-11-11 MED ORDER — HYDRALAZINE HCL 100 MG PO TABS: 100.0000 mg | ORAL_TABLET | Freq: Three times a day (TID) | ORAL | 1 refills | Status: DC

## 2016-11-11 MED ORDER — GLIPIZIDE 10 MG PO TABS: 10.0000 mg | ORAL_TABLET | Freq: Every day | ORAL | 0 refills | Status: DC

## 2016-11-11 MED ORDER — OXYCODONE HCL 5 MG PO TABS: 5.0000 mg | ORAL_TABLET | ORAL | 0 refills | Status: DC | PRN

## 2016-11-11 NOTE — Progress Notes (Signed)
Pt and family educated on discharge instructions. Home medications were picked up from pharmacy and returned to patient. Receipt in shadow chart. Pt has all belongings and equipment. Discharged to home.

## 2016-11-11 NOTE — Progress Notes (Signed)
Social Work  Discharge Note  The overall goal for the admission was met for:   Discharge location: [place] ON HIM  Length of Stay: Yes-7 DAYS  Discharge activity level: Yes-MOD/I LEVEL  Home/community participation: Yes  Services provided included: MD, RD, PT, OT, SLP, RN, CM, TR, Pharmacy and SW  Financial Services: Medicaid  Follow-up services arranged: Outpatient: [facility] & OT JUNE 5 TUESDAY @ 7:45-9:30 AM, DME: [facility], 3 IN 1, [facility] and Patient/Family has no preference for HH/DME agencies  Comments (or additional information):PT DID WELL AND REACHED MOD/I LEVEL ABLE TO GO HOME WITH HIS BROTHER WHO IS STILL RECOVERING FROM HIS CABG SURGERY.  Patient/Family verbalized understanding of follow-up arrangements: Yes  Individual responsible for coordination of the follow-up plan: [facility] AND PT  Confirmed correct DME delivered: Elease Hashimoto 11/11/2016    Elease Hashimoto

## 2016-11-11 NOTE — Discharge Instructions (Signed)
Inpatient Rehab Discharge Instructions  [place] Discharge date and time: No discharge date for patient encounter.   Activities/Precautions/ Functional Status: Activity: activity as tolerated Diet: diabetic diet Wound Care: none needed Functional status:  ___ No restrictions     ___ Walk up steps independently ___ 24/7 supervision/assistance   ___ Walk up steps with assistance ___ Intermittent supervision/assistance  ___ Bathe/dress independently ___ Walk with walker     _x__ Bathe/dress with assistance ___ Walk Independently    ___ Shower independently ___ Walk with assistance    ___ Shower with assistance ___ No alcohol     ___ Return to work/school ________  Special Instructions: No smoking   COMMUNITY REFERRALS UPON DISCHARGE:    Outpatient: PT & OT  Agency:[facility] Phone:[phone]   Date of Last Service:11/11/2016  Appointment Date/Time:JUNE 5 Tuesday 7:45-9:30 AM BOTH OT & PT SESSIONS  Medical Equipment/Items Ordered:ROLLING WALKER, 3 IN 1, TUB BENCH  Agency/Supplier:[facility] [phone]   GENERAL COMMUNITY RESOURCES FOR PATIENT/FAMILY: Support Groups:[facility] SUPPORT GROUP EVERY SECOND Thursday ( SEPT-MAY) @ 3:00-4:00 PM ON THE REHAB UNIT QUESTIONS CONTACT CAITLYN [phone]  STROKE/TIA DISCHARGE INSTRUCTIONS SMOKING Cigarette smoking nearly doubles your risk of having a stroke & is the single most alterable risk factor  If you smoke or have smoked in the last 12 months, you are advised to quit smoking for your health.  Most of the excess cardiovascular risk related to smoking disappears within a year of stopping.  Ask you doctor about anti-smoking medications  [place] Quit Line: 1-800-QUIT NOW  Free Smoking Cessation Classes [phone]  CHOLESTEROL Know your levels; limit fat & cholesterol in your diet  Lipid Panel     Component Value Date/Time   CHOL 149 11/03/2016 0604   TRIG 91 11/03/2016 0604   HDL 37 (L) 11/03/2016 0604    CHOLHDL 4.0 11/03/2016 0604   VLDL 18 11/03/2016 0604   LDLCALC 94 11/03/2016 0604      Many patients benefit from treatment even if their cholesterol is at goal.  Goal: Total Cholesterol (CHOL) less than 160  Goal:  Triglycerides (TRIG) less than 150  Goal:  HDL greater than 40  Goal:  LDL (LDLCALC) less than 100   BLOOD PRESSURE American Stroke Association blood pressure target is less that 120/80 mm/Hg  Your discharge blood pressure is:  BP: (!) 153/76  Monitor your blood pressure  Limit your salt and alcohol intake  Many individuals will require more than one medication for high blood pressure  DIABETES (A1c is a blood sugar average for last 3 months) Goal HGBA1c is under 7% (HBGA1c is blood sugar average for last 3 months)  Diabetes:   Lab Results  Component Value Date   HGBA1C 8.6 (H) 11/03/2016     Your HGBA1c can be lowered with medications, healthy diet, and exercise.  Check your blood sugar as directed by your physician  Call your physician if you experience unexplained or low blood sugars.  PHYSICAL ACTIVITY/REHABILITATION Goal is 30 minutes at least 4 days per week  Activity: Increase activity slowly, Therapies: Physical Therapy: Home Health Return to work:   Activity decreases your risk of heart attack and stroke and makes your heart stronger.  It helps control your weight and blood pressure; helps you relax and can improve your mood.  Participate in a regular exercise program.  Talk with your doctor about the best form of exercise for you (dancing, walking, swimming, cycling).  DIET/WEIGHT Goal is to  maintain a healthy weight  Your discharge diet is: Diet heart healthy/carb modified Room service appropriate? Yes; Fluid consistency: Thin  liquids Your height is:  Height: 5\' 6"  (167.6 cm) Your current weight is: Weight: 82.4 kg (181 lb 10.5 oz) Your Body Mass Index (BMI) is:  BMI (Calculated): 29  Following the type of diet specifically designed for  you will help prevent another stroke.  Your goal weight range is:    Your goal Body Mass Index (BMI) is 19-24.  Healthy food habits can help reduce 3 risk factors for stroke:  High cholesterol, hypertension, and excess weight.  RESOURCES Stroke/Support Group:  Call [phone]   STROKE EDUCATION PROVIDED/REVIEWED AND GIVEN TO PATIENT Stroke warning signs and symptoms How to activate emergency medical system (call 911). Medications prescribed at discharge. Need for follow-up after discharge. Personal risk factors for stroke. Pneumonia vaccine given:  Flu vaccine given:  My questions have been answered, the writing is legible, and I understand these instructions.  I will adhere to these goals & educational materials that have been provided to me after my discharge from the hospital.      My questions have been answered and I understand these instructions. I will adhere to these goals and the provided educational materials after my discharge from the hospital.  Patient/Caregiver Signature _______________________________ Date __________  Clinician Signature _______________________________________ Date __________  Please bring this form and your medication list with you to all your follow-up doctor's appointments.

## 2016-11-11 NOTE — Progress Notes (Signed)
[place] PHYSICAL MEDICINE & REHABILITATION     PROGRESS NOTE  Subjective/Complaints:   No issues overnite  ROS: Denies CP, SOB, N/V/D.  Objective: Vital Signs: Blood pressure (!) 158/71, pulse (!) 52, temperature 98.7 F (37.1 C), temperature source Oral, resp. rate 16, height 5\' 6"  (1.676 m), weight 75.1 kg (165 lb 9.1 oz), SpO2 97 %. No results found.  Recent Labs  11/09/16 0500  WBC 9.4  HGB 10.7*  HCT 32.8*  PLT 224    Recent Labs  11/09/16 0500 11/10/16 1055  NA 137 139  K 4.1 4.3  CL 104 105  GLUCOSE 154* 98  BUN 35* 37*  CREATININE 2.03* 2.31*  CALCIUM 8.9 9.4   CBG (last 3)   Recent Labs  11/10/16 1119 11/10/16 1651 11/10/16 2110  GLUCAP 90 197* 258*    Wt Readings from Last 3 Encounters:  11/10/16 75.1 kg (165 lb 9.1 oz)  11/04/16 80.1 kg (176 lb 9.6 oz)  04/02/16 74.3 kg (163 lb 11.2 oz)    Physical Exam:  BP (!) 158/71 (BP Location: Left Arm)   Pulse (!) 52   Temp 98.7 F (37.1 C) (Oral)   Resp 16   Ht 5\' 6"  (1.676 m)   Wt 75.1 kg (165 lb 9.1 oz)   SpO2 97%   BMI 26.72 kg/m  Constitutional: John Jacobson appears well-developed. NAD. HENT: Normocephalicand atraumatic.  Eyes: EOMI. No discharge.  Cardiovascular: RRR. No JVD. Respiratory: Breath sounds normal. Unlabored.  GI: Soft. Bowel sounds are normal. Musc: Left hand intrinsic atrophy.  Neurological: John Jacobson is alertand oriented.  Follows simple commands.  Reasonable insight and awarenes Ataxia left arm and leg. Motor 5/5 proximal to distal RUE and RLE.  4+/5 proximal to distal LUE and LLE (unchanged) Psychiatric: John Jacobson has a normal mood and affect. His behavior is normal.  Skin. Warm and dry  Assessment/Plan: 1. Functional deficits secondary to left cerebellar, midbrain and pontine infarct which require 3+ hours per day of interdisciplinary therapy in a comprehensive inpatient rehab setting. Physiatrist is providing close team supervision and 24 hour management of active medical  problems listed below. Physiatrist and rehab team continue to assess barriers to discharge/monitor patient progress toward functional and medical goals.  Function:  Bathing Bathing position      Bathing parts Body parts bathed by patient: Right arm, Left arm, Chest, Abdomen, Right upper leg, Left upper leg, Front perineal area, Buttocks, Right lower leg, Left lower leg Body parts bathed by helper: Back  Bathing assist Assist Level: More than reasonable time      Upper Body Dressing/Undressing Upper body dressing   What is the patient wearing?: Pull over shirt/dress     Pull over shirt/dress - Perfomed by patient: Thread/unthread right sleeve, Thread/unthread left sleeve, Pull shirt over trunk, Put head through opening          Upper body assist Assist Level: No help, No cues      Lower Body Dressing/Undressing Lower body dressing   What is the patient wearing?: Pants, Underwear, Socks, Shoes Underwear - Performed by patient: Thread/unthread right underwear leg, Pull underwear up/down, Thread/unthread left underwear leg   Pants- Performed by patient: Thread/unthread right pants leg, Thread/unthread left pants leg, Pull pants up/down, Fasten/unfasten pants       Socks - Performed by patient: Don/doff left sock, Don/doff right sock   Shoes - Performed by patient: Don/doff right shoe, Don/doff left shoe, Fasten left, Fasten right  Lower body assist Assist for lower body dressing: No Help, No cues      Toileting Toileting   Toileting steps completed by patient: Performs perineal hygiene, Adjust clothing after toileting, Adjust clothing prior to toileting      Toileting assist Assist level: No help/no cues   Transfers Chair/bed transfer   Chair/bed transfer method: Stand pivot, Ambulatory Chair/bed transfer assist level: No Help, no cues, assistive device, takes more than a reasonable amount of time Chair/bed transfer assistive device: Armrests, Environmental health practitioner     Max distance: 121ft  Assist level: No help, No cues, assistive device, takes more than a reasonable amount of time   Wheelchair     Max wheelchair distance:  (100 ft) Assist Level: Touching or steadying assistance (Pt > 75%)  Cognition Comprehension Comprehension assist level: Follows complex conversation/direction with extra time/assistive device  Expression Expression assist level: Expresses complex ideas: With extra time/assistive device  Social Interaction Social Interaction assist level: Interacts appropriately with others with medication or extra time (anti-anxiety, antidepressant).  Problem Solving Problem solving assist level: Solves complex problems: With extra time  Memory Memory assist level: Recognizes or recalls 90% of the time/requires cueing < 10% of the time    Medical Problem List and Plan: 1. Left hemiataxia and gait disturbancesecondary to left cerebellar, midbrain and pontine infarcts  D/C home today 2. DVT Prophylaxis/Anticoagulation: Subcutaneous heparin. Monitor platelet counts and any signs of bleeding 3. Pain Management/chronic back pain: Oxycodone as needed 4. Mood: Provide emotional support 5. Neuropsych: This patient iscapable of making decisions on hisown behalf. 6. Skin/Wound Care: Routine skin checks 7. Fluids/Electrolytes/Nutrition: Routine I&Os 8.Diabetes mellitus and peripheral neuropathy and retinopathy.   Hemoglobin A1c 8.6. Lantus insulin 10 units daily at bedtime. Glucotrol 10 mg daily Check CBGs before meals and at bedtime. Diabetic teaching   Eye injections as outpt  Continues to be labile, will need ambulatory adjustments.    Will cont to monitor 9.Hypertension with history of right bundle branch block. Clonidine 0.3 mg 3 times a day, Lasix 40 mg daily, hydralazine 100 mg 3 times a day  Cardura 4mg  qhs  Cozaar 50 mg daily, increased to 75 on 5/28, increased to 100 on 5/29.   Monitor with increased  mobility  Renal artery U/S neg for stenosis  Nephro consulted, appreciate recs  With orthostasis 10.Chronic diastolic congestive heart failure. Continue Lasix. Monitor for any signs of fluid overload Filed Weights   11/08/16 0500 11/09/16 0430 11/10/16 0605  Weight: 81.8 kg (180 lb 4.8 oz) 82.4 kg (181 lb 10.5 oz) 75.1 kg (165 lb 9.1 oz)  11.CKD stage III. With AKI  Cr 2.03 on 5/30  Encourage fluids 12.Hypokalemia.   K+ 4.1 on 5/30 13.Tobacco abuse. Counseling 14.Constipation. Laxative assistance 15. Hep C  Cont Zepatier x1 weeks. 16. Leukocytosis: Resolved  WBCs 9.4 on 5/30  Afebrile  Cont to monitor 17. ABLA  Hb 10.7 on 5/30  LOS (Days) 7 A FACE TO FACE EVALUATION WAS PERFORMED  John Jacobson 11/11/2016 6:52 AM

## 2016-11-11 NOTE — Progress Notes (Signed)
Speech Language Pathology Discharge Summary  Patient Details  Name: John Jacobson MRN: [id number] Date of Birth: [birth year]   Patient has met 2 of 2 long term goals.  Patient to discharge at overall Modified Independent level.   Reasons goals not met: N/A   Clinical Impression/Discharge Summary: Patient has made excellent gains and has met 2 of 2 LTG's this admission due to improved cognitive function. Currently, patient is Mod I for divided attention and recall of functional information. Patient education is complete and patient will discharge home. Patient is at his baseline level of cognitive functioning, therefore, skilled SLP f/u is not warranted at this time.    Recommendation:  None      Equipment: N/A   Reasons for discharge: Treatment goals met;Discharged from hospital   Patient/Family Agrees with Progress Made and Goals Achieved: Yes     Weston Anna, [place], [place]    [place], [place] 11/11/2016, 7:07 AM

## 2016-11-14 ENCOUNTER — Emergency Department (HOSPITAL_COMMUNITY): Payer: Medicaid Other

## 2016-11-14 ENCOUNTER — Observation Stay (HOSPITAL_COMMUNITY)
Admission: EM | Admit: 2016-11-14 | Discharge: 2016-11-16 | Disposition: A | Discharge status: Home / Self Care | Payer: Medicaid Other | Attending: Family Medicine | Admitting: Family Medicine

## 2016-11-14 ENCOUNTER — Encounter (HOSPITAL_COMMUNITY): Payer: Self-pay | Admitting: *Deleted

## 2016-11-14 ENCOUNTER — Telehealth: Payer: Self-pay | Admitting: Neurology

## 2016-11-14 DIAGNOSIS — D649 Anemia, unspecified: Secondary | ICD-10-CM | POA: Diagnosis present

## 2016-11-14 DIAGNOSIS — N183 Chronic kidney disease, stage 3 unspecified: Secondary | ICD-10-CM | POA: Diagnosis present

## 2016-11-14 DIAGNOSIS — E1122 Type 2 diabetes mellitus with diabetic chronic kidney disease: Secondary | ICD-10-CM | POA: Diagnosis not present

## 2016-11-14 DIAGNOSIS — I5032 Chronic diastolic (congestive) heart failure: Secondary | ICD-10-CM | POA: Diagnosis not present

## 2016-11-14 DIAGNOSIS — E1165 Type 2 diabetes mellitus with hyperglycemia: Secondary | ICD-10-CM | POA: Diagnosis not present

## 2016-11-14 DIAGNOSIS — I639 Cerebral infarction, unspecified: Secondary | ICD-10-CM | POA: Diagnosis present

## 2016-11-14 DIAGNOSIS — N184 Chronic kidney disease, stage 4 (severe): Secondary | ICD-10-CM | POA: Diagnosis present

## 2016-11-14 DIAGNOSIS — H538 Other visual disturbances: Secondary | ICD-10-CM | POA: Diagnosis not present

## 2016-11-14 DIAGNOSIS — E1129 Type 2 diabetes mellitus with other diabetic kidney complication: Secondary | ICD-10-CM | POA: Diagnosis present

## 2016-11-14 DIAGNOSIS — B1920 Unspecified viral hepatitis C without hepatic coma: Secondary | ICD-10-CM | POA: Diagnosis present

## 2016-11-14 DIAGNOSIS — Z8673 Personal history of transient ischemic attack (TIA), and cerebral infarction without residual deficits: Secondary | ICD-10-CM | POA: Insufficient documentation

## 2016-11-14 DIAGNOSIS — I10 Essential (primary) hypertension: Secondary | ICD-10-CM | POA: Diagnosis present

## 2016-11-14 DIAGNOSIS — I160 Hypertensive urgency: Secondary | ICD-10-CM

## 2016-11-14 DIAGNOSIS — Z888 Allergy status to other drugs, medicaments and biological substances status: Secondary | ICD-10-CM | POA: Insufficient documentation

## 2016-11-14 DIAGNOSIS — Z91013 Allergy to seafood: Secondary | ICD-10-CM | POA: Insufficient documentation

## 2016-11-14 DIAGNOSIS — E113592 Type 2 diabetes mellitus with proliferative diabetic retinopathy without macular edema, left eye: Secondary | ICD-10-CM | POA: Diagnosis not present

## 2016-11-14 DIAGNOSIS — Z794 Long term (current) use of insulin: Secondary | ICD-10-CM | POA: Diagnosis not present

## 2016-11-14 DIAGNOSIS — Z7982 Long term (current) use of aspirin: Secondary | ICD-10-CM | POA: Diagnosis not present

## 2016-11-14 DIAGNOSIS — R42 Dizziness and giddiness: Secondary | ICD-10-CM | POA: Diagnosis not present

## 2016-11-14 DIAGNOSIS — N1832 Chronic kidney disease, stage 3b: Secondary | ICD-10-CM | POA: Diagnosis present

## 2016-11-14 DIAGNOSIS — I130 Hypertensive heart and chronic kidney disease with heart failure and stage 1 through stage 4 chronic kidney disease, or unspecified chronic kidney disease: Secondary | ICD-10-CM | POA: Insufficient documentation

## 2016-11-14 DIAGNOSIS — F17200 Nicotine dependence, unspecified, uncomplicated: Secondary | ICD-10-CM | POA: Insufficient documentation

## 2016-11-14 DIAGNOSIS — E1142 Type 2 diabetes mellitus with diabetic polyneuropathy: Secondary | ICD-10-CM | POA: Diagnosis not present

## 2016-11-14 DIAGNOSIS — E1151 Type 2 diabetes mellitus with diabetic peripheral angiopathy without gangrene: Secondary | ICD-10-CM | POA: Diagnosis not present

## 2016-11-14 DIAGNOSIS — Z79899 Other long term (current) drug therapy: Secondary | ICD-10-CM | POA: Insufficient documentation

## 2016-11-14 DIAGNOSIS — E785 Hyperlipidemia, unspecified: Secondary | ICD-10-CM | POA: Insufficient documentation

## 2016-11-14 DIAGNOSIS — Z7902 Long term (current) use of antithrombotics/antiplatelets: Secondary | ICD-10-CM | POA: Diagnosis not present

## 2016-11-14 DIAGNOSIS — I951 Orthostatic hypotension: Secondary | ICD-10-CM | POA: Insufficient documentation

## 2016-11-14 DIAGNOSIS — I5033 Acute on chronic diastolic (congestive) heart failure: Secondary | ICD-10-CM | POA: Diagnosis present

## 2016-11-14 LAB — COMPREHENSIVE METABOLIC PANEL
ALT: 25 U/L (ref 17–63)
ANION GAP: 9 (ref 5–15)
AST: 22 U/L (ref 15–41)
Albumin: 3.3 g/dL — ABNORMAL LOW (ref 3.5–5.0)
Alkaline Phosphatase: 88 U/L (ref 38–126)
BUN: 32 mg/dL — ABNORMAL HIGH (ref 6–20)
CALCIUM: 9.1 mg/dL (ref 8.9–10.3)
CHLORIDE: 100 mmol/L — AB (ref 101–111)
CO2: 26 mmol/L (ref 22–32)
CREATININE: 2.24 mg/dL — AB (ref 0.61–1.24)
GFR calc Af Amer: 34 mL/min — ABNORMAL LOW (ref >60)
GFR, EST NON AFRICAN AMERICAN: 30 mL/min — AB (ref >60)
Glucose, Bld: 195 mg/dL — ABNORMAL HIGH (ref 65–99)
Potassium: 3.9 mmol/L (ref 3.5–5.1)
SODIUM: 135 mmol/L (ref 135–145)
Total Bilirubin: 0.7 mg/dL (ref 0.3–1.2)
Total Protein: 7.2 g/dL (ref 6.5–8.1)

## 2016-11-14 LAB — CBC
HEMATOCRIT: 31.8 % — AB (ref 39.0–52.0)
Hemoglobin: 10.6 g/dL — ABNORMAL LOW (ref 13.0–17.0)
MCH: 29.8 pg (ref 26.0–34.0)
MCHC: 33.3 g/dL (ref 30.0–36.0)
MCV: 89.3 fL (ref 78.0–100.0)
PLATELETS: 342 10*3/uL (ref 150–400)
RBC: 3.56 MIL/uL — ABNORMAL LOW (ref 4.22–5.81)
RDW: 13.8 % (ref 11.5–15.5)
WBC: 10.6 10*3/uL — ABNORMAL HIGH (ref 4.0–10.5)

## 2016-11-14 LAB — DIFFERENTIAL
BASOS PCT: 0 %
Basophils Absolute: 0 10*3/uL (ref 0.0–0.1)
EOS PCT: 4 %
Eosinophils Absolute: 0.4 10*3/uL (ref 0.0–0.7)
Lymphocytes Relative: 32 %
Lymphs Abs: 3.4 10*3/uL (ref 0.7–4.0)
MONOS PCT: 11 %
Monocytes Absolute: 1.2 10*3/uL — ABNORMAL HIGH (ref 0.1–1.0)
Neutro Abs: 5.6 10*3/uL (ref 1.7–7.7)
Neutrophils Relative %: 53 %

## 2016-11-14 LAB — I-STAT CHEM 8, ED
BUN: 39 mg/dL — ABNORMAL HIGH (ref 6–20)
CALCIUM ION: 1.19 mmol/L (ref 1.15–1.40)
CHLORIDE: 98 mmol/L — AB (ref 101–111)
Creatinine, Ser: 2.3 mg/dL — ABNORMAL HIGH (ref 0.61–1.24)
GLUCOSE: 198 mg/dL — AB (ref 65–99)
HCT: 32 % — ABNORMAL LOW (ref 39.0–52.0)
HEMOGLOBIN: 10.9 g/dL — AB (ref 13.0–17.0)
POTASSIUM: 4.1 mmol/L (ref 3.5–5.1)
SODIUM: 138 mmol/L (ref 135–145)
TCO2: 30 mmol/L (ref 0–100)

## 2016-11-14 LAB — I-STAT TROPONIN, ED: TROPONIN I, POC: 0.01 ng/mL (ref 0.00–0.08)

## 2016-11-14 LAB — APTT: aPTT: 32 seconds (ref 24–36)

## 2016-11-14 LAB — PROTIME-INR
INR: 1.13
PROTHROMBIN TIME: 14.6 s (ref 11.4–15.2)

## 2016-11-14 NOTE — ED Triage Notes (Signed)
Pt reports being discharged on Friday following stroke. Pt states having increase in dizziness since Friday. Has pressure in his head, left side and blurred vision left eye. States that his dizziness has been getting progressively worse.

## 2016-11-14 NOTE — Telephone Encounter (Signed)
I returned call from answering machine about patient`s daughter calling about him having severe dizziness. She told me they were in ER at [facility] and awaiting evaluation. I complimented her upon her prompt decision and await evaluation by EDP. Incase he gets admitted Dr Erlinda Hong can f/u in morning

## 2016-11-15 ENCOUNTER — Ambulatory Visit: Payer: Medicaid Other | Admitting: Physical Therapy

## 2016-11-15 ENCOUNTER — Encounter (HOSPITAL_COMMUNITY): Payer: Self-pay | Admitting: General Practice

## 2016-11-15 ENCOUNTER — Observation Stay (HOSPITAL_COMMUNITY): Payer: Medicaid Other

## 2016-11-15 ENCOUNTER — Ambulatory Visit: Payer: Medicaid Other | Admitting: Occupational Therapy

## 2016-11-15 DIAGNOSIS — B182 Chronic viral hepatitis C: Secondary | ICD-10-CM

## 2016-11-15 DIAGNOSIS — I639 Cerebral infarction, unspecified: Secondary | ICD-10-CM | POA: Diagnosis present

## 2016-11-15 DIAGNOSIS — Z794 Long term (current) use of insulin: Secondary | ICD-10-CM

## 2016-11-15 DIAGNOSIS — N183 Chronic kidney disease, stage 3 (moderate): Secondary | ICD-10-CM

## 2016-11-15 DIAGNOSIS — I10 Essential (primary) hypertension: Secondary | ICD-10-CM

## 2016-11-15 DIAGNOSIS — I5032 Chronic diastolic (congestive) heart failure: Secondary | ICD-10-CM | POA: Diagnosis not present

## 2016-11-15 DIAGNOSIS — D649 Anemia, unspecified: Secondary | ICD-10-CM

## 2016-11-15 DIAGNOSIS — Z8673 Personal history of transient ischemic attack (TIA), and cerebral infarction without residual deficits: Secondary | ICD-10-CM

## 2016-11-15 DIAGNOSIS — E1121 Type 2 diabetes mellitus with diabetic nephropathy: Secondary | ICD-10-CM

## 2016-11-15 DIAGNOSIS — I160 Hypertensive urgency: Secondary | ICD-10-CM

## 2016-11-15 LAB — GLUCOSE, CAPILLARY
GLUCOSE-CAPILLARY: 158 mg/dL — AB (ref 65–99)
GLUCOSE-CAPILLARY: 166 mg/dL — AB (ref 65–99)
Glucose-Capillary: 233 mg/dL — ABNORMAL HIGH (ref 65–99)
Glucose-Capillary: 264 mg/dL — ABNORMAL HIGH (ref 65–99)

## 2016-11-15 MED ORDER — CLOPIDOGREL BISULFATE 75 MG PO TABS
75.0000 mg | ORAL_TABLET | Freq: Every day | ORAL | Status: DC
  Administered 2016-11-15 – 2016-11-16 (×2): 75 mg via ORAL
  Filled 2016-11-15 (×2): qty 1

## 2016-11-15 MED ORDER — ACETAMINOPHEN 325 MG PO TABS: 650.0000 mg | ORAL_TABLET | ORAL | Status: DC | PRN

## 2016-11-15 MED ORDER — HYDRALAZINE HCL 50 MG PO TABS: 100.0000 mg | ORAL_TABLET | Freq: Four times a day (QID) | ORAL | Status: DC | PRN

## 2016-11-15 MED ORDER — ACETAMINOPHEN 160 MG/5ML PO SOLN: 650.0000 mg | ORAL | Status: DC | PRN

## 2016-11-15 MED ORDER — SENNA 8.6 MG PO TABS: 1.0000 | ORAL_TABLET | Freq: Every day | ORAL | Status: DC | PRN

## 2016-11-15 MED ORDER — HYDRALAZINE HCL 50 MG PO TABS
100.0000 mg | ORAL_TABLET | Freq: Three times a day (TID) | ORAL | Status: DC
  Administered 2016-11-15 – 2016-11-16 (×4): 100 mg via ORAL
  Filled 2016-11-15 (×4): qty 2

## 2016-11-15 MED ORDER — INSULIN ASPART 100 UNIT/ML ~~LOC~~ SOLN
0.0000 [IU] | Freq: Every day | SUBCUTANEOUS | Status: DC
  Administered 2016-11-15: 3 [IU] via SUBCUTANEOUS

## 2016-11-15 MED ORDER — HYDRALAZINE HCL 25 MG PO TABS
100.0000 mg | ORAL_TABLET | Freq: Once | ORAL | Status: AC
  Administered 2016-11-15: 100 mg via ORAL
  Filled 2016-11-15: qty 4

## 2016-11-15 MED ORDER — ATORVASTATIN CALCIUM 10 MG PO TABS
20.0000 mg | ORAL_TABLET | Freq: Every day | ORAL | Status: DC
  Administered 2016-11-15: 20 mg via ORAL
  Filled 2016-11-15: qty 2

## 2016-11-15 MED ORDER — HYDRALAZINE HCL 50 MG PO TABS
100.0000 mg | ORAL_TABLET | Freq: Four times a day (QID) | ORAL | Status: DC | PRN
  Administered 2016-11-15: 100 mg via ORAL
  Filled 2016-11-15: qty 2

## 2016-11-15 MED ORDER — STROKE: EARLY STAGES OF RECOVERY BOOK
Freq: Once | Status: AC
  Administered 2016-11-15: 1
  Filled 2016-11-15: qty 1

## 2016-11-15 MED ORDER — ACETAMINOPHEN 650 MG RE SUPP: 650.0000 mg | RECTAL | Status: DC | PRN

## 2016-11-15 MED ORDER — INSULIN GLARGINE 100 UNIT/ML ~~LOC~~ SOLN
10.0000 [IU] | Freq: Every day | SUBCUTANEOUS | Status: DC
  Administered 2016-11-15: 10 [IU] via SUBCUTANEOUS
  Filled 2016-11-15: qty 0.1

## 2016-11-15 MED ORDER — ASPIRIN EC 325 MG PO TBEC
325.0000 mg | DELAYED_RELEASE_TABLET | Freq: Every day | ORAL | Status: DC
  Administered 2016-11-15 – 2016-11-16 (×2): 325 mg via ORAL
  Filled 2016-11-15 (×2): qty 1

## 2016-11-15 MED ORDER — ELBASVIR-GRAZOPREVIR 50-100 MG PO TABS: 1.0000 | ORAL_TABLET | Freq: Every day | ORAL | Status: DC

## 2016-11-15 MED ORDER — DOXAZOSIN MESYLATE 2 MG PO TABS
2.0000 mg | ORAL_TABLET | Freq: Two times a day (BID) | ORAL | Status: DC
  Administered 2016-11-15 – 2016-11-16 (×3): 2 mg via ORAL
  Filled 2016-11-15 (×4): qty 1

## 2016-11-15 MED ORDER — ENOXAPARIN SODIUM 40 MG/0.4ML ~~LOC~~ SOLN
40.0000 mg | SUBCUTANEOUS | Status: DC
  Administered 2016-11-15 – 2016-11-16 (×2): 40 mg via SUBCUTANEOUS
  Filled 2016-11-15 (×2): qty 0.4

## 2016-11-15 MED ORDER — CLONIDINE HCL 0.1 MG PO TABS
0.3000 mg | ORAL_TABLET | Freq: Three times a day (TID) | ORAL | Status: DC
Start: 2016-11-15 — End: 2016-11-16
  Administered 2016-11-15 – 2016-11-16 (×4): 0.3 mg via ORAL
  Filled 2016-11-15 (×4): qty 3

## 2016-11-15 MED ORDER — FUROSEMIDE 40 MG PO TABS
40.0000 mg | ORAL_TABLET | Freq: Every day | ORAL | Status: DC
Start: 2016-11-15 — End: 2016-11-16
  Administered 2016-11-15: 40 mg via ORAL
  Filled 2016-11-15 (×2): qty 1

## 2016-11-15 MED ORDER — POLYETHYLENE GLYCOL 3350 17 G PO PACK: 17.0000 g | PACK | Freq: Every day | ORAL | Status: DC | PRN

## 2016-11-15 MED ORDER — OXYCODONE HCL 5 MG PO TABS: 5.0000 mg | ORAL_TABLET | ORAL | Status: DC | PRN

## 2016-11-15 MED ORDER — INSULIN ASPART 100 UNIT/ML ~~LOC~~ SOLN
0.0000 [IU] | Freq: Three times a day (TID) | SUBCUTANEOUS | Status: DC
  Administered 2016-11-15 (×2): 3 [IU] via SUBCUTANEOUS
  Administered 2016-11-15: 5 [IU] via SUBCUTANEOUS
  Administered 2016-11-16: 3 [IU] via SUBCUTANEOUS
  Administered 2016-11-16: 5 [IU] via SUBCUTANEOUS

## 2016-11-15 MED ORDER — CARVEDILOL 3.125 MG PO TABS: 3.1250 mg | ORAL_TABLET | Freq: Two times a day (BID) | ORAL | Status: DC

## 2016-11-15 MED ORDER — CARVEDILOL 6.25 MG PO TABS
6.2500 mg | ORAL_TABLET | Freq: Two times a day (BID) | ORAL | Status: DC
  Administered 2016-11-15 – 2016-11-16 (×3): 6.25 mg via ORAL
  Filled 2016-11-15 (×3): qty 1

## 2016-11-15 MED ORDER — LOSARTAN POTASSIUM 50 MG PO TABS
100.0000 mg | ORAL_TABLET | Freq: Every day | ORAL | Status: DC
  Administered 2016-11-15 – 2016-11-16 (×2): 100 mg via ORAL
  Filled 2016-11-15 (×2): qty 2

## 2016-11-15 NOTE — Progress Notes (Signed)
STROKE TEAM PROGRESS NOTE   HISTORY OF PRESENT ILLNESS (per record) John Jacobson is a 61 y.o. male recently admitted from 5/24 until 5/25 for left superior cerebellar artery infarct with d/c to CIR and eventual return home 11/11/2016. He was doing well following discharge, having some ataxia but overall doing well. Over the past 2 days, he has noticed dramatic increase in his vertigo and difficulty accomplishing his activities.  Workup included echo without embolic source, carotid Dopplers with no stenosis, LDL of 94, A1c of 8.6. He was discharged to CIR on aspirin and Plavix and statin was started.  He was LKW 11/14/2016, time unknown. Patient was not administered IV t-PA secondary to being outside of window. He was admitted for further evaluation and treatment.   SUBJECTIVE (INTERVAL HISTORY) Pt sitting in chair, no family at bedside. He stated that he felt dizziness, not vertigo yesterday at home with standing up. If he sat down or lay down, dizziness went away. He stood up again, he would have dizziness again. Sounds like position related. MRI no acute stroke, however, BP was high on presentation.    OBJECTIVE Temp:  [97.7 F (36.5 C)-98.3 F (36.8 C)] 98.3 F (36.8 C) (06/05 0901) Pulse Rate:  [44-74] 74 (06/05 0901) Cardiac Rhythm: Sinus bradycardia (06/05 0720) Resp:  [15-20] 20 (06/05 0901) BP: (165-231)/(79-100) 167/84 (06/05 0901) SpO2:  [96 %-100 %] 100 % (06/05 0901) Weight:  [76.4 kg (168 lb 6.9 oz)] 76.4 kg (168 lb 6.9 oz) (06/05 0238)  CBC:  Recent Labs Lab 11/09/16 0500 11/14/16 1852 11/14/16 1915  WBC 9.4 10.6*  --   NEUTROABS 5.5 5.6  --   HGB 10.7* 10.6* 10.9*  HCT 32.8* 31.8* 32.0*  MCV 90.4 89.3  --   PLT 224 342  --     Basic Metabolic Panel:  Recent Labs Lab 11/09/16 0500  11/11/16 0848 11/14/16 1852 11/14/16 1915  NA 137  < > 134* 135 138  K 4.1  < > 4.1 3.9 4.1  CL 104  < > 101 100* 98*  CO2 26  < > 24 26  --   GLUCOSE 154*  < > 204* 195*  198*  BUN 35*  < > 33* 32* 39*  CREATININE 2.03*  < > 2.23* 2.24* 2.30*  CALCIUM 8.9  < > 9.4 9.1  --   PHOS 3.9  --  3.8  --   --   < > = values in this interval not displayed.  Lipid Panel:    Component Value Date/Time   CHOL 149 11/03/2016 0604   TRIG 91 11/03/2016 0604   HDL 37 (L) 11/03/2016 0604   CHOLHDL 4.0 11/03/2016 0604   VLDL 18 11/03/2016 0604   LDLCALC 94 11/03/2016 0604   HgbA1c:  Lab Results  Component Value Date   HGBA1C 8.6 (H) 11/03/2016   Urine Drug Screen:    Component Value Date/Time   LABOPIA POSITIVE (A) 03/29/2016 0751   COCAINSCRNUR NONE DETECTED 03/29/2016 0751   LABBENZ NONE DETECTED 03/29/2016 0751   AMPHETMU NONE DETECTED 03/29/2016 0751   THCU NONE DETECTED 03/29/2016 0751   LABBARB NONE DETECTED 03/29/2016 0751    Alcohol Level No results found for: [place] I have personally reviewed the radiological images below and agree with the radiology interpretations.  Ct Head Wo Contrast 11/14/2016 1. No acute intracranial pathology. 2. Subacute left pontine and left cerebellar infarcts.   Mr Brain Wo Contrast 11/15/2016 1. Normal expected interval evolution of  patchy left pontine and left cerebellar infarcts. 2. Otherwise stable appearance of the brain. Stable chronic microvascular ischemic disease. No other acute intracranial infarct or other process identified. 3. 3 mm pituitary lesion, indeterminate. Again, this could be further assessed with dedicated nonemergent pituitary mass protocol MRI as desired.    PHYSICAL EXAM  Temp:  [97.7 F (36.5 C)-98.3 F (36.8 C)] 97.7 F (36.5 C) (06/05 1207) Pulse Rate:  [44-74] 59 (06/05 1207) Resp:  [15-20] 20 (06/05 1207) BP: (163-231)/(79-100) 163/81 (06/05 1207) SpO2:  [96 %-100 %] 99 % (06/05 1207) Weight:  [168 lb 6.9 oz (76.4 kg)] 168 lb 6.9 oz (76.4 kg) (06/05 0238)  General - Well nourished, well developed, in no apparent distress.  Ophthalmologic - Fundi not visualized due to eye  movement  Cardiovascular - Regular rate and rhythm.  Mental Status -  Level of arousal and orientation to time, place, and person were intact. Language including expression, naming, repetition, comprehension was assessed and found intact. Fund of Knowledge was assessed and was intact.  Cranial Nerves II - XII - II - Visual field intact OU. III, IV, VI - Extraocular movements intact. V - Facial sensation intact bilaterally. VII - Facial movement intact bilaterally. VIII - Hearing & vestibular intact bilaterally. X - Palate elevates symmetrically. XI - Chin turning & shoulder shrug intact bilaterally. XII - Tongue protrusion intact.  Motor Strength - The patient's strength was normal in all extremities and pronator drift was absent.  Bulk was normal and fasciculations were absent.   Motor Tone - Muscle tone was assessed at the neck and appendages and was normal.  Reflexes - The patient's reflexes were 1+ in all extremities and he had no pathological reflexes.  Sensory - Light touch, temperature/pinprick were assessed and were symmetrical.    Coordination - The patient had normal movements in the hands with no ataxia or dysmetria.  Tremor was absent.  Gait and Station - deferred   ASSESSMENT/PLAN Mr. ELWARD NOCERA is a 61 y.o. male with history of recent L superior cerebellar infarct right bundle branch block, chronic kidney disease, diastolic congestive heart failure, tobacco use, chronic back pain, `hypertension, diabetes, and asthma  presenting with a dramatic increase in vertigo and difficulty accomplishing tasks. He did not receive IV t-PA due to delay in arrival.   Dizziness likely due to hypertensive urgency vs. Orthostasis  Resultant dizziness resolved with sitting down  CT head no acute stroke. Subacute L pontine and L cerebellar infarcts  MRI head expected evolution pathcy L pontine and L cerebellar infarcts. Small vessel disease. 37mm pituitary lesion,  indeterminate.  LDL 94  HgbA1c 8.6 in May  Lovenox 40 mg sq daily for VTE prophylaxis  Diet heart healthy/carb modified Room service appropriate? Yes; Fluid consistency: Thin  aspirin 325 mg daily and clopidogrel 75 mg daily prior to admission, now on aspirin 325 mg daily and clopidogrel 75 mg daily. Continue DAPT on discharge  Patient counseled to be compliant with his antithrombotic medications  Ongoing aggressive stroke risk factor management  Therapy recommendations:  OP PT   Disposition:  pending   Hypertensive urgency  BP 215/100 on arrival  Stabilized this am at 167/84  Resumed all his home medication this time  BP goal normotensive  ? 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 Please call with questions. Pt will follow up with Dr. Erlinda Hong at [facility] on 01/16/17. Thanks for the consult.  Rosalin Hawking, MD PhD Stroke Neurology 11/15/2016 12:36 PM  To contact Stroke Continuity provider, please refer to [email or web address] After hours, contact General Neurology

## 2016-11-15 NOTE — Progress Notes (Signed)
Patient arrived around 18 no new neurological deficits was in hospital a week ago with stroke MRI was negative for any new stroke. Daughter was with but left his SBP is running high 224/84 gave him PO hydralazine 100 mg, oriented X4 discussed safety procedures.

## 2016-11-15 NOTE — Progress Notes (Addendum)
@IPLOG         PROGRESS NOTE                                                                                                                                                                                                             Patient Demographics:    John Jacobson, is a 61 y.o. male, DOB - [birth year], OEV:[id number]  Admit date - 11/14/2016   Admitting Physician Edwin Dada, MD  Outpatient Primary MD for the patient is Patient, No Pcp Per  LOS - 0  Chief Complaint  Patient presents with  . Dizziness       Brief Narrative   John Jacobson is a 61 y.o. male with a past medical history significant for IDDM, HTN, CKD III baseline Cr 2.2, hep C, HFpEF and recent stroke who presents with vertigo.  The patient was recently admitted 5/23-5/25 for new L cerebellar stroke, was in inpatient rehab one week, and home last Friday on new aspirin 325 + Plavix.  Had an MRA during that hospitalization that showed severe L SCA stenosis.  Was also discharged back on clonidine, doxazosin, hydralazine, furosemide and losartan and discharge BP from inpatient rehab was 829 systolic.  Over the weekend the patient was fine until Sunday at the grocery store, he noticed vertigo with standing up after leaning over to get something from a low shelf.  He describes this as "moving around in my head" or spinning sensation, workup here showed no new CVA but poorly controlled blood pressure.    Subjective:    John Jacobson today has, No headache, No chest pain, No abdominal pain - No Nausea, No new weakness tingling or numbness, No Cough - SOB. Does have some ongoing dry mouth since he has been on present medications started last admission.   Assessment  & Plan :     1.Severe dizziness could be due to hypertensive encephalopathy. History of recent stroke with severe L. SCA stenosis - likely symptoms due to hypertensive encephalopathy, he is already on multiple blood pressure medications which include  clonidine, hydralazine, ARB, Lasix, Cardura, will add Coreg moderate dose and monitor blood pressure. Neuro team following.  2. Poorly controlled blood pressure. Plan as above.  3. Recent history of stroke, continue dual antiplatelet therapy and statin. No new CVA. Supportive care, PT OT to evaluate, has mild left-sided residual weakness.  4. Dyslipidemia. Continue home dose statin.  5. DM type II. On Lantus and sliding scale. Continue to monitor CBGs.  Lab Results  Component Value  Date   HGBA1C 8.6 (H) 11/03/2016   CBG (last 3)   Recent Labs  11/15/16 0616  GLUCAP 233*    6. CKD 4. Baseline creatinine anywhere between 2.2-2.5. At baseline continue to monitor, patient on ARB and diuretics.  7. History of chronic Hep C  - undergoing antiviral treatment, continue. Finish course.  8. Dry mouth since last admission a few weeks ago, pharmacy requested to review home meds.  9. Chronic grade 1 diastolic dysfunction EF 93%. Currently compensated, continue home dose Lasix.      Diet : Diet heart healthy/carb modified Room service appropriate? Yes; Fluid consistency: Thin    Family Communication  :  None  Code Status :  Full  Disposition Plan  :  Home  Consults  :  Neuro  Procedures  :    MRI brain. No acute changes.  DVT Prophylaxis  :  Lovenox    Lab Results  Component Value Date   PLT 342 11/14/2016    Inpatient Medications  Scheduled Meds: . aspirin  325 mg Oral Daily  . atorvastatin  20 mg Oral q1800  . carvedilol  6.25 mg Oral BID WC  . cloNIDine  0.3 mg Oral TID  . clopidogrel  75 mg Oral Daily  . doxazosin  2 mg Oral BID  . Elbasvir-Grazoprevir  1 tablet Oral Daily  . enoxaparin (LOVENOX) injection  40 mg Subcutaneous Q24H  . furosemide  40 mg Oral Daily  . hydrALAZINE  100 mg Oral TID  . insulin aspart  0-15 Units Subcutaneous TID WC  . insulin aspart  0-5 Units Subcutaneous QHS  . insulin glargine  10 Units Subcutaneous QHS  . losartan  100 mg  Oral Daily   Continuous Infusions: PRN Meds:.[DISCONTINUED] acetaminophen **OR** [DISCONTINUED] acetaminophen (TYLENOL) oral liquid 160 mg/5 mL **OR** acetaminophen, polyethylene glycol, senna  Antibiotics  :    Anti-infectives    Start     Dose/Rate Route Frequency Ordered Stop   11/15/16 1000  Elbasvir-Grazoprevir 50-100 MG TABS 1 tablet     1 tablet Oral Daily 11/15/16 0344           Objective:   Vitals:   11/15/16 0238 11/15/16 0430 11/15/16 0630 11/15/16 0901  BP: (!) 224/84 (!) 178/84 (!) 190/90 (!) 167/84  Pulse: 61 67 (!) 58 74  Resp: 20 15 16 20   Temp: 97.7 F (36.5 C) 97.7 F (36.5 C) 97.8 F (36.6 C) 98.3 F (36.8 C)  TempSrc: Oral Oral Oral Oral  SpO2: 97% 97% 99% 100%  Weight: 76.4 kg (168 lb 6.9 oz)     Height: 5\' 7"  (1.702 m)       Wt Readings from Last 3 Encounters:  11/15/16 76.4 kg (168 lb 6.9 oz)  11/11/16 74.2 kg (163 lb 9.6 oz)  11/04/16 80.1 kg (176 lb 9.6 oz)    No intake or output data in the 24 hours ending 11/15/16 7902   Physical Exam  Awake Alert, Oriented X 3, No new F.N deficits, mild L sided weakness, Normal affect [place].AT,PERRAL Supple Neck,No JVD, No cervical lymphadenopathy appriciated.  Symmetrical Chest wall movement, Good air movement bilaterally, CTAB RRR,No Gallops,Rubs or new Murmurs, No Parasternal Heave +ve B.Sounds, Abd Soft, No tenderness, No organomegaly appriciated, No rebound - guarding or rigidity. No Cyanosis, Clubbing or edema, No new Rash or bruise     Data Review:    CBC  Recent Labs Lab 11/09/16 0500 11/14/16 1852 11/14/16 1915  WBC 9.4 10.6*  --  HGB 10.7* 10.6* 10.9*  HCT 32.8* 31.8* 32.0*  PLT 224 342  --   MCV 90.4 89.3  --   MCH 29.5 29.8  --   MCHC 32.6 33.3  --   RDW 14.1 13.8  --   LYMPHSABS 3.1 3.4  --   MONOABS 0.9 1.2*  --   EOSABS 0.4 0.4  --   BASOSABS 0.0 0.0  --     Chemistries   Recent Labs Lab 11/09/16 0500 11/10/16 1055 11/11/16 0848 11/14/16 1852 11/14/16 1915   NA 137 139 134* 135 138  K 4.1 4.3 4.1 3.9 4.1  CL 104 105 101 100* 98*  CO2 26 25 24 26   --   GLUCOSE 154* 98 204* 195* 198*  BUN 35* 37* 33* 32* 39*  CREATININE 2.03* 2.31* 2.23* 2.24* 2.30*  CALCIUM 8.9 9.4 9.4 9.1  --   AST  --   --   --  22  --   ALT  --   --   --  25  --   ALKPHOS  --   --   --  88  --   BILITOT  --   --   --  0.7  --    ------------------------------------------------------------------------------------------------------------------ No results for input(s): CHOL, HDL, LDLCALC, TRIG, CHOLHDL, LDLDIRECT in the last 72 hours.  Lab Results  Component Value Date   HGBA1C 8.6 (H) 11/03/2016   ------------------------------------------------------------------------------------------------------------------ No results for input(s): TSH, T4TOTAL, T3FREE, THYROIDAB in the last 72 hours.  Invalid input(s): FREET3 ------------------------------------------------------------------------------------------------------------------ No results for input(s): VITAMINB12, FOLATE, FERRITIN, TIBC, IRON, RETICCTPCT in the last 72 hours.  Coagulation profile  Recent Labs Lab 11/14/16 1852  INR 1.13    No results for input(s): DDIMER in the last 72 hours.  Cardiac Enzymes No results for input(s): CKMB, TROPONINI, MYOGLOBIN in the last 168 hours.  Invalid input(s): CK ------------------------------------------------------------------------------------------------------------------    Component Value Date/Time   BNP 201.3 (H) 03/31/2016 3557    Micro Results No results found for this or any previous visit (from the past 240 hour(s)).  Radiology Reports Dg Chest 2 View  Result Date: 11/02/2016 CLINICAL DATA:  61 y/o  M; weakness. EXAM: CHEST  2 VIEW COMPARISON:  03/28/2016 chest radiograph FINDINGS: Stable heart size and mediastinal contours are within normal limits given projection and technique. Both lungs are clear. Mild thoracic spine dextrocurvature. IMPRESSION:  No active cardiopulmonary disease. Electronically Signed   By: Kristine Garbe M.D.   On: 11/02/2016 20:52   Ct Head Wo Contrast  Result Date: 11/14/2016 CLINICAL DATA:  Dizziness, recent stroke EXAM: CT HEAD WITHOUT CONTRAST TECHNIQUE: Contiguous axial images were obtained from the base of the skull through the vertex without intravenous contrast. COMPARISON:  MR brain 11/03/2016 FINDINGS: Brain: No evidence of acute infarction, hemorrhage, extra-axial collection, ventriculomegaly, or mass effect. Subacute left cerebellar and left pontine infarct. Generalized cerebral atrophy. Periventricular white matter low attenuation likely secondary to microangiopathy. Vascular: Cerebrovascular atherosclerotic calcifications are noted. Skull: Negative for fracture or focal lesion. Sinuses/Orbits: Visualized portions of the orbits are unremarkable. Visualized portions of the paranasal sinuses and mastoid air cells are unremarkable. Other: None. IMPRESSION: 1. No acute intracranial pathology. 2. Subacute left pontine and left cerebellar infarcts. Electronically Signed   By: Kathreen Devoid   On: 11/14/2016 21:40   Ct Head Wo Contrast  Result Date: 11/02/2016 CLINICAL DATA:  Generalized weakness.  Difficulty walking EXAM: CT HEAD WITHOUT CONTRAST TECHNIQUE: Contiguous axial images were obtained from the base of  the skull through the vertex without intravenous contrast. COMPARISON:  None. FINDINGS: Brain: No acute intracranial hemorrhage. No focal mass lesion. No midline shift or mass effect. No hydrocephalus. Basilar cisterns are patent. There is hypodense region within the LEFT cerebellum adjacent the cerebral peduncle (image 11, series 3). This lesion measures approximately 1.7 x 1.4 cm. Vascular: No hyperdense vessel or unexpected calcification. Skull: Normal. Negative for fracture or focal lesion. Sinuses/Orbits: No acute finding. Other: None. IMPRESSION: Concern for LEFT cerebellar infarction.  Recommend brain  MRI. These results will be called to the ordering clinician or representative by the Radiologist Assistant, and communication documented in the PACS or zVision Dashboard. Electronically Signed   By: Suzy Bouchard M.D.   On: 11/02/2016 21:51   Mr Brain Wo Contrast  Result Date: 11/15/2016 CLINICAL DATA:  Initial evaluation for acute dizziness. EXAM: MRI HEAD WITHOUT CONTRAST TECHNIQUE: Multiplanar, multiecho pulse sequences of the brain and surrounding structures were obtained without intravenous contrast. COMPARISON:  Prior CT from 11/14/2016 as well as recent MRI from 11/03/2016. FINDINGS: Brain: Stable atrophy with chronic small vessel ischemic disease. Scatter remote lacunar infarct present within the bilateral basal ganglia/ corona radiata. Remote lacunar infarct present at the left thalamus. Normal expected interval evolution seen about the recently identified left cerebellar and left pontine infarcts. Small amount of associated petechial hemorrhage at the left cerebellar infarcts (series 8, image 36). No significant mass effect. No other abnormal foci of restricted diffusion to suggest acute ischemic infarct. Gray-white matter differentiation maintained. Additional small subcentimeter chronic microhemorrhage noted within the left periventricular white matter (series 8, image 71). No evidence for acute intracranial hemorrhage. No mass lesion, midline shift or mass effect. No hydrocephalus. No extra-axial fluid collection. Major dural sinuses are grossly patent. Tiny 3 mm T1 hyperintense lesion again noted within the posterior pituitary gland, indeterminate. Suprasellar region unremarkable. Midline structures intact and normal. Vascular: Major intracranial vascular flow voids are maintained. Skull and upper cervical spine: Craniocervical junction within normal limits. Multilevel degenerative spondylolysis noted within the upper cervical spine without significant stenosis. Bone marrow signal intensity  within normal limits. No scalp soft tissue abnormality. Sinuses/Orbits: Globes and orbital soft tissues within normal limits. Paranasal sinuses are grossly clear. No mastoid effusion. Inner ear structures normal. Other: None. IMPRESSION: 1. Normal expected interval evolution of patchy left pontine and left cerebellar infarcts. 2. Otherwise stable appearance of the brain. Stable chronic microvascular ischemic disease. No other acute intracranial infarct or other process identified. 3. 3 mm pituitary lesion, indeterminate. Again, this could be further assessed with dedicated nonemergent pituitary mass protocol MRI as desired. Electronically Signed   By: Jeannine Boga M.D.   On: 11/15/2016 02:03   Mr Brain Wo Contrast  Result Date: 11/03/2016 CLINICAL DATA:  Initial evaluation for acute dizziness. EXAM: MRI HEAD WITHOUT CONTRAST MRA HEAD WITHOUT CONTRAST TECHNIQUE: Multiplanar, multiecho pulse sequences of the brain and surrounding structures were obtained without intravenous contrast. Angiographic images of the head were obtained using MRA technique without contrast. COMPARISON:  Prior CT from 11/02/2016. FINDINGS: MRI HEAD FINDINGS Brain: Cerebral volume within normal limits. Few scatter remote lacunar infarcts noted within the left basal ganglia/ corona radiata. Additional remote lacunar infarct present within the left thalamus. Patchy restricted diffusion within the superior left cerebellar hemisphere, consistent with acute ischemic infarct. Additional patchy infarct within the left aspect of the midbrain/ pons (series 5, image 20). No associated hemorrhage or mass effect. Gray-white matter differentiation otherwise maintained. No other evidence for acute or subacute ischemia.  Few punctate chronic micro hemorrhages noted within the left centrum semi ovale. These may be hypertensive in nature. No mass lesion, midline shift or mass effect. Ventricles normal size without evidence for hydrocephalus. No  extra-axial fluid collection. Major dural sinuses are grossly patent. Pituitary gland of normal size. Tiny 3 mm T1 hyperintense lesion within the central aspect of the pituitary gland noted, indeterminate. Midline structures intact and normal. Vascular: Major intracranial vascular flow voids are maintained. Skull and upper cervical spine: Craniocervical junction normal. Visualized upper cervical spine unremarkable. Bone marrow signal intensity within normal limits. No scalp soft tissue abnormality. Sinuses/Orbits: Globes and orbital soft tissues within normal limits. Paranasal sinuses are clear. No mastoid effusion. Inner ear structures normal. Other: None. MRA HEAD FINDINGS ANTERIOR CIRCULATION: Distal cervical segments of the internal carotid arteries are patent with antegrade flow. Petrous segments patent bilaterally without stenosis. Multifocal atheromatous irregularity present throughout the carotid siphons with moderate multifocal irregular narrowing. ICA termini patent. Left A1 segment dominant. Right A1 segment hypoplastic and/ or absent. Atheromatous irregularity present throughout the anterior cerebral arteries without high-grade flow-limiting stenosis. ACA is are patent to their distal aspects. M1 segments patent without high-grade stenosis or occlusion. No proximal M2 occlusion. Distal small vessel atheromatous irregularity throughout the MCA branches bilaterally. POSTERIOR CIRCULATION: Vertebral arteries code dominant and widely patent to the vertebrobasilar junction. Right PICA patent proximally. Left PICA not visualized. Basilar artery widely patent. Right SCA irregular but patent to its distal aspect. Severe stenosis present within the proximal left SCA (series 455). Flow distally is severely attenuated. PCAs arise from the basilar artery. Mild to moderate multifocal atheromatous irregularity involving the PCAs bilaterally without high-grade flow-limiting stenosis. PCAs are patent to their distal  aspects. No aneurysm or vascular malformation. IMPRESSION: MRI HEAD IMPRESSION: 1. Patchy multifocal acute ischemic infarcts involving the superior left cerebellar hemisphere and left midbrain/pons. No associated hemorrhage or mass effect. 2. Scattered remote lacunar infarcts involving the bilateral basal ganglia/corona radiata as well as the left thalamus. 3. 3 mm pituitary lesion, indeterminate. Finding could be further assessed with nonemergent pituitary mass protocol MRI. MRA HEAD IMPRESSION: 1. Negative MRA for large or proximal arterial branch occlusion. 2. Severe proximal left SCA stenosis with attenuated flow distally. No other high-grade or correctable stenosis identified. 3. Additional moderate multifocal atheromatous irregularity involving the anterior and posterior circulation as above. Electronically Signed   By: Jeannine Boga M.D.   On: 11/03/2016 01:50   Dg Foot Complete Right  Result Date: 11/02/2016 CLINICAL DATA:  25 y/o  M; stepped on a roofing nail. EXAM: RIGHT FOOT COMPLETE - 3+ VIEW COMPARISON:  None. FINDINGS: No acute fracture or dislocation. Second digit amputation absent phalanges. Lisfranc alignment is maintained. No articular abnormality is evident. IMPRESSION: No acute bony or articular abnormality identified. Electronically Signed   By: Kristine Garbe M.D.   On: 11/02/2016 20:54   Mr Jodene Nam Head/brain HB Cm  Result Date: 11/03/2016 CLINICAL DATA:  Initial evaluation for acute dizziness. EXAM: MRI HEAD WITHOUT CONTRAST MRA HEAD WITHOUT CONTRAST TECHNIQUE: Multiplanar, multiecho pulse sequences of the brain and surrounding structures were obtained without intravenous contrast. Angiographic images of the head were obtained using MRA technique without contrast. COMPARISON:  Prior CT from 11/02/2016. FINDINGS: MRI HEAD FINDINGS Brain: Cerebral volume within normal limits. Few scatter remote lacunar infarcts noted within the left basal ganglia/ corona radiata. Additional  remote lacunar infarct present within the left thalamus. Patchy restricted diffusion within the superior left cerebellar hemisphere, consistent with acute ischemic infarct.  Additional patchy infarct within the left aspect of the midbrain/ pons (series 5, image 20). No associated hemorrhage or mass effect. Gray-white matter differentiation otherwise maintained. No other evidence for acute or subacute ischemia. Few punctate chronic micro hemorrhages noted within the left centrum semi ovale. These may be hypertensive in nature. No mass lesion, midline shift or mass effect. Ventricles normal size without evidence for hydrocephalus. No extra-axial fluid collection. Major dural sinuses are grossly patent. Pituitary gland of normal size. Tiny 3 mm T1 hyperintense lesion within the central aspect of the pituitary gland noted, indeterminate. Midline structures intact and normal. Vascular: Major intracranial vascular flow voids are maintained. Skull and upper cervical spine: Craniocervical junction normal. Visualized upper cervical spine unremarkable. Bone marrow signal intensity within normal limits. No scalp soft tissue abnormality. Sinuses/Orbits: Globes and orbital soft tissues within normal limits. Paranasal sinuses are clear. No mastoid effusion. Inner ear structures normal. Other: None. MRA HEAD FINDINGS ANTERIOR CIRCULATION: Distal cervical segments of the internal carotid arteries are patent with antegrade flow. Petrous segments patent bilaterally without stenosis. Multifocal atheromatous irregularity present throughout the carotid siphons with moderate multifocal irregular narrowing. ICA termini patent. Left A1 segment dominant. Right A1 segment hypoplastic and/ or absent. Atheromatous irregularity present throughout the anterior cerebral arteries without high-grade flow-limiting stenosis. ACA is are patent to their distal aspects. M1 segments patent without high-grade stenosis or occlusion. No proximal M2  occlusion. Distal small vessel atheromatous irregularity throughout the MCA branches bilaterally. POSTERIOR CIRCULATION: Vertebral arteries code dominant and widely patent to the vertebrobasilar junction. Right PICA patent proximally. Left PICA not visualized. Basilar artery widely patent. Right SCA irregular but patent to its distal aspect. Severe stenosis present within the proximal left SCA (series 455). Flow distally is severely attenuated. PCAs arise from the basilar artery. Mild to moderate multifocal atheromatous irregularity involving the PCAs bilaterally without high-grade flow-limiting stenosis. PCAs are patent to their distal aspects. No aneurysm or vascular malformation. IMPRESSION: MRI HEAD IMPRESSION: 1. Patchy multifocal acute ischemic infarcts involving the superior left cerebellar hemisphere and left midbrain/pons. No associated hemorrhage or mass effect. 2. Scattered remote lacunar infarcts involving the bilateral basal ganglia/corona radiata as well as the left thalamus. 3. 3 mm pituitary lesion, indeterminate. Finding could be further assessed with nonemergent pituitary mass protocol MRI. MRA HEAD IMPRESSION: 1. Negative MRA for large or proximal arterial branch occlusion. 2. Severe proximal left SCA stenosis with attenuated flow distally. No other high-grade or correctable stenosis identified. 3. Additional moderate multifocal atheromatous irregularity involving the anterior and posterior circulation as above. Electronically Signed   By: Jeannine Boga M.D.   On: 11/03/2016 01:50    Time Spent in minutes  30   Lala Lund M.D on 11/15/2016 at 9:22 AM  Between 7am to 7pm - Pager - [phone] ( page via [email or web address], text pages only, please mention full 10 digit call back number). After 7pm go to [email or web address] - password [email or web address]

## 2016-11-15 NOTE — Evaluation (Signed)
Physical Therapy Evaluation Patient Details Name: John Jacobson MRN: [id number] DOB: April 22, 1956 Today's Date: 11/15/2016   History of Present Illness  Pt is a 61 yo male presenting to the ER for vertigo. Pt recently (05/25) sustained a L cerebellar stroke and was in inpatient rehab for one week. Pt returned home on 6/1 and began to become symptomatic with dizziness and difficulty walking on 6/3. Upon admission to the ED BP was 220/110 and CT revealed no acute changes.  Clinical Impression  Pt presents with good functional mobility, however balance deficits are present. Pt ambulated 328ft and completed 3 stairs with supervision. Pt requested to carry SPC due to fear of falling, but did not use the cane during gait. Pt presents with good static balance, however pt has decreased stability with quick head turns and direction changes, and is able to correct with static standing and gaze stabilization. Pt continues to have residual deficits from recent stroke including LUE/LLE weakness and L blurred vision. Pt did not report any dizziness during session, but reported a feeling of head pressure. BP was slightly elevated after gait at 186/88 and pulse 78 bpm. Nursing notified of elevated BP. Pt would benefit from continued physical therapy to address weakness and balance deficits. PT recommending outpatient PT. PT will follow acutely to address deficits.     Follow Up Recommendations Outpatient PT;Supervision - Intermittent    Equipment Recommendations  None recommended by PT    Recommendations for Other Services       Precautions / Restrictions Precautions Precautions: Fall Restrictions Weight Bearing Restrictions: No      Mobility  Bed Mobility Overal bed mobility: Modified Independent Bed Mobility: Supine to Sit     Supine to sit: Modified independent (Device/Increase time);HOB elevated     General bed mobility comments: Pt used bed rails for supine to sit. No reports of dizziness  symptoms upon transition.   Transfers Overall transfer level: Needs assistance Equipment used: None Transfers: Sit to/from Stand Sit to Stand: Supervision         General transfer comment: Supervision for safety. Pt reports feel pressure in his head upon standing but no dizziness.  Ambulation/Gait Ambulation/Gait assistance: Supervision Ambulation Distance (Feet): 340 Feet Assistive device: Straight cane Gait Pattern/deviations: Step-through pattern;Decreased stride length Gait velocity: decreased Gait velocity interpretation: Below normal speed for age/gender General Gait Details: Pt carried SPC during gait due to fear of losing balance, but did not require any assistance from the cane. Pt states LLE weakness near end of gait. Pt with minimal instability occuring with head turns and rapid eye movements, but no LOB. BP after gait slightly elevated to 186/88 and pt reporting feeling head pressure. Nurse notified of BP   Stairs Stairs: Yes Stairs assistance: Supervision Stair Management: One rail Right;Step to pattern;Forwards Number of Stairs: 3 General stair comments: Increased time for safety   Wheelchair Mobility    Modified Rankin (Stroke Patients Only) Modified Rankin (Stroke Patients Only) Pre-Morbid Rankin Score: Slight disability Modified Rankin: Moderately severe disability     Balance Overall balance assessment: Needs assistance Sitting-balance support: No upper extremity supported;Feet supported Sitting balance-Leahy Scale: Good     Standing balance support: No upper extremity supported;During functional activity Standing balance-Leahy Scale: Fair Standing balance comment: Pt with good static standing balance. Fair dynamic balance during higher level gait activities             High level balance activites: Direction changes;Head turns High Level Balance Comments: Pt reports feeling off balance  with head turns and direction changes. Pt able to self  correct by focusing and maintain static balance.              Pertinent Vitals/Pain Pain Assessment: No/denies pain    Home Living Family/patient expects to be discharged to:: Private residence Living Arrangements: Other relatives Available Help at Discharge: Family Type of Home: House Home Access: Level entry (Has 1 step on back porch)     Home Layout: One level Home Equipment: Cane - single point Additional Comments: Pt has SPC, but only uses it intermittently     Prior Function Level of Independence: Independent         Comments: Recently d/c home from inpatient rehab. Uses a SPC as needed for stability, but reports he usually just carries it around.     Hand Dominance   Dominant Hand: Right    Extremity/Trunk Assessment   Upper Extremity Assessment Upper Extremity Assessment: Generalized weakness (Residual LUE deficits from recent stroke)    Lower Extremity Assessment Lower Extremity Assessment: Generalized weakness (Residual LLE weakness from recent cerebellar stroke)       Communication   Communication: No difficulties  Cognition Arousal/Alertness: Awake/alert Behavior During Therapy: WFL for tasks assessed/performed Overall Cognitive Status: Within Functional Limits for tasks assessed                                        General Comments General comments (skin integrity, edema, etc.): Pt reports he was scheduled for outpatient PT and OT this week to address residual stroke weakness.    Exercises     Assessment/Plan    PT Assessment Patient needs continued PT services  PT Problem List Decreased strength;Decreased activity tolerance;Decreased balance;Decreased mobility;Decreased coordination;Decreased knowledge of use of DME       PT Treatment Interventions DME instruction;Gait training;Stair training;Functional mobility training;Therapeutic activities;Therapeutic exercise;Balance training;Neuromuscular  re-education;Patient/family education    PT Goals (Current goals can be found in the Care Plan section)  Acute Rehab PT Goals Patient Stated Goal: to go home PT Goal Formulation: With patient Time For Goal Achievement: 11/15/16 Potential to Achieve Goals: Good    Frequency Min 3X/week   Barriers to discharge        Co-evaluation               AM-PAC PT "6 Clicks" Daily Activity  Outcome Measure Difficulty turning over in bed (including adjusting bedclothes, sheets and blankets)?: None Difficulty moving from lying on back to sitting on the side of the bed? : A Little Difficulty sitting down on and standing up from a chair with arms (e.g., wheelchair, bedside commode, etc,.)?: A Little Help needed moving to and from a bed to chair (including a wheelchair)?: A Little Help needed walking in hospital room?: A Little Help needed climbing 3-5 steps with a railing? : A Little 6 Click Score: 19    End of Session Equipment Utilized During Treatment: Gait belt Activity Tolerance: Patient tolerated treatment well Patient left: in chair;with call bell/phone within reach Nurse Communication: Mobility status;Other (comment) (Elevated BP) PT Visit Diagnosis: Unsteadiness on feet (R26.81);Other abnormalities of gait and mobility (R26.89)    Time: 6578-4696 PT Time Calculation (min) (ACUTE ONLY): 24 min   Charges:   PT Evaluation $PT Eval Moderate Complexity: 1 Procedure PT Treatments $Gait Training: 8-22 mins   PT G Codes:   PT G-Codes **NOT FOR INPATIENT CLASS** Functional  Assessment Tool Used: Clinical judgement Functional Limitation: Mobility: Walking and moving around Mobility: Walking and Moving Around Current Status [phone]): At least 20 percent but less than 40 percent impaired, limited or restricted Mobility: Walking and Moving Around Goal Status [phone]): At least 1 percent but less than 20 percent impaired, limited or restricted    Loma Sousa,  SPT  [phone]  Loma Sousa 11/15/2016, 9:20 AM

## 2016-11-15 NOTE — Care Management Note (Signed)
Case Management Note  Patient Details  Name: John Jacobson MRN: [id number] Date of Birth: 1955-08-31  Subjective/Objective:  Pt in to r/o CVA. He is from home with relatives.                   Action/Plan: PT/OT recommending outpatient therapy. CM following for d/c needs, physician orders.   Expected Discharge Date:                  Expected Discharge Plan:  Home/Self Care  In-House Referral:     Discharge planning Services  CM Consult  Post Acute Care Choice:    Choice offered to:     DME Arranged:    DME Agency:     HH Arranged:    HH Agency:     Status of Service:  In process, will continue to follow  If discussed at Long Length of Stay Meetings, dates discussed:    Additional Comments:  Pollie Friar, RN 11/15/2016, 11:23 AM

## 2016-11-15 NOTE — Progress Notes (Signed)
Occupational Therapy Evaluation Patient Details Name: John Jacobson MRN: [id number] DOB: 1956-01-13 Today's Date: 11/15/2016    History of Present Illness Pt is a 60 yo male presenting to the ER for vertigo. Pt recently (05/25) sustained a L cerebellar stroke and was in inpatient rehab for one week. Pt returned home on 6/1 and began to become symptomatic with dizziness and difficulty walking on 6/3. Upon admission to the ED BP was 220/110 and CT revealed no acute changes.   Clinical Impression   PTA, pt living at home with brother. Pt scheduled to follow up with outpt OT after DC from CIR last week. Began education on compensatory strategies for low vision, education on fine motor/coordination and gaze stabilization activities. Will continue to follow acutely to address established goals to facilitate safe DC home.Pt will need to follow up with OT at the neuro outpt center.  BP 189/103 sitting.    Follow Up Recommendations  Outpatient OT;Supervision - Intermittent    Equipment Recommendations  None recommended by OT    Recommendations for Other Services       Precautions / Restrictions Precautions Precautions: Fall Precaution Comments:  (Monitor BP) Restrictions Weight Bearing Restrictions: No      Mobility Bed Mobility OOB in chair  Transfers Overall transfer level: Needs assistance Equipment used: None Transfers: Sit to/from Stand Sit to Stand: Supervision         General transfer comment: Supervision for safety. Pt reports feel pressure in his head upon standing but no dizziness.    Balance Overall balance assessment: Needs assistance Sitting-balance support: No upper extremity supported;Feet supported Sitting balance-Leahy Scale: Good                   ADL either performed or assessed with clinical judgement   ADL Overall ADL's : Needs assistance/impaired Eating/Feeding: Independent;Sitting   Grooming: Supervision/safety;Standing;Wash/dry hands;Oral  care   Upper Body Bathing: Set up;Sitting   Lower Body Bathing: Set up;Supervison/ safety;Sit to/from stand   Upper Body Dressing : Set up;Sitting   Lower Body Dressing: Set up;Sitting/lateral leans   Toilet Transfer: Ambulation;Supervision/safety   Toileting- Clothing Manipulation and Hygiene: Sit to/from stand;Modified independent       Functional mobility during ADLs: Supervision/safety General ADL Comments: Educated pt on compensatory techniques to avoid positions which provoke "dizziness". Educatead on neeed to sit when dressing/bathing adn to bring feet up to him instead of bending over. Pt issued reacher to assist with ADL and retrieving items from floor to reduce risk of falls.ing. Pt verbalized understanding.      Vision Baseline Vision/History: Wears glasses;Retinopathy Wears Glasses: Reading only Patient Visual Report: Blurring of vision (L eye; new with CVA) Vision Assessment?: Vision impaired- to be further tested in functional context Additional Comments: L eye "blurred vision"     Perception     Praxis Praxis Praxis tested?: Within functional limits    Pertinent Vitals/Pain Pain Assessment: Faces Faces Pain Scale: Hurts a little bit Pain Location: neck/head Pain Descriptors / Indicators: Aching;Dull Pain Intervention(s): Limited activity within patient's tolerance     Hand Dominance Right   Extremity/Trunk Assessment Upper Extremity Assessment Upper Extremity Assessment: LUE deficits/detail LUE Deficits / Details: Difficulty with fine motor/coordination; difficulty with typing LUE Sensation: decreased light touch (ulnar side of hand x 2 months) LUE Coordination: decreased fine motor   Lower Extremity Assessment Lower Extremity Assessment: Defer to PT evaluation RLE Coordination: decreased gross motor LLE Coordination: decreased gross motor   Cervical / Trunk Assessment Cervical /  Trunk Assessment: Normal   Communication  Communication Communication: No difficulties   Cognition Arousal/Alertness: Awake/alert Behavior During Therapy: WFL for tasks assessed/performed Overall Cognitive Status: Within Functional Limits for tasks assessed                                     General Comments  Pt reports he was scheduled for outpatient PT and OT this week to address residual stroke weakness.    Exercises Exercises: Other exercises Other Exercises Other Exercises: L UE fine motor/coordination Other Exercises: visual/gaze stabilization activites Other Exercises: compensation strategies for low visioni   Shoulder Instructions      Home Living Family/patient expects to be discharged to:: Private residence Living Arrangements: Other relatives Available Help at Discharge: Family Type of Home: House Home Access: Level entry (Has 1 step on back porch)     Home Layout: One level     Bathroom Shower/Tub: Teacher, early years/pre: Standard Bathroom Accessibility: Yes How Accessible: Accessible via walker Home Equipment: Cane - single point;Shower seat   Additional Comments: Pt has SPC, but only uses it intermittently   Lives With: Family    Prior Functioning/Environment Level of Independence: Independent        Comments: Recently d/c home from inpatient rehab. Uses a SPC as needed for stability, but reports he usually just carries it around.        OT Problem List: Impaired balance (sitting and/or standing);Decreased coordination;Decreased knowledge of use of DME or AE;Impaired UE functional use;Decreased strength;Impaired vision/perception;Impaired sensation      OT Treatment/Interventions: Self-care/ADL training;DME and/or AE instruction;Therapeutic activities;Patient/family education;Balance training;Neuromuscular education;Therapeutic exercise;Visual/perceptual remediation/compensation    OT Goals(Current goals can be found in the care plan section) Acute Rehab OT  Goals Patient Stated Goal: to go back to work OT Goal Formulation: With patient Time For Goal Achievement: 11/29/16 Potential to Achieve Goals: Good ADL Goals Pt/caregiver will Perform Home Exercise Program: Left upper extremity;With written HEP provided (fine motor/coordination) Additional ADL Goal #1: Pt will independently verbalize 3 strategies to reduce risk of falls Additional ADL Goal #2: Pt will demonstrate independence with gaze stabilization activities to increase functional vision for ADL tasks  OT Frequency: Min 3X/week   Barriers to D/C: Decreased caregiver support          Co-evaluation              AM-PAC PT "6 Clicks" Daily Activity     Outcome Measure Help from another person eating meals?: None Help from another person taking care of personal grooming?: None Help from another person toileting, which includes using toliet, bedpan, or urinal?: None Help from another person bathing (including washing, rinsing, drying)?: None Help from another person to put on and taking off regular upper body clothing?: None Help from another person to put on and taking off regular lower body clothing?: None 6 Click Score: 24   End of Session    Activity Tolerance: Patient tolerated treatment well Patient left: in chair;with call bell/phone within reach;with chair alarm set  OT Visit Diagnosis: Unsteadiness on feet (R26.81);Muscle weakness (generalized) (M62.81)                Time: 9518-8416 OT Time Calculation (min): 43 min Charges:  OT General Charges $OT Visit: 1 Procedure OT Evaluation $OT Eval Moderate Complexity: 1 Procedure OT Treatments $Therapeutic Activity: 8-22 mins $Therapeutic Exercise: 8-22 mins G-Codes: OT G-codes **NOT FOR  INPATIENT CLASS** Functional Assessment Tool Used: Clinical judgement Functional Limitation: Self care Self Care Current Status (Z1820): At least 1 percent but less than 20 percent impaired, limited or restricted Self Care Goal  Status (V9068): At least 1 percent but less than 20 percent impaired, limited or restricted   [facility], OT/L  [phone] 11/15/2016  Doctor Sheahan,HILLARY 11/15/2016, 11:12 AM

## 2016-11-15 NOTE — ED Notes (Signed)
Patient transported to MRI 

## 2016-11-15 NOTE — Consult Note (Signed)
Neurology Consultation Reason for Consult: Increased dizziness Referring Physician: Pollina, C  CC: Increased dizziness  History is obtained from: Dizziness  HPI: John Jacobson is a 61 y.o. male recently admitted from 5/24 until 5/25. He was doing well following discharge, having some ataxia but overall doing well. Over the past 2 days, he has noticed dramatic increase in his vertigo and difficulty accomplishing his activities.  Workup included echo without embolic source, carotid Dopplers with no stenosis, LDL of 94, A1c of 8.6. He was discharged on aspirin and Plavix and statin was started.   LKW: Yesterday tpa given?: no, outside of window   ROS: A 14 point ROS was performed and is negative except as noted in the HPI.   Past Medical History:  Diagnosis Date  . Asthma   . DM (diabetes mellitus) ([place])   . HTN (hypertension)   . RBBB (right bundle branch block)      Family History  Problem Relation Age of Onset  . Coronary artery disease Mother        MI in her 21s  . Hypertension Unknown   . Diabetes Unknown   . Alzheimer's disease Unknown      Social History:  reports that he has been smoking.  He has never used smokeless tobacco. He reports that he drinks alcohol. He reports that he does not use drugs.   Exam: Current vital signs: BP (!) 199/94   Pulse (!) 55   Temp 98 F (36.7 C) (Oral)   Resp 18   SpO2 100%  Vital signs in last 24 hours: Temp:  [97.7 F (36.5 C)-98 F (36.7 C)] 98 F (36.7 C) (06/04 2257) Pulse Rate:  [44-63] 55 (06/05 0015) Resp:  [17-20] 18 (06/05 0015) BP: (165-226)/(79-98) 199/94 (06/05 0015) SpO2:  [96 %-100 %] 100 % (06/05 0015)   Physical Exam  Constitutional: Appears well-developed and well-nourished.  Psych: Affect appropriate to situation Eyes: No scleral injection HENT: No OP obstrucion Head: Normocephalic.  Cardiovascular: Normal rate and regular rhythm.  Respiratory: Effort normal and breath sounds normal to  anterior ascultation GI: Soft.  No distension. There is no tenderness.  Skin: WDI  Neuro: Mental Status: Patient is awake, alert, oriented to person, place, month, year, and situation. Patient is able to give a clear and coherent history. No signs of aphasia or neglect Cranial Nerves: II: Visual Fields are full. Pupils are equal, round, and reactive to light.   III,IV, VI: EOMI without ptosis or diploplia.  V: Facial sensation is symmetric to temperature VII: Facial movement is symmetric.  VIII: hearing is intact to voice X: Uvula elevates symmetrically XI: Shoulder shrug is symmetric. XII: tongue is midline without atrophy or fasciculations.  Motor: Tone is normal. Bulk is normal. 5/5 strength was present in bilateral arms, seemingly good strength in his legs but he does not give good effort on either side. Sensory: Sensation is symmetric to light touch and temperature in the arms and legs. Deep Tendon Reflexes: 2+ and symmetric in the biceps and patellae.  Plantars: Toes are downgoing bilaterally.  Cerebellar: FNF and HKS are intact on the right, both are slightly ataxic on the left.   I have reviewed labs in epic and the results pertinent to this consultation are: Elevated creatinine  I have reviewed the images obtained: CT head-no acute pathology  Impression: 61 year old male with recurrent vertigo/dizziness in the setting of severe SCA stenosis. He unfortunately does have some renal insufficiency and therefore I will hold off  on vascular imaging. He will need to continue dual antiplatelet therapy and may need reevaluation by physical therapy.  One other possibility would be that this is due to hypertensive urgency, but the current time I would favor letting his blood pressure ride high rather than trying to control it.  Recommendations: 1) permissive hypertension to 220/120 2) repeat MRI of the brain 3) PT/OT 4) stroke team to follow   Roland Rack, MD [facility] [phone]  If 7pm- 7am, please page neurology on call as listed in [place].

## 2016-11-15 NOTE — H&P (Signed)
History and Physical  Patient Name: John Jacobson     PPI:[id number]    DOB: 1955/08/18    DOA: 11/14/2016 PCP: Patient, No Pcp Per   Patient coming from: Home     Chief Complaint: Dizziness/vertigo  HPI: John Jacobson is a 61 y.o. male with a past medical history significant for IDDM, HTN, CKD III baseline Cr 2.2, hep C, HFpEF and recent stroke who presents with vertigo.  The patient was recently admitted 5/23-5/25 for new L cerebellar stroke, was in inpatient rehab one week, and home last Friday on new aspirin 325 + Plavix.  Had an MRA during that hospitalization that showed severe L SCA stenosis.  Was also discharged back on clonidine, doxazosin, hydralazine, furosemide and losartan and discharge BP from inpatient rehab was 063 systolic.  Over the weekend the patient was fine until Sunday at the grocery store, he noticed vertigo with standing up after leaning over to get something from a low shelf.  He describes this as "moving around in my head" or spinning sensation.  Then today, this was more intense, more prolonged when he moved his head from sitting to standing.  Finally, he had a particularly intense episode going to the bathroom, after which he had "difficulty walking back to the couch" even with his cane, so he came to the ER.  ED course: -Afebrile, heart rate 56, respirations pulse ox normal, blood pressure 220/110 -Na 135, K 3.9, Cr 2.2 (baseline 2.0-2.3), WBC 10.6K, Hgb 10.6 -Coags normal -Troponin negative CT head showed no acute findings -ECG showed sinus bradycardia, old RBBB -He was evaluated by neurology, who recommended MRI brain and observation    Review of systems:  Review of Systems  Eyes: Positive for blurred vision (left eye, stable). Negative for double vision.  Neurological: Positive for dizziness (vertigo worse with movement). Negative for tingling, tremors, sensory change, speech change, focal weakness, seizures, loss of consciousness and headaches.  All  other systems reviewed and are negative.        Past Medical History:  Diagnosis Date  . Asthma   . DM (diabetes mellitus) ([place])   . HTN (hypertension)   . RBBB (right bundle branch block)     Past Surgical History:  Procedure Laterality Date  . BACK SURGERY    . HAND SURGERY      Social History: Patient lives with his brothe.  Patient walks with a cane.  Former smoker, remote.  Does not drink alcohol regularly.  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  From [place] originally.  Allergies  Allergen Reactions  . Amlodipine Besy-Benazepril Hcl Shortness Of Breath and Swelling    Mouth and tongue swelling  . Shellfish Allergy Anaphylaxis    Family history: family history includes Coronary artery disease in his brother and mother.  Prior to Admission medications   Medication Sig Start Date End Date Taking? Authorizing Provider  aspirin EC 325 MG EC tablet Take 1 tablet (325 mg total) by mouth daily. 11/05/16  Yes Reyne Dumas, MD  atorvastatin (LIPITOR) 20 MG tablet Take 1 tablet (20 mg total) by mouth daily at 6 PM. 11/11/16  Yes Angiulli, Lavon Paganini, PA-C  Carboxymethylcellulose Sod PF 0.25 % SOLN Apply 1 drop to eye 4 (four) times daily.   Yes [provider]  cholecalciferol (VITAMIN D) 1000 units tablet Take 2 tablets (2,000 Units total) by mouth daily. 11/11/16  Yes Angiulli, Lavon Paganini, PA-C  cloNIDine (CATAPRES) 0.3 MG  tablet Take 1 tablet (0.3 mg total) by mouth 3 (three) times daily. 11/11/16  Yes Angiulli, Lavon Paganini, PA-C  clopidogrel (PLAVIX) 75 MG tablet Take 1 tablet (75 mg total) by mouth daily. 11/11/16  Yes Angiulli, Lavon Paganini, PA-C  doxazosin (CARDURA) 4 MG tablet Take 0.5 tablets (2 mg total) by mouth 2 (two) times daily. 11/11/16  Yes Angiulli, Lavon Paganini, PA-C  Elbasvir-Grazoprevir 50-100 MG TABS Take 1 tablet by mouth daily.   Yes [provider]  furosemide (LASIX) 40 MG tablet Take 1 tablet (40 mg total) by mouth daily.  11/11/16  Yes Angiulli, Lavon Paganini, PA-C  glipiZIDE (GLUCOTROL) 10 MG tablet Take 1 tablet (10 mg total) by mouth daily before breakfast. 11/11/16  Yes Angiulli, Lavon Paganini, PA-C  glucose 4 GM chewable tablet Chew 1 tablet by mouth daily as needed for low blood sugar.   Yes [provider]  hydrALAZINE (APRESOLINE) 100 MG tablet Take 1 tablet (100 mg total) by mouth 3 (three) times daily. 11/11/16  Yes Angiulli, Lavon Paganini, PA-C  insulin glargine (LANTUS) 100 unit/mL SOPN Inject 0.1 mLs (10 Units total) into the skin at bedtime. 11/11/16  Yes Angiulli, Lavon Paganini, PA-C  losartan (COZAAR) 100 MG tablet Take 1 tablet (100 mg total) by mouth daily. 11/11/16  Yes Angiulli, Lavon Paganini, PA-C  oxyCODONE (OXY IR/ROXICODONE) 5 MG immediate release tablet Take 1-2 tablets (5-10 mg total) by mouth every 4 (four) hours as needed for moderate pain. 11/11/16  Yes Angiulli, Lavon Paganini, PA-C  polyethylene glycol (MIRALAX / GLYCOLAX) packet Take 17 g by mouth daily. Patient taking differently: Take 17 g by mouth daily as needed for mild constipation.  04/03/16  Yes Bonnielee Haff, MD  saccharomyces boulardii (FLORASTOR) 250 MG capsule Take 1 capsule (250 mg total) by mouth 2 (two) times daily. 11/11/16  Yes Angiulli, Lavon Paganini, PA-C  senna (SENOKOT) 8.6 MG TABS tablet Take 1 tablet (8.6 mg total) by mouth daily. Patient taking differently: Take 1 tablet by mouth daily as needed for mild constipation.  04/03/16  Yes Bonnielee Haff, MD  Skin Protectants, Misc. (EUCERIN) cream Apply 1 application topically daily. On feet   Yes [provider]     Physical Exam: BP (!) 178/84 (BP Location: Left Arm)   Pulse 67   Temp 97.7 F (36.5 C) (Oral)   Resp 15   Ht 5\' 7"  (1.702 m)   Wt 76.4 kg (168 lb 6.9 oz)   SpO2 97%   BMI 26.38 kg/m  General appearance: Well-developed, adult male, alert and in no acute distress.   Eyes: Anicteric, conjunctiva pink, lids and lashes normal. PERRL.    ENT: No nasal deformity, discharge,  epistaxis.  Hearing normal. OP moist without lesions.   Dentition normal, decay in right front tooth. Lymph: No cervical, supraclavicular or axillary lymphadenopathy. Skin: Warm and dry.  No suspicious rashes or lesions. Cardiac: RRR, nl S1-S2, no murmurs appreciated.  Capillary refill is brisk.  JVP normal.  No LE edema.  Radial pulses 2+ and symmetric.  Left carotid bruit. Respiratory: Normal respiratory rate and rhythm.  CTAB without rales or wheezes. GI: Abdomen soft without rigidity.  No TTP. No ascites, distension, no hepatosplenomegaly.   MSK: No deformities or effusions. Neuro: Pupils are 4 mm and reactive to 3 mm. Extraocular movements are intact, without nystagmus. Cranial nerve 5 is within normal limits. Cranial nerve 7 is symmetrical. Cranial nerve 8 is within normal limits. Cranial nerves 9 and 10 reveal equal palate  elevation. Cranial nerve 11 reveals sternocleidomastoid strong. Cranial nerve 12 is midline. I do not note a deficit in motor strength testing in the upper and lower extremities bilaterally with normal motor, tone and bulk. Finger-to-nose testing is stably dysmetric on left. Speech is fluent. Naming is grossly intact. Attention span and concentration are within normal limits.   Psych: The patient is oriented to time, place and person. Behavior appropriate.  Affect normal.  Recall, recent and remote, as well as general fund of knowledge seem within normal limits. No evidence of aural or visual hallucinations or delusions.       Labs on Admission:  I have personally reviewed following labs and imaging studies: CBC:  Recent Labs Lab 11/09/16 0500 11/14/16 1852 11/14/16 1915  WBC 9.4 10.6*  --   NEUTROABS 5.5 5.6  --   HGB 10.7* 10.6* 10.9*  HCT 32.8* 31.8* 32.0*  MCV 90.4 89.3  --   PLT 224 342  --    Basic Metabolic Panel:  Recent Labs Lab 11/09/16 0500 11/10/16 1055 11/11/16 0848 11/14/16 1852 11/14/16 1915  NA 137 139 134* 135 138  K 4.1 4.3  4.1 3.9 4.1  CL 104 105 101 100* 98*  CO2 26 25 24 26   --   GLUCOSE 154* 98 204* 195* 198*  BUN 35* 37* 33* 32* 39*  CREATININE 2.03* 2.31* 2.23* 2.24* 2.30*  CALCIUM 8.9 9.4 9.4 9.1  --   PHOS 3.9  --  3.8  --   --    GFR: Estimated Creatinine Clearance: 31.1 mL/min (A) (by C-G formula based on SCr of 2.3 mg/dL (H)). Liver Function Tests:  Recent Labs Lab 11/09/16 0500 11/11/16 0848 11/14/16 1852  AST  --   --  22  ALT  --   --  25  ALKPHOS  --   --  88  BILITOT  --   --  0.7  PROT  --   --  7.2  ALBUMIN 2.8* 3.5 3.3*   No results for input(s): LIPASE, AMYLASE in the last 168 hours. No results for input(s): AMMONIA in the last 168 hours. Coagulation Profile:  Recent Labs Lab 11/14/16 1852  INR 1.13   Cardiac Enzymes: No results for input(s): CKTOTAL, CKMB, CKMBINDEX, TROPONINI in the last 168 hours. BNP (last 3 results) No results for input(s): PROBNP in the last 8760 hours. HbA1C: No results for input(s): HGBA1C in the last 72 hours. CBG:  Recent Labs Lab 11/10/16 0651 11/10/16 1119 11/10/16 1651 11/10/16 2110 11/11/16 0658  GLUCAP 230* 90 197* 258* 175*   Lipid Profile: No results for input(s): CHOL, HDL, LDLCALC, TRIG, CHOLHDL, LDLDIRECT in the last 72 hours. Thyroid Function Tests: No results for input(s): TSH, T4TOTAL, FREET4, T3FREE, THYROIDAB in the last 72 hours. Anemia Panel: No results for input(s): VITAMINB12, FOLATE, FERRITIN, TIBC, IRON, RETICCTPCT in the last 72 hours. Sepsis Labs:  Invalid input(s): PROCALCITONIN, LACTICIDVEN No results found for this or any previous visit (from the past 240 hour(s)).    Radiological Exams on Admission: Personally reviewed CT head and MR brain: Ct Head Wo Contrast  Result Date: 11/14/2016 CLINICAL DATA:  Dizziness, recent stroke EXAM: CT HEAD WITHOUT CONTRAST TECHNIQUE: Contiguous axial images were obtained from the base of the skull through the vertex without intravenous contrast. COMPARISON:  MR  brain 11/03/2016 FINDINGS: Brain: No evidence of acute infarction, hemorrhage, extra-axial collection, ventriculomegaly, or mass effect. Subacute left cerebellar and left pontine infarct. Generalized cerebral atrophy. Periventricular white matter low attenuation  likely secondary to microangiopathy. Vascular: Cerebrovascular atherosclerotic calcifications are noted. Skull: Negative for fracture or focal lesion. Sinuses/Orbits: Visualized portions of the orbits are unremarkable. Visualized portions of the paranasal sinuses and mastoid air cells are unremarkable. Other: None. IMPRESSION: 1. No acute intracranial pathology. 2. Subacute left pontine and left cerebellar infarcts. Electronically Signed   By: Kathreen Devoid   On: 11/14/2016 21:40   Mr Brain Wo Contrast  Result Date: 11/15/2016 CLINICAL DATA:  Initial evaluation for acute dizziness. EXAM: MRI HEAD WITHOUT CONTRAST TECHNIQUE: Multiplanar, multiecho pulse sequences of the brain and surrounding structures were obtained without intravenous contrast. COMPARISON:  Prior CT from 11/14/2016 as well as recent MRI from 11/03/2016. FINDINGS: Brain: Stable atrophy with chronic small vessel ischemic disease. Scatter remote lacunar infarct present within the bilateral basal ganglia/ corona radiata. Remote lacunar infarct present at the left thalamus. Normal expected interval evolution seen about the recently identified left cerebellar and left pontine infarcts. Small amount of associated petechial hemorrhage at the left cerebellar infarcts (series 8, image 36). No significant mass effect. No other abnormal foci of restricted diffusion to suggest acute ischemic infarct. Gray-white matter differentiation maintained. Additional small subcentimeter chronic microhemorrhage noted within the left periventricular white matter (series 8, image 71). No evidence for acute intracranial hemorrhage. No mass lesion, midline shift or mass effect. No hydrocephalus. No extra-axial fluid  collection. Major dural sinuses are grossly patent. Tiny 3 mm T1 hyperintense lesion again noted within the posterior pituitary gland, indeterminate. Suprasellar region unremarkable. Midline structures intact and normal. Vascular: Major intracranial vascular flow voids are maintained. Skull and upper cervical spine: Craniocervical junction within normal limits. Multilevel degenerative spondylolysis noted within the upper cervical spine without significant stenosis. Bone marrow signal intensity within normal limits. No scalp soft tissue abnormality. Sinuses/Orbits: Globes and orbital soft tissues within normal limits. Paranasal sinuses are grossly clear. No mastoid effusion. Inner ear structures normal. Other: None. IMPRESSION: 1. Normal expected interval evolution of patchy left pontine and left cerebellar infarcts. 2. Otherwise stable appearance of the brain. Stable chronic microvascular ischemic disease. No other acute intracranial infarct or other process identified. 3. 3 mm pituitary lesion, indeterminate. Again, this could be further assessed with dedicated nonemergent pituitary mass protocol MRI as desired. Electronically Signed   By: Jeannine Boga M.D.   On: 11/15/2016 02:03     EKG: Independently reviewed. Rate 52, RBBB unchanged.  No St changes.      Assessment/Plan  1. Ataxia, vertigo:  Progressive since discharge. MRI shows no extension of stroke.   -Admit to telemetry -Neuro checks, NIHSS per protocol -Continue daily aspirin 325 mg and Plavix -Permissive hypertension for now -PT/OT consultation -Consult to Neurology, appreciate recommendations -Family are concerned that patient unable to care for self at home, may benefit from SNF rehab, so SW and CM are consulted   2. Anemia:  Stable  3. Hypertensive urgency:  -Permissive hypertension for now, hold doxazosin, clonidine, furosemide, losartan -Hydralazine PO PRN for BP >220/110  4. Chronic kidney disease stage III:    Stable at baseline Cr 2.0  5. Insulin-dependent diabetes:  Hyperglycemic at admission -Continue home glargine -SSI with meals -Hold glipizide  6. Hepatitis C:  Stable.  -Continue Zepatier      DVT prophylaxis: Lovenox  Code Status: FULL  Family Communication: Daughter at bedsdie  Disposition Plan: Anticipate Stroke work up as above and consult to ancillary services.  Expect discharge within 1-2 days. Consults called: Neurology, Dr. Leonel Ramsay has seen patient. Admission status: Telemetry, OBS  status  Core measures: -VTE prophylaxis ordered at time of admission -Aspirin ordered at admission -Atrial fibrillation: not present -tPA not given because of outside window -Dysphagia screen ordered in ER -Lipids ordered last month -PT eval ordered -Non-smoker    Medical decision making: Patient seen at 3:00 AM on 11/15/2016.  The patient was discussed with Dr. Leonel Ramsay, [facility]. What exists of the patient's chart was reviewed in depth and summarized above.  Clinical condition: stable.       Edwin Dada [facility] Pager [phone]

## 2016-11-15 NOTE — ED Provider Notes (Signed)
[facility] DEPT Provider Note   CSN: [id number] Arrival date & time: 11/14/16  1816     History   Chief Complaint Chief Complaint  Patient presents with  . Dizziness    HPI John Jacobson is a 61 y.o. male.  The history is provided by the patient and medical records. No language interpreter was used.  Dizziness   John Jacobson is a 61 y.o. 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 Patient states he was discharged from rehab facility on Friday June 1st and felt pretty good at that time. He was able to ambulate with steady gait and felt comfortable using cane to get around. This morning, he awoke and felt like he "could never get my balance right". He felt dizzy as well like "something spinning around in my head". He notes dizziness is worse with bending over or getting up from seated position. He also feels like symptoms are worse when trying to read for a long period of time. He also endorses blurred vision to the left eye since onset of symptoms back in May. No changes in speech or weakness to extremities. He missed his lunch and dinner dose of blood pressure medication. No meds prior to arrival for symptoms.   Past Medical History:  Diagnosis Date  . Asthma   . DM (diabetes mellitus) ([place])   . HTN (hypertension)   . RBBB (right bundle branch block)     Patient Active Problem List   Diagnosis Date Noted  . Stroke ([place]) 11/15/2016  . Labile blood glucose   . Hepatitis C virus infection without hepatic coma   . Resistant hypertension   . AKI (acute kidney injury) ([place])   . Brainstem infarct, acute ([place])   . Benign essential HTN   . Chronic diastolic heart failure ([place])   . Stage 3 chronic kidney disease   . Hypokalemia   . Leukocytosis   . Acute blood loss anemia   . 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 Cerebellar infarction ([place]) 11/04/2016  . Type 2 diabetes mellitus with peripheral neuropathy ([facility])   . Hyperlipidemia   . Gait disturbance, post-stroke   . CKD (chronic kidney disease), stage III 11/02/2016  . Normocytic anemia 11/02/2016  . Ataxia 11/02/2016  . Ischemic stroke ([place]) 11/02/2016  . Puncture wound of right foot 11/02/2016  . Chronic diastolic CHF (congestive heart failure) ([place]) 11/02/2016  . Chronic left-sided low back pain 11/02/2016  . Demand ischemia ([place])   . ARF (acute renal failure) ([place])   . Hypertensive urgency 03/29/2016  . Abnormal electrocardiogram 05/24/2011  . Chest pain 05/24/2011  . Hypertension 05/24/2011  . Tobacco abuse 05/24/2011  . DM (diabetes mellitus), type 2 with renal complications ([place]) 69/67/8938    Past Surgical History:  Procedure Laterality Date  . BACK SURGERY    . HAND SURGERY         Home Medications    Prior to Admission medications   Medication Sig Start Date End Date Taking? Authorizing Provider  aspirin EC 325 MG EC tablet Take 1 tablet (325 mg total) by mouth daily. 11/05/16  Yes Reyne Dumas, MD  atorvastatin (LIPITOR) 20 MG tablet Take 1 tablet (20 mg total) by mouth daily at 6 PM. 11/11/16  Yes Angiulli, Lavon Paganini, PA-C  Carboxymethylcellulose Sod PF  0.25 % SOLN Apply 1 drop to eye 4 (four) times daily.   Yes [provider]  cholecalciferol (VITAMIN D) 1000 units tablet Take 2 tablets (2,000 Units total) by mouth daily. 11/11/16  Yes Angiulli, Lavon Paganini, PA-C  cloNIDine (CATAPRES) 0.3 MG tablet Take 1 tablet (0.3 mg total) by mouth 3 (three) times daily. 11/11/16  Yes Angiulli, Lavon Paganini, PA-C  clopidogrel (PLAVIX) 75 MG tablet Take 1 tablet (75 mg total) by mouth daily. 11/11/16  Yes Angiulli, Lavon Paganini, PA-C  doxazosin (CARDURA) 4 MG tablet Take 0.5 tablets (2 mg total) by mouth 2 (two) times daily. 11/11/16  Yes Angiulli, Lavon Paganini, PA-C  Elbasvir-Grazoprevir 50-100 MG TABS Take 1 tablet by  mouth daily.   Yes [provider]  furosemide (LASIX) 40 MG tablet Take 1 tablet (40 mg total) by mouth daily. 11/11/16  Yes Angiulli, Lavon Paganini, PA-C  glipiZIDE (GLUCOTROL) 10 MG tablet Take 1 tablet (10 mg total) by mouth daily before breakfast. 11/11/16  Yes Angiulli, Lavon Paganini, PA-C  glucose 4 GM chewable tablet Chew 1 tablet by mouth daily as needed for low blood sugar.   Yes [provider]  hydrALAZINE (APRESOLINE) 100 MG tablet Take 1 tablet (100 mg total) by mouth 3 (three) times daily. 11/11/16  Yes Angiulli, Lavon Paganini, PA-C  insulin glargine (LANTUS) 100 unit/mL SOPN Inject 0.1 mLs (10 Units total) into the skin at bedtime. 11/11/16  Yes Angiulli, Lavon Paganini, PA-C  losartan (COZAAR) 100 MG tablet Take 1 tablet (100 mg total) by mouth daily. 11/11/16  Yes Angiulli, Lavon Paganini, PA-C  oxyCODONE (OXY IR/ROXICODONE) 5 MG immediate release tablet Take 1-2 tablets (5-10 mg total) by mouth every 4 (four) hours as needed for moderate pain. 11/11/16  Yes Angiulli, Lavon Paganini, PA-C  polyethylene glycol (MIRALAX / GLYCOLAX) packet Take 17 g by mouth daily. Patient taking differently: Take 17 g by mouth daily as needed for mild constipation.  04/03/16  Yes Bonnielee Haff, MD  saccharomyces boulardii (FLORASTOR) 250 MG capsule Take 1 capsule (250 mg total) by mouth 2 (two) times daily. 11/11/16  Yes Angiulli, Lavon Paganini, PA-C  senna (SENOKOT) 8.6 MG TABS tablet Take 1 tablet (8.6 mg total) by mouth daily. Patient taking differently: Take 1 tablet by mouth daily as needed for mild constipation.  04/03/16  Yes Bonnielee Haff, MD  Skin Protectants, Misc. (EUCERIN) cream Apply 1 application topically daily. On feet   Yes [provider]    Family History Family History  Problem Relation Age of Onset  . Coronary artery disease Mother        MI in her 39s  . Hypertension Unknown   . Diabetes Unknown   . Alzheimer's disease Unknown     Social History Social History  Substance Use Topics  .  Smoking status: Current Every Day Smoker  . Smokeless tobacco: Never Used  . Alcohol use Yes     Comment: Few beers every other day     Allergies   Amlodipine besy-benazepril hcl and Shellfish allergy   Review of Systems Review of Systems  Eyes: Positive for visual disturbance.  Neurological: Positive for dizziness.  All other systems reviewed and are negative.    Physical Exam Updated Vital Signs BP (!) 199/94   Pulse (!) 55   Temp 98 F (36.7 C) (Oral)   Resp 18   SpO2 100%   Physical Exam  Constitutional: He is oriented to person, place, and time. He appears well-developed and well-nourished.  No distress.  HENT:  Head: Normocephalic and atraumatic.  Cardiovascular: Normal rate, regular rhythm and normal heart sounds.   No murmur heard. Pulmonary/Chest: Effort normal and breath sounds normal. No respiratory distress.  Abdominal: Soft. He exhibits no distension. There is no tenderness.  Musculoskeletal:  Alert, oriented, thought content appropriate, able to give a coherent history. Speech is clear and goal oriented, able to follow commands.  Cranial Nerves:  II:  Peripheral visual fields grossly normal, pupils equal, round, reactive to light III, IV, VI: EOM intact bilaterally, ptosis not present V,VII: smile symmetric, eyes kept closed tightly against resistance, facial light touch sensation equal VIII: hearing grossly normal IX, X: symmetric soft palate movement, uvula elevates symmetrically  XI: bilateral shoulder shrug symmetric and strong XII: midline tongue extension 5/5 muscle strength in upper and lower extremities bilaterally including strong and equal grip strength and dorsiflexion/plantar flexion Sensory to light touch normal in all four extremities.  Normal rapid alternating movements; Mild ataxia of LUE. No drift. Gait not assessed.   Neurological: He is alert and oriented to person, place, and time.  Skin: Skin is warm and dry.  Nursing note and  vitals reviewed.    ED Treatments / Results  Labs (all labs ordered are listed, but only abnormal results are displayed) Labs Reviewed  CBC - Abnormal; Notable for the following:       Result Value   WBC 10.6 (*)    RBC 3.56 (*)    Hemoglobin 10.6 (*)    HCT 31.8 (*)    All other components within normal limits  DIFFERENTIAL - Abnormal; Notable for the following:    Monocytes Absolute 1.2 (*)    All other components within normal limits  COMPREHENSIVE METABOLIC PANEL - Abnormal; Notable for the following:    Chloride 100 (*)    Glucose, Bld 195 (*)    BUN 32 (*)    Creatinine, Ser 2.24 (*)    Albumin 3.3 (*)    GFR calc non Af Amer 30 (*)    GFR calc Af Amer 34 (*)    All other components within normal limits  I-STAT CHEM 8, ED - Abnormal; Notable for the following:    Chloride 98 (*)    BUN 39 (*)    Creatinine, Ser 2.30 (*)    Glucose, Bld 198 (*)    Hemoglobin 10.9 (*)    HCT 32.0 (*)    All other components within normal limits  PROTIME-INR  APTT  I-STAT TROPOININ, ED  CBG MONITORING, ED    EKG  EKG Interpretation None       Radiology Ct Head Wo Contrast  Result Date: 11/14/2016 CLINICAL DATA:  Dizziness, recent stroke EXAM: CT HEAD WITHOUT CONTRAST TECHNIQUE: Contiguous axial images were obtained from the base of the skull through the vertex without intravenous contrast. COMPARISON:  MR brain 11/03/2016 FINDINGS: Brain: No evidence of acute infarction, hemorrhage, extra-axial collection, ventriculomegaly, or mass effect. Subacute left cerebellar and left pontine infarct. Generalized cerebral atrophy. Periventricular white matter low attenuation likely secondary to microangiopathy. Vascular: Cerebrovascular atherosclerotic calcifications are noted. Skull: Negative for fracture or focal lesion. Sinuses/Orbits: Visualized portions of the orbits are unremarkable. Visualized portions of the paranasal sinuses and mastoid air cells are unremarkable. Other: None.  IMPRESSION: 1. No acute intracranial pathology. 2. Subacute left pontine and left cerebellar infarcts. Electronically Signed   By: Kathreen Devoid   On: 11/14/2016 21:40    Procedures Procedures (including critical care time)  Medications  Ordered in ED Medications  hydrALAZINE (APRESOLINE) tablet 100 mg (not administered)  hydrALAZINE (APRESOLINE) tablet 100 mg (100 mg Oral Given 11/15/16 0014)     Initial Impression / Assessment and Plan / ED Course  I have reviewed the triage vital signs and the nursing notes.  Pertinent labs & imaging results that were available during my care of the patient were reviewed by me and considered in my medical decision making (see chart for details).    John Jacobson is a 61 y.o. male who presents to ED for dizziness which began today. Admitted on 5/23 for left cerebellar infarct. He was discharged to home from rehab facility on Friday June 1st. He felt fine over the weekend, but dizziness similar to his presentation with prior infarct began today. CN's 2-12 intact. Slight ataxia of lue. Hypertensive. CT head shows no acute intracranial abnormalities, but does show subacute changes to left pontine and left cerebellum. Spoke with Dr. Leonel Ramsay of neurology who recommends obtaining MRI. Keep BP <220/120. Home dose of hydrazine which he missed today given. Hospitalist consulted who will admit.  Final Clinical Impressions(s) / ED Diagnoses   Final diagnoses:  Vertigo    New Prescriptions New Prescriptions   No medications on file     Kaytie Ratcliffe, Ozella Almond, PA-C 11/15/16 2800    Orpah Greek, MD 11/15/16 [phone]

## 2016-11-15 NOTE — Progress Notes (Signed)
Medications reviewed for dry mouth: Medications were reviewed, patient is not on any anticholinergic medications or other medications that would overtly cause dry mouth. Zepatier has not been known to cause dry mouth either. My first thought is that patient's dry mouth could be due to over-diuresis from Lasix.  Kyshaun Barnette, Jake Church  11/15/2016 1:25 PM

## 2016-11-15 NOTE — ED Provider Notes (Signed)
Patient presented to the ER with dizziness. Patient recently had a cerebellar stroke, discharged from rehabilitation 5 days ago. In the last 24 hours, patient has had increasing Instability and dizziness that was not present at discharge.  Face to face Exam: HEENT - PERRLA Lungs - CTAB Heart - RRR, no M/R/G Abd - S/NT/ND Neuro - alert, oriented x3; slight ataxia left upper extremity  Plan: Patient with dizziness and gait instability that has presented over the last 24 hours. This is concerning for possible extension or recurrent stroke, CT scan did not show anything acute. Will obtain MRI, neurology has recommended hospitalization for further management.   Orpah Greek, MD 11/15/16 Laureen Abrahams

## 2016-11-16 DIAGNOSIS — I639 Cerebral infarction, unspecified: Secondary | ICD-10-CM | POA: Diagnosis not present

## 2016-11-16 DIAGNOSIS — E1121 Type 2 diabetes mellitus with diabetic nephropathy: Secondary | ICD-10-CM | POA: Diagnosis not present

## 2016-11-16 DIAGNOSIS — I10 Essential (primary) hypertension: Secondary | ICD-10-CM | POA: Diagnosis not present

## 2016-11-16 DIAGNOSIS — N183 Chronic kidney disease, stage 3 (moderate): Secondary | ICD-10-CM | POA: Diagnosis not present

## 2016-11-16 DIAGNOSIS — Z794 Long term (current) use of insulin: Secondary | ICD-10-CM | POA: Diagnosis not present

## 2016-11-16 DIAGNOSIS — I5032 Chronic diastolic (congestive) heart failure: Secondary | ICD-10-CM | POA: Diagnosis not present

## 2016-11-16 DIAGNOSIS — B182 Chronic viral hepatitis C: Secondary | ICD-10-CM | POA: Diagnosis not present

## 2016-11-16 DIAGNOSIS — D649 Anemia, unspecified: Secondary | ICD-10-CM | POA: Diagnosis not present

## 2016-11-16 LAB — BASIC METABOLIC PANEL
ANION GAP: 9 (ref 5–15)
BUN: 36 mg/dL — AB (ref 6–20)
CALCIUM: 9 mg/dL (ref 8.9–10.3)
CO2: 25 mmol/L (ref 22–32)
Chloride: 100 mmol/L — ABNORMAL LOW (ref 101–111)
Creatinine, Ser: 2.3 mg/dL — ABNORMAL HIGH (ref 0.61–1.24)
GFR calc Af Amer: 33 mL/min — ABNORMAL LOW (ref >60)
GFR calc non Af Amer: 29 mL/min — ABNORMAL LOW (ref >60)
Glucose, Bld: 183 mg/dL — ABNORMAL HIGH (ref 65–99)
POTASSIUM: 4.2 mmol/L (ref 3.5–5.1)
Sodium: 134 mmol/L — ABNORMAL LOW (ref 135–145)

## 2016-11-16 LAB — GLUCOSE, CAPILLARY
GLUCOSE-CAPILLARY: 234 mg/dL — AB (ref 65–99)
Glucose-Capillary: 185 mg/dL — ABNORMAL HIGH (ref 65–99)

## 2016-11-16 MED ORDER — CARVEDILOL 6.25 MG PO TABS
6.2500 mg | ORAL_TABLET | Freq: Two times a day (BID) | ORAL | 0 refills | Status: DC
Start: 2016-11-16 — End: 2018-02-07

## 2016-11-16 MED ORDER — FUROSEMIDE 40 MG PO TABS: 40.0000 mg | ORAL_TABLET | ORAL | 0 refills | Status: DC

## 2016-11-16 MED ORDER — GLIPIZIDE 10 MG PO TABS
5.0000 mg | ORAL_TABLET | Freq: Every day | ORAL | 0 refills | Status: DC
Start: 2016-11-16 — End: 2018-02-07

## 2016-11-16 NOTE — Progress Notes (Signed)
Patient is discharged home. Discharge instructions were given to patient and family 

## 2016-11-16 NOTE — Progress Notes (Signed)
Physical Therapy Treatment Patient Details Name: John Jacobson MRN: [id number] DOB: 1956/06/10 Today's Date: 11/16/2016    History of Present Illness Pt is a 61 yo male presenting to the ER for vertigo. Pt recently (05/25) sustained a L cerebellar stroke and was in inpatient rehab for one week. Pt returned home on 6/1 and began to become symptomatic with dizziness and difficulty walking on 6/3. Upon admission to the ED BP was 220/110 and CT revealed no acute changes.    PT Comments    Pt presented with improved mobility today with no reports of dizziness symptoms. Pt able to ambulate 356ft only using SPC intermittently for stability. Pt demonstrated improved activity tolerance and no loss of balance during gait.  Pt would benefit from continued skilled PT to improve residual stroke deficits and improve balance. Current plan of care remains appropriate.   Follow Up Recommendations  Outpatient PT;Supervision - Intermittent     Equipment Recommendations  None recommended by PT    Recommendations for Other Services       Precautions / Restrictions Precautions Precautions: Fall Restrictions Weight Bearing Restrictions: No    Mobility  Bed Mobility               General bed mobility comments: received in chair  Transfers Overall transfer level: Independent               General transfer comment: No assistance needed for sit to stand  Ambulation/Gait Ambulation/Gait assistance: Modified independent (Device/Increase time) Ambulation Distance (Feet): 360 Feet Assistive device: Straight cane Gait Pattern/deviations: Step-through pattern Gait velocity: decreased Gait velocity interpretation: Below normal speed for age/gender General Gait Details: Pt mod I with SPC with no reports of dizziness or head spinning symptoms.    Stairs            Wheelchair Mobility    Modified Rankin (Stroke Patients Only) Modified Rankin (Stroke Patients Only) Pre-Morbid Rankin  Score: No symptoms Modified Rankin: Moderate disability     Balance Overall balance assessment: Needs assistance Sitting-balance support: No upper extremity supported;Feet supported Sitting balance-Leahy Scale: Good     Standing balance support: Single extremity supported;During functional activity Standing balance-Leahy Scale: Fair                              Cognition Arousal/Alertness: Awake/alert Behavior During Therapy: WFL for tasks assessed/performed Overall Cognitive Status: Within Functional Limits for tasks assessed                                        Exercises      General Comments        Pertinent Vitals/Pain Pain Assessment: No/denies pain    Home Living                      Prior Function            PT Goals (current goals can now be found in the care plan section) Acute Rehab PT Goals Patient Stated Goal: to go back to work PT Goal Formulation: With patient Time For Goal Achievement: 11/22/16 Potential to Achieve Goals: Good Progress towards PT goals: Progressing toward goals    Frequency    Min 4X/week      PT Plan Current plan remains appropriate    Co-evaluation  AM-PAC PT "6 Clicks" Daily Activity  Outcome Measure  Difficulty turning over in bed (including adjusting bedclothes, sheets and blankets)?: None Difficulty moving from lying on back to sitting on the side of the bed? : None Difficulty sitting down on and standing up from a chair with arms (e.g., wheelchair, bedside commode, etc,.)?: None Help needed moving to and from a bed to chair (including a wheelchair)?: A Little Help needed walking in hospital room?: A Little Help needed climbing 3-5 steps with a railing? : A Little 6 Click Score: 21    End of Session Equipment Utilized During Treatment: Gait belt Activity Tolerance: Patient tolerated treatment well Patient left: in chair;with call bell/phone within  reach Nurse Communication: Mobility status PT Visit Diagnosis: Unsteadiness on feet (R26.81);Difficulty in walking, not elsewhere classified (R26.2)     Time: 1140-1151 PT Time Calculation (min) (ACUTE ONLY): 11 min  Charges:  $Gait Training: 8-22 mins                    G Codes:  Functional Assessment Tool Used: Clinical judgement Functional Limitation: Mobility: Walking and moving around Mobility: Walking and Moving Around Current Status [phone]): At least 20 percent but less than 40 percent impaired, limited or restricted Mobility: Walking and Moving Around Goal Status [phone]): At least 1 percent but less than 20 percent impaired, limited or restricted    Loma Sousa, SPT  [phone]   Loma Sousa 11/16/2016, 2:14 PM

## 2016-11-16 NOTE — Care Management Note (Signed)
Case Management Note  Patient Details  Name: John Jacobson MRN: [id number] Date of Birth: 10/20/1955  Subjective/Objective:                    Action/Plan: Pt discharging home with self care. Pt was already active with [facility] prior to admission. New orders placed in EPIC for outpatient therapy and patient to call to schedule next visit (info on AVS).  Pt lives home with brother who has had recent surgery. He and brother are looking for more handicap accessible apartment. CM encouraged him to f/u with his Medicaid CM.  Pt states he has a daughter that assists him with grocery shopping and running errands. He was interested in having community transportation for MD appts, etc. CM provided him with numbers for Medicaid transportation, [employer] with application, senior wheels.  Pt states he has walker, 3 in 1, cane and shower chair at home. [facility] Sid[facility]. He plans on continuing with them for now and then possibly getting started with [facility] in the future. Pt states his daughter will be providing transport home today.   Expected Discharge Date:  11/16/16               Expected Discharge Plan:  Home/Self Care  In-House Referral:     Discharge planning Services  CM Consult  Post Acute Care Choice:    Choice offered to:     DME Arranged:    DME Agency:     HH Arranged:    HH Agency:     Status of Service:  Completed, signed off  If discussed at [employer] of Stay Meetings, dates discussed:    Additional Comments:  Pollie Friar, RN 11/16/2016, 12:11 PM

## 2016-11-16 NOTE — Discharge Summary (Signed)
Physician Discharge Summary  John Jacobson FYB:[id number] DOB: Jun 11, 1956 DOA: 11/14/2016  PCP: [facility] date: 11/14/2016 Discharge date: 11/16/2016  Admitted From: Home  Disposition: Home   Recommendations for Outpatient Follow-up:  1. Follow up with PCP in 1 weeks 2. Follow up with neurology, nephrology and physical medicine rehab as scheduled 3. Please obtain OP MRI pituitary mass protocol to evaluate pituitary lesion 4. Please obtain BMP/CBC in 1-2 weeks  Discharge Condition: STABLE  CODE STATUS: FULL  Diet recommendation: Heart Healthy / Carb Modified  Brief/Interim Summary: John A Smithis a 61 y.o.malerecently admitted from 5/24 until 5/25 for leftsuperior cerebellar artery infarct with d/c to CIR and eventual return home 11/11/2016. He was doing well following discharge, having some ataxia but overall doing well. Over the past 2 days, he has noticed dramatic increase in his vertigo and difficulty accomplishing his activities.  Workup included echo without embolic source, carotid Dopplers with no stenosis, LDL of 94, A1c of 8.6. He was discharged to CIR on aspirin and Plavix and statin was started.  He was LKW 11/14/2016, time unknown. Patient was not administered IV t-PA secondary to being outside of window. He was admitted for further evaluation and treatment.  CT Head Wo Contrast 11/14/2016 1. No acute intracranial pathology. 2. Subacute left pontine and left cerebellar infarcts.   MR Brain Wo Contrast 11/15/2016 1. Normal expected interval evolution of patchy left pontine and left cerebellar infarcts. 2. Otherwise stable appearance of the brain. Stable chronic microvascular ischemic disease. No other acute intracranial infarct or other process identified. 3. 3 mm pituitary lesion, indeterminate. Again, this could be further assessed with dedicated nonemergent pituitary mass protocol MRI as desired.   Mr. John Jacobson is a 61 y.o. male with history of recent  L superior cerebellar infarctright bundle branch block, chronic kidney disease, diastolic congestive heart failure, tobacco use, chronic back pain, `hypertension, diabetes, and asthma presenting with a dramatic increase in vertigo and difficulty accomplishing tasks. He did not receive IV t-PA due to delay in arrival.   Dizziness likely due to hypertensive urgency vs. Orthostasis  Resultant dizziness resolved with sitting down  CT head no acute stroke. Subacute L pontine and L cerebellar infarcts  MRI head expected evolution pathcy L pontine and L cerebellar infarcts. Small vessel disease. 34mm pituitary lesion, indeterminate.  LDL 94  HgbA1c 8.6 in May  Lovenox 40 mg sq daily for VTE prophylaxis  Diet heart healthy/carb modified Room service appropriate? Yes; Fluid consistency: Thin  aspirin 325 mg daily and clopidogrel 75 mg daily prior to admission, now on aspirin 325 mg daily and clopidogrel 75 mg daily. Continue DAPT on discharge  Patient counseled to be compliant with his antithrombotic medications  Ongoing aggressive stroke risk factor management  Therapy recommendations:  OP PT   Disposition:  Home   Hypertensive urgency  BP 215/100 on arrival  Stabilized this am at 154/78  Resumed all his home medication this time (added coreg 6.25 BID)              BP goal normotensive  Follow up with PCP for further titration and management.    Mild Orthostatic hypotension  Complains of positional related dizziness  Likely related to lasix, have reduced dose to 40 mg every other day   Hyperlipidemia  Home meds:  lipitor 20, resumed in hospital  LDL 94, goal < 70  Continue statin at discharge  Diabetes type 2  HgbA1c 8.6 in May, goal <  7.0  Uncontrolled  Hyperglycemia  SSI  On lantus, glipizide (dose reduced to 5 mg)  CBG (last 3)   Recent Labs  11/15/16 1626 11/15/16 2123 11/16/16 0628  GLUCAP 166* 264* 185*   Tobacco abuse  Current cigar  smoker  Smoking cessation counseling provided  Pt is willing to quit  Other Stroke Risk Factors  Advanced age  ETOH use, advised to drink no more than 1 drink(s) a day  Hx stroke/TIA ? 11/04/2016 leftsuperior cerebellar artery infarct involving left cerebellum and left pontine/midbrain d/t large vessel disease, treated w/ DAPT x 3 mos ? 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  Hx chronic hep C - on antiviral therapy  Discharge Diagnoses:  Principal Problem:   Stroke [facility]) Active Problems:   DM (diabetes mellitus), type 2 with renal complications (HCC)   CKD (chronic kidney disease), stage III   Normocytic anemia   Chronic diastolic CHF (congestive heart failure) (HCC)   Benign essential HTN   Hepatitis C virus infection without hepatic coma   History of stroke  Discharge Instructions  Discharge Instructions    Diet - low sodium heart healthy    Complete by:  As directed    Increase activity slowly    Complete by:  As directed      Allergies as of 11/16/2016      Reactions   Amlodipine Besy-benazepril Hcl Shortness Of Breath, Swelling   Mouth and tongue swelling   Shellfish Allergy Anaphylaxis      Medication List    TAKE these medications   aspirin 325 MG EC tablet Take 1 tablet (325 mg total) by mouth daily.   atorvastatin 20 MG tablet Commonly known as:  LIPITOR Take 1 tablet (20 mg total) by mouth daily at 6 PM.   Carboxymethylcellulose Sod PF 0.25 % Soln Apply 1 drop to eye 4 (four) times daily.   carvedilol 6.25 MG tablet Commonly known as:  COREG Take 1 tablet (6.25 mg total) by mouth 2 (two) times daily with a meal.   cholecalciferol 1000 units tablet Commonly known as:  VITAMIN D Take 2 tablets (2,000 Units total) by mouth daily.   cloNIDine 0.3 MG tablet Commonly known as:  CATAPRES Take 1 tablet (0.3 mg  total) by mouth 3 (three) times daily.   clopidogrel 75 MG tablet Commonly known as:  PLAVIX Take 1 tablet (75 mg total) by mouth daily.   doxazosin 4 MG tablet Commonly known as:  CARDURA Take 0.5 tablets (2 mg total) by mouth 2 (two) times daily.   Elbasvir-Grazoprevir 50-100 MG Tabs Take 1 tablet by mouth daily.   eucerin cream Apply 1 application topically daily. On feet   furosemide 40 MG tablet Commonly known as:  LASIX Take 1 tablet (40 mg total) by mouth every other day. Start taking on:  11/17/2016 What changed:  when to take this   glipiZIDE 10 MG tablet Commonly known as:  GLUCOTROL Take 0.5 tablets (5 mg total) by mouth daily before breakfast. What changed:  how much to take   glucose 4 GM chewable tablet Chew 1 tablet by mouth daily as needed for low blood sugar.   hydrALAZINE 100 MG tablet Commonly known as:  APRESOLINE Take 1 tablet (100 mg total) by mouth 3 (three) times daily.   insulin glargine 100 unit/mL Sopn Commonly known as:  LANTUS Inject 0.1 mLs (10 Units total) into the skin at bedtime.   losartan 100 MG tablet Commonly known as:  COZAAR Take 1 tablet (100 mg total) by mouth daily.   oxyCODONE 5 MG immediate release tablet Commonly known as:  Oxy IR/ROXICODONE Take 1-2 tablets (5-10 mg total) by mouth every 4 (four) hours as needed for moderate pain.   polyethylene glycol packet Commonly known as:  MIRALAX / GLYCOLAX Take 17 g by mouth daily. What changed:  when to take this  reasons to take this   saccharomyces boulardii 250 MG capsule Commonly known as:  FLORASTOR Take 1 capsule (250 mg total) by mouth 2 (two) times daily.   senna 8.6 MG Tabs tablet Commonly known as:  SENOKOT Take 1 tablet (8.6 mg total) by mouth daily. What changed:  when to take this  reasons to take this      Follow-up Information    Rosalin Hawking, MD. Go on 01/16/2017.   Specialty:  Neurology Contact information: [address] [place] [address] [phone]        [facility]. Schedule an appointment as soon as possible for a visit in 1 week(s).   Why:  Hospital Follow Up        Charlyne Petrin, MD. Schedule an appointment as soon as possible for a visit in 2 week(s).   Specialty:  Infectious Diseases Contact information: [place] [address] [phone]        Elmarie Shiley, MD. Schedule an appointment as soon as possible for a visit in 2 week(s).   Specialty:  Nephrology Contact information: [place] [address] [phone]        Charlett Blake, MD. Schedule an appointment as soon as possible for a visit in 1 week(s).   Specialty:  Physical Medicine and Rehabilitation Why:  Outpatient PT/OT  Contact information: [address] [place] [address] [phone]          Allergies  Allergen Reactions  . Amlodipine Besy-Benazepril Hcl Shortness Of Breath and Swelling    Mouth and tongue swelling  . Shellfish Allergy Anaphylaxis   Procedures/Studies: Dg Chest 2 View  Result Date: 11/02/2016 CLINICAL DATA:  61 y/o  M; weakness. EXAM: CHEST  2 VIEW COMPARISON:  03/28/2016 chest radiograph FINDINGS: Stable heart size and mediastinal contours are within normal limits given projection and technique. Both lungs are clear. Mild thoracic spine dextrocurvature. IMPRESSION: No active cardiopulmonary disease. Electronically Signed   By: Kristine Garbe M.D.   On: 11/02/2016 20:52   Ct Head Wo Contrast  Result Date: 11/14/2016 CLINICAL DATA:  Dizziness, recent stroke EXAM: CT HEAD WITHOUT CONTRAST TECHNIQUE: Contiguous axial images were obtained from the base of the skull through the vertex without intravenous contrast. COMPARISON:  MR brain 11/03/2016 FINDINGS: Brain: No evidence of acute infarction, hemorrhage, extra-axial collection, ventriculomegaly, or mass effect. Subacute left cerebellar and left pontine infarct. Generalized cerebral  atrophy. Periventricular white matter low attenuation likely secondary to microangiopathy. Vascular: Cerebrovascular atherosclerotic calcifications are noted. Skull: Negative for fracture or focal lesion. Sinuses/Orbits: Visualized portions of the orbits are unremarkable. Visualized portions of the paranasal sinuses and mastoid air cells are unremarkable. Other: None. IMPRESSION: 1. No acute intracranial pathology. 2. Subacute left pontine and left cerebellar infarcts. Electronically Signed   By: Kathreen Devoid   On: 11/14/2016 21:40   Ct Head Wo Contrast  Result Date: 11/02/2016 CLINICAL DATA:  Generalized weakness.  Difficulty  walking EXAM: CT HEAD WITHOUT CONTRAST TECHNIQUE: Contiguous axial images were obtained from the base of the skull through the vertex without intravenous contrast. COMPARISON:  None. FINDINGS: Brain: No acute intracranial hemorrhage. No focal mass lesion. No midline shift or mass effect. No hydrocephalus. Basilar cisterns are patent. There is hypodense region within the LEFT cerebellum adjacent the cerebral peduncle (image 11, series 3). This lesion measures approximately 1.7 x 1.4 cm. Vascular: No hyperdense vessel or unexpected calcification. Skull: Normal. Negative for fracture or focal lesion. Sinuses/Orbits: No acute finding. Other: None. IMPRESSION: Concern for LEFT cerebellar infarction.  Recommend brain MRI. These results will be called to the ordering clinician or representative by the Radiologist Assistant, and communication documented in the PACS or zVision Dashboard. Electronically Signed   By: Suzy Bouchard M.D.   On: 11/02/2016 21:51   Mr Brain Wo Contrast  Result Date: 11/15/2016 CLINICAL DATA:  Initial evaluation for acute dizziness. EXAM: MRI HEAD WITHOUT CONTRAST TECHNIQUE: Multiplanar, multiecho pulse sequences of the brain and surrounding structures were obtained without intravenous contrast. COMPARISON:  Prior CT from 11/14/2016 as well as recent MRI from  11/03/2016. FINDINGS: Brain: Stable atrophy with chronic small vessel ischemic disease. Scatter remote lacunar infarct present within the bilateral basal ganglia/ corona radiata. Remote lacunar infarct present at the left thalamus. Normal expected interval evolution seen about the recently identified left cerebellar and left pontine infarcts. Small amount of associated petechial hemorrhage at the left cerebellar infarcts (series 8, image 36). No significant mass effect. No other abnormal foci of restricted diffusion to suggest acute ischemic infarct. Gray-white matter differentiation maintained. Additional small subcentimeter chronic microhemorrhage noted within the left periventricular white matter (series 8, image 71). No evidence for acute intracranial hemorrhage. No mass lesion, midline shift or mass effect. No hydrocephalus. No extra-axial fluid collection. Major dural sinuses are grossly patent. Tiny 3 mm T1 hyperintense lesion again noted within the posterior pituitary gland, indeterminate. Suprasellar region unremarkable. Midline structures intact and normal. Vascular: Major intracranial vascular flow voids are maintained. Skull and upper cervical spine: Craniocervical junction within normal limits. Multilevel degenerative spondylolysis noted within the upper cervical spine without significant stenosis. Bone marrow signal intensity within normal limits. No scalp soft tissue abnormality. Sinuses/Orbits: Globes and orbital soft tissues within normal limits. Paranasal sinuses are grossly clear. No mastoid effusion. Inner ear structures normal. Other: None. IMPRESSION: 1. Normal expected interval evolution of patchy left pontine and left cerebellar infarcts. 2. Otherwise stable appearance of the brain. Stable chronic microvascular ischemic disease. No other acute intracranial infarct or other process identified. 3. 3 mm pituitary lesion, indeterminate. Again, this could be further assessed with dedicated  nonemergent pituitary mass protocol MRI as desired. Electronically Signed   By: Jeannine Boga M.D.   On: 11/15/2016 02:03   Mr Brain Wo Contrast  Result Date: 11/03/2016 CLINICAL DATA:  Initial evaluation for acute dizziness. EXAM: MRI HEAD WITHOUT CONTRAST MRA HEAD WITHOUT CONTRAST TECHNIQUE: Multiplanar, multiecho pulse sequences of the brain and surrounding structures were obtained without intravenous contrast. Angiographic images of the head were obtained using MRA technique without contrast. COMPARISON:  Prior CT from 11/02/2016. FINDINGS: MRI HEAD FINDINGS Brain: Cerebral volume within normal limits. Few scatter remote lacunar infarcts noted within the left basal ganglia/ corona radiata. Additional remote lacunar infarct present within the left thalamus. Patchy restricted diffusion within the superior left cerebellar hemisphere, consistent with acute ischemic infarct. Additional patchy infarct within the left aspect of the midbrain/ pons (series 5, image 20). No associated hemorrhage  or mass effect. Gray-white matter differentiation otherwise maintained. No other evidence for acute or subacute ischemia. Few punctate chronic micro hemorrhages noted within the left centrum semi ovale. These may be hypertensive in nature. No mass lesion, midline shift or mass effect. Ventricles normal size without evidence for hydrocephalus. No extra-axial fluid collection. Major dural sinuses are grossly patent. Pituitary gland of normal size. Tiny 3 mm T1 hyperintense lesion within the central aspect of the pituitary gland noted, indeterminate. Midline structures intact and normal. Vascular: Major intracranial vascular flow voids are maintained. Skull and upper cervical spine: Craniocervical junction normal. Visualized upper cervical spine unremarkable. Bone marrow signal intensity within normal limits. No scalp soft tissue abnormality. Sinuses/Orbits: Globes and orbital soft tissues within normal limits. Paranasal  sinuses are clear. No mastoid effusion. Inner ear structures normal. Other: None. MRA HEAD FINDINGS ANTERIOR CIRCULATION: Distal cervical segments of the internal carotid arteries are patent with antegrade flow. Petrous segments patent bilaterally without stenosis. Multifocal atheromatous irregularity present throughout the carotid siphons with moderate multifocal irregular narrowing. ICA termini patent. Left A1 segment dominant. Right A1 segment hypoplastic and/ or absent. Atheromatous irregularity present throughout the anterior cerebral arteries without high-grade flow-limiting stenosis. ACA is are patent to their distal aspects. M1 segments patent without high-grade stenosis or occlusion. No proximal M2 occlusion. Distal small vessel atheromatous irregularity throughout the MCA branches bilaterally. POSTERIOR CIRCULATION: Vertebral arteries code dominant and widely patent to the vertebrobasilar junction. Right PICA patent proximally. Left PICA not visualized. Basilar artery widely patent. Right SCA irregular but patent to its distal aspect. Severe stenosis present within the proximal left SCA (series 455). Flow distally is severely attenuated. PCAs arise from the basilar artery. Mild to moderate multifocal atheromatous irregularity involving the PCAs bilaterally without high-grade flow-limiting stenosis. PCAs are patent to their distal aspects. No aneurysm or vascular malformation. IMPRESSION: MRI HEAD IMPRESSION: 1. Patchy multifocal acute ischemic infarcts involving the superior left cerebellar hemisphere and left midbrain/pons. No associated hemorrhage or mass effect. 2. Scattered remote lacunar infarcts involving the bilateral basal ganglia/corona radiata as well as the left thalamus. 3. 3 mm pituitary lesion, indeterminate. Finding could be further assessed with nonemergent pituitary mass protocol MRI. MRA HEAD IMPRESSION: 1. Negative MRA for large or proximal arterial branch occlusion. 2. Severe proximal  left SCA stenosis with attenuated flow distally. No other high-grade or correctable stenosis identified. 3. Additional moderate multifocal atheromatous irregularity involving the anterior and posterior circulation as above. Electronically Signed   By: Jeannine Boga M.D.   On: 11/03/2016 01:50   Dg Foot Complete Right  Result Date: 11/02/2016 CLINICAL DATA:  57 y/o  M; stepped on a roofing nail. EXAM: RIGHT FOOT COMPLETE - 3+ VIEW COMPARISON:  None. FINDINGS: No acute fracture or dislocation. Second digit amputation absent phalanges. Lisfranc alignment is maintained. No articular abnormality is evident. IMPRESSION: No acute bony or articular abnormality identified. Electronically Signed   By: Kristine Garbe M.D.   On: 11/02/2016 20:54   Mr Jodene Nam Head/brain NL Cm  Result Date: 11/03/2016 CLINICAL DATA:  Initial evaluation for acute dizziness. EXAM: MRI HEAD WITHOUT CONTRAST MRA HEAD WITHOUT CONTRAST TECHNIQUE: Multiplanar, multiecho pulse sequences of the brain and surrounding structures were obtained without intravenous contrast. Angiographic images of the head were obtained using MRA technique without contrast. COMPARISON:  Prior CT from 11/02/2016. FINDINGS: MRI HEAD FINDINGS Brain: Cerebral volume within normal limits. Few scatter remote lacunar infarcts noted within the left basal ganglia/ corona radiata. Additional remote lacunar infarct present within the left  thalamus. Patchy restricted diffusion within the superior left cerebellar hemisphere, consistent with acute ischemic infarct. Additional patchy infarct within the left aspect of the midbrain/ pons (series 5, image 20). No associated hemorrhage or mass effect. Gray-white matter differentiation otherwise maintained. No other evidence for acute or subacute ischemia. Few punctate chronic micro hemorrhages noted within the left centrum semi ovale. These may be hypertensive in nature. No mass lesion, midline shift or mass effect.  Ventricles normal size without evidence for hydrocephalus. No extra-axial fluid collection. Major dural sinuses are grossly patent. Pituitary gland of normal size. Tiny 3 mm T1 hyperintense lesion within the central aspect of the pituitary gland noted, indeterminate. Midline structures intact and normal. Vascular: Major intracranial vascular flow voids are maintained. Skull and upper cervical spine: Craniocervical junction normal. Visualized upper cervical spine unremarkable. Bone marrow signal intensity within normal limits. No scalp soft tissue abnormality. Sinuses/Orbits: Globes and orbital soft tissues within normal limits. Paranasal sinuses are clear. No mastoid effusion. Inner ear structures normal. Other: None. MRA HEAD FINDINGS ANTERIOR CIRCULATION: Distal cervical segments of the internal carotid arteries are patent with antegrade flow. Petrous segments patent bilaterally without stenosis. Multifocal atheromatous irregularity present throughout the carotid siphons with moderate multifocal irregular narrowing. ICA termini patent. Left A1 segment dominant. Right A1 segment hypoplastic and/ or absent. Atheromatous irregularity present throughout the anterior cerebral arteries without high-grade flow-limiting stenosis. ACA is are patent to their distal aspects. M1 segments patent without high-grade stenosis or occlusion. No proximal M2 occlusion. Distal small vessel atheromatous irregularity throughout the MCA branches bilaterally. POSTERIOR CIRCULATION: Vertebral arteries code dominant and widely patent to the vertebrobasilar junction. Right PICA patent proximally. Left PICA not visualized. Basilar artery widely patent. Right SCA irregular but patent to its distal aspect. Severe stenosis present within the proximal left SCA (series 455). Flow distally is severely attenuated. PCAs arise from the basilar artery. Mild to moderate multifocal atheromatous irregularity involving the PCAs bilaterally without  high-grade flow-limiting stenosis. PCAs are patent to their distal aspects. No aneurysm or vascular malformation. IMPRESSION: MRI HEAD IMPRESSION: 1. Patchy multifocal acute ischemic infarcts involving the superior left cerebellar hemisphere and left midbrain/pons. No associated hemorrhage or mass effect. 2. Scattered remote lacunar infarcts involving the bilateral basal ganglia/corona radiata as well as the left thalamus. 3. 3 mm pituitary lesion, indeterminate. Finding could be further assessed with nonemergent pituitary mass protocol MRI. MRA HEAD IMPRESSION: 1. Negative MRA for large or proximal arterial branch occlusion. 2. Severe proximal left SCA stenosis with attenuated flow distally. No other high-grade or correctable stenosis identified. 3. Additional moderate multifocal atheromatous irregularity involving the anterior and posterior circulation as above. Electronically Signed   By: Jeannine Boga M.D.   On: 11/03/2016 01:50    (Echo, Carotid, EGD, Colonoscopy, ERCP)    Subjective: Pt without complaints, dizziness much better, wanting to go home.   Discharge Exam: Vitals:   11/16/16 0139 11/16/16 0555  BP: 135/71 (!) 154/78  Pulse: (!) 56 (!) 49  Resp: 18 18  Temp: 98.2 F (36.8 C) 97.9 F (36.6 C)   Vitals:   11/15/16 2140 11/15/16 2142 11/16/16 0139 11/16/16 0555  BP:  135/66 135/71 (!) 154/78  Pulse:   (!) 56 (!) 49  Resp:   18 18  Temp:   98.2 F (36.8 C) 97.9 F (36.6 C)  TempSrc:   Oral Oral  SpO2: 100%  100% 100%  Weight:      Height:       General: Pt is alert,  awake, not in acute distress Cardiovascular: RRR, S1/S2 +, no rubs, no gallops Respiratory: CTA bilaterally, no wheezing, no rhonchi Abdominal: Soft, NT, ND, bowel sounds + Extremities: no edema, no cyanosis Neurological: no new deficits.    The results of significant diagnostics from this hospitalization (including imaging, microbiology, ancillary and laboratory) are listed below for reference.      Microbiology: No results found for this or any previous visit (from the past 240 hour(s)).   Labs: BNP (last 3 results)  Recent Labs  03/28/16 2112 03/31/16 0329  BNP 658.7* 366.4*   Basic Metabolic Panel:  Recent Labs Lab 11/10/16 1055 11/11/16 0848 11/14/16 1852 11/14/16 1915 11/16/16 0603  NA 139 134* 135 138 134*  K 4.3 4.1 3.9 4.1 4.2  CL 105 101 100* 98* 100*  CO2 25 24 26   --  25  GLUCOSE 98 204* 195* 198* 183*  BUN 37* 33* 32* 39* 36*  CREATININE 2.31* 2.23* 2.24* 2.30* 2.30*  CALCIUM 9.4 9.4 9.1  --  9.0  PHOS  --  3.8  --   --   --    Liver Function Tests:  Recent Labs Lab 11/11/16 0848 11/14/16 1852  AST  --  22  ALT  --  25  ALKPHOS  --  88  BILITOT  --  0.7  PROT  --  7.2  ALBUMIN 3.5 3.3*   No results for input(s): LIPASE, AMYLASE in the last 168 hours. No results for input(s): AMMONIA in the last 168 hours. CBC:  Recent Labs Lab 11/14/16 1852 11/14/16 1915  WBC 10.6*  --   NEUTROABS 5.6  --   HGB 10.6* 10.9*  HCT 31.8* 32.0*  MCV 89.3  --   PLT 342  --    Cardiac Enzymes: No results for input(s): CKTOTAL, CKMB, CKMBINDEX, TROPONINI in the last 168 hours. BNP: Invalid input(s): POCBNP CBG:  Recent Labs Lab 11/15/16 0616 11/15/16 1122 11/15/16 1626 11/15/16 2123 11/16/16 0628  GLUCAP 233* 158* 166* 264* 185*   D-Dimer No results for input(s): DDIMER in the last 72 hours. Hgb A1c No results for input(s): HGBA1C in the last 72 hours. Lipid Profile No results for input(s): CHOL, HDL, LDLCALC, TRIG, CHOLHDL, LDLDIRECT in the last 72 hours. Thyroid function studies No results for input(s): TSH, T4TOTAL, T3FREE, THYROIDAB in the last 72 hours.  Invalid input(s): FREET3 Anemia work up No results for input(s): VITAMINB12, FOLATE, FERRITIN, TIBC, IRON, RETICCTPCT in the last 72 hours. Urinalysis    Component Value Date/Time   COLORURINE YELLOW 11/02/2016 1909   APPEARANCEUR CLEAR 11/02/2016 1909   LABSPEC 1.017  11/02/2016 1909   PHURINE 5.0 11/02/2016 1909   GLUCOSEU >=500 (A) 11/02/2016 1909   HGBUR SMALL (A) 11/02/2016 1909   BILIRUBINUR NEGATIVE 11/02/2016 1909   KETONESUR NEGATIVE 11/02/2016 1909   PROTEINUR >=300 (A) 11/02/2016 1909   NITRITE NEGATIVE 11/02/2016 1909   LEUKOCYTESUR NEGATIVE 11/02/2016 1909   Sepsis Labs Invalid input(s): PROCALCITONIN,  WBC,  LACTICIDVEN Microbiology No results found for this or any previous visit (from the past 240 hour(s)).  Time coordinating discharge: 33 minutes  SIGNED:  Irwin Brakeman, MD  [facility] 11/16/2016, 10:51 AM Pager [phone]  If 7PM-7AM, please contact night-coverage [email or web address] Password [email or web address]

## 2016-11-16 NOTE — Discharge Instructions (Signed)

## 2016-11-21 ENCOUNTER — Other Ambulatory Visit: Payer: Self-pay

## 2016-11-21 NOTE — Telephone Encounter (Signed)
This encounter was created in error - please disregard.

## 2016-11-21 NOTE — Patient Outreach (Signed)
[facility]) Care Management  11/21/2016  John Jacobson 10/06/1955 [id number]   EMMI: stroke Referral date: 11/21/16 Referral source: EMMI stroke red alert Referral reason: Lost interest in things they use to do: YES, Sad, hopeless, anxious, or empty: YES Day # 9 for Stroke coping  Telephone call to patient to patient regarding EMMI stroke red alert. Unable to reach patient. HIPAA compliant voice message left with call back phone number.    PLAN: RNCM will attempt 2nd telephone call to patient within 3 business days.   [name] RN,BSN,CCM [facility] Telephonic  [phone]

## 2016-11-22 ENCOUNTER — Other Ambulatory Visit: Payer: Self-pay

## 2016-11-22 NOTE — Patient Outreach (Addendum)
[facility]) Care Management  11/22/2016  John Jacobson [birth year] [id number]  EMMI: stroke Referral date: 11/21/16 Referral source: EMMI stroke red alert Referral reason: Lost interest in things they use to do: YES, Sad, hopeless, anxious, or empty: YES Day # 9 for Stroke coping  Second telephone call to patient to patient regarding EMMI stroke red alert. Unable to reach patient. HIPAA compliant voice message left with call back phone number.    PLAN: RNCM will attempt 3rd telephone call to patient within 3 business days.   [name] RN,BSN,CCM [facility] Telephonic  [phone]

## 2016-11-23 ENCOUNTER — Other Ambulatory Visit: Payer: Self-pay

## 2016-11-23 NOTE — Patient Outreach (Signed)
[facility]) Care Management  11/23/2016  DARIL WARGA 1955/07/07 [id number]  EMMI: stroke Referral date: 11/21/16 Referral source: EMMI stroke red alert Referral reason: Lost interest in things they use to do: YES, Sad, hopeless, anxious, or empty: YES Day # 9 for Stroke coping Attempt #3  Third  telephone call to patient to patient regarding EMMI stroke red alert. Unable to reach patient. HIPAA compliant voice message left with call back phone number.    PLAN: RNCM will close patient due to being unable to reach.   [name] RN,BSN,CCM [facility] Telephonic  [phone]

## 2016-11-24 ENCOUNTER — Encounter: Payer: Medicaid Other | Admitting: *Deleted

## 2016-11-24 ENCOUNTER — Ambulatory Visit: Payer: Medicaid Other | Admitting: Physical Therapy

## 2016-12-05 ENCOUNTER — Telehealth: Payer: Self-pay | Admitting: *Deleted

## 2016-12-05 NOTE — Telephone Encounter (Signed)
I do not recommend chronic narcotics for my stroke patients, I am following him for stroke rehabilitation.

## 2016-12-05 NOTE — Telephone Encounter (Signed)
John Jacobson is requesting a refill on his oxycodone. By [employer] his med was filled 11/11/16 #20 oxycodone 5 mg.  His appt is not until 01/03/17.  Please advise.

## 2016-12-06 NOTE — Telephone Encounter (Signed)
Notified John Jacobson. 

## 2016-12-06 NOTE — Telephone Encounter (Signed)
MR John Jacobson said ok but is there anything you can give him that might help with his shoulder and muscles hurting.  Walking with the cane has his shoulder hurting and the muscles are sore.

## 2016-12-06 NOTE — Telephone Encounter (Signed)
Left message for John Jacobson to call back.

## 2016-12-06 NOTE — Telephone Encounter (Signed)
Have patient try Tylenol as well as icy hot muscle cream and a heating pad

## 2016-12-13 ENCOUNTER — Ambulatory Visit: Payer: Medicaid Other | Attending: Family Medicine | Admitting: Rehabilitative and Restorative Service Providers"

## 2016-12-13 ENCOUNTER — Encounter: Payer: Self-pay | Admitting: Occupational Therapy

## 2016-12-13 ENCOUNTER — Ambulatory Visit: Payer: Medicaid Other | Admitting: Occupational Therapy

## 2016-12-13 DIAGNOSIS — R2681 Unsteadiness on feet: Secondary | ICD-10-CM | POA: Insufficient documentation

## 2016-12-13 DIAGNOSIS — R41842 Visuospatial deficit: Secondary | ICD-10-CM

## 2016-12-13 DIAGNOSIS — M6281 Muscle weakness (generalized): Secondary | ICD-10-CM

## 2016-12-13 DIAGNOSIS — M25512 Pain in left shoulder: Secondary | ICD-10-CM

## 2016-12-13 DIAGNOSIS — M25511 Pain in right shoulder: Secondary | ICD-10-CM

## 2016-12-13 DIAGNOSIS — R2689 Other abnormalities of gait and mobility: Secondary | ICD-10-CM | POA: Insufficient documentation

## 2016-12-13 NOTE — Therapy (Addendum)
[facility] [address] [place], [place], [address] Phone: [phone]   Fax:  [phone]  Occupational Therapy Evaluation  Patient Details  Name: John Jacobson MRN: [id number] Date of Birth: Oct 15, 1955 Referring Provider: Dr. Letta Jacobson  Encounter Date: 12/13/2016      OT End of Session - 12/13/16 1045    Visit Number 1   Number of Visits 9   Date for OT Re-Evaluation 01/23/17  to allow time for medicaid approval   Authorization Type Medicaid - will need to wait for approval. OT to ask for 8 visits with end date not before 02/10/2017 to allow time for medicaid to approve and scheduling.    Authorization Time Period TBD   Authorization - Visit Number 1   Authorization - Number of Visits 9   OT Start Time [phone]   OT Stop Time 1020   OT Time Calculation (min) 47 min   Activity Tolerance Patient tolerated treatment well      Past Medical History:  Diagnosis Date  . Asthma   . DM (diabetes mellitus) ([place])   . HTN (hypertension)   . RBBB (right bundle branch block)     Past Surgical History:  Procedure Laterality Date  . BACK SURGERY    . HAND SURGERY    . OTHER SURGICAL HISTORY    . OTHER SURGICAL HISTORY    . OTHER SURGICAL HISTORY    . OTHER SURGICAL HISTORY    . OTHER SURGICAL HISTORY    . OTHER SURGICAL HISTORY    . OTHER SURGICAL HISTORY    . OTHER SURGICAL HISTORY    . OTHER SURGICAL HISTORY    . OTHER SURGICAL HISTORY    . OTHER SURGICAL HISTORY    . OTHER SURGICAL HISTORY    . OTHER SURGICAL HISTORY    . OTHER SURGICAL HISTORY    . OTHER SURGICAL HISTORY    . OTHER SURGICAL HISTORY    . OTHER SURGICAL HISTORY    . OTHER SURGICAL HISTORY    . OTHER SURGICAL HISTORY    . OTHER SURGICAL HISTORY    . OTHER SURGICAL HISTORY    . OTHER SURGICAL HISTORY    . OTHER SURGICAL HISTORY    . OTHER SURGICAL HISTORY    . OTHER SURGICAL HISTORY    . OTHER SURGICAL HISTORY      There were no vitals  filed for this visit.      Subjective Assessment - 12/13/16 0941    Subjective  My vision is blurry   Pertinent History see epic.  Pt s/p CVA on 11/02/2016;  HTN - check BP, IDDM   Patient Stated Goals balance, strength, coordination especially on the key board.  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   Currently in Pain? Yes   Pain Score 5    Pain Location Shoulder   Pain Orientation Right;Left   Pain Descriptors / Indicators Aching   Pain Type Acute pain   Pain Onset More than a month ago   Pain Frequency Constant   Aggravating Factors  using the cane, cold seems to make it worse, reaching up   Pain Relieving Factors pain meds           John Jacobson OT Assessment - 12/13/16 0001      Assessment   Diagnosis CVA   Referring Provider Dr. Letta Jacobson   Onset Date 11/02/16   Prior Therapy inpt PT, OT, ST     Precautions  Precautions Fall     Restrictions   Weight Bearing Restrictions No     Balance Screen   Has the patient fallen in the past 6 months No  Pt has PT eval today     Home  Environment   Family/patient expects to be discharged to: Private residence   Living Arrangements Other relatives  brother with medical issues   Available Help at Discharge Available PRN/intermittently   Type of [place] One level   Bathroom Shower/Tub Haematologist   Additional Comments Pt has transfer tub bench, 3 in 1 over toilet     Prior Function   Level of Independence Independent   Vocation Part time employment   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    Leisure listen to music, play chess, watch tv, swim in a pool - used to be in the [employer]     ADL   Eating/Feeding Modified independent   Grooming Modified independent  increased time to shave due to vision   Upper Body Bathing Modified independent   Lower Body Bathing Modified independent   Upper Body Dressing  Increased time   Lower Body Dressing Increased time   Toilet Transfer Modified independent   Toileting - Clothing Manipulation Modified independent   [place] Transfer Modified independent     IADL   Shopping Assistance for transportation   Light Housekeeping Performs light daily tasks such as dishwashing, bed making   Meal Prep Plans, prepares and serves adequate meals independently   [place] on family or friends for transportation   Medication Management Is responsible for taking medication in correct dosages at correct time   Physiological scientist financial matters independently (budgets, writes checks, pays rent, bills goes to bank), collects and keeps track of income     Mobility   Mobility Status Needs assist   Mobility Status Comments needs supervision to close contact guard outside;  ambulates in home without assistance with cane. Pt reports he is now trying to walk inside without cane however pt is very unsteady without device.      Written Expression   Dominant Hand Right     Vision - History   Baseline Vision Wears glasses only for reading   Visual History Retinopathy   Additional Comments retinopathy was corrected with glasses and was getting injections at one point due to leakage behind the eyes.       Vision Assessment   Eye Alignment Impaired (comment)   Ocular Range of Motion Within Functional Limits   Tracking/Visual Pursuits Able to track stimulus in all quads without difficulty   Saccades Undershoots  especially with diagnonals and vertical eye movement   Convergence Within functional limits   Diplopia Assessment --  denies even with fast movement   Comment Pt with gaze stabilization deficits as well as motion sensitivity with horizontal head turns ( 4/10 with 15 second resolution and 2/10 with 5 second resolution).  Pt reports blurry vision which increases with reading and letters run  together.       Activity Tolerance   Activity Tolerance Tolerates 30 min activity with muliple rests     Cognition   Overall Cognitive Status Impaired/Different from baseline   Mini Mental State Exam  Pt feels his memory is slower. Will monitor via functional tasks. No other  deficits noted at this time. WIll continue to monitor     Sensation   Light Touch Impaired by gross assessment  very mildly on dorsum of R hand   Hot/Cold Appears Intact   Proprioception Appears Intact     Coordination   Gross Motor Movements are Fluid and Coordinated Yes  however movements are slow   Finger Nose Finger Test WFL's     Tone   Assessment Location Right Upper Extremity;Left Upper Extremity     ROM / Strength   AROM / PROM / Strength AROM;Strength     AROM   Overall AROM  Within functional limits for tasks performed     Strength   Overall Strength Within functional limits for tasks performed   Overall Strength Comments BUE's except for L grip strength     Hand Function   Right Hand Gross Grasp Functional   Right Hand Grip (lbs) 100   Left Hand Gross Grasp Impaired   Left Hand Grip (lbs) 70     RUE Tone   RUE Tone Within Functional Limits     LUE Tone   LUE Tone Within Functional Limits                              OT Long Term Goals - 12/13/16 1028      OT LONG TERM GOAL #1   Title Pt will be mod I with home activities program - 01/23/2017 (date adjusted to allow time for medicaid approval)   Baseline dependent   Status New     OT LONG TERM GOAL #2   Title Pt will demonstrate improved grip strength by at least 5 pounds with L hand to assist with functional tasks.    Baseline L grip = 70 pounds R grip = 100   Status New     OT LONG TERM GOAL #3   Title Pt will rate bilateral shoulder pain no more than 2/10 with reaching activities such as putting away dishes or laundry in closet.    Baseline 4/10   Status New     OT LONG TERM GOAL #4   Title Pt will  report intensity of disequilibrium no greater than 3/10 and with duration no greater than 8 seconds to reduce fall risk with home mgmt tasks.    Baseline currently in sitting 4/10 with duration of 15 seconds. Rating is higher in standing or with functional ambulation.    Status New     OT LONG TERM GOAL #6   Title Pt will verbalize understanding of return to driving recommendations for safety   Baseline unable at this time   Status New     OT LONG TERM GOAL #7   Title Pt will be able to type at three sentence level at computer with at least 90% accuracy as precursor to possible return to work.    Baseline unable to do at this time   Status New               Plan - 12/13/16 1035    Clinical Impression Statement Pt is a 61 year old male s/p multifocal L cerebellar infarcts, midbrain infarcts and bilaterla basal ganglia infarcts (ICD 10 code 163.8 other cerebral infarct)  on 11/02/2016.  Pt was discharged home on 11/10/2016 after brief stay in inpt rehab.  Pt presents today with the following deficits that impact ADL, IADL, return to work and leisure:  impaired  balance, impaired visual vestibular integration, impaired gaze stabilzation, blurry vision, impaired saccadic eye movement, decreased L grip strength, pain both shoulders, decreased activity tolerance.  Pt will benefit from skilled OT to address these deficits and maximize independence.    Occupational Profile and client history currently impacting functional performance asthma, DM, HTN, CKD stage 3, CHF, +tobacco use.     Occupational performance deficits (Please refer to evaluation for details): ADL's;IADL's;Work;Leisure   Rehab Potential Good   Current Impairments/barriers affecting progress: Pt could benefit from additonal OT visits however has limited benefits   OT Frequency 2x / week   OT Duration 4 weeks   OT Treatment/Interventions Self-care/ADL training;Therapeutic exercise;Neuromuscular education;Passive range of  motion;Manual Therapy;Therapist, nutritional;Therapeutic activities;Visual/perceptual remediation/compensation;Patient/family education;Balance training   Plan initiate HEP, address shouder pain, NMR for balance and visual vestibular issues, L grip strength, functional mobility   Clinical Decision Making Multiple treatment options, significant modification of task necessary   Recommended Other Services PT   Consulted and Agree with Plan of Care Patient      Patient will benefit from skilled therapeutic intervention in order to improve the following deficits and impairments:  Abnormal gait, Decreased activity tolerance, Decreased balance, Decreased mobility, Decreased strength, Difficulty walking, Impaired UE functional use, Impaired vision/preception, Pain  Visit Diagnosis: Unsteadiness on feet - Plan: Ot plan of care cert/re-cert  Visuospatial deficit - Plan: Ot plan of care cert/re-cert  Acute pain of right shoulder - Plan: Ot plan of care cert/re-cert  Acute pain of left shoulder - Plan: Ot plan of care cert/re-cert  Muscle weakness (generalized) - Plan: Ot plan of care cert/re-cert    Problem List Patient Active Problem List   Diagnosis Date Noted  . Stroke ([place]) 11/15/2016  . History of stroke   . Labile blood glucose   . Hepatitis C virus infection without hepatic coma   . AKI (acute kidney injury) ([place])   . Brainstem infarct, acute ([place])   . Benign essential HTN   . Hypokalemia   . Leukocytosis   . Acute blood loss anemia   . 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 Cerebellar infarction ([place]) 11/04/2016  . Hyperlipidemia   . Gait disturbance, post-stroke   . CKD (chronic kidney disease), stage III 11/02/2016  . Normocytic anemia 11/02/2016  . Ataxia 11/02/2016  . Ischemic stroke ([place]) 11/02/2016  . Puncture wound of right foot 11/02/2016  . Chronic diastolic CHF (congestive heart failure) ([place])  11/02/2016  . Chronic left-sided low back pain 11/02/2016  . Demand ischemia ([place])   . ARF (acute renal failure) ([place])   . Hypertensive urgency 03/29/2016  . Abnormal electrocardiogram 05/24/2011  . Chest pain 05/24/2011  . Tobacco abuse 05/24/2011  . DM (diabetes mellitus), type 2 with renal complications ([place]) 46/96/2952    Quay Burow, OTR/L 12/13/2016, 10:51 AM  [facility] [address] [place], [place], [address] Phone: [phone]   Fax:  [phone]  Name: John Jacobson MRN: [id number] Date of Birth: 1956/05/02

## 2016-12-13 NOTE — Patient Instructions (Signed)
Feet Together, Varied Arm Positions - Eyes Closed    Stand with feet together and arms out. Close eyes and visualize upright position. Hold __10__ seconds. Repeat _3___ times per session. Do ___2_ sessions per day.  Copyright  VHI. All rights reserved.   Feet Partial Heel-Toe, Varied Arm Positions - Eyes Open    With eyes open, right foot partially in front of the other, arms out, look straight ahead at a stationary object. Hold __30__ seconds. Repeat _3___ times with each foot forward per session. Do __2__ sessions per day.  Copyright  VHI. All rights reserved.

## 2016-12-13 NOTE — Therapy (Signed)
[facility] [address] [name], [place], [address] Phone: [phone]   Fax:  [phone]  Physical Therapy Evaluation  Patient Details  Name: John Jacobson MRN: [id number] Date of Birth: [birth year] Referring Provider: Alysia Penna, MD  Encounter Date: 12/13/2016      PT End of Session - 12/13/16 1943    Visit Number 1   Number of Visits 16   Date for PT Re-Evaluation 02/11/17   Authorization Type medicaid *OT submitting for 8 visits, PT submitting for 16 visits   PT Start Time 1018   PT Stop Time 1100   PT Time Calculation (min) 42 min   Activity Tolerance Patient tolerated treatment well   Behavior During Therapy [facility] for tasks assessed/performed      Past Medical History:  Diagnosis Date  . Asthma   . DM (diabetes mellitus) ([place])   . HTN (hypertension)   . RBBB (right bundle branch block)     Past Surgical History:  Procedure Laterality Date  . BACK SURGERY    . HAND SURGERY    . OTHER SURGICAL HISTORY    . OTHER SURGICAL HISTORY    . OTHER SURGICAL HISTORY    . OTHER SURGICAL HISTORY    . OTHER SURGICAL HISTORY    . OTHER SURGICAL HISTORY    . OTHER SURGICAL HISTORY    . OTHER SURGICAL HISTORY    . OTHER SURGICAL HISTORY    . OTHER SURGICAL HISTORY    . OTHER SURGICAL HISTORY    . OTHER SURGICAL HISTORY    . OTHER SURGICAL HISTORY    . OTHER SURGICAL HISTORY    . OTHER SURGICAL HISTORY    . OTHER SURGICAL HISTORY    . OTHER SURGICAL HISTORY    . OTHER SURGICAL HISTORY    . OTHER SURGICAL HISTORY    . OTHER SURGICAL HISTORY    . OTHER SURGICAL HISTORY    . OTHER SURGICAL HISTORY    . OTHER SURGICAL HISTORY    . OTHER SURGICAL HISTORY    . OTHER SURGICAL HISTORY    . OTHER SURGICAL HISTORY      There were no vitals filed for this visit.       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  He was transferred  to IP rehab on 11/04/16 and d/c home on 11/11/2016.  He returned to ED on 6/4 for increased dizziness, and noted he was dehydrated at that time.   He c/o imbalance with turns, slowed speed and difficulty picking up objects from the floor.    Pertinent History Diabetes, kidney disease, asthma, HTN, h/o back surgery, R toe amputation.   Patient Stated Goals Improve balance, be able to run again, return to swimming.    Currently in Pain? Yes   Pain Score 5    Pain Location Shoulder   Pain Orientation Right;Left   Pain Descriptors / Indicators Aching   Pain Type Acute pain   Pain Onset More than a month ago   Pain Frequency Constant   Aggravating Factors  using the cane, reaching overhead   Pain Relieving Factors heat helps "I sit in the sun"            [facility] PT Assessment - 12/13/16 1032      Assessment   Medical Diagnosis L cerebellar stroke   Referring Provider Alysia Penna, MD   Onset Date/Surgical  Date 11/02/16   Hand Dominance Right   Prior Therapy acute and IP rehab     Precautions   Precautions Fall     Restrictions   Weight Bearing Restrictions No     Balance Screen   Has the patient fallen in the past 6 months No   Has the patient had a decrease in activity level because of a fear of falling?  Yes  less active due to recent CVA   Is the patient reluctant to leave their home because of a fear of falling?  Yes     [place] Private residence   Living Arrangements --  lives with brother--they are independent of each other   Type of [place] to enter   Entrance Stairs-Number of Steps 1   Entrance Stairs-Rails None   Home Layout One level   [place] - single point;Shower seat   Additional Comments *His prior toe amputation has hindered activities.      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    Leisure listen to music, play chess, watch tv, swim in a pool - used to be in the [employer]     Observation/Other Assessments   Focus on Therapeutic Outcomes (FOTO)  40%   Other Surveys  Other Surveys   Stroke Impact Scale  44.4%     Sensation   Light Touch Appears Intact  in LEs per report     ROM / Strength   AROM / PROM / Strength AROM;Strength     AROM   Overall AROM  Within functional limits for tasks performed     Strength   Strength Assessment Site Hip;Knee;Ankle   Right/Left Hip Right;Left   Right Hip Flexion 4+/5   Right Hip Extension 4+/5   Left Hip Flexion 3+/5   Left Hip Extension 4-/5   Left Hip ABduction 3/5   Right Knee Flexion 5/5   Right Knee Extension 5/5   Left Knee Flexion 5/5   Left Knee Extension 5/5   Right/Left Ankle Right;Left   Right Ankle Dorsiflexion 4/5   Right Ankle Plantar Flexion 4/5   Left Ankle Dorsiflexion 4/5   Left Ankle Plantar Flexion 4/5     Ambulation/Gait   Ambulation/Gait Yes   Ambulation/Gait Assistance 5: Supervision   Ambulation/Gait Assistance Details Patient requests to do things independently.  PT provides close supervision.   Ambulation Distance (Feet) 200 Feet   Assistive device Straight cane;None   Gait Pattern Step-through pattern;Decreased arm swing - right;Decreased arm swing - left;Decreased stride length;Wide base of support   Ambulation Surface Level;Indoor   Gait velocity 2.65ft/sec   Stairs Yes   Stairs Assistance 5: Supervision   Stairs Assistance Details (indicate cue type and reason) Patient ascends steps with reciprocal pattern and descends with step to pattern.    Stair Management Technique One rail Right;Alternating pattern;Step to pattern   Number of Stairs 4   Gait Comments Patient requires min A for gait with horizontal head turns.      Standardized Balance Assessment   Standardized Balance Assessment Berg Balance Test     Berg Balance Test   Sit to  Stand Able to stand  independently using hands   Standing Unsupported Able to stand safely 2 minutes  Sitting with Back Unsupported but Feet Supported on Floor or Stool Able to sit safely and securely 2 minutes   Stand to Sit Sits safely with minimal use of hands   Transfers Able to transfer safely, definite need of hands   Standing Unsupported with Eyes Closed Able to stand 10 seconds with supervision   Standing Ubsupported with Feet Together Able to place feet together independently and stand for 1 minute with supervision   From Standing, Reach Forward with Outstretched Arm Can reach forward >12 cm safely (5")   From Standing Position, Pick up Object from [place] to pick up shoe, needs supervision   From Standing Position, Turn to Look Behind Over each Shoulder Needs supervision when turning   Turn 360 Degrees Able to turn 360 degrees safely but slowly   Standing Unsupported, Alternately Place Feet on Step/Stool Able to complete 4 steps without aid or supervision   Standing Unsupported, One Foot in [employer] balance while stepping or standing   Standing on One Leg Tries to lift leg/unable to hold 3 seconds but remains standing independently   Total Score 36   Berg comment: 36/56 indicating risk for falls            Objective measurements completed on examination: See above findings.                  PT Education - 12/13/16 1942    Education provided Yes   Education Details Recommended patient perform narrowing base of support HEP in corner for safety *see handout   Person(s) Educated Patient   Methods Explanation;Demonstration;Handout   Comprehension Verbalized understanding;Returned demonstration          PT Short Term Goals - 12/13/16 1943      PT SHORT TERM GOAL #1   Title The patient will return demo HEP for L LE strength, narrow base of support balance, and dynamic gait activities.  SHORT TERM GOAL TARGET DATE 01/12/2017   Baseline No current HEP.    Time 4   Period Weeks     PT SHORT TERM GOAL #2   Title The patient will improve Berg score up to > or equal to 42/56 to demo improving functional balance.   Baseline 36/56   Time 4   Period Weeks     PT SHORT TERM GOAL #3   Title The patient will improve gait speed up to > or equal to 2.62 ft/sec without an assistive device independently on level surfaces.   Baseline 2.11 ft/sec   Time 4   Period Weeks     PT SHORT TERM GOAL #4   Title The patient will increase LE strength in hip flexion and hip abduction to 4/5.   Baseline L hip flexion 3+/5, L hip abduction 3/5   Time 4   Period Weeks     PT SHORT TERM GOAL #5   Title The patient will negotiate 4 stairs with one handrail with reciprocal pattern modified indep.   Baseline Patient requires supervision, uses step to pattern to descent.   Time 4   Period Weeks           PT Long Term Goals - 12/13/16 1949      PT LONG TERM GOAL #1   Title The patient will improve stroke impact scale mobility by 12% to demo improved perception of mobility.   Baseline 44.4%   Time 8   Period Weeks     PT LONG TERM GOAL #2  Title The patient will improve Berg to > or equal to 45/56 to demo dec'd risk for falls.   Baseline Berg=36/56.   Time 8   Period Weeks     PT LONG TERM GOAL #3   Title The patient will improve gait speed to > or equal to 3.0 ft/sec to demo improving functional mobility.   Baseline Gait speed=2.11 ft/sec   Time 8   Period Weeks     PT LONG TERM GOAL #4   Title The patient will negotiate community surfaces without a device mod indep x 1200 ft for return to community mobility.   Baseline Patient uses SPC x short, household distances.   Time 8   Period Weeks     PT LONG TERM GOAL #5   Title The patient will ambulate with horizontal head motion x 100 ft (every 4 steps head turn) without loss of balance mod indep.   Baseline Patient requires min A for head motion with gait.   Time 8   Period Weeks                 Plan - 12/13/16 1952    Clinical Impression Statement Pt is a 61 year old male s/p multifocal L cerebellar infarcts, midbrain infarcts and bilaterla basal ganglia infarcts (ICD 10 code 163.8 other cerebral infarct)  on 11/02/2016.  Pt was discharged home on 11/10/2016 after brief stay in inpt rehab.  He presents today with high fall risk per Berg score, limited gait speed, dec'd dynamic gait, and L LE weakness.  PT to address deficits to promote improved independence during ADLs and mobility.     History and Personal Factors relevant to plan of care: Dec'd ability to work, R toe amputation, diabetes, kidney disease   Clinical Presentation Stable   Clinical Decision Making Low   Rehab Potential Good   Clinical Impairments Affecting Rehab Potential Patient motivated to participate   PT Frequency 2x / week   PT Duration 8 weeks   PT Treatment/Interventions ADLs/Self Care Home Management;Therapeutic activities;Therapeutic exercise;Balance training;Neuromuscular re-education;Gait training;DME Instruction;Stair training;Functional mobility training;Patient/family education   PT Next Visit Plan Check HEP; static standing balance with narrowing base + eyes closed, head motion, turns, dynamic balance activities   Consulted and Agree with Plan of Care Patient      Patient will benefit from skilled therapeutic intervention in order to improve the following deficits and impairments:  Abnormal gait, Difficulty walking, Decreased balance, Decreased mobility, Decreased strength, Postural dysfunction  Visit Diagnosis: Other abnormalities of gait and mobility  Muscle weakness (generalized)  Unsteadiness on feet     Problem List Patient Active Problem List   Diagnosis Date Noted  . Stroke ([place]) 11/15/2016  . History of stroke   . Labile blood glucose   . Hepatitis C virus infection without hepatic coma   . AKI (acute kidney injury) ([place])   . Brainstem infarct, acute ([place])   .  Benign essential HTN   . Hypokalemia   . Leukocytosis   . Acute blood loss anemia   . 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 Cerebellar infarction ([place]) 11/04/2016  . Hyperlipidemia   . Gait disturbance, post-stroke   . CKD (chronic kidney disease), stage III 11/02/2016  . Normocytic anemia 11/02/2016  . Ataxia 11/02/2016  . Ischemic stroke ([place]) 11/02/2016  . Puncture wound of right foot 11/02/2016  . Chronic diastolic CHF (congestive heart failure) ([place]) 11/02/2016  . Chronic  left-sided low back pain 11/02/2016  . Demand ischemia ([place])   . ARF (acute renal failure) ([place])   . Hypertensive urgency 03/29/2016  . Abnormal electrocardiogram 05/24/2011  . Chest pain 05/24/2011  . Tobacco abuse 05/24/2011  . DM (diabetes mellitus), type 2 with renal complications ([place]) 57/97/2820    [place], PT 12/13/2016, 10:11 PM  [facility] [address] [place], [place], [address] Phone: [phone]   Fax:  [phone]  Name: John Jacobson MRN: [id number] Date of Birth: 1955/08/05

## 2016-12-19 ENCOUNTER — Encounter: Payer: Medicaid Other | Admitting: Occupational Therapy

## 2016-12-20 ENCOUNTER — Inpatient Hospital Stay: Payer: Medicaid Other | Admitting: Physical Medicine & Rehabilitation

## 2016-12-28 ENCOUNTER — Ambulatory Visit: Payer: Medicaid Other | Admitting: Rehabilitative and Restorative Service Providers"

## 2016-12-28 ENCOUNTER — Ambulatory Visit: Payer: Medicaid Other | Admitting: Occupational Therapy

## 2016-12-28 ENCOUNTER — Encounter: Payer: Self-pay | Admitting: Diagnostic Neuroimaging

## 2016-12-28 ENCOUNTER — Ambulatory Visit (INDEPENDENT_AMBULATORY_CARE_PROVIDER_SITE_OTHER): Payer: Medicaid Other | Admitting: Diagnostic Neuroimaging

## 2016-12-28 VITALS — BP 206/102

## 2016-12-28 VITALS — BP 202/90 | HR 55 | Ht 67.0 in | Wt 179.6 lb

## 2016-12-28 DIAGNOSIS — I63442 Cerebral infarction due to embolism of left cerebellar artery: Secondary | ICD-10-CM

## 2016-12-28 DIAGNOSIS — M6281 Muscle weakness (generalized): Secondary | ICD-10-CM

## 2016-12-28 DIAGNOSIS — I160 Hypertensive urgency: Secondary | ICD-10-CM | POA: Diagnosis not present

## 2016-12-28 DIAGNOSIS — E1121 Type 2 diabetes mellitus with diabetic nephropathy: Secondary | ICD-10-CM | POA: Diagnosis not present

## 2016-12-28 DIAGNOSIS — R2689 Other abnormalities of gait and mobility: Secondary | ICD-10-CM

## 2016-12-28 DIAGNOSIS — Z794 Long term (current) use of insulin: Secondary | ICD-10-CM | POA: Diagnosis not present

## 2016-12-28 DIAGNOSIS — R2681 Unsteadiness on feet: Secondary | ICD-10-CM

## 2016-12-28 NOTE — Progress Notes (Signed)
[facility]  PATIENT: John Jacobson DOB: Jan 29, 1956  REFERRING CLINICIAN: Anguilli, D HISTORY FROM: patient and daughter  REASON FOR VISIT: new consult    HISTORICAL  CHIEF COMPLAINT:  Chief Complaint  Patient presents with  . Hospitalization Follow-up  . Cerebrovascular Accident    11-14-16. (pituitary lesion)  Bp elevated has not taken medication this am.      HISTORY OF PRESENT ILLNESS:   61 year old right-handed male with hypertension, diabetes, tobacco abuse, chronic kidney disease, congestive heart failure, here for evaluation of stroke.  11/02/16, patient presented to Hospital due to an coordination, dizziness, difficulty walking. On 10/28/16 patient had stepped on a nail with his right foot. Patient continued to have problems with balance and walking. Upon presentation to hospital he had CT scan and MRI which confirmed mid brain and cerebellar ischemic infarctions. Patient also found to have hypertensive urgency as well. Patient was treated with dual antiplatelets, statin, blood pressure and diabetes control. Patient then discharged to inpatient rehabilitation. Patient continued with therapy evaluation and treatment. Patient finally discharged home on 11/10/16.  Patient returned to the hospital on 11/15/16 for increasing dizziness, found to be related to hypertensive urgency. MRI did not show any new acute findings. Patient was discharged the next day.  Since that time patient is doing fairly well. He is struggling with his blood pressure control. He did not eat breakfast this morning and therefore did not take his medication this morning. Typically his home blood pressure runs 150s / 70s.     REVIEW OF SYSTEMS: Full 14 system review of systems performed and negative with exception of: Weight gain chills fatigue blurred vision eye pain aching muscles impotence racing thoughts feeling cold eye pain blurred vision.  ALLERGIES: Allergies  Allergen Reactions    . Amlodipine Besy-Benazepril Hcl Shortness Of Breath and Swelling    Mouth and tongue swelling  . Shellfish Allergy Anaphylaxis    HOME MEDICATIONS: Outpatient Medications Prior to Visit  Medication Sig Dispense Refill  . aspirin EC 325 MG EC tablet Take 1 tablet (325 mg total) by mouth daily. 30 tablet 2  . atorvastatin (LIPITOR) 20 MG tablet Take 1 tablet (20 mg total) by mouth daily at 6 PM. 30 tablet 1  . Carboxymethylcellulose Sod PF 0.25 % SOLN Apply 1 drop to eye 4 (four) times daily.    . carvedilol (COREG) 6.25 MG tablet Take 1 tablet (6.25 mg total) by mouth 2 (two) times daily with a meal. 60 tablet 0  . cholecalciferol (VITAMIN D) 1000 units tablet Take 2 tablets (2,000 Units total) by mouth daily. 30 tablet 1  . cloNIDine (CATAPRES) 0.3 MG tablet Take 1 tablet (0.3 mg total) by mouth 3 (three) times daily. 90 tablet 1  . clopidogrel (PLAVIX) 75 MG tablet Take 1 tablet (75 mg total) by mouth daily. 30 tablet 1  . doxazosin (CARDURA) 4 MG tablet Take 0.5 tablets (2 mg total) by mouth 2 (two) times daily. 60 tablet 1  . furosemide (LASIX) 40 MG tablet Take 1 tablet (40 mg total) by mouth every other day. 30 tablet 0  . glipiZIDE (GLUCOTROL) 10 MG tablet Take 0.5 tablets (5 mg total) by mouth daily before breakfast. 30 tablet 0  . glucose 4 GM chewable tablet Chew 1 tablet by mouth daily as needed for low blood sugar.    . hydrALAZINE (APRESOLINE) 100 MG tablet Take 1 tablet (100 mg total) by mouth 3 (three) times daily. 90 tablet 1  . insulin  glargine (LANTUS) 100 unit/mL SOPN Inject 0.1 mLs (10 Units total) into the skin at bedtime. 15 mL 11  . losartan (COZAAR) 100 MG tablet Take 1 tablet (100 mg total) by mouth daily. 30 tablet 0  . polyethylene glycol (MIRALAX / GLYCOLAX) packet Take 17 g by mouth daily. (Patient taking differently: Take 17 g by mouth daily as needed for mild constipation. ) 14 each 0  . saccharomyces boulardii (FLORASTOR) 250 MG capsule Take 1 capsule (250 mg  total) by mouth 2 (two) times daily. 60 capsule 0  . senna (SENOKOT) 8.6 MG TABS tablet Take 1 tablet (8.6 mg total) by mouth daily. (Patient taking differently: Take 1 tablet by mouth daily as needed for mild constipation. ) 120 each 0  . Skin Protectants, Misc. (EUCERIN) cream Apply 1 application topically daily. On feet    . Elbasvir-Grazoprevir 50-100 MG TABS Take 1 tablet by mouth daily.     No facility-administered medications prior to visit.     PAST MEDICAL HISTORY: Past Medical History:  Diagnosis Date  . Asthma   . DM (diabetes mellitus) ([place])   . HTN (hypertension)   . RBBB (right bundle branch block)     PAST SURGICAL HISTORY: Past Surgical History:  Procedure Laterality Date  . BACK SURGERY  1995  . TOE AMPUTATION  2016    FAMILY HISTORY: Family History  Problem Relation Age of Onset  . Coronary artery disease Mother        MI in her 94s  . Hypertension Unknown   . Diabetes Unknown   . Alzheimer's disease Unknown   . Coronary artery disease Brother     SOCIAL HISTORY:  Social History   Social History  . Marital status: Single    Spouse name: N/A  . Number of children: 3  . Years of education: N/A   Occupational History  . Not on file.   Social History Main Topics  . Smoking status: Former Research scientist (life sciences)  . Smokeless tobacco: Never Used  . Alcohol use No     Comment: Few beers every other day hx  . Drug use: No  . Sexual activity: Not Currently   Other Topics Concern  . Not on file   Social History Narrative   Lives a home with brother.  College grad.  Children - 3   Caffeine 2-3 cups coffee / wk     PHYSICAL EXAM  GENERAL EXAM/CONSTITUTIONAL: Vitals:  Vitals:   12/28/16 0800  BP: (!) 202/90  Pulse: (!) 55  Weight: 179 lb 9.6 oz (81.5 kg)  Height: 5\' 7"  (1.702 m)     Body mass index is 28.13 kg/m.  Visual Acuity Screening   Right eye Left eye Both eyes  Without correction: 20/50 20/200   With correction:        Patient is in no  distress; well developed, nourished and groomed; neck is supple  CARDIOVASCULAR:  Examination of carotid arteries is normal; no carotid bruits  Regular rate and rhythm, no murmurs  Examination of peripheral vascular system by observation and palpation is normal  EYES:  Ophthalmoscopic exam of optic discs and posterior segments is normal; no papilledema or hemorrhages  BILATERAL CATARACTS; DECR LIGHT REFLEX IN LEFT EYE; CANNOT VISUALIZE LEFT FUNDUS  MUSCULOSKELETAL:  Gait, strength, tone, movements noted in Neurologic exam below  NEUROLOGIC: MENTAL STATUS:  No flowsheet data found.  awake, alert, oriented to person, place and time  recent and remote memory intact  normal attention and concentration  language fluent, comprehension intact, naming intact,   fund of knowledge appropriate  CRANIAL NERVE:   2nd - no papilledema on fundoscopic exam ON RIGHT; CANNOT VISUALIZE ON LEFT  2nd, 3rd, 4th, 6th - pupils equal and reactive to light, visual fields full to confrontation, extraocular muscles intact, no nystagmus  5th - facial sensation symmetric  7th - facial strength symmetric  8th - hearing intact  9th - palate elevates symmetrically, uvula midline  11th - shoulder shrug symmetric  12th - tongue protrusion midline  MOTOR:   normal bulk and tone, full strength in the RUE, RLE  LUE --> WEAKNESS OF LEFT HAND INTRINSIC MUSCLES AND LEFT DELTOID  LLE --> HF 4; OTHERWISE 5  SENSORY:   normal and symmetric to light touch, temperature, vibration  EXCEPT SLIGHT HYPERSENS TO TEMP IN LEFT HAND  COORDINATION:   finger-nose-finger, fine finger movements normal  REFLEXES:   deep tendon reflexes TRACE and symmetric  GAIT/STATION:   WIDE BASED, ATAXIC GAIT    DIAGNOSTIC DATA (LABS, IMAGING, TESTING) - I reviewed patient records, labs, notes, testing and imaging myself where available.  Lab Results  Component Value Date   WBC 10.6 (H) 11/14/2016   HGB  10.9 (L) 11/14/2016   HCT 32.0 (L) 11/14/2016   MCV 89.3 11/14/2016   PLT 342 11/14/2016      Component Value Date/Time   NA 134 (L) 11/16/2016 0603   K 4.2 11/16/2016 0603   CL 100 (L) 11/16/2016 0603   CO2 25 11/16/2016 0603   GLUCOSE 183 (H) 11/16/2016 0603   BUN 36 (H) 11/16/2016 0603   CREATININE 2.30 (H) 11/16/2016 0603   CALCIUM 9.0 11/16/2016 0603   PROT 7.2 11/14/2016 1852   ALBUMIN 3.3 (L) 11/14/2016 1852   AST 22 11/14/2016 1852   ALT 25 11/14/2016 1852   ALKPHOS 88 11/14/2016 1852   BILITOT 0.7 11/14/2016 1852   GFRNONAA 29 (L) 11/16/2016 0603   GFRAA 33 (L) 11/16/2016 0603   Lab Results  Component Value Date   CHOL 149 11/03/2016   HDL 37 (L) 11/03/2016   LDLCALC 94 11/03/2016   TRIG 91 11/03/2016   CHOLHDL 4.0 11/03/2016   Lab Results  Component Value Date   HGBA1C 8.6 (H) 11/03/2016   No results found for: [id number] Lab Results  Component Value Date   TSH 9.461 (H) 03/29/2016    11/02/16 CT head - Concern for LEFT cerebellar infarction.  Recommend brain MRI.  11/03/16 MRI brain [I reviewed images myself and agree with interpretation. -VRP]  1. Patchy multifocal acute ischemic infarcts involving the superior left cerebellar hemisphere and left midbrain/pons. No associated hemorrhage or mass effect. 2. Scattered remote lacunar infarcts involving the bilateral basal ganglia/corona radiata as well as the left thalamus. 3. 3 mm pituitary lesion, indeterminate. Finding could be further assessed with nonemergent pituitary mass protocol MRI.  11/03/16 MRA head [I reviewed images myself and agree with interpretation. -VRP]  1. Negative MRA for large or proximal arterial branch occlusion. 2. Severe proximal left SCA stenosis with attenuated flow distally. No other high-grade or correctable stenosis identified. 3. Additional moderate multifocal atheromatous irregularity involving the anterior and posterior circulation as above.  11/15/16 MRI brain  (without) [I reviewed images myself and agree with interpretation. -VRP]  1. Normal expected interval evolution of patchy left pontine and left cerebellar infarcts. 2. Otherwise stable appearance of the brain. Stable chronic microvascular ischemic disease. No other acute intracranial infarct or other process identified. 3. 3 mm pituitary  lesion, indeterminate. Again, this could be further assessed with dedicated nonemergent pituitary mass protocol MRI as desired.  11/09/16 renal u/s 1. No obvious renal artery stenosis identified on this exam.    Somewhat liminted exam due to bowel and gas. Possible IMA    stenosis, with peak velocities aproximately 597cm/s.  11/04/16 carotid u/s - Findings are consistent with a 1-39 percent stenosis involving the right internal carotid artery and the left internal carotid artery. The vertebral arteries demonstrate antegrade flow.  11/03/16 TTE - Normal LV systolic function; mild diastolic dysfunction; severe   LVH with proximal septal thickening; no LVOT gradient at rest; no   SAM; mild LAE.  10/19/16 BLE arterial u/s - worsening peripheral arterial disease, now moderate disease bilaterally   12/12/16 MRI brain and pituitary protocol  1. No pituitary mass 2. Multiple old lacunar infarcts 3. Possible small acute lacunar infarct in the left side of the pons in the periaqueductal region.     ASSESSMENT AND PLAN  61 y.o. 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 Hypertensive urgency   3. Type 2 diabetes mellitus with diabetic nephropathy, with long-term current use of insulin (HCC)      PLAN:  SECONDARY STROKE PREVENTION (new problem, no additional workup) - continue aspirin 325 + plavix 75 x 3 months post stroke; then reduce to plavix 75mg  daily alone  starting Sept 1, 2018 - needs better BP control --> follow up with PCP and nephrology - continue statin - continue insulin and glipizide - continue PT/OT rehab  ? PITUITARY LESION - not seen on recent pituitary protocol MRI - no further follow up needed for pituitary  Return if symptoms worsen or fail to improve, for return to PCP.    Penni Bombard, MD 9/89/2119, 4:17 AM Certified in Neurology, Neurophysiology and Neuroimaging  [facility] [address] [place], [place] [address] [phone]

## 2016-12-28 NOTE — Therapy (Signed)
[facility] [address] [place], [place], [address] Phone: [phone]   Fax:  [phone]  Patient Details  Name: John Jacobson MRN: [id number] Date of Birth: 1956/04/12 Referring Provider:  Charlyne Petrin, MD  Encounter Date: 12/28/2016   The patient arrived today reporting he was seen this morning at [facility].  PT checked encounter to review note and saw BP was elevated at that appt.    Patient notes he did not go home to take his BP meds.    PT checked and BP=206/102.  Recommended patient leave clinic and go take his BP medications as directed by prescription.  We discussed that exercise not indicated when BP elevated at this level.   No charge for visit.   [place], PT 12/28/2016, 12:52 PM  [facility] [address] [place], [place], [address] Phone: [phone]   Fax:  [phone]

## 2016-12-28 NOTE — Patient Instructions (Signed)
Thank you for coming to see [place] at [facility]. I hope we have been able to provide you high quality care today.  You may receive a patient satisfaction survey over the next few weeks. We would appreciate your feedback and comments so that we may continue to improve ourselves and the health of our patients.   SECONDARY STROKE PREVENTION - continue aspirin 325 + plavix 75 for 3 months after stroke; then reduce to plavix 73m daily alone starting Sept 1, 2018 - needs better BP control --> follow up with PCP and nephrology - continue statin - continue insulin and glipizide - continue PT/OT rehab  ? PITUITARY LESION - not seen on recent pituitary protocol MRI - no further follow up needed for pituitary   ~~~~~~~~~~~~~~~~~~~~~~~~~~~~~~~~~~~~~~~~~~~~~~~~~~~~~~~~~~~~~~~~~  DR. Christol Thetford'S GUIDE TO HAPPY AND HEALTHY LIVING These are some of my general health and wellness recommendations. Some of them may apply to you better than others. Please use common sense as you try these suggestions and feel free to ask me any questions.   ACTIVITY/FITNESS Mental, social, emotional and physical stimulation are very important for brain and body health. Try learning a new activity (arts, music, language, sports, games).  Keep moving your body to the best of your abilities. You can do this at home, inside or outside, the park, community center, gym or anywhere you like. Consider a physical therapist or personal trainer to get started. Consider the app Sworkit. Fitness trackers such as smart-watches, smart-phones or Fitbits can help as well.   NUTRITION Eat more plants: colorful vegetables, nuts, seeds and berries.  Eat less sugar, salt, preservatives and processed foods.  Avoid toxins such as cigarettes and alcohol.  Drink water when you are thirsty. Warm water with a slice of lemon is an excellent morning drink to start the day.  Consider these websites for more information The  Nutrition Source (hhttps://www.henry-hernandez.biz/ Precision Nutrition (wWindowBlog.ch   RELAXATION Consider practicing mindfulness meditation or other relaxation techniques such as deep breathing, prayer, yoga, tai chi, massage. See website mindful.org or the apps Headspace or Calm to help get started.   SLEEP Try to get at least 7-8+ hours sleep per day. Regular exercise and reduced caffeine will help you sleep better. Practice good sleep hygeine techniques. See website sleep.org for more information.   PLANNING Prepare estate planning, living will, healthcare POA documents. Sometimes this is best planned with the help of an attorney. Theconversationproject.org and agingwithdignity.org are excellent resources.

## 2016-12-30 ENCOUNTER — Ambulatory Visit: Payer: Medicaid Other | Admitting: Rehabilitative and Restorative Service Providers"

## 2016-12-30 ENCOUNTER — Encounter: Payer: Medicaid Other | Admitting: Occupational Therapy

## 2016-12-30 VITALS — BP 210/102

## 2016-12-30 DIAGNOSIS — R2689 Other abnormalities of gait and mobility: Secondary | ICD-10-CM

## 2016-12-30 DIAGNOSIS — M6281 Muscle weakness (generalized): Secondary | ICD-10-CM

## 2016-12-30 DIAGNOSIS — R2681 Unsteadiness on feet: Secondary | ICD-10-CM

## 2016-12-30 NOTE — Therapy (Signed)
[facility] [address] [place], [place], [address] Phone: [phone]   Fax:  [phone]  Patient Details  Name: John Jacobson MRN: [id number] Date of Birth: 07-10-1955 Referring Provider:  Charlyne Petrin, MD  Encounter Date: 12/30/2016   No charge--the patient's BP was elevated today  Vitals:   12/30/16 1414 12/30/16 1416  BP: (!) 215/95 (!) 210/102      PT did not treat and we discussed cancelling one visit next week until after f/u with MD.  Recommended he monitor BP at home.  [place], [place] 12/30/2016, 2:50 PM  [facility] [address] [place], [place], [address] Phone: [phone]   Fax:  [phone]

## 2017-01-03 ENCOUNTER — Ambulatory Visit (HOSPITAL_BASED_OUTPATIENT_CLINIC_OR_DEPARTMENT_OTHER): Payer: Medicaid Other | Admitting: Physical Medicine & Rehabilitation

## 2017-01-03 ENCOUNTER — Encounter: Payer: Self-pay | Admitting: Physical Medicine & Rehabilitation

## 2017-01-03 ENCOUNTER — Encounter: Payer: Medicaid Other | Attending: Physical Medicine & Rehabilitation

## 2017-01-03 VITALS — BP 195/95 | HR 48

## 2017-01-03 DIAGNOSIS — J45909 Unspecified asthma, uncomplicated: Secondary | ICD-10-CM | POA: Diagnosis not present

## 2017-01-03 DIAGNOSIS — Z9889 Other specified postprocedural states: Secondary | ICD-10-CM | POA: Diagnosis not present

## 2017-01-03 DIAGNOSIS — Z89429 Acquired absence of other toe(s), unspecified side: Secondary | ICD-10-CM | POA: Insufficient documentation

## 2017-01-03 DIAGNOSIS — G464 Cerebellar stroke syndrome: Secondary | ICD-10-CM | POA: Diagnosis present

## 2017-01-03 DIAGNOSIS — M25511 Pain in right shoulder: Secondary | ICD-10-CM | POA: Insufficient documentation

## 2017-01-03 DIAGNOSIS — R269 Unspecified abnormalities of gait and mobility: Secondary | ICD-10-CM | POA: Diagnosis not present

## 2017-01-03 DIAGNOSIS — I10 Essential (primary) hypertension: Secondary | ICD-10-CM | POA: Diagnosis not present

## 2017-01-03 DIAGNOSIS — Z87891 Personal history of nicotine dependence: Secondary | ICD-10-CM | POA: Diagnosis not present

## 2017-01-03 DIAGNOSIS — E119 Type 2 diabetes mellitus without complications: Secondary | ICD-10-CM | POA: Insufficient documentation

## 2017-01-03 DIAGNOSIS — H538 Other visual disturbances: Secondary | ICD-10-CM | POA: Insufficient documentation

## 2017-01-03 DIAGNOSIS — I69398 Other sequelae of cerebral infarction: Secondary | ICD-10-CM

## 2017-01-03 MED ORDER — TRAMADOL HCL 50 MG PO TABS: 50.0000 mg | ORAL_TABLET | Freq: Two times a day (BID) | ORAL | 0 refills | Status: DC

## 2017-01-03 NOTE — Patient Instructions (Addendum)
Please follow up with ophthalmology to evaluate blurring vision in the left eye. Follow-up with primary care physician in terms of your elevated blood pressure.  Try heating pad as well as icy hot or similar muscle rub to the right trapezius muscle Physical therapy can try dry needling.

## 2017-01-03 NOTE — Progress Notes (Signed)
Subjective:    Patient ID: John Jacobson, male    DOB: June 21, 1955, 61 y.o.   MRN: [id number]  John Jacobson readmit 11/14/2016 for orthostasis and lasix was d/ced Received OP PT, OT which is ongoing Had elevated BP at therapy, 167/83 this am but 195/95 this pm .  Pt states he is taking BP meds. Patient has not followed up with his primary care physician Having blurred vision in left eye, plans to f/u at [facility] Neuro note 12/28/16 cont ASA /Plavix  Modified independent dressing, bathing, cooking. Ambulates with cane. Pain Inventory Average Pain 5 Pain Right Now 6 My pain is dull and aching  In the last 24 hours, has pain interfered with the following? General activity 6 Relation with others 6 Enjoyment of life 6 What TIME of day is your pain at its worst? evening Sleep (in general) Fair  Pain is worse with: walking, bending, sitting, standing and some activites Pain improves with: medication Relief from Meds: .  Mobility walk with assistance use a cane ability to climb steps?  yes do you drive?  no  Function retired  Neuro/Psych weakness numbness  Prior Studies Any changes since last visit?  no  Physicians involved in your care Any changes since last visit?  no   Family History  Problem Relation Age of Onset  . Coronary artery disease Mother        MI in her 74s  . Hypertension Unknown   . Diabetes Unknown   . Alzheimer's disease Unknown   . Coronary artery disease Brother    Social History   Social History  . Marital status: Single    Spouse name: N/A  . Number of children: 3  . Years of education: N/A   Social History Main Topics  . Smoking status: Former Research scientist (life sciences)  . Smokeless tobacco: Never Used  . Alcohol use No     Comment: Few beers every other day hx  . Drug use: No  . Sexual activity: Not Currently   Other Topics Concern  . None   Social History Narrative   Lives a home with brother.  College grad.  Children - 3    Caffeine 2-3 cups coffee / wk   Past Surgical History:  Procedure Laterality Date  . BACK SURGERY  1995  . TOE AMPUTATION  2016   Past Medical History:  Diagnosis Date  . Asthma   . DM (diabetes mellitus) ([place])   . HTN (hypertension)   . RBBB (right bundle branch block)    There were no vitals taken for this visit.  Opioid Risk Score:   Fall Risk Score:  `1  Depression screen PHQ 2/9  No flowsheet data found.   Review of Systems  Constitutional: Positive for chills and unexpected weight change.  Eyes: Negative.   Respiratory: Positive for shortness of breath.   Cardiovascular: Negative.   Gastrointestinal: Positive for constipation.  Endocrine: Negative.   Genitourinary: Negative.   Musculoskeletal: Negative.   Skin: Negative.   Allergic/Immunologic: Negative.   Neurological: Negative.   Hematological: Negative.   Psychiatric/Behavioral: Negative.   All other systems reviewed and are negative.      Objective:   Physical Exam  Constitutional: He is oriented to person, place, and time. He appears well-developed and well-nourished.  HENT:  Head: Normocephalic and atraumatic.  Eyes: Pupils are equal, round, and reactive to light. Conjunctivae and EOM are normal.  Neck: Normal range of motion.  Cardiovascular: Normal rate, regular  rhythm and normal heart sounds.  Exam reveals no friction rub.   No murmur heard. Pulmonary/Chest: Effort normal and breath sounds normal. No respiratory distress.  Abdominal: Soft. Bowel sounds are normal. He exhibits no distension. There is no tenderness.  Neurological: He is alert and oriented to person, place, and time. No sensory deficit. He exhibits normal muscle tone. Coordination and gait abnormal.  Motor strength is 5/5 bilateral deltoid, bicep, triceps, grip, hip flexion, knee extensors, ankle or flexor. Sensation intact to light touch bilateral upper and lower limbs Gait is without evidence of toe drag or knee instability  wide-base of support. Ambulates without a cane with supervision. Ambulates modified independent with a cane.  Extraocular muscles intact, no diplopia on lateral gaze. No evidence of nystagmus. Visual fields are intact confrontation testing  Skin: Skin is warm and dry.  Psychiatric: He has a normal mood and affect.  Nursing note and vitals reviewed.    Dysmetria, left heel-to-shin. No dysmetria, finger-nose-finger     Assessment & Plan:  1. Left cerebellar infarct. He has residual left lower extremity and upper extremity ataxia, as well as mild truncal ataxia. He has achieved modified independent level. As discussed with the patient do not think he will need a prolonged period of outpatient therapy. His focus should be on his mobility.  Good prognosis for return to work.  2. Blurring of vision, left eye. I do not think this represents diplopia from a cranial nerve deficit. he plans to follow-up with ophthalmology, which I think is appropriate. Would not return to driving unless cleared by ophthalmology  3. Right shoulder pain mainly with therapy. He has tenderness along the upper trapezius due to substitution pattern. Would recommend counter irritant cream plus heat. Physical therapy may try some dry needling to the right upper trapezius.  Have given patient, 30 tablets of tramadol, which can be used prior to therapy. Will not refill unless the patient is seen here on a regular basis  Patient will follow up with the John Jacobson, I will see him on a when necessary basis. If shoulder pain persists, may consider trigger point injection.

## 2017-01-04 ENCOUNTER — Encounter: Payer: Medicaid Other | Admitting: Rehabilitative and Restorative Service Providers"

## 2017-01-06 ENCOUNTER — Ambulatory Visit: Payer: Medicaid Other | Admitting: Rehabilitative and Restorative Service Providers"

## 2017-01-10 ENCOUNTER — Encounter: Payer: Medicaid Other | Admitting: Occupational Therapy

## 2017-01-10 ENCOUNTER — Encounter: Payer: Medicaid Other | Admitting: Physical Therapy

## 2017-01-11 ENCOUNTER — Encounter: Payer: Medicaid Other | Admitting: Rehabilitative and Restorative Service Providers"

## 2017-01-11 ENCOUNTER — Encounter: Payer: Medicaid Other | Admitting: Occupational Therapy

## 2017-01-16 ENCOUNTER — Ambulatory Visit: Payer: Self-pay | Admitting: Neurology

## 2017-01-16 ENCOUNTER — Encounter: Payer: Medicaid Other | Admitting: Rehabilitative and Restorative Service Providers"

## 2017-01-18 ENCOUNTER — Ambulatory Visit: Payer: Medicaid Other | Attending: Family Medicine | Admitting: Occupational Therapy

## 2017-01-18 ENCOUNTER — Encounter: Payer: Medicaid Other | Admitting: Rehabilitative and Restorative Service Providers"

## 2017-01-20 ENCOUNTER — Encounter: Payer: Self-pay | Admitting: Occupational Therapy

## 2017-01-20 DIAGNOSIS — R2681 Unsteadiness on feet: Secondary | ICD-10-CM

## 2017-01-20 NOTE — Therapy (Signed)
[facility] [address] [place], [place], [address] Phone: [phone]   Fax:  [phone]  Occupational Therapy Treatment  Patient Details  Name: John Jacobson MRN: [id number] Date of Birth: [birth year] Referring Provider: Dr. Letta Pate  Encounter Date: 01/20/2017    Past Medical History:  Diagnosis Date  . Asthma   . DM (diabetes mellitus) ([place])   . HTN (hypertension)   . RBBB (right bundle branch block)     Past Surgical History:  Procedure Laterality Date  . BACK SURGERY  1995  . TOE AMPUTATION  2016    There were no vitals filed for this visit.                                 OT Long Term Goals - 01/20/17 1657      OT LONG TERM GOAL #1   Title Pt will be mod I with home activities program - 01/23/2017 (date adjusted to allow time for medicaid approval)   Baseline dependent   Status Unable to assess     OT LONG TERM GOAL #2   Title Pt will demonstrate improved grip strength by at least 5 pounds with L hand to assist with functional tasks.    Baseline L grip = 70 pounds R grip = 100   Status Unable to assess     OT LONG TERM GOAL #3   Title Pt will rate bilateral shoulder pain no more than 2/10 with reaching activities such as putting away dishes or laundry in closet.    Baseline 4/10   Status Unable to assess     OT LONG TERM GOAL #4   Title Pt will report intensity of disequilibrium no greater than 3/10 and with duration no greater than 8 seconds to reduce fall risk with home mgmt tasks.    Baseline currently in sitting 4/10 with duration of 15 seconds. Rating is higher in standing or with functional ambulation.    Status Unable to assess     OT LONG TERM GOAL #6   Title Pt will verbalize understanding of return to driving recommendations for safety   Baseline unable at this time   Status New     OT LONG TERM GOAL #7   Title Pt will be able to type at three  sentence level at computer with at least 90% accuracy as precursor to possible return to work.    Baseline unable to do at this time   Status New               Plan - 01/20/17 1657    Clinical Impression Statement Pt has had numerous medical issues and has not returned for therapy. Pt called today and cancelled all remaining appts and stated he was having surgery.    Plan d/c from OT at pt's request due to medical issues   Consulted and Agree with Plan of Care Patient      Patient will benefit from skilled therapeutic intervention in order to improve the following deficits and impairments:     Visit Diagnosis: Unsteadiness on feet    Problem List Patient Active Problem List   Diagnosis Date Noted  . Stroke ([place]) 11/15/2016  . History of stroke   . Labile blood glucose   . Hepatitis C virus infection without hepatic coma   . AKI (acute kidney injury) ([place])   . Brainstem infarct, acute ([place])   .  Benign essential HTN   . Hypokalemia   . Leukocytosis   . Acute blood loss anemia   . 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 Hyperlipidemia   . Gait disturbance, post-stroke   . CKD (chronic kidney disease), stage III 11/02/2016  . Normocytic anemia 11/02/2016  . Ataxia 11/02/2016  . Ischemic stroke ([place]) 11/02/2016  . Puncture wound of right foot 11/02/2016  . Chronic diastolic CHF (congestive heart failure) ([place]) 11/02/2016  . Chronic left-sided low back pain 11/02/2016  . Demand ischemia ([place])   . ARF (acute renal failure) ([place])   . Hypertensive urgency 03/29/2016  . Abnormal electrocardiogram 05/24/2011  . Chest pain 05/24/2011  . Tobacco abuse 05/24/2011  . DM (diabetes mellitus), type 2 with renal complications ([facility]) 64/29/0379   OCCUPATIONAL THERAPY DISCHARGE SUMMARY  Visits from Start of Care: 1  Current functional level related to goals / functional  outcomes: See above pt did not return after eval due to medical issues   Remaining deficits: See initial eval   Education / Equipment: n/a Plan: Patient agrees to discharge.  Patient goals were not met. Patient is being discharged due to a change in medical status.  ?????      Quay Burow , OTR/L 01/20/2017, 4:58 PM  [facility] [address] [place], [place], [address] Phone: [phone]   Fax:  [phone]  Name: John Jacobson MRN: [id number] Date of Birth: [birth year]

## 2017-01-23 ENCOUNTER — Encounter: Payer: Medicaid Other | Admitting: Rehabilitative and Restorative Service Providers"

## 2017-01-25 ENCOUNTER — Encounter: Payer: Medicaid Other | Admitting: Rehabilitative and Restorative Service Providers"

## 2017-01-25 HISTORY — PX: PARS PLANA VITRECTOMY: SHX2166

## 2017-01-30 ENCOUNTER — Encounter: Payer: Self-pay | Admitting: Rehabilitative and Restorative Service Providers"

## 2017-01-30 ENCOUNTER — Encounter: Payer: Medicaid Other | Admitting: Rehabilitative and Restorative Service Providers"

## 2017-01-30 NOTE — Therapy (Signed)
[facility] [address] [place], [place], [address] Phone: [phone]   Fax:  [phone]  Patient Details  Name: John Jacobson MRN: [id number] Date of Birth: [birth year]  The patient continues to have elevated BP limiting participation in PT.  He called to cancel remaining PT visits.  See initial evaluation for patient status.   [place] 01/30/2017, 8:25 AM  [facility] [address] [place], [place], [address] Phone: [phone]   Fax:  [phone]

## 2017-02-02 ENCOUNTER — Encounter: Payer: Medicaid Other | Admitting: Physical Therapy

## 2017-02-06 ENCOUNTER — Encounter: Payer: Medicaid Other | Admitting: Rehabilitative and Restorative Service Providers"

## 2017-02-08 ENCOUNTER — Encounter: Payer: Medicaid Other | Admitting: Rehabilitative and Restorative Service Providers"

## 2017-07-07 ENCOUNTER — Other Ambulatory Visit: Payer: Self-pay | Admitting: Physical Medicine & Rehabilitation

## 2017-08-22 IMAGING — MR MR HEAD W/O CM
9 of 10 series · 35 of 48 positions shown · non-contrast
Comparison: Prior CT from 11/14/2016 as well as recent MRI from
11/03/2016.

CLINICAL DATA: Initial evaluation for acute dizziness.

EXAM:
MRI HEAD WITHOUT CONTRAST
TECHNIQUE: Multiplanar, multiecho pulse sequences of the brain and surrounding
structures were obtained without intravenous contrast.

[Series 2: FLAIR · sagittal · 5.0mm · 0.47mm/px · 3 of 23 slices shown (1 of 2)]
[im 1/23]
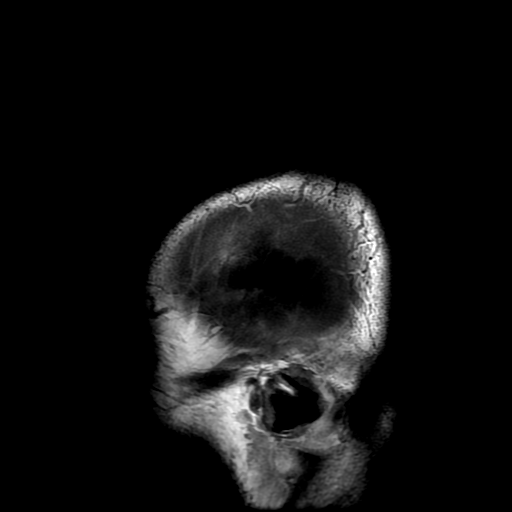
[im 12/23]
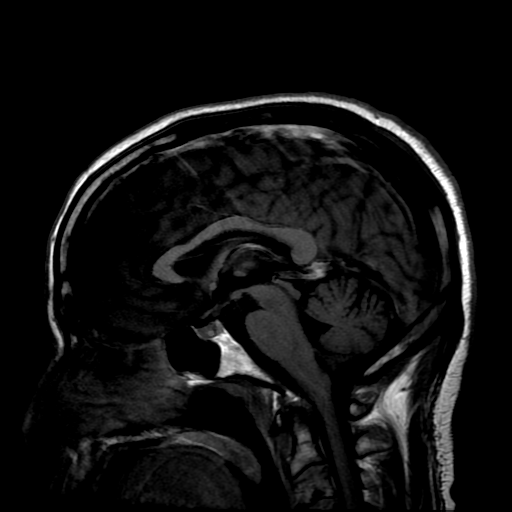
[im 23/23]
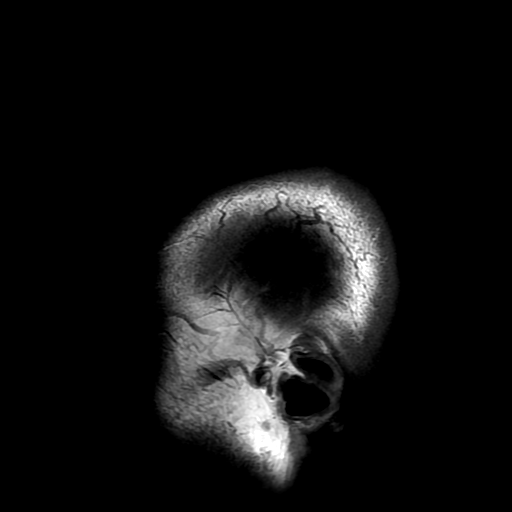

[Series 4: DWI · axial · 3.0mm · 0.94mm/px · z∈[-98,+44]mm · 9 of 100 slices shown (1 of 2)]
[im 1/100]
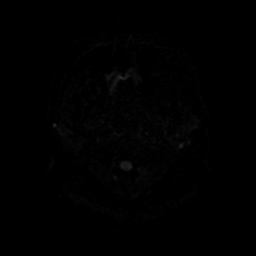
[im 13/100]
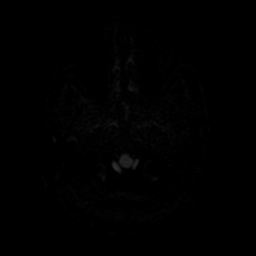
[im 25/100]
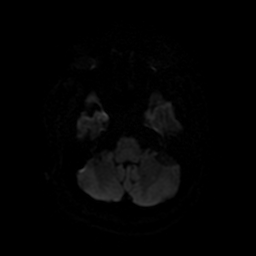
[im 38/100]
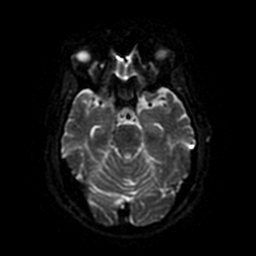
[im 50/100]
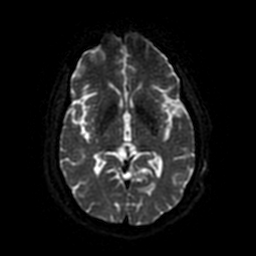
[im 62/100]
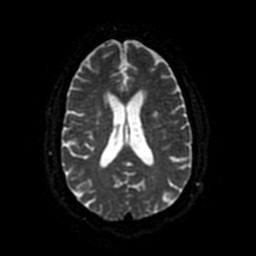
[im 75/100]
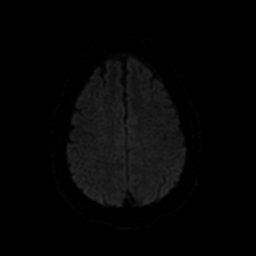
[im 87/100]
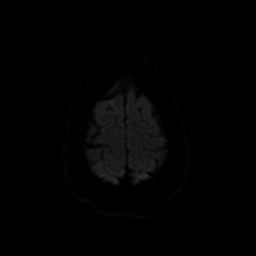
[im 100/100]
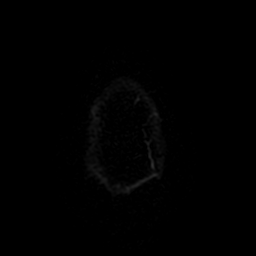

[Series 5: T2 · axial · 5.0mm · 0.43mm/px · z∈[-91,+48]mm · 2 of 25 slices shown (1 of 2)]
[im 1/25]
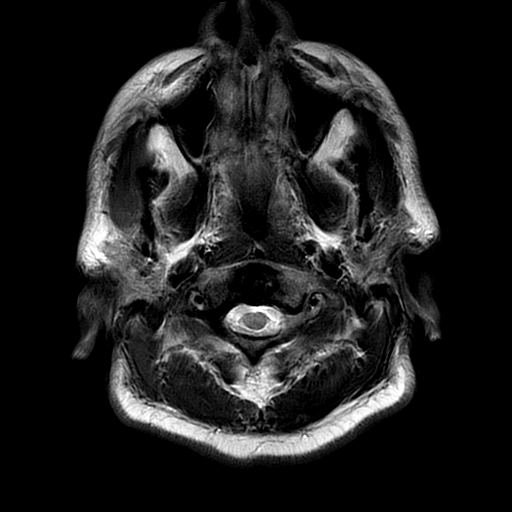
[im 25/25]
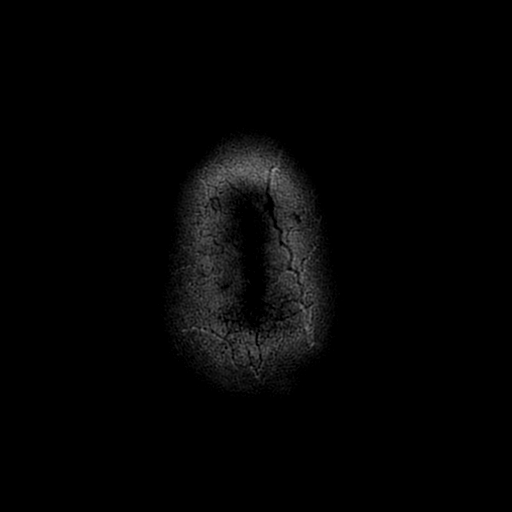

[Series 6: FLAIR · axial · 5.0mm · 0.43mm/px · z∈[-91,+48]mm · 2 of 25 slices shown (2 of 2)]
[im 1/25]
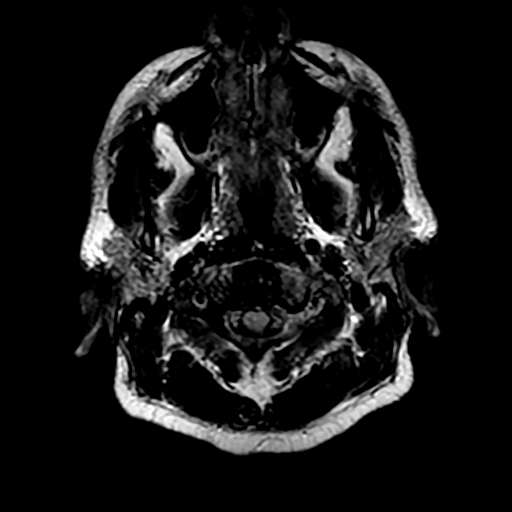
[im 25/25]
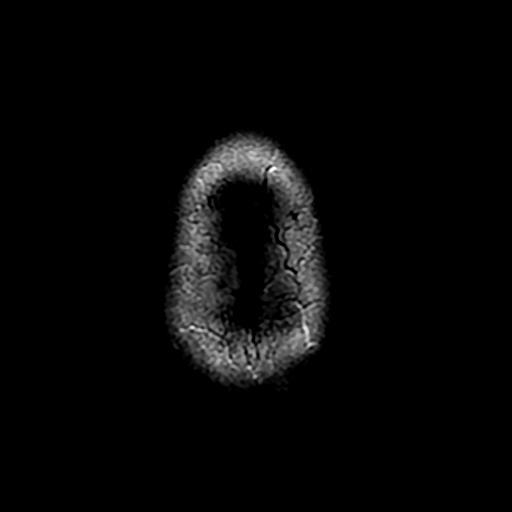

[Series 7: DWI · coronal · 4.0mm · 0.94mm/px · 7 of 72 slices shown (2 of 2)]
[im 1/72]
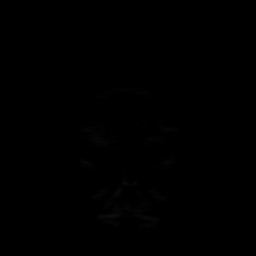
[im 12/72]
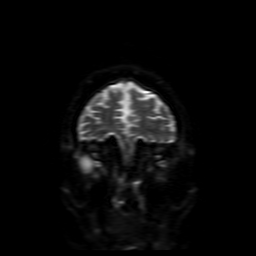
[im 24/72]
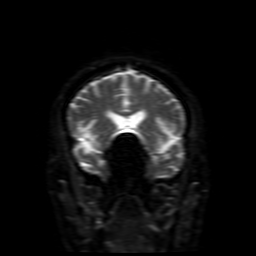
[im 36/72]
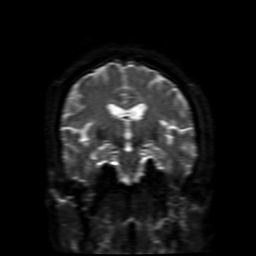
[im 48/72]
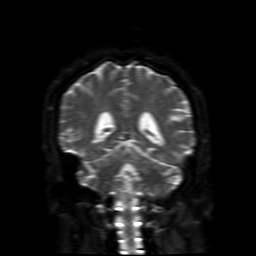
[im 60/72]
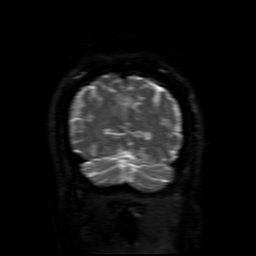
[im 72/72]
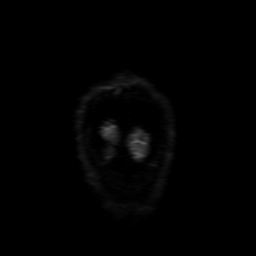

[Series 8: (person_name) · axial · 3.0mm · 0.47mm/px · 1 of 100 slices shown]
[im 1/100]
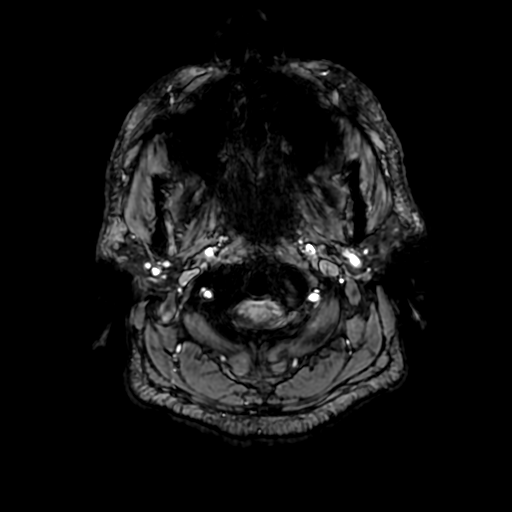

[Series 10: T2 · coronal · 5.0mm · 0.39mm/px · 3 of 30 slices shown (2 of 2)]
[im 1/30]
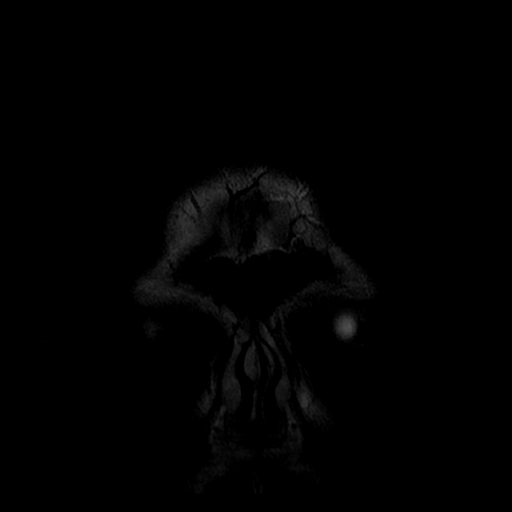
[im 15/30]
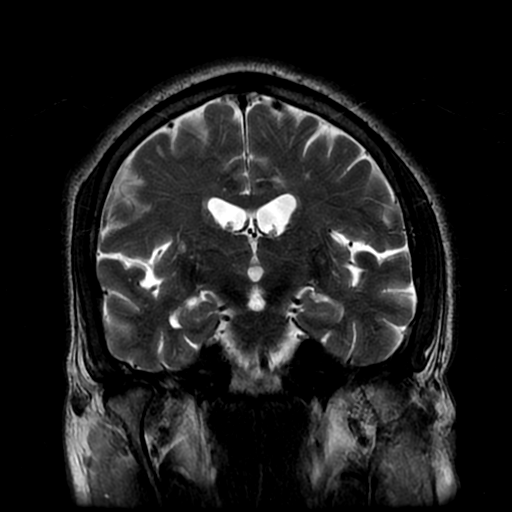
[im 30/30]
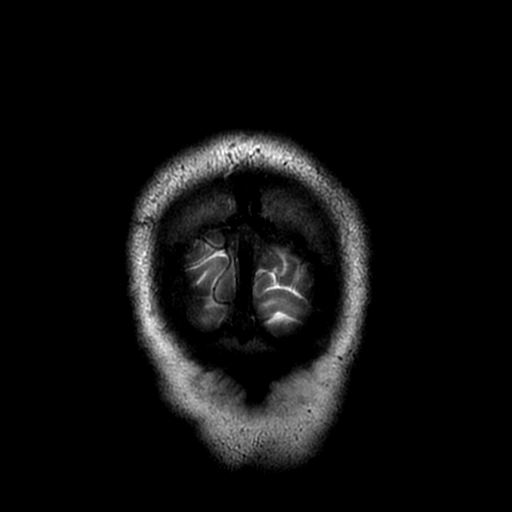

[Series 450: ADC · axial · 3.0mm · 0.94mm/px · z∈[-98,+44]mm · 5 of 49 slices shown (1 of 2)]
[im 1/49]
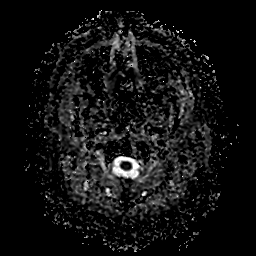
[im 13/49]
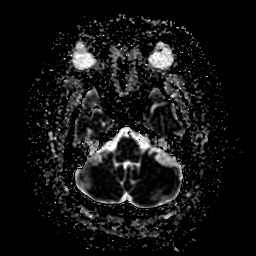
[im 25/49]
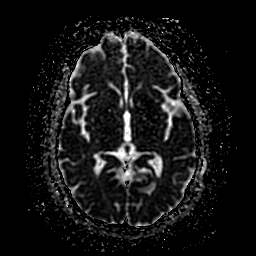
[im 37/49]
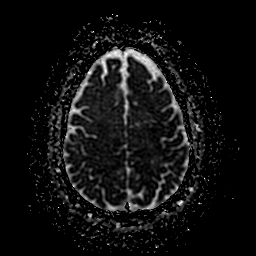
[im 49/49]
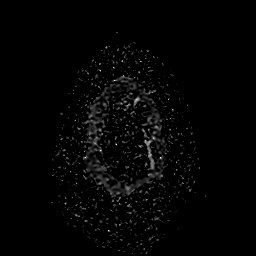

[Series 750: ADC · coronal · 4.0mm · 0.94mm/px · 3 of 36 slices shown (2 of 2)]
[im 1/36]
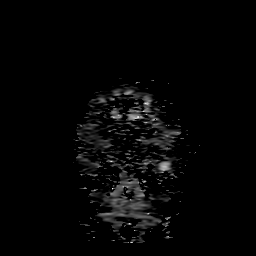
[im 18/36]
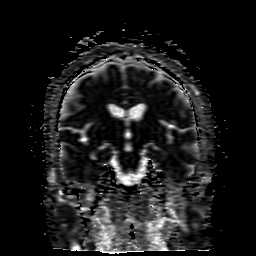
[im 36/36]
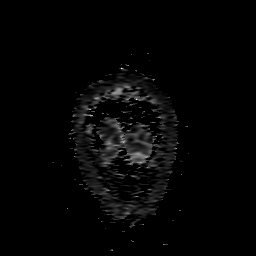

[35 of 48 positions shown; findings below may reference images not displayed]

FINDINGS: Brain: Stable atrophy with chronic small vessel ischemic disease.
Scatter remote lacunar infarct present within the bilateral basal
ganglia/ corona radiata. Remote lacunar infarct present at the left
thalamus.

Normal expected interval evolution seen about the recently
identified left cerebellar and left pontine infarcts. Small amount
of associated petechial hemorrhage at the left cerebellar infarcts
(series 8, image 36). No significant mass effect.

No other abnormal foci of restricted diffusion to suggest acute
ischemic infarct. Gray-white matter differentiation maintained.
Additional small subcentimeter chronic microhemorrhage noted within
the left periventricular white matter (series 8, image 71). No
evidence for acute intracranial hemorrhage.

No mass lesion, midline shift or mass effect. No hydrocephalus. No
extra-axial fluid collection. Major dural sinuses are grossly
patent.

Tiny 3 mm T1 hyperintense lesion again noted within the posterior
pituitary gland, indeterminate. Suprasellar region unremarkable.
Midline structures intact and normal.

Vascular: Major intracranial vascular flow voids are maintained.

Skull and upper cervical spine: Craniocervical junction within
normal limits. Multilevel degenerative spondylolysis noted within
the upper cervical spine without significant stenosis. Bone marrow
signal intensity within normal limits. No scalp soft tissue
abnormality.

Sinuses/Orbits: Globes and orbital soft tissues within normal
limits. Paranasal sinuses are grossly clear. No mastoid effusion.
Inner ear structures normal.

Other: None.
IMPRESSION: 1. Normal expected interval evolution of patchy left pontine and
left cerebellar infarcts.
2. Otherwise stable appearance of the brain. Stable chronic
microvascular ischemic disease. No other acute intracranial infarct
or other process identified.
3. 3 mm pituitary lesion, indeterminate. Again, this could be
further assessed with dedicated nonemergent pituitary mass protocol
MRI as desired.

## 2018-02-07 ENCOUNTER — Encounter (HOSPITAL_COMMUNITY): Payer: Self-pay | Admitting: Emergency Medicine

## 2018-02-07 ENCOUNTER — Emergency Department (HOSPITAL_BASED_OUTPATIENT_CLINIC_OR_DEPARTMENT_OTHER): Payer: Medicaid Other

## 2018-02-07 ENCOUNTER — Other Ambulatory Visit: Payer: Self-pay

## 2018-02-07 ENCOUNTER — Inpatient Hospital Stay (HOSPITAL_COMMUNITY)
Admission: EM | Admit: 2018-02-07 | Discharge: 2018-02-12 | DRG: 291 | Disposition: A | Discharge status: Home / Self Care | Payer: Medicaid Other | Attending: Internal Medicine | Admitting: Internal Medicine

## 2018-02-07 ENCOUNTER — Emergency Department (HOSPITAL_COMMUNITY): Payer: Medicaid Other

## 2018-02-07 DIAGNOSIS — R0602 Shortness of breath: Secondary | ICD-10-CM

## 2018-02-07 DIAGNOSIS — M7989 Other specified soft tissue disorders: Secondary | ICD-10-CM | POA: Diagnosis not present

## 2018-02-07 DIAGNOSIS — R079 Chest pain, unspecified: Secondary | ICD-10-CM | POA: Diagnosis not present

## 2018-02-07 DIAGNOSIS — I5031 Acute diastolic (congestive) heart failure: Secondary | ICD-10-CM | POA: Diagnosis present

## 2018-02-07 DIAGNOSIS — I130 Hypertensive heart and chronic kidney disease with heart failure and stage 1 through stage 4 chronic kidney disease, or unspecified chronic kidney disease: Secondary | ICD-10-CM | POA: Diagnosis present

## 2018-02-07 DIAGNOSIS — I160 Hypertensive urgency: Secondary | ICD-10-CM

## 2018-02-07 DIAGNOSIS — I10 Essential (primary) hypertension: Secondary | ICD-10-CM

## 2018-02-07 DIAGNOSIS — Z8249 Family history of ischemic heart disease and other diseases of the circulatory system: Secondary | ICD-10-CM

## 2018-02-07 DIAGNOSIS — Z7982 Long term (current) use of aspirin: Secondary | ICD-10-CM

## 2018-02-07 DIAGNOSIS — N179 Acute kidney failure, unspecified: Secondary | ICD-10-CM

## 2018-02-07 DIAGNOSIS — E785 Hyperlipidemia, unspecified: Secondary | ICD-10-CM | POA: Diagnosis present

## 2018-02-07 DIAGNOSIS — E1165 Type 2 diabetes mellitus with hyperglycemia: Secondary | ICD-10-CM | POA: Diagnosis not present

## 2018-02-07 DIAGNOSIS — I4510 Unspecified right bundle-branch block: Secondary | ICD-10-CM | POA: Diagnosis present

## 2018-02-07 DIAGNOSIS — I509 Heart failure, unspecified: Secondary | ICD-10-CM

## 2018-02-07 DIAGNOSIS — I169 Hypertensive crisis, unspecified: Secondary | ICD-10-CM

## 2018-02-07 DIAGNOSIS — Z888 Allergy status to other drugs, medicaments and biological substances status: Secondary | ICD-10-CM

## 2018-02-07 DIAGNOSIS — E1122 Type 2 diabetes mellitus with diabetic chronic kidney disease: Secondary | ICD-10-CM | POA: Diagnosis present

## 2018-02-07 DIAGNOSIS — E1121 Type 2 diabetes mellitus with diabetic nephropathy: Secondary | ICD-10-CM

## 2018-02-07 DIAGNOSIS — E113592 Type 2 diabetes mellitus with proliferative diabetic retinopathy without macular edema, left eye: Secondary | ICD-10-CM | POA: Diagnosis present

## 2018-02-07 DIAGNOSIS — G8929 Other chronic pain: Secondary | ICD-10-CM | POA: Diagnosis present

## 2018-02-07 DIAGNOSIS — J45909 Unspecified asthma, uncomplicated: Secondary | ICD-10-CM | POA: Diagnosis present

## 2018-02-07 DIAGNOSIS — Z794 Long term (current) use of insulin: Secondary | ICD-10-CM

## 2018-02-07 DIAGNOSIS — I132 Hypertensive heart and chronic kidney disease with heart failure and with stage 5 chronic kidney disease, or end stage renal disease: Principal | ICD-10-CM | POA: Diagnosis present

## 2018-02-07 DIAGNOSIS — Z91013 Allergy to seafood: Secondary | ICD-10-CM

## 2018-02-07 DIAGNOSIS — D631 Anemia in chronic kidney disease: Secondary | ICD-10-CM | POA: Diagnosis present

## 2018-02-07 DIAGNOSIS — Z89429 Acquired absence of other toe(s), unspecified side: Secondary | ICD-10-CM

## 2018-02-07 DIAGNOSIS — Z79899 Other long term (current) drug therapy: Secondary | ICD-10-CM

## 2018-02-07 DIAGNOSIS — Z7902 Long term (current) use of antithrombotics/antiplatelets: Secondary | ICD-10-CM

## 2018-02-07 DIAGNOSIS — I5033 Acute on chronic diastolic (congestive) heart failure: Secondary | ICD-10-CM | POA: Diagnosis present

## 2018-02-07 DIAGNOSIS — N183 Chronic kidney disease, stage 3 unspecified: Secondary | ICD-10-CM | POA: Diagnosis present

## 2018-02-07 DIAGNOSIS — Z8673 Personal history of transient ischemic attack (TIA), and cerebral infarction without residual deficits: Secondary | ICD-10-CM

## 2018-02-07 DIAGNOSIS — F17211 Nicotine dependence, cigarettes, in remission: Secondary | ICD-10-CM | POA: Diagnosis present

## 2018-02-07 DIAGNOSIS — N185 Chronic kidney disease, stage 5: Secondary | ICD-10-CM | POA: Diagnosis present

## 2018-02-07 DIAGNOSIS — E1129 Type 2 diabetes mellitus with other diabetic kidney complication: Secondary | ICD-10-CM | POA: Diagnosis present

## 2018-02-07 DIAGNOSIS — Z79891 Long term (current) use of opiate analgesic: Secondary | ICD-10-CM

## 2018-02-07 DIAGNOSIS — Z86010 Personal history of colonic polyps: Secondary | ICD-10-CM

## 2018-02-07 DIAGNOSIS — I2699 Other pulmonary embolism without acute cor pulmonale: Secondary | ICD-10-CM

## 2018-02-07 DIAGNOSIS — M545 Low back pain: Secondary | ICD-10-CM | POA: Diagnosis present

## 2018-02-07 LAB — I-STAT TROPONIN, ED: Troponin i, poc: 0.02 ng/mL (ref 0.00–0.08)

## 2018-02-07 LAB — CBC
HEMATOCRIT: 34.3 % — AB (ref 39.0–52.0)
HEMOGLOBIN: 11.1 g/dL — AB (ref 13.0–17.0)
MCH: 31.6 pg (ref 26.0–34.0)
MCHC: 32.4 g/dL (ref 30.0–36.0)
MCV: 97.7 fL (ref 78.0–100.0)
Platelets: 185 10*3/uL (ref 150–400)
RBC: 3.51 MIL/uL — AB (ref 4.22–5.81)
RDW: 14.3 % (ref 11.5–15.5)
WBC: 11.9 10*3/uL — ABNORMAL HIGH (ref 4.0–10.5)

## 2018-02-07 LAB — BASIC METABOLIC PANEL
ANION GAP: 10 (ref 5–15)
BUN: 39 mg/dL — ABNORMAL HIGH (ref 8–23)
CO2: 23 mmol/L (ref 22–32)
Calcium: 9.4 mg/dL (ref 8.9–10.3)
Chloride: 108 mmol/L (ref 98–111)
Creatinine, Ser: 3.35 mg/dL — ABNORMAL HIGH (ref 0.61–1.24)
GFR calc Af Amer: 21 mL/min — ABNORMAL LOW (ref >60)
GFR calc non Af Amer: 18 mL/min — ABNORMAL LOW (ref >60)
GLUCOSE: 199 mg/dL — AB (ref 70–99)
POTASSIUM: 3.7 mmol/L (ref 3.5–5.1)
Sodium: 141 mmol/L (ref 135–145)

## 2018-02-07 LAB — GLUCOSE, CAPILLARY: Glucose-Capillary: 189 mg/dL — ABNORMAL HIGH (ref 70–99)

## 2018-02-07 MED ORDER — CLOPIDOGREL BISULFATE 75 MG PO TABS: 75.0000 mg | ORAL_TABLET | Freq: Every day | ORAL | Status: DC

## 2018-02-07 MED ORDER — SODIUM CHLORIDE 0.9% FLUSH
3.0000 mL | Freq: Two times a day (BID) | INTRAVENOUS | Status: DC
  Administered 2018-02-07 – 2018-02-09 (×5): 3 mL via INTRAVENOUS

## 2018-02-07 MED ORDER — INSULIN ASPART 100 UNIT/ML ~~LOC~~ SOLN
0.0000 [IU] | Freq: Every day | SUBCUTANEOUS | Status: DC
  Administered 2018-02-08 – 2018-02-10 (×3): 3 [IU] via SUBCUTANEOUS
  Administered 2018-02-11: 2 [IU] via SUBCUTANEOUS

## 2018-02-07 MED ORDER — DOXAZOSIN MESYLATE 2 MG PO TABS: 2.0000 mg | ORAL_TABLET | Freq: Two times a day (BID) | ORAL | Status: DC

## 2018-02-07 MED ORDER — ONDANSETRON HCL 4 MG/2ML IJ SOLN: 4.0000 mg | Freq: Four times a day (QID) | INTRAMUSCULAR | Status: DC | PRN

## 2018-02-07 MED ORDER — CLONIDINE HCL 0.2 MG PO TABS
0.3000 mg | ORAL_TABLET | Freq: Three times a day (TID) | ORAL | Status: DC
  Administered 2018-02-08 – 2018-02-10 (×7): 0.3 mg via ORAL
  Filled 2018-02-07 (×7): qty 1

## 2018-02-07 MED ORDER — CARVEDILOL 12.5 MG PO TABS
6.2500 mg | ORAL_TABLET | Freq: Two times a day (BID) | ORAL | Status: DC
  Administered 2018-02-08 – 2018-02-09 (×3): 6.25 mg via ORAL
  Filled 2018-02-07 (×3): qty 1

## 2018-02-07 MED ORDER — NITROGLYCERIN 0.4 MG SL SUBL
0.4000 mg | SUBLINGUAL_TABLET | SUBLINGUAL | Status: AC | PRN
  Administered 2018-02-07 – 2018-02-08 (×3): 0.4 mg via SUBLINGUAL
  Filled 2018-02-07 (×2): qty 1

## 2018-02-07 MED ORDER — FUROSEMIDE 10 MG/ML IJ SOLN
40.0000 mg | Freq: Once | INTRAMUSCULAR | Status: AC
  Administered 2018-02-07: 40 mg via INTRAVENOUS
  Filled 2018-02-07: qty 4

## 2018-02-07 MED ORDER — SODIUM CHLORIDE 0.9% FLUSH: 3.0000 mL | INTRAVENOUS | Status: DC | PRN

## 2018-02-07 MED ORDER — HEPARIN SODIUM (PORCINE) 5000 UNIT/ML IJ SOLN
5000.0000 [IU] | Freq: Three times a day (TID) | INTRAMUSCULAR | Status: DC
  Administered 2018-02-07 – 2018-02-12 (×13): 5000 [IU] via SUBCUTANEOUS
  Filled 2018-02-07 (×13): qty 1

## 2018-02-07 MED ORDER — HYDRALAZINE HCL 50 MG PO TABS
100.0000 mg | ORAL_TABLET | Freq: Three times a day (TID) | ORAL | Status: DC
  Administered 2018-02-07 – 2018-02-09 (×7): 100 mg via ORAL
  Filled 2018-02-07 (×8): qty 2

## 2018-02-07 MED ORDER — ACETAMINOPHEN 325 MG PO TABS: 650.0000 mg | ORAL_TABLET | ORAL | Status: DC | PRN

## 2018-02-07 MED ORDER — INSULIN ASPART 100 UNIT/ML ~~LOC~~ SOLN
0.0000 [IU] | Freq: Three times a day (TID) | SUBCUTANEOUS | Status: DC
  Administered 2018-02-08: 3 [IU] via SUBCUTANEOUS
  Administered 2018-02-08 (×2): 2 [IU] via SUBCUTANEOUS
  Administered 2018-02-09: 3 [IU] via SUBCUTANEOUS
  Administered 2018-02-09: 7 [IU] via SUBCUTANEOUS
  Administered 2018-02-10 (×2): 5 [IU] via SUBCUTANEOUS
  Administered 2018-02-10: 2 [IU] via SUBCUTANEOUS
  Administered 2018-02-11 (×2): 3 [IU] via SUBCUTANEOUS
  Administered 2018-02-11: 5 [IU] via SUBCUTANEOUS
  Administered 2018-02-12: 3 [IU] via SUBCUTANEOUS

## 2018-02-07 MED ORDER — FUROSEMIDE 10 MG/ML IJ SOLN
40.0000 mg | Freq: Two times a day (BID) | INTRAMUSCULAR | Status: DC
  Administered 2018-02-08: 40 mg via INTRAVENOUS
  Filled 2018-02-07: qty 4

## 2018-02-07 MED ORDER — VITAMIN D 1000 UNITS PO TABS: 2000.0000 [IU] | ORAL_TABLET | Freq: Every day | ORAL | Status: DC

## 2018-02-07 MED ORDER — EUCERIN EX CREA: 1.0000 "application " | TOPICAL_CREAM | Freq: Every day | CUTANEOUS | Status: DC

## 2018-02-07 MED ORDER — ASPIRIN EC 325 MG PO TBEC
325.0000 mg | DELAYED_RELEASE_TABLET | Freq: Every day | ORAL | Status: DC
  Administered 2018-02-08 – 2018-02-12 (×5): 325 mg via ORAL
  Filled 2018-02-07 (×5): qty 1

## 2018-02-07 MED ORDER — FENTANYL CITRATE (PF) 100 MCG/2ML IJ SOLN: 25.0000 ug | INTRAMUSCULAR | Status: DC | PRN

## 2018-02-07 MED ORDER — CARBOXYMETHYLCELLULOSE SOD PF 0.25 % OP SOLN: 1.0000 [drp] | Freq: Four times a day (QID) | OPHTHALMIC | Status: DC

## 2018-02-07 MED ORDER — POLYETHYLENE GLYCOL 3350 17 G PO PACK
17.0000 g | PACK | Freq: Every day | ORAL | Status: DC | PRN
  Administered 2018-02-09 – 2018-02-10 (×2): 17 g via ORAL
  Filled 2018-02-07 (×2): qty 1

## 2018-02-07 MED ORDER — ATORVASTATIN CALCIUM 20 MG PO TABS
20.0000 mg | ORAL_TABLET | Freq: Every day | ORAL | Status: DC
  Administered 2018-02-08 – 2018-02-11 (×4): 20 mg via ORAL
  Filled 2018-02-07 (×4): qty 1

## 2018-02-07 MED ORDER — HYPROMELLOSE (GONIOSCOPIC) 2.5 % OP SOLN
1.0000 [drp] | Freq: Four times a day (QID) | OPHTHALMIC | Status: DC
  Administered 2018-02-08 – 2018-02-09 (×6): 1 [drp] via OPHTHALMIC
  Filled 2018-02-07: qty 15

## 2018-02-07 MED ORDER — ALBUTEROL SULFATE (2.5 MG/3ML) 0.083% IN NEBU
2.5000 mg | INHALATION_SOLUTION | Freq: Once | RESPIRATORY_TRACT | Status: AC
  Administered 2018-02-07: 2.5 mg via RESPIRATORY_TRACT
  Filled 2018-02-07: qty 3

## 2018-02-07 MED ORDER — CLONIDINE HCL 0.2 MG PO TABS
0.3000 mg | ORAL_TABLET | Freq: Once | ORAL | Status: AC
  Administered 2018-02-07: 18:00:00 0.3 mg via ORAL
  Filled 2018-02-07: qty 1

## 2018-02-07 MED ORDER — SODIUM CHLORIDE 0.9 % IV SOLN: 250.0000 mL | INTRAVENOUS | Status: DC | PRN

## 2018-02-07 MED ORDER — HYDRALAZINE HCL 20 MG/ML IJ SOLN
10.0000 mg | INTRAMUSCULAR | Status: DC | PRN
  Administered 2018-02-07 – 2018-02-12 (×7): 10 mg via INTRAVENOUS
  Filled 2018-02-07 (×7): qty 1

## 2018-02-07 NOTE — ED Provider Notes (Addendum)
[facility] EMERGENCY DEPARTMENT Provider Note   CSN: [id number] Arrival date & time: 02/07/18  1601     History   Chief Complaint Chief Complaint  Patient presents with  . Chest Pain  . Shortness of Breath    HPI John Jacobson is a 62 y.o. male.  The history is provided by the patient. No language interpreter was used.  Chest Pain   This is a new problem. The current episode started more than 1 week ago. The problem occurs constantly. The problem has been gradually worsening. The pain is associated with walking and breathing. The pain is present in the epigastric region. The pain is moderate. The pain does not radiate. Associated symptoms include shortness of breath. He has tried nothing for the symptoms. Risk factors include being elderly.  His past medical history is significant for diabetes and hypertension.  Procedure history is negative for cardiac catheterization.  Shortness of Breath  Associated symptoms include chest pain.  Pt reports he has had increasing shortness of breath for 2 weeks.  Pt reports he has seen his MD for the same.   Pt reports he becomes short of breath with any exertion.    Past Medical History:  Diagnosis Date  . Asthma   . DM (diabetes mellitus) ([place])   . HTN (hypertension)   . RBBB (right bundle branch block)     Patient Active Problem List   Diagnosis Date Noted  . Stroke ([place]) 11/15/2016  . History of stroke   . Labile blood glucose   . Hepatitis C virus infection without hepatic coma   . AKI (acute kidney injury) ([place])   . Brainstem infarct, acute ([place])   . Benign essential HTN   . Hypokalemia   . Leukocytosis   . Acute blood loss anemia   . 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 Cerebellar infarction ([facility]) 11/04/2016  . Hyperlipidemia   . Gait disturbance, post-stroke   . CKD (chronic kidney disease), stage III ([place]) 11/02/2016  . Normocytic anemia  11/02/2016  . Ataxia 11/02/2016  . Ischemic stroke ([facility]) 11/02/2016  . Puncture wound of right foot 11/02/2016  . Chronic diastolic CHF (congestive heart failure) ([facility]) 11/02/2016  . Chronic left-sided low back pain 11/02/2016  . Demand ischemia ([place])   . ARF (acute renal failure) ([place])   . Hypertensive urgency 03/29/2016  . Abnormal electrocardiogram 05/24/2011  . Chest pain 05/24/2011  . Tobacco abuse 05/24/2011  . DM (diabetes mellitus), type 2 with renal complications ([facility]) 77/82/4235    Past Surgical History:  Procedure Laterality Date  . BACK SURGERY  1995  . TOE AMPUTATION  2016        Home Medications    Prior to Admission medications   Medication Sig Start Date End Date Taking? Authorizing Provider  aspirin EC 325 MG EC tablet Take 1 tablet (325 mg total) by mouth daily. 11/05/16   Reyne Dumas, MD  atorvastatin (LIPITOR) 20 MG tablet Take 1 tablet (20 mg total) by mouth daily at 6 PM. 11/11/16   Angiulli, Lavon Paganini, PA-C  Carboxymethylcellulose Sod PF 0.25 % SOLN Apply 1 drop to eye 4 (four) times daily.    [provider]  carvedilol (COREG) 6.25 MG tablet Take 1 tablet (6.25 mg total) by mouth 2 (two) times daily with a meal. 11/16/16   Johnson, Clanford L, MD  cholecalciferol (VITAMIN D) 1000 units tablet Take 2 tablets (2,000  Units total) by mouth daily. 11/11/16   Angiulli, Lavon Paganini, PA-C  cloNIDine (CATAPRES) 0.3 MG tablet Take 1 tablet (0.3 mg total) by mouth 3 (three) times daily. 11/11/16   Angiulli, Lavon Paganini, PA-C  clopidogrel (PLAVIX) 75 MG tablet Take 1 tablet (75 mg total) by mouth daily. 11/11/16   Angiulli, Lavon Paganini, PA-C  doxazosin (CARDURA) 4 MG tablet Take 0.5 tablets (2 mg total) by mouth 2 (two) times daily. 11/11/16   Angiulli, Lavon Paganini, PA-C  furosemide (LASIX) 40 MG tablet Take 1 tablet (40 mg total) by mouth every other day. 11/17/16   Johnson, Clanford L, MD  glipiZIDE (GLUCOTROL) 10 MG tablet Take 0.5 tablets (5 mg total) by mouth daily before  breakfast. 11/16/16   Johnson, Clanford L, MD  glucose 4 GM chewable tablet Chew 1 tablet by mouth daily as needed for low blood sugar.    [provider]  hydrALAZINE (APRESOLINE) 100 MG tablet Take 1 tablet (100 mg total) by mouth 3 (three) times daily. 11/11/16   Angiulli, Lavon Paganini, PA-C  insulin glargine (LANTUS) 100 unit/mL SOPN Inject 0.1 mLs (10 Units total) into the skin at bedtime. 11/11/16   Angiulli, Lavon Paganini, PA-C  losartan (COZAAR) 100 MG tablet Take 1 tablet (100 mg total) by mouth daily. 11/11/16   Angiulli, Lavon Paganini, PA-C  polyethylene glycol (MIRALAX / GLYCOLAX) packet Take 17 g by mouth daily. Patient taking differently: Take 17 g by mouth daily as needed for mild constipation.  04/03/16   Bonnielee Haff, MD  saccharomyces boulardii (FLORASTOR) 250 MG capsule Take 1 capsule (250 mg total) by mouth 2 (two) times daily. 11/11/16   Angiulli, Lavon Paganini, PA-C  senna (SENOKOT) 8.6 MG TABS tablet Take 1 tablet (8.6 mg total) by mouth daily. Patient taking differently: Take 1 tablet by mouth daily as needed for mild constipation.  04/03/16   Bonnielee Haff, MD  Skin Protectants, Misc. (EUCERIN) cream Apply 1 application topically daily. On feet    [provider]  traMADol (ULTRAM) 50 MG tablet Take 1 tablet (50 mg total) by mouth 2 (two) times daily. 01/03/17   Kirsteins, Luanna Salk, MD    Family History Family History  Problem Relation Age of Onset  . Coronary artery disease Mother        MI in her 13s  . Hypertension Unknown   . Diabetes Unknown   . Alzheimer's disease Unknown   . Coronary artery disease Brother     Social History Social History   Tobacco Use  . Smoking status: Former Smoker    Last attempt to quit: 01/07/2018    Years since quitting: 0.0  . Smokeless tobacco: Never Used  Substance Use Topics  . Alcohol use: No    Comment: Few beers every other day hx  . Drug use: No     Allergies   Amlodipine besy-benazepril hcl and Shellfish  allergy   Review of Systems Review of Systems  Respiratory: Positive for shortness of breath.   Cardiovascular: Positive for chest pain.  All other systems reviewed and are negative.    Physical Exam Updated Vital Signs BP (!) 228/106   Pulse (!) 58   Temp 98.5 F (36.9 C) (Oral)   Resp 16   Ht 5\' 7"  (1.702 m)   Wt 79.4 kg   SpO2 98%   BMI 27.41 kg/m   Physical Exam  Constitutional: He appears well-developed and well-nourished.  HENT:  Head: Normocephalic and atraumatic.  Eyes: Pupils are  equal, round, and reactive to light. Conjunctivae and EOM are normal.  Neck: Normal range of motion. Neck supple.  Cardiovascular: Normal rate, regular rhythm and normal pulses.  No murmur heard. Pulmonary/Chest: Effort normal and breath sounds normal. No respiratory distress.  Abdominal: Soft. There is no tenderness.  Musculoskeletal: He exhibits no edema.       Right lower leg: Normal.       Left lower leg: Normal.  Neurological: He is alert.  Skin: Skin is warm and dry.  Psychiatric: He has a normal mood and affect.  Nursing note and vitals reviewed.    ED Treatments / Results  Labs (all labs ordered are listed, but only abnormal results are displayed) Labs Reviewed  BASIC METABOLIC PANEL - Abnormal; Notable for the following components:      Result Value   Glucose, Bld 199 (*)    BUN 39 (*)    Creatinine, Ser 3.35 (*)    GFR calc non Af Amer 18 (*)    GFR calc Af Amer 21 (*)    All other components within normal limits  CBC - Abnormal; Notable for the following components:   WBC 11.9 (*)    RBC 3.51 (*)    Hemoglobin 11.1 (*)    HCT 34.3 (*)    All other components within normal limits  I-STAT TROPONIN, ED    EKG EKG Interpretation  Date/Time:  Wednesday February 07 2018 16:07:55 EDT Ventricular Rate:  72 PR Interval:  148 QRS Duration: 164 QT Interval:  452 QTC Calculation: 494 R Axis:   85 Text Interpretation:  Normal sinus rhythm Right bundle branch  block Abnormal ECG Confirmed by Quintella Reichert [phone]) on 02/07/2018 4:51:20 PM   Radiology Dg Chest 2 View  Result Date: 02/07/2018 CLINICAL DATA:  Left-sided chest tightness x2 weeks. EXAM: CHEST - 2 VIEW COMPARISON:  11/02/2016 CXR FINDINGS: Stable cardiomegaly. Nonaneurysmal thoracic aorta. Gentle dextroconvex curvature of the thoracic spine is noted. There is central vascular congestion without alveolar consolidation, effusion or pneumothorax. Degenerative changes are present along the dorsal spine and both shoulders. IMPRESSION: Cardiomegaly with mild pulmonary vascular congestion suspicious for mild CHF. Electronically Signed   By: Ashley Royalty M.D.   On: 02/07/2018 16:51    Procedures .Critical Care Performed by: Fransico Meadow, PA-C Authorized by: Fransico Meadow, PA-C   Critical care provider statement:    Critical care time (minutes):  45   Critical care start time:  02/07/2018 5:20 PM   Critical care end time:  02/07/2018 7:00 PM   Critical care time was exclusive of:  Separately billable procedures and treating other patients   Critical care was necessary to treat or prevent imminent or life-threatening deterioration of the following conditions:  Circulatory failure and CNS failure or compromise   Critical care was time spent personally by me on the following activities:  Blood draw for specimens, development of treatment plan with patient or surrogate, discussions with consultants, evaluation of patient's response to treatment, examination of patient, interpretation of cardiac output measurements, review of old charts, pulse oximetry, ordering and review of radiographic studies, ordering and review of laboratory studies and ordering and performing treatments and interventions   (including critical care time)  Medications Ordered in ED Medications  nitroGLYCERIN (NITROSTAT) SL tablet 0.4 mg (has no administration in time range)  albuterol (PROVENTIL) (2.5 MG/3ML) 0.083%  nebulizer solution 2.5 mg (has no administration in time range)  furosemide (LASIX) injection 40 mg (40 mg Intravenous Given  02/07/18 1757)  cloNIDine (CATAPRES) tablet 0.3 mg (0.3 mg Oral Given 02/07/18 1759)     Initial Impression / Assessment and Plan / ED Course  I have reviewed the triage vital signs and the nursing notes.  Pertinent labs & imaging results that were available during my care of the patient were reviewed by me and considered in my medical decision making (see chart for details).     MDM  Pt given IV lasix, nitroglycerin, albuterol  and clonidine.  Chest xray shows CHF.  Pt reports he is feeling some better.  He is not as short of breath.  Pt is still having some chest pain.  He reports he thinks this is soreness from breathing hard.   I discussed with Hospitalist Dr. Myna Hidalgo will see for admission  Final Clinical Impressions(s) / ED Diagnoses   Final diagnoses:  Congestive heart failure, unspecified HF chronicity, unspecified heart failure type [facility])  Hypertensive crisis  Chest pain, unspecified type    ED Discharge Orders    None       Sidney Ace 02/07/18 2016    Sidney Ace 02/07/18 2018    Quintella Reichert, MD 02/08/18 1519    Fransico Meadow, PA-C 02/23/18 1451    Quintella Reichert, MD 02/28/18 1157

## 2018-02-07 NOTE — ED Triage Notes (Signed)
C/o intermittent L sided chest tightness and sob x 2 weeks.  Pain increased over the past 2 days and is worse after eating.

## 2018-02-07 NOTE — ED Notes (Signed)
Report called  

## 2018-02-07 NOTE — H&P (Signed)
History and Physical    John Jacobson XTG:[id number] DOB: 07-23-1955 DOA: 02/07/2018  PCP: [facility]   Patient coming from: Home   Chief Complaint: SOB, chest pain   HPI: John Jacobson is a 62 y.o. male with medical history significant for hypertension chronic diastolic CHF, chronic kidney disease stage III, insulin-dependent diabetes mellitus, and history of CVA, now presenting to the emergency department for evaluation of chest discomfort and shortness of breath for the past 2 weeks.  Patient reports that he has been followed at the [place], had worsening renal function noted approximately 1 month ago, and has difficulty recalling the details but stated that his Lasix was either discontinued or reduced at that time.  He is uncertain about which medications he is taking currently at home.  Denies any recent fevers or chills, reports some mild lower extremity swelling bilaterally, reports orthopnea, and no significant cough.  Chest discomfort is described as moderate in intensity, localized to the anterior chest bilaterally, present both at rest and with activity, not associated with nausea or diaphoresis, and with no alleviating or exacerbating factors identified.  ED Course: Upon arrival to the ED, patient is found to be afebrile, saturating adequately on room air, bradycardic in the mid 40s, and hypertensive to 230/95.  EKG features a sinus rhythm with RBBB, similar to prior.  Chest x-ray features cardiomegaly and pulmonary vascular congestion suspicious for CHF.  Chemistry panel is notable for a creatinine of 3.35, up from 2.30 a year ago.  CBC is not notable for leukocytosis to 11,900 and a mild normocytic anemia.  Troponin is normal.  Patient was given albuterol, 40 mg IV Lasix, nitroglycerin, and clonidine in the ED. Blood pressure improved, mild chest discomfort and dyspnea at rest persists, and he will be observed for further evaluation and management.   Review of Systems:    All other systems reviewed and apart from HPI, are negative.  Past Medical History:  Diagnosis Date  . Asthma   . DM (diabetes mellitus) ([place])   . HTN (hypertension)   . RBBB (right bundle branch block)     Past Surgical History:  Procedure Laterality Date  . BACK SURGERY  1995  . TOE AMPUTATION  2016     reports that he quit smoking about 4 weeks ago. He has never used smokeless tobacco. He reports that he does not drink alcohol or use drugs.  Allergies  Allergen Reactions  . Amlodipine Besy-Benazepril Hcl Anaphylaxis, Shortness Of Breath and Swelling    Mouth and tongue swelling  . Shellfish Allergy Anaphylaxis, Shortness Of Breath and Swelling    Family History  Problem Relation Age of Onset  . Coronary artery disease Mother        MI in her 58s  . Hypertension Unknown   . Diabetes Unknown   . Alzheimer's disease Unknown   . Coronary artery disease Brother      Prior to Admission medications   Medication Sig Start Date End Date Taking? Authorizing Provider  aspirin EC 325 MG EC tablet Take 1 tablet (325 mg total) by mouth daily. 11/05/16   Reyne Dumas, MD  atorvastatin (LIPITOR) 20 MG tablet Take 1 tablet (20 mg total) by mouth daily at 6 PM. 11/11/16   Angiulli, Lavon Paganini, PA-C  Carboxymethylcellulose Sod PF 0.25 % SOLN Apply 1 drop to eye 4 (four) times daily.    [provider]  carvedilol (COREG) 6.25 MG tablet Take 1 tablet (6.25 mg total)  by mouth 2 (two) times daily with a meal. 11/16/16   Johnson, Clanford L, MD  cholecalciferol (VITAMIN D) 1000 units tablet Take 2 tablets (2,000 Units total) by mouth daily. 11/11/16   Angiulli, Lavon Paganini, PA-C  cloNIDine (CATAPRES) 0.3 MG tablet Take 1 tablet (0.3 mg total) by mouth 3 (three) times daily. 11/11/16   Angiulli, Lavon Paganini, PA-C  clopidogrel (PLAVIX) 75 MG tablet Take 1 tablet (75 mg total) by mouth daily. 11/11/16   Angiulli, Lavon Paganini, PA-C  doxazosin (CARDURA) 4 MG tablet Take 0.5 tablets (2 mg total) by mouth 2  (two) times daily. 11/11/16   Angiulli, Lavon Paganini, PA-C  furosemide (LASIX) 40 MG tablet Take 1 tablet (40 mg total) by mouth every other day. 11/17/16   Johnson, Clanford L, MD  glipiZIDE (GLUCOTROL) 10 MG tablet Take 0.5 tablets (5 mg total) by mouth daily before breakfast. 11/16/16   Johnson, Clanford L, MD  glucose 4 GM chewable tablet Chew 1 tablet by mouth daily as needed for low blood sugar.    [provider]  hydrALAZINE (APRESOLINE) 100 MG tablet Take 1 tablet (100 mg total) by mouth 3 (three) times daily. 11/11/16   Angiulli, Lavon Paganini, PA-C  insulin glargine (LANTUS) 100 unit/mL SOPN Inject 0.1 mLs (10 Units total) into the skin at bedtime. 11/11/16   Angiulli, Lavon Paganini, PA-C  losartan (COZAAR) 100 MG tablet Take 1 tablet (100 mg total) by mouth daily. 11/11/16   Angiulli, Lavon Paganini, PA-C  polyethylene glycol (MIRALAX / GLYCOLAX) packet Take 17 g by mouth daily. Patient taking differently: Take 17 g by mouth daily as needed for mild constipation.  04/03/16   Bonnielee Haff, MD  saccharomyces boulardii (FLORASTOR) 250 MG capsule Take 1 capsule (250 mg total) by mouth 2 (two) times daily. 11/11/16   Angiulli, Lavon Paganini, PA-C  senna (SENOKOT) 8.6 MG TABS tablet Take 1 tablet (8.6 mg total) by mouth daily. Patient taking differently: Take 1 tablet by mouth daily as needed for mild constipation.  04/03/16   Bonnielee Haff, MD  Skin Protectants, Misc. (EUCERIN) cream Apply 1 application topically daily. On feet    [provider]  traMADol (ULTRAM) 50 MG tablet Take 1 tablet (50 mg total) by mouth 2 (two) times daily. 01/03/17   Charlett Blake, MD    Physical Exam: Vitals:   02/07/18 1915 02/07/18 1930 02/07/18 1945 02/07/18 2030  BP: (!) 200/87 (!) 167/91 (!) 184/88 (!) 182/85  Pulse: (!) 52 (!) 51 (!) 49 (!) 48  Resp: 18 13 15 12   Temp:      TempSrc:      SpO2: 97% 97% 96% 99%  Weight:      Height:          Constitutional: NAD, calm  Eyes: PERTLA, lids and conjunctivae  normal ENMT: Mucous membranes are moist. Posterior pharynx clear of any exudate or lesions.   Neck: normal, supple, no masses, no thyromegaly Respiratory: Diminished at bases, fine rales bilaterally. No accessory muscle use.  Cardiovascular: S1 & S2 heard, regular rate and rhythm. 1+ pretibial edema bilaterally. Abdomen: No distension, no tenderness, no masses palpated. Bowel sounds normal.  Musculoskeletal: no clubbing / cyanosis. No joint deformity upper and lower extremities.    Skin: hypopigmented macules about the neck and upper trunk. Warm, dry, well-perfused. Neurologic: No gross facial asymmetry. Sensation intact. Moving all extremities.  Psychiatric: Alert and oriented to person, place, and situation. Pleasant and cooperative.     Labs on  Admission: I have personally reviewed following labs and imaging studies  CBC: Recent Labs  Lab 02/07/18 1616  WBC 11.9*  HGB 11.1*  HCT 34.3*  MCV 97.7  PLT 300   Basic Metabolic Panel: Recent Labs  Lab 02/07/18 1616  NA 141  K 3.7  CL 108  CO2 23  GLUCOSE 199*  BUN 39*  CREATININE 3.35*  CALCIUM 9.4   GFR: Estimated Creatinine Clearance: 22.8 mL/min (A) (by C-G formula based on SCr of 3.35 mg/dL (H)). Liver Function Tests: No results for input(s): AST, ALT, ALKPHOS, BILITOT, PROT, ALBUMIN in the last 168 hours. No results for input(s): LIPASE, AMYLASE in the last 168 hours. No results for input(s): AMMONIA in the last 168 hours. Coagulation Profile: No results for input(s): INR, PROTIME in the last 168 hours. Cardiac Enzymes: No results for input(s): CKTOTAL, CKMB, CKMBINDEX, TROPONINI in the last 168 hours. BNP (last 3 results) No results for input(s): PROBNP in the last 8760 hours. HbA1C: No results for input(s): HGBA1C in the last 72 hours. CBG: No results for input(s): GLUCAP in the last 168 hours. Lipid Profile: No results for input(s): CHOL, HDL, LDLCALC, TRIG, CHOLHDL, LDLDIRECT in the last 72 hours. Thyroid  Function Tests: No results for input(s): TSH, T4TOTAL, FREET4, T3FREE, THYROIDAB in the last 72 hours. Anemia Panel: No results for input(s): VITAMINB12, FOLATE, FERRITIN, TIBC, IRON, RETICCTPCT in the last 72 hours. Urine analysis:    Component Value Date/Time   COLORURINE YELLOW 11/02/2016 1909   APPEARANCEUR CLEAR 11/02/2016 1909   LABSPEC 1.017 11/02/2016 1909   PHURINE 5.0 11/02/2016 1909   GLUCOSEU >=500 (A) 11/02/2016 1909   HGBUR SMALL (A) 11/02/2016 1909   BILIRUBINUR NEGATIVE 11/02/2016 1909   KETONESUR NEGATIVE 11/02/2016 1909   PROTEINUR >=300 (A) 11/02/2016 1909   NITRITE NEGATIVE 11/02/2016 1909   LEUKOCYTESUR NEGATIVE 11/02/2016 1909   Sepsis Labs: @LABRCNTIP (procalcitonin:4,lacticidven:4) )No results found for this or any previous visit (from the past 240 hour(s)).   Radiological Exams on Admission: Dg Chest 2 View  Result Date: 02/07/2018 CLINICAL DATA:  Left-sided chest tightness x2 weeks. EXAM: CHEST - 2 VIEW COMPARISON:  11/02/2016 CXR FINDINGS: Stable cardiomegaly. Nonaneurysmal thoracic aorta. Gentle dextroconvex curvature of the thoracic spine is noted. There is central vascular congestion without alveolar consolidation, effusion or pneumothorax. Degenerative changes are present along the dorsal spine and both shoulders. IMPRESSION: Cardiomegaly with mild pulmonary vascular congestion suspicious for mild CHF. Electronically Signed   By: Ashley Royalty M.D.   On: 02/07/2018 16:51    EKG: Independently reviewed. Sinus rhythm, RBBB, no significant change from prior.   Assessment/Plan  1. Acute on chronic diastolic CHF  - Presents with 2 weeks of SOB and chest discomfort, found to be in acute CHF, and reports that Lasix was reduced or discontinued at [place] (he has trouble recalling details and isn't sure what he is taking at home) ~1 month ago  - He was given Lasix 40 mg IV in ED  - Continue diuresis with Lasix 40 mg IV q12h, follow daily wt and I/O's, repeat chem  panel in am, hold ARB until renal function stabilized, continue Coreg as HR allows    2. Chest pain  - Reports 2 weeks of SOB and chest discomfort  - EKG appears unchanged, troponin is normal, and CXR consistent with acute CHF  - Continue cardiac monitoring, serial troponin measurements  - Continue ASA, Plavix, statin, and beta-blocker    3. Acute renal failure superimposed on CKD stage III  -  SCr is 3.35 on admission, up from 2.30 one year ago  - [facility] and [facility] or stopped ~1 month ago d/t worsening renal function  - Check urine chemistries, renally-dose medications, hold losartan, repeat chem panel in am   4. Hypertension with hypertensive urgency   - BP elevated to 230/100 in ED in setting of hypervolemia and not taking prescribed antihypertensives today, improved with clonidine and Lasix in ED  - Anticipate improvement with diuresis  - Continue Coreg as heart rate allows, continue clonidine and hydralazine  - Hold ARB until renal function stablilizes  5. Insulin-dependent DM  - A1c was 8.6% in May 2018  - Managed at home with Lantus 10 units qHS and glipizide  - Check CBG's and start with Novolog correctional only   6. History of CVA - Denies new deficits  - Continue ASA, Plavix and statin     DVT prophylaxis: sq heparin  Code Status: Full  Family Communication: Discussed with patient  Consults called: None Admission status: Observation    Vianne Bulls, MD [facility] Pager [phone]  If 7PM-7AM, please contact night-coverage [email or web address] Password [facility]  02/07/2018, 8:41 PM

## 2018-02-07 NOTE — Progress Notes (Signed)
*  Preliminary Results* Right lower extremity venous duplex completed. Right lower extremity is negative for deep vein thrombosis. There is no evidence of right Baker's cyst.  02/07/2018 6:36 PM  Maudry Mayhew, MHA, RVT, RDCS, RDMS

## 2018-02-08 ENCOUNTER — Inpatient Hospital Stay (HOSPITAL_COMMUNITY): Payer: Medicaid Other

## 2018-02-08 ENCOUNTER — Other Ambulatory Visit: Payer: Self-pay

## 2018-02-08 DIAGNOSIS — Z79891 Long term (current) use of opiate analgesic: Secondary | ICD-10-CM | POA: Diagnosis not present

## 2018-02-08 DIAGNOSIS — Z86010 Personal history of colonic polyps: Secondary | ICD-10-CM | POA: Diagnosis not present

## 2018-02-08 DIAGNOSIS — J45909 Unspecified asthma, uncomplicated: Secondary | ICD-10-CM | POA: Diagnosis present

## 2018-02-08 DIAGNOSIS — Z7902 Long term (current) use of antithrombotics/antiplatelets: Secondary | ICD-10-CM | POA: Diagnosis not present

## 2018-02-08 DIAGNOSIS — Z79899 Other long term (current) drug therapy: Secondary | ICD-10-CM | POA: Diagnosis not present

## 2018-02-08 DIAGNOSIS — Z7982 Long term (current) use of aspirin: Secondary | ICD-10-CM | POA: Diagnosis not present

## 2018-02-08 DIAGNOSIS — I5031 Acute diastolic (congestive) heart failure: Secondary | ICD-10-CM | POA: Diagnosis present

## 2018-02-08 DIAGNOSIS — R0789 Other chest pain: Secondary | ICD-10-CM | POA: Diagnosis present

## 2018-02-08 DIAGNOSIS — Z8673 Personal history of transient ischemic attack (TIA), and cerebral infarction without residual deficits: Secondary | ICD-10-CM | POA: Diagnosis not present

## 2018-02-08 DIAGNOSIS — N179 Acute kidney failure, unspecified: Secondary | ICD-10-CM | POA: Diagnosis present

## 2018-02-08 DIAGNOSIS — Z794 Long term (current) use of insulin: Secondary | ICD-10-CM | POA: Diagnosis not present

## 2018-02-08 DIAGNOSIS — E785 Hyperlipidemia, unspecified: Secondary | ICD-10-CM | POA: Diagnosis present

## 2018-02-08 DIAGNOSIS — I132 Hypertensive heart and chronic kidney disease with heart failure and with stage 5 chronic kidney disease, or end stage renal disease: Secondary | ICD-10-CM | POA: Diagnosis present

## 2018-02-08 DIAGNOSIS — I5033 Acute on chronic diastolic (congestive) heart failure: Secondary | ICD-10-CM | POA: Diagnosis present

## 2018-02-08 DIAGNOSIS — D631 Anemia in chronic kidney disease: Secondary | ICD-10-CM | POA: Diagnosis present

## 2018-02-08 DIAGNOSIS — Z8249 Family history of ischemic heart disease and other diseases of the circulatory system: Secondary | ICD-10-CM | POA: Diagnosis not present

## 2018-02-08 DIAGNOSIS — Z89429 Acquired absence of other toe(s), unspecified side: Secondary | ICD-10-CM | POA: Diagnosis not present

## 2018-02-08 DIAGNOSIS — I4510 Unspecified right bundle-branch block: Secondary | ICD-10-CM | POA: Diagnosis present

## 2018-02-08 DIAGNOSIS — N185 Chronic kidney disease, stage 5: Secondary | ICD-10-CM | POA: Diagnosis present

## 2018-02-08 DIAGNOSIS — E1122 Type 2 diabetes mellitus with diabetic chronic kidney disease: Secondary | ICD-10-CM | POA: Diagnosis present

## 2018-02-08 DIAGNOSIS — E113592 Type 2 diabetes mellitus with proliferative diabetic retinopathy without macular edema, left eye: Secondary | ICD-10-CM | POA: Diagnosis present

## 2018-02-08 DIAGNOSIS — I160 Hypertensive urgency: Secondary | ICD-10-CM | POA: Diagnosis present

## 2018-02-08 DIAGNOSIS — M545 Low back pain: Secondary | ICD-10-CM | POA: Diagnosis present

## 2018-02-08 DIAGNOSIS — F17211 Nicotine dependence, cigarettes, in remission: Secondary | ICD-10-CM | POA: Diagnosis present

## 2018-02-08 DIAGNOSIS — E1165 Type 2 diabetes mellitus with hyperglycemia: Secondary | ICD-10-CM | POA: Diagnosis not present

## 2018-02-08 DIAGNOSIS — G8929 Other chronic pain: Secondary | ICD-10-CM | POA: Diagnosis present

## 2018-02-08 LAB — GLUCOSE, CAPILLARY
GLUCOSE-CAPILLARY: 189 mg/dL — AB (ref 70–99)
Glucose-Capillary: 162 mg/dL — ABNORMAL HIGH (ref 70–99)
Glucose-Capillary: 235 mg/dL — ABNORMAL HIGH (ref 70–99)
Glucose-Capillary: 291 mg/dL — ABNORMAL HIGH (ref 70–99)

## 2018-02-08 LAB — BASIC METABOLIC PANEL
ANION GAP: 10 (ref 5–15)
BUN: 39 mg/dL — ABNORMAL HIGH (ref 8–23)
CHLORIDE: 107 mmol/L (ref 98–111)
CO2: 25 mmol/L (ref 22–32)
CREATININE: 3.43 mg/dL — AB (ref 0.61–1.24)
Calcium: 9.2 mg/dL (ref 8.9–10.3)
GFR calc non Af Amer: 18 mL/min — ABNORMAL LOW (ref >60)
GFR, EST AFRICAN AMERICAN: 20 mL/min — AB (ref >60)
Glucose, Bld: 191 mg/dL — ABNORMAL HIGH (ref 70–99)
POTASSIUM: 4 mmol/L (ref 3.5–5.1)
Sodium: 142 mmol/L (ref 135–145)

## 2018-02-08 LAB — MRSA PCR SCREENING: MRSA BY PCR: NEGATIVE

## 2018-02-08 LAB — TROPONIN I

## 2018-02-08 LAB — SODIUM, URINE, RANDOM: Sodium, Ur: 118 mmol/L

## 2018-02-08 LAB — CREATININE, URINE, RANDOM: CREATININE, URINE: 38.53 mg/dL

## 2018-02-08 LAB — HIV ANTIBODY (ROUTINE TESTING W REFLEX): HIV Screen 4th Generation wRfx: NONREACTIVE

## 2018-02-08 LAB — D-DIMER, QUANTITATIVE: D-Dimer, Quant: 1.01 ug/mL-FEU — ABNORMAL HIGH (ref 0.00–0.50)

## 2018-02-08 MED ORDER — TECHNETIUM TC 99M DIETHYLENETRIAME-PENTAACETIC ACID
31.1000 | Freq: Once | INTRAVENOUS | Status: AC | PRN
  Administered 2018-02-08: 31.1 via RESPIRATORY_TRACT

## 2018-02-08 MED ORDER — FUROSEMIDE 10 MG/ML IJ SOLN: 120.0000 mg | Freq: Two times a day (BID) | INTRAVENOUS | Status: DC

## 2018-02-08 MED ORDER — ALBUTEROL SULFATE (2.5 MG/3ML) 0.083% IN NEBU
2.5000 mg | INHALATION_SOLUTION | RESPIRATORY_TRACT | Status: DC | PRN
  Administered 2018-02-08 – 2018-02-09 (×4): 2.5 mg via RESPIRATORY_TRACT
  Filled 2018-02-08 (×4): qty 3

## 2018-02-08 MED ORDER — TECHNETIUM TO 99M ALBUMIN AGGREGATED
4.3300 | Freq: Once | INTRAVENOUS | Status: AC | PRN
  Administered 2018-02-08: 4.33 via INTRAVENOUS

## 2018-02-08 MED ORDER — FUROSEMIDE 10 MG/ML IJ SOLN
80.0000 mg | Freq: Once | INTRAMUSCULAR | Status: AC
  Administered 2018-02-08: 80 mg via INTRAVENOUS
  Filled 2018-02-08: qty 8

## 2018-02-08 MED ORDER — ALUM & MAG HYDROXIDE-SIMETH 200-200-20 MG/5ML PO SUSP
30.0000 mL | ORAL | Status: DC | PRN
  Administered 2018-02-08: 30 mL via ORAL
  Filled 2018-02-08: qty 30

## 2018-02-08 MED ORDER — FUROSEMIDE 10 MG/ML IJ SOLN
120.0000 mg | Freq: Two times a day (BID) | INTRAVENOUS | Status: DC
  Administered 2018-02-08 – 2018-02-09 (×2): 120 mg via INTRAVENOUS
  Filled 2018-02-08: qty 2
  Filled 2018-02-08: qty 10
  Filled 2018-02-08: qty 12

## 2018-02-08 MED ORDER — FAMOTIDINE 20 MG PO TABS
10.0000 mg | ORAL_TABLET | Freq: Every day | ORAL | Status: DC
  Administered 2018-02-08 – 2018-02-12 (×5): 10 mg via ORAL
  Filled 2018-02-08 (×5): qty 1

## 2018-02-08 NOTE — Progress Notes (Signed)
0200 pt's bed alarm went off. RN responded. Pt standing on the side of the bed trying to pee. Pt had pulled out his IV in the process. A new IV was placed.  Pt started to c/o of headache RN checked his BP and it was 201/81 and pt was given PRN Hydralazine was given( see MAR).  0220 Pt then stated that he was having some chest tightness and pain but he thought it was indigestion. Pt was given Maalox ( see MAR) Per orders. The pain was unrelieved and pt stated it was more to his left side. RN obtained EKG and gave Nitrostat (see MAR) per orders. MD paged and placed orders. Will continue to monitor pt.

## 2018-02-08 NOTE — Progress Notes (Signed)
PROGRESS NOTE    John Jacobson  KGY:[id number] DOB: 05-Sep-1955 DOA: 02/07/2018 PCP: [facility]  Outpatient Specialists:     Brief Narrative:   John Jacobson is a 62 y.o. male with medical history significant for hypertension, chronic diastolic CHF, chronic kidney disease stage III, insulin-dependent diabetes mellitus, and history of CVA.  Echocardiogram done on 11/03/2016 revealed EF of 60% and grade 1 diastolic dysfunction.  Patient's medical care is normally very at the [place] in [place].  Patient tells me that he had EGD and colonoscopy about a year ago that showed normal EGD and colonic polyp that was removed.  Patient was admitted with acute on chronic diastolic CHF, hypertensive urgency, as well as left lateral chest wall pain.  There may be a pleuritic component to the chest pain.  Patient tells me that he moved has recently.  D-dimer is elevated (1.01), but patient has chronic kidney disease.  Will proceed with VQ scan.  Blood pressure control is improving.  Will increase the dose of Lasix from IV 40 mg twice daily to 120 mg twice daily.  Monitor renal function and electrolytes closely.  Shortness of breath is also improving.  Assessment & Plan:   Principal Problem:   Acute on chronic diastolic CHF (congestive heart failure) (HCC) Active Problems:   Chest pain   DM (diabetes mellitus), type 2 with renal complications (HCC)   Hypertensive urgency   History of stroke   Acute renal failure superimposed on stage 3 chronic kidney disease (HCC)   Hypertension   Acute diastolic CHF (congestive heart failure) (HCC)   Acute on chronic diastolic CHF  - Presents with 2 weeks of SOB and chest discomfort, found to be in acute CHF, and reports that Lasix was reduced or discontinued at [place] (he has trouble recalling details and isn't sure what he is taking at home) ~1 month ago  - He was given Lasix 40 mg IV in ED  - Continue diuresis with Lasix 40 mg IV q12h, follow daily wt  and I/O's, repeat chem panel in am, hold ARB until renal function stabilized, continue Coreg as HR allows   02/08/2018: Increase IV Lasix to 120 mg twice daily.  Continue to monitor renal function and electrolytes.  2. Chest pain  - Reports 2 weeks of SOB and chest discomfort  - EKG appears unchanged, troponin is normal, and CXR consistent with acute CHF  - Continue cardiac monitoring, serial troponin measurements  - Continue ASA, Plavix, statin, and beta-blocker   02/08/2018: D-dimer is elevated, but patient has chronic kidney disease.  Patient has also recently moved house, and may have been lifting objects.  We will proceed with V/Q scan to rule out possible PE as well.  Chest pain has improved significantly.  3. Acute renal failure superimposed on CKD stage III  - SCr is 3.35 on admission, up from 2.30 one year ago  - [facility] and having [facility]ix reduced or stopped ~1 month ago d/t worsening renal function  - Check urine chemistries, renally-dose medications, hold losartan, repeat chem panel in am  02/08/2018: AKI could be secondary to cardiorenal renal syndrome and hemodynamic instability.  Monitor renal function and electrolytes closely.  Optimize blood pressure, but cautiously.  4. Hypertension with hypertensive urgency   - BP elevated to 230/100 in ED in setting of hypervolemia and not taking prescribed antihypertensives today, improved with clonidine and Lasix in ED  - Anticipate improvement with diuresis  -  Continue Coreg as heart rate allows, continue clonidine and hydralazine  - Hold ARB until renal function stablilizes -02/08/2018: Gradually optimize patient's blood pressure.  Avoid nephrotoxic's.  Dose all medications assuming GFR of less than 15.  Increased the dose of the diuretics from IV Lasix 40 mg twice daily to 120 mg twice daily.  5. Insulin-dependent DM  - A1c was 8.6% in May 2018  - Managed at home with Lantus 10 units qHS and glipizide  - Check  CBG's and start with Novolog correctional only  02/08/2018: Blood sugar control is improving.  6. History of CVA - Denies new deficits  - Continue ASA, Plavix and statin     DVT prophylaxis: sq heparin  Code Status: Full  Family Communication:   Consults called: None  Procedures:   None  Antimicrobials:   None   Subjective: Shortness of breath is improving. Left lateral chest wall pain, with possible area of maximal tenderness.  Objective: Vitals:   02/08/18 0735 02/08/18 0822 02/08/18 0919 02/08/18 1122  BP: (!) 166/73 (!) 157/70 (!) 176/91 (!) 186/82  Pulse: 68 73  (!) 59  Resp: 17 13  18   Temp: 98 F (36.7 C)   98.1 F (36.7 C)  TempSrc: Oral   Oral  SpO2: 96% 98%  97%  Weight:      Height:        Intake/Output Summary (Last 24 hours) at 02/08/2018 1504 Last data filed at 02/08/2018 5852 Gross per 24 hour  Intake 3 ml  Output 2575 ml  Net -2572 ml   Filed Weights   02/07/18 1612 02/07/18 2144 02/08/18 0358  Weight: 79.4 kg 81.3 kg 80.6 kg    Examination:  General exam: Appears calm and comfortable  Respiratory system: Decreased air entry posteriorly.   Cardiovascular system: S1 & S2 heard Gastrointestinal system: Abdomen is nondistended, soft and nontender. No organomegaly or masses felt. Normal bowel sounds heard. Central nervous system: Alert and oriented. No focal neurological deficits. Extremities: 1+ pitting bilateral leg edema.   Psychiatry: Judgement and insight appear normal. Mood & affect appropriate.     Data Reviewed: I have personally reviewed following labs and imaging studies  CBC: Recent Labs  Lab 02/07/18 1616  WBC 11.9*  HGB 11.1*  HCT 34.3*  MCV 97.7  PLT 778   Basic Metabolic Panel: Recent Labs  Lab 02/07/18 1616 02/08/18 0016  NA 141 142  K 3.7 4.0  CL 108 107  CO2 23 25  GLUCOSE 199* 191*  BUN 39* 39*  CREATININE 3.35* 3.43*  CALCIUM 9.4 9.2   GFR: Estimated Creatinine Clearance: 22.4 mL/min (A) (by  C-G formula based on SCr of 3.43 mg/dL (H)). Liver Function Tests: No results for input(s): AST, ALT, ALKPHOS, BILITOT, PROT, ALBUMIN in the last 168 hours. No results for input(s): LIPASE, AMYLASE in the last 168 hours. No results for input(s): AMMONIA in the last 168 hours. Coagulation Profile: No results for input(s): INR, PROTIME in the last 168 hours. Cardiac Enzymes: Recent Labs  Lab 02/08/18 0016 02/08/18 0557  TROPONINI <0.03 <0.03   BNP (last 3 results) No results for input(s): PROBNP in the last 8760 hours. HbA1C: No results for input(s): HGBA1C in the last 72 hours. CBG: Recent Labs  Lab 02/07/18 2321 02/08/18 0805 02/08/18 1157  GLUCAP 189* 162* 235*   Lipid Profile: No results for input(s): CHOL, HDL, LDLCALC, TRIG, CHOLHDL, LDLDIRECT in the last 72 hours. Thyroid Function Tests: No results for input(s): TSH, T4TOTAL, FREET4,  T3FREE, THYROIDAB in the last 72 hours. Anemia Panel: No results for input(s): VITAMINB12, FOLATE, FERRITIN, TIBC, IRON, RETICCTPCT in the last 72 hours. Urine analysis:    Component Value Date/Time   COLORURINE YELLOW 11/02/2016 1909   APPEARANCEUR CLEAR 11/02/2016 1909   LABSPEC 1.017 11/02/2016 1909   PHURINE 5.0 11/02/2016 1909   GLUCOSEU >=500 (A) 11/02/2016 1909   HGBUR SMALL (A) 11/02/2016 1909   BILIRUBINUR NEGATIVE 11/02/2016 [place] NEGATIVE 11/02/2016 1909   PROTEINUR >=300 (A) 11/02/2016 1909   NITRITE NEGATIVE 11/02/2016 1909   LEUKOCYTESUR NEGATIVE 11/02/2016 1909   Sepsis Labs: @LABRCNTIP (procalcitonin:4,lacticidven:4)  ) Recent Results (from the past 240 hour(s))  MRSA PCR Screening     Status: None   Collection Time: 02/07/18 10:13 PM  Result Value Ref Range Status   MRSA by PCR NEGATIVE NEGATIVE Final    Comment:        The GeneXpert MRSA Assay (FDA approved for NASAL specimens only), is one component of a comprehensive MRSA colonization surveillance program. It is not intended to diagnose  MRSA infection nor to guide or monitor treatment for MRSA infections. Performed at [facility], [place] [address], [place], [place] [address]          Radiology Studies: Dg Chest 2 View  Result Date: 02/07/2018 CLINICAL DATA:  Left-sided chest tightness x2 weeks. EXAM: CHEST - 2 VIEW COMPARISON:  11/02/2016 CXR FINDINGS: Stable cardiomegaly. Nonaneurysmal thoracic aorta. Gentle dextroconvex curvature of the thoracic spine is noted. There is central vascular congestion without alveolar consolidation, effusion or pneumothorax. Degenerative changes are present along the dorsal spine and both shoulders. IMPRESSION: Cardiomegaly with mild pulmonary vascular congestion suspicious for mild CHF. Electronically Signed   By: Ashley Royalty M.D.   On: 02/07/2018 16:51        Scheduled Meds: . aspirin  325 mg Oral Daily  . atorvastatin  20 mg Oral q1800  . carvedilol  6.25 mg Oral BID WC  . cloNIDine  0.3 mg Oral TID  . famotidine  10 mg Oral Daily  . heparin  5,000 Units Subcutaneous Q8H  . hydrALAZINE  100 mg Oral TID  . hydroxypropyl methylcellulose / hypromellose  1 drop Both Eyes QID  . insulin aspart  0-5 Units Subcutaneous QHS  . insulin aspart  0-9 Units Subcutaneous TID WC  . sodium chloride flush  3 mL Intravenous Q12H   Continuous Infusions: . sodium chloride    . furosemide       LOS: 0 days    Time spent: 35 minutes    Dana Allan, MD  [facility] Pager #: [phone] 7PM-7AM contact night coverage as above

## 2018-02-09 LAB — RENAL FUNCTION PANEL
Albumin: 3.4 g/dL — ABNORMAL LOW (ref 3.5–5.0)
Anion gap: 7 (ref 5–15)
BUN: 50 mg/dL — ABNORMAL HIGH (ref 8–23)
CO2: 28 mmol/L (ref 22–32)
Calcium: 8.9 mg/dL (ref 8.9–10.3)
Chloride: 103 mmol/L (ref 98–111)
Creatinine, Ser: 4 mg/dL — ABNORMAL HIGH (ref 0.61–1.24)
GFR calc Af Amer: 17 mL/min — ABNORMAL LOW (ref >60)
GFR calc non Af Amer: 15 mL/min — ABNORMAL LOW (ref >60)
Glucose, Bld: 234 mg/dL — ABNORMAL HIGH (ref 70–99)
Phosphorus: 3.9 mg/dL (ref 2.5–4.6)
Potassium: 3.7 mmol/L (ref 3.5–5.1)
Sodium: 138 mmol/L (ref 135–145)

## 2018-02-09 LAB — GLUCOSE, CAPILLARY
GLUCOSE-CAPILLARY: 335 mg/dL — AB (ref 70–99)
GLUCOSE-CAPILLARY: 272 mg/dL — AB (ref 70–99)
Glucose-Capillary: 226 mg/dL — ABNORMAL HIGH (ref 70–99)
Glucose-Capillary: 96 mg/dL (ref 70–99)

## 2018-02-09 LAB — UREA NITROGEN, URINE: UREA NITROGEN UR: 203 mg/dL

## 2018-02-09 LAB — MAGNESIUM: Magnesium: 1.8 mg/dL (ref 1.7–2.4)

## 2018-02-09 MED ORDER — SENNOSIDES-DOCUSATE SODIUM 8.6-50 MG PO TABS
1.0000 | ORAL_TABLET | Freq: Every evening | ORAL | Status: DC | PRN
  Administered 2018-02-09 – 2018-02-10 (×2): 1 via ORAL
  Filled 2018-02-09 (×2): qty 1

## 2018-02-09 MED ORDER — FENTANYL CITRATE (PF) 100 MCG/2ML IJ SOLN
25.0000 ug | INTRAMUSCULAR | Status: DC | PRN
  Administered 2018-02-11 – 2018-02-12 (×2): 25 ug via INTRAVENOUS
  Filled 2018-02-09 (×2): qty 2

## 2018-02-09 MED ORDER — ALBUTEROL SULFATE (2.5 MG/3ML) 0.083% IN NEBU
2.5000 mg | INHALATION_SOLUTION | RESPIRATORY_TRACT | Status: DC | PRN
  Administered 2018-02-09 – 2018-02-11 (×3): 2.5 mg via RESPIRATORY_TRACT
  Filled 2018-02-09 (×3): qty 3

## 2018-02-09 MED ORDER — HYPROMELLOSE (GONIOSCOPIC) 2.5 % OP SOLN
1.0000 [drp] | Freq: Two times a day (BID) | OPHTHALMIC | Status: DC
  Administered 2018-02-09 – 2018-02-12 (×6): 1 [drp] via OPHTHALMIC
  Filled 2018-02-09: qty 15

## 2018-02-09 MED ORDER — DOXAZOSIN MESYLATE 4 MG PO TABS
4.0000 mg | ORAL_TABLET | Freq: Two times a day (BID) | ORAL | Status: DC
  Administered 2018-02-09 – 2018-02-12 (×7): 4 mg via ORAL
  Filled 2018-02-09 (×7): qty 1

## 2018-02-09 MED ORDER — ISOSORBIDE DINITRATE 10 MG PO TABS
5.0000 mg | ORAL_TABLET | Freq: Three times a day (TID) | ORAL | Status: DC
  Administered 2018-02-09 – 2018-02-12 (×9): 5 mg via ORAL
  Filled 2018-02-09 (×9): qty 1

## 2018-02-09 MED ORDER — CARVEDILOL 12.5 MG PO TABS
12.5000 mg | ORAL_TABLET | Freq: Two times a day (BID) | ORAL | Status: DC
  Administered 2018-02-09 – 2018-02-12 (×6): 12.5 mg via ORAL
  Filled 2018-02-09 (×6): qty 1

## 2018-02-09 NOTE — Progress Notes (Signed)
TEAM 1 - Stepdown/ICU TEAM  John Jacobson  LKT:[id number] DOB: Feb 01, 1956 DOA: 02/07/2018 PCP: [facility], [facility]    Brief Narrative:  62 y.o.malewith a hx of hypertension, chronic grade 1 diastolic CHF, chronic kidney disease stage III, insulin-dependent diabetes mellitus, and CVA who presented with shortness of breath which had been building over 2 weeks, and pleuritic left lateral chest pain.  Subjective: Resting comfortably in bed.  Denies cp or n/v at this time.  SOB has now resolved.  No abdom pain, with a good appetite.    Assessment & Plan:  Acute on chronic diastolic CHF  No clinical evidence of signif volume overload at this time - sats normal on RA - w/ worsening of renal failure, will stop diuretic for now and folllow - will attempt to avoid volume expansion, but if crt continues to climb it may have to be considered   Filed Weights   02/07/18 2144 02/08/18 0358 02/09/18 0500  Weight: 81.3 kg 80.6 kg 78.4 kg    Chest pain  EKG unremarkable - troponin normal - pain pleuritic in nature - VQ w/o evidence of PE - now resolved - likely musculoskeletal in etiology   Acute renal failure superimposed on CKD stage III  Baseline crt ~2.30 as of June 2018 - crt steadily worsening - no indication for HD at this point - good UOP persists - no RAS via doppler May 2018 - stop lasix - follow - may require volume expansion   Recent Labs  Lab 02/07/18 1616 02/08/18 0016 02/09/18 0247  CREATININE 3.35* 3.43* 4.00*    Hypertension - Hypertensive urgency   BP remains poorly controlled - adjust tx - avoid ARB/ACEi  DM  managed at home with Lantus 10 units qHS and glipizide - CBG elevated - adjust tx and follow trend   History of L cerebellar CVA continue ASA, Plavix, and statin    Normocytic anemia Likely anemia of CKD - no evidence of blood loss    DVT prophylaxis: SQ heparin  Code Status: FULL CODE Family Communication: no family present at time  of exam  Disposition Plan: safe for tele bed - adjust BP meds - follow creatinine   Consultants:  none  Antimicrobials:  none   Objective: Blood pressure (!) 173/81, pulse (!) 53, temperature 97.8 F (36.6 C), temperature source Oral, resp. rate 17, height 5\' 7"  (1.702 m), weight 78.4 kg, SpO2 100 %.  Intake/Output Summary (Last 24 hours) at 02/09/2018 1128 Last data filed at 02/09/2018 3428 Gross per 24 hour  Intake 65 ml  Output 1325 ml  Net -1260 ml   Filed Weights   02/07/18 2144 02/08/18 0358 02/09/18 0500  Weight: 81.3 kg 80.6 kg 78.4 kg    Examination: General: No acute respiratory distress Lungs: Clear to auscultation bilaterally without wheezes or crackles Cardiovascular: Regular rate and rhythm without murmur gallop or rub normal S1 and S2 Abdomen: Nontender, nondistended, soft, bowel sounds positive, no rebound, no ascites, no appreciable mass Extremities: No significant cyanosis, clubbing, or edema bilateral lower extremities  CBC: Recent Labs  Lab 02/07/18 1616  WBC 11.9*  HGB 11.1*  HCT 34.3*  MCV 97.7  PLT 768   Basic Metabolic Panel: Recent Labs  Lab 02/07/18 1616 02/08/18 0016 02/09/18 0247  NA 141 142 138  K 3.7 4.0 3.7  CL 108 107 103  CO2 23 25 28   GLUCOSE 199* 191* 234*  BUN 39* 39* 50*  CREATININE 3.35* 3.43* 4.00*  CALCIUM 9.4 9.2 8.9  MG  --   --  1.8  PHOS  --   --  3.9   GFR: Estimated Creatinine Clearance: 17.7 mL/min (A) (by C-G formula based on SCr of 4 mg/dL (H)).  Liver Function Tests: Recent Labs  Lab 02/09/18 0247  ALBUMIN 3.4*    Cardiac Enzymes: Recent Labs  Lab 02/08/18 0016 02/08/18 0557  TROPONINI <0.03 <0.03    HbA1C: Hgb A1c MFr Bld  Date/Time Value Ref Range Status  11/03/2016 06:04 AM 8.6 (H) 4.8 - 5.6 % Final    Comment:    (NOTE)         Pre-diabetes: 5.7 - 6.4         Diabetes: >6.4         Glycemic control for adults with diabetes: <7.0   03/31/2016 03:29 AM 7.2 (H) 4.8 - 5.6 % Final     Comment:    (NOTE)         Pre-diabetes: 5.7 - 6.4         Diabetes: >6.4         Glycemic control for adults with diabetes: <7.0     CBG: Recent Labs  Lab 02/08/18 0805 02/08/18 1157 02/08/18 1758 02/08/18 2123 02/09/18 0830  GLUCAP 162* 235* 189* 291* 226*    Recent Results (from the past 240 hour(s))  MRSA PCR Screening     Status: None   Collection Time: 02/07/18 10:13 PM  Result Value Ref Range Status   MRSA by PCR NEGATIVE NEGATIVE Final    Comment:        The GeneXpert MRSA Assay (FDA approved for NASAL specimens only), is one component of a comprehensive MRSA colonization surveillance program. It is not intended to diagnose MRSA infection nor to guide or monitor treatment for MRSA infections. Performed at [facility], [place] [address] [place], [place] [address]      Scheduled Meds: . aspirin  325 mg Oral Daily  . atorvastatin  20 mg Oral q1800  . carvedilol  6.25 mg Oral BID WC  . cloNIDine  0.3 mg Oral TID  . famotidine  10 mg Oral Daily  . heparin  5,000 Units Subcutaneous Q8H  . hydrALAZINE  100 mg Oral TID  . hydroxypropyl methylcellulose / hypromellose  1 drop Both Eyes QID  . insulin aspart  0-5 Units Subcutaneous QHS  . insulin aspart  0-9 Units Subcutaneous TID WC  . sodium chloride flush  3 mL Intravenous Q12H   Continuous Infusions: . sodium chloride    . furosemide 120 mg (02/09/18 2248)     LOS: 1 day   Cherene Altes, MD [facility] Office  [phone] Pager - Text Page per Amion  If 7PM-7AM, please contact night-coverage per Amion 02/09/2018, 11:28 AM

## 2018-02-09 NOTE — Progress Notes (Signed)
Call received from April with [facility] transfer center- update provided- pt has primary care at [facility]. Social Worker is [employer], contact [phone] ext. [phone] or pager [phone]-

## 2018-02-09 NOTE — Progress Notes (Signed)
Inpatient Diabetes Program Recommendations  AACE/ADA: New Consensus Statement on Inpatient Glycemic Control (2015)  Target Ranges:  Prepandial:   less than 140 mg/dL      Peak postprandial:   less than 180 mg/dL (1-2 hours)      Critically ill patients:  140 - 180 mg/dL   Results for CLABORN, JANUSZ (MRN [id number]) as of 02/09/2018 08:59  Ref. Range 02/08/2018 08:05 02/08/2018 11:57 02/08/2018 17:58 02/08/2018 21:23 02/09/2018 08:30  Glucose-Capillary Latest Ref Range: 70 - 99 mg/dL 162 (H) 235 (H) 189 (H) 291 (H) 226 (H)   Review of Glycemic Control  Diabetes history: DM 2 Outpatient Diabetes medications: Lantus 16 units qhs Current orders for Inpatient glycemic control: Novolog 0-9 units qhs, Novolog 0-5 units qhs  Inpatient Diabetes Program Recommendations:    Glucose mostly in the upper 100-200's. Patient is on basal insulin at home. Please consider half of patient's basal insulin, Lantus 8 units.  Thanks,  Tama Headings RN, MSN, BC-ADM Inpatient Diabetes Coordinator Team Pager [phone] (8a-5p)

## 2018-02-10 ENCOUNTER — Inpatient Hospital Stay (HOSPITAL_COMMUNITY): Payer: Medicaid Other

## 2018-02-10 DIAGNOSIS — I5033 Acute on chronic diastolic (congestive) heart failure: Secondary | ICD-10-CM

## 2018-02-10 LAB — COMPREHENSIVE METABOLIC PANEL
ALBUMIN: 3.3 g/dL — AB (ref 3.5–5.0)
ALT: 14 U/L (ref 0–44)
AST: 16 U/L (ref 15–41)
Alkaline Phosphatase: 59 U/L (ref 38–126)
Anion gap: 11 (ref 5–15)
BUN: 67 mg/dL — ABNORMAL HIGH (ref 8–23)
CHLORIDE: 95 mmol/L — AB (ref 98–111)
CO2: 30 mmol/L (ref 22–32)
Calcium: 8.4 mg/dL — ABNORMAL LOW (ref 8.9–10.3)
Creatinine, Ser: 5.02 mg/dL — ABNORMAL HIGH (ref 0.61–1.24)
GFR calc Af Amer: 13 mL/min — ABNORMAL LOW (ref >60)
GFR calc non Af Amer: 11 mL/min — ABNORMAL LOW (ref >60)
GLUCOSE: 286 mg/dL — AB (ref 70–99)
POTASSIUM: 3.6 mmol/L (ref 3.5–5.1)
Sodium: 136 mmol/L (ref 135–145)
Total Bilirubin: 0.8 mg/dL (ref 0.3–1.2)
Total Protein: 6.6 g/dL (ref 6.5–8.1)

## 2018-02-10 LAB — CBC
HEMATOCRIT: 34 % — AB (ref 39.0–52.0)
Hemoglobin: 10.7 g/dL — ABNORMAL LOW (ref 13.0–17.0)
MCH: 31.1 pg (ref 26.0–34.0)
MCHC: 31.5 g/dL (ref 30.0–36.0)
MCV: 98.8 fL (ref 78.0–100.0)
Platelets: 197 10*3/uL (ref 150–400)
RBC: 3.44 MIL/uL — ABNORMAL LOW (ref 4.22–5.81)
RDW: 14.1 % (ref 11.5–15.5)
WBC: 12 10*3/uL — ABNORMAL HIGH (ref 4.0–10.5)

## 2018-02-10 LAB — GLUCOSE, CAPILLARY
Glucose-Capillary: 233 mg/dL — ABNORMAL HIGH (ref 70–99)
Glucose-Capillary: 297 mg/dL — ABNORMAL HIGH (ref 70–99)
Glucose-Capillary: 182 mg/dL — ABNORMAL HIGH (ref 70–99)
Glucose-Capillary: 287 mg/dL — ABNORMAL HIGH (ref 70–99)

## 2018-02-10 MED ORDER — SODIUM CHLORIDE 0.9 % IV SOLN
INTRAVENOUS | Status: DC
  Administered 2018-02-10: 09:00:00 via INTRAVENOUS

## 2018-02-10 MED ORDER — CLONIDINE HCL 0.2 MG PO TABS
0.3000 mg | ORAL_TABLET | Freq: Two times a day (BID) | ORAL | Status: DC
  Administered 2018-02-10 – 2018-02-12 (×4): 0.3 mg via ORAL
  Filled 2018-02-10 (×4): qty 1

## 2018-02-10 MED ORDER — SODIUM CHLORIDE 0.9 % IV SOLN
INTRAVENOUS | Status: AC
  Administered 2018-02-10: 13:00:00 via INTRAVENOUS

## 2018-02-10 MED ORDER — INSULIN GLARGINE 100 UNIT/ML ~~LOC~~ SOLN
20.0000 [IU] | Freq: Every day | SUBCUTANEOUS | Status: DC
  Administered 2018-02-10: 20 [IU] via SUBCUTANEOUS
  Filled 2018-02-10 (×2): qty 0.2

## 2018-02-10 NOTE — Progress Notes (Signed)
[place] TEAM 1 - Stepdown/ICU TEAM  SHALIK SANFILIPPO  ENI:[id number] DOB: 24-Jul-1955 DOA: 02/07/2018 PCP: [facility], [facility]    Brief Narrative:  62 y.o.malewith a hx of hypertension, chronic grade 1 diastolic CHF, chronic kidney disease stage III, insulin-dependent diabetes mellitus, and CVA who presented with shortness of breath which had been building over 2 weeks, and pleuritic left lateral chest pain.  Subjective:   Patient in bed, appears comfortable, denies any headache, no fever, no chest pain or pressure, no shortness of breath , no abdominal pain. No focal weakness.  Assessment & Plan:  Acute on chronic diastolic CHF EF 61% on recent echocardiogram -  this likely was precipitated by hypertensive crisis, was diuresed initially and now symptom-free, no shortness of breath, now in ARF, hold further diuretics.     Chest pain - now chest pain-free, EKG was unremarkable - troponin normal - pain pleuritic in nature - VQ w/o evidence of PE - now resolved - likely was musculoskeletal in etiology.  Acute renal failure superimposed on CKD stage 4 - on 2.5, he has good urine output, hold diuretic, leg blood pressure on slightly higher than normotensive levels and monitor creatinine.  No clinical indication for HD 8.   Hypertension - Hypertensive urgency  - blood pressure medications and discontinued high-dose hydralazine 3 times daily to allow systolic blood pressures to run between 140 and 160, avoid rapid correction to normotensive levels due to ARF.  DM -  managed at home with Lantus 10 units qHS and glipizide - CBG elevated -due to renal failure for now hold glipizide, have placed him on 20 of Lantus daily for better glycemic control.  CBG (last 3)  Recent Labs    02/09/18 2140 02/10/18 0827 02/10/18 1211  GLUCAP 272* 233* 297*     History of L cerebellar CVA  - continue ASA, Plavix, and statin.    Normocytic anemia - Likely anemia of CKD - no evidence of blood  loss, outpatient age-appropriate work-up by PCP.     DVT prophylaxis: SQ heparin  Code Status: FULL CODE Family Communication: no family present at time of exam  Disposition Plan: safe for tele bed - adjust BP meds - follow creatinine   Consultants:  none  Antimicrobials:  none   Objective: Blood pressure 140/76, pulse (!) 49, temperature (!) 97.3 F (36.3 C), temperature source Oral, resp. rate 13, height 5\' 7"  (1.702 m), weight 78.5 kg, SpO2 98 %.  Intake/Output Summary (Last 24 hours) at 02/10/2018 1231 Last data filed at 02/10/2018 0606 Gross per 24 hour  Intake 240 ml  Output 1375 ml  Net -1135 ml   Filed Weights   02/08/18 0358 02/09/18 0500 02/10/18 0605  Weight: 80.6 kg 78.4 kg 78.5 kg    Examination:  Awake Alert, Oriented X 3, No new F.N deficits, Normal affect .AT,PERRAL Supple Neck,No JVD, No cervical lymphadenopathy appriciated.  Symmetrical Chest wall movement, Good air movement bilaterally, CTAB RRR,No Gallops, Rubs or new Murmurs, No Parasternal Heave +ve B.Sounds, Abd Soft, No tenderness, No organomegaly appriciated, No rebound - guarding or rigidity. No Cyanosis, Clubbing or edema, No new Rash or bruise  CBC: Recent Labs  Lab 02/07/18 1616 02/10/18 0242  WBC 11.9* 12.0*  HGB 11.1* 10.7*  HCT 34.3* 34.0*  MCV 97.7 98.8  PLT 185 443   Basic Metabolic Panel: Recent Labs  Lab 02/08/18 0016 02/09/18 0247 02/10/18 0242  NA 142 138 136  K 4.0 3.7 3.6  CL  107 103 95*  CO2 25 28 30   GLUCOSE 191* 234* 286*  BUN 39* 50* 67*  CREATININE 3.43* 4.00* 5.02*  CALCIUM 9.2 8.9 8.4*  MG  --  1.8  --   PHOS  --  3.9  --    GFR: Estimated Creatinine Clearance: 14.1 mL/min (A) (by C-G formula based on SCr of 5.02 mg/dL (H)).  Liver Function Tests: Recent Labs  Lab 02/09/18 0247 02/10/18 0242  AST  --  16  ALT  --  14  ALKPHOS  --  59  BILITOT  --  0.8  PROT  --  6.6  ALBUMIN 3.4* 3.3*    Cardiac Enzymes: Recent Labs  Lab  02/08/18 0016 02/08/18 0557  TROPONINI <0.03 <0.03   CBG: Recent Labs  Lab 02/09/18 1154 02/09/18 1712 02/09/18 2140 02/10/18 0827 02/10/18 1211  GLUCAP 335* 96 272* 233* 297*    Scheduled Meds: . aspirin  325 mg Oral Daily  . atorvastatin  20 mg Oral q1800  . carvedilol  12.5 mg Oral BID WC  . cloNIDine  0.3 mg Oral TID  . doxazosin  4 mg Oral BID  . famotidine  10 mg Oral Daily  . heparin  5,000 Units Subcutaneous Q8H  . hydroxypropyl methylcellulose / hypromellose  1 drop Both Eyes BID  . insulin aspart  0-5 Units Subcutaneous QHS  . insulin aspart  0-9 Units Subcutaneous TID WC  . isosorbide dinitrate  5 mg Oral TID   Continuous Infusions: . sodium chloride       LOS: 2 days   Signature  Lala Lund M.D on 02/10/2018 at 12:31 PM  To page go to [email or web address] - password [facility]

## 2018-02-10 NOTE — Progress Notes (Signed)
MD notified re: high creatinine of 5.02.  See NO.  Patient is symptomatic and is asking appropriate questions about hypertensive and renal issues.

## 2018-02-10 NOTE — Plan of Care (Signed)
  Problem: Education: Goal: Knowledge of General Education information will improve Description Including pain rating scale, medication(s)/side effects and non-pharmacologic comfort measures Outcome: Completed/Met   Problem: Health Behavior/Discharge Planning: Goal: Ability to manage health-related needs will improve Outcome: Adequate for Discharge   Problem: Clinical Measurements: Goal: Ability to maintain clinical measurements within normal limits will improve Outcome: Adequate for Discharge Goal: Will remain free from infection Outcome: Adequate for Discharge Goal: Diagnostic test results will improve Outcome: Adequate for Discharge Goal: Respiratory complications will improve Outcome: Adequate for Discharge Goal: Cardiovascular complication will be avoided Outcome: Adequate for Discharge   Problem: Activity: Goal: Risk for activity intolerance will decrease Outcome: Adequate for Discharge   Problem: Nutrition: Goal: Adequate nutrition will be maintained Outcome: Adequate for Discharge   Problem: Coping: Goal: Level of anxiety will decrease Outcome: Adequate for Discharge   Problem: Elimination: Goal: Will not experience complications related to bowel motility Outcome: Adequate for Discharge Goal: Will not experience complications related to urinary retention Outcome: Completed/Met   Problem: Pain Managment: Goal: General experience of comfort will improve Outcome: Adequate for Discharge   Problem: Safety: Goal: Ability to remain free from injury will improve Outcome: Completed/Met   Problem: Skin Integrity: Goal: Risk for impaired skin integrity will decrease Outcome: Adequate for Discharge   Problem: Education: Goal: Ability to demonstrate management of disease process will improve Outcome: Adequate for Discharge Goal: Ability to verbalize understanding of medication therapies will improve Outcome: Adequate for Discharge Goal: Individualized  Educat Problem: Cardiac: Goal: Ability to achieve and maintain adequate cardiopulmonary perfusion will improve Outcome: Adequate for Discharge   Problem: Education: Goal: Individualized Educational Video(s) Outcome: Completed/Met   Problem: Education: Goal: Individualized Educational Video(s) Outcome: Completed/Met   Problem: Education: Goal: Ability to verbalize understanding of medication therapies will improve Outcome: Adequate for Discharge   Problem: Education: Goal: Ability to demonstrate management of disease process will improve Outcome: Adequate for Discharge Goal: Ability to verbalize understanding of medication therapies will improve Outcome: Adequate for Discharge Goal: Individualized Educational Video(s) Outcome: Completed/Met   Problem: Skin Integrity: Goal: Risk for impaired skin integrity will decrease Outcome: Adequate for Discharge  ional Video(s) Outcome: Completed/Met   Problem: Education: Goal: Ability to verbalize understanding of medication therapies will improve Outcome: Adequate for Discharge   Problem: Education: Goal: Individualized Educational Video(s) Outcome: Completed/Met

## 2018-02-11 ENCOUNTER — Inpatient Hospital Stay (HOSPITAL_COMMUNITY): Payer: Medicaid Other

## 2018-02-11 LAB — GLUCOSE, CAPILLARY
GLUCOSE-CAPILLARY: 246 mg/dL — AB (ref 70–99)
GLUCOSE-CAPILLARY: 295 mg/dL — AB (ref 70–99)
GLUCOSE-CAPILLARY: 257 mg/dL — AB (ref 70–99)
Glucose-Capillary: 230 mg/dL — ABNORMAL HIGH (ref 70–99)

## 2018-02-11 LAB — BASIC METABOLIC PANEL
Anion gap: 8 (ref 5–15)
BUN: 73 mg/dL — ABNORMAL HIGH (ref 8–23)
CALCIUM: 8 mg/dL — AB (ref 8.9–10.3)
CHLORIDE: 100 mmol/L (ref 98–111)
CO2: 27 mmol/L (ref 22–32)
CREATININE: 4.9 mg/dL — AB (ref 0.61–1.24)
GFR calc non Af Amer: 11 mL/min — ABNORMAL LOW (ref >60)
GFR, EST AFRICAN AMERICAN: 13 mL/min — AB (ref >60)
Glucose, Bld: 284 mg/dL — ABNORMAL HIGH (ref 70–99)
Potassium: 3.9 mmol/L (ref 3.5–5.1)
SODIUM: 135 mmol/L (ref 135–145)

## 2018-02-11 LAB — BRAIN NATRIURETIC PEPTIDE: B Natriuretic Peptide: 34.6 pg/mL (ref 0.0–100.0)

## 2018-02-11 MED ORDER — FUROSEMIDE 10 MG/ML IJ SOLN
40.0000 mg | Freq: Once | INTRAMUSCULAR | Status: AC
  Administered 2018-02-11: 40 mg via INTRAVENOUS
  Filled 2018-02-11: qty 4

## 2018-02-11 MED ORDER — INSULIN GLARGINE 100 UNIT/ML ~~LOC~~ SOLN
30.0000 [IU] | Freq: Every day | SUBCUTANEOUS | Status: DC
  Administered 2018-02-11: 30 [IU] via SUBCUTANEOUS
  Filled 2018-02-11 (×2): qty 0.3

## 2018-02-11 NOTE — Progress Notes (Signed)
Patient complaining of dyspnea at this time.  Lungs CTA.  Upper airway wheezing auscultated to deep breathing.  Nebulizer treatment given.

## 2018-02-11 NOTE — Progress Notes (Signed)
Dr Candiss Norse returned call.  40 mg IV Lasix ordered.

## 2018-02-11 NOTE — Progress Notes (Signed)
John Jacobson TEAM 1 - Stepdown/ICU TEAM  John Jacobson  QIO:[id number] DOB: 1956-04-25 DOA: 02/07/2018 PCP: Care, John [facility]son [facility]    Brief Narrative:  62 y.o.malewith a hx of hypertension, chronic grade 1 diastolic CHF, chronic kidney disease stage III, insulin-dependent diabetes mellitus, and CVA who presented with shortness of breath which had been building over 2 weeks, and pleuritic left lateral chest pain.  Subjective:   Patient in bed, appears comfortable, denies any headache, no fever, no chest pain or pressure, does have mild shortness of breath , no abdominal pain. No focal weakness.   Assessment & Plan:  Acute on chronic diastolic CHF EF 32% on recent echocardiogram -  this likely was precipitated by hypertensive crisis, he is again mildly short of breath and has Rales on exam, repeat Lasix cautiously on 02/11/2018 and monitor, will also obtain a two-view chest x-ray.  Oxygen and nebulizer treatments as needed.  Chest pain - now chest pain-free, EKG was unremarkable - troponin normal - pain pleuritic in nature - VQ w/o evidence of PE - now resolved - likely was musculoskeletal in etiology.  Acute renal failure superimposed on CKD stage 4 -says his last creatinine was around 3, he goes to the local [facility], he has been told that he will end up with dialysis soon and he was due to see a vascular surgeon soon for access placement, currently he has good urine output, will avoid rapid drop in blood pressure, repeat Lasix due to #1 above on 02/11/2018, monitor electrolytes and BMP, currently no indication for dialysis.   Hypertension - Hypertensive urgency  - blood pressure medications and discontinued high-dose hydralazine 3 times daily to allow systolic blood pressures to run between 140 and 160, avoid rapid correction to normotensive levels due to ARF.  DM -  managed at home with Lantus 10 units qHS and glipizide - CBG elevated -due to renal failure for now hold glipizide, have been  placed on Lantus here will adjust dose for better control.  CBG (last 3)  Recent Labs    02/10/18 1711 02/10/18 2121 02/11/18 0825  GLUCAP 182* 287* 246*     History of L cerebellar CVA  - continue ASA, Plavix, and statin.    Normocytic anemia - Likely anemia of CKD - no evidence of blood loss, outpatient age-appropriate work-up by PCP.     DVT prophylaxis: SQ heparin  Code Status: FULL CODE Family Communication: no family present at time of exam  Disposition Plan: safe for tele bed - adjust BP meds - follow creatinine   Consultants:  none  Antimicrobials:  none   Objective: Blood pressure 137/80, pulse 64, temperature 97.7 F (36.5 C), temperature source Oral, resp. rate 12, height 5\' 7"  (1.702 m), weight 80.8 kg, SpO2 100 %.  Intake/Output Summary (Last 24 hours) at 02/11/2018 1127 Last data filed at 02/11/2018 0830 Gross per 24 hour  Intake 900.63 ml  Output 1400 ml  Net -499.37 ml   Filed Weights   02/09/18 0500 02/10/18 0605 02/11/18 0458  Weight: 78.4 kg 78.5 kg 80.8 kg    Examination:  Awake Alert, Oriented X 3, No new F.N deficits, Normal affect [place].AT,PERRAL Supple Neck,No JVD, No cervical lymphadenopathy appriciated.  Symmetrical Chest wall movement, Good air movement bilaterally, +ve rales RRR,No Gallops, Rubs or new Murmurs, No Parasternal Heave +ve B.Sounds, Abd Soft, No tenderness, No organomegaly appriciated, No rebound - guarding or rigidity. No Cyanosis, Clubbing or edema, No new Rash or bruise  CBC:  Recent Labs  Lab 02/07/18 1616 02/10/18 0242  WBC 11.9* 12.0*  HGB 11.1* 10.7*  HCT 34.3* 34.0*  MCV 97.7 98.8  PLT 185 568   Basic Metabolic Panel: Recent Labs  Lab 02/09/18 0247 02/10/18 0242 02/11/18 0237  NA 138 136 135  K 3.7 3.6 3.9  CL 103 95* 100  CO2 28 30 27   GLUCOSE 234* 286* 284*  BUN 50* 67* 73*  CREATININE 4.00* 5.02* 4.90*  CALCIUM 8.9 8.4* 8.0*  MG 1.8  --   --   PHOS 3.9  --   --    GFR: Estimated  Creatinine Clearance: 15.7 mL/min (A) (by C-G formula based on SCr of 4.9 mg/dL (H)).  Liver Function Tests: Recent Labs  Lab 02/09/18 0247 02/10/18 0242  AST  --  16  ALT  --  14  ALKPHOS  --  59  BILITOT  --  0.8  PROT  --  6.6  ALBUMIN 3.4* 3.3*    Cardiac Enzymes: Recent Labs  Lab 02/08/18 0016 02/08/18 0557  TROPONINI <0.03 <0.03   CBG: Recent Labs  Lab 02/10/18 0827 02/10/18 1211 02/10/18 1711 02/10/18 2121 02/11/18 0825  GLUCAP 233* 297* 182* 287* 246*    Scheduled Meds: . aspirin  325 mg Oral Daily  . atorvastatin  20 mg Oral q1800  . carvedilol  12.5 mg Oral BID WC  . cloNIDine  0.3 mg Oral BID  . doxazosin  4 mg Oral BID  . famotidine  10 mg Oral Daily  . furosemide  40 mg Intravenous Once  . heparin  5,000 Units Subcutaneous Q8H  . hydroxypropyl methylcellulose / hypromellose  1 drop Both Eyes BID  . insulin aspart  0-5 Units Subcutaneous QHS  . insulin aspart  0-9 Units Subcutaneous TID WC  . insulin glargine  20 Units Subcutaneous Daily  . isosorbide dinitrate  5 mg Oral TID   Continuous Infusions:    LOS: 3 days   Signature  Lala Lund M.D on 02/11/2018 at 11:27 AM  To page go to [email or web address] - password [facility]

## 2018-02-12 LAB — BASIC METABOLIC PANEL
ANION GAP: 10 (ref 5–15)
BUN: 71 mg/dL — ABNORMAL HIGH (ref 8–23)
CALCIUM: 8.3 mg/dL — AB (ref 8.9–10.3)
CO2: 27 mmol/L (ref 22–32)
Chloride: 102 mmol/L (ref 98–111)
Creatinine, Ser: 4.57 mg/dL — ABNORMAL HIGH (ref 0.61–1.24)
GFR, EST AFRICAN AMERICAN: 14 mL/min — AB (ref >60)
GFR, EST NON AFRICAN AMERICAN: 12 mL/min — AB (ref >60)
GLUCOSE: 302 mg/dL — AB (ref 70–99)
POTASSIUM: 4.1 mmol/L (ref 3.5–5.1)
Sodium: 139 mmol/L (ref 135–145)

## 2018-02-12 LAB — CBC
HCT: 31.6 % — ABNORMAL LOW (ref 39.0–52.0)
HEMOGLOBIN: 10.2 g/dL — AB (ref 13.0–17.0)
MCH: 31.7 pg (ref 26.0–34.0)
MCHC: 32.3 g/dL (ref 30.0–36.0)
MCV: 98.1 fL (ref 78.0–100.0)
PLATELETS: 172 10*3/uL (ref 150–400)
RBC: 3.22 MIL/uL — AB (ref 4.22–5.81)
RDW: 13.7 % (ref 11.5–15.5)
WBC: 11.9 10*3/uL — AB (ref 4.0–10.5)

## 2018-02-12 LAB — GLUCOSE, CAPILLARY
GLUCOSE-CAPILLARY: 114 mg/dL — AB (ref 70–99)
Glucose-Capillary: 248 mg/dL — ABNORMAL HIGH (ref 70–99)

## 2018-02-12 LAB — MAGNESIUM: Magnesium: 2.1 mg/dL (ref 1.7–2.4)

## 2018-02-12 MED ORDER — INSULIN GLARGINE 100 UNITS/ML SOLOSTAR PEN: 30.0000 [IU] | PEN_INJECTOR | Freq: Every day | SUBCUTANEOUS | 11 refills | Status: DC

## 2018-02-12 MED ORDER — ISOSORBIDE MONONITRATE ER 60 MG PO TB24: 60.0000 mg | ORAL_TABLET | Freq: Every day | ORAL | 0 refills | Status: DC

## 2018-02-12 MED ORDER — POTASSIUM CHLORIDE ER 10 MEQ PO TBCR: 10.0000 meq | EXTENDED_RELEASE_TABLET | Freq: Every day | ORAL | 0 refills | Status: DC

## 2018-02-12 MED ORDER — INSULIN GLARGINE 100 UNIT/ML ~~LOC~~ SOLN
30.0000 [IU] | Freq: Every day | SUBCUTANEOUS | Status: DC
  Administered 2018-02-12: 30 [IU] via SUBCUTANEOUS

## 2018-02-12 MED ORDER — FUROSEMIDE 40 MG PO TABS: 20.0000 mg | ORAL_TABLET | Freq: Every day | ORAL | 0 refills | Status: DC

## 2018-02-12 NOTE — Discharge Instructions (Signed)
Follow with Primary MD Care, [facility] in 7 days, also follow with your nephrologist within a week.  Get CBC, CMP, 2 view Chest X ray checked  by Primary MD  in 5-7 days  Activity: As tolerated with Full fall precautions use walker/cane & assistance as needed  Disposition Home    Diet:   Heart Healthy Low Carb, 1.5 L/day total fluid restriction.  Accuchecks 4 times/day, Once in AM empty stomach and then before each meal. Log in all results and show them to your Prim.MD in 3 days. If any glucose reading is under 80 or above 300 call your Prim MD immidiately. Follow Low glucose instructions for glucose under 80 as instructed.   For Heart failure patients - Check your Weight same time everyday, if you gain over 2 pounds, or you develop in leg swelling, experience more shortness of breath or chest pain, call your Primary MD immediately. Follow Cardiac Low Salt Diet and 1.5 lit/day fluid restriction.  Special Instructions: If you have smoked or chewed Tobacco  in the last 2 yrs please stop smoking, stop any regular Alcohol  and or any Recreational drug use.  On your next visit with your primary care physician please Get Medicines reviewed and adjusted.  Please request your Prim.MD to go over all Hospital Tests and Procedure/Radiological results at the follow up, please get all Hospital records sent to your Prim MD by signing hospital release before you go home.  If you experience worsening of your admission symptoms, develop shortness of breath, life threatening emergency, suicidal or homicidal thoughts you must seek medical attention immediately by calling 911 or calling your MD immediately  if symptoms less severe.  You Must read complete instructions/literature along with all the possible adverse reactions/side effects for all the Medicines you take and that have been prescribed to you. Take any new Medicines after you have completely understood and accpet all the possible  adverse reactions/side effects.

## 2018-02-12 NOTE — Discharge Summary (Signed)
John Jacobson [id number] DOB: May 25, 1956 DOA: 02/07/2018  PCP: [facility]  Admit date: 02/07/2018  Discharge date: 02/12/2018  Admitted From: Home   Disposition:  Home   Recommendations for Outpatient Follow-up:   Follow up with PCP in 1-2 weeks  PCP Please obtain BMP/CBC, 2 view CXR in 1week,  (see Discharge instructions)   PCP Please follow up on the following pending results: Monitor renal function closely   Home Health: None   Equipment/Devices: None  Consultations: None Discharge Condition: Fair   CODE STATUS: Full   Diet Recommendation: Heart Healthy low carbohydrate, 1.5 L fluid restriction per day.   Chief Complaint  Patient presents with  . Chest Pain  . Shortness of Breath     Brief history of present illness from the day of admission and additional interim summary    62 y.o.malewith a hx of hypertension,chronic grade 1 diastolic CHF, chronic kidney disease stage III, insulin-dependent diabetes mellitus, and CVA who presented with shortness of breath which had been building over 2 weeks, and pleuritic left lateral chest pain.                                                                  Hospital Course   Acute on chronic diastolic CHF EF 50% on recent echocardiogram-  this likely was precipitated by hypertensive crisis,  he was diuresed gently with Lasix, shortness of breath has resolved, no rales on exam, he is now compensated with a stable two-view chest x-ray.  On room air at baseline will be discharged home on low-dose Lasix.  PCP to monitor BMP and diuretic dose closely.  Chest pain- now chest pain-free, EKG was unremarkable - troponin normal - pain pleuritic in nature - VQ w/o evidence of PE - now resolved - likely was musculoskeletal in etiology.  Acute  renal failure superimposed on CKD stage 5 -says his last creatinine was around 3, he goes to the local [facility], he has been told that he will end up with dialysis soon and he was due to see a vascular surgeon soon for access placement, currently he has good urine output, will avoid rapid drop in blood pressure, his renal function has improved after initial ARF due to likely hypertensive crisis and CHF decompensation, he is progressing towards ESRD rapidly has been advised to follow with his nephrologist within a week and keep his renal function monitored closely.   Hypertension - Hypertensive urgency- he claims he was compliant with his blood pressure medications, medications have been adjusted in the light of his ARF and CKD stage V, I have discontinued his ARB, PCP to monitor and adjust as needed.  DM 2-  managed at home with Lantus 10 units qHS dose adjusted for better control, requested to check Accu-Cheks QA  CHS and to be monitored by PCP.  CBG (last 3)  Recent Labs    02/11/18 1723 02/11/18 2134 02/12/18 0731  GLUCAP 257* 230* 248*    History of L cerebellar CVA  - continue ASA, Plavix, and statin.  Normocytic anemia - Likely anemia of CKD - no evidence of blood loss, outpatient age-appropriate work-up by PCP.   Discharge diagnosis     Principal Problem:   Acute on chronic diastolic CHF (congestive heart failure) (HCC) Active Problems:   Chest pain   DM (diabetes mellitus), type 2 with renal complications (HCC)   Hypertensive urgency   History of stroke   Acute renal failure superimposed on stage 3 chronic kidney disease (HCC)   Hypertension   Acute diastolic CHF (congestive heart failure) ([facility])    Discharge instructions    Discharge Instructions    Diet - low sodium heart healthy   Complete by:  As directed    Discharge instructions   Complete by:  As directed    Follow with Primary MD [facility] in 7 days, also follow with your nephrologist  within a week.  Get CBC, CMP, 2 view Chest X ray checked  by Primary MD  in 5-7 days  Activity: As tolerated with Full fall precautions use walker/cane & assistance as needed  Disposition Home    Diet:   Heart Healthy Low Carb, 1.5 L/day total fluid restriction.  Accuchecks 4 times/day, Once in AM empty stomach and then before each meal. Log in all results and show them to your Prim.MD in 3 days. If any glucose reading is under 80 or above 300 call your Prim MD immidiately. Follow Low glucose instructions for glucose under 80 as instructed.   For Heart failure patients - Check your Weight same time everyday, if you gain over 2 pounds, or you develop in leg swelling, experience more shortness of breath or chest pain, call your Primary MD immediately. Follow Cardiac Low Salt Diet and 1.5 lit/day fluid restriction.  Special Instructions: If you have smoked or chewed Tobacco  in the last 2 yrs please stop smoking, stop any regular Alcohol  and or any Recreational drug use.  On your next visit with your primary care physician please Get Medicines reviewed and adjusted.  Please request your Prim.MD to go over all Hospital Tests and Procedure/Radiological results at the follow up, please get all Hospital records sent to your Prim MD by signing hospital release before you go home.  If you experience worsening of your admission symptoms, develop shortness of breath, life threatening emergency, suicidal or homicidal thoughts you must seek medical attention immediately by calling 911 or calling your MD immediately  if symptoms less severe.  You Must read complete instructions/literature along with all the possible adverse reactions/side effects for all the Medicines you take and that have been prescribed to you. Take any new Medicines after you have completely understood and accpet all the possible adverse reactions/side effects.   Increase activity slowly   Complete by:  As directed        Discharge Medications   Allergies as of 02/12/2018      Reactions   Amlodipine Besy-benazepril Hcl Anaphylaxis, Shortness Of Breath, Swelling   Mouth and tongue swelling   Shellfish Allergy Anaphylaxis, Shortness Of Breath, Swelling      Medication List    STOP taking these medications   Carboxymethylcellulose Sod PF 0.25 % Soln   chlorthalidone 25 MG tablet Commonly known as:  HYGROTON   losartan 100 MG tablet Commonly known as:  COZAAR     TAKE these medications   acetaminophen 500 MG tablet Commonly known as:  TYLENOL Take 500 mg by mouth 2 (two) times daily as needed (for pain or headaches).   aspirin 325 MG EC tablet Take 1 tablet (325 mg total) by mouth daily.   carvedilol 25 MG tablet Commonly known as:  COREG Take 12.5 mg by mouth every 12 (twelve) hours.   cloNIDine 0.3 MG tablet Commonly known as:  CATAPRES Take 1 tablet (0.3 mg total) by mouth 3 (three) times daily.   doxazosin 8 MG tablet Commonly known as:  CARDURA Take 4 mg by mouth 2 (two) times daily.   eucerin cream Apply 1 application topically See admin instructions. Apply to both feet daily after showering   ferrous sulfate 325 (65 FE) MG tablet Take 325 mg by mouth every Monday, Wednesday, and Friday.   furosemide 40 MG tablet Commonly known as:  LASIX Take 0.5 tablets (20 mg total) by mouth daily.   glucose 4 GM chewable tablet Chew 1 tablet by mouth daily as needed for low blood sugar.   hydrALAZINE 100 MG tablet Commonly known as:  APRESOLINE Take 1 tablet (100 mg total) by mouth 3 (three) times daily.   insulin glargine 100 unit/mL Sopn Commonly known as:  LANTUS Inject 0.3 mLs (30 Units total) into the skin at bedtime. What changed:  how much to take   isosorbide mononitrate 60 MG 24 hr tablet Commonly known as:  IMDUR Take 1 tablet (60 mg total) by mouth daily.   polyethylene glycol packet Commonly known as:  MIRALAX / GLYCOLAX Take 17 g by mouth daily. What changed:     when to take this  reasons to take this   potassium chloride 10 MEQ tablet Commonly known as:  K-DUR Take 1 tablet (10 mEq total) by mouth daily.   senna 8.6 MG Tabs tablet Commonly known as:  SENOKOT Take 1 tablet (8.6 mg total) by mouth daily. What changed:    when to take this  reasons to take this   VITAMIN B-12 PO Take 1 tablet by mouth daily.   Vitamin D3 2000 units Tabs Take 4,000 Units by mouth daily.       Follow-up Information    [facility]. Schedule an appointment as soon as possible for a visit in 1 week(s).   Specialty:  Family Medicine Why:  Also with your nephrologist at [facility] within a week Contact information: [address] [place] [address] [phone]           Major procedures and Radiology Reports - PLEASE review detailed and final reports thoroughly  -       Dg Chest 2 View  Result Date: 02/11/2018 CLINICAL DATA:  Shortness of breath EXAM: CHEST - 2 VIEW COMPARISON:  02/08/2018 FINDINGS: Borderline heart size accentuated by technique. There is no edema, consolidation, effusion, or pneumothorax. No acute osseous finding IMPRESSION: Low volume chest without acute finding when compared to prior. Electronically Signed   By: Monte Fantasia M.D.   On: 02/11/2018 14:19   Dg Chest 2 View  Result Date: 02/08/2018 CLINICAL DATA:  Mild shortness of breath, hypertension, diabetes, former smoking history EXAM: CHEST - 2 VIEW COMPARISON:  Chest x-ray of 02/07/2018 FINDINGS: The mild pulmonary vascular congestion has largely cleared. No pneumonia or effusion is seen. Mild cardiomegaly is stable. No acute  bony abnormality is noted. Mild thoracolumbar scoliosis is present. IMPRESSION: Interval improvement in pulmonary vascular congestion. Stable mild cardiomegaly. Electronically Signed   By: Ivar Drape M.D.   On: 02/08/2018 17:09   Dg Chest 2 View  Result Date: 02/07/2018 CLINICAL DATA:  Left-sided chest  tightness x2 weeks. EXAM: CHEST - 2 VIEW COMPARISON:  11/02/2016 CXR FINDINGS: Stable cardiomegaly. Nonaneurysmal thoracic aorta. Gentle dextroconvex curvature of the thoracic spine is noted. There is central vascular congestion without alveolar consolidation, effusion or pneumothorax. Degenerative changes are present along the dorsal spine and both shoulders. IMPRESSION: Cardiomegaly with mild pulmonary vascular congestion suspicious for mild CHF. Electronically Signed   By: Ashley Royalty M.D.   On: 02/07/2018 16:51   US Renal  Result Date: 02/10/2018 CLINICAL DATA:  Acute renal failure EXAM: RENAL / URINARY TRACT ULTRASOUND COMPLETE COMPARISON:  Ultrasound 03/30/2016 FINDINGS: Right Kidney: Length: 10.4 cm. Echogenicity within normal limits. No mass or hydronephrosis visualized. Left Kidney: Length: 10.5 cm. Echogenicity within normal limits. No mass or hydronephrosis visualized. Bladder: Appears normal for degree of bladder distention. IMPRESSION: Normal renal ultrasound Electronically Signed   By: Franchot Gallo M.D.   On: 02/10/2018 15:40   Nm Pulmonary Perf And Vent  Result Date: 02/08/2018 CLINICAL DATA:  Chest discomfort, shortness of breath EXAM: NUCLEAR MEDICINE VENTILATION - PERFUSION LUNG SCAN TECHNIQUE: Ventilation images were obtained in multiple projections using inhaled aerosol Tc-32m DTPA. Perfusion images were obtained in multiple projections after intravenous injection of Tc-84m-MAA. RADIOPHARMACEUTICALS:  31.1 mCi of Tc-20m DTPA aerosol inhalation and 4.3 mCi Tc73m-MAA IV COMPARISON:  Chest x-ray 02/08/2018 FINDINGS: Ventilation: No focal ventilation defect. Perfusion: No wedge shaped peripheral perfusion defects to suggest acute pulmonary embolism. IMPRESSION: No evidence of pulmonary embolus. Electronically Signed   By: Rolm Baptise M.D.   On: 02/08/2018 19:46    Micro Results     Recent Results (from the past 240 hour(s))  MRSA PCR Screening     Status: None   Collection Time:  02/07/18 10:13 PM  Result Value Ref Range Status   MRSA by PCR NEGATIVE NEGATIVE Final    Comment:        The GeneXpert MRSA Assay (FDA approved for NASAL specimens only), is one component of a comprehensive MRSA colonization surveillance program. It is not intended to diagnose MRSA infection nor to guide or monitor treatment for MRSA infections. Performed at [facility], [place] [address], [place], [place] [address]     Today   Subjective   John Jacobson today has no headache,no chest abdominal pain,no new weakness tingling or numbness, feels much better wants to go home today.     Objective   Blood pressure (!) 165/77, pulse 67, temperature 98.1 F (36.7 C), temperature source Oral, resp. rate 17, height 5\' 7"  (1.702 m), weight 75.3 kg, SpO2 100 %.   Intake/Output Summary (Last 24 hours) at 02/12/2018 1012 Last data filed at 02/12/2018 0800 Gross per 24 hour  Intake 702 ml  Output 2825 ml  Net -2123 ml    Exam  Awake Alert, Oriented x 3, No new F.N deficits, Normal affect [place].AT,PERRAL Supple Neck,No JVD, No cervical lymphadenopathy appriciated.  Symmetrical Chest wall movement, Good air movement bilaterally, CTAB RRR,No Gallops,Rubs or new Murmurs, No Parasternal Heave +ve B.Sounds, Abd Soft, Non tender, No organomegaly appriciated, No rebound -guarding or rigidity. No Cyanosis, Clubbing or edema, No new Rash or bruise.    Data Review   CBC w Diff:  Lab Results  Component Value Date   WBC 11.9 (H) 02/12/2018   HGB 10.2 (L) 02/12/2018   HCT 31.6 (L) 02/12/2018   PLT 172 02/12/2018   LYMPHOPCT 32 11/14/2016   MONOPCT 11 11/14/2016   EOSPCT 4 11/14/2016   BASOPCT 0 11/14/2016    CMP:  Lab Results  Component Value Date   NA 139 02/12/2018   K 4.1 02/12/2018   CL 102 02/12/2018   CO2 27 02/12/2018   BUN 71 (H) 02/12/2018   CREATININE 4.57 (H) 02/12/2018   PROT 6.6 02/10/2018   ALBUMIN 3.3 (L) 02/10/2018   BILITOT 0.8 02/10/2018   ALKPHOS 59  02/10/2018   AST 16 02/10/2018   ALT 14 02/10/2018  .  Total Time in preparing paper work, data evaluation and todays exam - 72 minutes  Lala Lund M.D on 02/12/2018 at 10:12 AM  [facility]   Office  [phone]

## 2018-02-12 NOTE — Progress Notes (Signed)
Removed PIV access and pt received discharge instructions with prescriptions. Pt's nephew came for picking him up. Pt took his all belongings. HS [employer]

## 2018-02-12 NOTE — Care Management Note (Signed)
Case Management Note  Patient Details  Name: SEAB AXEL MRN: [id number] Date of Birth: 12-14-1955  Subjective/Objective:  Pt admitted on 02/07/18 with chest pain, hypertension and congestive heart failure.  PTA, pt independent, lives alone.                  Action/Plan: Pt medically stable for dc home today.  He states he has assistance with from others with ADLS,if he needs it.  He is a English as a second language teacher, and has a PCP at [facility] in [place].  He is also seen at the [facility] in [place].  He states he has no issues getting his meds, as they are mailed to him for free from the [place].  Pt denies any needs for home, other than a new BP monitor, which he plans to discuss with his [place] physician at his next appt.    Expected Discharge Date:  02/12/18               Expected Discharge Plan:  Home/Self Care  In-House Referral:     Discharge planning Services  CM Consult  Post Acute Care Choice:    Choice offered to:     DME Arranged:    DME Agency:     HH Arranged:    HH Agency:     Status of Service:  Completed, signed off  If discussed at [employer] of Stay Meetings, dates discussed:    Additional Comments:  Ella Bodo, RN 02/12/2018, 12:34 PM

## 2018-03-06 ENCOUNTER — Telehealth: Payer: Self-pay

## 2018-03-06 NOTE — Telephone Encounter (Signed)
Sent referral to scheduling and filed notes 

## 2018-04-04 ENCOUNTER — Institutional Professional Consult (permissible substitution): Payer: Medicaid Other | Admitting: Neurology

## 2018-04-04 ENCOUNTER — Telehealth: Payer: Self-pay

## 2018-04-04 NOTE — Telephone Encounter (Signed)
Patient no show for appt today for new pt appt.

## 2018-04-05 ENCOUNTER — Encounter: Payer: Self-pay | Admitting: Neurology

## 2018-04-14 ENCOUNTER — Ambulatory Visit (HOSPITAL_COMMUNITY)
Admission: EM | Admit: 2018-04-14 | Discharge: 2018-04-14 | Disposition: A | Discharge status: Home / Self Care | Payer: Medicare (Managed Care) | Attending: Emergency Medicine | Admitting: Emergency Medicine

## 2018-04-14 ENCOUNTER — Encounter (HOSPITAL_COMMUNITY): Payer: Self-pay | Admitting: Emergency Medicine

## 2018-04-14 ENCOUNTER — Other Ambulatory Visit: Payer: Self-pay

## 2018-04-14 DIAGNOSIS — K047 Periapical abscess without sinus: Secondary | ICD-10-CM

## 2018-04-14 MED ORDER — HYDROCODONE-ACETAMINOPHEN 5-325 MG PO TABS: 1.0000 | ORAL_TABLET | Freq: Four times a day (QID) | ORAL | 0 refills | Status: DC | PRN

## 2018-04-14 MED ORDER — AMOXICILLIN-POT CLAVULANATE 875-125 MG PO TABS: 1.0000 | ORAL_TABLET | Freq: Two times a day (BID) | ORAL | 0 refills | Status: DC

## 2018-04-14 NOTE — Discharge Instructions (Signed)
Please use dental resource to contact offices to seek permenant treatment/relief.   Today we have given you an antibiotic. This should help with pain as any infection is cleared.   For pain please take 600mg -800mg  of Ibuprofen every 8 hours, take with 1000 mg of Tylenol Extra strength every 8 hours. These are safe to take together. Please take with food.   I have also provided 2 days worth of stronger pain medication. This should only be used for severe pain. Do not drive or operate machinery while taking this medication.   Please return if you start to experience significant swelling of your face, experiencing fever.

## 2018-04-14 NOTE — ED Triage Notes (Signed)
The patient presented to the [facility] with a complaint of dental pain x3 days that he believed to possibly be an abscess on his gums.

## 2018-04-15 NOTE — ED Provider Notes (Signed)
[facility]    CSN: [id number] Arrival date & time: 04/14/18  1237     History   Chief Complaint Chief Complaint  Patient presents with  . Dental Pain    HPI John Jacobson is a 62 y.o. male history of CKD, CHF, DM type II, asthma, previous stroke presenting today for evaluation of possible dental abscess.  Patient states that for the past 3 days he has had worsening pain and swelling to his right lower jaw.  It is causing him headaches.  Denies any drainage.  States that he has had difficulty eating because of this.  He has tried salt water and Tylenol without relief.  States that he has plans to follow-up with dentistry next week.  Denies difficulty moving neck or neck swelling.  Denies difficulty swallowing.  Denies fevers.  Patient has taken his blood pressure today, but has 2 more doses, denies headaches, changes in vision, chest pain or shortness of breath.  HPI  Past Medical History:  Diagnosis Date  . Asthma   . Back pain   . CHF (congestive heart failure) ([place])   . Chronic kidney disease   . DM (diabetes mellitus) ([place])   . Hepatitis C   . HTN (hypertension)   . RBBB (right bundle branch block)   . Stroke [facility])     Patient Active Problem List   Diagnosis Date Noted  . Acute diastolic CHF (congestive heart failure) ([place]) 02/08/2018  . Acute renal failure superimposed on stage 3 chronic kidney disease ([place]) 02/07/2018  . Hypertension 02/07/2018  . Stroke ([place]) 11/15/2016  . History of stroke   . Labile blood glucose   . Hepatitis C virus infection without hepatic coma   . AKI (acute kidney injury) ([place])   . Brainstem infarct, acute ([place])   . Benign essential HTN   . Hypokalemia   . Leukocytosis   . Acute blood loss anemia   . 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 Hyperlipidemia   . Gait disturbance, post-stroke   . CKD (chronic kidney disease),  stage III ([place]) 11/02/2016  . Normocytic anemia 11/02/2016  . Ataxia 11/02/2016  . Ischemic stroke ([place]) 11/02/2016  . Puncture wound of right foot 11/02/2016  . Acute on chronic diastolic CHF (congestive heart failure) ([place]) 11/02/2016  . Chronic left-sided low back pain 11/02/2016  . Demand ischemia ([place])   . ARF (acute renal failure) ([place])   . Hypertensive urgency 03/29/2016  . Abnormal electrocardiogram 05/24/2011  . Chest pain 05/24/2011  . Tobacco abuse 05/24/2011  . DM (diabetes mellitus), type 2 with renal complications ([place]) 85/63/1497    Past Surgical History:  Procedure Laterality Date  . BACK SURGERY  1995  . TOE AMPUTATION  2016       Home Medications    Prior to Admission medications   Medication Sig Start Date End Date Taking? Authorizing Provider  acetaminophen (TYLENOL) 500 MG tablet Take 500 mg by mouth 2 (two) times daily as needed (for pain or headaches).    [provider]  amoxicillin-clavulanate (AUGMENTIN) 875-125 MG tablet Take 1 tablet by mouth every 12 (twelve) hours. 04/14/18   Dylon Correa C, PA-C  aspirin EC 325 MG EC tablet Take 1 tablet (325 mg total) by mouth daily. 11/05/16   Reyne Dumas, MD  atorvastatin (LIPITOR) 20 MG tablet Take 20 mg by mouth daily.    [provider]  carvedilol (COREG) 25 MG tablet Take 12.5 mg by mouth every 12 (twelve) hours.    [provider]  Cholecalciferol (VITAMIN D3) 2000 units TABS Take 4,000 Units by mouth daily.    [provider]  cloNIDine (CATAPRES) 0.3 MG tablet Take 1 tablet (0.3 mg total) by mouth 3 (three) times daily. 11/11/16   Angiulli, Lavon Paganini, PA-C  clopidogrel (PLAVIX) 75 MG tablet Take 75 mg by mouth daily.    [provider]  Cyanocobalamin (VITAMIN B-12 PO) Take 1 tablet by mouth daily.    [provider]  doxazosin (CARDURA) 8 MG tablet Take 4 mg by mouth 2 (two) times daily.    [provider]  ferrous sulfate 325 (65 FE) MG  tablet Take 325 mg by mouth every Monday, Wednesday, and Friday.    [provider]  furosemide (LASIX) 40 MG tablet Take 0.5 tablets (20 mg total) by mouth daily. 02/12/18   Thurnell Lose, MD  glucose 4 GM chewable tablet Chew 1 tablet by mouth daily as needed for low blood sugar.    [provider]  hydrALAZINE (APRESOLINE) 100 MG tablet Take 1 tablet (100 mg total) by mouth 3 (three) times daily. 11/11/16   Angiulli, Lavon Paganini, PA-C  HYDROcodone-acetaminophen (NORCO/VICODIN) 5-325 MG tablet Take 1 tablet by mouth every 6 (six) hours as needed. 04/14/18   Michaelia Beilfuss C, PA-C  insulin glargine (LANTUS) 100 unit/mL SOPN Inject 0.3 mLs (30 Units total) into the skin at bedtime. 02/12/18   Thurnell Lose, MD  isosorbide mononitrate (IMDUR) 60 MG 24 hr tablet Take 1 tablet (60 mg total) by mouth daily. 02/12/18 03/14/18  Thurnell Lose, MD  montelukast (SINGULAIR) 10 MG tablet Take 10 mg by mouth at bedtime.    [provider]  polyethylene glycol (MIRALAX / GLYCOLAX) packet Take 17 g by mouth daily. Patient taking differently: Take 17 g by mouth daily as needed for mild constipation.  04/03/16   Bonnielee Haff, MD  potassium chloride (K-DUR) 10 MEQ tablet Take 1 tablet (10 mEq total) by mouth daily. 02/12/18   Thurnell Lose, MD  senna (SENOKOT) 8.6 MG TABS tablet Take 1 tablet (8.6 mg total) by mouth daily. Patient taking differently: Take 1 tablet by mouth daily as needed for mild constipation.  04/03/16   Bonnielee Haff, MD  Skin Protectants, Misc. (EUCERIN) cream Apply 1 application topically See admin instructions. Apply to both feet daily after showering    [provider]    Family History Family History  Problem Relation Age of Onset  . Coronary artery disease Mother        MI in her 70s  . Hypertension Unknown   . Diabetes Unknown   . Alzheimer's disease Unknown   . Coronary artery disease Brother     Social History Social History    Tobacco Use  . Smoking status: Former Smoker    Last attempt to quit: 01/07/2018    Years since quitting: 0.2  . Smokeless tobacco: Never Used  Substance Use Topics  . Alcohol use: No    Comment: Few beers every other day hx  . Drug use: No     Allergies   Amlodipine besy-benazepril hcl and Shellfish allergy   Review of Systems Review of Systems  Constitutional: Negative for activity change, appetite change, chills, fatigue and fever.  HENT: Positive for dental problem and facial swelling. Negative for congestion, ear pain, rhinorrhea, sinus pressure, sore throat, trouble  swallowing and voice change.   Eyes: Negative for discharge and redness.  Respiratory: Negative for cough, chest tightness and shortness of breath.   Cardiovascular: Negative for chest pain.  Gastrointestinal: Negative for abdominal pain, diarrhea, nausea and vomiting.  Musculoskeletal: Negative for myalgias.  Skin: Negative for rash.  Neurological: Negative for dizziness, light-headedness and headaches.     Physical Exam Triage Vital Signs ED Triage Vitals  Enc Vitals Group     BP 04/14/18 1343 (!) 196/101     Pulse Rate 04/14/18 1343 88     Resp 04/14/18 1343 18     Temp 04/14/18 1343 98.8 F (37.1 C)     Temp Source 04/14/18 1343 Oral     SpO2 04/14/18 1343 98 %     Weight --      Height --      Head Circumference --      Peak Flow --      Pain Score 04/14/18 1342 9     Pain Loc --      Pain Edu? --      Excl. in [place]? --    No data found.  Updated Vital Signs BP (!) 173/73 (BP Location: Left Arm)   Pulse 88   Temp 98.8 F (37.1 C) (Oral)   Resp 18   SpO2 98%   Visual Acuity Right Eye Distance:   Left Eye Distance:   Bilateral Distance:    Right Eye Near:   Left Eye Near:    Bilateral Near:     Physical Exam  Constitutional: He is oriented to person, place, and time. He appears well-developed and well-nourished.  HENT:  Head: Normocephalic and atraumatic.  Mild swelling to  right lower jaw, approximately #28 with surrounding gingival swelling and tenderness, no soft palate swelling and posterior pharynx and below the tongue  No uvula swelling or deviation  Eyes: Pupils are equal, round, and reactive to light. Conjunctivae and EOM are normal.  Neck: Neck supple.  No neck swelling or erythema, full active range of motion  Cardiovascular: Normal rate and regular rhythm.  No murmur heard. Pulmonary/Chest: Effort normal and breath sounds normal. No respiratory distress.  Abdominal: Soft. There is no tenderness.  Musculoskeletal: He exhibits no edema.  Neurological: He is alert and oriented to person, place, and time.  Skin: Skin is warm and dry.  Psychiatric: He has a normal mood and affect.  Nursing note and vitals reviewed.    UC Treatments / Results  Labs (all labs ordered are listed, but only abnormal results are displayed) Labs Reviewed - No data to display  EKG None  Radiology No results found.  Procedures Procedures (including critical care time)  Medications Ordered in UC Medications - No data to display  Initial Impression / Assessment and Plan / UC Course  I have reviewed the triage vital signs and the nursing notes.  Pertinent labs & imaging results that were available during my care of the patient were reviewed by me and considered in my medical decision making (see chart for details).     Blood pressure rechecked from initial reading and improved 178/73.  Advised to continue to monitor symptoms and blood pressure, take blood pressure medicine as prescribed.  Will treat for dental abscess with Augmentin for 1 week.  Follow-up with dentistry.  For pain recommended Tylenol for mild to moderate pain.  Avoiding ibuprofen given CHF and CKD.  Provided 2 days worth of hydrocodone to use only for severe pain.  Discussed not to drive or work while taking this medicine.  Pain would be expected to improve with antibiotic in 2 to 3 days.   Follow-up if pain not improving or swelling worsening.Discussed strict return precautions. Patient verbalized understanding and is agreeable with plan.  Final Clinical Impressions(s) / UC Diagnoses   Final diagnoses:  Dental abscess     Discharge Instructions     Please use dental resource to contact offices to seek permenant treatment/relief.   Today we have given you an antibiotic. This should help with pain as any infection is cleared.   For pain please take 600mg -800mg  of Ibuprofen every 8 hours, take with 1000 mg of Tylenol Extra strength every 8 hours. These are safe to take together. Please take with food.   I have also provided 2 days worth of stronger pain medication. This should only be used for severe pain. Do not drive or operate machinery while taking this medication.   Please return if you start to experience significant swelling of your face, experiencing fever.   ED Prescriptions    Medication Sig Dispense Auth. Provider   amoxicillin-clavulanate (AUGMENTIN) 875-125 MG tablet Take 1 tablet by mouth every 12 (twelve) hours. 14 tablet Dejanique Ruehl C, PA-C   HYDROcodone-acetaminophen (NORCO/VICODIN) 5-325 MG tablet Take 1 tablet by mouth every 6 (six) hours as needed. 8 tablet Tymel Conely, [place] C, PA-C     Controlled Substance Prescriptions [place] Controlled Substance Registry consulted? Not Applicable   Janith L[place], [place] 04/15/18 2778

## 2018-06-27 NOTE — Progress Notes (Signed)
[name] Note  06/28/2018     CHIEF COMPLAINT Patient presents for Diabetic Eye Exam   HISTORY OF PRESENT ILLNESS: John Jacobson is a 63 y.o. male who presents to the clinic today for:   HPI    Diabetic Eye Exam    Vision is blurred for near, is blurred for distance and is worsening.  Associated Symptoms Floaters.  Negative for Flashes, Blind Spot, Photophobia, Scalp Tenderness, Fever, Pain, Glare, Jaw Claudication, Weight Loss, Distortion, Redness, Trauma, Shoulder/Hip pain and Fatigue.  Diabetes characteristics include Type 2.  This started 8 years ago.  Blood sugar level is controlled.  Last Blood Glucose: Hasn't checked.  Last A1C 7.6 (Down from 8.3).  Associated Diagnosis Kidney Disease and Neuropathy.  I, the attending physician,  performed the HPI with the patient and updated documentation appropriately.          Comments    Patient referred for diabetic retinal evaluation. Patient states history of vitreous injections OU at [facility] in August 2018. Also had PPV OS for traction RD in August 2018. Laser performed OD in 2018. HX stroke in 2018-affected left side.  Had procedures with Dr. Brigitte Pulse. Vision seems to be blurry OU. Vision has been blurry for several months.  Last A1c was 7.6 (2-3 weeks ago) and this was down from 8.3 previously.        Last edited by Bernarda Caffey, MD on 06/28/2018 11:57 PM. (History)    pt is previous pt of Dr. Dwana Melena, pt states Dr. Manuella Ghazi did a vitrectomy OS and laser sx OD, pt states he used to receive injections from the [place], pt states he was seen at the [place] last year, but now [facility] has taken over all of his medical care, he states the last retina dr he saw was Dr. Anderson Malta at the [facility], he states the last injection was in August 2019, he states he was receiving injections from her every 2-3 months, pt states he was receiving Alfonse Flavors  Referring physician: Angelica Pou, MD [place], [place] [id number]  HISTORICAL INFORMATION:   Selected notes from the MEDICAL RECORD NUMBER Referred by Dr. Dorian Pod [facility]) for DM exam LEE:  Ocular Hx- PMH-    CURRENT MEDICATIONS: No current outpatient medications on file. (Ophthalmic Drugs)   No current facility-administered medications for this visit.  (Ophthalmic Drugs)   Current Outpatient Medications (Other)  Medication Sig  . acetaminophen (TYLENOL) 500 MG tablet Take 500 mg by mouth 2 (two) times daily as needed (for pain or headaches).  Marland Kitchen aspirin EC 325 MG EC tablet Take 1 tablet (325 mg total) by mouth daily.  Marland Kitchen atorvastatin (LIPITOR) 20 MG tablet Take 20 mg by mouth daily.  . carvedilol (COREG) 25 MG tablet Take 12.5 mg by mouth every 12 (twelve) hours.  . Cholecalciferol (VITAMIN D3) 2000 units TABS Take 4,000 Units by mouth daily.  . cloNIDine (CATAPRES) 0.3 MG tablet Take 1 tablet (0.3 mg total) by mouth 3 (three) times daily.  Marland Kitchen doxazosin (CARDURA) 8 MG tablet Take 4 mg by mouth 2 (two) times daily.  . furosemide (LASIX) 40 MG tablet Take 0.5 tablets (20 mg total) by mouth daily.  Marland Kitchen glucose 4 GM chewable tablet Chew 1 tablet by mouth daily as needed for low blood sugar.  . hydrALAZINE (APRESOLINE) 100 MG tablet Take 1 tablet (100 mg total) by mouth 3 (three)  times daily.  . polyethylene glycol (MIRALAX / GLYCOLAX) packet Take 17 g by mouth daily. (Patient taking differently: Take 17 g by mouth daily as needed for mild constipation. )  . senna (SENOKOT) 8.6 MG TABS tablet Take 1 tablet (8.6 mg total) by mouth daily. (Patient taking differently: Take 1 tablet by mouth daily as needed for mild constipation. )  . Skin Protectants, Misc. (EUCERIN) cream Apply 1 application topically See admin instructions. Apply to both feet daily after showering  . amoxicillin-clavulanate (AUGMENTIN) 875-125 MG tablet Take 1 tablet by mouth every 12 (twelve) hours. (Patient not taking: Reported on 06/28/2018)  .  clopidogrel (PLAVIX) 75 MG tablet Take 75 mg by mouth daily.  . Cyanocobalamin (VITAMIN B-12 PO) Take 1 tablet by mouth daily.  . ferrous sulfate 325 (65 FE) MG tablet Take 325 mg by mouth every Monday, Wednesday, and Friday.  Marland Kitchen HYDROcodone-acetaminophen (NORCO/VICODIN) 5-325 MG tablet Take 1 tablet by mouth every 6 (six) hours as needed. (Patient not taking: Reported on 06/28/2018)  . insulin glargine (LANTUS) 100 unit/mL SOPN Inject 0.3 mLs (30 Units total) into the skin at bedtime. (Patient not taking: Reported on 06/28/2018)  . isosorbide mononitrate (IMDUR) 60 MG 24 hr tablet Take 1 tablet (60 mg total) by mouth daily.  . montelukast (SINGULAIR) 10 MG tablet Take 10 mg by mouth at bedtime.  . potassium chloride (K-DUR) 10 MEQ tablet Take 1 tablet (10 mEq total) by mouth daily. (Patient not taking: Reported on 06/28/2018)   No current facility-administered medications for this visit.  (Other)      REVIEW OF SYSTEMS: ROS    Positive for: Genitourinary, Endocrine, Cardiovascular, Eyes, Respiratory   Negative for: Constitutional, Gastrointestinal, Neurological, Skin, Musculoskeletal, HENT, Psychiatric, Allergic/Imm, Heme/Lymph   Last edited by Roselee Nova D on 06/28/2018  8:55 AM. (History)       ALLERGIES Allergies  Allergen Reactions  . Amlodipine Besy-Benazepril Hcl Anaphylaxis, Shortness Of Breath and Swelling    Mouth and tongue swelling  . Shellfish Allergy Anaphylaxis, Shortness Of Breath and Swelling    PAST MEDICAL HISTORY Past Medical History:  Diagnosis Date  . Asthma   . Back pain   . CHF (congestive heart failure) ([place])   . Chronic kidney disease   . DM (diabetes mellitus) ([place])   . Hepatitis C   . HTN (hypertension)   . RBBB (right bundle branch block)   . Stroke [facility]) 2018   Past Surgical History:  Procedure Laterality Date  . BACK SURGERY  1995  . PARS PLANA VITRECTOMY Left 01/25/2017  . TOE AMPUTATION  2016    FAMILY HISTORY Family History  Problem  Relation Age of Onset  . Coronary artery disease Mother        MI in her 25s  . Diabetes Mother   . Macular degeneration Mother   . Hypertension Other   . Diabetes Other   . Alzheimer's disease Other   . Coronary artery disease Brother   . Diabetes Brother   . Diabetes Sister     SOCIAL HISTORY Social History   Tobacco Use  . Smoking status: Former Smoker    Last attempt to quit: 01/07/2018    Years since quitting: 0.4  . Smokeless tobacco: Never Used  Substance Use Topics  . Alcohol use: No    Comment: Few beers every other day hx  . Drug use: No         OPHTHALMIC EXAM:  Base Eye Exam    Visual  Acuity (Snellen - Linear)      Right Left   Dist [place] 20/100 20/100   Dist ph [place] 20/60 20/50       Tonometry (Tonopen, 9:24 AM)      Right Left   Pressure 17 13       Pupils      Dark Light Shape React APD   Right 3 2 Round Slow None   Left 3 2 Round Slow None       Visual Fields (Counting fingers)      Left Right     Full   Restrictions Partial outer inferior temporal deficiency        Extraocular Movement      Right Left    Full, Ortho Full, Ortho       Neuro/Psych    Oriented x3:  Yes   Mood/Affect:  Normal       Dilation    Both eyes:  1.0% Mydriacyl, 2.5% Phenylephrine @ 9:24 AM        Slit Lamp and Fundus Exam    Slit Lamp Exam      Right Left   Lids/Lashes Dermatochalasis - upper lid, mild Meibomian gland dysfunction Dermatochalasis - upper lid, mild Meibomian gland dysfunction   Conjunctiva/Sclera nasal Pinguecula, Melanosis nasal/temporal Pinguecula, Melanosis   Cornea 1+ Punctate epithelial erosions 3+ Punctate epithelial erosions   Anterior Chamber deep, narrow temporal angle deep, narrow temporal angle   Iris Round and dilated, No NVI Round and dilated, No NVI   Lens 2+ Nuclear sclerosis, 2+ Cortical cataract 2-3+ Nuclear sclerosis, 2-3+ Cortical cataract, 1+ Posterior subcapsular cataract   Vitreous Vitreous syneresis post vitrectomy        Fundus Exam      Right Left   Disc Pink and Sharp, ?fine NVD superior mild pallorm sharp rim   C/D Ratio 0.4 0.5   Macula Blunted foveal reflex, scattered Microaneurysms and Exudates, +Epiretinal membrane, focal edema superior macula Blunted foveal reflex, scattered Microaneurysms and Exudates, cluster of hemes temporal to fovea with edema   Vessels severly attenuated arterioles, Tortuous, AV crossing changes severly attenuated arterioles, Tortuous, AV crossing changes   Periphery Attached, 360 PRP room for fill in superiorly, scattered MA   Attached, 360 PRP in place        Refraction    Wearing Rx      Sphere Cylinder Axis   Right +3.25 +0.50 023   Left +1.75 +1.00 160   Age:  1 yr   Type:  SVL  Reading RX       Manifest Refraction      Sphere Cylinder Axis Dist VA   Right +0.75 +1.00 020 20/40   Left -0.75 +0.50 012 20/60+1          IMAGING AND PROCEDURES  Imaging and Procedures for @TODAY @  OCT, Retina - OU - Both Eyes       Right Eye Quality was good. Central Foveal Thickness: 266. Progression has no prior data. Findings include abnormal foveal contour, epiretinal membrane, no SRF, outer retinal atrophy, intraretinal fluid, inner retinal atrophy.   Left Eye Quality was good. Central Foveal Thickness: 268. Progression has no prior data. Findings include abnormal foveal contour, intraretinal fluid, no SRF, epiretinal membrane, outer retinal atrophy, inner retinal atrophy.   Notes *Images captured and stored on drive  Diagnosis / Impression:  OU: ERM, diffuse atrophy, +DME   Clinical management:  See below  Abbreviations: NFP - Normal foveal profile. CME - cystoid  macular edema. PED - pigment epithelial detachment. IRF - intraretinal fluid. SRF - subretinal fluid. EZ - ellipsoid zone. ERM - epiretinal membrane. ORA - outer retinal atrophy. ORT - outer retinal tubulation. SRHM - subretinal hyper-reflective material                  ASSESSMENT/PLAN:    ICD-10-CM   1. Proliferative diabetic retinopathy of both eyes with macular edema associated with type 2 diabetes mellitus ([place]) H85.2778   2. Retinal edema H35.81 OCT, Retina - OU - Both Eyes  3. Epiretinal membrane (ERM) of both eyes H35.373   4. Essential hypertension I10   5. Hypertensive retinopathy of both eyes H35.033   6. 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 Proliferative diabetic retinopathy w/ DME, OU - former pt of Dwana Melena at [facility] and [facility] at [place] -- known history of proliferative diabetic retinopathy - history PPV OS and laser PRP OD with Dr. Manuella Ghazi for PDR OU w/ TRD OS -- 01/25/17 - history of intravitreal anti-VEGF therapy with Dr. Anderson Malta -- last IVE ~01/2018 - The incidence, risk factors for progression, natural history and treatment options for diabetic retinopathy were discussed with patient.   - The need for close monitoring of blood glucose, blood pressure, and serum lipids, avoiding cigarette or any type of tobacco, and the need for long term follow up was also discussed with patient. - exam shows relatively stable PDR, ?fine NVD OD, good PRP laser in place - OCT shows focal diabetic macular edema, both eyes  - discussed findings and prognosis - recommend IVA OU today -- but unable to get get authorization for treatment - pt to return once authorization for treatment obtained - f/u for IVA OU, then 4 wks for DFE/OCT/FA transit OD and possible IVA  3. Epiretinal membrane, both eyes  - The natural history, anatomy, potential for loss of vision, and treatment options including vitrectomy techniques and the complications of endophthalmitis, retinal detachment, vitreous hemorrhage, cataract progression and permanent vision loss discussed with the patient. - mild ERM - asymptomatic, no metamorphopsia - no indication for surgery at this time - monitor for now  4,5. Hypertensive retinopathy OU -  discussed importance of tight BP control - monitor  6. Combined form cataract OU  - The symptoms of cataract, surgical options, and treatments and risks were discussed with patient. - discussed diagnosis and progression - OS > OD, likely sequela from PPV OS - approaching visual significance - monitor for now    Ophthalmic Meds Ordered this visit:  No orders of the defined types were placed in this encounter.      Return in about 4 weeks (around 07/26/2018) for f/u DM exam, DFE, OCT, FA.  There are no Patient Instructions on file for this visit.   Explained the diagnoses, plan, and follow up with the patient and they expressed understanding.  Patient expressed understanding of the importance of proper follow up care.   This document serves as a record of services personally performed by Gardiner Sleeper, MD, PhD. It was created on their behalf by Ernest Mallick, OA, an ophthalmic assistant. The creation of this record is the provider's dictation and/or activities during the visit.    Electronically signed by: Ernest Mallick, OA  01.15.2020 12:05 AM    Gardiner Sleeper, M.D., Ph.D. Diseases & Surgery of the Retina and Vitre[facility] [place]  I have reviewed the above  documentation for accuracy and completeness, and I agree with the above. Gardiner Sleeper, M.D., Ph.D. 06/29/18 12:05 AM    Abbreviations: M myopia (nearsighted); A astigmatism; H hyperopia (farsighted); P presbyopia; Mrx spectacle prescription;  CTL contact lenses; OD right eye; OS left eye; OU both eyes  XT exotropia; ET esotropia; PEK punctate epithelial keratitis; PEE punctate epithelial erosions; DES dry eye syndrome; MGD meibomian gland dysfunction; ATs artificial tears; PFAT's preservative free artificial tears; [place] nuclear sclerotic cataract; PSC posterior subcapsular cataract; ERM epi-retinal membrane; PVD posterior vitreous detachment; RD retinal detachment; DM diabetes mellitus; DR diabetic  retinopathy; NPDR non-proliferative diabetic retinopathy; PDR proliferative diabetic retinopathy; CSME clinically significant macular edema; DME diabetic macular edema; dbh dot blot hemorrhages; CWS cotton wool spot; POAG primary open angle glaucoma; C/D cup-to-disc ratio; HVF humphrey visual field; GVF goldmann visual field; OCT optical coherence tomography; IOP intraocular pressure; BRVO Branch retinal vein occlusion; CRVO central retinal vein occlusion; CRAO central retinal artery occlusion; BRAO branch retinal artery occlusion; RT retinal tear; SB scleral buckle; PPV pars plana vitrectomy; VH Vitreous hemorrhage; PRP panretinal laser photocoagulation; IVK intravitreal kenalog; VMT vitreomacular traction; MH Macular hole;  NVD neovascularization of the disc; NVE neovascularization elsewhere; AREDS age related eye disease study; ARMD age related macular degeneration; POAG primary open angle glaucoma; EBMD epithelial/anterior basement membrane dystrophy; ACIOL anterior chamber intraocular lens; IOL intraocular lens; PCIOL posterior chamber intraocular lens; Phaco/IOL phacoemulsification with intraocular lens placement; [place] photorefractive keratectomy; LASIK laser assisted in situ keratomileusis; HTN hypertension; DM diabetes mellitus; COPD chronic obstructive pulmonary disease

## 2018-06-28 ENCOUNTER — Encounter (INDEPENDENT_AMBULATORY_CARE_PROVIDER_SITE_OTHER): Payer: Self-pay | Admitting: Ophthalmology

## 2018-06-28 ENCOUNTER — Ambulatory Visit (INDEPENDENT_AMBULATORY_CARE_PROVIDER_SITE_OTHER): Payer: Medicare (Managed Care) | Admitting: Ophthalmology

## 2018-06-28 DIAGNOSIS — I10 Essential (primary) hypertension: Secondary | ICD-10-CM

## 2018-06-28 DIAGNOSIS — E113513 Type 2 diabetes mellitus with proliferative diabetic retinopathy with macular edema, bilateral: Secondary | ICD-10-CM | POA: Diagnosis not present

## 2018-06-28 DIAGNOSIS — H35373 Puckering of macula, bilateral: Secondary | ICD-10-CM | POA: Diagnosis not present

## 2018-06-28 DIAGNOSIS — H3581 Retinal edema: Secondary | ICD-10-CM | POA: Diagnosis not present

## 2018-06-28 DIAGNOSIS — H35033 Hypertensive retinopathy, bilateral: Secondary | ICD-10-CM

## 2018-06-28 DIAGNOSIS — H25813 Combined forms of age-related cataract, bilateral: Secondary | ICD-10-CM

## 2018-07-25 NOTE — Progress Notes (Addendum)
[facility] Note  07/26/2018     CHIEF COMPLAINT Patient presents for Retina Follow Up   HISTORY OF PRESENT ILLNESS: John Jacobson is a 63 y.o. male who presents to the clinic today for:   HPI    Retina Follow Up    Patient presents with  Diabetic Retinopathy.  In both eyes.  This started 4 weeks ago.  Severity is moderate.  Duration of 4 weeks.  Since onset it is stable.  I, the attending physician,  performed the HPI with the patient and updated documentation appropriately.          Comments    Patient here for 4 weeks follow up for PDR with DME OU. Patient states vision is blurry. Has no eye pain. Had a web like floater in OD when woke up about 3 - 4 days ago. Lasted about 30 minutes. Wants glasses to pass to get drivers license.        Last edited by Bernarda Caffey, MD on 07/26/2018  9:12 AM. (History)    pt states about 4 days ago he woke up and saw thick, dark spider webs in his vision, he says it last for about 30 minutes and then went away  Referring physician: No referring provider defined for this encounter.  HISTORICAL INFORMATION:   Selected notes from the MEDICAL RECORD NUMBER Referred by Dr. Dorian [facility]) for DM exam LEE:  Ocular Hx- PMH-    CURRENT MEDICATIONS: No current outpatient medications on file. (Ophthalmic Drugs)   No current facility-administered medications for this visit.  (Ophthalmic Drugs)   Current Outpatient Medications (Other)  Medication Sig  . acetaminophen (TYLENOL) 500 MG tablet Take 500 mg by mouth 2 (two) times daily as needed (for pain or headaches).  Marland Kitchen amoxicillin-clavulanate (AUGMENTIN) 875-125 MG tablet Take 1 tablet by mouth every 12 (twelve) hours. (Patient not taking: Reported on 06/28/2018)  . aspirin EC 325 MG EC tablet Take 1 tablet (325 mg total) by mouth daily.  Marland Kitchen atorvastatin (LIPITOR) 20 MG tablet Take 20 mg by mouth daily.  . carvedilol (COREG) 25 MG tablet Take 12.5  mg by mouth every 12 (twelve) hours.  . Cholecalciferol (VITAMIN D3) 2000 units TABS Take 4,000 Units by mouth daily.  . cloNIDine (CATAPRES) 0.3 MG tablet Take 1 tablet (0.3 mg total) by mouth 3 (three) times daily.  . clopidogrel (PLAVIX) 75 MG tablet Take 75 mg by mouth daily.  . Cyanocobalamin (VITAMIN B-12 PO) Take 1 tablet by mouth daily.  Marland Kitchen doxazosin (CARDURA) 8 MG tablet Take 4 mg by mouth 2 (two) times daily.  . ferrous sulfate 325 (65 FE) MG tablet Take 325 mg by mouth every Monday, Wednesday, and Friday.  . furosemide (LASIX) 40 MG tablet Take 0.5 tablets (20 mg total) by mouth daily.  Marland Kitchen glucose 4 GM chewable tablet Chew 1 tablet by mouth daily as needed for low blood sugar.  . hydrALAZINE (APRESOLINE) 100 MG tablet Take 1 tablet (100 mg total) by mouth 3 (three) times daily.  Marland Kitchen HYDROcodone-acetaminophen (NORCO/VICODIN) 5-325 MG tablet Take 1 tablet by mouth every 6 (six) hours as needed. (Patient not taking: Reported on 06/28/2018)  . insulin glargine (LANTUS) 100 unit/mL SOPN Inject 0.3 mLs (30 Units total) into the skin at bedtime. (Patient not taking: Reported on 06/28/2018)  . isosorbide mononitrate (IMDUR) 60 MG 24 hr tablet Take 1 tablet (60 mg total) by mouth daily.  . montelukast (SINGULAIR)  10 MG tablet Take 10 mg by mouth at bedtime.  . polyethylene glycol (MIRALAX / GLYCOLAX) packet Take 17 g by mouth daily. (Patient taking differently: Take 17 g by mouth daily as needed for mild constipation. )  . potassium chloride (K-DUR) 10 MEQ tablet Take 1 tablet (10 mEq total) by mouth daily. (Patient not taking: Reported on 06/28/2018)  . senna (SENOKOT) 8.6 MG TABS tablet Take 1 tablet (8.6 mg total) by mouth daily. (Patient taking differently: Take 1 tablet by mouth daily as needed for mild constipation. )  . Skin Protectants, Misc. (EUCERIN) cream Apply 1 application topically See admin instructions. Apply to both feet daily after showering   Current Facility-Administered Medications  (Other)  Medication Route  . Bevacizumab (AVASTIN) SOLN 1.25 mg Intravitreal  . Bevacizumab (AVASTIN) SOLN 1.25 mg Intravitreal      REVIEW OF SYSTEMS: ROS    Positive for: Genitourinary, Endocrine, Cardiovascular, Eyes, Respiratory   Negative for: Constitutional, Gastrointestinal, Neurological, Skin, Musculoskeletal, HENT, Psychiatric, Allergic/Imm, Heme/Lymph   Last edited by Theodore Demark on 07/26/2018  8:56 AM. (History)       ALLERGIES Allergies  Allergen Reactions  . Amlodipine Besy-Benazepril Hcl Anaphylaxis, Shortness Of Breath and Swelling    Mouth and tongue swelling  . Shellfish Allergy Anaphylaxis, Shortness Of Breath and Swelling    PAST MEDICAL HISTORY Past Medical History:  Diagnosis Date  . Asthma   . Back pain   . CHF (congestive heart failure) ([place])   . Chronic kidney disease   . DM (diabetes mellitus) ([place])   . Hepatitis C   . HTN (hypertension)   . RBBB (right bundle branch block)   . Stroke [facility]) 2018   Past Surgical History:  Procedure Laterality Date  . BACK SURGERY  1995  . PARS PLANA VITRECTOMY Left 01/25/2017  . TOE AMPUTATION  2016    FAMILY HISTORY Family History  Problem Relation Age of Onset  . Coronary artery disease Mother        MI in her 63s  . Diabetes Mother   . Macular degeneration Mother   . Hypertension Other   . Diabetes Other   . Alzheimer's disease Other   . Coronary artery disease Brother   . Diabetes Brother   . Diabetes Sister     SOCIAL HISTORY Social History   Tobacco Use  . Smoking status: Former Smoker    Last attempt to quit: 01/07/2018    Years since quitting: 0.5  . Smokeless tobacco: Never Used  Substance Use Topics  . Alcohol use: No    Comment: Few beers every other day hx  . Drug use: No         OPHTHALMIC EXAM:  Base Eye Exam    Visual Acuity (Snellen - Linear)      Right Left   Dist [place] 20/60 20/80 +1   Dist ph [place] 20/50 20/40       Tonometry (Tonopen, 8:52 AM)      Right  Left   Pressure 15 14       Pupils      Dark Light Shape React APD   Right 3 2 Round Slow None   Left 3 2 Round Slow None       Visual Fields (Counting fingers)      Left Right     Full   Restrictions Partial outer inferior temporal deficiency        Extraocular Movement      Right Left  Full, Ortho Full, Ortho       Neuro/Psych    Oriented x3:  Yes   Mood/Affect:  Normal       Dilation    Both eyes:  1.0% Mydriacyl, 2.5% Phenylephrine @ 8:52 AM        Slit Lamp and Fundus Exam    Slit Lamp Exam      Right Left   Lids/Lashes Dermatochalasis - upper lid, mild Meibomian gland dysfunction Dermatochalasis - upper lid, mild Meibomian gland dysfunction   Conjunctiva/Sclera nasal Pinguecula, Melanosis nasal/temporal Pinguecula, Melanosis   Cornea 1+ Punctate epithelial erosions 3+ Punctate epithelial erosions   Anterior Chamber deep, narrow temporal angle deep, narrow temporal angle   Iris Round and dilated, No NVI Round and dilated, No NVI   Lens 2+ Nuclear sclerosis, 2+ Cortical cataract 2-3+ Nuclear sclerosis, 2-3+ Cortical cataract, 1+ Posterior subcapsular cataract   Vitreous Vitreous syneresis post vitrectomy       Fundus Exam      Right Left   Disc Pink and Sharp, ?fine NVD superior mild pallor sharp rim   C/D Ratio 0.4 0.5   Macula Blunted foveal reflex, scattered IRH and Exudates, +Epiretinal membrane, +NVE Blunted foveal reflex, scattered Microaneurysms, cluster of IRH temporal to fovea    Vessels Vascular attenuation, scleriatic attenules Vascular attenuation   Periphery Attached, 360 PRP room for fill in superiorly, scattered IRH Attached, 360 PRP in place          IMAGING AND PROCEDURES  Imaging and Procedures for @TODAY @  OCT, Retina - OU - Both Eyes       Right Eye Quality was good. Central Foveal Thickness: 268. Progression has been stable. Findings include abnormal foveal contour, epiretinal membrane, no SRF, outer retinal atrophy,  intraretinal fluid, inner retinal atrophy (Interval increase in IT IRF/interval decrease in IN fovea).   Left Eye Quality was good. Central Foveal Thickness: 265. Progression has been stable. Findings include abnormal foveal contour, intraretinal fluid, no SRF, epiretinal membrane, outer retinal atrophy, inner retinal atrophy.   Notes *Images captured and stored on drive  Diagnosis / Impression:  OU: ERM, diffuse atrophy, +DME   Clinical management:  See below  Abbreviations: NFP - Normal foveal profile. CME - cystoid macular edema. PED - pigment epithelial detachment. IRF - intraretinal fluid. SRF - subretinal fluid. EZ - ellipsoid zone. ERM - epiretinal membrane. ORA - outer retinal atrophy. ORT - outer retinal tubulation. SRHM - subretinal hyper-reflective material        Fluorescein Angiography Optos (Transit OD)       Right Eye   Progression has no prior data. Early phase findings include vascular perfusion defect, staining, microaneurysm. Mid/Late phase findings include staining, microaneurysm, vascular perfusion defect.   Left Eye   Progression has no prior data. Early phase findings include vascular perfusion defect, retinal neovascularization, microaneurysm, staining. Mid/Late phase findings include staining, leakage, microaneurysm, retinal neovascularization, vascular perfusion defect, neovascularization disc.   Notes Images stored on drive;   Impression: PDR OU OD: scattered MA; +NV; significant vascular perfusion defects OS: significant vascular profusion defects; no active NV; MA         Intravitreal Injection, Pharmacologic Agent - OD - Right Eye       Time Out 07/26/2018. 10:30 AM. Confirmed correct patient, procedure, site, and patient consented.   Anesthesia Topical anesthesia was used. Anesthetic medications included Lidocaine 2%, Proparacaine 0.5%.   Procedure Preparation included 5% betadine to ocular surface, eyelid speculum. A 30 gauge  needle was  used.   Injection:  1.25 mg Bevacizumab (AVASTIN) SOLN   NDC: 99357-017-79, Lot: [id number] , Expiration date: 10/12/2018   Route: Intravitreal, Site: Right Eye, Waste: 0 mL  Post-op Post injection exam found visual acuity of at least counting fingers. The patient tolerated the procedure well. There were no complications. The patient received written and verbal post procedure care education.        Intravitreal Injection, Pharmacologic Agent - OS - Left Eye       Time Out 07/26/2018. 10:35 AM. Confirmed correct patient, procedure, site, and patient consented.   Anesthesia Topical anesthesia was used. Anesthetic medications included Lidocaine 2%, Proparacaine 0.5%.   Procedure Preparation included 5% betadine to ocular surface, eyelid speculum. A supplied needle was used.   Injection:  1.25 mg Bevacizumab (AVASTIN) SOLN   NDC: 08/08/2018, Lot: [id number] , Expiration date: 09/26/2018   Route: Intravitreal, Site: Left Eye, Waste: 0 mg  Post-op Post injection exam found visual acuity of at least counting fingers. The patient tolerated the procedure well. There were no complications. The patient received written and verbal post procedure care education.                 ASSESSMENT/PLAN:    ICD-10-CM   1. Proliferative diabetic retinopathy of both eyes with macular edema associated with type 2 diabetes mellitus (HCC) : [id number]$ Fluorescein Angiography Optos (Transit OD)    Intravitreal Injection, Pharmacologic Agent - OD - Right Eye    Intravitreal Injection, Pharmacologic Agent - OS - Left Eye    Bevacizumab (AVASTIN) SOLN 1.25 mg    Bevacizumab (AVASTIN) SOLN 1.25 mg  2. Retinal edema H35.81 OCT, Retina - OU - Both Eyes  3. Epiretinal membrane (ERM) of both eyes H35.373   4. Essential hypertension I10   5. Hypertensive retinopathy of both eyes H35.033 Fluorescein Angiography Optos (Transit OD)  6. Combined forms of age-related cataract of both eyes H25.813      1,2. Proliferative diabetic retinopathy w/ DME, OU - former pt of 10/09/2018 at [facility] and [facility] at [place] -- known history of proliferative diabetic retinopathy - history PPV OS and laser PRP OD with Dr. Adonis Brook for PDR OU w/ TRD OS -- 01/25/17 - history of intravitreal anti-VEGF therapy with Dr. Manuella Ghazi -- last IVE ~01/2018 - The incidence, risk factors for progression, natural history and treatment options for diabetic retinopathy were discussed with patient.   - The need for close monitoring of blood glucose, blood pressure, and serum lipids, avoiding cigarette or any type of tobacco, and the need for long term follow up was also discussed with patient. - exam shows +fine NVD OD, good PRP laser in place OU - OCT shows focal diabetic macular edema, both eyes  - FA 2.13.2020 shows active NVE and NVD OD; significant vascular perfusion defects OS - May benefit from PRP fill in OU -- OD more critical - discussed findings and prognosis - recommend IVA OU today 02.13.2020 for DME - RBA of procedure discussed, questions answered - informed consent obtained and signed - see procedure note - f/u 2-3 weeks, DFE/OCT/PRP fill in OD  3. Epiretinal membrane, both eyes  - The natural history, anatomy, potential for loss of vision, and treatment options including vitrectomy techniques and the complications of endophthalmitis, retinal detachment, vitreous hemorrhage, cataract progression and permanent vision loss discussed with the patient. - mild ERM - asymptomatic, no metamorphopsia - no indication for surgery at this time - monitor for now  4,5. Hypertensive retinopathy OU -  discussed importance of tight BP control - monitor  6. Combined form cataract OU  - The symptoms of cataract, surgical options, and treatments and risks were discussed with patient. - discussed diagnosis and progression - OS > OD, likely sequela from PPV OS - approaching visual significance - monitor  for now   Ophthalmic Meds Ordered this visit:  Meds ordered this encounter  Medications  . Bevacizumab (AVASTIN) SOLN 1.25 mg  . Bevacizumab (AVASTIN) SOLN 1.25 mg       Return for 2-3 wks for PRP fill in OD.  There are no Patient Instructions on file for this visit.   Explained the diagnoses, plan, and follow up with the patient and they expressed understanding.  Patient expressed understanding of the importance of proper follow up care.   This document serves as a record of services personally performed by Gardiner Sleeper, MD, PhD. It was created on their behalf by Ernest Mallick, OA, an ophthalmic assistant. The creation of this record is the provider's dictation and/or activities during the visit.    Electronically signed by: Ernest Mallick, OA  2:57 PM   Gardiner Sleeper, M.D., Ph.D. Diseases & Surgery of the Retina and Vitreous [facility]  I have reviewed the above documentation for accuracy and completeness, and I agree with the above. Gardiner Sleeper, M.D., Ph.D. 07/28/18 2:57 PM   Abbreviations: M myopia (nearsighted); A astigmatism; H hyperopia (farsighted); P presbyopia; Mrx spectacle prescription;  CTL contact lenses; OD right eye; OS left eye; OU both eyes  XT exotropia; ET esotropia; PEK punctate epithelial keratitis; PEE punctate epithelial erosions; DES dry eye syndrome; MGD meibomian gland dysfunction; ATs artificial tears; PFAT's preservative free artificial tears; [place] nuclear sclerotic cataract; PSC posterior subcapsular cataract; ERM epi-retinal membrane; PVD posterior vitreous detachment; RD retinal detachment; DM diabetes mellitus; DR diabetic retinopathy; NPDR non-proliferative diabetic retinopathy; PDR proliferative diabetic retinopathy; CSME clinically significant macular edema; DME diabetic macular edema; dbh dot blot hemorrhages; CWS cotton wool spot; POAG primary open angle glaucoma; C/D cup-to-disc ratio; HVF humphrey visual field; GVF  goldmann visual field; OCT optical coherence tomography; IOP intraocular pressure; BRVO Branch retinal vein occlusion; CRVO central retinal vein occlusion; CRAO central retinal artery occlusion; BRAO branch retinal artery occlusion; RT retinal tear; SB scleral buckle; PPV pars plana vitrectomy; VH Vitreous hemorrhage; PRP panretinal laser photocoagulation; IVK intravitreal kenalog; VMT vitreomacular traction; MH Macular hole;  NVD neovascularization of the disc; NVE neovascularization elsewhere; AREDS age related eye disease study; ARMD age related macular degeneration; POAG primary open angle glaucoma; EBMD epithelial/anterior basement membrane dystrophy; ACIOL anterior chamber intraocular lens; IOL intraocular lens; PCIOL posterior chamber intraocular lens; Phaco/IOL phacoemulsification with intraocular lens placement; [place] photorefractive keratectomy; LASIK laser assisted in situ keratomileusis; HTN hypertension; DM diabetes mellitus; COPD chronic obstructive pulmonary disease

## 2018-07-26 ENCOUNTER — Ambulatory Visit (INDEPENDENT_AMBULATORY_CARE_PROVIDER_SITE_OTHER): Payer: Medicare (Managed Care) | Admitting: Ophthalmology

## 2018-07-26 DIAGNOSIS — H35373 Puckering of macula, bilateral: Secondary | ICD-10-CM

## 2018-07-26 DIAGNOSIS — H35033 Hypertensive retinopathy, bilateral: Secondary | ICD-10-CM | POA: Diagnosis not present

## 2018-07-26 DIAGNOSIS — I10 Essential (primary) hypertension: Secondary | ICD-10-CM | POA: Diagnosis not present

## 2018-07-26 DIAGNOSIS — H3581 Retinal edema: Secondary | ICD-10-CM

## 2018-07-26 DIAGNOSIS — H25813 Combined forms of age-related cataract, bilateral: Secondary | ICD-10-CM

## 2018-07-26 DIAGNOSIS — E113513 Type 2 diabetes mellitus with proliferative diabetic retinopathy with macular edema, bilateral: Secondary | ICD-10-CM

## 2018-07-26 MED ORDER — BEVACIZUMAB CHEMO INJECTION 1.25MG/0.05ML SYRINGE FOR KALEIDOSCOPE
1.2500 mg | INTRAVITREAL | Status: DC
  Administered 2018-07-26: 1.25 mg via INTRAVITREAL

## 2018-07-28 ENCOUNTER — Encounter (INDEPENDENT_AMBULATORY_CARE_PROVIDER_SITE_OTHER): Payer: Self-pay | Admitting: Ophthalmology

## 2018-08-08 NOTE — Progress Notes (Signed)
[facility] Note  08/09/2018     CHIEF COMPLAINT Patient presents for Retina Follow Up   HISTORY OF PRESENT ILLNESS: John Jacobson is a 63 y.o. male who presents to the clinic today for:   HPI    Retina Follow Up    Patient presents with  Diabetic Retinopathy.  In both eyes.  This started 1 month ago.  Since onset it is stable.  I, the attending physician,  performed the HPI with the patient and updated documentation appropriately.          Comments    F/U NPDR OU. Patient states he woke this am with blurred vision, he has not checked BS recently due to low battery.Pt is ready for tx today if indicted.         Last edited by Bernarda Caffey, MD on 08/09/2018 10:22 AM. (History)    pt here for PRP OD  Referring physician: No referring provider defined for this encounter.  HISTORICAL INFORMATION:   Selected notes from the MEDICAL RECORD NUMBER Referred by Dr. Dorian Pod [facility]) for DM exam LEE:  Ocular Hx- PMH-    CURRENT MEDICATIONS: Current Outpatient Medications (Ophthalmic Drugs)  Medication Sig  . prednisoLONE acetate (PRED FORTE) 1 % ophthalmic suspension Place 1 drop into the right eye 4 (four) times daily for 7 days.   No current facility-administered medications for this visit.  (Ophthalmic Drugs)   Current Outpatient Medications (Other)  Medication Sig  . acetaminophen (TYLENOL) 500 MG tablet Take 500 mg by mouth 2 (two) times daily as needed (for pain or headaches).  Marland Kitchen amoxicillin-clavulanate (AUGMENTIN) 875-125 MG tablet Take 1 tablet by mouth every 12 (twelve) hours.  Marland Kitchen aspirin EC 325 MG EC tablet Take 1 tablet (325 mg total) by mouth daily.  Marland Kitchen atorvastatin (LIPITOR) 20 MG tablet Take 20 mg by mouth daily.  . carvedilol (COREG) 25 MG tablet Take 12.5 mg by mouth every 12 (twelve) hours.  . Cholecalciferol (VITAMIN D3) 2000 units TABS Take 4,000 Units by mouth daily.  . cloNIDine (CATAPRES) 0.3 MG tablet Take 1  tablet (0.3 mg total) by mouth 3 (three) times daily.  . clopidogrel (PLAVIX) 75 MG tablet Take 75 mg by mouth daily.  . Cyanocobalamin (VITAMIN B-12 PO) Take 1 tablet by mouth daily.  Marland Kitchen doxazosin (CARDURA) 8 MG tablet Take 4 mg by mouth 2 (two) times daily.  . ferrous sulfate 325 (65 FE) MG tablet Take 325 mg by mouth every Monday, Wednesday, and Friday.  . furosemide (LASIX) 40 MG tablet Take 0.5 tablets (20 mg total) by mouth daily.  Marland Kitchen glucose 4 GM chewable tablet Chew 1 tablet by mouth daily as needed for low blood sugar.  . hydrALAZINE (APRESOLINE) 100 MG tablet Take 1 tablet (100 mg total) by mouth 3 (three) times daily.  Marland Kitchen HYDROcodone-acetaminophen (NORCO/VICODIN) 5-325 MG tablet Take 1 tablet by mouth every 6 (six) hours as needed.  . insulin glargine (LANTUS) 100 unit/mL SOPN Inject 0.3 mLs (30 Units total) into the skin at bedtime.  . montelukast (SINGULAIR) 10 MG tablet Take 10 mg by mouth at bedtime.  . polyethylene glycol (MIRALAX / GLYCOLAX) packet Take 17 g by mouth daily. (Patient taking differently: Take 17 g by mouth daily as needed for mild constipation. )  . potassium chloride (K-DUR) 10 MEQ tablet Take 1 tablet (10 mEq total) by mouth daily.  Marland Kitchen senna (SENOKOT) 8.6 MG TABS tablet Take 1 tablet (  8.6 mg total) by mouth daily. (Patient taking differently: Take 1 tablet by mouth daily as needed for mild constipation. )  . Skin Protectants, Misc. (EUCERIN) cream Apply 1 application topically See admin instructions. Apply to both feet daily after showering  . isosorbide mononitrate (IMDUR) 60 MG 24 hr tablet Take 1 tablet (60 mg total) by mouth daily.   Current Facility-Administered Medications (Other)  Medication Route  . Bevacizumab (AVASTIN) SOLN 1.25 mg Intravitreal  . Bevacizumab (AVASTIN) SOLN 1.25 mg Intravitreal      REVIEW OF SYSTEMS: ROS    Positive for: Endocrine, Eyes   Negative for: Constitutional, Gastrointestinal, Neurological, Skin, Genitourinary,  Musculoskeletal, HENT, Cardiovascular, Respiratory, Psychiatric, Allergic/Imm, Heme/Lymph   Last edited by Zenovia Jordan, LPN on 1/61/0960  4:54 AM. (History)       ALLERGIES Allergies  Allergen Reactions  . Amlodipine Besy-Benazepril Hcl Anaphylaxis, Shortness Of Breath and Swelling    Mouth and tongue swelling  . Shellfish Allergy Anaphylaxis, Shortness Of Breath and Swelling    PAST MEDICAL HISTORY Past Medical History:  Diagnosis Date  . Asthma   . Back pain   . CHF (congestive heart failure) ([place])   . Chronic kidney disease   . DM (diabetes mellitus) ([place])   . Hepatitis C   . HTN (hypertension)   . RBBB (right bundle branch block)   . Stroke [facility]) 2018   Past Surgical History:  Procedure Laterality Date  . BACK SURGERY  1995  . PARS PLANA VITRECTOMY Left 01/25/2017  . TOE AMPUTATION  2016    FAMILY HISTORY Family History  Problem Relation Age of Onset  . Coronary artery disease Mother        MI in her 76s  . Diabetes Mother   . Macular degeneration Mother   . Hypertension Other   . Diabetes Other   . Alzheimer's disease Other   . Coronary artery disease Brother   . Diabetes Brother   . Diabetes Sister     SOCIAL HISTORY Social History   Tobacco Use  . Smoking status: Former Smoker    Last attempt to quit: 01/07/2018    Years since quitting: 0.5  . Smokeless tobacco: Never Used  Substance Use Topics  . Alcohol use: No    Comment: Few beers every other day hx  . Drug use: No         OPHTHALMIC EXAM:  Base Eye Exam    Visual Acuity (Snellen - Linear)      Right Left   Dist [place] 20/50 20/150   Dist ph Naplate 20/40 -1 20/50       Tonometry (Tonopen, 9:22 AM)      Right Left   Pressure 17 19       Pupils      Dark Light Shape React APD   Right 3 2 Round Brisk None   Left 3 2 Round Brisk None       Visual Fields (Counting fingers)      Left Right    Full Full       Extraocular Movement      Right Left    Full, Ortho Full, Ortho        Neuro/Psych    Oriented x3:  Yes   Mood/Affect:  Normal       Dilation    Both eyes:  1.0% Mydriacyl, 2.5% Phenylephrine @ 9:17 AM        Slit Lamp and Fundus Exam    Slit Lamp  Exam      Right Left   Lids/Lashes Dermatochalasis - upper lid, mild Meibomian gland dysfunction Dermatochalasis - upper lid, mild Meibomian gland dysfunction   Conjunctiva/Sclera nasal Pinguecula, Melanosis nasal/temporal Pinguecula, Melanosis   Cornea 1+ Punctate epithelial erosions 3+ Punctate epithelial erosions   Anterior Chamber deep, narrow temporal angle deep, narrow temporal angle   Iris Round and dilated, No NVI Round and dilated, No NVI   Lens 2+ Nuclear sclerosis, 2+ Cortical cataract 2-3+ Nuclear sclerosis, 2-3+ Cortical cataract, 1+ Posterior subcapsular cataract   Vitreous Vitreous syneresis post vitrectomy       Fundus Exam      Right Left   Disc Pink and Sharp, ?fine NVD superior mild pallor sharp rim   C/D Ratio 0.4 0.5   Macula Blunted foveal reflex, scattered IRH and Exudates, +Epiretinal membrane, +NVE Blunted foveal reflex, scattered Microaneurysms, cluster of IRH temporal to fovea    Vessels Vascular attenuation, scleriatic attenules Vascular attenuation   Periphery Attached, 360 PRP room for fill in superiorly, scattered IRH Attached, 360 PRP in place          IMAGING AND PROCEDURES  Imaging and Procedures for @TODAY @  OCT, Retina - OU - Both Eyes       Right Eye Quality was good. Central Foveal Thickness: 273. Progression has improved. Findings include abnormal foveal contour, epiretinal membrane, no SRF, outer retinal atrophy, intraretinal fluid, inner retinal atrophy (Interval decrease in ST IRF).   Left Eye Quality was good. Central Foveal Thickness: 275. Progression has worsened. Findings include abnormal foveal contour, intraretinal fluid, no SRF, epiretinal membrane, outer retinal atrophy, inner retinal atrophy (Interval increase in IRF nasal macula, interval  decrease in IRF temporal macula).   Notes *Images captured and stored on drive  Diagnosis / Impression:  OU: ERM, diffuse atrophy, +DME  Clinical management:  See below  Abbreviations: NFP - Normal foveal profile. CME - cystoid macular edema. PED - pigment epithelial detachment. IRF - intraretinal fluid. SRF - subretinal fluid. EZ - ellipsoid zone. ERM - epiretinal membrane. ORA - outer retinal atrophy. ORT - outer retinal tubulation. SRHM - subretinal hyper-reflective material        Panretinal Photocoagulation - OD - Right Eye       LASER PROCEDURE NOTE  Diagnosis:   Proliferative Diabetic Retinopathy, RIGHT EYE  Procedure:  Pan-retinal photocoagulation using slit lamp laser, RIGHT EYE, fill-in  Anesthesia:  Topical  Surgeon: Bernarda Caffey, MD, PhD   Informed consent obtained, operative eye marked, and time out performed prior to initiation of laser.   Lumenis [id number] slit lamp laser Pattern: 3x3 square Power: 250 mW Duration: 30 msec  Spot size: 200 microns  # spots: 1255 spots fill in 360 - greatest posterior and nasal  Complications: None.  RTC: 2-3 wks  Patient tolerated the procedure well and received written and verbal post-procedure care information/education.                 ASSESSMENT/PLAN:    ICD-10-CM   1. Proliferative diabetic retinopathy of both eyes with macular edema associated with type 2 diabetes mellitus ([place]) K74.2595 Panretinal Photocoagulation - OD - Right Eye  2. Retinal edema H35.81 OCT, Retina - OU - Both Eyes  3. Epiretinal membrane (ERM) of both eyes H35.373   4. Essential hypertension I10   5. Hypertensive retinopathy of both eyes H35.033   6. 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 Proliferative diabetic retinopathy w/ DME, OU -  former pt of Dwana Melena at [facility] and Adon[facility] at [facility] -- known history of proliferative diabetic retinopathy - history PPV OS and laser PRP OD  with Dr. Manuella Ghazi for PDR OU w/ TRD OS -- 01/25/17 - history of intravitreal anti-VEGF therapy with Dr. Anderson Malta -- last IVE ~01/2018 - The incidence, risk factors for progression, natural history and treatment options for diabetic retinopathy were discussed with patient.   - The need for close monitoring of blood glucose, blood pressure, and serum lipids, avoiding cigarette or any type of tobacco, and the need for long term follow up was also discussed with patient. - exam shows +fine NVD OD, PRP laser in place OU - OCT shows focal diabetic macular edema, both eyes  - FA 2.13.2020 shows active NVE and NVD OD; significant vascular perfusion defects OS - May benefit from PRP fill in OU -- OD more critical - discussed findings and prognosis - s/p IVA OU #1 (02.13.20) - recommend PRP fill in OD today, 02.27.20 - RBA of procedure discussed, questions answered - informed consent obtained and signed - see procedure note - start PF OD QID x7 days - f/u 2-3 weeks  3. Epiretinal membrane, both eyes  - The natural history, anatomy, potential for loss of vision, and treatment options including vitrectomy techniques and the complications of endophthalmitis, retinal detachment, vitreous hemorrhage, cataract progression and permanent vision loss discussed with the patient. - mild ERM - asymptomatic, no metamorphopsia - no indication for surgery at this time - monitor for now  4,5. Hypertensive retinopathy OU - discussed importance of tight BP control - monitor  6. Combined form cataract OU  - The symptoms of cataract, surgical options, and treatments and risks were discussed with patient. - discussed diagnosis and progression - OS > OD, likely sequela from PPV OS - approaching visual significance - monitor for now   Ophthalmic Meds Ordered this visit:  Meds ordered this encounter  Medications  . prednisoLONE acetate (PRED FORTE) 1 % ophthalmic suspension    Sig: Place 1 drop into the right eye 4  (four) times daily for 7 days.    Dispense:  10 mL    Refill:  0       Return for 2-3 wks; DFE/OCT/possible injection.  There are no Patient Instructions on file for this visit.   Explained the diagnoses, plan, and follow up with the patient and they expressed understanding.  Patient expressed understanding of the importance of proper follow up care.   This document serves as a record of services personally performed by Gardiner Sleeper, MD, PhD. It was created on their behalf by Ernest Mallick, OA, an ophthalmic assistant. The creation of this record is the provider's dictation and/or activities during the visit.    Electronically signed by: Ernest Mallick, OA  02.26.2020 12:11 PM    Gardiner Sleeper, M.D., Ph.D. Diseases & Surgery of the Retina and Vitreous [facility]  I have reviewed the above documentation for accuracy and completeness, and I agree with the above. Gardiner Sleeper, M.D., Ph.D. 08/10/18 12:11 PM    Abbreviations: M myopia (nearsighted); A astigmatism; H hyperopia (farsighted); P presbyopia; Mrx spectacle prescription;  CTL contact lenses; OD right eye; OS left eye; OU both eyes  XT exotropia; ET esotropia; PEK punctate epithelial keratitis; PEE punctate epithelial erosions; DES dry eye syndrome; MGD meibomian gland dysfunction; ATs artificial tears; PFAT's preservative free artificial tears; [place] nuclear sclerotic cataract; PSC posterior subcapsular  cataract; ERM epi-retinal membrane; PVD posterior vitreous detachment; RD retinal detachment; DM diabetes mellitus; DR diabetic retinopathy; NPDR non-proliferative diabetic retinopathy; PDR proliferative diabetic retinopathy; CSME clinically significant macular edema; DME diabetic macular edema; dbh dot blot hemorrhages; CWS cotton wool spot; POAG primary open angle glaucoma; C/D cup-to-disc ratio; HVF humphrey visual field; GVF goldmann visual field; OCT optical coherence tomography; IOP intraocular  pressure; BRVO Branch retinal vein occlusion; CRVO central retinal vein occlusion; CRAO central retinal artery occlusion; BRAO branch retinal artery occlusion; RT retinal tear; SB scleral buckle; PPV pars plana vitrectomy; VH Vitreous hemorrhage; PRP panretinal laser photocoagulation; IVK intravitreal kenalog; VMT vitreomacular traction; MH Macular hole;  NVD neovascularization of the disc; NVE neovascularization elsewhere; AREDS age related eye disease study; ARMD age related macular degeneration; POAG primary open angle glaucoma; EBMD epithelial/anterior basement membrane dystrophy; ACIOL anterior chamber intraocular lens; IOL intraocular lens; PCIOL posterior chamber intraocular lens; Phaco/IOL phacoemulsification with intraocular lens placement; [place] photorefractive keratectomy; LASIK laser assisted in situ keratomileusis; HTN hypertension; DM diabetes mellitus; COPD chronic obstructive pulmonary disease

## 2018-08-09 ENCOUNTER — Ambulatory Visit (INDEPENDENT_AMBULATORY_CARE_PROVIDER_SITE_OTHER): Payer: Medicare (Managed Care) | Admitting: Ophthalmology

## 2018-08-09 ENCOUNTER — Encounter (INDEPENDENT_AMBULATORY_CARE_PROVIDER_SITE_OTHER): Payer: Self-pay | Admitting: Ophthalmology

## 2018-08-09 DIAGNOSIS — H25813 Combined forms of age-related cataract, bilateral: Secondary | ICD-10-CM

## 2018-08-09 DIAGNOSIS — H35033 Hypertensive retinopathy, bilateral: Secondary | ICD-10-CM

## 2018-08-09 DIAGNOSIS — H3581 Retinal edema: Secondary | ICD-10-CM | POA: Diagnosis not present

## 2018-08-09 DIAGNOSIS — I10 Essential (primary) hypertension: Secondary | ICD-10-CM

## 2018-08-09 DIAGNOSIS — E113513 Type 2 diabetes mellitus with proliferative diabetic retinopathy with macular edema, bilateral: Secondary | ICD-10-CM | POA: Diagnosis not present

## 2018-08-09 DIAGNOSIS — H35373 Puckering of macula, bilateral: Secondary | ICD-10-CM

## 2018-08-09 MED ORDER — PREDNISOLONE ACETATE 1 % OP SUSP: 1.0000 [drp] | Freq: Four times a day (QID) | OPHTHALMIC | 0 refills | Status: AC

## 2018-08-21 NOTE — Progress Notes (Signed)
[facility] Note  08/23/2018     CHIEF COMPLAINT Patient presents for Retina Follow Up   HISTORY OF PRESENT ILLNESS: John Jacobson is a 63 y.o. male who presents to the clinic today for:   HPI    Retina Follow Up    Patient presents with  Diabetic Retinopathy.  In both eyes.  Severity is moderate.  Duration of 2 weeks.  I, the attending physician,  performed the HPI with the patient and updated documentation appropriately.          Comments    2 week f/u for PDR with DME OU, pt states [facility] the same       Last edited by Bernarda Caffey, MD on 08/26/2018 11:32 AM. (History)    pt states vision is the same, right eye may be a little better  Referring physician: Angelica Pou, [place],  [address]  HISTORICAL INFORMATION:   Selected notes from the MEDICAL RECORD NUMBER Referred by Dr. Dorian Pod [facility]) for DM exam LEE:  Ocular Hx- PMH-    CURRENT MEDICATIONS: No current outpatient medications on file. (Ophthalmic Drugs)   No current facility-administered medications for this visit.  (Ophthalmic Drugs)   Current Outpatient Medications (Other)  Medication Sig  . acetaminophen (TYLENOL) 500 MG tablet Take 500 mg by mouth 2 (two) times daily as needed (for pain or headaches).  Marland Kitchen amoxicillin-clavulanate (AUGMENTIN) 875-125 MG tablet Take 1 tablet by mouth every 12 (twelve) hours.  Marland Kitchen aspirin EC 325 MG EC tablet Take 1 tablet (325 mg total) by mouth daily.  Marland Kitchen atorvastatin (LIPITOR) 20 MG tablet Take 20 mg by mouth daily.  . carvedilol (COREG) 25 MG tablet Take 12.5 mg by mouth every 12 (twelve) hours.  . Cholecalciferol (VITAMIN D3) 2000 units TABS Take 4,000 Units by mouth daily.  . cloNIDine (CATAPRES) 0.3 MG tablet Take 1 tablet (0.3 mg total) by mouth 3 (three) times daily.  . clopidogrel (PLAVIX) 75 MG tablet Take 75 mg by mouth daily.  . Cyanocobalamin (VITAMIN B-12 PO) Take 1 tablet by mouth daily.   Marland Kitchen doxazosin (CARDURA) 8 MG tablet Take 4 mg by mouth 2 (two) times daily.  . ferrous sulfate 325 (65 FE) MG tablet Take 325 mg by mouth every Monday, Wednesday, and Friday.  . furosemide (LASIX) 40 MG tablet Take 0.5 tablets (20 mg total) by mouth daily.  Marland Kitchen glucose 4 GM chewable tablet Chew 1 tablet by mouth daily as needed for low blood sugar.  . hydrALAZINE (APRESOLINE) 100 MG tablet Take 1 tablet (100 mg total) by mouth 3 (three) times daily.  Marland Kitchen HYDROcodone-acetaminophen (NORCO/VICODIN) 5-325 MG tablet Take 1 tablet by mouth every 6 (six) hours as needed.  . insulin glargine (LANTUS) 100 unit/mL SOPN Inject 0.3 mLs (30 Units total) into the skin at bedtime.  . isosorbide mononitrate (IMDUR) 60 MG 24 hr tablet Take 1 tablet (60 mg total) by mouth daily.  . montelukast (SINGULAIR) 10 MG tablet Take 10 mg by mouth at bedtime.  . polyethylene glycol (MIRALAX / GLYCOLAX) packet Take 17 g by mouth daily. (Patient taking differently: Take 17 g by mouth daily as needed for mild constipation. )  . potassium chloride (K-DUR) 10 MEQ tablet Take 1 tablet (10 mEq total) by mouth daily.  Marland Kitchen senna (SENOKOT) 8.6 MG TABS tablet Take 1 tablet (8.6 mg total) by mouth daily. (Patient taking differently: Take 1 tablet by mouth  daily as needed for mild constipation. )  . Skin Protectants, Misc. (EUCERIN) cream Apply 1 application topically See admin instructions. Apply to both feet daily after showering   Current Facility-Administered Medications (Other)  Medication Route  . Bevacizumab (AVASTIN) SOLN 1.25 mg Intravitreal  . Bevacizumab (AVASTIN) SOLN 1.25 mg Intravitreal  . Bevacizumab (AVASTIN) SOLN 1.25 mg Intravitreal  . Bevacizumab (AVASTIN) SOLN 1.25 mg Intravitreal      REVIEW OF SYSTEMS: ROS    Positive for: Endocrine, Cardiovascular, Eyes   Negative for: Constitutional, Gastrointestinal, Neurological, Skin, Genitourinary, Musculoskeletal, HENT, Respiratory, Psychiatric, Allergic/Imm, Heme/Lymph    Last edited by Debbrah Alar, COT on 08/23/2018 10:53 AM. (History)       ALLERGIES Allergies  Allergen Reactions  . Amlodipine Besy-Benazepril Hcl Anaphylaxis, Shortness Of Breath and Swelling    Mouth and tongue swelling  . Shellfish Allergy Anaphylaxis, Shortness Of Breath and Swelling    PAST MEDICAL HISTORY Past Medical History:  Diagnosis Date  . Asthma   . Back pain   . CHF (congestive heart failure) ([place])   . Chronic kidney disease   . DM (diabetes mellitus) ([place])   . Hepatitis C   . HTN (hypertension)   . RBBB (right bundle branch block)   . Stroke [facility]) 2018   Past Surgical History:  Procedure Laterality Date  . BACK SURGERY  1995  . PARS PLANA VITRECTOMY Left 01/25/2017  . TOE AMPUTATION  2016    FAMILY HISTORY Family History  Problem Relation Age of Onset  . Coronary artery disease Mother        MI in her 53s  . Diabetes Mother   . Macular degeneration Mother   . Hypertension Other   . Diabetes Other   . Alzheimer's disease Other   . Coronary artery disease Brother   . Diabetes Brother   . Diabetes Sister     SOCIAL HISTORY Social History   Tobacco Use  . Smoking status: Former Smoker    Last attempt to quit: 01/07/2018    Years since quitting: 0.6  . Smokeless tobacco: Never Used  Substance Use Topics  . Alcohol use: No    Comment: Few beers every other day hx  . Drug use: No         OPHTHALMIC EXAM:  Base Eye Exam    Visual Acuity (Snellen - Linear)      Right Left   Dist [place] 20/60 -3 20/60 -2   Dist ph [place] 20/50 -1 20/40 -2       Tonometry (Tonopen, 9:42)      Right Left   Pressure 15 16       Pupils      Dark Light Shape React   Right 3 2 Round Slow   Left 3 3 Round Slow       Visual Fields (Counting fingers)      Left Right    Full Full       Extraocular Movement      Right Left    Full, Ortho Full, Ortho       Neuro/Psych    Oriented x3:  Yes   Mood/Affect:  Normal       Dilation    Both eyes:  1.0%  Mydriacyl, 2.5% Phenylephrine @ 9:42 AM        Slit Lamp and Fundus Exam    Slit Lamp Exam      Right Left   Lids/Lashes Dermatochalasis - upper lid, mild Meibomian gland  dysfunction Dermatochalasis - upper lid, mild Meibomian gland dysfunction   Conjunctiva/Sclera nasal Pinguecula, Melanosis nasal/temporal Pinguecula, Melanosis   Cornea 1+ Punctate epithelial erosions 3+ Punctate epithelial erosions   Anterior Chamber deep, narrow temporal angle deep, narrow temporal angle   Iris Round and dilated, No NVI Round and dilated, No NVI   Lens 2+ Nuclear sclerosis, 2+ Cortical cataract 2-3+ Nuclear sclerosis, 2-3+ Cortical cataract, 1+ Posterior subcapsular cataract   Vitreous Vitreous syneresis post vitrectomy       Fundus Exam      Right Left   Disc Pink and Sharp, sharp rim mild pallor sharp rim   C/D Ratio 0.4 0.5   Macula Blunted foveal reflex, scattered IRH, +Epiretinal membrane, +edema Blunted foveal reflex, scattered Microaneurysms, cluster of IRH temporal macula, temporal macula very ischemic   Vessels Vascular attenuation, scleriatic attenules Vascular attenuation   Periphery Attached, 360 PRP, good early laser fill in changes, scattered IRH Attached, good 360 PRP in place        Refraction    Manifest Refraction      Sphere Cylinder Axis   Right +1.00 +0.75 015   Left     Attempted OS refraction - difficult - dim reflex (DB)          IMAGING AND PROCEDURES  Imaging and Procedures for @TODAY @  OCT, Retina - OU - Both Eyes       Right Eye Quality was good. Central Foveal Thickness: 281. Progression has worsened. Findings include abnormal foveal contour, epiretinal membrane, no SRF, outer retinal atrophy, intraretinal fluid, inner retinal atrophy (Interval increase in IRF/patchy ORA).   Left Eye Quality was good. Central Foveal Thickness: 262. Progression has worsened. Findings include abnormal foveal contour, intraretinal fluid, no SRF, epiretinal membrane, outer  retinal atrophy, inner retinal atrophy (Interval decrease in nasal IRF/interval increase in temporal IRF).   Notes *Images captured and stored on drive  Diagnosis / Impression:  OU: ERM, diffuse atrophy, +DME OD: interval increase in IRF ST macula OS: Interval decrease in nasal IRF/interval increase in temporal IRF  Clinical management:  See below  Abbreviations: NFP - Normal foveal profile. CME - cystoid macular edema. PED - pigment epithelial detachment. IRF - intraretinal fluid. SRF - subretinal fluid. EZ - ellipsoid zone. ERM - epiretinal membrane. ORA - outer retinal atrophy. ORT - outer retinal tubulation. SRHM - subretinal hyper-reflective material        Intravitreal Injection, Pharmacologic Agent - OD - Right Eye       Time Out 08/23/2018. 10:50 AM. Confirmed correct patient, procedure, site, and patient consented.   Anesthesia Topical anesthesia was used. Anesthetic medications included Proparacaine 0.5%, Lidocaine 4%.   Procedure Preparation included 5% betadine to ocular surface, eyelid speculum. A supplied needle was used.   Injection:  1.25 mg Bevacizumab (AVASTIN) SOLN   NDC: 70360-001-02, Lot: [id number] , Expiration date: 10/10/2018   Route: Intravitreal, Site: Right Eye, Waste: 0 mg  Post-op Post injection exam found visual acuity of at least counting fingers. The patient tolerated the procedure well. There were no complications. The patient received written and verbal post procedure care education.        Intravitreal Injection, Pharmacologic Agent - OS - Left Eye       Time Out 08/23/2018. 10:53 AM. Confirmed correct patient, procedure, site, and patient consented.   Anesthesia Subconjunctival anesthesia was used. Anesthetic medications included Lidocaine 4%, Tetracaine 0.5%.   Procedure Preparation included 5% betadine to ocular surface, eyelid speculum. A 30 gauge needle was  used.   Injection:  1.25 mg Bevacizumab (AVASTIN) SOLN   NDC:  70360-001-02, Lot: [id number] , Expiration date: 11/23/2018   Route: Intravitreal, Site: Left Eye, Waste: 0 mg  Post-op Post injection exam found visual acuity of at least counting fingers. The patient tolerated the procedure well. There were no complications. The patient received written and verbal post procedure care education.                 ASSESSMENT/PLAN:    ICD-10-CM   1. Proliferative diabetic retinopathy of both eyes with macular edema associated with type 2 diabetes mellitus (HCC) 19/05/2019 Intravitreal Injection, Pharmacologic Agent - OD - Right Eye    Intravitreal Injection, Pharmacologic Agent - OS - Left Eye    Bevacizumab (AVASTIN) SOLN 1.25 mg    Bevacizumab (AVASTIN) SOLN 1.25 mg  2. Retinal edema H35.81 OCT, Retina - OU - Both Eyes  3. Epiretinal membrane (ERM) of both eyes H35.373   4. Essential hypertension I10   5. Hypertensive retinopathy of both eyes H35.033   6. 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 Proliferative diabetic retinopathy w/ DME, OU - former pt of O67.1245 at [facility] and [facility] at [place] -- known history of proliferative diabetic retinopathy - history PPV OS and laser PRP OD with Dr. Se[place] for PDR OU w/ TRD OS -- 01/25/17 - history of intravitreal anti-VEGF therapy with Dr. 02/07/17 -- last IVE ~01/2018 - The incidence, risk factors for progression, natural history and treatment options for diabetic retinopathy were discussed with patient.   - The need for close monitoring of blood glucose, blood pressure, and serum lipids, avoiding cigarette or any type of tobacco, and the need for long term follow up was also discussed with patient. - exam shows +fine NVD OD, PRP laser in place OU - OCT shows focal diabetic macular edema, both eyes  - FA 2.13.2020 shows active NVE and NVD OD; significant vascular perfusion defects OS - May benefit from PRP fill in OU -- OD more critical - discussed findings  and prognosis - s/p IVA OU #1 (02.13.20) - s/p PRP fill in OD (02.27.20) -- good fill in laser in place - NVD regressing OD - recommend IVA OU #2 today, 03.12.20 - pt wishes to proceed - RBA of procedure discussed, questions answered - informed consent obtained and signed - see procedure note - f/u 4 weeks, DFE/OCT/possible injections  3. Epiretinal membrane, both eyes  - The natural history, anatomy, potential for loss of vision, and treatment options including vitrectomy techniques and the complications of endophthalmitis, retinal detachment, vitreous hemorrhage, cataract progression and permanent vision loss discussed with the patient. - mild ERM - asymptomatic, no metamorphopsia - no indication for surgery at this time - monitor for now  4,5. Hypertensive retinopathy OU - discussed importance of tight BP control - monitor  6. Combined form cataract OU  - The symptoms of cataract, surgical options, and treatments and risks were discussed with patient. - discussed diagnosis and progression - OS > OD, likely sequela from PPV OS - approaching visual significance - monitor for now   Ophthalmic Meds Ordered this visit:  Meds ordered this encounter  Medications  . Bevacizumab (AVASTIN) SOLN 1.25 mg  . Bevacizumab (AVASTIN) SOLN 1.25 mg       Return in about 4 weeks (around 09/20/2018) for f/u PDR OU, DFE, OCT.  There are no Patient Instructions on file for this visit.  Explained the diagnoses, plan, and follow up with the patient and they expressed understanding.  Patient expressed understanding of the importance of proper follow up care.   This document serves as a record of services personally performed by Gardiner Sleeper, MD, PhD. It was created on their behalf by Ernest Mallick, OA, an ophthalmic assistant. The creation of this record is the provider's dictation and/or activities during the visit.    Electronically signed by: Ernest Mallick, OA  03.10.2020 11:36 AM     Gardiner Sleeper, M.D., Ph.D. Diseases & Surgery of the Retina and Vitreous [facility]  I have reviewed the above documentation for accuracy and completeness, and I agree with the above. Gardiner Sleeper, M.D., Ph.D. 08/26/18 11:36 AM    Abbreviations: M myopia (nearsighted); A astigmatism; H hyperopia (farsighted); P presbyopia; Mrx spectacle prescription;  CTL contact lenses; OD right eye; OS left eye; OU both eyes  XT exotropia; ET esotropia; PEK punctate epithelial keratitis; PEE punctate epithelial erosions; DES dry eye syndrome; MGD meibomian gland dysfunction; ATs artificial tears; PFAT's preservative free artificial tears; [place] nuclear sclerotic cataract; PSC posterior subcapsular cataract; ERM epi-retinal membrane; PVD posterior vitreous detachment; RD retinal detachment; DM diabetes mellitus; DR diabetic retinopathy; NPDR non-proliferative diabetic retinopathy; PDR proliferative diabetic retinopathy; CSME clinically significant macular edema; DME diabetic macular edema; dbh dot blot hemorrhages; CWS cotton wool spot; POAG primary open angle glaucoma; C/D cup-to-disc ratio; HVF humphrey visual field; GVF goldmann visual field; OCT optical coherence tomography; IOP intraocular pressure; BRVO Branch retinal vein occlusion; CRVO central retinal vein occlusion; CRAO central retinal artery occlusion; BRAO branch retinal artery occlusion; RT retinal tear; SB scleral buckle; PPV pars plana vitrectomy; VH Vitreous hemorrhage; PRP panretinal laser photocoagulation; IVK intravitreal kenalog; VMT vitreomacular traction; MH Macular hole;  NVD neovascularization of the disc; NVE neovascularization elsewhere; AREDS age related eye disease study; ARMD age related macular degeneration; POAG primary open angle glaucoma; EBMD epithelial/anterior basement membrane dystrophy; ACIOL anterior chamber intraocular lens; IOL intraocular lens; PCIOL posterior chamber intraocular lens; Phaco/IOL  phacoemulsification with intraocular lens placement; [place] photorefractive keratectomy; LASIK laser assisted in situ keratomileusis; HTN hypertension; DM diabetes mellitus; COPD chronic obstructive pulmonary disease

## 2018-08-23 ENCOUNTER — Other Ambulatory Visit: Payer: Self-pay

## 2018-08-23 ENCOUNTER — Ambulatory Visit (INDEPENDENT_AMBULATORY_CARE_PROVIDER_SITE_OTHER): Payer: Medicare (Managed Care) | Admitting: Ophthalmology

## 2018-08-23 DIAGNOSIS — H35033 Hypertensive retinopathy, bilateral: Secondary | ICD-10-CM

## 2018-08-23 DIAGNOSIS — H3581 Retinal edema: Secondary | ICD-10-CM | POA: Diagnosis not present

## 2018-08-23 DIAGNOSIS — H25813 Combined forms of age-related cataract, bilateral: Secondary | ICD-10-CM

## 2018-08-23 DIAGNOSIS — I10 Essential (primary) hypertension: Secondary | ICD-10-CM

## 2018-08-23 DIAGNOSIS — H35373 Puckering of macula, bilateral: Secondary | ICD-10-CM | POA: Diagnosis not present

## 2018-08-23 DIAGNOSIS — E113513 Type 2 diabetes mellitus with proliferative diabetic retinopathy with macular edema, bilateral: Secondary | ICD-10-CM | POA: Diagnosis not present

## 2018-08-24 MED ORDER — BEVACIZUMAB CHEMO INJECTION 1.25MG/0.05ML SYRINGE FOR KALEIDOSCOPE
1.2500 mg | INTRAVITREAL | Status: DC
  Administered 2018-08-24: 1.25 mg via INTRAVITREAL

## 2018-08-26 ENCOUNTER — Encounter (INDEPENDENT_AMBULATORY_CARE_PROVIDER_SITE_OTHER): Payer: Self-pay | Admitting: Ophthalmology

## 2018-09-21 ENCOUNTER — Encounter (INDEPENDENT_AMBULATORY_CARE_PROVIDER_SITE_OTHER): Payer: Medicare (Managed Care) | Admitting: Ophthalmology

## 2018-10-21 NOTE — Progress Notes (Addendum)
[facility] Note  10/22/2018     CHIEF COMPLAINT Patient presents for Retina Evaluation   HISTORY OF PRESENT ILLNESS: John Jacobson is a 63 y.o. male who presents to the clinic today for:   HPI    Retina Evaluation    In both eyes.  This started weeks ago.  Duration of weeks.  Associated Symptoms Floaters.  I, the attending physician,  performed the HPI with the patient and updated documentation appropriately.          Comments    Patient complains of his vision being cloudy today.  Patient has had some new floaters in his left eye that come and go.  Patient denies eye pain or discomfort.  Patient denies flashes of light.       Last edited by Bernarda Caffey, MD on 10/22/2018  8:13 AM. (History)    pt states he has noticed a decrease in [place], he states there appears to be a film over his eyes  Referring physician: Angelica Pou, MD [place], [place] [address]  HISTORICAL INFORMATION:   Selected notes from the MEDICAL RECORD NUMBER Referred by Dr. Dorian Pod [facility]) for DM exam LEE:  Ocular Hx- PMH-    CURRENT MEDICATIONS: No current outpatient medications on file. (Ophthalmic Drugs)   No current facility-administered medications for this visit.  (Ophthalmic Drugs)   Current Outpatient Medications (Other)  Medication Sig  . acetaminophen (TYLENOL) 500 MG tablet Take 500 mg by mouth 2 (two) times daily as needed (for pain or headaches).  Marland Kitchen amoxicillin-clavulanate (AUGMENTIN) 875-125 MG tablet Take 1 tablet by mouth every 12 (twelve) hours.  Marland Kitchen aspirin EC 325 MG EC tablet Take 1 tablet (325 mg total) by mouth daily.  Marland Kitchen atorvastatin (LIPITOR) 20 MG tablet Take 20 mg by mouth daily.  . carvedilol (COREG) 25 MG tablet Take 12.5 mg by mouth every 12 (twelve) hours.  . Cholecalciferol (VITAMIN D3) 2000 units TABS Take 4,000 Units by mouth daily.  . cloNIDine (CATAPRES) 0.3 MG tablet Take 1 tablet (0.3 mg total)  by mouth 3 (three) times daily.  . clopidogrel (PLAVIX) 75 MG tablet Take 75 mg by mouth daily.  . Cyanocobalamin (VITAMIN B-12 PO) Take 1 tablet by mouth daily.  Marland Kitchen doxazosin (CARDURA) 8 MG tablet Take 4 mg by mouth 2 (two) times daily.  . ferrous sulfate 325 (65 FE) MG tablet Take 325 mg by mouth every Monday, Wednesday, and Friday.  . furosemide (LASIX) 40 MG tablet Take 0.5 tablets (20 mg total) by mouth daily.  Marland Kitchen glucose 4 GM chewable tablet Chew 1 tablet by mouth daily as needed for low blood sugar.  . hydrALAZINE (APRESOLINE) 100 MG tablet Take 1 tablet (100 mg total) by mouth 3 (three) times daily.  Marland Kitchen HYDROcodone-acetaminophen (NORCO/VICODIN) 5-325 MG tablet Take 1 tablet by mouth every 6 (six) hours as needed.  . insulin glargine (LANTUS) 100 unit/mL SOPN Inject 0.3 mLs (30 Units total) into the skin at bedtime.  . isosorbide mononitrate (IMDUR) 60 MG 24 hr tablet Take 1 tablet (60 mg total) by mouth daily.  . montelukast (SINGULAIR) 10 MG tablet Take 10 mg by mouth at bedtime.  . polyethylene glycol (MIRALAX / GLYCOLAX) packet Take 17 g by mouth daily. (Patient taking differently: Take 17 g by mouth daily as needed for mild constipation. )  . potassium chloride (K-DUR) 10 MEQ tablet Take 1 tablet (10 mEq total) by  mouth daily.  Marland Kitchen senna (SENOKOT) 8.6 MG TABS tablet Take 1 tablet (8.6 mg total) by mouth daily. (Patient taking differently: Take 1 tablet by mouth daily as needed for mild constipation. )  . Skin Protectants, Misc. (EUCERIN) cream Apply 1 application topically See admin instructions. Apply to both feet daily after showering   Current Facility-Administered Medications (Other)  Medication Route  . Bevacizumab (AVASTIN) SOLN 1.25 mg Intravitreal  . Bevacizumab (AVASTIN) SOLN 1.25 mg Intravitreal  . Bevacizumab (AVASTIN) SOLN 1.25 mg Intravitreal  . Bevacizumab (AVASTIN) SOLN 1.25 mg Intravitreal      REVIEW OF SYSTEMS: ROS    Positive for: Endocrine, Cardiovascular, Eyes    Negative for: Constitutional, Gastrointestinal, Neurological, Skin, Genitourinary, Musculoskeletal, HENT, Respiratory, Psychiatric, Allergic/Imm, Heme/Lymph   Last edited by Doneen Poisson on 10/22/2018  7:47 AM. (History)       ALLERGIES Allergies  Allergen Reactions  . Amlodipine Besy-Benazepril Hcl Anaphylaxis, Shortness Of Breath and Swelling    Mouth and tongue swelling  . Shellfish Allergy Anaphylaxis, Shortness Of Breath and Swelling    PAST MEDICAL HISTORY Past Medical History:  Diagnosis Date  . Asthma   . Back pain   . CHF (congestive heart failure) ([place])   . Chronic kidney disease   . DM (diabetes mellitus) ([place])   . Hepatitis C   . HTN (hypertension)   . RBBB (right bundle branch block)   . Stroke [facility]) 2018   Past Surgical History:  Procedure Laterality Date  . BACK SURGERY  1995  . PARS PLANA VITRECTOMY Left 01/25/2017  . TOE AMPUTATION  2016    FAMILY HISTORY Family History  Problem Relation Age of Onset  . Coronary artery disease Mother        MI in her 5s  . Diabetes Mother   . Macular degeneration Mother   . Hypertension Other   . Diabetes Other   . Alzheimer's disease Other   . Coronary artery disease Brother   . Diabetes Brother   . Diabetes Sister     SOCIAL HISTORY Social History   Tobacco Use  . Smoking status: Former Smoker    Last attempt to quit: 01/07/2018    Years since quitting: 0.7  . Smokeless tobacco: Never Used  Substance Use Topics  . Alcohol use: No    Comment: Few beers every other day hx  . Drug use: No         OPHTHALMIC EXAM:  Base Eye Exam    Visual Acuity (Snellen - Linear)      Right Left   Dist [place] 20/80 -2 20/80 -2   Dist ph [place] 20/60 -3 20/50       Tonometry (Tonopen, 7:58 AM)      Right Left   Pressure 15 12       Pupils      Dark Light Shape React APD   Right 3 2 Round Brisk 0   Left 3 2 Round Brisk 0       Extraocular Movement      Right Left    Full Full       Neuro/Psych     Oriented x3:  Yes   Mood/Affect:  Normal       Dilation    Both eyes:  1.0% Mydriacyl, 2.5% Phenylephrine @ 7:58 AM        Slit Lamp and Fundus Exam    Slit Lamp Exam      Right Left   Lids/Lashes Dermatochalasis -  upper lid, mild Meibomian gland dysfunction Dermatochalasis - upper lid, mild Meibomian gland dysfunction   Conjunctiva/Sclera nasal Pinguecula, Melanosis nasal/temporal Pinguecula, Melanosis   Cornea 1-2+ Punctate epithelial erosions 3+ inferior Punctate epithelial erosions   Anterior Chamber deep, narrow temporal angle deep, narrow temporal angle   Iris Round and dilated, No NVI Round and dilated, No NVI   Lens 2+ Nuclear sclerosis, 2+ Cortical cataract 2-3+ Nuclear sclerosis, 2-3+ Cortical cataract, 1+ Posterior subcapsular cataract   Vitreous Vitreous syneresis post vitrectomy       Fundus Exam      Right Left   Disc Pink, sharp rim mild pallor, sharp rim   C/D Ratio 0.4 0.5   Macula Blunted foveal reflex, scattered MA, +Epiretinal membrane, +edema, scattered exudates Blunted foveal reflex, scattered Microaneurysms, cluster of IRH with slightly increased edema temporal macula, temporal macula very ischemic   Vessels Vascular attenuation, sclerotic arterioles, +AV crossing changes Vascular attenuation   Periphery Attached, 360 PRP, good posterior laser fill in, scattered IRH Attached, good 360 PRP in place        Refraction    Manifest Refraction (Retinoscopy)      Sphere Cylinder Dist VA   Right +1.00 Sphere 20/60+1   Left -0.50 Sphere 20/70  Difficult refraction.  Inconsistency in answers.          IMAGING AND PROCEDURES  Imaging and Procedures for @TODAY @  OCT, Retina - OU - Both Eyes       Right Eye Quality was good. Central Foveal Thickness: 331. Progression has worsened. Findings include abnormal foveal contour, epiretinal membrane, no SRF, outer retinal atrophy, intraretinal fluid, macular pucker (Interval increase in IRF).   Left Eye Quality  was good. Central Foveal Thickness: 298. Progression has worsened. Findings include abnormal foveal contour, intraretinal fluid, no SRF, epiretinal membrane, outer retinal atrophy, inner retinal atrophy (Interval increase in IRF).   Notes *Images captured and stored on drive  Diagnosis / Impression:  OU: ERM, diffuse atrophy, +DME OD: interval increase in IRF ST macula OS: Interval increase in IRF  Clinical management:  See below  Abbreviations: NFP - Normal foveal profile. CME - cystoid macular edema. PED - pigment epithelial detachment. IRF - intraretinal fluid. SRF - subretinal fluid. EZ - ellipsoid zone. ERM - epiretinal membrane. ORA - outer retinal atrophy. ORT - outer retinal tubulation. SRHM - subretinal hyper-reflective material        Intravitreal Injection, Pharmacologic Agent - OD - Right Eye       Time Out 10/22/2018. 8:33 AM. Confirmed correct patient, procedure, site, and patient consented.   Anesthesia Topical anesthesia was used. Anesthetic medications included Lidocaine 2%, Proparacaine 0.5%.   Procedure Preparation included 5% betadine to ocular surface, eyelid speculum. A supplied needle was used.   Injection:  1.25 mg Bevacizumab (AVASTIN) SOLN   NDC: 16109-604-54, Lot: [id number] , Expiration date: 11/19/2018   Route: Intravitreal, Site: Right Eye, Waste: 0 mL  Post-op Post injection exam found visual acuity of at least counting fingers. The patient tolerated the procedure well. There were no complications. The patient received written and verbal post procedure care education.        Intravitreal Injection, Pharmacologic Agent - OS - Left Eye       Time Out 10/22/2018. 8:33 AM. Confirmed correct patient, procedure, site, and patient consented.   Anesthesia Topical anesthesia was used. Anesthetic medications included Lidocaine 2%, Proparacaine 0.5%.   Procedure Preparation included 5% betadine to ocular surface, eyelid speculum. A 30 gauge needle  was  used.   Injection:  1.25 mg Bevacizumab (AVASTIN) SOLN   NDC: 25003-704-88, Lot: [id number] , Expiration date: 11/23/2018   Route: Intravitreal, Site: Left Eye, Waste: 0 mL  Post-op Post injection exam found visual acuity of at least counting fingers. The patient tolerated the procedure well. There were no complications. The patient received written and verbal post procedure care education.                 ASSESSMENT/PLAN:    ICD-10-CM   1. Proliferative diabetic retinopathy of both eyes with macular edema associated with type 2 diabetes mellitus (HCC) 19/05/2019 Intravitreal Injection, Pharmacologic Agent - OD - Right Eye    Intravitreal Injection, Pharmacologic Agent - OS - Left Eye    Bevacizumab (AVASTIN) SOLN 1.25 mg    Bevacizumab (AVASTIN) SOLN 1.25 mg  2. Retinal edema H35.81 OCT, Retina - OU - Both Eyes  3. Epiretinal membrane (ERM) of both eyes H35.373   4. Essential hypertension I10   5. Hypertensive retinopathy of both eyes H35.033   6. 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 Proliferative diabetic retinopathy w/ DME, OU  - former pt of M62.8638 at [facility] and [facility] at [place] -- known history of proliferative diabetic retinopathy  - history PPV OS and laser PRP OD with Dr. Se[place] for PDR OU w/ TRD OS -- 01/25/17  - history of intravitreal anti-VEGF therapy with Dr. 02/07/17 -- last IVE ~01/2018  - The incidence, risk factors for progression, natural history and treatment options for diabetic retinopathy were discussed with patient.    - The need for close monitoring of blood glucose, blood pressure, and serum lipids, avoiding cigarette or any type of tobacco, and the need for long term follow up was also discussed with patient.  - s/p IVA OU #1 (02.13.20), #2 (03.12.20)  - s/p PRP fill in OD (02.27.20) -- good fill in laser in place  - exam shows regression of fine NVD OD, PRP laser in place OU  - FA 2.13.2020 shows  active NVE and NVD OD; significant vascular perfusion defects OS  - pt w/ delayed f/u to 8 weeks instead of 4 weeks  - OCT shows interval increase in focal diabetic macular edema, both eyes   - BCVA slightly decreased to 20/60 from 20/50 OD and slightly decreased to 20/50 from 20/40 OS  - discussed findings and prognosis  - recommend IVA OU #3 today, 05.10.20  - pt wishes to proceed  - RBA of procedure discussed, questions answered  - informed consent obtained and signed  - see procedure note  - f/u 4 weeks, DFE/OCT/possible injections  3. Epiretinal membrane, both eyes   - mild ERM  - asymptomatic, no metamorphopsia  - no indication for surgery at this time  - monitor for now  4,5. Hypertensive retinopathy OU  - discussed importance of tight BP control  - monitor  6. Combined form cataract OU   - The symptoms of cataract, surgical options, and treatments and risks were discussed with patient.  - discussed diagnosis and progression  - OS > OD, likely sequela from PPV OS  - approaching visual significance  - monitor for now  7. Dry eyes OU - recommend artificial tears and lubricating ointment as needed    Ophthalmic Meds Ordered this visit:  Meds ordered this encounter  Medications  . Bevacizumab (AVASTIN) SOLN 1.25 mg  . Bevacizumab (AVASTIN) SOLN 1.25 mg  Return in about 4 weeks (around 11/19/2018) for f/u PDR w/ DME OU - Dilated Exam, OCT, Possible Injxn.  There are no Patient Instructions on file for this visit.   Explained the diagnoses, plan, and follow up with the patient and they expressed understanding.  Patient expressed understanding of the importance of proper follow up care.   This document serves as a record of services personally performed by Gardiner Sleeper, MD, PhD. It was created on their behalf by Ernest Mallick, OA, an ophthalmic assistant. The creation of this record is the provider's dictation and/or activities during the visit.     Electronically signed by: Ernest Mallick, OA  05.10.2020 8:33 AM    Gardiner Sleeper, M.D., Ph.D. Diseases & Surgery of the Retina and Vitreous [facility]  I have reviewed the above documentation for accuracy and completeness, and I agree with the above. Gardiner Sleeper, M.D., Ph.D. 10/22/18 8:46 AM   Abbreviations: M myopia (nearsighted); A astigmatism; H hyperopia (farsighted); P presbyopia; Mrx spectacle prescription;  CTL contact lenses; OD right eye; OS left eye; OU both eyes  XT exotropia; ET esotropia; PEK punctate epithelial keratitis; PEE punctate epithelial erosions; DES dry eye syndrome; MGD meibomian gland dysfunction; ATs artificial tears; PFAT's preservative free artificial tears; [place] nuclear sclerotic cataract; PSC posterior subcapsular cataract; ERM epi-retinal membrane; PVD posterior vitreous detachment; RD retinal detachment; DM diabetes mellitus; DR diabetic retinopathy; NPDR non-proliferative diabetic retinopathy; PDR proliferative diabetic retinopathy; CSME clinically significant macular edema; DME diabetic macular edema; dbh dot blot hemorrhages; CWS cotton wool spot; POAG primary open angle glaucoma; C/D cup-to-disc ratio; HVF humphrey visual field; GVF goldmann visual field; OCT optical coherence tomography; IOP intraocular pressure; BRVO Branch retinal vein occlusion; CRVO central retinal vein occlusion; CRAO central retinal artery occlusion; BRAO branch retinal artery occlusion; RT retinal tear; SB scleral buckle; PPV pars plana vitrectomy; VH Vitreous hemorrhage; PRP panretinal laser photocoagulation; IVK intravitreal kenalog; VMT vitreomacular traction; MH Macular hole;  NVD neovascularization of the disc; NVE neovascularization elsewhere; AREDS age related eye disease study; ARMD age related macular degeneration; POAG primary open angle glaucoma; EBMD epithelial/anterior basement membrane dystrophy; ACIOL anterior chamber intraocular lens; IOL  intraocular lens; PCIOL posterior chamber intraocular lens; Phaco/IOL phacoemulsification with intraocular lens placement; [place] photorefractive keratectomy; LASIK laser assisted in situ keratomileusis; HTN hypertension; DM diabetes mellitus; COPD chronic obstructive pulmonary disease

## 2018-10-22 ENCOUNTER — Encounter (INDEPENDENT_AMBULATORY_CARE_PROVIDER_SITE_OTHER): Payer: Self-pay | Admitting: Ophthalmology

## 2018-10-22 ENCOUNTER — Ambulatory Visit (INDEPENDENT_AMBULATORY_CARE_PROVIDER_SITE_OTHER): Payer: Medicare (Managed Care) | Admitting: Ophthalmology

## 2018-10-22 ENCOUNTER — Other Ambulatory Visit: Payer: Self-pay

## 2018-10-22 DIAGNOSIS — H3581 Retinal edema: Secondary | ICD-10-CM

## 2018-10-22 DIAGNOSIS — E113513 Type 2 diabetes mellitus with proliferative diabetic retinopathy with macular edema, bilateral: Secondary | ICD-10-CM | POA: Diagnosis not present

## 2018-10-22 DIAGNOSIS — H35033 Hypertensive retinopathy, bilateral: Secondary | ICD-10-CM

## 2018-10-22 DIAGNOSIS — I10 Essential (primary) hypertension: Secondary | ICD-10-CM | POA: Diagnosis not present

## 2018-10-22 DIAGNOSIS — H25813 Combined forms of age-related cataract, bilateral: Secondary | ICD-10-CM

## 2018-10-22 DIAGNOSIS — H35373 Puckering of macula, bilateral: Secondary | ICD-10-CM

## 2018-10-22 DIAGNOSIS — H04123 Dry eye syndrome of bilateral lacrimal glands: Secondary | ICD-10-CM

## 2018-10-22 MED ORDER — BEVACIZUMAB CHEMO INJECTION 1.25MG/0.05ML SYRINGE FOR KALEIDOSCOPE
1.2500 mg | INTRAVITREAL | Status: AC | PRN
  Administered 2018-10-22: 1.25 mg via INTRAVITREAL

## 2018-10-22 MED ORDER — CARBOXYMETHYLCELLULOSE SODIUM 1 % OP SOLN: 1.0000 [drp] | Freq: Four times a day (QID) | OPHTHALMIC | 12 refills | Status: DC

## 2018-11-15 NOTE — Progress Notes (Signed)
[facility] Note  11/19/2018     CHIEF COMPLAINT Patient presents for Retina Follow Up   HISTORY OF PRESENT ILLNESS: John Jacobson is a 63 y.o. male who presents to the clinic today for:   HPI    Retina Follow Up    Patient presents with  Diabetic Retinopathy.  In both eyes.  This started 4 months ago.  Severity is mild.  Since onset it is stable.  I, the attending physician,  performed the HPI with the patient and updated documentation appropriately.          Comments    F/u PDR OU. Patient states his vision is "getting better", he occasionally has dry eyes,he applies artifical tears with relief, denies new visual issues. BS 55 (4 days ago), pt does not monitor BS daily. Pt is ready for tx today if indicted.        Last edited by Bernarda Caffey, MD on 11/19/2018 10:05 AM. (History)    pt states he feels like his vision has improved with the use of AT's, he states he is using them twice a day and they do not feel as dry   Referring physician: Angelica Pou, MD [place], [place] [address]  HISTORICAL INFORMATION:   Selected notes from the MEDICAL RECORD NUMBER Referred by Dr. Dorian Pod [facility]) for DM exam LEE:  Ocular Hx- PMH-    CURRENT MEDICATIONS: Current Outpatient Medications (Ophthalmic Drugs)  Medication Sig  . carboxymethylcellulose 1 % ophthalmic solution Place 1 drop into both eyes 4 (four) times daily.   No current facility-administered medications for this visit.  (Ophthalmic Drugs)   Current Outpatient Medications (Other)  Medication Sig  . acetaminophen (TYLENOL) 500 MG tablet Take 500 mg by mouth 2 (two) times daily as needed (for pain or headaches).  Marland Kitchen amoxicillin-clavulanate (AUGMENTIN) 875-125 MG tablet Take 1 tablet by mouth every 12 (twelve) hours.  Marland Kitchen aspirin EC 325 MG EC tablet Take 1 tablet (325 mg total) by mouth daily.  Marland Kitchen atorvastatin (LIPITOR) 20 MG tablet Take 20 mg by mouth  daily.  . carvedilol (COREG) 25 MG tablet Take 12.5 mg by mouth every 12 (twelve) hours.  . Cholecalciferol (VITAMIN D3) 2000 units TABS Take 4,000 Units by mouth daily.  . cloNIDine (CATAPRES) 0.3 MG tablet Take 1 tablet (0.3 mg total) by mouth 3 (three) times daily.  . clopidogrel (PLAVIX) 75 MG tablet Take 75 mg by mouth daily.  . Cyanocobalamin (VITAMIN B-12 PO) Take 1 tablet by mouth daily.  Marland Kitchen doxazosin (CARDURA) 8 MG tablet Take 4 mg by mouth 2 (two) times daily.  . ferrous sulfate 325 (65 FE) MG tablet Take 325 mg by mouth every Monday, Wednesday, and Friday.  . furosemide (LASIX) 40 MG tablet Take 0.5 tablets (20 mg total) by mouth daily.  Marland Kitchen glucose 4 GM chewable tablet Chew 1 tablet by mouth daily as needed for low blood sugar.  . hydrALAZINE (APRESOLINE) 100 MG tablet Take 1 tablet (100 mg total) by mouth 3 (three) times daily.  Marland Kitchen HYDROcodone-acetaminophen (NORCO/VICODIN) 5-325 MG tablet Take 1 tablet by mouth every 6 (six) hours as needed.  . insulin glargine (LANTUS) 100 unit/mL SOPN Inject 0.3 mLs (30 Units total) into the skin at bedtime.  . montelukast (SINGULAIR) 10 MG tablet Take 10 mg by mouth at bedtime.  . polyethylene glycol (MIRALAX / GLYCOLAX) packet Take 17 g by mouth daily. (Patient taking differently:  Take 17 g by mouth daily as needed for mild constipation. )  . potassium chloride (K-DUR) 10 MEQ tablet Take 1 tablet (10 mEq total) by mouth daily.  Marland Kitchen senna (SENOKOT) 8.6 MG TABS tablet Take 1 tablet (8.6 mg total) by mouth daily. (Patient taking differently: Take 1 tablet by mouth daily as needed for mild constipation. )  . Skin Protectants, Misc. (EUCERIN) cream Apply 1 application topically See admin instructions. Apply to both feet daily after showering  . isosorbide mononitrate (IMDUR) 60 MG 24 hr tablet Take 1 tablet (60 mg total) by mouth daily.   Current Facility-Administered Medications (Other)  Medication Route  . Bevacizumab (AVASTIN) SOLN 1.25 mg Intravitreal   . Bevacizumab (AVASTIN) SOLN 1.25 mg Intravitreal  . Bevacizumab (AVASTIN) SOLN 1.25 mg Intravitreal  . Bevacizumab (AVASTIN) SOLN 1.25 mg Intravitreal      REVIEW OF SYSTEMS: ROS    Positive for: Endocrine, Eyes   Negative for: Constitutional, Gastrointestinal, Neurological, Skin, Genitourinary, Musculoskeletal, HENT, Cardiovascular, Respiratory, Psychiatric, Allergic/Imm, Heme/Lymph   Last edited by Zenovia Jordan, LPN on 02/14/7653  6:50 AM. (History)       ALLERGIES Allergies  Allergen Reactions  . Amlodipine Besy-Benazepril Hcl Anaphylaxis, Shortness Of Breath and Swelling    Mouth and tongue swelling  . Shellfish Allergy Anaphylaxis, Shortness Of Breath and Swelling    PAST MEDICAL HISTORY Past Medical History:  Diagnosis Date  . Asthma   . Back pain   . CHF (congestive heart failure) ([place])   . Chronic kidney disease   . DM (diabetes mellitus) ([place])   . Hepatitis C   . HTN (hypertension)   . RBBB (right bundle branch block)   . Stroke [facility]) 2018   Past Surgical History:  Procedure Laterality Date  . BACK SURGERY  1995  . PARS PLANA VITRECTOMY Left 01/25/2017  . TOE AMPUTATION  2016    FAMILY HISTORY Family History  Problem Relation Age of Onset  . Coronary artery disease Mother        MI in her 80s  . Diabetes Mother   . Macular degeneration Mother   . Hypertension Other   . Diabetes Other   . Alzheimer's disease Other   . Coronary artery disease Brother   . Diabetes Brother   . Diabetes Sister     SOCIAL HISTORY Social History   Tobacco Use  . Smoking status: Former Smoker    Last attempt to quit: 01/07/2018    Years since quitting: 0.8  . Smokeless tobacco: Never Used  Substance Use Topics  . Alcohol use: No    Comment: Few beers every other day hx  . Drug use: No         OPHTHALMIC EXAM:  Base Eye Exam    Visual Acuity (Snellen - Linear)      Right Left   Dist [place] 20/50 20/80 -2   Dist ph [place] NI 20/50 -2   Correction:  Glasses        Tonometry (Tonopen, 8:36 AM)      Right Left   Pressure 14 14       Pupils      Dark Light Shape React APD   Right 4 3 Round Brisk None   Left 4 3 Round Brisk None       Visual Fields (Counting fingers)      Left Right    Full Full       Extraocular Movement      Right Left  Full, Ortho Full, Ortho       Neuro/Psych    Oriented x3:  Yes   Mood/Affect:  Normal       Dilation    Both eyes:  1.0% Mydriacyl, 2.5% Phenylephrine @ 8:33 AM        Slit Lamp and Fundus Exam    Slit Lamp Exam      Right Left   Lids/Lashes Dermatochalasis - upper lid, mild Meibomian gland dysfunction Dermatochalasis - upper lid, mild Meibomian gland dysfunction   Conjunctiva/Sclera nasal Pinguecula, Melanosis nasal/temporal Pinguecula, Melanosis   Cornea trace-1+ Punctate epithelial erosions Mild Debris in tear film, 1+ Punctate epithelial erosions nasal and inferior   Anterior Chamber deep, narrow temporal angle deep, narrow temporal angle   Iris Round and dilated, No NVI Round and dilated, No NVI   Lens 2+ Nuclear sclerosis, 2+ Cortical cataract 2-3+ Nuclear sclerosis, 2-3+ Cortical cataract, 1+ Posterior subcapsular cataract   Vitreous Vitreous syneresis post vitrectomy       Fundus Exam      Right Left   Disc Pink, sharp rim mild pallor, sharp rim   C/D Ratio 0.4 0.5   Macula Blunted foveal reflex, scattered MA, +Epiretinal membrane, +edema - mild improvement, scattered exudates Blunted foveal reflex, scattered Microaneurysms, cluster of IRH with slightly improved edema temporal macula, temporal macula very ischemic   Vessels severe Vascular attenuation, sclerotic arterioles, +AV crossing changes Vascular attenuation   Periphery Attached, 360 PRP, good posterior laser fill in, scattered IRH Attached, good 360 PRP in place          IMAGING AND PROCEDURES  Imaging and Procedures for @TODAY @  OCT, Retina - OU - Both Eyes       Right Eye Quality was good. Central Foveal  Thickness: 275. Progression has improved. Findings include abnormal foveal contour, epiretinal membrane, no SRF, outer retinal atrophy, intraretinal fluid, macular pucker (Interval improvement in IRF).   Left Eye Quality was good. Central Foveal Thickness: 275. Progression has improved. Findings include abnormal foveal contour, intraretinal fluid, no SRF, epiretinal membrane, outer retinal atrophy, inner retinal atrophy (Interval improvement in IRF).   Notes *Images captured and stored on drive  Diagnosis / Impression:  OU: ERM, diffuse atrophy, +DME OD: interval improvement in IRF ST macula OS: Interval improvement in IRF  Clinical management:  See below  Abbreviations: NFP - Normal foveal profile. CME - cystoid macular edema. PED - pigment epithelial detachment. IRF - intraretinal fluid. SRF - subretinal fluid. EZ - ellipsoid zone. ERM - epiretinal membrane. ORA - outer retinal atrophy. ORT - outer retinal tubulation. SRHM - subretinal hyper-reflective material        Intravitreal Injection, Pharmacologic Agent - OD - Right Eye       Time Out 11/19/2018. Confirmed correct patient, procedure, site, and patient consented.   Anesthesia Topical anesthesia was used. Anesthetic medications included Lidocaine 2%, Proparacaine 0.5%.   Procedure Preparation included 5% betadine to ocular surface, eyelid speculum. A supplied needle was used.   Injection:  1.25 mg Bevacizumab (AVASTIN) SOLN   NDC: 53299-242-68, Lot: [id number] , Expiration date: 01/07/2019   Route: Intravitreal, Site: Right Eye, Waste: 0 mL  Post-op Post injection exam found visual acuity of at least counting fingers. The patient tolerated the procedure well. There were no complications. The patient received written and verbal post procedure care education.        Intravitreal Injection, Pharmacologic Agent - OS - Left Eye       Time Out 11/19/2018. Confirmed correct  patient, procedure, site, and patient consented.    Anesthesia Topical anesthesia was used. Anesthetic medications included Tetracaine 0.5%, Lidocaine 2%.   Procedure Preparation included 5% betadine to ocular surface, eyelid speculum. A supplied needle was used.   Injection:  1.25 mg Bevacizumab (AVASTIN) SOLN   NDC: 13244-010-27, Lot: [id number] , Expiration date: 01/02/2019   Route: Intravitreal, Site: Left Eye, Waste: 0 mL  Post-op Post injection exam found visual acuity of at least counting fingers. The patient tolerated the procedure well. There were no complications. The patient received written and verbal post procedure care education.                 ASSESSMENT/PLAN:    ICD-10-CM   1. Proliferative diabetic retinopathy of both eyes with macular edema associated with type 2 diabetes mellitus (HCC) 01/15/2019 Intravitreal Injection, Pharmacologic Agent - OD - Right Eye    Intravitreal Injection, Pharmacologic Agent - OS - Left Eye  2. Retinal edema H35.81 OCT, Retina - OU - Both Eyes  3. Epiretinal membrane (ERM) of both eyes H35.373   4. Essential hypertension I10   5. Hypertensive retinopathy of both eyes H35.033   6. Combined forms of age-related cataract of both eyes H25.813   7. Dry eyes H04.123     1,2. Proliferative diabetic retinopathy w/ DME, OU  - former pt of [phone] at [facility] and [facility] at [place] -- known history of proliferative diabetic retinopathy  - history PPV OS and laser PRP OD with Dr. Seiling for PDR OU w/ TRD OS -- 01/25/17  - history of intravitreal anti-VEGF therapy with Dr. 02/07/17 -- last IVE ~01/2018  - The incidence, risk factors for progression, natural history and treatment options for diabetic retinopathy were discussed with patient.    - The need for close monitoring of blood glucose, blood pressure, and serum lipids, avoiding cigarette or any type of tobacco, and the need for long term follow up was also discussed with patient.  - s/p IVA OU #1 (02.13.20), #2  (03.12.20), #3 (05.10.20)  - s/p PRP fill in OD (02.27.20) -- good fill in laser in place  - exam shows regression of fine NVD OD, PRP laser in place OU  - FA 2.13.2020 shows active NVE and NVD OD; significant vascular perfusion defects OS  - OCT shows interval improvement in focal diabetic macular edema, both eyes   - BCVA slightly improved to 20/50 from 20/60 OD and OS stable at 20/50  - discussed findings and prognosis  - recommend IVA OU #4 today, 06.08.20  - pt wishes to proceed  - RBA of procedure discussed, questions answered  - informed consent obtained and signed  - see procedure note  - f/u 4 weeks, DFE/OCT/possible injections  3. Epiretinal membrane, both eyes   - mild ERM  - asymptomatic, no metamorphopsia  - no indication for surgery at this time  - monitor for now  4,5. Hypertensive retinopathy OU  - discussed importance of tight BP control  - monitor  6. Combined form cataract OU   - The symptoms of cataract, surgical options, and treatments and risks were discussed with patient.  - discussed diagnosis and progression  - OS > OD, likely sequela from PPV OS  - approaching visual significance  - monitor for now  7. Dry eyes OU - improving - recommend artificial tears and lubricating ointment as needed    Ophthalmic Meds Ordered this visit:  No orders of the defined types were placed  in this encounter.      Return in about 4 weeks (around 12/17/2018) for f/u PDR OU, DFE, OCT.  There are no Patient Instructions on file for this visit.   Explained the diagnoses, plan, and follow up with the patient and they expressed understanding.  Patient expressed understanding of the importance of proper follow up care.   This document serves as a record of services personally performed by Gardiner Sleeper, MD, PhD. It was created on their behalf by Ernest Mallick, OA, an ophthalmic assistant. The creation of this record is the provider's dictation and/or activities during  the visit.    Electronically signed by: Ernest Mallick, OA  06.04.2020 10:05 AM     Gardiner Sleeper, M.D., Ph.D. Diseases & Surgery of the Retina and Vitreous [facility]  I have reviewed the above documentation for accuracy and completeness, and I agree with the above. Gardiner Sleeper, M.D., Ph.D. 11/19/18 10:10 AM     Abbreviations: M myopia (nearsighted); A astigmatism; H hyperopia (farsighted); P presbyopia; Mrx spectacle prescription;  CTL contact lenses; OD right eye; OS left eye; OU both eyes  XT exotropia; ET esotropia; PEK punctate epithelial keratitis; PEE punctate epithelial erosions; DES dry eye syndrome; MGD meibomian gland dysfunction; ATs artificial tears; PFAT's preservative free artificial tears; [place] nuclear sclerotic cataract; PSC posterior subcapsular cataract; ERM epi-retinal membrane; PVD posterior vitreous detachment; RD retinal detachment; DM diabetes mellitus; DR diabetic retinopathy; NPDR non-proliferative diabetic retinopathy; PDR proliferative diabetic retinopathy; CSME clinically significant macular edema; DME diabetic macular edema; dbh dot blot hemorrhages; CWS cotton wool spot; POAG primary open angle glaucoma; C/D cup-to-disc ratio; HVF humphrey visual field; GVF goldmann visual field; OCT optical coherence tomography; IOP intraocular pressure; BRVO Branch retinal vein occlusion; CRVO central retinal vein occlusion; CRAO central retinal artery occlusion; BRAO branch retinal artery occlusion; RT retinal tear; SB scleral buckle; PPV pars plana vitrectomy; VH Vitreous hemorrhage; PRP panretinal laser photocoagulation; IVK intravitreal kenalog; VMT vitreomacular traction; MH Macular hole;  NVD neovascularization of the disc; NVE neovascularization elsewhere; AREDS age related eye disease study; ARMD age related macular degeneration; POAG primary open angle glaucoma; EBMD epithelial/anterior basement membrane dystrophy; ACIOL anterior chamber  intraocular lens; IOL intraocular lens; PCIOL posterior chamber intraocular lens; Phaco/IOL phacoemulsification with intraocular lens placement; [place] photorefractive keratectomy; LASIK laser assisted in situ keratomileusis; HTN hypertension; DM diabetes mellitus; COPD chronic obstructive pulmonary disease

## 2018-11-19 ENCOUNTER — Encounter (INDEPENDENT_AMBULATORY_CARE_PROVIDER_SITE_OTHER): Payer: Self-pay | Admitting: Ophthalmology

## 2018-11-19 ENCOUNTER — Other Ambulatory Visit: Payer: Self-pay

## 2018-11-19 ENCOUNTER — Ambulatory Visit (INDEPENDENT_AMBULATORY_CARE_PROVIDER_SITE_OTHER): Payer: Medicare (Managed Care) | Admitting: Ophthalmology

## 2018-11-19 DIAGNOSIS — H25813 Combined forms of age-related cataract, bilateral: Secondary | ICD-10-CM

## 2018-11-19 DIAGNOSIS — H35373 Puckering of macula, bilateral: Secondary | ICD-10-CM

## 2018-11-19 DIAGNOSIS — H35033 Hypertensive retinopathy, bilateral: Secondary | ICD-10-CM

## 2018-11-19 DIAGNOSIS — H3581 Retinal edema: Secondary | ICD-10-CM | POA: Diagnosis not present

## 2018-11-19 DIAGNOSIS — E113513 Type 2 diabetes mellitus with proliferative diabetic retinopathy with macular edema, bilateral: Secondary | ICD-10-CM | POA: Diagnosis not present

## 2018-11-19 DIAGNOSIS — I10 Essential (primary) hypertension: Secondary | ICD-10-CM | POA: Diagnosis not present

## 2018-11-19 DIAGNOSIS — H04123 Dry eye syndrome of bilateral lacrimal glands: Secondary | ICD-10-CM

## 2018-11-19 MED ORDER — BEVACIZUMAB CHEMO INJECTION 1.25MG/0.05ML SYRINGE FOR KALEIDOSCOPE
1.2500 mg | INTRAVITREAL | Status: AC | PRN
  Administered 2018-11-19: 1.25 mg via INTRAVITREAL

## 2018-12-16 NOTE — Progress Notes (Signed)
[facility] Note  12/17/2018     CHIEF COMPLAINT Patient presents for Retina Evaluation   HISTORY OF PRESENT ILLNESS: John Jacobson is a 63 y.o. male who presents to the clinic today for:   HPI    Retina Evaluation    In both eyes.  This started weeks ago.  Duration of weeks.  Context:  distance vision.  I, the attending physician,  performed the HPI with the patient and updated documentation appropriately.          Comments    BS yesterday: 72 Last HgA1c: 7.4--patient states he is due for his blood work up.  Patient states his vision is improving.  Patient denies eye pain or discomfort.  Patient denies any new or worsening floaters or fol.       Last edited by Bernarda Caffey, MD on 12/17/2018  9:15 AM. (History)    pt states he can tell that his vision is improving, pt states he has been having low blood sugar in the morning, he states it was 72 yesterday, and a couple weeks ago it was in the 60s  Referring physician: Angelica Pou, MD [place],  [place] [address]  HISTORICAL INFORMATION:   Selected notes from the MEDICAL RECORD NUMBER Referred by Dr. Dorian Pod [facility]) for DM exam LEE:  Ocular Hx- PMH-    CURRENT MEDICATIONS: Current Outpatient Medications (Ophthalmic Drugs)  Medication Sig  . carboxymethylcellulose 1 % ophthalmic solution Place 1 drop into both eyes 4 (four) times daily.   No current facility-administered medications for this visit.  (Ophthalmic Drugs)   Current Outpatient Medications (Other)  Medication Sig  . acetaminophen (TYLENOL) 500 MG tablet Take 500 mg by mouth 2 (two) times daily as needed (for pain or headaches).  Marland Kitchen amoxicillin-clavulanate (AUGMENTIN) 875-125 MG tablet Take 1 tablet by mouth every 12 (twelve) hours.  Marland Kitchen aspirin EC 325 MG EC tablet Take 1 tablet (325 mg total) by mouth daily.  Marland Kitchen atorvastatin (LIPITOR) 20 MG tablet Take 20 mg by mouth daily.  . carvedilol  (COREG) 25 MG tablet Take 12.5 mg by mouth every 12 (twelve) hours.  . Cholecalciferol (VITAMIN D3) 2000 units TABS Take 4,000 Units by mouth daily.  . cloNIDine (CATAPRES) 0.3 MG tablet Take 1 tablet (0.3 mg total) by mouth 3 (three) times daily.  . clopidogrel (PLAVIX) 75 MG tablet Take 75 mg by mouth daily.  . Cyanocobalamin (VITAMIN B-12 PO) Take 1 tablet by mouth daily.  Marland Kitchen doxazosin (CARDURA) 8 MG tablet Take 4 mg by mouth 2 (two) times daily.  . ferrous sulfate 325 (65 FE) MG tablet Take 325 mg by mouth every Monday, Wednesday, and Friday.  . furosemide (LASIX) 40 MG tablet Take 0.5 tablets (20 mg total) by mouth daily.  Marland Kitchen glucose 4 GM chewable tablet Chew 1 tablet by mouth daily as needed for low blood sugar.  . hydrALAZINE (APRESOLINE) 100 MG tablet Take 1 tablet (100 mg total) by mouth 3 (three) times daily.  Marland Kitchen HYDROcodone-acetaminophen (NORCO/VICODIN) 5-325 MG tablet Take 1 tablet by mouth every 6 (six) hours as needed.  . insulin glargine (LANTUS) 100 unit/mL SOPN Inject 0.3 mLs (30 Units total) into the skin at bedtime.  . isosorbide mononitrate (IMDUR) 60 MG 24 hr tablet Take 1 tablet (60 mg total) by mouth daily.  . montelukast (SINGULAIR) 10 MG tablet Take 10 mg by mouth at bedtime.  . polyethylene glycol (MIRALAX /  GLYCOLAX) packet Take 17 g by mouth daily. (Patient taking differently: Take 17 g by mouth daily as needed for mild constipation. )  . potassium chloride (K-DUR) 10 MEQ tablet Take 1 tablet (10 mEq total) by mouth daily.  Marland Kitchen senna (SENOKOT) 8.6 MG TABS tablet Take 1 tablet (8.6 mg total) by mouth daily. (Patient taking differently: Take 1 tablet by mouth daily as needed for mild constipation. )  . Skin Protectants, Misc. (EUCERIN) cream Apply 1 application topically See admin instructions. Apply to both feet daily after showering   Current Facility-Administered Medications (Other)  Medication Route  . Bevacizumab (AVASTIN) SOLN 1.25 mg Intravitreal  . Bevacizumab  (AVASTIN) SOLN 1.25 mg Intravitreal  . Bevacizumab (AVASTIN) SOLN 1.25 mg Intravitreal  . Bevacizumab (AVASTIN) SOLN 1.25 mg Intravitreal      REVIEW OF SYSTEMS: ROS    Positive for: Endocrine, Eyes   Negative for: Constitutional, Gastrointestinal, Neurological, Skin, Genitourinary, Musculoskeletal, HENT, Cardiovascular, Respiratory, Psychiatric, Allergic/Imm, Heme/Lymph   Last edited by Doneen Poisson on 12/17/2018  8:49 AM. (History)       ALLERGIES Allergies  Allergen Reactions  . Amlodipine Besy-Benazepril Hcl Anaphylaxis, Shortness Of Breath and Swelling    Mouth and tongue swelling  . Shellfish Allergy Anaphylaxis, Shortness Of Breath and Swelling    PAST MEDICAL HISTORY Past Medical History:  Diagnosis Date  . Asthma   . Back pain   . CHF (congestive heart failure) ([place])   . Chronic kidney disease   . DM (diabetes mellitus) ([place])   . Hepatitis C   . HTN (hypertension)   . RBBB (right bundle branch block)   . Stroke [facility]) 2018   Past Surgical History:  Procedure Laterality Date  . BACK SURGERY  1995  . PARS PLANA VITRECTOMY Left 01/25/2017  . TOE AMPUTATION  2016    FAMILY HISTORY Family History  Problem Relation Age of Onset  . Coronary artery disease Mother        MI in her 23s  . Diabetes Mother   . Macular degeneration Mother   . Hypertension Other   . Diabetes Other   . Alzheimer's disease Other   . Coronary artery disease Brother   . Diabetes Brother   . Diabetes Sister     SOCIAL HISTORY Social History   Tobacco Use  . Smoking status: Former Smoker    Quit date: 01/07/2018    Years since quitting: 0.9  . Smokeless tobacco: Never Used  Substance Use Topics  . Alcohol use: No    Comment: Few beers every other day hx  . Drug use: No         OPHTHALMIC EXAM:  Base Eye Exam    Visual Acuity (Snellen - Linear)      Right Left   Dist [place] 20/50 20/80   Dist ph [place] 20/40 +1 20/40       Tonometry (Tonopen, 8:55 AM)      Right Left    Pressure 16 18       Pupils      Dark Light Shape React APD   Right 3 2 Round Brisk 0   Left 3 2 Round Brisk 0       Extraocular Movement      Right Left    Full Full       Neuro/Psych    Oriented x3: Yes   Mood/Affect: Normal       Dilation    Both eyes: 1.0% Mydriacyl, 2.5% Phenylephrine @  8:55 AM        Slit Lamp and Fundus Exam    Slit Lamp Exam      Right Left   Lids/Lashes Dermatochalasis - upper lid, mild Meibomian gland dysfunction Dermatochalasis - upper lid, mild Meibomian gland dysfunction   Conjunctiva/Sclera nasal Pinguecula, Melanosis nasal/temporal Pinguecula, Melanosis   Cornea trace-1+ Punctate epithelial erosions Mild Debris in tear film, 1+ Punctate epithelial erosions nasal and inferior   Anterior Chamber deep, narrow temporal angle deep, narrow temporal angle   Iris Round and dilated, No NVI Round and dilated, No NVI   Lens 2+ Nuclear sclerosis, 2+ Cortical cataract 2-3+ Nuclear sclerosis, 2-3+ Cortical cataract, 1+ Posterior subcapsular cataract   Vitreous Vitreous syneresis post vitrectomy       Fundus Exam      Right Left   Disc mild pallor, sharp rim mild pallor, sharp rim   C/D Ratio 0.4 0.5   Macula Blunted foveal reflex, scattered MA, +Epiretinal membrane, +edema - mild improvement, scattered exudates - slightly improved good foveal reflex, scattered Microaneurysms, scattered IRH - cluster just temporal to fovea, temporal macula very ischemic   Vessels severe Vascular attenuation, sclerotic arterioles, +AV crossing changes Vascular attenuation, mild Tortuousity   Periphery Attached, 360 PRP, good posterior laser fill in, scattered IRH Attached, good 360 PRP in place          IMAGING AND PROCEDURES  Imaging and Procedures for @TODAY @  OCT, Retina - OU - Both Eyes       Right Eye Quality was good. Central Foveal Thickness: 271. Progression has improved. Findings include abnormal foveal contour, epiretinal membrane, no SRF, outer  retinal atrophy, intraretinal fluid, macular pucker, intraretinal hyper-reflective material (Interval improvement in IRF).   Left Eye Quality was good. Central Foveal Thickness: 279. Progression has improved. Findings include abnormal foveal contour, intraretinal fluid, no SRF, epiretinal membrane, outer retinal atrophy, inner retinal atrophy (Interval improvement in IRF).   Notes *Images captured and stored on drive  Diagnosis / Impression:  OU: ERM, diffuse atrophy, +DME OD: interval improvement in IRF ST macula OS: Interval improvement in IRF  Clinical management:  See below  Abbreviations: NFP - Normal foveal profile. CME - cystoid macular edema. PED - pigment epithelial detachment. IRF - intraretinal fluid. SRF - subretinal fluid. EZ - ellipsoid zone. ERM - epiretinal membrane. ORA - outer retinal atrophy. ORT - outer retinal tubulation. SRHM - subretinal hyper-reflective material        Intravitreal Injection, Pharmacologic Agent - OD - Right Eye       Time Out 12/17/2018. 9:21 AM. Confirmed correct patient, procedure, site, and patient consented.   Anesthesia Topical anesthesia was used. Anesthetic medications included Lidocaine 2%, Proparacaine 0.5%.   Procedure Preparation included 5% betadine to ocular surface, eyelid speculum. A supplied needle was used.   Injection:  1.25 mg Bevacizumab (AVASTIN) SOLN   NDC: 84132-440-10, Lot: [id number] , Expiration date: 02/20/2019   Route: Intravitreal, Site: Right Eye, Waste: 0 mL  Post-op Post injection exam found visual acuity of at least counting fingers. The patient tolerated the procedure well. There were no complications. The patient received written and verbal post procedure care education.        Intravitreal Injection, Pharmacologic Agent - OS - Left Eye       Time Out 12/17/2018. 9:21 AM. Confirmed correct patient, procedure, site, and patient consented.   Anesthesia Topical anesthesia was used. Anesthetic  medications included Lidocaine 2%, Proparacaine 0.5%.   Procedure Preparation included 5% betadine to  ocular surface, eyelid speculum. A supplied needle was used.   Injection:  1.25 mg Bevacizumab (AVASTIN) SOLN   NDC: 77939-030-09, Lot: [id number] , Expiration date: 02/20/2019   Route: Intravitreal, Site: Left Eye, Waste: 0 mL  Post-op Post injection exam found visual acuity of at least counting fingers. The patient tolerated the procedure well. There were no complications. The patient received written and verbal post procedure care education.                 ASSESSMENT/PLAN:    ICD-10-CM   1. Proliferative diabetic retinopathy of both eyes with macular edema associated with type 2 diabetes mellitus (HCC)  22/02/2019 Intravitreal Injection, Pharmacologic Agent - OD - Right Eye    Intravitreal Injection, Pharmacologic Agent - OS - Left Eye    Bevacizumab (AVASTIN) SOLN 1.25 mg    Bevacizumab (AVASTIN) SOLN 1.25 mg  2. Retinal edema  H35.81 OCT, Retina - OU - Both Eyes  3. Epiretinal membrane (ERM) of both eyes  H35.373   4. Essential hypertension  I10   5. Hypertensive retinopathy of both eyes  H35.033   6. Combined forms of age-related cataract of both eyes  H25.813   7. Dry eyes  H04.123     1,2. Proliferative diabetic retinopathy w/ DME, OU  - former pt of [phone] at [facility] and [facility] at [place] -- known history of proliferative diabetic retinopathy  - history PPV OS and laser PRP OD with Dr. Seiling for PDR OU w/ TRD OS -- 01/25/17  - history of intravitreal anti-VEGF therapy with Dr. 02/07/17 -- last IVE ~01/2018  - s/p IVA OU #1 (02.13.20), #2 (03.12.20), #3 (05.10.20), #4 (06.08.20)  - s/p PRP fill in OD (02.27.20) -- good fill in laser in place  - exam shows regression of fine NVD OD, PRP laser in place OU  - FA (2.13.2020) shows active NVE and NVD OD; significant vascular perfusion defects OS  - OCT shows interval improvement in focal diabetic macular  edema, both eyes   - BCVA improved to 20/40 OU  - discussed findings and prognosis  - recommend IVA OU #5 today, 07.06.20  - pt wishes to proceed  - RBA of procedure discussed, questions answered  - informed consent obtained and signed  - see procedure note  - f/u 4 weeks, DFE/OCT/possible injections  3. Epiretinal membrane, both eyes   - mild ERM  - asymptomatic, no metamorphopsia  - no indication for surgery at this time  - monitor for now  4,5. Hypertensive retinopathy OU  - discussed importance of tight BP control  - monitor  6. Combined form cataract OU   - The symptoms of cataract, surgical options, and treatments and risks were discussed with patient.  - discussed diagnosis and progression  - OS > OD, likely sequela from PPV OS  - approaching visual significance  - monitor for now  7. Dry eyes OU  - improving  - recommend artificial tears and lubricating ointment as needed    Ophthalmic Meds Ordered this visit:  Meds ordered this encounter  Medications  . Bevacizumab (AVASTIN) SOLN 1.25 mg  . Bevacizumab (AVASTIN) SOLN 1.25 mg       Return in about 4 weeks (around 01/14/2019) for f/u PDR OU, DFE, OCT.  There are no Patient Instructions on file for this visit.   Explained the diagnoses, plan, and follow up with the patient and they expressed understanding.  Patient expressed understanding of the importance of proper  follow up care.   This document serves as a record of services personally performed by Gardiner Sleeper, MD, PhD. It was created on their behalf by Ernest Mallick, OA, an ophthalmic assistant. The creation of this record is the provider's dictation and/or activities during the visit.    Electronically signed by: Ernest Mallick, OA  07.05.2020 9:38 AM    Gardiner Sleeper, M.D., Ph.D. Diseases & Surgery of the Retina and Vitreous [facility]  I have reviewed the above documentation for accuracy and completeness, and I agree with  the above. Gardiner Sleeper, M.D., Ph.D. 12/17/18 9:39 AM   Abbreviations: M myopia (nearsighted); A astigmatism; H hyperopia (farsighted); P presbyopia; Mrx spectacle prescription;  CTL contact lenses; OD right eye; OS left eye; OU both eyes  XT exotropia; ET esotropia; PEK punctate epithelial keratitis; PEE punctate epithelial erosions; DES dry eye syndrome; MGD meibomian gland dysfunction; ATs artificial tears; PFAT's preservative free artificial tears; [place] nuclear sclerotic cataract; PSC posterior subcapsular cataract; ERM epi-retinal membrane; PVD posterior vitreous detachment; RD retinal detachment; DM diabetes mellitus; DR diabetic retinopathy; NPDR non-proliferative diabetic retinopathy; PDR proliferative diabetic retinopathy; CSME clinically significant macular edema; DME diabetic macular edema; dbh dot blot hemorrhages; CWS cotton wool spot; POAG primary open angle glaucoma; C/D cup-to-disc ratio; HVF humphrey visual field; GVF goldmann visual field; OCT optical coherence tomography; IOP intraocular pressure; BRVO Branch retinal vein occlusion; CRVO central retinal vein occlusion; CRAO central retinal artery occlusion; BRAO branch retinal artery occlusion; RT retinal tear; SB scleral buckle; PPV pars plana vitrectomy; VH Vitreous hemorrhage; PRP panretinal laser photocoagulation; IVK intravitreal kenalog; VMT vitreomacular traction; MH Macular hole;  NVD neovascularization of the disc; NVE neovascularization elsewhere; AREDS age related eye disease study; ARMD age related macular degeneration; POAG primary open angle glaucoma; EBMD epithelial/anterior basement membrane dystrophy; ACIOL anterior chamber intraocular lens; IOL intraocular lens; PCIOL posterior chamber intraocular lens; Phaco/IOL phacoemulsification with intraocular lens placement; [place] photorefractive keratectomy; LASIK laser assisted in situ keratomileusis; HTN hypertension; DM diabetes mellitus; COPD chronic obstructive pulmonary  disease

## 2018-12-17 ENCOUNTER — Ambulatory Visit (INDEPENDENT_AMBULATORY_CARE_PROVIDER_SITE_OTHER): Payer: Medicare (Managed Care) | Admitting: Ophthalmology

## 2018-12-17 ENCOUNTER — Other Ambulatory Visit: Payer: Self-pay

## 2018-12-17 ENCOUNTER — Encounter (INDEPENDENT_AMBULATORY_CARE_PROVIDER_SITE_OTHER): Payer: Self-pay | Admitting: Ophthalmology

## 2018-12-17 DIAGNOSIS — I10 Essential (primary) hypertension: Secondary | ICD-10-CM | POA: Diagnosis not present

## 2018-12-17 DIAGNOSIS — E113513 Type 2 diabetes mellitus with proliferative diabetic retinopathy with macular edema, bilateral: Secondary | ICD-10-CM

## 2018-12-17 DIAGNOSIS — H35033 Hypertensive retinopathy, bilateral: Secondary | ICD-10-CM

## 2018-12-17 DIAGNOSIS — H3581 Retinal edema: Secondary | ICD-10-CM | POA: Diagnosis not present

## 2018-12-17 DIAGNOSIS — H35373 Puckering of macula, bilateral: Secondary | ICD-10-CM | POA: Diagnosis not present

## 2018-12-17 DIAGNOSIS — H04123 Dry eye syndrome of bilateral lacrimal glands: Secondary | ICD-10-CM

## 2018-12-17 DIAGNOSIS — H25813 Combined forms of age-related cataract, bilateral: Secondary | ICD-10-CM

## 2018-12-17 MED ORDER — BEVACIZUMAB CHEMO INJECTION 1.25MG/0.05ML SYRINGE FOR KALEIDOSCOPE
1.2500 mg | INTRAVITREAL | Status: AC | PRN
  Administered 2018-12-17: 10:00:00 1.25 mg via INTRAVITREAL

## 2018-12-17 MED ORDER — BEVACIZUMAB CHEMO INJECTION 1.25MG/0.05ML SYRINGE FOR KALEIDOSCOPE
1.2500 mg | INTRAVITREAL | Status: AC | PRN
  Administered 2018-12-17: 1.25 mg via INTRAVITREAL

## 2019-01-13 NOTE — Progress Notes (Signed)
[facility] Note  01/14/2019     CHIEF COMPLAINT Patient presents for Retina Follow Up   HISTORY OF PRESENT ILLNESS: John Jacobson is a 63 y.o. male who presents to the clinic today for:   HPI    Retina Follow Up    Patient presents with  Diabetic Retinopathy.  In both eyes.  This started 4 weeks ago.  Severity is moderate.  I, the attending physician,  performed the HPI with the patient and updated documentation appropriately.          Comments    Patient here for 4 weeks retina  follow up for PDR OU. Patient states vision kind of cloudy today not sharp. OS has a little eye pain but feels like a hair or something.        Last edited by Bernarda Caffey, MD on 01/14/2019  8:45 AM. (History)    pt states his vision is cloudy this morning, he states some days are better than others  Referring physician: Angelica Pou, MD [place],  [place] [address]  HISTORICAL INFORMATION:   Selected notes from the MEDICAL RECORD NUMBER Referred by Dr. Dorian Pod [facility]) for DM exam LEE:  Ocular Hx- PMH-    CURRENT MEDICATIONS: Current Outpatient Medications (Ophthalmic Drugs)  Medication Sig  . carboxymethylcellulose 1 % ophthalmic solution Place 1 drop into both eyes 4 (four) times daily.   No current facility-administered medications for this visit.  (Ophthalmic Drugs)   Current Outpatient Medications (Other)  Medication Sig  . acetaminophen (TYLENOL) 500 MG tablet Take 500 mg by mouth 2 (two) times daily as needed (for pain or headaches).  Marland Kitchen amoxicillin-clavulanate (AUGMENTIN) 875-125 MG tablet Take 1 tablet by mouth every 12 (twelve) hours.  Marland Kitchen aspirin EC 325 MG EC tablet Take 1 tablet (325 mg total) by mouth daily.  Marland Kitchen atorvastatin (LIPITOR) 20 MG tablet Take 20 mg by mouth daily.  . carvedilol (COREG) 25 MG tablet Take 12.5 mg by mouth every 12 (twelve) hours.  . Cholecalciferol (VITAMIN D3) 2000 units TABS Take 4,000  Units by mouth daily.  . cloNIDine (CATAPRES) 0.3 MG tablet Take 1 tablet (0.3 mg total) by mouth 3 (three) times daily.  . clopidogrel (PLAVIX) 75 MG tablet Take 75 mg by mouth daily.  . Cyanocobalamin (VITAMIN B-12 PO) Take 1 tablet by mouth daily.  Marland Kitchen doxazosin (CARDURA) 8 MG tablet Take 4 mg by mouth 2 (two) times daily.  . ferrous sulfate 325 (65 FE) MG tablet Take 325 mg by mouth every Monday, Wednesday, and Friday.  . furosemide (LASIX) 40 MG tablet Take 0.5 tablets (20 mg total) by mouth daily.  Marland Kitchen glucose 4 GM chewable tablet Chew 1 tablet by mouth daily as needed for low blood sugar.  . hydrALAZINE (APRESOLINE) 100 MG tablet Take 1 tablet (100 mg total) by mouth 3 (three) times daily.  Marland Kitchen HYDROcodone-acetaminophen (NORCO/VICODIN) 5-325 MG tablet Take 1 tablet by mouth every 6 (six) hours as needed.  . insulin glargine (LANTUS) 100 unit/mL SOPN Inject 0.3 mLs (30 Units total) into the skin at bedtime.  . isosorbide mononitrate (IMDUR) 60 MG 24 hr tablet Take 1 tablet (60 mg total) by mouth daily.  . montelukast (SINGULAIR) 10 MG tablet Take 10 mg by mouth at bedtime.  . polyethylene glycol (MIRALAX / GLYCOLAX) packet Take 17 g by mouth daily. (Patient taking differently: Take 17 g by mouth daily as needed for  mild constipation. )  . potassium chloride (K-DUR) 10 MEQ tablet Take 1 tablet (10 mEq total) by mouth daily.  Marland Kitchen senna (SENOKOT) 8.6 MG TABS tablet Take 1 tablet (8.6 mg total) by mouth daily. (Patient taking differently: Take 1 tablet by mouth daily as needed for mild constipation. )  . Skin Protectants, Misc. (EUCERIN) cream Apply 1 application topically See admin instructions. Apply to both feet daily after showering   Current Facility-Administered Medications (Other)  Medication Route  . Bevacizumab (AVASTIN) SOLN 1.25 mg Intravitreal  . Bevacizumab (AVASTIN) SOLN 1.25 mg Intravitreal  . Bevacizumab (AVASTIN) SOLN 1.25 mg Intravitreal  . Bevacizumab (AVASTIN) SOLN 1.25 mg  Intravitreal      REVIEW OF SYSTEMS: ROS    Positive for: Endocrine, Cardiovascular, Eyes   Negative for: Constitutional, Gastrointestinal, Neurological, Skin, Genitourinary, Musculoskeletal, HENT, Respiratory, Psychiatric, Allergic/Imm, Heme/Lymph   Last edited by Theodore Demark on 01/14/2019  8:39 AM. (History)       ALLERGIES Allergies  Allergen Reactions  . Amlodipine Besy-Benazepril Hcl Anaphylaxis, Shortness Of Breath and Swelling    Mouth and tongue swelling  . Shellfish Allergy Anaphylaxis, Shortness Of Breath and Swelling    PAST MEDICAL HISTORY Past Medical History:  Diagnosis Date  . Asthma   . Back pain   . CHF (congestive heart failure) ([place])   . Chronic kidney disease   . DM (diabetes mellitus) ([place])   . Hepatitis C   . HTN (hypertension)   . RBBB (right bundle branch block)   . Stroke [facility]) 2018   Past Surgical History:  Procedure Laterality Date  . BACK SURGERY  1995  . PARS PLANA VITRECTOMY Left 01/25/2017  . TOE AMPUTATION  2016    FAMILY HISTORY Family History  Problem Relation Age of Onset  . Coronary artery disease Mother        MI in her 20s  . Diabetes Mother   . Macular degeneration Mother   . Hypertension Other   . Diabetes Other   . Alzheimer's disease Other   . Coronary artery disease Brother   . Diabetes Brother   . Diabetes Sister     SOCIAL HISTORY Social History   Tobacco Use  . Smoking status: Former Smoker    Quit date: 01/07/2018    Years since quitting: 1.0  . Smokeless tobacco: Never Used  Substance Use Topics  . Alcohol use: No    Comment: Few beers every other day hx  . Drug use: No         OPHTHALMIC EXAM:  Base Eye Exam    Visual Acuity (Snellen - Linear)      Right Left   Dist [place] 20/70 +1 20/70   Dist ph [place] 20/40 -1 20/50       Tonometry (Tonopen, 8:34 AM)      Right Left   Pressure 13 14       Pupils      Dark Light Shape React APD   Right 3 2 Round Brisk None   Left 3 2 Round Brisk  None       Visual Fields (Counting fingers)      Left Right    Full Full       Extraocular Movement      Right Left    Full, Ortho Full, Ortho       Neuro/Psych    Oriented x3: Yes   Mood/Affect: Normal       Dilation    Both eyes:  1.0% Mydriacyl, 2.5% Phenylephrine @ 8:34 AM        Slit Lamp and Fundus Exam    Slit Lamp Exam      Right Left   Lids/Lashes Dermatochalasis - upper lid, mild Meibomian gland dysfunction Dermatochalasis - upper lid, mild Meibomian gland dysfunction   Conjunctiva/Sclera nasal Pinguecula, Melanosis nasal/temporal Pinguecula, Melanosis   Cornea trace-1+ Punctate epithelial erosions Mild Debris in tear film, 1+ Punctate epithelial erosions nasal and inferior   Anterior Chamber deep, narrow temporal angle deep, narrow temporal angle   Iris Round and dilated, No NVI Round and dilated, No NVI   Lens 2+ Nuclear sclerosis, 2+ Cortical cataract 2-3+ Nuclear sclerosis, 2-3+ Cortical cataract, 1+ Posterior subcapsular cataract   Vitreous Vitreous syneresis post vitrectomy       Fundus Exam      Right Left   Disc mild pallor, sharp rim mild pallor, sharp rim   C/D Ratio 0.4 0.5   Macula Blunted foveal reflex, scattered MA, +Epiretinal membrane, +edema - mild improvement, scattered exudates - slightly improved good foveal reflex, scattered Microaneurysms, scattered IRH - cluster just temporal to fovea, temporal macula very ischemic   Vessels severe Vascular attenuation, sclerotic arterioles, +AV crossing changes Vascular attenuation, mild Tortuousity   Periphery Attached, 360 PRP, good posterior laser fill in, scattered IRH Attached, good 360 PRP in place          IMAGING AND PROCEDURES  Imaging and Procedures for @TODAY @  OCT, Retina - OU - Both Eyes       Right Eye Quality was good. Central Foveal Thickness: 254. Progression has improved. Findings include abnormal foveal contour, epiretinal membrane, no SRF, outer retinal atrophy, intraretinal  fluid, macular pucker, intraretinal hyper-reflective material (Mild Interval improvement in IRF).   Left Eye Quality was good. Central Foveal Thickness: 271. Progression has improved. Findings include abnormal foveal contour, intraretinal fluid, no SRF, epiretinal membrane, outer retinal atrophy, inner retinal atrophy (Mild Interval improvement in IRF).   Notes *Images captured and stored on drive  Diagnosis / Impression:  OU: ERM, diffuse atrophy, +DME OD: interval improvement in IRF ST macula OS: Interval improvement in IRF  Clinical management:  See below  Abbreviations: NFP - Normal foveal profile. CME - cystoid macular edema. PED - pigment epithelial detachment. IRF - intraretinal fluid. SRF - subretinal fluid. EZ - ellipsoid zone. ERM - epiretinal membrane. ORA - outer retinal atrophy. ORT - outer retinal tubulation. SRHM - subretinal hyper-reflective material        Intravitreal Injection, Pharmacologic Agent - OD - Right Eye       Time Out 01/14/2019. 9:00 AM. Confirmed correct patient, procedure, site, and patient consented.   Anesthesia Topical anesthesia was used. Anesthetic medications included Lidocaine 2%, Proparacaine 0.5%.   Procedure Preparation included 5% betadine to ocular surface, eyelid speculum. A supplied needle was used.   Injection:  1.25 mg Bevacizumab (AVASTIN) SOLN   NDC: 32202-542-70, Lot: [id number] , Expiration date: 03/14/2019   Route: Intravitreal, Site: Right Eye, Waste: 0 mL  Post-op Post injection exam found visual acuity of at least counting fingers. The patient tolerated the procedure well. There were no complications. The patient received written and verbal post procedure care education.        Intravitreal Injection, Pharmacologic Agent - OS - Left Eye       Time Out 01/14/2019. 9:00 AM. Confirmed correct patient, procedure, site, and patient consented.   Anesthesia Topical anesthesia was used. Anesthetic medications included  Lidocaine 2%, Proparacaine 0.5%.  Procedure Preparation included 5% betadine to ocular surface, eyelid speculum. A 30 gauge needle was used.   Injection:  1.25 mg Bevacizumab (AVASTIN) SOLN   NDC: 81448-185-63, Lot: [id number], Expiration date: 02/20/2019   Route: Intravitreal, Site: Left Eye, Waste: 0 mL  Post-op Post injection exam found visual acuity of at least counting fingers. The patient tolerated the procedure well. There were no complications. The patient received written and verbal post procedure care education.                 ASSESSMENT/PLAN:    ICD-10-CM   1. Proliferative diabetic retinopathy of both eyes with macular edema associated with type 2 diabetes mellitus (HCC)  Y85.0277 Intravitreal Injection, Pharmacologic Agent - OD - Right Eye    Intravitreal Injection, Pharmacologic Agent - OS - Left Eye    Bevacizumab (AVASTIN) SOLN 1.25 mg    Bevacizumab (AVASTIN) SOLN 1.25 mg  2. Retinal edema  H35.81 OCT, Retina - OU - Both Eyes  3. Epiretinal membrane (ERM) of both eyes  H35.373   4. Essential hypertension  I10   5. Hypertensive retinopathy of both eyes  H35.033   6. Combined forms of age-related cataract of both eyes  H25.813   7. Dry eyes  H04.123     1,2. Proliferative diabetic retinopathy w/ DME, OU  - former pt of Dwana Melena at [facility] and Adonis Brook at [facility] -- known history of proliferative diabetic retinopathy  - history PPV OS and laser PRP OD with Dr. Manuella Ghazi for PDR OU w/ TRD OS -- 01/25/17  - history of intravitreal anti-VEGF therapy with Dr. Anderson Malta -- last IVE ~01/2018  - s/p IVA OU #1 (02.13.20), #2 (03.12.20), #3 (05.10.20), #4 (06.08.20), #5 (07.06.20)  - s/p PRP fill in OD (02.27.20) -- good fill in laser in place  - exam shows regression of fine NVD OD, PRP laser in place OU  - FA (2.13.2020) shows active NVE and NVD OD; significant vascular perfusion defects OS  - OCT shows mild interval improvement in focal diabetic macular  edema, both eyes   - BCVA relatively stable at 20/40 OD, 20/50 OS  - discussed findings and prognosis  - recommend IVA OU #6 today, 08.03.20  - pt wishes to proceed  - RBA of procedure discussed, questions answered  - informed consent obtained and signed  - see procedure note  - f/u 4 weeks, DFE/OCT/repeat Optos FA (transit OD)/possible injections  3. Epiretinal membrane, both eyes   - mild ERM  - asymptomatic, no metamorphopsia  - no indication for surgery at this time  - monitor for now  4,5. Hypertensive retinopathy OU  - discussed importance of tight BP control  - monitor  6. Combined form cataract OU   - The symptoms of cataract, surgical options, and treatments and risks were discussed with patient.  - discussed diagnosis and progression  - OS > OD, likely sequela from PPV OS  - approaching visual significance  - monitor for now  7. Dry eyes OU  - improving  - recommend artificial tears and lubricating ointment as needed    Ophthalmic Meds Ordered this visit:  Meds ordered this encounter  Medications  . Bevacizumab (AVASTIN) SOLN 1.25 mg  . Bevacizumab (AVASTIN) SOLN 1.25 mg       Return in about 4 weeks (around 02/11/2019) for f/u PDR OU, Dilated Exam, OCT.  There are no Patient Instructions on file for this visit.   Explained the diagnoses, plan, and  follow up with the patient and they expressed understanding.  Patient expressed understanding of the importance of proper follow up care.   This document serves as a record of services personally performed by Gardiner Sleeper, MD, PhD. It was created on their behalf by Ernest Mallick, OA, an ophthalmic assistant. The creation of this record is the provider's dictation and/or activities during the visit.    Electronically signed by: Ernest Mallick, OA  08.02.2020 12:42 PM    Gardiner Sleeper, M.D., Ph.D. Diseases & Surgery of the Retina and Vitreous [facility]  I have reviewed the above  documentation for accuracy and completeness, and I agree with the above. Gardiner Sleeper, M.D., Ph.D. 01/14/19 12:42 PM    Abbreviations: M myopia (nearsighted); A astigmatism; H hyperopia (farsighted); P presbyopia; Mrx spectacle prescription;  CTL contact lenses; OD right eye; OS left eye; OU both eyes  XT exotropia; ET esotropia; PEK punctate epithelial keratitis; PEE punctate epithelial erosions; DES dry eye syndrome; MGD meibomian gland dysfunction; ATs artificial tears; PFAT's preservative free artificial tears; [place] nuclear sclerotic cataract; PSC posterior subcapsular cataract; ERM epi-retinal membrane; PVD posterior vitreous detachment; RD retinal detachment; DM diabetes mellitus; DR diabetic retinopathy; NPDR non-proliferative diabetic retinopathy; PDR proliferative diabetic retinopathy; CSME clinically significant macular edema; DME diabetic macular edema; dbh dot blot hemorrhages; CWS cotton wool spot; POAG primary open angle glaucoma; C/D cup-to-disc ratio; HVF humphrey visual field; GVF goldmann visual field; OCT optical coherence tomography; IOP intraocular pressure; BRVO Branch retinal vein occlusion; CRVO central retinal vein occlusion; CRAO central retinal artery occlusion; BRAO branch retinal artery occlusion; RT retinal tear; SB scleral buckle; PPV pars plana vitrectomy; VH Vitreous hemorrhage; PRP panretinal laser photocoagulation; IVK intravitreal kenalog; VMT vitreomacular traction; MH Macular hole;  NVD neovascularization of the disc; NVE neovascularization elsewhere; AREDS age related eye disease study; ARMD age related macular degeneration; POAG primary open angle glaucoma; EBMD epithelial/anterior basement membrane dystrophy; ACIOL anterior chamber intraocular lens; IOL intraocular lens; PCIOL posterior chamber intraocular lens; Phaco/IOL phacoemulsification with intraocular lens placement; [place] photorefractive keratectomy; LASIK laser assisted in situ keratomileusis; HTN hypertension;  DM diabetes mellitus; COPD chronic obstructive pulmonary disease

## 2019-01-14 ENCOUNTER — Encounter (INDEPENDENT_AMBULATORY_CARE_PROVIDER_SITE_OTHER): Payer: Self-pay | Admitting: Ophthalmology

## 2019-01-14 ENCOUNTER — Ambulatory Visit (INDEPENDENT_AMBULATORY_CARE_PROVIDER_SITE_OTHER): Payer: Medicare (Managed Care) | Admitting: Ophthalmology

## 2019-01-14 ENCOUNTER — Other Ambulatory Visit: Payer: Self-pay

## 2019-01-14 DIAGNOSIS — H25813 Combined forms of age-related cataract, bilateral: Secondary | ICD-10-CM

## 2019-01-14 DIAGNOSIS — H3581 Retinal edema: Secondary | ICD-10-CM

## 2019-01-14 DIAGNOSIS — E113513 Type 2 diabetes mellitus with proliferative diabetic retinopathy with macular edema, bilateral: Secondary | ICD-10-CM

## 2019-01-14 DIAGNOSIS — H35373 Puckering of macula, bilateral: Secondary | ICD-10-CM

## 2019-01-14 DIAGNOSIS — H04123 Dry eye syndrome of bilateral lacrimal glands: Secondary | ICD-10-CM

## 2019-01-14 DIAGNOSIS — I10 Essential (primary) hypertension: Secondary | ICD-10-CM | POA: Diagnosis not present

## 2019-01-14 DIAGNOSIS — H35033 Hypertensive retinopathy, bilateral: Secondary | ICD-10-CM

## 2019-01-14 MED ORDER — BEVACIZUMAB CHEMO INJECTION 1.25MG/0.05ML SYRINGE FOR KALEIDOSCOPE
1.2500 mg | INTRAVITREAL | Status: AC | PRN
  Administered 2019-01-14: 1.25 mg via INTRAVITREAL

## 2019-02-07 NOTE — Progress Notes (Signed)
[facility] Note  02/11/2019     CHIEF COMPLAINT Patient presents for Retina Follow Up   HISTORY OF PRESENT ILLNESS: John Jacobson is a 63 y.o. male who presents to the clinic today for:   HPI    Retina Follow Up    Patient presents with  Diabetic Retinopathy.  In both eyes.  Severity is moderate.  Duration of 4 weeks.  I, the attending physician,  performed the HPI with the patient and updated documentation appropriately.          Comments    Patient states vision the same OU. BS was 122 yesterday am. Last a1c unknown.       Last edited by Bernarda Caffey, MD on 02/11/2019  5:30 PM. (History)      Referring physician: Angelica Pou, [place],  [place] [address]  HISTORICAL INFORMATION:   Selected notes from the MEDICAL RECORD NUMBER Referred by Dr. Dorian Pod [facility]) for DM exam LEE:  Ocular Hx- PMH-    CURRENT MEDICATIONS: Current Outpatient Medications (Ophthalmic Drugs)  Medication Sig  . carboxymethylcellulose 1 % ophthalmic solution Place 1 drop into both eyes 4 (four) times daily.   No current facility-administered medications for this visit.  (Ophthalmic Drugs)   Current Outpatient Medications (Other)  Medication Sig  . acetaminophen (TYLENOL) 500 MG tablet Take 500 mg by mouth 2 (two) times daily as needed (for pain or headaches).  Marland Kitchen amoxicillin-clavulanate (AUGMENTIN) 875-125 MG tablet Take 1 tablet by mouth every 12 (twelve) hours.  Marland Kitchen aspirin EC 325 MG EC tablet Take 1 tablet (325 mg total) by mouth daily.  Marland Kitchen atorvastatin (LIPITOR) 20 MG tablet Take 20 mg by mouth daily.  . carvedilol (COREG) 25 MG tablet Take 12.5 mg by mouth every 12 (twelve) hours.  . Cholecalciferol (VITAMIN D3) 2000 units TABS Take 4,000 Units by mouth daily.  . cloNIDine (CATAPRES) 0.3 MG tablet Take 1 tablet (0.3 mg total) by mouth 3 (three) times daily.  . clopidogrel (PLAVIX) 75 MG tablet Take 75 mg by mouth  daily.  . Cyanocobalamin (VITAMIN B-12 PO) Take 1 tablet by mouth daily.  Marland Kitchen doxazosin (CARDURA) 8 MG tablet Take 4 mg by mouth 2 (two) times daily.  . ferrous sulfate 325 (65 FE) MG tablet Take 325 mg by mouth every Monday, Wednesday, and Friday.  . furosemide (LASIX) 40 MG tablet Take 0.5 tablets (20 mg total) by mouth daily.  Marland Kitchen glucose 4 GM chewable tablet Chew 1 tablet by mouth daily as needed for low blood sugar.  . hydrALAZINE (APRESOLINE) 100 MG tablet Take 1 tablet (100 mg total) by mouth 3 (three) times daily.  Marland Kitchen HYDROcodone-acetaminophen (NORCO/VICODIN) 5-325 MG tablet Take 1 tablet by mouth every 6 (six) hours as needed.  . insulin glargine (LANTUS) 100 unit/mL SOPN Inject 0.3 mLs (30 Units total) into the skin at bedtime.  . montelukast (SINGULAIR) 10 MG tablet Take 10 mg by mouth at bedtime.  . polyethylene glycol (MIRALAX / GLYCOLAX) packet Take 17 g by mouth daily. (Patient taking differently: Take 17 g by mouth daily as needed for mild constipation. )  . potassium chloride (K-DUR) 10 MEQ tablet Take 1 tablet (10 mEq total) by mouth daily.  Marland Kitchen senna (SENOKOT) 8.6 MG TABS tablet Take 1 tablet (8.6 mg total) by mouth daily. (Patient taking differently: Take 1 tablet by mouth daily as needed for mild constipation. )  . Skin Protectants,  Misc. (EUCERIN) cream Apply 1 application topically See admin instructions. Apply to both feet daily after showering  . isosorbide mononitrate (IMDUR) 60 MG 24 hr tablet Take 1 tablet (60 mg total) by mouth daily.   Current Facility-Administered Medications (Other)  Medication Route  . Bevacizumab (AVASTIN) SOLN 1.25 mg Intravitreal  . Bevacizumab (AVASTIN) SOLN 1.25 mg Intravitreal  . Bevacizumab (AVASTIN) SOLN 1.25 mg Intravitreal  . Bevacizumab (AVASTIN) SOLN 1.25 mg Intravitreal      REVIEW OF SYSTEMS: ROS    Positive for: Endocrine, Cardiovascular, Eyes   Negative for: Constitutional, Gastrointestinal, Neurological, Skin, Genitourinary,  Musculoskeletal, HENT, Respiratory, Psychiatric, Allergic/Imm, Heme/Lymph   Last edited by Roselee Nova D, COT on 02/11/2019  9:28 AM. (History)       ALLERGIES Allergies  Allergen Reactions  . Amlodipine Besy-Benazepril Hcl Anaphylaxis, Shortness Of Breath and Swelling    Mouth and tongue swelling  . Shellfish Allergy Anaphylaxis, Shortness Of Breath and Swelling    PAST MEDICAL HISTORY Past Medical History:  Diagnosis Date  . Asthma   . Back pain   . CHF (congestive heart failure) ([place])   . Chronic kidney disease   . DM (diabetes mellitus) ([place])   . Hepatitis C   . HTN (hypertension)   . RBBB (right bundle branch block)   . Stroke [facility]) 2018   Past Surgical History:  Procedure Laterality Date  . BACK SURGERY  1995  . PARS PLANA VITRECTOMY Left 01/25/2017  . TOE AMPUTATION  2016    FAMILY HISTORY Family History  Problem Relation Age of Onset  . Coronary artery disease Mother        MI in her 7s  . Diabetes Mother   . Macular degeneration Mother   . Hypertension Other   . Diabetes Other   . Alzheimer's disease Other   . Coronary artery disease Brother   . Diabetes Brother   . Diabetes Sister     SOCIAL HISTORY Social History   Tobacco Use  . Smoking status: Former Smoker    Quit date: 01/07/2018    Years since quitting: 1.0  . Smokeless tobacco: Never Used  Substance Use Topics  . Alcohol use: No    Comment: Few beers every other day hx  . Drug use: No         OPHTHALMIC EXAM:  Base Eye Exam    Visual Acuity (Snellen - Linear)      Right Left   Dist [place] 20/50 20/50   Dist ph [place] NI 20/30 -2       Tonometry (Tonopen, 9:40 AM)      Right Left   Pressure 13 13       Pupils      Dark Light Shape React APD   Right 3 2 Round Brisk None   Left 3 2 Round Brisk None       Visual Fields (Counting fingers)      Left Right    Full Full       Extraocular Movement      Right Left    Full, Ortho Full, Ortho       Neuro/Psych    Oriented  x3: Yes   Mood/Affect: Normal       Dilation    Both eyes: 1.0% Mydriacyl, 2.5% Phenylephrine @ 9:40 AM        Slit Lamp and Fundus Exam    Slit Lamp Exam      Right Left   Lids/Lashes Dermatochalasis -  upper lid, mild Meibomian gland dysfunction Dermatochalasis - upper lid, mild Meibomian gland dysfunction   Conjunctiva/Sclera nasal Pinguecula, Melanosis nasal/temporal Pinguecula, Melanosis   Cornea trace-1+ Punctate epithelial erosions Mild Debris in tear film, 1+ Punctate epithelial erosions nasal and inferior   Anterior Chamber deep, narrow temporal angle deep, narrow temporal angle   Iris Round and dilated, No NVI Round and dilated, No NVI   Lens 2+ Nuclear sclerosis, 2+ Cortical cataract 2-3+ Nuclear sclerosis, 2-3+ Cortical cataract, 1+ Posterior subcapsular cataract   Vitreous Vitreous syneresis post vitrectomy       Fundus Exam      Right Left   Disc mild pallor, sharp rim mild pallor, sharp rim   C/D Ratio 0.4 0.5   Macula Blunted foveal reflex, scattered MA, +Epiretinal membrane, +edema - mild improvement, scattered exudates - slightly improved good foveal reflex, scattered Microaneurysms, scattered IRH - cluster just temporal to fovea, temporal macula very ischemic   Vessels severe Vascular attenuation, sclerotic arterioles, +AV crossing changes Vascular attenuation, mild Tortuousity   Periphery Attached, 360 PRP, good posterior laser fill in, scattered IRH Attached, good 360 PRP in place        Refraction    Manifest Refraction      Sphere Cylinder Axis Dist VA   Right +0.50 +0.75 023 20/50   Left -2.00 +1.25 140 20/50          IMAGING AND PROCEDURES  Imaging and Procedures for @TODAY @  OCT, Retina - OU - Both Eyes       Right Eye Quality was good. Central Foveal Thickness: 245. Progression has been stable. Findings include abnormal foveal contour, epiretinal membrane, no SRF, outer retinal atrophy, intraretinal fluid, macular pucker, intraretinal  hyper-reflective material (Persistent IRF/IRHM).   Left Eye Quality was good. Central Foveal Thickness: 254. Progression has improved. Findings include abnormal foveal contour, intraretinal fluid, no SRF, epiretinal membrane, outer retinal atrophy, inner retinal atrophy (Interval improvement in IRF/IRHM).   Notes *Images captured and stored on drive  Diagnosis / Impression:  OU: ERM, diffuse atrophy, +DME OD: Persistent IRF/IRHM OS: Interval improvement in IRF/IRHM  Clinical management:  See below  Abbreviations: NFP - Normal foveal profile. CME - cystoid macular edema. PED - pigment epithelial detachment. IRF - intraretinal fluid. SRF - subretinal fluid. EZ - ellipsoid zone. ERM - epiretinal membrane. ORA - outer retinal atrophy. ORT - outer retinal tubulation. SRHM - subretinal hyper-reflective material        Intravitreal Injection, Pharmacologic Agent - OD - Right Eye       Time Out 02/11/2019. 10:01 AM. Confirmed correct patient, procedure, site, and patient consented.   Anesthesia Topical anesthesia was used. Anesthetic medications included Lidocaine 2%, Proparacaine 0.5%.   Procedure Preparation included 5% betadine to ocular surface, eyelid speculum. A supplied needle was used.   Injection:  1.25 mg Bevacizumab (AVASTIN) SOLN   NDC: 88416-606-30, Lot: [id number] , Expiration date: 03/27/2019   Route: Intravitreal, Site: Right Eye, Waste: 0 mL  Post-op Post injection exam found visual acuity of at least counting fingers. The patient tolerated the procedure well. There were no complications. The patient received written and verbal post procedure care education.        Intravitreal Injection, Pharmacologic Agent - OS - Left Eye       Time Out 02/11/2019. 10:02 AM. Confirmed correct patient, procedure, site, and patient consented.   Anesthesia Topical anesthesia was used. Anesthetic medications included Tetracaine 0.5%, Lidocaine 2%, Proparacaine 0.5%.    Procedure Preparation included 5%  betadine to ocular surface, eyelid speculum. A supplied needle was used.   Injection:  1.25 mg Bevacizumab (AVASTIN) SOLN   NDC: 10932-355-73, Lot: [id number], Expiration date: 03/27/2019   Route: Intravitreal, Site: Left Eye, Waste: 0 mL  Post-op Post injection exam found visual acuity of at least counting fingers. The patient tolerated the procedure well. There were no complications. The patient received written and verbal post procedure care education.        Fluorescein Angiography Optos (Transit OD)       Right Eye   Progression has improved. Early phase findings include vascular perfusion defect, microaneurysm. Mid/Late phase findings include microaneurysm, vascular perfusion defect, leakage (Retinal NV vastly improved).   Left Eye   Progression has been stable. Early phase findings include vascular perfusion defect, microaneurysm, staining. Mid/Late phase findings include staining, leakage, microaneurysm, vascular perfusion defect.   Notes Images stored on drive;   Impression: PDR OU OD: scattered late leaking MA; vascular perfusion defects and enlarged FAZ; Retinal NV vastly improved OS: significant vascular profusion defects and enlarged FAZ; no active NV; +late leaking MA                  ASSESSMENT/PLAN:    ICD-10-CM   1. Proliferative diabetic retinopathy of both eyes with macular edema associated with type 2 diabetes mellitus (HCC)  B76.2831 Intravitreal Injection, Pharmacologic Agent - OD - Right Eye    Intravitreal Injection, Pharmacologic Agent - OS - Left Eye    Bevacizumab (AVASTIN) SOLN 1.25 mg    Bevacizumab (AVASTIN) SOLN 1.25 mg  2. Retinal edema  H35.81 OCT, Retina - OU - Both Eyes  3. Epiretinal membrane (ERM) of both eyes  H35.373   4. Essential hypertension  I10   5. Hypertensive retinopathy of both eyes  H35.033 Fluorescein Angiography Optos (Transit OD)  6. Combined forms of age-related cataract  of both eyes  H25.813   7. Dry eyes  H04.123     1,2. Proliferative diabetic retinopathy w/ DME, OU  - former pt of Dwana Melena at [facility] and [facility] at [place] -- known history of proliferative diabetic retinopathy  - history PPV OS and laser PRP OD with Dr. Manuella Ghazi for PDR OU w/ TRD OS -- 01/25/17  - history of intravitreal anti-VEGF therapy with Dr. Anderson Malta -- last IVE ~01/2018  - s/p IVA OU #1 (02.13.20), #2 (03.12.20), #3 (05.10.20), #4 (06.08.20), #5 (07.06.20), #6 (08.03.20)  - s/p PRP fill in OD (02.27.20) -- good fill in laser in place  - exam shows regression of fine NVD OD, PRP laser in place OU  - FA (08.31.20) shows retinal NV vastly improved OD; significant vascular perfusion defects and increased FAZ OU; late leaking MA OU  - OCT shows persistent IRF/IRHM OD and interval improvement in IRF/IRHM OS  - BCVA relatively stable at 20/40 OD, OS improved to 20/30   - discussed findings and prognosis  - recommend IVA OU #7 today, 08.31.20  - pt wishes to proceed  - RBA of procedure discussed, questions answered  - informed consent obtained and signed  - see procedure note  - f/u 4 weeks, DFE/OCT  3. Epiretinal membrane, both eyes   - mild ERM  - asymptomatic, no metamorphopsia  - no indication for surgery at this time  - monitor for now  4,5. Hypertensive retinopathy OU  - discussed importance of tight BP control  - monitor  6. Combined form cataract OU   - The symptoms of cataract,  surgical options, and treatments and risks were discussed with patient.  - discussed diagnosis and progression  - OS > OD, likely sequela from PPV OS  - approaching visual significance  - monitor for now  7. Dry eyes OU  - improving  - recommend artificial tears and lubricating ointment as needed    Ophthalmic Meds Ordered this visit:  Meds ordered this encounter  Medications  . Bevacizumab (AVASTIN) SOLN 1.25 mg  . Bevacizumab (AVASTIN) SOLN 1.25 mg       Return in  about 4 weeks (around 03/11/2019) for f/u PDR OU, DFE, OCT.  There are no Patient Instructions on file for this visit.   Explained the diagnoses, plan, and follow up with the patient and they expressed understanding.  Patient expressed understanding of the importance of proper follow up care.   This document serves as a record of services personally performed by Gardiner Sleeper, MD, PhD. It was created on their behalf by Roselee Nova, COMT. The creation of this record is the provider's dictation and/or activities during the visit.  Electronically signed by: Roselee Nova, COMT 02/11/19 8:17 PM   This document serves as a record of services personally performed by Gardiner Sleeper, MD, PhD. It was created on their behalf by Ernest Mallick, OA, an ophthalmic assistant. The creation of this record is the provider's dictation and/or activities during the visit.    Electronically signed by: Ernest Mallick, OA  08.31.2020 8:17 PM    Gardiner Sleeper, M.D., Ph.D. Diseases & Surgery of the Retina and Vitreous [facility] [place]  I have reviewed the above documentation for accuracy and completeness, and I agree with the above. Gardiner Sleeper, M.D., Ph.D. 02/11/19 8:17 PM    Abbreviations: M myopia (nearsighted); A astigmatism; H hyperopia (farsighted); P presbyopia; Mrx spectacle prescription;  CTL contact lenses; OD right eye; OS left eye; OU both eyes  XT exotropia; ET esotropia; PEK punctate epithelial keratitis; PEE punctate epithelial erosions; DES dry eye syndrome; MGD meibomian gland dysfunction; ATs artificial tears; PFAT's preservative free artificial tears; [place] nuclear sclerotic cataract; PSC posterior subcapsular cataract; ERM epi-retinal membrane; PVD posterior vitreous detachment; RD retinal detachment; DM diabetes mellitus; DR diabetic retinopathy; NPDR non-proliferative diabetic retinopathy; PDR proliferative diabetic retinopathy; CSME clinically significant macular edema; DME  diabetic macular edema; dbh dot blot hemorrhages; CWS cotton wool spot; POAG primary open angle glaucoma; C/D cup-to-disc ratio; HVF humphrey visual field; GVF goldmann visual field; OCT optical coherence tomography; IOP intraocular pressure; BRVO Branch retinal vein occlusion; CRVO central retinal vein occlusion; CRAO central retinal artery occlusion; BRAO branch retinal artery occlusion; RT retinal tear; SB scleral buckle; PPV pars plana vitrectomy; VH Vitreous hemorrhage; PRP panretinal laser photocoagulation; IVK intravitreal kenalog; VMT vitreomacular traction; MH Macular hole;  NVD neovascularization of the disc; NVE neovascularization elsewhere; AREDS age related eye disease study; ARMD age related macular degeneration; POAG primary open angle glaucoma; EBMD epithelial/anterior basement membrane dystrophy; ACIOL anterior chamber intraocular lens; IOL intraocular lens; PCIOL posterior chamber intraocular lens; Phaco/IOL phacoemulsification with intraocular lens placement; [place] photorefractive keratectomy; LASIK laser assisted in situ keratomileusis; HTN hypertension; DM diabetes mellitus; COPD chronic obstructive pulmonary disease

## 2019-02-11 ENCOUNTER — Other Ambulatory Visit: Payer: Self-pay

## 2019-02-11 ENCOUNTER — Encounter (INDEPENDENT_AMBULATORY_CARE_PROVIDER_SITE_OTHER): Payer: Self-pay | Admitting: Ophthalmology

## 2019-02-11 ENCOUNTER — Ambulatory Visit (INDEPENDENT_AMBULATORY_CARE_PROVIDER_SITE_OTHER): Payer: Medicare (Managed Care) | Admitting: Ophthalmology

## 2019-02-11 DIAGNOSIS — I10 Essential (primary) hypertension: Secondary | ICD-10-CM | POA: Diagnosis not present

## 2019-02-11 DIAGNOSIS — H35033 Hypertensive retinopathy, bilateral: Secondary | ICD-10-CM

## 2019-02-11 DIAGNOSIS — H35373 Puckering of macula, bilateral: Secondary | ICD-10-CM | POA: Diagnosis not present

## 2019-02-11 DIAGNOSIS — H04123 Dry eye syndrome of bilateral lacrimal glands: Secondary | ICD-10-CM

## 2019-02-11 DIAGNOSIS — H25813 Combined forms of age-related cataract, bilateral: Secondary | ICD-10-CM

## 2019-02-11 DIAGNOSIS — H3581 Retinal edema: Secondary | ICD-10-CM

## 2019-02-11 DIAGNOSIS — E113513 Type 2 diabetes mellitus with proliferative diabetic retinopathy with macular edema, bilateral: Secondary | ICD-10-CM | POA: Diagnosis not present

## 2019-02-11 MED ORDER — BEVACIZUMAB CHEMO INJECTION 1.25MG/0.05ML SYRINGE FOR KALEIDOSCOPE
1.2500 mg | INTRAVITREAL | Status: AC | PRN
  Administered 2019-02-11: 1.25 mg via INTRAVITREAL

## 2019-02-20 ENCOUNTER — Other Ambulatory Visit (HOSPITAL_COMMUNITY): Payer: Self-pay | Admitting: Internal Medicine

## 2019-02-20 ENCOUNTER — Other Ambulatory Visit: Payer: Self-pay | Admitting: Internal Medicine

## 2019-02-20 DIAGNOSIS — Z8619 Personal history of other infectious and parasitic diseases: Secondary | ICD-10-CM

## 2019-02-26 ENCOUNTER — Ambulatory Visit (HOSPITAL_COMMUNITY)
Admission: RE | Admit: 2019-02-26 | Discharge: 2019-02-26 | Disposition: A | Discharge status: Home / Self Care | Payer: Medicare (Managed Care) | Source: Ambulatory Visit | Attending: Internal Medicine | Admitting: Internal Medicine

## 2019-02-26 ENCOUNTER — Encounter (HOSPITAL_COMMUNITY): Payer: Self-pay

## 2019-02-26 ENCOUNTER — Other Ambulatory Visit: Payer: Self-pay

## 2019-02-26 ENCOUNTER — Ambulatory Visit (HOSPITAL_COMMUNITY): Payer: Medicare (Managed Care)

## 2019-02-26 DIAGNOSIS — Z8619 Personal history of other infectious and parasitic diseases: Secondary | ICD-10-CM | POA: Insufficient documentation

## 2019-03-10 NOTE — Progress Notes (Signed)
[facility] Note  03/11/2019     CHIEF COMPLAINT Patient presents for Retina Follow Up   HISTORY OF PRESENT ILLNESS: John Jacobson is a 63 y.o. male who presents to the clinic today for:   HPI    Retina Follow Up    Patient presents with  Diabetic Retinopathy.  In both eyes.  This started weeks ago.  Severity is moderate.  Duration of weeks.  Since onset it is stable.  I, the attending physician,  performed the HPI with the patient and updated documentation appropriately.          Comments    Patient states vision OU is about the same.  He denies eye pain or discomfort and denies any new or worsening floaters or fol OU.       Last edited by Bernarda Caffey, MD on 03/11/2019 10:43 AM. (History)    pt states he feels like his left eye vision is worse today   Referring physician: Angelica Pou, MD [place],  [place] [address]  HISTORICAL INFORMATION:   Selected notes from the MEDICAL RECORD NUMBER Referred by Dr. Dorian Pod [facility]) for DM exam LEE:  Ocular Hx- PMH-    CURRENT MEDICATIONS: Current Outpatient Medications (Ophthalmic Drugs)  Medication Sig  . carboxymethylcellulose 1 % ophthalmic solution Place 1 drop into both eyes 4 (four) times daily.   No current facility-administered medications for this visit.  (Ophthalmic Drugs)   Current Outpatient Medications (Other)  Medication Sig  . acetaminophen (TYLENOL) 500 MG tablet Take 500 mg by mouth 2 (two) times daily as needed (for pain or headaches).  Marland Kitchen amoxicillin-clavulanate (AUGMENTIN) 875-125 MG tablet Take 1 tablet by mouth every 12 (twelve) hours.  Marland Kitchen aspirin EC 325 MG EC tablet Take 1 tablet (325 mg total) by mouth daily.  Marland Kitchen atorvastatin (LIPITOR) 20 MG tablet Take 20 mg by mouth daily.  . carvedilol (COREG) 25 MG tablet Take 12.5 mg by mouth every 12 (twelve) hours.  . Cholecalciferol (VITAMIN D3) 2000 units TABS Take 4,000 Units by mouth daily.   . cloNIDine (CATAPRES) 0.3 MG tablet Take 1 tablet (0.3 mg total) by mouth 3 (three) times daily.  . clopidogrel (PLAVIX) 75 MG tablet Take 75 mg by mouth daily.  . Cyanocobalamin (VITAMIN B-12 PO) Take 1 tablet by mouth daily.  Marland Kitchen doxazosin (CARDURA) 8 MG tablet Take 4 mg by mouth 2 (two) times daily.  . ferrous sulfate 325 (65 FE) MG tablet Take 325 mg by mouth every Monday, Wednesday, and Friday.  . furosemide (LASIX) 40 MG tablet Take 0.5 tablets (20 mg total) by mouth daily.  Marland Kitchen glucose 4 GM chewable tablet Chew 1 tablet by mouth daily as needed for low blood sugar.  . hydrALAZINE (APRESOLINE) 100 MG tablet Take 1 tablet (100 mg total) by mouth 3 (three) times daily.  Marland Kitchen HYDROcodone-acetaminophen (NORCO/VICODIN) 5-325 MG tablet Take 1 tablet by mouth every 6 (six) hours as needed.  . insulin glargine (LANTUS) 100 unit/mL SOPN Inject 0.3 mLs (30 Units total) into the skin at bedtime.  . isosorbide mononitrate (IMDUR) 60 MG 24 hr tablet Take 1 tablet (60 mg total) by mouth daily.  . montelukast (SINGULAIR) 10 MG tablet Take 10 mg by mouth at bedtime.  . polyethylene glycol (MIRALAX / GLYCOLAX) packet Take 17 g by mouth daily. (Patient taking differently: Take 17 g by mouth daily as needed for mild constipation. )  .  potassium chloride (K-DUR) 10 MEQ tablet Take 1 tablet (10 mEq total) by mouth daily.  Marland Kitchen senna (SENOKOT) 8.6 MG TABS tablet Take 1 tablet (8.6 mg total) by mouth daily. (Patient taking differently: Take 1 tablet by mouth daily as needed for mild constipation. )  . Skin Protectants, Misc. (EUCERIN) cream Apply 1 application topically See admin instructions. Apply to both feet daily after showering   Current Facility-Administered Medications (Other)  Medication Route  . Bevacizumab (AVASTIN) SOLN 1.25 mg Intravitreal  . Bevacizumab (AVASTIN) SOLN 1.25 mg Intravitreal  . Bevacizumab (AVASTIN) SOLN 1.25 mg Intravitreal  . Bevacizumab (AVASTIN) SOLN 1.25 mg Intravitreal       REVIEW OF SYSTEMS: ROS    Positive for: Endocrine, Cardiovascular, Eyes   Negative for: Constitutional, Gastrointestinal, Neurological, Skin, Genitourinary, Musculoskeletal, HENT, Respiratory, Psychiatric, Allergic/Imm, Heme/Lymph   Last edited by Doneen Poisson on 03/11/2019  9:53 AM. (History)       ALLERGIES Allergies  Allergen Reactions  . Amlodipine Besy-Benazepril Hcl Anaphylaxis, Shortness Of Breath and Swelling    Mouth and tongue swelling  . Shellfish Allergy Anaphylaxis, Shortness Of Breath and Swelling    PAST MEDICAL HISTORY Past Medical History:  Diagnosis Date  . Asthma   . Back pain   . CHF (congestive heart failure) ([place])   . Chronic kidney disease   . DM (diabetes mellitus) ([place])   . Hepatitis C   . HTN (hypertension)   . RBBB (right bundle branch block)   . Stroke [facility]) 2018   Past Surgical History:  Procedure Laterality Date  . BACK SURGERY  1995  . PARS PLANA VITRECTOMY Left 01/25/2017  . TOE AMPUTATION  2016    FAMILY HISTORY Family History  Problem Relation Age of Onset  . Coronary artery disease Mother        MI in her 10s  . Diabetes Mother   . Macular degeneration Mother   . Hypertension Other   . Diabetes Other   . Alzheimer's disease Other   . Coronary artery disease Brother   . Diabetes Brother   . Diabetes Sister     SOCIAL HISTORY Social History   Tobacco Use  . Smoking status: Former Smoker    Quit date: 01/07/2018    Years since quitting: 1.1  . Smokeless tobacco: Never Used  Substance Use Topics  . Alcohol use: No    Comment: Few beers every other day hx  . Drug use: No         OPHTHALMIC EXAM:  Base Eye Exam    Visual Acuity (Snellen - Linear)      Right Left   Dist [place] 20/50 -2 20/60 -2   Dist ph [place] 20/40 -1 20/40 -1       Tonometry (Tonopen, 9:56 AM)      Right Left   Pressure 16 15       Pupils      Dark Light Shape React APD   Right 3 2 Round Minimal 0   Left 3 2 Round Minimal 0        Visual Fields      Left Right    Full Full       Extraocular Movement      Right Left    Full Full       Neuro/Psych    Oriented x3: Yes   Mood/Affect: Normal       Dilation    Both eyes: 1.0% Mydriacyl, 2.5% Phenylephrine @ 9:56 AM  Slit Lamp and Fundus Exam    Slit Lamp Exam      Right Left   Lids/Lashes Dermatochalasis - upper lid, mild Meibomian gland dysfunction Dermatochalasis - upper lid, mild Meibomian gland dysfunction   Conjunctiva/Sclera nasal Pinguecula, Melanosis nasal/temporal Pinguecula, Melanosis   Cornea 1+ Punctate epithelial erosions Mild Debris in tear film, 1+ Punctate epithelial erosions nasal and inferior   Anterior Chamber deep, narrow temporal angle deep, narrow temporal angle   Iris Round and dilated, No NVI Round and dilated, No NVI   Lens 2-3+ Nuclear sclerosis, 2-3+ Cortical cataract 3+ Nuclear sclerosis, 2-3+ Cortical cataract, 1+ Posterior subcapsular cataract   Vitreous Vitreous syneresis post vitrectomy       Fundus Exam      Right Left   Disc mild pallor, sharp rim mild pallor, sharp rim   C/D Ratio 0.4 0.5   Macula Blunted foveal reflex, scattered MA, +Epiretinal membrane, +edema - mild improvement, scattered exudates - slightly improved Flat, blunted foveal reflex, scattered Microaneurysms, scattered IRH - cluster just temporal to fovea, temporal macula very ischemic   Vessels severe Vascular attenuation, sclerotic arterioles, +AV crossing changes, Copper wiring Vascular attenuation, mild Tortuousity   Periphery Attached, 360 PRP, good posterior laser fill in, scattered IRH Attached, good 360 PRP in place          IMAGING AND PROCEDURES  Imaging and Procedures for @TODAY @  OCT, Retina - OU - Both Eyes       Right Eye Quality was good. Central Foveal Thickness: 236. Progression has improved. Findings include abnormal foveal contour, epiretinal membrane, no SRF, outer retinal atrophy, intraretinal fluid, macular pucker,  intraretinal hyper-reflective material (Interval improvement in IRF/IRHM).   Left Eye Quality was borderline. Central Foveal Thickness: 254. Progression has worsened. Findings include abnormal foveal contour, intraretinal fluid, no SRF, epiretinal membrane, outer retinal atrophy, inner retinal atrophy (Mild Interval increase in IRF/IRHM).   Notes *Images captured and stored on drive  Diagnosis / Impression:  OU: ERM, diffuse atrophy, +DME OD: Interval improvement in IRF/IRHM OS: Mild Interval increase in IRF/IRHM  Clinical management:  See below  Abbreviations: NFP - Normal foveal profile. CME - cystoid macular edema. PED - pigment epithelial detachment. IRF - intraretinal fluid. SRF - subretinal fluid. EZ - ellipsoid zone. ERM - epiretinal membrane. ORA - outer retinal atrophy. ORT - outer retinal tubulation. SRHM - subretinal hyper-reflective material        Intravitreal Injection, Pharmacologic Agent - OD - Right Eye       Time Out 03/11/2019. 10:11 AM. Confirmed correct patient, procedure, site, and patient consented.   Anesthesia Topical anesthesia was used. Anesthetic medications included Lidocaine 2%, Proparacaine 0.5%.   Procedure Preparation included 5% betadine to ocular surface, eyelid speculum. A 30 gauge needle was used.   Injection:  1.25 mg Bevacizumab (AVASTIN) SOLN   NDC: 62130-865-78, Lot: [id number] , Expiration date: 05/30/2019   Route: Intravitreal, Site: Right Eye, Waste: 0 mL  Post-op Post injection exam found visual acuity of at least counting fingers. The patient tolerated the procedure well. There were no complications. The patient received written and verbal post procedure care education.        Intravitreal Injection, Pharmacologic Agent - OS - Left Eye       Time Out 03/11/2019. 10:14 AM. Confirmed correct patient, procedure, site, and patient consented.   Anesthesia Topical anesthesia was used. Anesthetic medications included  Lidocaine 2%, Lidocaine 4%, Proparacaine 0.5%.   Procedure Preparation included 5% betadine to ocular surface, eyelid  speculum. A 30 gauge needle was used.   Injection:  1.25 mg Bevacizumab (AVASTIN) SOLN   NDC: 25427-062-37, Lot: [id number] , Expiration date: 04/24/2019   Route: Intravitreal, Site: Left Eye, Waste: 0 mL  Post-op Post injection exam found visual acuity of at least counting fingers. The patient tolerated the procedure well. There were no complications. The patient received written and verbal post procedure care education.                 ASSESSMENT/PLAN:    ICD-10-CM   1. Proliferative diabetic retinopathy of both eyes with macular edema associated with type 2 diabetes mellitus (HCC)  24/04/2019 Intravitreal Injection, Pharmacologic Agent - OD - Right Eye    Intravitreal Injection, Pharmacologic Agent - OS - Left Eye    Bevacizumab (AVASTIN) SOLN 1.25 mg    Bevacizumab (AVASTIN) SOLN 1.25 mg  2. Retinal edema  H35.81 OCT, Retina - OU - Both Eyes  3. Epiretinal membrane (ERM) of both eyes  H35.373   4. Essential hypertension  I10   5. Hypertensive retinopathy of both eyes  H35.033   6. Combined forms of age-related cataract of both eyes  H25.813   7. Dry eyes  H04.123     1,2. Proliferative diabetic retinopathy w/ DME, OU  - former pt of I33.8250 at [facility] and [facility] at [place] -- known history of proliferative diabetic retinopathy  - history PPV OS and laser PRP OD with Dr. Seiling for PDR OU w/ TRD OS -- 01/25/17  - history of intravitreal anti-VEGF therapy with Dr. 02/07/17 -- last IVE ~01/2018  - s/p IVA OU #1 (02.13.20), #2 (03.12.20), #3 (05.10.20), #4 (06.08.20), #5 (07.06.20), #6 (08.03.20), #7 (08.31.20)  - s/p PRP fill in OD (02.27.20) -- good fill in laser in place  - exam shows regression of fine NVD OD, PRP laser in place OU  - FA (08.31.20) shows retinal NV vastly improved OD; significant vascular perfusion defects and increased FAZ  OU; late leaking MA OU  - OCT shows interval improvement IRF/IRHM OD and interval increase in IRF/IRHM OS  - BCVA relatively stable at 20/40 OD, OS slightly decreased to 20/40 from 20/30    - discussed findings and prognosis  - recommend IVA OU #8 today, 09.28.20  - pt wishes to proceed  - RBA of procedure discussed, questions answered  - informed consent obtained and signed  - see procedure note  - f/u 4 weeks, DFE/OCT  3. Epiretinal membrane, both eyes   - mild ERM  - asymptomatic, no metamorphopsia  - no indication for surgery at this time  - monitor for now  4,5. Hypertensive retinopathy OU  - discussed importance of tight BP control  - monitor  6. Combined form cataract OU   - The symptoms of cataract, surgical options, and treatments and risks were discussed with patient.  - discussed diagnosis and progression  - OS > OD, likely sequela from PPV OS  - approaching visual significance  - recommend referral to general ophthalmology for cat eval  - pt states Dr. 10.11.20 is in the process of making a referral for him to general ophthalmology  - we are happy to assist with referral process if needed  7. Dry eyes OU  - improving  - recommend artificial tears and lubricating ointment as needed    Ophthalmic Meds Ordered this visit:  Meds ordered this encounter  Medications  . Bevacizumab (AVASTIN) SOLN 1.25 mg  . Bevacizumab (AVASTIN) SOLN 1.25  mg       Return in about 4 weeks (around 04/08/2019) for f/u PDR OU, DFE, OCT.  There are no Patient Instructions on file for this visit.   Explained the diagnoses, plan, and follow up with the patient and they expressed understanding.  Patient expressed understanding of the importance of proper follow up care.   This document serves as a record of services personally performed by Gardiner Sleeper, MD, PhD. It was created on their behalf by Ernest Mallick, OA, an ophthalmic assistant. The creation of this record is the  provider's dictation and/or activities during the visit.    Electronically signed by: Ernest Mallick, OA  09.27.2020 1:09 PM    Gardiner Sleeper, M.D., Ph.D. Diseases & Surgery of the Retina and Vitreous [facility]  I have reviewed the above documentation for accuracy and completeness, and I agree with the above. Gardiner Sleeper, M.D., Ph.D. 03/11/19 1:10 PM     Abbreviations: M myopia (nearsighted); A astigmatism; H hyperopia (farsighted); P presbyopia; Mrx spectacle prescription;  CTL contact lenses; OD right eye; OS left eye; OU both eyes  XT exotropia; ET esotropia; PEK punctate epithelial keratitis; PEE punctate epithelial erosions; DES dry eye syndrome; MGD meibomian gland dysfunction; ATs artificial tears; PFAT's preservative free artificial tears; Fisher nuclear sclerotic cataract; PSC posterior subcapsular cataract; ERM epi-retinal membrane; PVD posterior vitreous detachment; RD retinal detachment; DM diabetes mellitus; DR diabetic retinopathy; NPDR non-proliferative diabetic retinopathy; PDR proliferative diabetic retinopathy; CSME clinically significant macular edema; DME diabetic macular edema; dbh dot blot hemorrhages; CWS cotton wool spot; POAG primary open angle glaucoma; C/D cup-to-disc ratio; HVF humphrey visual field; GVF goldmann visual field; OCT optical coherence tomography; IOP intraocular pressure; BRVO Branch retinal vein occlusion; CRVO central retinal vein occlusion; CRAO central retinal artery occlusion; BRAO branch retinal artery occlusion; RT retinal tear; SB scleral buckle; PPV pars plana vitrectomy; VH Vitreous hemorrhage; PRP panretinal laser photocoagulation; IVK intravitreal kenalog; VMT vitreomacular traction; MH Macular hole;  NVD neovascularization of the disc; NVE neovascularization elsewhere; AREDS age related eye disease study; ARMD age related macular degeneration; POAG primary open angle glaucoma; EBMD epithelial/anterior basement membrane  dystrophy; ACIOL anterior chamber intraocular lens; IOL intraocular lens; PCIOL posterior chamber intraocular lens; Phaco/IOL phacoemulsification with intraocular lens placement; [place] photorefractive keratectomy; LASIK laser assisted in situ keratomileusis; HTN hypertension; DM diabetes mellitus; COPD chronic obstructive pulmonary disease

## 2019-03-11 ENCOUNTER — Encounter (INDEPENDENT_AMBULATORY_CARE_PROVIDER_SITE_OTHER): Payer: Self-pay | Admitting: Ophthalmology

## 2019-03-11 ENCOUNTER — Ambulatory Visit (INDEPENDENT_AMBULATORY_CARE_PROVIDER_SITE_OTHER): Payer: Medicare (Managed Care) | Admitting: Ophthalmology

## 2019-03-11 ENCOUNTER — Other Ambulatory Visit: Payer: Self-pay

## 2019-03-11 DIAGNOSIS — H35033 Hypertensive retinopathy, bilateral: Secondary | ICD-10-CM

## 2019-03-11 DIAGNOSIS — I10 Essential (primary) hypertension: Secondary | ICD-10-CM | POA: Diagnosis not present

## 2019-03-11 DIAGNOSIS — H04123 Dry eye syndrome of bilateral lacrimal glands: Secondary | ICD-10-CM

## 2019-03-11 DIAGNOSIS — H35373 Puckering of macula, bilateral: Secondary | ICD-10-CM

## 2019-03-11 DIAGNOSIS — H3581 Retinal edema: Secondary | ICD-10-CM | POA: Diagnosis not present

## 2019-03-11 DIAGNOSIS — E113513 Type 2 diabetes mellitus with proliferative diabetic retinopathy with macular edema, bilateral: Secondary | ICD-10-CM

## 2019-03-11 DIAGNOSIS — H25813 Combined forms of age-related cataract, bilateral: Secondary | ICD-10-CM

## 2019-03-11 MED ORDER — BEVACIZUMAB CHEMO INJECTION 1.25MG/0.05ML SYRINGE FOR KALEIDOSCOPE
1.2500 mg | INTRAVITREAL | Status: AC | PRN
  Administered 2019-03-11: 1.25 mg via INTRAVITREAL

## 2019-04-08 ENCOUNTER — Other Ambulatory Visit: Payer: Self-pay

## 2019-04-08 ENCOUNTER — Ambulatory Visit (INDEPENDENT_AMBULATORY_CARE_PROVIDER_SITE_OTHER): Payer: Medicare (Managed Care) | Admitting: Ophthalmology

## 2019-04-08 ENCOUNTER — Encounter (INDEPENDENT_AMBULATORY_CARE_PROVIDER_SITE_OTHER): Payer: Self-pay | Admitting: Ophthalmology

## 2019-04-08 DIAGNOSIS — H3581 Retinal edema: Secondary | ICD-10-CM

## 2019-04-08 DIAGNOSIS — H35033 Hypertensive retinopathy, bilateral: Secondary | ICD-10-CM

## 2019-04-08 DIAGNOSIS — I10 Essential (primary) hypertension: Secondary | ICD-10-CM | POA: Diagnosis not present

## 2019-04-08 DIAGNOSIS — H25813 Combined forms of age-related cataract, bilateral: Secondary | ICD-10-CM

## 2019-04-08 DIAGNOSIS — H35373 Puckering of macula, bilateral: Secondary | ICD-10-CM

## 2019-04-08 DIAGNOSIS — E113513 Type 2 diabetes mellitus with proliferative diabetic retinopathy with macular edema, bilateral: Secondary | ICD-10-CM

## 2019-04-08 DIAGNOSIS — H04123 Dry eye syndrome of bilateral lacrimal glands: Secondary | ICD-10-CM

## 2019-04-08 MED ORDER — BEVACIZUMAB CHEMO INJECTION 1.25MG/0.05ML SYRINGE FOR KALEIDOSCOPE
1.2500 mg | INTRAVITREAL | Status: AC | PRN
  Administered 2019-04-08: 18:00:00 1.25 mg via INTRAVITREAL

## 2019-04-08 NOTE — Progress Notes (Signed)
[facility] Note  04/08/2019     CHIEF COMPLAINT Patient presents for Retina Follow Up   HISTORY OF PRESENT ILLNESS: John Jacobson is a 63 y.o. male who presents to the clinic today for:   HPI    Retina Follow Up    Patient presents with  Diabetic Retinopathy.  In both eyes.  Severity is moderate.  Duration of 4 weeks.  Since onset it is stable.  I, the attending physician,  performed the HPI with the patient and updated documentation appropriately.          Comments    Pt states vision is same since last visit, he denies flashes, floaters or pain, he has not checked his blood sugar this morning, his last A1c was 7.8       Last edited by Bernarda Caffey, MD on 04/08/2019 10:38 AM. (History)    pt states he feels like his left eye vision is worse today   Referring physician: Angelica Pou, MD [place],  [place] [address]  HISTORICAL INFORMATION:   Selected notes from the MEDICAL RECORD NUMBER Referred by Dr. Dorian Pod [facility]) for DM exam LEE:  Ocular Hx- PMH-    CURRENT MEDICATIONS: Current Outpatient Medications (Ophthalmic Drugs)  Medication Sig  . carboxymethylcellulose 1 % ophthalmic solution Place 1 drop into both eyes 4 (four) times daily.   No current facility-administered medications for this visit.  (Ophthalmic Drugs)   Current Outpatient Medications (Other)  Medication Sig  . acetaminophen (TYLENOL) 500 MG tablet Take 500 mg by mouth 2 (two) times daily as needed (for pain or headaches).  Marland Kitchen amoxicillin-clavulanate (AUGMENTIN) 875-125 MG tablet Take 1 tablet by mouth every 12 (twelve) hours.  Marland Kitchen aspirin EC 325 MG EC tablet Take 1 tablet (325 mg total) by mouth daily.  Marland Kitchen atorvastatin (LIPITOR) 20 MG tablet Take 20 mg by mouth daily.  . carvedilol (COREG) 25 MG tablet Take 12.5 mg by mouth every 12 (twelve) hours.  . Cholecalciferol (VITAMIN D3) 2000 units TABS Take 4,000 Units by mouth daily.   . cloNIDine (CATAPRES) 0.3 MG tablet Take 1 tablet (0.3 mg total) by mouth 3 (three) times daily.  . clopidogrel (PLAVIX) 75 MG tablet Take 75 mg by mouth daily.  . Cyanocobalamin (VITAMIN B-12 PO) Take 1 tablet by mouth daily.  Marland Kitchen doxazosin (CARDURA) 8 MG tablet Take 4 mg by mouth 2 (two) times daily.  . ferrous sulfate 325 (65 FE) MG tablet Take 325 mg by mouth every Monday, Wednesday, and Friday.  . furosemide (LASIX) 40 MG tablet Take 0.5 tablets (20 mg total) by mouth daily.  Marland Kitchen glucose 4 GM chewable tablet Chew 1 tablet by mouth daily as needed for low blood sugar.  . hydrALAZINE (APRESOLINE) 100 MG tablet Take 1 tablet (100 mg total) by mouth 3 (three) times daily.  Marland Kitchen HYDROcodone-acetaminophen (NORCO/VICODIN) 5-325 MG tablet Take 1 tablet by mouth every 6 (six) hours as needed.  . insulin glargine (LANTUS) 100 unit/mL SOPN Inject 0.3 mLs (30 Units total) into the skin at bedtime.  . isosorbide mononitrate (IMDUR) 60 MG 24 hr tablet Take 1 tablet (60 mg total) by mouth daily.  . montelukast (SINGULAIR) 10 MG tablet Take 10 mg by mouth at bedtime.  . polyethylene glycol (MIRALAX / GLYCOLAX) packet Take 17 g by mouth daily. (Patient taking differently: Take 17 g by mouth daily as needed for mild constipation. )  . potassium  chloride (K-DUR) 10 MEQ tablet Take 1 tablet (10 mEq total) by mouth daily.  Marland Kitchen senna (SENOKOT) 8.6 MG TABS tablet Take 1 tablet (8.6 mg total) by mouth daily. (Patient taking differently: Take 1 tablet by mouth daily as needed for mild constipation. )  . Skin Protectants, Misc. (EUCERIN) cream Apply 1 application topically See admin instructions. Apply to both feet daily after showering   Current Facility-Administered Medications (Other)  Medication Route  . Bevacizumab (AVASTIN) SOLN 1.25 mg Intravitreal  . Bevacizumab (AVASTIN) SOLN 1.25 mg Intravitreal  . Bevacizumab (AVASTIN) SOLN 1.25 mg Intravitreal  . Bevacizumab (AVASTIN) SOLN 1.25 mg Intravitreal       REVIEW OF SYSTEMS: ROS    Positive for: Endocrine, Cardiovascular, Eyes, Respiratory   Negative for: Constitutional, Gastrointestinal, Neurological, Skin, Genitourinary, Musculoskeletal, HENT, Psychiatric, Allergic/Imm, Heme/Lymph   Last edited by Debbrah Alar, COT on 04/08/2019  9:18 AM. (History)       ALLERGIES Allergies  Allergen Reactions  . Amlodipine Besy-Benazepril Hcl Anaphylaxis, Shortness Of Breath and Swelling    Mouth and tongue swelling  . Shellfish Allergy Anaphylaxis, Shortness Of Breath and Swelling    PAST MEDICAL HISTORY Past Medical History:  Diagnosis Date  . Asthma   . Back pain   . CHF (congestive heart failure) ([place])   . Chronic kidney disease   . DM (diabetes mellitus) ([place])   . Hepatitis C   . HTN (hypertension)   . RBBB (right bundle branch block)   . Stroke [facility]) 2018   Past Surgical History:  Procedure Laterality Date  . BACK SURGERY  1995  . PARS PLANA VITRECTOMY Left 01/25/2017  . TOE AMPUTATION  2016    FAMILY HISTORY Family History  Problem Relation Age of Onset  . Coronary artery disease Mother        MI in her 83s  . Diabetes Mother   . Macular degeneration Mother   . Hypertension Other   . Diabetes Other   . Alzheimer's disease Other   . Coronary artery disease Brother   . Diabetes Brother   . Diabetes Sister     SOCIAL HISTORY Social History   Tobacco Use  . Smoking status: Former Smoker    Quit date: 01/07/2018    Years since quitting: 1.2  . Smokeless tobacco: Never Used  Substance Use Topics  . Alcohol use: No    Comment: Few beers every other day hx  . Drug use: No         OPHTHALMIC EXAM:  Base Eye Exam    Visual Acuity (Snellen - Linear)      Right Left   Dist [place] 20/40 -2 20/60   Dist ph [place] 20/40 +1 20/40 +1       Tonometry (Tonopen, 9:25 AM)      Right Left   Pressure 19 15       Pupils      Dark Light Shape React APD   Right 3 2 Round Slow None   Left 3 2 Round Slow None        Visual Fields (Counting fingers)      Left Right    Full Full       Extraocular Movement      Right Left    Full, Ortho Full, Ortho       Neuro/Psych    Oriented x3: Yes   Mood/Affect: Normal       Dilation    Both eyes: 1.0% Mydriacyl, 2.5% Phenylephrine @  9:25 AM        Slit Lamp and Fundus Exam    Slit Lamp Exam      Right Left   Lids/Lashes Dermatochalasis - upper lid, mild Meibomian gland dysfunction Dermatochalasis - upper lid, mild Meibomian gland dysfunction   Conjunctiva/Sclera nasal Pinguecula, Melanosis nasal/temporal Pinguecula, Melanosis   Cornea 1+ Punctate epithelial erosions Mild Debris in tear film, 1+ Punctate epithelial erosions nasal and inferior   Anterior Chamber deep, narrow temporal angle deep, narrow temporal angle   Iris Round and dilated, No NVI Round and dilated, No NVI   Lens 2-3+ Nuclear sclerosis, 2-3+ Cortical cataract 3+ Nuclear sclerosis, 2-3+ Cortical cataract, 1+ Posterior subcapsular cataract   Vitreous Vitreous syneresis post vitrectomy       Fundus Exam      Right Left   Disc mild pallor, sharp rim mild pallor, sharp rim   C/D Ratio 0.4 0.5   Macula Blunted foveal reflex, scattered MA, +Epiretinal membrane, +edema - mild improvement, scattered exudates - persistent Flat, blunted foveal reflex, scattered Microaneurysms, scattered IRH - cluster just temporal to fovea, temporal macula very ischemic   Vessels severe Vascular attenuation, sclerotic arterioles, +AV crossing changes, Copper wiring Vascular attenuation, mild Tortuousity   Periphery Attached, 360 PRP, good posterior laser fill in, scattered IRH Attached, good 360 PRP in place          IMAGING AND PROCEDURES  Imaging and Procedures for @TODAY @  OCT, Retina - OU - Both Eyes       Right Eye Quality was good. Central Foveal Thickness: 241. Progression has worsened. Findings include abnormal foveal contour, epiretinal membrane, no SRF, outer retinal atrophy,  intraretinal fluid, macular pucker, intraretinal hyper-reflective material (Mild Interval increase in IRF ST macula).   Left Eye Quality was good. Central Foveal Thickness: 262. Progression has been stable. Findings include abnormal foveal contour, intraretinal fluid, no SRF, epiretinal membrane, outer retinal atrophy, inner retinal atrophy (Persistent IRF).   Notes *Images captured and stored on drive  Diagnosis / Impression:  OU: ERM, diffuse atrophy, +DME OD: Mild Interval increase in IRF ST macula OS: persistent IRF/IRHM  Clinical management:  See below  Abbreviations: NFP - Normal foveal profile. CME - cystoid macular edema. PED - pigment epithelial detachment. IRF - intraretinal fluid. SRF - subretinal fluid. EZ - ellipsoid zone. ERM - epiretinal membrane. ORA - outer retinal atrophy. ORT - outer retinal tubulation. SRHM - subretinal hyper-reflective material        Intravitreal Injection, Pharmacologic Agent - OD - Right Eye       Time Out 04/08/2019. 11:00 AM. Confirmed correct patient, procedure, site, and patient consented.   Anesthesia Topical anesthesia was used. Anesthetic medications included Lidocaine 2%, Proparacaine 0.5%.   Procedure Preparation included 5% betadine to ocular surface, eyelid speculum. A supplied needle was used.   Injection:  1.25 mg Bevacizumab (AVASTIN) SOLN   NDC: 81017-510-25, Lot: [id number] , Expiration date: 05/29/2019   Route: Intravitreal, Site: Right Eye, Waste: 0 mL  Post-op Post injection exam found visual acuity of at least counting fingers. The patient tolerated the procedure well. There were no complications. The patient received written and verbal post procedure care education.        Intravitreal Injection, Pharmacologic Agent - OS - Left Eye       Time Out 04/08/2019. 11:01 AM. Confirmed correct patient, procedure, site, and patient consented.   Anesthesia Topical anesthesia was used. Anesthetic medications  included Lidocaine 2%, Proparacaine 0.5%.   Procedure Preparation included  5% betadine to ocular surface, eyelid speculum. A supplied needle was used.   Injection:  1.25 mg Bevacizumab (AVASTIN) SOLN   NDC: 40981-191-47, Lot: [id number] , Expiration date: 06/04/2019   Route: Intravitreal, Site: Left Eye, Waste: 0 mL  Post-op Post injection exam found visual acuity of at least counting fingers. The patient tolerated the procedure well. There were no complications. The patient received written and verbal post procedure care education.                 ASSESSMENT/PLAN:    ICD-10-CM   1. Proliferative diabetic retinopathy of both eyes with macular edema associated with type 2 diabetes mellitus (HCC)  06/17/2019 Intravitreal Injection, Pharmacologic Agent - OD - Right Eye    Intravitreal Injection, Pharmacologic Agent - OS - Left Eye    Bevacizumab (AVASTIN) SOLN 1.25 mg    Bevacizumab (AVASTIN) SOLN 1.25 mg  2. Retinal edema  H35.81 OCT, Retina - OU - Both Eyes  3. Epiretinal membrane (ERM) of both eyes  H35.373   4. Essential hypertension  I10   5. Hypertensive retinopathy of both eyes  H35.033   6. Combined forms of age-related cataract of both eyes  H25.813   7. Dry eyes  H04.123     1,2. Proliferative diabetic retinopathy w/ DME, OU  - former pt of [phone] at [facility] and [facility] at [facility] -- known history of proliferative diabetic retinopathy  - history PPV OS and laser PRP OD with Dr. Seiling for PDR OU w/ TRD OS -- 01/25/17  - history of intravitreal anti-VEGF therapy with Dr. 02/07/17 -- last IVE ~01/2018  - s/p IVA OU #1 (02.13.20), #2 (03.12.20), #3 (05.10.20), #4 (06.08.20), #5 (07.06.20), #6 (08.03.20), #7 (08.31.20), #8 (09.28.20)  - s/p PRP fill in OD (02.27.20) -- good fill in laser in place  - exam shows regression of fine NVD OD, PRP laser in place OU  - FA (08.31.20) shows retinal NV vastly improved OD; significant vascular perfusion defects and  increased FAZ OU; late leaking MA OU  - OCT shows interval increase IRF OD and persistent IRF OS  - BCVA stable at 20/40 OU  - ?resistance to IVA -- discussed possible switch in therapy to IVE (was getting through 09.13.20 previously)  - recommend IVA OU #9 today, 10.26.20  - pt wishes to proceed  - RBA of procedure discussed, questions answered  - informed consent obtained and signed  - see procedure note  - [id number] Benefits Investigation initiated 10.26.2020  - f/u 4 weeks, DFE/OCT/likely injection OU  3. Epiretinal membrane, both eyes   - mild ERM  - asymptomatic, no metamorphopsia  - no indication for surgery at this time  - monitor for now  4,5. Hypertensive retinopathy OU  - discussed importance of tight BP control  - monitor  6. Combined form cataract OU   - The symptoms of cataract, surgical options, and treatments and risks were discussed with patient.  - discussed diagnosis and progression  - OS > OD, likely sequela from PPV OS  - approaching visual significance  - recommend referral to Dr. 11.08.2020 at [facility] for cat eval  - [facility] to make formal cataract referral  7. Dry eyes OU  - improving  - recommend artificial tears and lubricating ointment as needed    Ophthalmic Meds Ordered this visit:  Meds ordered this encounter  Medications  . Bevacizumab (AVASTIN) SOLN 1.25 mg  . Bevacizumab (AVASTIN) SOLN 1.25  mg       Return in about 4 weeks (around 05/06/2019) for f/u PDR OU, DFE, OCT.  There are no Patient Instructions on file for this visit.   Explained the diagnoses, plan, and follow up with the patient and they expressed understanding.  Patient expressed understanding of the importance of proper follow up care.   Electronically signed by: Leeann Must, COA    Gardiner Sleeper, M.D., Ph.D. Diseases & Surgery of the Retina and Vitreous [facility] [place]  I have reviewed the above documentation for accuracy and  completeness, and I agree with the above. Gardiner Sleeper, M.D., Ph.D. 04/08/19 5:49 PM    Abbreviations: M myopia (nearsighted); A astigmatism; H hyperopia (farsighted); P presbyopia; Mrx spectacle prescription;  CTL contact lenses; OD right eye; OS left eye; OU both eyes  XT exotropia; ET esotropia; PEK punctate epithelial keratitis; PEE punctate epithelial erosions; DES dry eye syndrome; MGD meibomian gland dysfunction; ATs artificial tears; PFAT's preservative free artificial tears; [place] nuclear sclerotic cataract; PSC posterior subcapsular cataract; ERM epi-retinal membrane; PVD posterior vitreous detachment; RD retinal detachment; DM diabetes mellitus; DR diabetic retinopathy; NPDR non-proliferative diabetic retinopathy; PDR proliferative diabetic retinopathy; CSME clinically significant macular edema; DME diabetic macular edema; dbh dot blot hemorrhages; CWS cotton wool spot; POAG primary open angle glaucoma; C/D cup-to-disc ratio; HVF humphrey visual field; GVF goldmann visual field; OCT optical coherence tomography; IOP intraocular pressure; BRVO Branch retinal vein occlusion; CRVO central retinal vein occlusion; CRAO central retinal artery occlusion; BRAO branch retinal artery occlusion; RT retinal tear; SB scleral buckle; PPV pars plana vitrectomy; VH Vitreous hemorrhage; PRP panretinal laser photocoagulation; IVK intravitreal kenalog; VMT vitreomacular traction; MH Macular hole;  NVD neovascularization of the disc; NVE neovascularization elsewhere; AREDS age related eye disease study; ARMD age related macular degeneration; POAG primary open angle glaucoma; EBMD epithelial/anterior basement membrane dystrophy; ACIOL anterior chamber intraocular lens; IOL intraocular lens; PCIOL posterior chamber intraocular lens; Phaco/IOL phacoemulsification with intraocular lens placement; [place] photorefractive keratectomy; LASIK laser assisted in situ keratomileusis; HTN hypertension; DM diabetes mellitus; COPD  chronic obstructive pulmonary disease

## 2019-04-30 NOTE — Progress Notes (Signed)
[facility] Note  05/06/2019     CHIEF COMPLAINT Patient presents for Retina Follow Up   HISTORY OF PRESENT ILLNESS: John Jacobson is a 63 y.o. male who presents to the clinic today for:   HPI    Retina Follow Up    Patient presents with  Diabetic Retinopathy.  Severity is moderate.  Duration of 4 weeks.  I, the attending physician,  performed the HPI with the patient and updated documentation appropriately.          Comments    Patient states vision the same OU. BS hasn't checked today. Last a1c was 7.8, checked 2 weeks ago.        Last edited by Bernarda Caffey, MD on 05/06/2019 10:00 AM. (History)    pt states he feels like his left eye vision is worse today   Referring physician: Angelica Pou, MD [place],  [place] [address]  HISTORICAL INFORMATION:   Selected notes from the MEDICAL RECORD NUMBER Referred by Dr. Dorian Pod [facility]) for DM exam LEE:  Ocular Hx- PMH-    CURRENT MEDICATIONS: Current Outpatient Medications (Ophthalmic Drugs)  Medication Sig  . carboxymethylcellulose 1 % ophthalmic solution Place 1 drop into both eyes 4 (four) times daily.   No current facility-administered medications for this visit.  (Ophthalmic Drugs)   Current Outpatient Medications (Other)  Medication Sig  . acetaminophen (TYLENOL) 500 MG tablet Take 500 mg by mouth 2 (two) times daily as needed (for pain or headaches).  Marland Kitchen amoxicillin-clavulanate (AUGMENTIN) 875-125 MG tablet Take 1 tablet by mouth every 12 (twelve) hours.  Marland Kitchen aspirin EC 325 MG EC tablet Take 1 tablet (325 mg total) by mouth daily.  Marland Kitchen atorvastatin (LIPITOR) 20 MG tablet Take 20 mg by mouth daily.  . carvedilol (COREG) 25 MG tablet Take 12.5 mg by mouth every 12 (twelve) hours.  . Cholecalciferol (VITAMIN D3) 2000 units TABS Take 4,000 Units by mouth daily.  . cloNIDine (CATAPRES) 0.3 MG tablet Take 1 tablet (0.3 mg total) by mouth 3 (three) times  daily.  . clopidogrel (PLAVIX) 75 MG tablet Take 75 mg by mouth daily.  . Cyanocobalamin (VITAMIN B-12 PO) Take 1 tablet by mouth daily.  Marland Kitchen doxazosin (CARDURA) 8 MG tablet Take 4 mg by mouth 2 (two) times daily.  . ferrous sulfate 325 (65 FE) MG tablet Take 325 mg by mouth every Monday, Wednesday, and Friday.  . furosemide (LASIX) 40 MG tablet Take 0.5 tablets (20 mg total) by mouth daily.  Marland Kitchen glucose 4 GM chewable tablet Chew 1 tablet by mouth daily as needed for low blood sugar.  . hydrALAZINE (APRESOLINE) 100 MG tablet Take 1 tablet (100 mg total) by mouth 3 (three) times daily.  Marland Kitchen HYDROcodone-acetaminophen (NORCO/VICODIN) 5-325 MG tablet Take 1 tablet by mouth every 6 (six) hours as needed.  . insulin glargine (LANTUS) 100 unit/mL SOPN Inject 0.3 mLs (30 Units total) into the skin at bedtime.  . montelukast (SINGULAIR) 10 MG tablet Take 10 mg by mouth at bedtime.  . polyethylene glycol (MIRALAX / GLYCOLAX) packet Take 17 g by mouth daily. (Patient taking differently: Take 17 g by mouth daily as needed for mild constipation. )  . potassium chloride (K-DUR) 10 MEQ tablet Take 1 tablet (10 mEq total) by mouth daily.  Marland Kitchen senna (SENOKOT) 8.6 MG TABS tablet Take 1 tablet (8.6 mg total) by mouth daily. (Patient taking differently: Take 1 tablet by  mouth daily as needed for mild constipation. )  . Skin Protectants, Misc. (EUCERIN) cream Apply 1 application topically See admin instructions. Apply to both feet daily after showering  . isosorbide mononitrate (IMDUR) 60 MG 24 hr tablet Take 1 tablet (60 mg total) by mouth daily.   Current Facility-Administered Medications (Other)  Medication Route  . Bevacizumab (AVASTIN) SOLN 1.25 mg Intravitreal  . Bevacizumab (AVASTIN) SOLN 1.25 mg Intravitreal  . Bevacizumab (AVASTIN) SOLN 1.25 mg Intravitreal  . Bevacizumab (AVASTIN) SOLN 1.25 mg Intravitreal      REVIEW OF SYSTEMS: ROS    Positive for: Endocrine, Cardiovascular, Eyes, Respiratory   Negative  for: Constitutional, Gastrointestinal, Neurological, Skin, Genitourinary, Musculoskeletal, HENT, Psychiatric, Allergic/Imm, Heme/Lymph   Last edited by Roselee Nova D, COT on 05/06/2019  9:14 AM. (History)       ALLERGIES Allergies  Allergen Reactions  . Amlodipine Besy-Benazepril Hcl Anaphylaxis, Shortness Of Breath and Swelling    Mouth and tongue swelling  . Shellfish Allergy Anaphylaxis, Shortness Of Breath and Swelling    PAST MEDICAL HISTORY Past Medical History:  Diagnosis Date  . Asthma   . Back pain   . CHF (congestive heart failure) ([place])   . Chronic kidney disease   . DM (diabetes mellitus) ([place])   . Hepatitis C   . HTN (hypertension)   . RBBB (right bundle branch block)   . Stroke [facility]) 2018   Past Surgical History:  Procedure Laterality Date  . BACK SURGERY  1995  . PARS PLANA VITRECTOMY Left 01/25/2017  . TOE AMPUTATION  2016    FAMILY HISTORY Family History  Problem Relation Age of Onset  . Coronary artery disease Mother        MI in her 50s  . Diabetes Mother   . Macular degeneration Mother   . Hypertension Other   . Diabetes Other   . Alzheimer's disease Other   . Coronary artery disease Brother   . Diabetes Brother   . Diabetes Sister     SOCIAL HISTORY Social History   Tobacco Use  . Smoking status: Former Smoker    Quit date: 01/07/2018    Years since quitting: 1.3  . Smokeless tobacco: Never Used  Substance Use Topics  . Alcohol use: No    Comment: Few beers every other day hx  . Drug use: No         OPHTHALMIC EXAM:  Base Eye Exam    Visual Acuity (Snellen - Linear)      Right Left   Dist [place] 20/50 -1 20/60   Dist ph [place] 20/40 20/40       Tonometry (Tonopen, 9:22 AM)      Right Left   Pressure 14 13       Pupils      Dark Light Shape React APD   Right 3 2 Round Slow None   Left 3 2 Round Slow None       Visual Fields (Counting fingers)      Left Right    Full Full       Extraocular Movement      Right Left     Full, Ortho Full, Ortho       Neuro/Psych    Oriented x3: Yes   Mood/Affect: Normal       Dilation    Both eyes: 1.0% Mydriacyl, 2.5% Phenylephrine @ 9:22 AM        Slit Lamp and Fundus Exam    Slit Lamp Exam  Right Left   Lids/Lashes Dermatochalasis - upper lid, mild Meibomian gland dysfunction Dermatochalasis - upper lid, mild Meibomian gland dysfunction   Conjunctiva/Sclera nasal Pinguecula, Melanosis nasal/temporal Pinguecula, Melanosis   Cornea 1+ Punctate epithelial erosions Mild Debris in tear film, 1+ Punctate epithelial erosions nasal and inferior   Anterior Chamber deep, narrow temporal angle deep, narrow temporal angle   Iris Round and dilated, No NVI Round and dilated, No NVI   Lens 2-3+ Nuclear sclerosis, 2-3+ Cortical cataract 3+ Nuclear sclerosis, 2-3+ Cortical cataract, 1+ Posterior subcapsular cataract   Vitreous Vitreous syneresis post vitrectomy       Fundus Exam      Right Left   Disc mild pallor, sharp rim mild pallor, sharp rim   C/D Ratio 0.4 0.5   Macula Blunted foveal reflex, scattered MA, +Epiretinal membrane, +edema temporal macula - mild improvement, scattered exudates - persistent Flat, blunted foveal reflex, scattered Microaneurysms, scattered IRH - cluster just temporal to fovea, temporal macula very ischemic, interval increase in nasal edema   Vessels severe Vascular attenuation, sclerotic arterioles, +AV crossing changes, Copper wiring Vascular attenuation, mild Tortuousity   Periphery Attached, 360 PRP, good posterior laser fill in, scattered IRH Attached, good 360 PRP in place          IMAGING AND PROCEDURES  Imaging and Procedures for @TODAY @  OCT, Retina - OU - Both Eyes       Right Eye Quality was good. Central Foveal Thickness: 239. Progression has been stable. Findings include abnormal foveal contour, epiretinal membrane, no SRF, outer retinal atrophy, intraretinal fluid, macular pucker, intraretinal hyper-reflective  material (Persistent IRF ST macula).   Left Eye Quality was good. Central Foveal Thickness: 298. Progression has worsened. Findings include abnormal foveal contour, intraretinal fluid, no SRF, epiretinal membrane, outer retinal atrophy, inner retinal atrophy (Interval increase in nasal IRF; mild interval improvement in temporal IRF).   Notes *Images captured and stored on drive  Diagnosis / Impression:  OU: ERM, diffuse atrophy, +DME OD: Persistent IRF ST macula OS: Interval increase in nasal IRF; mild interval improvement in temporal IRF  Clinical management:  See below  Abbreviations: NFP - Normal foveal profile. CME - cystoid macular edema. PED - pigment epithelial detachment. IRF - intraretinal fluid. SRF - subretinal fluid. EZ - ellipsoid zone. ERM - epiretinal membrane. ORA - outer retinal atrophy. ORT - outer retinal tubulation. SRHM - subretinal hyper-reflective material        Intravitreal Injection, Pharmacologic Agent - OD - Right Eye       Time Out 05/06/2019. 9:10 AM. Confirmed correct patient, procedure, site, and patient consented.   Anesthesia Topical anesthesia was used. Anesthetic medications included Lidocaine 2%, Proparacaine 0.5%.   Procedure Preparation included 5% betadine to ocular surface, eyelid speculum. A 30 gauge needle was used.   Injection:  2 mg aflibercept Alfonse Flavors) SOLN   NDC: A3590391, Lot: [id number], Expiration date: 10/08/2019   Route: Intravitreal, Site: Right Eye, Waste: 0 mL  Post-op Post injection exam found visual acuity of at least counting fingers. The patient tolerated the procedure well. There were no complications. The patient received written and verbal post procedure care education.        Intravitreal Injection, Pharmacologic Agent - OS - Left Eye       Time Out 05/06/2019. 9:11 AM. Confirmed correct patient, procedure, site, and patient consented.   Anesthesia Topical anesthesia was used. Anesthetic medications  included Lidocaine 2%, Proparacaine 0.5%.   Procedure Preparation included 5% betadine to ocular surface,  eyelid speculum. A 30 gauge needle was used.   Injection:  2 mg aflibercept Alfonse Flavors) SOLN   NDC: A3590391, Lot: [id number], Expiration date: 11/07/2019   Route: Intravitreal, Site: Left Eye, Waste: 0.05 mL  Post-op Post injection exam found visual acuity of at least counting fingers. The patient tolerated the procedure well. There were no complications. The patient received written and verbal post procedure care education.                 ASSESSMENT/PLAN:    ICD-10-CM   1. Proliferative diabetic retinopathy of both eyes with macular edema associated with type 2 diabetes mellitus (HCC)  C16.3845 Intravitreal Injection, Pharmacologic Agent - OD - Right Eye    Intravitreal Injection, Pharmacologic Agent - OS - Left Eye    aflibercept (EYLEA) SOLN 2 mg    aflibercept (EYLEA) SOLN 2 mg  2. Retinal edema  H35.81 OCT, Retina - OU - Both Eyes  3. Epiretinal membrane (ERM) of both eyes  H35.373   4. Essential hypertension  I10   5. Hypertensive retinopathy of both eyes  H35.033   6. Combined forms of age-related cataract of both eyes  H25.813   7. Dry eyes  H04.123     1,2. Proliferative diabetic retinopathy w/ DME, OU  - former pt of Dwana Melena at [facility] and Adonis Brook at [facility] -- known history of proliferative diabetic retinopathy  - history PPV OS and laser PRP OD with Dr. Manuella Ghazi for PDR OU w/ TRD OS -- 01/25/17  - history of intravitreal anti-VEGF therapy with Dr. Anderson Malta -- last IVE ~01/2018  - s/p IVA OU #1 (02.13.20), #2 (03.12.20), #3 (05.10.20), #4 (06.08.20), #5 (07.06.20), #6 (08.03.20), #7 (08.31.20), #8 (09.28.20), #9 (10.26.20)  - s/p PRP fill in OD (02.27.20) -- good fill in laser in place  - exam shows regression of fine NVD OD, PRP laser in place OU  - FA (08.31.20) shows retinal NV vastly improved OD; significant vascular perfusion defects and  increased FAZ OU; late leaking MA OU  - OCT shows persistent IRF OD; OS w/ mild interval increase in nasal IRF; mild interval improvement in temporal IRF  - BCVA stable at 20/40 OU  - ?resistance to IVA -- will switch to [facility] today  - recommend IVE OU #1 today, 11.23.20  - pt wishes to proceed  - RBA of procedure discussed, questions answered  - informed consent obtained and signed  - see procedure note  - Eylea4U Benefits Investigation initiated 10.26.2020 -- approved as of 11.23.20  - f/u 4 weeks, DFE/OCT/likely injection OU  3. Epiretinal membrane, both eyes   - mild ERM  - asymptomatic, no metamorphopsia  - no indication for surgery at this time  - monitor for now  4,5. Hypertensive retinopathy OU  - discussed importance of tight BP control  - monitor  6. Combined form cataract OU   - The symptoms of cataract, surgical options, and treatments and risks were discussed with patient.  - discussed diagnosis and progression  - OS > OD, likely sequela from PPV OS  - approaching visual significance  - recommend referral to Dr. Kathlen Mody at [facility] for cat eval  - [facility] to make formal cataract referral  7. Dry eyes OU  - improving  - recommend artificial tears and lubricating ointment as needed    Ophthalmic Meds Ordered this visit:  Meds ordered this encounter  Medications  . aflibercept (EYLEA) SOLN 2 mg  .  aflibercept (EYLEA) SOLN 2 mg       Return in about 4 weeks (around 06/03/2019) for f/u PDR OU, DFE, OCT.  There are no Patient Instructions on file for this visit.   Explained the diagnoses, plan, and follow up with the patient and they expressed understanding.  Patient expressed understanding of the importance of proper follow up care.   Electronically signed by: Leeann Must, COA   This document serves as a record of services personally performed by Gardiner Sleeper, MD, PhD. It was created on their behalf by Ernest Mallick, OA, an ophthalmic  assistant. The creation of this record is the provider's dictation and/or activities during the visit.    Electronically signed by: Ernest Mallick, OA 11.23.2020 1:10 PM    Gardiner Sleeper, M.D., Ph.D. Diseases & Surgery of the Retina and Vitreous [facility]  I have reviewed the above documentation for accuracy and completeness, and I agree with the above. Gardiner Sleeper, M.D., Ph.D. 05/06/19 1:10 PM    Abbreviations: M myopia (nearsighted); A astigmatism; H hyperopia (farsighted); P presbyopia; Mrx spectacle prescription;  CTL contact lenses; OD right eye; OS left eye; OU both eyes  XT exotropia; ET esotropia; PEK punctate epithelial keratitis; PEE punctate epithelial erosions; DES dry eye syndrome; MGD meibomian gland dysfunction; ATs artificial tears; PFAT's preservative free artificial tears; [place] nuclear sclerotic cataract; PSC posterior subcapsular cataract; ERM epi-retinal membrane; PVD posterior vitreous detachment; RD retinal detachment; DM diabetes mellitus; DR diabetic retinopathy; NPDR non-proliferative diabetic retinopathy; PDR proliferative diabetic retinopathy; CSME clinically significant macular edema; DME diabetic macular edema; dbh dot blot hemorrhages; CWS cotton wool spot; POAG primary open angle glaucoma; C/D cup-to-disc ratio; HVF humphrey visual field; GVF goldmann visual field; OCT optical coherence tomography; IOP intraocular pressure; BRVO Branch retinal vein occlusion; CRVO central retinal vein occlusion; CRAO central retinal artery occlusion; BRAO branch retinal artery occlusion; RT retinal tear; SB scleral buckle; PPV pars plana vitrectomy; VH Vitreous hemorrhage; PRP panretinal laser photocoagulation; IVK intravitreal kenalog; VMT vitreomacular traction; MH Macular hole;  NVD neovascularization of the disc; NVE neovascularization elsewhere; AREDS age related eye disease study; ARMD age related macular degeneration; POAG primary open angle glaucoma;  EBMD epithelial/anterior basement membrane dystrophy; ACIOL anterior chamber intraocular lens; IOL intraocular lens; PCIOL posterior chamber intraocular lens; Phaco/IOL phacoemulsification with intraocular lens placement; [place] photorefractive keratectomy; LASIK laser assisted in situ keratomileusis; HTN hypertension; DM diabetes mellitus; COPD chronic obstructive pulmonary disease

## 2019-05-06 ENCOUNTER — Encounter (INDEPENDENT_AMBULATORY_CARE_PROVIDER_SITE_OTHER): Payer: Self-pay | Admitting: Ophthalmology

## 2019-05-06 ENCOUNTER — Ambulatory Visit (INDEPENDENT_AMBULATORY_CARE_PROVIDER_SITE_OTHER): Payer: Medicare (Managed Care) | Admitting: Ophthalmology

## 2019-05-06 DIAGNOSIS — I10 Essential (primary) hypertension: Secondary | ICD-10-CM

## 2019-05-06 DIAGNOSIS — H35033 Hypertensive retinopathy, bilateral: Secondary | ICD-10-CM

## 2019-05-06 DIAGNOSIS — H35373 Puckering of macula, bilateral: Secondary | ICD-10-CM

## 2019-05-06 DIAGNOSIS — H3581 Retinal edema: Secondary | ICD-10-CM | POA: Diagnosis not present

## 2019-05-06 DIAGNOSIS — H25813 Combined forms of age-related cataract, bilateral: Secondary | ICD-10-CM

## 2019-05-06 DIAGNOSIS — E113513 Type 2 diabetes mellitus with proliferative diabetic retinopathy with macular edema, bilateral: Secondary | ICD-10-CM | POA: Diagnosis not present

## 2019-05-06 DIAGNOSIS — H04123 Dry eye syndrome of bilateral lacrimal glands: Secondary | ICD-10-CM

## 2019-05-06 MED ORDER — AFLIBERCEPT 2MG/0.05ML IZ SOLN FOR KALEIDOSCOPE
2.0000 mg | INTRAVITREAL | Status: AC | PRN
  Administered 2019-05-06: 2 mg via INTRAVITREAL

## 2019-05-06 MED ORDER — AFLIBERCEPT 2MG/0.05ML IZ SOLN FOR KALEIDOSCOPE
2.0000 mg | INTRAVITREAL | Status: AC | PRN
  Administered 2019-05-06: 13:00:00 2 mg via INTRAVITREAL

## 2019-06-03 ENCOUNTER — Encounter (INDEPENDENT_AMBULATORY_CARE_PROVIDER_SITE_OTHER): Payer: Medicare (Managed Care) | Admitting: Ophthalmology

## 2019-06-03 DIAGNOSIS — I10 Essential (primary) hypertension: Secondary | ICD-10-CM

## 2019-06-03 DIAGNOSIS — H35033 Hypertensive retinopathy, bilateral: Secondary | ICD-10-CM

## 2019-06-03 DIAGNOSIS — E113513 Type 2 diabetes mellitus with proliferative diabetic retinopathy with macular edema, bilateral: Secondary | ICD-10-CM

## 2019-06-03 DIAGNOSIS — H04123 Dry eye syndrome of bilateral lacrimal glands: Secondary | ICD-10-CM

## 2019-06-03 DIAGNOSIS — H3581 Retinal edema: Secondary | ICD-10-CM

## 2019-06-03 DIAGNOSIS — H35373 Puckering of macula, bilateral: Secondary | ICD-10-CM

## 2019-06-03 DIAGNOSIS — H25813 Combined forms of age-related cataract, bilateral: Secondary | ICD-10-CM

## 2019-06-05 ENCOUNTER — Encounter (INDEPENDENT_AMBULATORY_CARE_PROVIDER_SITE_OTHER): Payer: Medicare (Managed Care) | Admitting: Ophthalmology

## 2019-07-01 NOTE — Progress Notes (Signed)
This encounter was created in error - please disregard.   [facility] Note  07/03/2019     CHIEF COMPLAINT Patient presents for No chief complaint on file.   HISTORY OF PRESENT ILLNESS: John Jacobson is a 64 y.o. male who presents to the clinic today for:   pt states he feels like his left eye vision is worse today   Referring physician: Angelica Pou, MD [place],  [place] [address]  HISTORICAL INFORMATION:   Selected notes from the MEDICAL RECORD NUMBER Referred by Dr. Dorian Pod [facility]) for DM exam LEE:  Ocular Hx- PMH-    CURRENT MEDICATIONS: Current Outpatient Medications (Ophthalmic Drugs)  Medication Sig  . carboxymethylcellulose 1 % ophthalmic solution Place 1 drop into both eyes 4 (four) times daily.   No current facility-administered medications for this visit. (Ophthalmic Drugs)   Current Outpatient Medications (Other)  Medication Sig  . acetaminophen (TYLENOL) 500 MG tablet Take 500 mg by mouth 2 (two) times daily as needed (for pain or headaches).  Marland Kitchen amoxicillin-clavulanate (AUGMENTIN) 875-125 MG tablet Take 1 tablet by mouth every 12 (twelve) hours.  Marland Kitchen aspirin EC 325 MG EC tablet Take 1 tablet (325 mg total) by mouth daily.  Marland Kitchen atorvastatin (LIPITOR) 20 MG tablet Take 20 mg by mouth daily.  . carvedilol (COREG) 25 MG tablet Take 12.5 mg by mouth every 12 (twelve) hours.  . Cholecalciferol (VITAMIN D3) 2000 units TABS Take 4,000 Units by mouth daily.  . cloNIDine (CATAPRES) 0.3 MG tablet Take 1 tablet (0.3 mg total) by mouth 3 (three) times daily.  . clopidogrel (PLAVIX) 75 MG tablet Take 75 mg by mouth daily.  . Cyanocobalamin (VITAMIN B-12 PO) Take 1 tablet by mouth daily.  Marland Kitchen doxazosin (CARDURA) 8 MG tablet Take 4 mg by mouth 2 (two) times daily.  . ferrous sulfate 325 (65 FE) MG tablet Take 325 mg by mouth every Monday, Wednesday, and Friday.  . furosemide (LASIX) 40 MG tablet Take 0.5  tablets (20 mg total) by mouth daily.  Marland Kitchen glucose 4 GM chewable tablet Chew 1 tablet by mouth daily as needed for low blood sugar.  . hydrALAZINE (APRESOLINE) 100 MG tablet Take 1 tablet (100 mg total) by mouth 3 (three) times daily.  Marland Kitchen HYDROcodone-acetaminophen (NORCO/VICODIN) 5-325 MG tablet Take 1 tablet by mouth every 6 (six) hours as needed.  . insulin glargine (LANTUS) 100 unit/mL SOPN Inject 0.3 mLs (30 Units total) into the skin at bedtime.  . isosorbide mononitrate (IMDUR) 60 MG 24 hr tablet Take 1 tablet (60 mg total) by mouth daily.  . montelukast (SINGULAIR) 10 MG tablet Take 10 mg by mouth at bedtime.  . polyethylene glycol (MIRALAX / GLYCOLAX) packet Take 17 g by mouth daily. (Patient taking differently: Take 17 g by mouth daily as needed for mild constipation. )  . potassium chloride (K-DUR) 10 MEQ tablet Take 1 tablet (10 mEq total) by mouth daily.  Marland Kitchen senna (SENOKOT) 8.6 MG TABS tablet Take 1 tablet (8.6 mg total) by mouth daily. (Patient taking differently: Take 1 tablet by mouth daily as needed for mild constipation. )  . Skin Protectants, Misc. (EUCERIN) cream Apply 1 application topically See admin instructions. Apply to both feet daily after showering   Current Facility-Administered Medications (Other)  Medication Route  . Bevacizumab (AVASTIN) SOLN 1.25 mg Intravitreal  . Bevacizumab (AVASTIN) SOLN 1.25 mg Intravitreal  . Bevacizumab (AVASTIN) SOLN 1.25 mg Intravitreal  .  Bevacizumab (AVASTIN) SOLN 1.25 mg Intravitreal      REVIEW OF SYSTEMS:    ALLERGIES Allergies  Allergen Reactions  . Amlodipine Besy-Benazepril Hcl Anaphylaxis, Shortness Of Breath and Swelling    Mouth and tongue swelling  . Shellfish Allergy Anaphylaxis, Shortness Of Breath and Swelling    PAST MEDICAL HISTORY Past Medical History:  Diagnosis Date  . Asthma   . Back pain   . CHF (congestive heart failure) ([place])   . Chronic kidney disease   . DM (diabetes mellitus) ([place])   . Hepatitis  C   . HTN (hypertension)   . RBBB (right bundle branch block)   . Stroke [facility]) 2018   Past Surgical History:  Procedure Laterality Date  . BACK SURGERY  1995  . PARS PLANA VITRECTOMY Left 01/25/2017  . TOE AMPUTATION  2016    FAMILY HISTORY Family History  Problem Relation Age of Onset  . Coronary artery disease Mother        MI in her 36s  . Diabetes Mother   . Macular degeneration Mother   . Hypertension Other   . Diabetes Other   . Alzheimer's disease Other   . Coronary artery disease Brother   . Diabetes Brother   . Diabetes Sister     SOCIAL HISTORY Social History   Tobacco Use  . Smoking status: Former Smoker    Quit date: 01/07/2018    Years since quitting: 1.4  . Smokeless tobacco: Never Used  Substance Use Topics  . Alcohol use: No    Comment: Few beers every other day hx  . Drug use: No         OPHTHALMIC EXAM:   Not recorded      IMAGING AND PROCEDURES  Imaging and Procedures for @TODAY @           ASSESSMENT/PLAN:    ICD-10-CM   1. Proliferative diabetic retinopathy of both eyes with macular edema associated with type 2 diabetes mellitus ([place])  Z61.0960   2. Retinal edema  H35.81   3. Epiretinal membrane (ERM) of both eyes  H35.373   4. Essential hypertension  I10   5. Hypertensive retinopathy of both eyes  H35.033   6. Combined forms of age-related cataract of both eyes  H25.813   7. Dry eyes  H04.123     1,2. Proliferative diabetic retinopathy w/ DME, OU  - former pt of Dwana Melena at [facility] and Adonis Brook at [place] -- known history of proliferative diabetic retinopathy  - history PPV OS and laser PRP OD with Dr. Manuella Ghazi for PDR OU w/ TRD OS -- 01/25/17  - history of intravitreal anti-VEGF therapy with Dr. Anderson Malta -- last IVE ~01/2018  - s/p IVA OU #1 (02.13.20), #2 (03.12.20), #3 (05.10.20), #4 (06.08.20), #5 (07.06.20), #6 (08.03.20), #7 (08.31.20), #8 (09.28.20), #9 (10.26.20)  - s/p IVE OU# 1 (11.23.20)  - s/p PRP  fill in OD (02.27.20) -- good fill in laser in place  - exam shows regression of fine NVD OD, PRP laser in place OU  - FA (08.31.20) shows retinal NV vastly improved OD; significant vascular perfusion defects and increased FAZ OU; late leaking MA OU  - OCT shows persistent IRF OD; OS w/ mild interval increase in nasal IRF; mild interval improvement in temporal IRF  - BCVA stable at 20/40 OU  - ?resistance to IVA -- switched to [facility] on 11.23.20  - recommend IVE OU #2 today, 01.20.21  - pt wishes to proceed  -  RBA of procedure discussed, questions answered  - informed consent obtained and signed  - see procedure note  - [id number] Benefits Investigation initiated 10.26.2020 -- approved as of 11.23.20  - f/u 4 weeks, DFE/OCT/likely injection OU  3. Epiretinal membrane, both eyes   - mild ERM  - asymptomatic, no metamorphopsia  - no indication for surgery at this time  - monitor for now  4,5. Hypertensive retinopathy OU  - discussed importance of tight BP control  - monitor  6. Combined form cataract OU   - The symptoms of cataract, surgical options, and treatments and risks were discussed with patient.  - discussed diagnosis and progression  - OS > OD, likely sequela from PPV OS  - approaching visual significance  - recommend referral to [facility]. Kathlen Mody at [facility] for cat eval  - [facility] to make formal cataract referral  7. Dry eyes OU  - improving  - recommend artificial tears and lubricating ointment as needed    Ophthalmic Meds Ordered this visit:  No orders of the defined types were placed in this encounter.      No follow-ups on file.  There are no Patient Instructions on file for this visit.     Abbreviations: M myopia (nearsighted); A astigmatism; H hyperopia (farsighted); P presbyopia; Mrx spectacle prescription;  CTL contact lenses; OD right eye; OS left eye; OU both eyes  XT exotropia; ET esotropia; PEK punctate epithelial keratitis; PEE punctate  epithelial erosions; DES dry eye syndrome; MGD meibomian gland dysfunction; ATs artificial tears; PFAT's preservative free artificial tears; [place] nuclear sclerotic cataract; PSC posterior subcapsular cataract; ERM epi-retinal membrane; PVD posterior vitreous detachment; RD retinal detachment; DM diabetes mellitus; DR diabetic retinopathy; NPDR non-proliferative diabetic retinopathy; PDR proliferative diabetic retinopathy; CSME clinically significant macular edema; DME diabetic macular edema; dbh dot blot hemorrhages; CWS cotton wool spot; POAG primary open angle glaucoma; C/D cup-to-disc ratio; HVF humphrey visual field; GVF goldmann visual field; OCT optical coherence tomography; IOP intraocular pressure; BRVO Branch retinal vein occlusion; CRVO central retinal vein occlusion; CRAO central retinal artery occlusion; BRAO branch retinal artery occlusion; RT retinal tear; SB scleral buckle; PPV pars plana vitrectomy; VH Vitreous hemorrhage; PRP panretinal laser photocoagulation; IVK intravitreal kenalog; VMT vitreomacular traction; MH Macular hole;  NVD neovascularization of the disc; NVE neovascularization elsewhere; AREDS age related eye disease study; ARMD age related macular degeneration; POAG primary open angle glaucoma; EBMD epithelial/anterior basement membrane dystrophy; ACIOL anterior chamber intraocular lens; IOL intraocular lens; PCIOL posterior chamber intraocular lens; Phaco/IOL phacoemulsification with intraocular lens placement; [place] photorefractive keratectomy; LASIK laser assisted in situ keratomileusis; HTN hypertension; DM diabetes mellitus; COPD chronic obstructive pulmonary disease  This encounter was created in error - please disregard.

## 2019-07-03 ENCOUNTER — Encounter (INDEPENDENT_AMBULATORY_CARE_PROVIDER_SITE_OTHER): Payer: Medicare (Managed Care) | Admitting: Ophthalmology

## 2019-07-03 DIAGNOSIS — H3581 Retinal edema: Secondary | ICD-10-CM

## 2019-07-03 DIAGNOSIS — I10 Essential (primary) hypertension: Secondary | ICD-10-CM

## 2019-07-03 DIAGNOSIS — H25813 Combined forms of age-related cataract, bilateral: Secondary | ICD-10-CM

## 2019-07-03 DIAGNOSIS — H35373 Puckering of macula, bilateral: Secondary | ICD-10-CM

## 2019-07-03 DIAGNOSIS — H04123 Dry eye syndrome of bilateral lacrimal glands: Secondary | ICD-10-CM

## 2019-07-03 DIAGNOSIS — H35033 Hypertensive retinopathy, bilateral: Secondary | ICD-10-CM

## 2019-07-03 DIAGNOSIS — E113513 Type 2 diabetes mellitus with proliferative diabetic retinopathy with macular edema, bilateral: Secondary | ICD-10-CM

## 2019-07-05 ENCOUNTER — Encounter (INDEPENDENT_AMBULATORY_CARE_PROVIDER_SITE_OTHER): Payer: Self-pay | Admitting: Ophthalmology

## 2019-07-05 ENCOUNTER — Encounter (INDEPENDENT_AMBULATORY_CARE_PROVIDER_SITE_OTHER): Payer: Medicare (Managed Care) | Admitting: Ophthalmology

## 2019-07-05 ENCOUNTER — Ambulatory Visit (INDEPENDENT_AMBULATORY_CARE_PROVIDER_SITE_OTHER): Payer: Medicare (Managed Care) | Admitting: Ophthalmology

## 2019-07-05 DIAGNOSIS — H25813 Combined forms of age-related cataract, bilateral: Secondary | ICD-10-CM

## 2019-07-05 DIAGNOSIS — H3581 Retinal edema: Secondary | ICD-10-CM

## 2019-07-05 DIAGNOSIS — I10 Essential (primary) hypertension: Secondary | ICD-10-CM | POA: Diagnosis not present

## 2019-07-05 DIAGNOSIS — H35373 Puckering of macula, bilateral: Secondary | ICD-10-CM | POA: Diagnosis not present

## 2019-07-05 DIAGNOSIS — E113513 Type 2 diabetes mellitus with proliferative diabetic retinopathy with macular edema, bilateral: Secondary | ICD-10-CM | POA: Diagnosis not present

## 2019-07-05 DIAGNOSIS — H35033 Hypertensive retinopathy, bilateral: Secondary | ICD-10-CM

## 2019-07-05 DIAGNOSIS — H04123 Dry eye syndrome of bilateral lacrimal glands: Secondary | ICD-10-CM

## 2019-07-05 MED ORDER — AFLIBERCEPT 2MG/0.05ML IZ SOLN FOR KALEIDOSCOPE
2.0000 mg | INTRAVITREAL | Status: AC | PRN
  Administered 2019-07-05: 2 mg via INTRAVITREAL

## 2019-07-05 NOTE — Progress Notes (Signed)
[facility] Note  07/05/2019     CHIEF COMPLAINT Patient presents for Retina Follow Up   HISTORY OF PRESENT ILLNESS: John Jacobson is a 64 y.o. male who presents to the clinic today for:   HPI    Retina Follow Up    Patient presents with  Diabetic Retinopathy.  In both eyes.  This started 4 weeks ago.  Severity is moderate.  I, the attending physician,  performed the HPI with the patient and updated documentation appropriately.          Comments    Patient here for 4 weeks retina follow up for PDR OU. Patient states vision a little cloudy today. No eye pain.       Last edited by Bernarda Caffey, MD on 07/05/2019  1:07 PM. (History)    patient had delayed follow-up at 8 weeks instead of 4 weeks. Patient states vision a little cloudy today.   Referring physician: Angelica Pou, [place],   [id number]  HISTORICAL INFORMATION:   Selected notes from the MEDICAL RECORD NUMBER Referred by Dr. Dorian Pod [facility]) for DM exam LEE:  Ocular Hx- PMH-    CURRENT MEDICATIONS: Current Outpatient Medications (Ophthalmic Drugs)  Medication Sig  . carboxymethylcellulose 1 % ophthalmic solution Place 1 drop into both eyes 4 (four) times daily.   No current facility-administered medications for this visit. (Ophthalmic Drugs)   Current Outpatient Medications (Other)  Medication Sig  . acetaminophen (TYLENOL) 500 MG tablet Take 500 mg by mouth 2 (two) times daily as needed (for pain or headaches).  Marland Kitchen amoxicillin-clavulanate (AUGMENTIN) 875-125 MG tablet Take 1 tablet by mouth every 12 (twelve) hours.  Marland Kitchen aspirin EC 325 MG EC tablet Take 1 tablet (325 mg total) by mouth daily.  Marland Kitchen atorvastatin (LIPITOR) 20 MG tablet Take 20 mg by mouth daily.  . carvedilol (COREG) 25 MG tablet Take 12.5 mg by mouth every 12 (twelve) hours.  . Cholecalciferol (VITAMIN D3) 2000 units TABS Take 4,000 Units by mouth daily.  . cloNIDine  (CATAPRES) 0.3 MG tablet Take 1 tablet (0.3 mg total) by mouth 3 (three) times daily.  . clopidogrel (PLAVIX) 75 MG tablet Take 75 mg by mouth daily.  . Cyanocobalamin (VITAMIN B-12 PO) Take 1 tablet by mouth daily.  Marland Kitchen doxazosin (CARDURA) 8 MG tablet Take 4 mg by mouth 2 (two) times daily.  . ferrous sulfate 325 (65 FE) MG tablet Take 325 mg by mouth every Monday, Wednesday, and Friday.  . furosemide (LASIX) 40 MG tablet Take 0.5 tablets (20 mg total) by mouth daily.  Marland Kitchen glucose 4 GM chewable tablet Chew 1 tablet by mouth daily as needed for low blood sugar.  . hydrALAZINE (APRESOLINE) 100 MG tablet Take 1 tablet (100 mg total) by mouth 3 (three) times daily.  Marland Kitchen HYDROcodone-acetaminophen (NORCO/VICODIN) 5-325 MG tablet Take 1 tablet by mouth every 6 (six) hours as needed.  . insulin glargine (LANTUS) 100 unit/mL SOPN Inject 0.3 mLs (30 Units total) into the skin at bedtime.  . isosorbide mononitrate (IMDUR) 60 MG 24 hr tablet Take 1 tablet (60 mg total) by mouth daily.  . montelukast (SINGULAIR) 10 MG tablet Take 10 mg by mouth at bedtime.  . polyethylene glycol (MIRALAX / GLYCOLAX) packet Take 17 g by mouth daily. (Patient taking differently: Take 17 g by mouth daily as needed for mild constipation. )  . potassium chloride (K-DUR) 10 MEQ tablet Take  1 tablet (10 mEq total) by mouth daily.  Marland Kitchen senna (SENOKOT) 8.6 MG TABS tablet Take 1 tablet (8.6 mg total) by mouth daily. (Patient taking differently: Take 1 tablet by mouth daily as needed for mild constipation. )  . Skin Protectants, Misc. (EUCERIN) cream Apply 1 application topically See admin instructions. Apply to both feet daily after showering   Current Facility-Administered Medications (Other)  Medication Route  . Bevacizumab (AVASTIN) SOLN 1.25 mg Intravitreal  . Bevacizumab (AVASTIN) SOLN 1.25 mg Intravitreal  . Bevacizumab (AVASTIN) SOLN 1.25 mg Intravitreal  . Bevacizumab (AVASTIN) SOLN 1.25 mg Intravitreal      REVIEW OF  SYSTEMS: ROS    Positive for: Endocrine, Cardiovascular, Eyes, Respiratory   Negative for: Constitutional, Gastrointestinal, Neurological, Skin, Genitourinary, Musculoskeletal, HENT, Psychiatric, Allergic/Imm, Heme/Lymph   Last edited by Theodore Demark, COA on 07/05/2019  9:56 AM. (History)       ALLERGIES Allergies  Allergen Reactions  . Amlodipine Besy-Benazepril Hcl Anaphylaxis, Shortness Of Breath and Swelling    Mouth and tongue swelling  . Shellfish Allergy Anaphylaxis, Shortness Of Breath and Swelling    PAST MEDICAL HISTORY Past Medical History:  Diagnosis Date  . Asthma   . Back pain   . CHF (congestive heart failure) ([place])   . Chronic kidney disease   . DM (diabetes mellitus) ([place])   . Hepatitis C   . HTN (hypertension)   . RBBB (right bundle branch block)   . Stroke [facility]) 2018   Past Surgical History:  Procedure Laterality Date  . BACK SURGERY  1995  . PARS PLANA VITRECTOMY Left 01/25/2017  . TOE AMPUTATION  2016    FAMILY HISTORY Family History  Problem Relation Age of Onset  . Coronary artery disease Mother        MI in her 63s  . Diabetes Mother   . Macular degeneration Mother   . Hypertension Other   . Diabetes Other   . Alzheimer's disease Other   . Coronary artery disease Brother   . Diabetes Brother   . Diabetes Sister     SOCIAL HISTORY Social History   Tobacco Use  . Smoking status: Former Smoker    Quit date: 01/07/2018    Years since quitting: 1.4  . Smokeless tobacco: Never Used  Substance Use Topics  . Alcohol use: No    Comment: Few beers every other day hx  . Drug use: No         OPHTHALMIC EXAM:  Base Eye Exam    Visual Acuity (Snellen - Linear)      Right Left   Dist [place] 20/60 -1 20/50 -1   Dist ph [place] 20/50 20/40 -1       Tonometry (Tonopen, 9:53 AM)      Right Left   Pressure 14 13       Pupils      Dark Light Shape React APD   Right 3 2 Round Slow None   Left 3 2 Round Slow None       Visual Fields  (Counting fingers)      Left Right    Full Full       Extraocular Movement      Right Left    Full, Ortho Full, Ortho       Neuro/Psych    Oriented x3: Yes   Mood/Affect: Normal       Dilation    Both eyes: 1.0% Mydriacyl, 2.5% Phenylephrine @ 9:53 AM  Slit Lamp and Fundus Exam    Slit Lamp Exam      Right Left   Lids/Lashes Dermatochalasis - upper lid, mild Meibomian gland dysfunction Dermatochalasis - upper lid, mild Meibomian gland dysfunction   Conjunctiva/Sclera nasal Pinguecula, Melanosis nasal/temporal Pinguecula, Melanosis   Cornea 1+ Punctate epithelial erosions Mild Debris in tear film, 1+ Punctate epithelial erosions nasal and inferior   Anterior Chamber deep, narrow temporal angle deep, narrow temporal angle   Iris Round and dilated, No NVI Round and dilated, No NVI   Lens 2-3+ Nuclear sclerosis, 2-3+ Cortical cataract 3+ Nuclear sclerosis, 2-3+ Cortical cataract, 1+ Posterior subcapsular cataract   Vitreous Vitreous syneresis post vitrectomy       Fundus Exam      Right Left   Disc mild pallor, sharp rim pink and sharp   C/D Ratio 0.4 0.6   Macula Blunted foveal reflex, scattered MA/DBH, +Epiretinal membrane, +edema temporal macula - mild improvement, scattered exudates - persistent Flat, blunted foveal reflex, scattered Microaneurysms, scattered IRH - cluster just temporal to fovea, temporal macula very ischemic, interval improvement in edema   Vessels severe Vascular attenuation, sclerotic arterioles, +AV crossing changes, Copper wiring, focal fibrosis superior to disc Vascular attenuation, mild Tortuousity   Periphery Attached, 360 PRP, good posterior laser fill in, scattered IRH Attached, good 360 PRP in place          IMAGING AND PROCEDURES  Imaging and Procedures for @TODAY @  Intravitreal Injection, Pharmacologic Agent - OD - Right Eye       Time Out 07/05/2019. 10:48 AM. Confirmed correct patient, procedure, site, and patient consented.    Anesthesia Topical anesthesia was used. Anesthetic medications included Lidocaine 2%, Proparacaine 0.5%.   Procedure Preparation included 5% betadine to ocular surface. A supplied (32 g) needle was used.   Injection:  2 mg aflibercept Alfonse Flavors) SOLN   NDC: A3590391, Lot: [id number], Expiration date: 12/08/2019   Route: Intravitreal, Site: Right Eye, Waste: 0.05 mL  Post-op Post injection exam found visual acuity of at least counting fingers. The patient tolerated the procedure well. There were no complications. The patient received written and verbal post procedure care education.        Intravitreal Injection, Pharmacologic Agent - OS - Left Eye       Time Out 07/05/2019. 10:49 AM. Confirmed correct patient, procedure, site, and patient consented.   Anesthesia Topical anesthesia was used. Anesthetic medications included Lidocaine 2%, Proparacaine 0.5%.   Procedure Preparation included 5% betadine to ocular surface, eyelid speculum. A supplied (32 g) needle was used.   Injection:  2 mg aflibercept Alfonse Flavors) SOLN   NDC: A3590391, Lot: [id number], Expiration date: 12/08/2019   Route: Intravitreal, Site: Left Eye, Waste: 0.05 mL  Post-op Post injection exam found visual acuity of at least counting fingers. The patient tolerated the procedure well. There were no complications. The patient received written and verbal post procedure care education.        OCT, Retina - OU - Both Eyes       Right Eye Quality was good. Central Foveal Thickness: 241. Progression has improved. Findings include abnormal foveal contour, epiretinal membrane, no SRF, outer retinal atrophy, intraretinal fluid, macular pucker, intraretinal hyper-reflective material (Interval improvement in IRF ST macula).   Left Eye Quality was borderline. Central Foveal Thickness: 264. Progression has improved. Findings include abnormal foveal contour, intraretinal fluid, no SRF, epiretinal membrane, outer retinal  atrophy, inner retinal atrophy (Interval improvement in nasal IRF; mild interval improvement in temporal IRF).  Notes *Images captured and stored on drive  Diagnosis / Impression:  OU: ERM, diffuse atrophy, +DME OD: interval improvement in  IRF ST macula OS: Interval improvement in nasal IRF; mild interval improvement in temporal IRF Excellent early response to [facility] OU  Clinical management:  See below  Abbreviations: NFP - Normal foveal profile. CME - cystoid macular edema. PED - pigment epithelial detachment. IRF - intraretinal fluid. SRF - subretinal fluid. EZ - ellipsoid zone. ERM - epiretinal membrane. ORA - outer retinal atrophy. ORT - outer retinal tubulation. SRHM - subretinal hyper-reflective material                 ASSESSMENT/PLAN:    ICD-10-CM   1. Proliferative diabetic retinopathy of both eyes with macular edema associated with type 2 diabetes mellitus (HCC)  Q73.4193 Intravitreal Injection, Pharmacologic Agent - OD - Right Eye    Intravitreal Injection, Pharmacologic Agent - OS - Left Eye    aflibercept (EYLEA) SOLN 2 mg    aflibercept (EYLEA) SOLN 2 mg  2. Retinal edema  H35.81 OCT, Retina - OU - Both Eyes  3. Epiretinal membrane (ERM) of both eyes  H35.373   4. Essential hypertension  I10   5. Hypertensive retinopathy of both eyes  H35.033   6. Combined forms of age-related cataract of both eyes  H25.813   7. Dry eyes  H04.123     1,2. Proliferative diabetic retinopathy w/ DME, OU  - former pt of Dwana Melena at [facility] and Adonis Brook at [place] -- known history of proliferative diabetic retinopathy  - history PPV OS and laser PRP OD with Dr. Manuella Ghazi for PDR OU w/ TRD OS -- 01/25/17  - history of intravitreal anti-VEGF therapy with Dr. Anderson [place] -- last IVE ~01/2018  - s/p PRP fill in OD (02.27.20) -- good fill in laser in place  - s/p IVA OU #1 (02.13.20), #2 (03.12.20), #3 (05.10.20), #4 (06.08.20), #5 (07.06.20), #6 (08.03.20), #7 (08.31.20), #8  (09.28.20), #9 (10.26.20)  - ?resistance to IVA  - s/p IVE OU #1 (11.23.20)  - delayed f/u from 4 wks to 8 wks  - exam shows stable regression of fine NVD OD, PRP laser in place OU  - FA (08.31.20) shows retinal NV vastly improved OD; significant vascular perfusion defects and increased FAZ OU; late leaking MA OU  - OCT shows interval improvement in  IRF OU -- good response to IVE  - BCVA slightly worse OD at 20/50 today, down from 20/40, vision stable OS at 20/40  - recommend IVE OU #2 today (1.22.21)  - pt wishes to proceed  - RBA of procedure discussed, questions answered  - informed consent obtained and signed  - see procedure note  - [employer]yle[employer] Benefits Investigation initiated 10.26.2020 -- approved as of 11.23.20 and verified for 2021  - f/u 6 weeks, DFE/OCT/likely injection OU  3. Epiretinal membrane, both eyes   - mild ERM  - asymptomatic, no metamorphopsia  - no indication for surgery at this time  - monitor for now  4,5. Hypertensive retinopathy OU  - discussed importance of tight BP control  - monitor  6. Combined form cataract OU   - The symptoms of cataract, surgical options, and treatments and risks were discussed with patient.  - discussed diagnosis and progression  - OS > OD, likely sequela from PPV OS  - approaching visual significance  - recommend referral to [facility] Kathlen Mody at [facility] for cat eval  - [facility] to make formal cataract referral  7. Dry eyes OU  - improving  - recommend artificial tears and lubricating ointment as needed  Ophthalmic Meds Ordered this visit:  Meds ordered this encounter  Medications  . aflibercept (EYLEA) SOLN 2 mg  . aflibercept (EYLEA) SOLN 2 mg       Return in about 6 weeks (around 08/16/2019) for DFE, OCT, possible injection.  There are no Patient Instructions on file for this visit.   This document serves as a record of services personally performed by Gardiner Sleeper, MD, PhD. It was created on their  behalf by Roselee Nova, COMT. The creation of this record is the provider's dictation and/or activities during the visit.  Electronically signed by: Roselee Nova, COMT 07/05/19 1:15 PM   Gardiner Sleeper, M.D., Ph.D. Diseases & Surgery of the Retina and Vitreous [facility] [place]  I have reviewed the above documentation for accuracy and completeness, and I agree with the above. Gardiner Sleeper, M.D., Ph.D. 07/05/19 1:15 PM    Abbreviations: M myopia (nearsighted); A astigmatism; H hyperopia (farsighted); P presbyopia; Mrx spectacle prescription;  CTL contact lenses; OD right eye; OS left eye; OU both eyes  XT exotropia; ET esotropia; PEK punctate epithelial keratitis; PEE punctate epithelial erosions; DES dry eye syndrome; MGD meibomian gland dysfunction; ATs artificial tears; PFAT's preservative free artificial tears; [place] nuclear sclerotic cataract; PSC posterior subcapsular cataract; ERM epi-retinal membrane; PVD posterior vitreous detachment; RD retinal detachment; DM diabetes mellitus; DR diabetic retinopathy; NPDR non-proliferative diabetic retinopathy; PDR proliferative diabetic retinopathy; CSME clinically significant macular edema; DME diabetic macular edema; dbh dot blot hemorrhages; CWS cotton wool spot; POAG primary open angle glaucoma; C/D cup-to-disc ratio; HVF humphrey visual field; GVF goldmann visual field; OCT optical coherence tomography; IOP intraocular pressure; BRVO Branch retinal vein occlusion; CRVO central retinal vein occlusion; CRAO central retinal artery occlusion; BRAO branch retinal artery occlusion; RT retinal tear; SB scleral buckle; PPV pars plana vitrectomy; VH Vitreous hemorrhage; PRP panretinal laser photocoagulation; IVK intravitreal kenalog; VMT vitreomacular traction; MH Macular hole;  NVD neovascularization of the disc; NVE neovascularization elsewhere; AREDS age related eye disease study; ARMD age related macular degeneration; POAG primary open  angle glaucoma; EBMD epithelial/anterior basement membrane dystrophy; ACIOL anterior chamber intraocular lens; IOL intraocular lens; PCIOL posterior chamber intraocular lens; Phaco/IOL phacoemulsification with intraocular lens placement; [place] photorefractive keratectomy; LASIK laser assisted in situ keratomileusis; HTN hypertension; DM diabetes mellitus; COPD chronic obstructive pulmonary disease

## 2019-08-13 NOTE — Progress Notes (Signed)
[facility] Note  08/16/2019     CHIEF COMPLAINT Patient presents for Retina Follow Up   HISTORY OF PRESENT ILLNESS: John Jacobson is a 64 y.o. male who presents to the clinic today for:   HPI    Retina Follow Up    Patient presents with  Diabetic Retinopathy.  In both eyes.  This started weeks ago.  Severity is moderate.  Duration of weeks.  Since onset it is gradually improving.  I, the attending physician,  performed the HPI with the patient and updated documentation appropriately.          Comments    Pt states his vision is improving OD since CE/IOL on 08/08/19 (Dr. Kathlen Mody).  Patient is scheduled for CE/IOL OS on 08/28/19.   Pt has occasional eye pain OD.  He denies any new or worsening floaters or fol OU.  Pt reports: "Dr. Kathlen Mody said I have some swelling on the front part of my right eye"       Last edited by Bernarda Caffey, MD on 08/17/2019 11:48 PM. (History)    patient states he feels like his left eye vision is down, but right eye is stable, pt had cataract sx OD on February 24 with Dr. Kathlen Mody, he states Dr. Kathlen Mody told him he has some swelling on the front part of his eye so he is on PF, his left eye is scheduled for 03.17, pt had his first covid vaccine in February and the second is scheduled for 03.10   Referring physician: Angelica Pou, MD [place],  [place] [address]  HISTORICAL INFORMATION:   Selected notes from the MEDICAL RECORD NUMBER Referred by Dr. Dorian Pod [facility]) for DM exam LEE:  Ocular Hx- PMH-    CURRENT MEDICATIONS: Current Outpatient Medications (Ophthalmic Drugs)  Medication Sig  . carboxymethylcellulose 1 % ophthalmic solution Place 1 drop into both eyes 4 (four) times daily.   No current facility-administered medications for this visit. (Ophthalmic Drugs)   Current Outpatient Medications (Other)  Medication Sig  . acetaminophen (TYLENOL) 500 MG tablet Take 500 mg by mouth  2 (two) times daily as needed (for pain or headaches).  Marland Kitchen amoxicillin-clavulanate (AUGMENTIN) 875-125 MG tablet Take 1 tablet by mouth every 12 (twelve) hours.  Marland Kitchen aspirin EC 325 MG EC tablet Take 1 tablet (325 mg total) by mouth daily.  Marland Kitchen atorvastatin (LIPITOR) 20 MG tablet Take 20 mg by mouth daily.  . carvedilol (COREG) 25 MG tablet Take 12.5 mg by mouth every 12 (twelve) hours.  . Cholecalciferol (VITAMIN D3) 2000 units TABS Take 4,000 Units by mouth daily.  . cloNIDine (CATAPRES) 0.3 MG tablet Take 1 tablet (0.3 mg total) by mouth 3 (three) times daily.  . clopidogrel (PLAVIX) 75 MG tablet Take 75 mg by mouth daily.  . Cyanocobalamin (VITAMIN B-12 PO) Take 1 tablet by mouth daily.  Marland Kitchen doxazosin (CARDURA) 8 MG tablet Take 4 mg by mouth 2 (two) times daily.  . ferrous sulfate 325 (65 FE) MG tablet Take 325 mg by mouth every Monday, Wednesday, and Friday.  . furosemide (LASIX) 40 MG tablet Take 0.5 tablets (20 mg total) by mouth daily.  Marland Kitchen glucose 4 GM chewable tablet Chew 1 tablet by mouth daily as needed for low blood sugar.  . hydrALAZINE (APRESOLINE) 100 MG tablet Take 1 tablet (100 mg total) by mouth 3 (three) times daily.  Marland Kitchen HYDROcodone-acetaminophen (NORCO/VICODIN) 5-325 MG tablet Take  1 tablet by mouth every 6 (six) hours as needed.  . insulin glargine (LANTUS) 100 unit/mL SOPN Inject 0.3 mLs (30 Units total) into the skin at bedtime.  . isosorbide mononitrate (IMDUR) 60 MG 24 hr tablet Take 1 tablet (60 mg total) by mouth daily.  . montelukast (SINGULAIR) 10 MG tablet Take 10 mg by mouth at bedtime.  . polyethylene glycol (MIRALAX / GLYCOLAX) packet Take 17 g by mouth daily. (Patient taking differently: Take 17 g by mouth daily as needed for mild constipation. )  . potassium chloride (K-DUR) 10 MEQ tablet Take 1 tablet (10 mEq total) by mouth daily.  Marland Kitchen senna (SENOKOT) 8.6 MG TABS tablet Take 1 tablet (8.6 mg total) by mouth daily. (Patient taking differently: Take 1 tablet by mouth daily  as needed for mild constipation. )  . Skin Protectants, Misc. (EUCERIN) cream Apply 1 application topically See admin instructions. Apply to both feet daily after showering   Current Facility-Administered Medications (Other)  Medication Route  . Bevacizumab (AVASTIN) SOLN 1.25 mg Intravitreal  . Bevacizumab (AVASTIN) SOLN 1.25 mg Intravitreal  . Bevacizumab (AVASTIN) SOLN 1.25 mg Intravitreal  . Bevacizumab (AVASTIN) SOLN 1.25 mg Intravitreal      REVIEW OF SYSTEMS: ROS    Positive for: Endocrine, Cardiovascular, Eyes, Respiratory   Negative for: Constitutional, Gastrointestinal, Neurological, Skin, Genitourinary, Musculoskeletal, HENT, Psychiatric, Allergic/Imm, Heme/Lymph   Last edited by Doneen Poisson on 08/16/2019  8:53 AM. (History)       ALLERGIES Allergies  Allergen Reactions  . Amlodipine Besy-Benazepril Hcl Anaphylaxis, Shortness Of Breath and Swelling    Mouth and tongue swelling  . Shellfish Allergy Anaphylaxis, Shortness Of Breath and Swelling    PAST MEDICAL HISTORY Past Medical History:  Diagnosis Date  . Asthma   . Back pain   . CHF (congestive heart failure) ([place])   . Chronic kidney disease   . DM (diabetes mellitus) ([place])   . Hepatitis C   . HTN (hypertension)   . RBBB (right bundle branch block)   . Stroke [facility]) 2018   Past Surgical History:  Procedure Laterality Date  . BACK SURGERY  1995  . PARS PLANA VITRECTOMY Left 01/25/2017  . TOE AMPUTATION  2016    FAMILY HISTORY Family History  Problem Relation Age of Onset  . Coronary artery disease Mother        MI in her 29s  . Diabetes Mother   . Macular degeneration Mother   . Hypertension Other   . Diabetes Other   . Alzheimer's disease Other   . Coronary artery disease Brother   . Diabetes Brother   . Diabetes Sister     SOCIAL HISTORY Social History   Tobacco Use  . Smoking status: Former Smoker    Quit date: 01/07/2018    Years since quitting: 1.6  . Smokeless tobacco: Never  Used  Substance Use Topics  . Alcohol use: No    Comment: Few beers every other day hx  . Drug use: No         OPHTHALMIC EXAM:  Base Eye Exam    Visual Acuity (Snellen - Linear)      Right Left   Dist [place] 20/50 +1 20/70 -2   Dist ph [place] NI 20/50 +1   Correction: Glasses       Tonometry (Tonopen, 8:57 AM)      Right Left   Pressure 10 11       Pupils      Dark  Light Shape React APD   Right 2 1 Round Minimal 0   Left 2 1 Round Minimal 0       Visual Fields      Left Right    Full Full       Extraocular Movement      Right Left    Full Full       Neuro/Psych    Oriented x3: Yes   Mood/Affect: Normal       Dilation    Both eyes: 1.0% Mydriacyl, 2.5% Phenylephrine @ 8:57 AM        Slit Lamp and Fundus Exam    Slit Lamp Exam      Right Left   Lids/Lashes Dermatochalasis - upper lid, mild Meibomian gland dysfunction Dermatochalasis - upper lid, mild Meibomian gland dysfunction   Conjunctiva/Sclera nasal Pinguecula, Melanosis nasal/temporal Pinguecula, Melanosis   Cornea 1+ Punctate epithelial erosions, well healed temporal cataract wounds Mild Debris in tear film, 2+ Punctate epithelial erosions nasal and inferior   Anterior Chamber deep, narrow temporal angle, no cell or flare deep, narrow temporal angle   Iris Round and dilated, No NVI Round and dilated, No NVI   Lens PC IOL in good position 3+ Nuclear sclerosis, 2-3+ Cortical cataract, 1+ Posterior subcapsular cataract   Vitreous Vitreous syneresis post vitrectomy       Fundus Exam      Right Left   Disc mild pallor, sharp rim mild pallor, sharp rim   C/D Ratio 0.4 0.6   Macula Blunted foveal reflex, scattered MA/DBH, +Epiretinal membrane, +edema temporal macula - slightly increased, scattered exudates - persistent Flat, blunted foveal reflex, scattered Microaneurysms, scattered IRH - cluster just temporal to fovea, temporal macula very ischemic, mild interval increase in edema   Vessels severe Vascular  attenuation, sclerotic arterioles, +AV crossing changes, Copper wiring, focal fibrosis superior to disc Vascular attenuation, mild Tortuousity   Periphery Attached, 360 PRP, good posterior laser fill in, scattered IRH Attached, good 360 PRP in place          IMAGING AND PROCEDURES  Imaging and Procedures for @TODAY @  OCT, Retina - OU - Both Eyes       Right Eye Quality was good. Central Foveal Thickness: 247. Progression has worsened. Findings include abnormal foveal contour, epiretinal membrane, no SRF, outer retinal atrophy, intraretinal fluid, macular pucker, intraretinal hyper-reflective material (Mild Interval increase in IRF temporal macula).   Left Eye Quality was borderline. Central Foveal Thickness: 294. Progression has worsened. Findings include abnormal foveal contour, intraretinal fluid, no SRF, epiretinal membrane, outer retinal atrophy, inner retinal atrophy (Mild Interval increase in IRF temporal macula).   Notes *Images captured and stored on drive  Diagnosis / Impression:  OU: ERM, diffuse atrophy, +DME OD: Mild Interval increase in IRF temporal macula OS: Mild Interval increase in IRF temporal macula  Clinical management:  See below  Abbreviations: NFP - Normal foveal profile. CME - cystoid macular edema. PED - pigment epithelial detachment. IRF - intraretinal fluid. SRF - subretinal fluid. EZ - ellipsoid zone. ERM - epiretinal membrane. ORA - outer retinal atrophy. ORT - outer retinal tubulation. SRHM - subretinal hyper-reflective material        Intravitreal Injection, Pharmacologic Agent - OD - Right Eye       Time Out 08/16/2019. 9:21 AM. Confirmed correct patient, procedure, site, and patient consented.   Anesthesia Topical anesthesia was used. Anesthetic medications included Lidocaine 2%, Proparacaine 0.5%.   Procedure Preparation included 5% betadine to ocular surface,  eyelid speculum. A (32 g) needle was used.   Injection:  2 mg aflibercept  Alfonse Flavors) SOLN   NDC: A3590391, Lot: [id number], Expiration date: 01/07/2020   Route: Intravitreal, Site: Right Eye, Waste: 0.05 mL  Post-op Post injection exam found visual acuity of at least counting fingers. The patient tolerated the procedure well. There were no complications. The patient received written and verbal post procedure care education.        Intravitreal Injection, Pharmacologic Agent - OS - Left Eye       Time Out 08/16/2019. 9:22 AM. Confirmed correct patient, procedure, site, and patient consented.   Anesthesia Topical anesthesia was used. Anesthetic medications included Lidocaine 2%, Proparacaine 0.5%.   Procedure Preparation included 5% betadine to ocular surface, eyelid speculum. A (32 g) needle was used.   Injection:  2 mg aflibercept Alfonse Flavors) SOLN   NDC: A3590391, Lot: [id number], Expiration date: 02/07/2020   Route: Intravitreal, Site: Left Eye, Waste: 0.05 mL  Post-op Post injection exam found visual acuity of at least counting fingers. The patient tolerated the procedure well. There were no complications. The patient received written and verbal post procedure care education.                 ASSESSMENT/PLAN:    ICD-10-CM   1. Proliferative diabetic retinopathy of both eyes with macular edema associated with type 2 diabetes mellitus (HCC)  H54.5625 Intravitreal Injection, Pharmacologic Agent - OD - Right Eye    Intravitreal Injection, Pharmacologic Agent - OS - Left Eye    aflibercept (EYLEA) SOLN 2 mg    aflibercept (EYLEA) SOLN 2 mg  2. Retinal edema  H35.81 OCT, Retina - OU - Both Eyes  3. Epiretinal membrane (ERM) of both eyes  H35.373   4. Essential hypertension  I10   5. Hypertensive retinopathy of both eyes  H35.033   6. Combined forms of age-related cataract of both eyes  H25.813   7. Dry eyes  H04.123     1,2. Proliferative diabetic retinopathy w/ DME, OU  - former pt of Dwana Melena at [facility] and Adonis Brook at [place] -- known history of proliferative diabetic retinopathy  - history PPV OS and laser PRP OD with Dr. Manuella Ghazi for PDR OU w/ TRD OS -- 01/25/17  - history of intravitreal anti-VEGF therapy with Dr. Anderson Malta -- last IVE ~01/2018  - s/p PRP fill in OD (02.27.20) -- good fill in laser in place  - s/p IVA OU #1 (02.13.20), #2 (03.12.20), #3 (05.10.20), #4 (06.08.20), #5 (07.06.20), #6 (08.03.20), #7 (08.31.20), #8 (09.28.20), #9 (10.26.20)  - ?resistance to IVA  - s/p IVE OU #1 (11.23.20), #2 (01.22.21)  - delayed f/u from 4 wks to 8 wks  - exam shows stable regression of fine NVD OD, PRP laser in place OU  - FA (08.31.20) shows retinal NV vastly improved OD; significant vascular perfusion defects and increased FAZ OU; late leaking MA OU  - OCT shows   - BCVA OD stable at 20/50 today; OS slightly decreased to 20/50 from 20/40  - recommend IVE OU #3 today (03.05.21)  - pt wishes to proceed  - RBA of procedure discussed, questions answered  - informed consent obtained   - Eylea informed consent form signed and scanned on 01.22.2021  - see procedure note  - [email or web address] Benefits Investigation initiated 10.26.2020 -- approved as of 11.23.20 and verified for 2021  - f/u 4 weeks, DFE/OCT/likely injection OU  3. Epiretinal membrane, both eyes   -  mild ERM  - asymptomatic, no metamorphopsia  - no indication for surgery at this time  - monitor for now  4,5. Hypertensive retinopathy OU  - discussed importance of tight BP control  - monitor  6. Pseudophakia OD  - s/p CE/IOL OD (2.25.21, [place])  - beautiful surgery, doing well  - monitor  7. Combined form cataract OS   - under the expert surgical management of Dr. Kathlen Mody  - scheduled for cataract surgery OS  7. Dry eyes OU  - improving  - recommend artificial tears and lubricating ointment as needed  Ophthalmic Meds Ordered this visit:  Meds ordered this encounter  Medications  . aflibercept (EYLEA) SOLN 2 mg  . aflibercept (EYLEA) SOLN 2 mg        Return in about 4 weeks (around 09/13/2019) for f/u PDR OU, DFE, OCT.  There are no Patient Instructions on file for this visit.   This document serves as a record of services personally performed by Gardiner Sleeper, MD, PhD. It was created on their behalf by Ernest Mallick, OA, an ophthalmic assistant. The creation of this record is the provider's dictation and/or activities during the visit.    Electronically signed by: Ernest Mallick, OA 03.02.2021 11:50 PM   Gardiner Sleeper, M.D., Ph.D. Diseases & Surgery of the Retina and Vitreous [facility]  I have reviewed the above documentation for accuracy and completeness, and I agree with the above. Gardiner Sleeper, M.D., Ph.D. 08/17/19 11:52 PM   Abbreviations: M myopia (nearsighted); A astigmatism; H hyperopia (farsighted); P presbyopia; Mrx spectacle prescription;  CTL contact lenses; OD right eye; OS left eye; OU both eyes  XT exotropia; ET esotropia; PEK punctate epithelial keratitis; PEE punctate epithelial erosions; DES dry eye syndrome; MGD meibomian gland dysfunction; ATs artificial tears; PFAT's preservative free artificial tears; [place] nuclear sclerotic cataract; PSC posterior subcapsular cataract; ERM epi-retinal membrane; PVD posterior vitreous detachment; RD retinal detachment; DM diabetes mellitus; DR diabetic retinopathy; NPDR non-proliferative diabetic retinopathy; PDR proliferative diabetic retinopathy; CSME clinically significant macular edema; DME diabetic macular edema; dbh dot blot hemorrhages; CWS cotton wool spot; POAG primary open angle glaucoma; C/D cup-to-disc ratio; HVF humphrey visual field; GVF goldmann visual field; OCT optical coherence tomography; IOP intraocular pressure; BRVO Branch retinal vein occlusion; CRVO central retinal vein occlusion; CRAO central retinal artery occlusion; BRAO branch retinal artery occlusion; RT retinal tear; SB scleral buckle; PPV pars plana vitrectomy; VH Vitreous  hemorrhage; PRP panretinal laser photocoagulation; IVK intravitreal kenalog; VMT vitreomacular traction; MH Macular hole;  NVD neovascularization of the disc; NVE neovascularization elsewhere; AREDS age related eye disease study; ARMD age related macular degeneration; POAG primary open angle glaucoma; EBMD epithelial/anterior basement membrane dystrophy; ACIOL anterior chamber intraocular lens; IOL intraocular lens; PCIOL posterior chamber intraocular lens; Phaco/IOL phacoemulsification with intraocular lens placement; [place] photorefractive keratectomy; LASIK laser assisted in situ keratomileusis; HTN hypertension; DM diabetes mellitus; COPD chronic obstructive pulmonary disease

## 2019-08-16 ENCOUNTER — Ambulatory Visit (INDEPENDENT_AMBULATORY_CARE_PROVIDER_SITE_OTHER): Payer: Medicare (Managed Care) | Admitting: Ophthalmology

## 2019-08-16 ENCOUNTER — Encounter (INDEPENDENT_AMBULATORY_CARE_PROVIDER_SITE_OTHER): Payer: Self-pay | Admitting: Ophthalmology

## 2019-08-16 DIAGNOSIS — I10 Essential (primary) hypertension: Secondary | ICD-10-CM | POA: Diagnosis not present

## 2019-08-16 DIAGNOSIS — Z961 Presence of intraocular lens: Secondary | ICD-10-CM

## 2019-08-16 DIAGNOSIS — H25812 Combined forms of age-related cataract, left eye: Secondary | ICD-10-CM

## 2019-08-16 DIAGNOSIS — H04123 Dry eye syndrome of bilateral lacrimal glands: Secondary | ICD-10-CM

## 2019-08-16 DIAGNOSIS — H3581 Retinal edema: Secondary | ICD-10-CM | POA: Diagnosis not present

## 2019-08-16 DIAGNOSIS — E113513 Type 2 diabetes mellitus with proliferative diabetic retinopathy with macular edema, bilateral: Secondary | ICD-10-CM | POA: Diagnosis not present

## 2019-08-16 DIAGNOSIS — H35033 Hypertensive retinopathy, bilateral: Secondary | ICD-10-CM

## 2019-08-16 DIAGNOSIS — H35373 Puckering of macula, bilateral: Secondary | ICD-10-CM

## 2019-08-17 MED ORDER — AFLIBERCEPT 2MG/0.05ML IZ SOLN FOR KALEIDOSCOPE
2.0000 mg | INTRAVITREAL | Status: AC | PRN
  Administered 2019-08-17: 2 mg via INTRAVITREAL

## 2019-09-12 NOTE — Progress Notes (Signed)
[facility] Note  09/13/2019     CHIEF COMPLAINT Patient presents for Retina Follow Up   HISTORY OF PRESENT ILLNESS: John Jacobson is a 64 y.o. male who presents to the clinic today for:   HPI    Retina Follow Up    Patient presents with  Diabetic Retinopathy.  In both eyes.  This started 4 weeks ago.  Severity is moderate.  I, the attending physician,  performed the HPI with the patient and updated documentation appropriately.          Comments    Patient here for 4 weeks retina follow up for PDR with DME OU. Patient states vision doing better. No eye pain.        Last edited by Bernarda Caffey, MD on 09/13/2019 11:52 AM. (History)    patient states he can tell his vision has improved in both eyes, he states he had cataract sx on his left eye since he was here last  Referring physician: Angelica Pou, MD [place],  [place] [address]  HISTORICAL INFORMATION:   Selected notes from the MEDICAL RECORD NUMBER Referred by Dr. Dorian Pod [facility]) for DM exam LEE:  Ocular Hx- PMH-    CURRENT MEDICATIONS: Current Outpatient Medications (Ophthalmic Drugs)  Medication Sig  . carboxymethylcellulose 1 % ophthalmic solution Place 1 drop into both eyes 4 (four) times daily.   No current facility-administered medications for this visit. (Ophthalmic Drugs)   Current Outpatient Medications (Other)  Medication Sig  . acetaminophen (TYLENOL) 500 MG tablet Take 500 mg by mouth 2 (two) times daily as needed (for pain or headaches).  Marland Kitchen amoxicillin-clavulanate (AUGMENTIN) 875-125 MG tablet Take 1 tablet by mouth every 12 (twelve) hours.  Marland Kitchen aspirin EC 325 MG EC tablet Take 1 tablet (325 mg total) by mouth daily.  Marland Kitchen atorvastatin (LIPITOR) 20 MG tablet Take 20 mg by mouth daily.  . carvedilol (COREG) 25 MG tablet Take 12.5 mg by mouth every 12 (twelve) hours.  . Cholecalciferol (VITAMIN D3) 2000 units TABS Take 4,000 Units by mouth  daily.  . cloNIDine (CATAPRES) 0.3 MG tablet Take 1 tablet (0.3 mg total) by mouth 3 (three) times daily.  . clopidogrel (PLAVIX) 75 MG tablet Take 75 mg by mouth daily.  . Cyanocobalamin (VITAMIN B-12 PO) Take 1 tablet by mouth daily.  Marland Kitchen doxazosin (CARDURA) 8 MG tablet Take 4 mg by mouth 2 (two) times daily.  . ferrous sulfate 325 (65 FE) MG tablet Take 325 mg by mouth every Monday, Wednesday, and Friday.  . furosemide (LASIX) 40 MG tablet Take 0.5 tablets (20 mg total) by mouth daily.  Marland Kitchen glucose 4 GM chewable tablet Chew 1 tablet by mouth daily as needed for low blood sugar.  . hydrALAZINE (APRESOLINE) 100 MG tablet Take 1 tablet (100 mg total) by mouth 3 (three) times daily.  Marland Kitchen HYDROcodone-acetaminophen (NORCO/VICODIN) 5-325 MG tablet Take 1 tablet by mouth every 6 (six) hours as needed.  . insulin glargine (LANTUS) 100 unit/mL SOPN Inject 0.3 mLs (30 Units total) into the skin at bedtime.  . isosorbide mononitrate (IMDUR) 60 MG 24 hr tablet Take 1 tablet (60 mg total) by mouth daily.  . montelukast (SINGULAIR) 10 MG tablet Take 10 mg by mouth at bedtime.  . polyethylene glycol (MIRALAX / GLYCOLAX) packet Take 17 g by mouth daily. (Patient taking differently: Take 17 g by mouth daily as needed for mild constipation. )  .  potassium chloride (K-DUR) 10 MEQ tablet Take 1 tablet (10 mEq total) by mouth daily.  Marland Kitchen senna (SENOKOT) 8.6 MG TABS tablet Take 1 tablet (8.6 mg total) by mouth daily. (Patient taking differently: Take 1 tablet by mouth daily as needed for mild constipation. )  . Skin Protectants, Misc. (EUCERIN) cream Apply 1 application topically See admin instructions. Apply to both feet daily after showering   Current Facility-Administered Medications (Other)  Medication Route  . Bevacizumab (AVASTIN) SOLN 1.25 mg Intravitreal  . Bevacizumab (AVASTIN) SOLN 1.25 mg Intravitreal  . Bevacizumab (AVASTIN) SOLN 1.25 mg Intravitreal  . Bevacizumab (AVASTIN) SOLN 1.25 mg Intravitreal       REVIEW OF SYSTEMS: ROS    Positive for: Endocrine, Cardiovascular, Eyes, Respiratory   Negative for: Constitutional, Gastrointestinal, Neurological, Skin, Genitourinary, Musculoskeletal, HENT, Psychiatric, Allergic/Imm, Heme/Lymph   Last edited by Theodore Demark, COA on 09/13/2019  8:28 AM. (History)       ALLERGIES Allergies  Allergen Reactions  . Amlodipine Besy-Benazepril Hcl Anaphylaxis, Shortness Of Breath and Swelling    Mouth and tongue swelling  . Shellfish Allergy Anaphylaxis, Shortness Of Breath and Swelling    PAST MEDICAL HISTORY Past Medical History:  Diagnosis Date  . Asthma   . Back pain   . CHF (congestive heart failure) ([place])   . Chronic kidney disease   . DM (diabetes mellitus) ([place])   . Hepatitis C   . HTN (hypertension)   . RBBB (right bundle branch block)   . Stroke [facility]) 2018   Past Surgical History:  Procedure Laterality Date  . BACK SURGERY  1995  . PARS PLANA VITRECTOMY Left 01/25/2017  . TOE AMPUTATION  2016    FAMILY HISTORY Family History  Problem Relation Age of Onset  . Coronary artery disease Mother        MI in her 20s  . Diabetes Mother   . Macular degeneration Mother   . Hypertension Other   . Diabetes Other   . Alzheimer's disease Other   . Coronary artery disease Brother   . Diabetes Brother   . Diabetes Sister     SOCIAL HISTORY Social History   Tobacco Use  . Smoking status: Former Smoker    Quit date: 01/07/2018    Years since quitting: 1.6  . Smokeless tobacco: Never Used  Substance Use Topics  . Alcohol use: No    Comment: Few beers every other day hx  . Drug use: No         OPHTHALMIC EXAM:  Base Eye Exam    Visual Acuity (Snellen - Linear)      Right Left   Dist [place] 20/50 -2 20/30 -1   Dist ph [place] 20/40 -1 NI       Tonometry (Tonopen, 8:25 AM)      Right Left   Pressure 13 13       Pupils      Dark Light Shape React APD   Right 2 1 Round Minimal None   Left 2 1 Round Minimal None        Visual Fields (Counting fingers)      Left Right    Full Full       Extraocular Movement      Right Left    Full, Ortho Full, Ortho       Neuro/Psych    Oriented x3: Yes   Mood/Affect: Normal       Dilation    Both eyes: 1.0% Mydriacyl, 2.5%  Phenylephrine @ 8:25 AM        Slit Lamp and Fundus Exam    Slit Lamp Exam      Right Left   Lids/Lashes Dermatochalasis - upper lid, mild Meibomian gland dysfunction Dermatochalasis - upper lid, mild Meibomian gland dysfunction   Conjunctiva/Sclera nasal Pinguecula, Melanosis nasal/temporal Pinguecula, Melanosis   Cornea 1+ Punctate epithelial erosions, well healed temporal cataract wounds, Debris in tear film Mild Debris in tear film, 1+ Punctate epithelial erosions nasal and inferior, well healed cataract wounds   Anterior Chamber deep, narrow temporal angle, no cell or flare deep, narrow temporal angle; no cell/flare   Iris Round and poorly dilated to 5.82mm, No NVI Round and poorly dilated to 5.60mm, No NVI   Lens PC IOL in good position PC IOL in good position, mild PC folds   Vitreous Vitreous syneresis post vitrectomy       Fundus Exam      Right Left   Disc mild pallor, sharp rim mild pallor, sharp rim   C/D Ratio 0.5 0.5   Macula Blunted foveal reflex, scattered MA/DBH, +Epiretinal membrane, +edema temporal macula - slightly improved, scattered exudates - persistent / slightly improved Flat, blunted foveal reflex, scattered Microaneurysms, scattered IRH - cluster just temporal to fovea, temporal macula very ischemic, mild interval improvement in edema   Vessels severe Vascular attenuation, sclerotic arterioles, +AV crossing changes, Copper wiring, focal fibrosis superior to disc Vascular attenuation, mild Tortuousity   Periphery Attached, 360 PRP, good posterior laser fill in, scattered IRH Attached, good 360 PRP in place          IMAGING AND PROCEDURES  Imaging and Procedures for @TODAY @  OCT, Retina - OU - Both  Eyes       Right Eye Quality was good. Central Foveal Thickness: 242. Progression has improved. Findings include abnormal foveal contour, epiretinal membrane, no SRF, outer retinal atrophy, intraretinal fluid, macular pucker, intraretinal hyper-reflective material (Interval improvement in IRF temporal macula).   Left Eye Quality was good. Central Foveal Thickness: 285. Progression has improved. Findings include abnormal foveal contour, intraretinal fluid, no SRF, epiretinal membrane, outer retinal atrophy, inner retinal atrophy (Interval improvement in IRF temporal macula).   Notes *Images captured and stored on drive  Diagnosis / Impression:  OU: ERM, diffuse atrophy, +DME OD:  Interval improvement in IRF temporal macula OS:  Interval improvement in IRF temporal macula  Clinical management:  See below  Abbreviations: NFP - Normal foveal profile. CME - cystoid macular edema. PED - pigment epithelial detachment. IRF - intraretinal fluid. SRF - subretinal fluid. EZ - ellipsoid zone. ERM - epiretinal membrane. ORA - outer retinal atrophy. ORT - outer retinal tubulation. SRHM - subretinal hyper-reflective material        Intravitreal Injection, Pharmacologic Agent - OD - Right Eye       Time Out 09/13/2019. 8:42 AM. Confirmed correct patient, procedure, site, and patient consented.   Anesthesia Topical anesthesia was used. Anesthetic medications included Lidocaine 2%, Proparacaine 0.5%.   Procedure Preparation included 5% betadine to ocular surface, eyelid speculum. A (32g) needle was used.   Injection:  1.25 mg Bevacizumab (AVASTIN) SOLN   NDC: 27517-001-74, Lot: [id number], Expiration date: 02/07/2020   Route: Intravitreal, Site: Right Eye, Waste: 0.05 mL  Post-op Post injection exam found visual acuity of at least counting fingers. The patient tolerated the procedure well. There were no complications. The patient received written and verbal post procedure care education.         Intravitreal  Injection, Pharmacologic Agent - OS - Left Eye       Time Out 09/13/2019. 8:46 AM. Confirmed correct patient, procedure, site, and patient consented.   Anesthesia Topical anesthesia was used. Anesthetic medications included Lidocaine 2%, Proparacaine 0.5%.   Procedure Preparation included 5% betadine to ocular surface, eyelid speculum. A (32g) needle was used.   Injection:  2 mg aflibercept Alfonse Flavors) SOLN   NDC: M7179715, Lot: [id number], Expiration date: 01/07/2020   Route: Intravitreal, Site: Left Eye, Waste: 0.05 mL  Post-op Post injection exam found visual acuity of at least counting fingers. The patient tolerated the procedure well. There were no complications. The patient received written and verbal post procedure care education.                 ASSESSMENT/PLAN:    ICD-10-CM   1. Proliferative diabetic retinopathy of both eyes with macular edema associated with type 2 diabetes mellitus (HCC)  F62.1308 Intravitreal Injection, Pharmacologic Agent - OD - Right Eye    Intravitreal Injection, Pharmacologic Agent - OS - Left Eye    aflibercept (EYLEA) SOLN 2 mg    Bevacizumab (AVASTIN) SOLN 1.25 mg  2. Retinal edema  H35.81 OCT, Retina - OU - Both Eyes  3. Epiretinal membrane (ERM) of both eyes  H35.373   4. Essential hypertension  I10   5. Hypertensive retinopathy of both eyes  H35.033   6. Pseudophakia, both eyes  Z96.1   7. Dry eyes  H04.123     1,2. Proliferative diabetic retinopathy w/ DME, OU  - former pt of Dwana Melena at [facility] and Adonis Brook at [facility] -- known history of proliferative diabetic retinopathy  - history PPV OS and laser PRP OD with Dr. Manuella Ghazi for PDR OU w/ TRD OS -- 01/25/17  - history of intravitreal anti-VEGF therapy with Dr. Anderson Malta -- last IVE ~01/2018  - s/p PRP fill in OD (02.27.20) -- good fill in laser in place  - s/p IVA OU #1 (02.13.20), #2 (03.12.20), #3 (05.10.20), #4 (06.08.20), #5 (07.06.20), #6  (08.03.20), #7 (08.31.20), #8 (09.28.20), #9 (10.26.20)  - ?resistance to IVA  - s/p IVE OU #1 (11.23.20), #2 (01.22.21), #3 (03.05.21)  - exam shows stable regression of fine NVD OD, PRP laser in place OU  - FA (08.31.20) shows retinal NV vastly improved OD; significant vascular perfusion defects and increased FAZ OU; late leaking MA OU  - OCT shows interval improvement in IRF OU  - BCVA OD slightly improved to 20/40 from 20/50 today; OS improved to 20/30 from 20/50 (OS also had cataract surgery with Dr. Kathlen Mody since last visit here)  - recommend IVE OU #4 today (04.02.21)  - pt wishes to proceed  - RBA of procedure discussed, questions answered  - informed consent obtained   - Eylea informed consent form signed and scanned on 01.22.2021  - see procedure note  - Eylea4U Benefits Investigation initiated 10.26.2020 -- approved as of 11.23.20 and verified for 2021  - f/u 4 weeks, DFE/OCT/likely injection OU  3. Epiretinal membrane, both eyes   - mild ERM  - asymptomatic, no metamorphopsia  - no indication for surgery at this time  - monitor for now  4,5. Hypertensive retinopathy OU  - discussed importance of tight BP control  - monitor  6. Pseudophakia OU  - s/p CE/IOL OU with expert surgeon, Dr. Kathlen Mody (OD: 2.25.21, OS: 03.24.21)  - beautiful surgeries, doing well  - post op drops per Dr. Kathlen Mody  - monitor  7. Dry eyes OU  - improving  - recommend artificial tears and lubricating ointment as needed  Ophthalmic Meds Ordered this visit:  Meds ordered this encounter  Medications  . aflibercept (EYLEA) SOLN 2 mg  . Bevacizumab (AVASTIN) SOLN 1.25 mg       Return in about 4 weeks (around 10/11/2019) for f/u PDR OU, DFE, OCT.  There are no Patient Instructions on file for this visit.   This document serves as a record of services personally performed by Gardiner Sleeper, MD, PhD. It was created on their behalf by Ernest Mallick, OA, an ophthalmic assistant. The creation of this  record is the provider's dictation and/or activities during the visit.    Electronically signed by: Ernest Mallick, OA 04.01.2021 11:59 AM   Gardiner Sleeper, M.D., Ph.D. Diseases & Surgery of the Retina and Vitreous [facility]  I have reviewed the above documentation for accuracy and completeness, and I agree with the above. Gardiner Sleeper, M.D., Ph.D. 09/13/19 11:59 AM   Abbreviations: M myopia (nearsighted); A astigmatism; H hyperopia (farsighted); P presbyopia; Mrx spectacle prescription;  CTL contact lenses; OD right eye; OS left eye; OU both eyes  XT exotropia; ET esotropia; PEK punctate epithelial keratitis; PEE punctate epithelial erosions; DES dry eye syndrome; MGD meibomian gland dysfunction; ATs artificial tears; PFAT's preservative free artificial tears; Bishop nuclear sclerotic cataract; PSC posterior subcapsular cataract; ERM epi-retinal membrane; PVD posterior vitreous detachment; RD retinal detachment; DM diabetes mellitus; DR diabetic retinopathy; NPDR non-proliferative diabetic retinopathy; PDR proliferative diabetic retinopathy; CSME clinically significant macular edema; DME diabetic macular edema; dbh dot blot hemorrhages; CWS cotton wool spot; POAG primary open angle glaucoma; C/D cup-to-disc ratio; HVF humphrey visual field; GVF goldmann visual field; OCT optical coherence tomography; IOP intraocular pressure; BRVO Branch retinal vein occlusion; CRVO central retinal vein occlusion; CRAO central retinal artery occlusion; BRAO branch retinal artery occlusion; RT retinal tear; SB scleral buckle; PPV pars plana vitrectomy; VH Vitreous hemorrhage; PRP panretinal laser photocoagulation; IVK intravitreal kenalog; VMT vitreomacular traction; MH Macular hole;  NVD neovascularization of the disc; NVE neovascularization elsewhere; AREDS age related eye disease study; ARMD age related macular degeneration; POAG primary open angle glaucoma; EBMD epithelial/anterior basement  membrane dystrophy; ACIOL anterior chamber intraocular lens; IOL intraocular lens; PCIOL posterior chamber intraocular lens; Phaco/IOL phacoemulsification with intraocular lens placement; [place] photorefractive keratectomy; LASIK laser assisted in situ keratomileusis; HTN hypertension; DM diabetes mellitus; COPD chronic obstructive pulmonary disease

## 2019-09-13 ENCOUNTER — Ambulatory Visit (INDEPENDENT_AMBULATORY_CARE_PROVIDER_SITE_OTHER): Payer: Medicare (Managed Care) | Admitting: Ophthalmology

## 2019-09-13 ENCOUNTER — Encounter (INDEPENDENT_AMBULATORY_CARE_PROVIDER_SITE_OTHER): Payer: Self-pay | Admitting: Ophthalmology

## 2019-09-13 DIAGNOSIS — H3581 Retinal edema: Secondary | ICD-10-CM | POA: Diagnosis not present

## 2019-09-13 DIAGNOSIS — E113513 Type 2 diabetes mellitus with proliferative diabetic retinopathy with macular edema, bilateral: Secondary | ICD-10-CM | POA: Diagnosis not present

## 2019-09-13 DIAGNOSIS — Z961 Presence of intraocular lens: Secondary | ICD-10-CM

## 2019-09-13 DIAGNOSIS — H04123 Dry eye syndrome of bilateral lacrimal glands: Secondary | ICD-10-CM

## 2019-09-13 DIAGNOSIS — I10 Essential (primary) hypertension: Secondary | ICD-10-CM

## 2019-09-13 DIAGNOSIS — H35033 Hypertensive retinopathy, bilateral: Secondary | ICD-10-CM

## 2019-09-13 DIAGNOSIS — H35373 Puckering of macula, bilateral: Secondary | ICD-10-CM | POA: Diagnosis not present

## 2019-09-13 MED ORDER — AFLIBERCEPT 2MG/0.05ML IZ SOLN FOR KALEIDOSCOPE
2.0000 mg | INTRAVITREAL | Status: AC | PRN
  Administered 2019-09-13: 12:00:00 2 mg via INTRAVITREAL

## 2019-09-13 MED ORDER — BEVACIZUMAB CHEMO INJECTION 1.25MG/0.05ML SYRINGE FOR KALEIDOSCOPE
1.2500 mg | INTRAVITREAL | Status: AC | PRN
  Administered 2019-09-13: 12:00:00 1.25 mg via INTRAVITREAL

## 2019-10-09 NOTE — Progress Notes (Signed)
[facility] Note  10/11/2019     CHIEF COMPLAINT Patient presents for Retina Follow Up   HISTORY OF PRESENT ILLNESS: John Jacobson is a 64 y.o. male who presents to the clinic today for:   HPI    Retina Follow Up    Patient presents with  Diabetic Retinopathy.  In both eyes.  This started 4 weeks ago.  Severity is moderate.  I, the attending physician,  performed the HPI with the patient and updated documentation appropriately.          Comments    Patient here for 4 weeks retina follow up for PDR OU. Patient  States vision seems to be better since cataract surgery OU. Has new rx for glasses. Not filled yet.        Last edited by Bernarda Caffey, MD on 10/11/2019  9:43 AM. (History)    patient states   Referring physician: Angelica Pou, [place],  [place] [id number]  HISTORICAL INFORMATION:   Selected notes from the MEDICAL RECORD NUMBER Referred by Dr. Dorian Pod [facility]) for DM exam LEE:  Ocular Hx- PMH-    CURRENT MEDICATIONS: Current Outpatient Medications (Ophthalmic Drugs)  Medication Sig  . carboxymethylcellulose 1 % ophthalmic solution Place 1 drop into both eyes 4 (four) times daily.   No current facility-administered medications for this visit. (Ophthalmic Drugs)   Current Outpatient Medications (Other)  Medication Sig  . acetaminophen (TYLENOL) 500 MG tablet Take 500 mg by mouth 2 (two) times daily as needed (for pain or headaches).  Marland Kitchen amoxicillin-clavulanate (AUGMENTIN) 875-125 MG tablet Take 1 tablet by mouth Jacobson 12 (twelve) hours.  Marland Kitchen aspirin EC 325 MG EC tablet Take 1 tablet (325 mg total) by mouth daily.  Marland Kitchen atorvastatin (LIPITOR) 20 MG tablet Take 20 mg by mouth daily.  . carvedilol (COREG) 25 MG tablet Take 12.5 mg by mouth Jacobson 12 (twelve) hours.  . Cholecalciferol (VITAMIN D3) 2000 units TABS Take 4,000 Units by mouth daily.  . cloNIDine (CATAPRES) 0.3 MG tablet Take 1 tablet (0.3  mg total) by mouth 3 (three) times daily.  . clopidogrel (PLAVIX) 75 MG tablet Take 75 mg by mouth daily.  . Cyanocobalamin (VITAMIN B-12 PO) Take 1 tablet by mouth daily.  Marland Kitchen doxazosin (CARDURA) 8 MG tablet Take 4 mg by mouth 2 (two) times daily.  . ferrous sulfate 325 (65 FE) MG tablet Take 325 mg by mouth Jacobson Monday, Wednesday, and Friday.  . furosemide (LASIX) 40 MG tablet Take 0.5 tablets (20 mg total) by mouth daily.  Marland Kitchen glucose 4 GM chewable tablet Chew 1 tablet by mouth daily as needed for low blood sugar.  . hydrALAZINE (APRESOLINE) 100 MG tablet Take 1 tablet (100 mg total) by mouth 3 (three) times daily.  Marland Kitchen HYDROcodone-acetaminophen (NORCO/VICODIN) 5-325 MG tablet Take 1 tablet by mouth Jacobson 6 (six) hours as needed.  . insulin glargine (LANTUS) 100 unit/mL SOPN Inject 0.3 mLs (30 Units total) into the skin at bedtime.  . isosorbide mononitrate (IMDUR) 60 MG 24 hr tablet Take 1 tablet (60 mg total) by mouth daily.  . montelukast (SINGULAIR) 10 MG tablet Take 10 mg by mouth at bedtime.  . polyethylene glycol (MIRALAX / GLYCOLAX) packet Take 17 g by mouth daily. (Patient taking differently: Take 17 g by mouth daily as needed for mild constipation. )  . potassium chloride (K-DUR) 10 MEQ tablet Take 1 tablet (10 mEq total)  by mouth daily.  Marland Kitchen senna (SENOKOT) 8.6 MG TABS tablet Take 1 tablet (8.6 mg total) by mouth daily. (Patient taking differently: Take 1 tablet by mouth daily as needed for mild constipation. )  . Skin Protectants, Misc. (EUCERIN) cream Apply 1 application topically See admin instructions. Apply to both feet daily after showering   Current Facility-Administered Medications (Other)  Medication Route  . Bevacizumab (AVASTIN) SOLN 1.25 mg Intravitreal  . Bevacizumab (AVASTIN) SOLN 1.25 mg Intravitreal  . Bevacizumab (AVASTIN) SOLN 1.25 mg Intravitreal  . Bevacizumab (AVASTIN) SOLN 1.25 mg Intravitreal      REVIEW OF SYSTEMS: ROS    Positive for: Endocrine,  Cardiovascular, Eyes, Respiratory   Negative for: Constitutional, Gastrointestinal, Neurological, Skin, Genitourinary, Musculoskeletal, HENT, Psychiatric, Allergic/Imm, Heme/Lymph   Last edited by Theodore Demark, COA on 10/11/2019  8:32 AM. (History)       ALLERGIES Allergies  Allergen Reactions  . Amlodipine Besy-Benazepril Hcl Anaphylaxis, Shortness Of Breath and Swelling    Mouth and tongue swelling  . Shellfish Allergy Anaphylaxis, Shortness Of Breath and Swelling    PAST MEDICAL HISTORY Past Medical History:  Diagnosis Date  . Asthma   . Back pain   . CHF (congestive heart failure) ([place])   . Chronic kidney disease   . DM (diabetes mellitus) ([place])   . Hepatitis C   . HTN (hypertension)   . RBBB (right bundle branch block)   . Stroke [facility]) 2018   Past Surgical History:  Procedure Laterality Date  . BACK SURGERY  1995  . PARS PLANA VITRECTOMY Left 01/25/2017  . TOE AMPUTATION  2016    FAMILY HISTORY Family History  Problem Relation Age of Onset  . Coronary artery disease Mother        MI in her 47s  . Diabetes Mother   . Macular degeneration Mother   . Hypertension Other   . Diabetes Other   . Alzheimer's disease Other   . Coronary artery disease Brother   . Diabetes Brother   . Diabetes Sister     SOCIAL HISTORY Social History   Tobacco Use  . Smoking status: Former Smoker    Quit date: 01/07/2018    Years since quitting: 1.7  . Smokeless tobacco: Never Used  Substance Use Topics  . Alcohol use: No    Comment: Few beers Jacobson other day hx  . Drug use: No         OPHTHALMIC EXAM:  Base Eye Exam    Visual Acuity (Snellen - Linear)      Right Left   Dist [place] 20/40 20/30 -2   Dist ph [place] NI 20/25       Tonometry (Tonopen, 8:30 AM)      Right Left   Pressure 14 12       Pupils      Dark Light Shape React APD   Right 2 1 Round Minimal None   Left 2 1 Round Minimal None       Visual Fields (Counting fingers)      Left Right    Full  Full       Extraocular Movement      Right Left    Full, Ortho Full, Ortho       Neuro/Psych    Oriented x3: Yes   Mood/Affect: Normal       Dilation    Both eyes: 1.0% Mydriacyl, 2.5% Phenylephrine @ 8:29 AM        Slit Lamp and  Fundus Exam    Slit Lamp Exam      Right Left   Lids/Lashes Dermatochalasis - upper lid, mild Meibomian gland dysfunction Dermatochalasis - upper lid, mild Meibomian gland dysfunction   Conjunctiva/Sclera nasal Pinguecula, Melanosis nasal/temporal Pinguecula, Melanosis   Cornea 1+ Punctate epithelial erosions, well healed temporal cataract wounds, Debris in tear film Mild Debris in tear film, 1+ Punctate epithelial erosions nasal and inferior, well healed cataract wounds   Anterior Chamber deep, narrow temporal angle, no cell or flare deep, narrow temporal angle; no cell/flare   Iris Round and poorly dilated to 5.75mm, No NVI Round and poorly dilated to 5.33mm, No NVI   Lens PC IOL in good position PC IOL in good position, mild PC folds   Vitreous Vitreous syneresis post vitrectomy       Fundus Exam      Right Left   Disc mild pallor, sharp rim mild pallor, sharp rim   C/D Ratio 0.5 0.5   Macula Blunted foveal reflex, scattered MA/DBH, +Epiretinal membrane, +edema temporal macula - slightly improved, scattered exudates - persistent / slightly improved Flat, blunted foveal reflex, scattered Microaneurysms, scattered IRH - cluster just temporal to fovea, temporal macula very ischemic, mild interval increase in edema/cystic changes   Vessels severe Vascular attenuation, sclerotic arterioles, +AV crossing changes, Copper wiring, focal fibrosis superior to disc Vascular attenuation, mild Tortuousity   Periphery Attached, 360 PRP, good posterior laser fill in, scattered IRH Attached, good 360 PRP in place          IMAGING AND PROCEDURES  Imaging and Procedures for @TODAY @  OCT, Retina - OU - Both Eyes       Right Eye Quality was good. Central  Foveal Thickness: 241. Progression has improved. Findings include abnormal foveal contour, epiretinal membrane, no SRF, outer retinal atrophy, intraretinal fluid, macular pucker, intraretinal hyper-reflective material (Interval improvement in ST IRF).   Left Eye Quality was good. Central Foveal Thickness: 300. Progression has worsened. Findings include abnormal foveal contour, intraretinal fluid, no SRF, epiretinal membrane, outer retinal atrophy, inner retinal atrophy (Mild interval increase in IRF).   Notes *Images captured and stored on drive  Diagnosis / Impression:  OU: ERM, diffuse atrophy, +DME OD:  Interval improvement in ST IRF  OS:  Mild nterval increase in IRF   Clinical management:  See below  Abbreviations: NFP - Normal foveal profile. CME - cystoid macular edema. PED - pigment epithelial detachment. IRF - intraretinal fluid. SRF - subretinal fluid. EZ - ellipsoid zone. ERM - epiretinal membrane. ORA - outer retinal atrophy. ORT - outer retinal tubulation. SRHM - subretinal hyper-reflective material        Intravitreal Injection, Pharmacologic Agent - OD - Right Eye       Time Out 10/11/2019. 9:11 AM. Confirmed correct patient, procedure, site, and patient consented.   Anesthesia Topical anesthesia was used. Anesthetic medications included Lidocaine 2%, Proparacaine 0.5%.   Procedure Preparation included 5% betadine to ocular surface, eyelid speculum. A (33g) needle was used.   Injection:  2 mg aflibercept Alfonse Flavors) SOLN   NDC: A3590391, Lot: [id number], Expiration date: 02/07/2020   Route: Intravitreal, Site: Right Eye, Waste: 0.05 mL  Post-op Post injection exam found visual acuity of at least counting fingers. The patient tolerated the procedure well. There were no complications. The patient received written and verbal post procedure care education.        Intravitreal Injection, Pharmacologic Agent - OS - Left Eye       Time Out  10/11/2019. 9:12 AM.  Confirmed correct patient, procedure, site, and patient consented.   Anesthesia Topical anesthesia was used. Anesthetic medications included Lidocaine 2%, Proparacaine 0.5%.   Procedure Preparation included 5% betadine to ocular surface, eyelid speculum. A (33g) needle was used.   Injection:  2 mg aflibercept Alfonse Flavors) SOLN   NDC: A3590391, Lot: [id number], Expiration date: 03/09/2020   Route: Intravitreal, Site: Left Eye, Waste: 0.05 mL  Post-op Post injection exam found visual acuity of at least counting fingers. The patient tolerated the procedure well. There were no complications. The patient received written and verbal post procedure care education.                 ASSESSMENT/PLAN:    ICD-10-CM   1. Proliferative diabetic retinopathy of both eyes with macular edema associated with type 2 diabetes mellitus (HCC)  H88.5027 Intravitreal Injection, Pharmacologic Agent - OD - Right Eye    Intravitreal Injection, Pharmacologic Agent - OS - Left Eye    aflibercept (EYLEA) SOLN 2 mg    aflibercept (EYLEA) SOLN 2 mg  2. Retinal edema  H35.81 OCT, Retina - OU - Both Eyes  3. Epiretinal membrane (ERM) of both eyes  H35.373   4. Essential hypertension  I10   5. Hypertensive retinopathy of both eyes  H35.033   6. Pseudophakia, both eyes  Z96.1   7. Dry eyes  H04.123     1,2. Proliferative diabetic retinopathy w/ DME, OU  - former pt of Dwana Melena at [facility] and [facility]on[facility] at [place] -- known history of proliferative diabetic retinopathy  - history PPV OS and laser PRP OD with Dr. Manuella Ghazi for PDR OU w/ TRD OS -- 01/25/17  - history of intravitreal anti-VEGF therapy with Dr. Anderson Malta -- last IVE ~01/2018  - s/p PRP fill in OD (02.27.20) -- good fill in laser in place  - s/p IVA OU #1 (02.13.20), #2 (03.12.20), #3 (05.10.20), #4 (06.08.20), #5 (07.06.20), #6 (08.03.20), #7 (08.31.20), #8 (09.28.20), #9 (10.26.20)  - ?resistance to IVA  - s/p IVE OU #1 (11.23.20), #2  (01.22.21), #3 (03.05.21), #4 (04.02.21)  - exam shows stable regression of fine NVD OD, PRP laser in place OU  - FA (08.31.20) shows retinal NV vastly improved OD; significant vascular perfusion defects and increased FAZ OU; late leaking MA OU  - OCT shows interval improvement in IRF OD; interval increase in IRF OS  - BCVA OD stable at 20/40; OS improved to 20/25 from 20/30  - recommend IVE OU #5 today (04.30.21)  - pt wishes to proceed  - RBA of procedure discussed, questions answered  - informed consent obtained   - Eylea informed consent form signed and scanned on 01.22.2021  - see procedure note  - Eylea4U Benefits Investigation initiated 10.26.2020 -- approved as of 11.23.20 and verified for 2021  - needs new [facility] authorizations for May injections  - f/u 4-5 weeks, DFE/OCT/likely injection OU  3. Epiretinal membrane, both eyes   - mild ERM  - asymptomatic, no metamorphopsia  - no indication for surgery at this time  - monitor for now  4,5. Hypertensive retinopathy OU  - discussed importance of tight BP control  - monitor  6. Pseudophakia OU  - s/p CE/IOL OU with expert surgeon, Dr. Kathlen Mody (OD: 2.25.21, OS: 03.24.21)  - beautiful surgeries, doing well  - post op drops per Dr. Kathlen Mody  - monitor  7. Dry eyes OU  - improving  - recommend artificial tears and lubricating  ointment as needed  Ophthalmic Meds Ordered this visit:  Meds ordered this encounter  Medications  . aflibercept (EYLEA) SOLN 2 mg  . aflibercept (EYLEA) SOLN 2 mg       Return for f/u 4-5 weeks, PDR OU, DFE, OCT.  There are no Patient Instructions on file for this visit.   This document serves as a record of services personally performed by Gardiner Sleeper, MD, PhD. It was created on their behalf by Ernest Mallick, OA, an ophthalmic assistant. The creation of this record is the provider's dictation and/or activities during the visit.    Electronically signed by: Ernest Mallick, OA 04.28.2021 12:15  PM   Gardiner Sleeper, M.D., Ph.D. Diseases & Surgery of the Retina and Vitreous [facility]  I have reviewed the above documentation for accuracy and completeness, and I agree with the above. Gardiner Sleeper, M.D., Ph.D. 10/11/19 12:15 PM    Abbreviations: M myopia (nearsighted); A astigmatism; H hyperopia (farsighted); P presbyopia; Mrx spectacle prescription;  CTL contact lenses; OD right eye; OS left eye; OU both eyes  XT exotropia; ET esotropia; PEK punctate epithelial keratitis; PEE punctate epithelial erosions; DES dry eye syndrome; MGD meibomian gland dysfunction; ATs artificial tears; PFAT's preservative free artificial tears; [place] nuclear sclerotic cataract; PSC posterior subcapsular cataract; ERM epi-retinal membrane; PVD posterior vitreous detachment; RD retinal detachment; DM diabetes mellitus; DR diabetic retinopathy; NPDR non-proliferative diabetic retinopathy; PDR proliferative diabetic retinopathy; CSME clinically significant macular edema; DME diabetic macular edema; dbh dot blot hemorrhages; CWS cotton wool spot; POAG primary open angle glaucoma; C/D cup-to-disc ratio; HVF humphrey visual field; GVF goldmann visual field; OCT optical coherence tomography; IOP intraocular pressure; BRVO Branch retinal vein occlusion; CRVO central retinal vein occlusion; CRAO central retinal artery occlusion; BRAO branch retinal artery occlusion; RT retinal tear; SB scleral buckle; PPV pars plana vitrectomy; VH Vitreous hemorrhage; PRP panretinal laser photocoagulation; IVK intravitreal kenalog; VMT vitreomacular traction; MH Macular hole;  NVD neovascularization of the disc; NVE neovascularization elsewhere; AREDS age related eye disease study; ARMD age related macular degeneration; POAG primary open angle glaucoma; EBMD epithelial/anterior basement membrane dystrophy; ACIOL anterior chamber intraocular lens; IOL intraocular lens; PCIOL posterior chamber intraocular lens; Phaco/IOL  phacoemulsification with intraocular lens placement; [place] photorefractive keratectomy; LASIK laser assisted in situ keratomileusis; HTN hypertension; DM diabetes mellitus; COPD chronic obstructive pulmonary disease

## 2019-10-11 ENCOUNTER — Encounter (INDEPENDENT_AMBULATORY_CARE_PROVIDER_SITE_OTHER): Payer: Self-pay | Admitting: Ophthalmology

## 2019-10-11 ENCOUNTER — Ambulatory Visit (INDEPENDENT_AMBULATORY_CARE_PROVIDER_SITE_OTHER): Payer: Medicare (Managed Care) | Admitting: Ophthalmology

## 2019-10-11 DIAGNOSIS — I10 Essential (primary) hypertension: Secondary | ICD-10-CM

## 2019-10-11 DIAGNOSIS — H3581 Retinal edema: Secondary | ICD-10-CM | POA: Diagnosis not present

## 2019-10-11 DIAGNOSIS — Z961 Presence of intraocular lens: Secondary | ICD-10-CM

## 2019-10-11 DIAGNOSIS — H35373 Puckering of macula, bilateral: Secondary | ICD-10-CM

## 2019-10-11 DIAGNOSIS — E113513 Type 2 diabetes mellitus with proliferative diabetic retinopathy with macular edema, bilateral: Secondary | ICD-10-CM

## 2019-10-11 DIAGNOSIS — H35033 Hypertensive retinopathy, bilateral: Secondary | ICD-10-CM

## 2019-10-11 DIAGNOSIS — H04123 Dry eye syndrome of bilateral lacrimal glands: Secondary | ICD-10-CM

## 2019-10-11 MED ORDER — AFLIBERCEPT 2MG/0.05ML IZ SOLN FOR KALEIDOSCOPE
2.0000 mg | INTRAVITREAL | Status: AC | PRN
  Administered 2019-10-11: 2 mg via INTRAVITREAL

## 2019-11-12 ENCOUNTER — Encounter (INDEPENDENT_AMBULATORY_CARE_PROVIDER_SITE_OTHER): Payer: Self-pay

## 2019-11-12 ENCOUNTER — Encounter (INDEPENDENT_AMBULATORY_CARE_PROVIDER_SITE_OTHER): Payer: Medicare (Managed Care) | Admitting: Ophthalmology

## 2019-11-15 NOTE — Progress Notes (Signed)
[facility] Note  11/18/2019     CHIEF COMPLAINT Patient presents for Retina Follow Up   HISTORY OF PRESENT ILLNESS: John Jacobson is a 64 y.o. male who presents to the clinic today for:   HPI    Retina Follow Up    Patient presents with  Diabetic Retinopathy.  In both eyes.  This started weeks ago.  Severity is moderate.  Duration of weeks.  Since onset it is stable.  I, the attending physician,  performed the HPI with the patient and updated documentation appropriately.          Comments    Pt states his vision is about the same OU.  Pt denies eye pain or discomfort and denies any new or worsening floaters or fol OU.       Last edited by Bernarda Caffey, MD on 11/18/2019  2:46 PM. (History)    patient states he is doing well, his vision seems a little blurry, but thinks it's dry eye  Referring physician: Angelica Pou, MD [place],   [id number]  HISTORICAL INFORMATION:   Selected notes from the MEDICAL RECORD NUMBER Referred by Dr. Dorian Pod [facility]) for DM exam LEE:  Ocular Hx- PMH-    CURRENT MEDICATIONS: Current Outpatient Medications (Ophthalmic Drugs)  Medication Sig  . carboxymethylcellulose 1 % ophthalmic solution Place 1 drop into both eyes 4 (four) times daily.   No current facility-administered medications for this visit. (Ophthalmic Drugs)   Current Outpatient Medications (Other)  Medication Sig  . acetaminophen (TYLENOL) 500 MG tablet Take 500 mg by mouth 2 (two) times daily as needed (for pain or headaches).  Marland Kitchen amoxicillin-clavulanate (AUGMENTIN) 875-125 MG tablet Take 1 tablet by mouth every 12 (twelve) hours.  Marland Kitchen aspirin EC 325 MG EC tablet Take 1 tablet (325 mg total) by mouth daily.  Marland Kitchen atorvastatin (LIPITOR) 20 MG tablet Take 20 mg by mouth daily.  . carvedilol (COREG) 25 MG tablet Take 12.5 mg by mouth every 12 (twelve) hours.  . Cholecalciferol (VITAMIN D3) 2000 units TABS Take  4,000 Units by mouth daily.  . cloNIDine (CATAPRES) 0.3 MG tablet Take 1 tablet (0.3 mg total) by mouth 3 (three) times daily.  . clopidogrel (PLAVIX) 75 MG tablet Take 75 mg by mouth daily.  . Cyanocobalamin (VITAMIN B-12 PO) Take 1 tablet by mouth daily.  Marland Kitchen doxazosin (CARDURA) 8 MG tablet Take 4 mg by mouth 2 (two) times daily.  . ferrous sulfate 325 (65 FE) MG tablet Take 325 mg by mouth every Monday, Wednesday, and Friday.  . furosemide (LASIX) 40 MG tablet Take 0.5 tablets (20 mg total) by mouth daily.  Marland Kitchen glucose 4 GM chewable tablet Chew 1 tablet by mouth daily as needed for low blood sugar.  . hydrALAZINE (APRESOLINE) 100 MG tablet Take 1 tablet (100 mg total) by mouth 3 (three) times daily.  Marland Kitchen HYDROcodone-acetaminophen (NORCO/VICODIN) 5-325 MG tablet Take 1 tablet by mouth every 6 (six) hours as needed.  . insulin glargine (LANTUS) 100 unit/mL SOPN Inject 0.3 mLs (30 Units total) into the skin at bedtime.  . isosorbide mononitrate (IMDUR) 60 MG 24 hr tablet Take 1 tablet (60 mg total) by mouth daily.  . montelukast (SINGULAIR) 10 MG tablet Take 10 mg by mouth at bedtime.  . polyethylene glycol (MIRALAX / GLYCOLAX) packet Take 17 g by mouth daily. (Patient taking differently: Take 17 g by mouth daily as needed for  mild constipation. )  . potassium chloride (K-DUR) 10 MEQ tablet Take 1 tablet (10 mEq total) by mouth daily.  Marland Kitchen senna (SENOKOT) 8.6 MG TABS tablet Take 1 tablet (8.6 mg total) by mouth daily. (Patient taking differently: Take 1 tablet by mouth daily as needed for mild constipation. )  . Skin Protectants, Misc. (EUCERIN) cream Apply 1 application topically See admin instructions. Apply to both feet daily after showering   Current Facility-Administered Medications (Other)  Medication Route  . Bevacizumab (AVASTIN) SOLN 1.25 mg Intravitreal  . Bevacizumab (AVASTIN) SOLN 1.25 mg Intravitreal  . Bevacizumab (AVASTIN) SOLN 1.25 mg Intravitreal  . Bevacizumab (AVASTIN) SOLN 1.25 mg  Intravitreal      REVIEW OF SYSTEMS: ROS    Positive for: Endocrine, Cardiovascular, Eyes, Respiratory   Negative for: Constitutional, Gastrointestinal, Neurological, Skin, Genitourinary, Musculoskeletal, HENT, Psychiatric, Allergic/Imm, Heme/Lymph   Last edited by Doneen Poisson on 11/18/2019  2:31 PM. (History)       ALLERGIES Allergies  Allergen Reactions  . Amlodipine Besy-Benazepril Hcl Anaphylaxis, Shortness Of Breath and Swelling    Mouth and tongue swelling  . Shellfish Allergy Anaphylaxis, Shortness Of Breath and Swelling    PAST MEDICAL HISTORY Past Medical History:  Diagnosis Date  . Asthma   . Back pain   . CHF (congestive heart failure) ([place])   . Chronic kidney disease   . DM (diabetes mellitus) ([place])   . Hepatitis C   . HTN (hypertension)   . RBBB (right bundle branch block)   . Stroke [facility]) 2018   Past Surgical History:  Procedure Laterality Date  . BACK SURGERY  1995  . PARS PLANA VITRECTOMY Left 01/25/2017  . TOE AMPUTATION  2016    FAMILY HISTORY Family History  Problem Relation Age of Onset  . Coronary artery disease Mother        MI in her 5s  . Diabetes Mother   . Macular degeneration Mother   . Hypertension Other   . Diabetes Other   . Alzheimer's disease Other   . Coronary artery disease Brother   . Diabetes Brother   . Diabetes Sister     SOCIAL HISTORY Social History   Tobacco Use  . Smoking status: Former Smoker    Quit date: 01/07/2018    Years since quitting: 1.8  . Smokeless tobacco: Never Used  Substance Use Topics  . Alcohol use: No    Comment: Few beers every other day hx  . Drug use: No         OPHTHALMIC EXAM:  Base Eye Exam    Visual Acuity (Snellen - Linear)      Right Left   Dist [place] 20/40 +2 20/30 -2   Dist ph [place] NI 20/30       Tonometry (Tonopen, 2:36 PM)      Right Left   Pressure 13 12       Pupils      Dark Light Shape React APD   Right 2 1 Round Minimal 0   Left 2 1 Round Minimal 0        Visual Fields      Left Right    Full Full       Extraocular Movement      Right Left    Full Full       Neuro/Psych    Oriented x3: Yes   Mood/Affect: Normal       Dilation    Both eyes: 1.0% Mydriacyl, 2.5% Phenylephrine @  2:36 PM        Slit Lamp and Fundus Exam    Slit Lamp Exam      Right Left   Lids/Lashes Dermatochalasis - upper lid, mild Meibomian gland dysfunction Dermatochalasis - upper lid, mild Meibomian gland dysfunction   Conjunctiva/Sclera nasal Pinguecula, Melanosis nasal/temporal Pinguecula, Melanosis   Cornea 1+ Punctate epithelial erosions, well healed temporal cataract wounds, Debris in tear film Mild Debris in tear film, 1+ Punctate epithelial erosions nasal and inferior, well healed cataract wounds   Anterior Chamber deep, narrow temporal angle, no cell or flare deep, narrow temporal angle; no cell/flare   Iris Round and poorly dilated to 5.41mm, No NVI Round and poorly dilated to 5.80mm, No NVI   Lens PC IOL in good position PC IOL in good position, mild PC folds   Vitreous Vitreous syneresis post vitrectomy       Fundus Exam      Right Left   Disc mild pallor, sharp rim mild pallor, sharp rim   C/D Ratio 0.4 0.5   Macula Blunted foveal reflex, scattered MA/DBH, +Epiretinal membrane, +edema temporal macula - slightly improved, scattered exudates - persistent / slightly improved Flat, blunted foveal reflex, scattered Microaneurysms, scattered IRH - cluster just temporal to fovea, temporal macula very ischemic, interval improvement in edema/cystic changes   Vessels severe Vascular attenuation, sclerotic arterioles, +AV crossing changes, Copper wiring, focal fibrosis superior to disc Vascular attenuation, mild Tortuousity   Periphery Attached, 360 PRP, good posterior laser fill in, scattered IRH Attached, good 360 PRP in place, scattered [facility]          IMAGING AND PROCEDURES  Imaging and Procedures for @TODAY @  OCT, Retina - OU - Both Eyes        Right Eye Quality was good. Central Foveal Thickness: 243. Progression has improved. Findings include abnormal foveal contour, epiretinal membrane, no SRF, outer retinal atrophy, intraretinal fluid, macular pucker, intraretinal hyper-reflective material (Interval improvement in IRF).   Left Eye Quality was good. Central Foveal Thickness: 264. Progression has improved. Findings include abnormal foveal contour, intraretinal fluid, no SRF, epiretinal membrane, outer retinal atrophy, inner retinal atrophy (major improvement in IRF, almost resolved centrally).   Notes *Images captured and stored on drive  Diagnosis / Impression:  OU: ERM, diffuse atrophy, +DME OD:  Interval improvement in IRF  OS:  major improvement in IRF, almost resolved centrally  Clinical management:  See below  Abbreviations: NFP - Normal foveal profile. CME - cystoid macular edema. PED - pigment epithelial detachment. IRF - intraretinal fluid. SRF - subretinal fluid. EZ - ellipsoid zone. ERM - epiretinal membrane. ORA - outer retinal atrophy. ORT - outer retinal tubulation. SRHM - subretinal hyper-reflective material        Intravitreal Injection, Pharmacologic Agent - OD - Right Eye       Time Out 11/18/2019. 2:50 PM. Confirmed correct patient, procedure, site, and patient consented.   Anesthesia Topical anesthesia was used. Anesthetic medications included Lidocaine 2%, Proparacaine 0.5%.   Procedure Preparation included 5% betadine to ocular surface, eyelid speculum. A (32g) needle was used.   Injection:  2 mg aflibercept Alfonse Flavors) SOLN   NDC: A3590391, Lot: [id number], Expiration date: 03/09/2020   Route: Intravitreal, Site: Right Eye, Waste: 0.05 mL  Post-op Post injection exam found visual acuity of at least counting fingers. The patient tolerated the procedure well. There were no complications. The patient received written and verbal post procedure care education.        Intravitreal  Injection,  Pharmacologic Agent - OS - Left Eye       Time Out 11/18/2019. 2:50 PM. Confirmed correct patient, procedure, site, and patient consented.   Anesthesia Topical anesthesia was used. Anesthetic medications included Lidocaine 2%, Proparacaine 0.5%.   Procedure Preparation included 5% betadine to ocular surface, eyelid speculum. A (32g) needle was used.   Injection:  2 mg aflibercept Alfonse Flavors) SOLN   NDC: A3590391, Lot: [id number], Expiration date: 03/09/2020   Route: Intravitreal, Site: Left Eye, Waste: 0.05 mL  Post-op Post injection exam found visual acuity of at least counting fingers. The patient tolerated the procedure well. There were no complications. The patient received written and verbal post procedure care education.                 ASSESSMENT/PLAN:    ICD-10-CM   1. Proliferative diabetic retinopathy of both eyes with macular edema associated with type 2 diabetes mellitus (HCC)  M42.6834 Intravitreal Injection, Pharmacologic Agent - OD - Right Eye    Intravitreal Injection, Pharmacologic Agent - OS - Left Eye    aflibercept (EYLEA) SOLN 2 mg    aflibercept (EYLEA) SOLN 2 mg  2. Retinal edema  H35.81 OCT, Retina - OU - Both Eyes  3. Epiretinal membrane (ERM) of both eyes  H35.373   4. Essential hypertension  I10   5. Hypertensive retinopathy of both eyes  H35.033   6. Pseudophakia, both eyes  Z96.1   7. Dry eyes  H04.123     1,2. Proliferative diabetic retinopathy w/ DME, OU  - former pt of Dwana Melena at [facility] and Adonis Brook at [place] -- known history of proliferative diabetic retinopathy  - history PPV OS and laser PRP OD with Dr. Manuella Ghazi for PDR OU w/ TRD OS -- 01/25/17  - history of intravitreal anti-VEGF therapy with Dr. Anderson Malta -- last IVE ~01/2018  - s/p PRP fill in OD (02.27.20) -- good fill in laser in place  - s/p IVA OU #1 (02.13.20), #2 (03.12.20), #3 (05.10.20), #4 (06.08.20), #5 (07.06.20), #6 (08.03.20), #7 (08.31.20), #8  (09.28.20), #9 (10.26.20)  - ?resistance to IVA  - s/p IVE OU #1 (11.23.20), #2 (01.22.21), #3 (03.05.21), #4 (04.02.21), #5 (04.30.21)  - exam shows stable regression of fine NVD OD, PRP laser in place OU  - FA (08.31.20) shows retinal NV vastly improved OD; significant vascular perfusion defects and increased FAZ OU; late leaking MA OU  - OCT shows interval improvement in IRF OD; major improvement in IRF OS - almost resolved centrally  - BCVA OD stable at 20/40; OS slightly decreased to 20/30 from 20/25  - recommend IVE OU #6 today (06.07.21)  - pt wishes to proceed  - RBA of procedure discussed, questions answered  - informed consent obtained   - Eylea informed consent form signed and scanned on 01.22.2021  - see procedure note  - Eylea4U Benefits Investigation initiated 10.26.2020 -- approved as of 11.23.20 and verified for 2021  - [facility]ACE has authorized 6 months of injections as of May 2021  - f/u 5-6 weeks, DFE/OCT/likely injection OU  3. Epiretinal membrane, both eyes   - mild ERM  - asymptomatic, no metamorphopsia  - no indication for surgery at this time  - monitor for now  4,5. Hypertensive retinopathy OU  - discussed importance of tight BP control  - monitor  6. Pseudophakia OU  - s/p CE/IOL OU with expert surgeon, Dr. Kathlen Mody (OD: 2.25.21, OS: 03.24.21)  - beautiful surgeries, doing well  -  post op drops per Dr. Kathlen Mody  - monitor  7. Dry eyes OU  - improving  - recommend artificial tears and lubricating ointment as needed  Ophthalmic Meds Ordered this visit:  Meds ordered this encounter  Medications  . aflibercept (EYLEA) SOLN 2 mg  . aflibercept (EYLEA) SOLN 2 mg       Return for f/u 5-6 weeks, PDR OU, DFE, OCT.  There are no Patient Instructions on file for this visit.   This document serves as a record of services personally performed by Gardiner Sleeper, MD, PhD. It was created on their behalf by Ernest Mallick, OA, an ophthalmic assistant. The creation of  this record is the provider's dictation and/or activities during the visit.    Electronically signed by: Ernest Mallick, OA 06.04.2021 3:16 PM   Gardiner Sleeper, M.D., Ph.D. Diseases & Surgery of the Retina and Vitreous [facility]  I have reviewed the above documentation for accuracy and completeness, and I agree with the above. Gardiner Sleeper, M.D., Ph.D. 11/18/19 3:16 PM     Abbreviations: M myopia (nearsighted); A astigmatism; H hyperopia (farsighted); P presbyopia; Mrx spectacle prescription;  CTL contact lenses; OD right eye; OS left eye; OU both eyes  XT exotropia; ET esotropia; PEK punctate epithelial keratitis; PEE punctate epithelial erosions; DES dry eye syndrome; MGD meibomian gland dysfunction; ATs artificial tears; PFAT's preservative free artificial tears; [place] nuclear sclerotic cataract; PSC posterior subcapsular cataract; ERM epi-retinal membrane; PVD posterior vitreous detachment; RD retinal detachment; DM diabetes mellitus; DR diabetic retinopathy; NPDR non-proliferative diabetic retinopathy; PDR proliferative diabetic retinopathy; CSME clinically significant macular edema; DME diabetic macular edema; dbh dot blot hemorrhages; CWS cotton wool spot; POAG primary open angle glaucoma; C/D cup-to-disc ratio; HVF humphrey visual field; GVF goldmann visual field; OCT optical coherence tomography; IOP intraocular pressure; BRVO Branch retinal vein occlusion; CRVO central retinal vein occlusion; CRAO central retinal artery occlusion; BRAO branch retinal artery occlusion; RT retinal tear; SB scleral buckle; PPV pars plana vitrectomy; VH Vitreous hemorrhage; PRP panretinal laser photocoagulation; IVK intravitreal kenalog; VMT vitreomacular traction; MH Macular hole;  NVD neovascularization of the disc; NVE neovascularization elsewhere; AREDS age related eye disease study; ARMD age related macular degeneration; POAG primary open angle glaucoma; EBMD epithelial/anterior  basement membrane dystrophy; ACIOL anterior chamber intraocular lens; IOL intraocular lens; PCIOL posterior chamber intraocular lens; Phaco/IOL phacoemulsification with intraocular lens placement; [place] photorefractive keratectomy; LASIK laser assisted in situ keratomileusis; HTN hypertension; DM diabetes mellitus; COPD chronic obstructive pulmonary disease

## 2019-11-18 ENCOUNTER — Ambulatory Visit (INDEPENDENT_AMBULATORY_CARE_PROVIDER_SITE_OTHER): Payer: Medicare (Managed Care) | Admitting: Ophthalmology

## 2019-11-18 ENCOUNTER — Other Ambulatory Visit: Payer: Self-pay

## 2019-11-18 ENCOUNTER — Encounter (INDEPENDENT_AMBULATORY_CARE_PROVIDER_SITE_OTHER): Payer: Self-pay | Admitting: Ophthalmology

## 2019-11-18 DIAGNOSIS — I10 Essential (primary) hypertension: Secondary | ICD-10-CM | POA: Diagnosis not present

## 2019-11-18 DIAGNOSIS — H35373 Puckering of macula, bilateral: Secondary | ICD-10-CM | POA: Diagnosis not present

## 2019-11-18 DIAGNOSIS — H04123 Dry eye syndrome of bilateral lacrimal glands: Secondary | ICD-10-CM

## 2019-11-18 DIAGNOSIS — Z961 Presence of intraocular lens: Secondary | ICD-10-CM

## 2019-11-18 DIAGNOSIS — H3581 Retinal edema: Secondary | ICD-10-CM | POA: Diagnosis not present

## 2019-11-18 DIAGNOSIS — E113513 Type 2 diabetes mellitus with proliferative diabetic retinopathy with macular edema, bilateral: Secondary | ICD-10-CM

## 2019-11-18 DIAGNOSIS — H35033 Hypertensive retinopathy, bilateral: Secondary | ICD-10-CM

## 2019-11-18 MED ORDER — AFLIBERCEPT 2MG/0.05ML IZ SOLN FOR KALEIDOSCOPE
2.0000 mg | INTRAVITREAL | Status: AC | PRN
  Administered 2019-11-18: 2 mg via INTRAVITREAL

## 2019-12-26 NOTE — Progress Notes (Addendum)
[facility] Note  12/30/2019     CHIEF COMPLAINT Patient presents for Retina Follow Up   HISTORY OF PRESENT ILLNESS: John Jacobson is a 64 y.o. male who presents to the clinic today for:   HPI    Retina Follow Up    Patient presents with  Diabetic Retinopathy.  In both eyes.  This started months ago.  Severity is moderate.  Duration of 6 weeks.  Since onset it is stable.  I, the attending physician,  performed the HPI with the patient and updated documentation appropriately.          Comments    64 y/o male pt here for 6 wk f/u for PDR w/mac edema OU.  No change in [place] OU.  Denies pain, FOL, floaters.  AT prn OU.  BS unknown.  A1C 7.2 1 mo ago.       Last edited by Bernarda Caffey, MD on 12/30/2019  2:10 PM. (History)    patient reports mild improvement in vision. Doing well  Referring physician: Angelica Pou, MD [address],  [place] [address]  HISTORICAL INFORMATION:   Selected notes from the MEDICAL RECORD NUMBER Referred by Dr. Dorian Pod [facility]) for DM exam LEE:  Ocular Hx- PMH-    CURRENT MEDICATIONS: Current Outpatient Medications (Ophthalmic Drugs)  Medication Sig  . carboxymethylcellulose 1 % ophthalmic solution Place 1 drop into both eyes 4 (four) times daily.   No current facility-administered medications for this visit. (Ophthalmic Drugs)   Current Outpatient Medications (Other)  Medication Sig  . acetaminophen (TYLENOL) 500 MG tablet Take 500 mg by mouth 2 (two) times daily as needed (for pain or headaches).  Marland Kitchen amoxicillin-clavulanate (AUGMENTIN) 875-125 MG tablet Take 1 tablet by mouth every 12 (twelve) hours.  Marland Kitchen aspirin EC 325 MG EC tablet Take 1 tablet (325 mg total) by mouth daily.  Marland Kitchen atorvastatin (LIPITOR) 20 MG tablet Take 20 mg by mouth daily.  . carvedilol (COREG) 25 MG tablet Take 12.5 mg by mouth every 12 (twelve) hours.  . Cholecalciferol (VITAMIN D3) 2000 units TABS Take 4,000  Units by mouth daily.  . cloNIDine (CATAPRES) 0.3 MG tablet Take 1 tablet (0.3 mg total) by mouth 3 (three) times daily.  . clopidogrel (PLAVIX) 75 MG tablet Take 75 mg by mouth daily.  . Cyanocobalamin (VITAMIN B-12 PO) Take 1 tablet by mouth daily.  Marland Kitchen doxazosin (CARDURA) 8 MG tablet Take 4 mg by mouth 2 (two) times daily.  . ferrous sulfate 325 (65 FE) MG tablet Take 325 mg by mouth every Monday, Wednesday, and Friday.  . furosemide (LASIX) 40 MG tablet Take 0.5 tablets (20 mg total) by mouth daily.  Marland Kitchen glucose 4 GM chewable tablet Chew 1 tablet by mouth daily as needed for low blood sugar.  . hydrALAZINE (APRESOLINE) 100 MG tablet Take 1 tablet (100 mg total) by mouth 3 (three) times daily.  Marland Kitchen HYDROcodone-acetaminophen (NORCO/VICODIN) 5-325 MG tablet Take 1 tablet by mouth every 6 (six) hours as needed.  . insulin glargine (LANTUS) 100 unit/mL SOPN Inject 0.3 mLs (30 Units total) into the skin at bedtime.  . isosorbide mononitrate (IMDUR) 60 MG 24 hr tablet Take 1 tablet (60 mg total) by mouth daily.  . montelukast (SINGULAIR) 10 MG tablet Take 10 mg by mouth at bedtime.  . polyethylene glycol (MIRALAX / GLYCOLAX) packet Take 17 g by mouth daily. (Patient taking differently: Take 17 g by mouth  daily as needed for mild constipation. )  . potassium chloride (K-DUR) 10 MEQ tablet Take 1 tablet (10 mEq total) by mouth daily.  Marland Kitchen senna (SENOKOT) 8.6 MG TABS tablet Take 1 tablet (8.6 mg total) by mouth daily. (Patient taking differently: Take 1 tablet by mouth daily as needed for mild constipation. )  . Skin Protectants, Misc. (EUCERIN) cream Apply 1 application topically See admin instructions. Apply to both feet daily after showering   Current Facility-Administered Medications (Other)  Medication Route  . Bevacizumab (AVASTIN) SOLN 1.25 mg Intravitreal  . Bevacizumab (AVASTIN) SOLN 1.25 mg Intravitreal  . Bevacizumab (AVASTIN) SOLN 1.25 mg Intravitreal  . Bevacizumab (AVASTIN) SOLN 1.25 mg  Intravitreal      REVIEW OF SYSTEMS: ROS    Positive for: Neurological, Genitourinary, Endocrine, Cardiovascular, Eyes   Negative for: Constitutional, Gastrointestinal, Skin, Musculoskeletal, HENT, Respiratory, Psychiatric, Allergic/Imm, Heme/Lymph   Last edited by Matthew Folks, COA on 12/30/2019  1:31 PM. (History)       ALLERGIES Allergies  Allergen Reactions  . Amlodipine Besy-Benazepril Hcl Anaphylaxis, Shortness Of Breath and Swelling    Mouth and tongue swelling  . Shellfish Allergy Anaphylaxis, Shortness Of Breath and Swelling    PAST MEDICAL HISTORY Past Medical History:  Diagnosis Date  . Asthma   . Back pain   . CHF (congestive heart failure) ([place])   . Chronic kidney disease   . Diabetic retinopathy ([employer])    PDR OU  . DM (diabetes mellitus) ([place])   . Hepatitis C   . HTN (hypertension)   . Hypertensive retinopathy    OU  . RBBB (right bundle branch block)   . Stroke [facility]) 2018   Past Surgical History:  Procedure Laterality Date  . BACK SURGERY  1995  . CATARACT EXTRACTION Bilateral   . EYE SURGERY Bilateral    Cat Sx  . PARS PLANA VITRECTOMY Left 01/25/2017  . TOE AMPUTATION  2016    FAMILY HISTORY Family History  Problem Relation Age of Onset  . Coronary artery disease Mother        MI in her 59s  . Diabetes Mother   . Macular degeneration Mother   . Hypertension Other   . Diabetes Other   . Alzheimer's disease Other   . Coronary artery disease Brother   . Diabetes Brother   . Diabetes Sister     SOCIAL HISTORY Social History   Tobacco Use  . Smoking status: Former Smoker    Quit date: 01/07/2018    Years since quitting: 1.9  . Smokeless tobacco: Never Used  Substance Use Topics  . Alcohol use: No    Comment: Few beers every other day hx  . Drug use: No         OPHTHALMIC EXAM:  Base Eye Exam    Visual Acuity (Snellen - Linear)      Right Left   Dist [place] 20/30 20/30 +2   Dist ph [place] 20/30 +2 NI       Tonometry  (Tonopen, 1:38 PM)      Right Left   Pressure 8 10       Pupils      Dark Light Shape React APD   Right 2 1 Round Minimal None   Left 2 1 Round Minimal None       Visual Fields (Counting fingers)      Left Right    Full Full       Extraocular Movement  Right Left    Full, Ortho Full, Ortho       Neuro/Psych    Oriented x3: Yes   Mood/Affect: Normal       Dilation    Both eyes: 1.0% Mydriacyl, 2.5% Phenylephrine @ 1:38 PM        Slit Lamp and Fundus Exam    Slit Lamp Exam      Right Left   Lids/Lashes Dermatochalasis - upper lid, mild Meibomian gland dysfunction Dermatochalasis - upper lid, mild Meibomian gland dysfunction   Conjunctiva/Sclera nasal Pinguecula, Melanosis nasal/temporal Pinguecula, Melanosis   Cornea 1+ Punctate epithelial erosions, well healed temporal cataract wounds, Debris in tear film Mild Debris in tear film, 1+ Punctate epithelial erosions nasal and inferior, well healed cataract wounds   Anterior Chamber deep, narrow temporal angle, no cell or flare deep, narrow temporal angle; no cell/flare   Iris Round and poorly dilated to 5.38m, No NVI Round and poorly dilated to 5.242m No NVI   Lens PC IOL in good position PC IOL in good position, mild PC folds   Vitreous Vitreous syneresis post vitrectomy       Fundus Exam      Right Left   Disc mild pallor, sharp rim mild pallor, sharp rim   C/D Ratio 0.4 0.5   Macula Blunted foveal reflex, scattered MA/DBH, +Epiretinal membrane, +edema temporal macula; scattered exudates - persistent / slightly improved; mild scattered fibrosis Flat, blunted foveal reflex, scattered Microaneurysms, scattered IRH - cluster just temporal to fovea, temporal macula very ischemic, mild interval inc in edema/cystic changes   Vessels severe Vascular attenuation, sclerotic arterioles, +AV crossing changes, Copper wiring, focal fibrosis superior to disc Vascular attenuation, mild Tortuousity   Periphery Attached, 360 PRP,  good posterior laser fill in, scattered IRH Attached, good 360 PRP in place, [facility]        IMAGING AND PROCEDURES  Imaging and Procedures for _0 @  OCT, Retina - OU - Both Eyes       Right Eye Quality was good. Central Foveal Thickness: 238. Progression has been stable. Findings include abnormal foveal contour, epiretinal membrane, no SRF, outer retinal atrophy, intraretinal fluid, macular pucker, intraretinal hyper-reflective material (persistent IRF ST macula).   Left Eye Quality was good. Central Foveal Thickness: 262. Progression has worsened. Findings include abnormal foveal contour, intraretinal fluid, no SRF, epiretinal membrane, outer retinal atrophy, inner retinal atrophy (Interval increase in IRF temporal macula).   Notes *Images captured and stored on drive  Diagnosis / Impression:  OU: ERM, diffuse atrophy, +DME OD:  persistent IRF ST macula OS:  Interval increase in IRF temporal macula  Clinical management:  See below  Abbreviations: NFP - Normal foveal profile. CME - cystoid macular edema. PED - pigment epithelial detachment. IRF - intraretinal fluid. SRF - subretinal fluid. EZ - ellipsoid zone. ERM - epiretinal membrane. ORA - outer retinal atrophy. ORT - outer retinal tubulation. SRHM - subretinal hyper-reflective material        Intravitreal Injection, Pharmacologic Agent - OD - Right Eye       Time Out 12/30/2019. 2:11 PM. Confirmed correct patient, procedure, site, and patient consented.   Anesthesia Topical anesthesia was used. Anesthetic medications included Lidocaine 2%, Proparacaine 0.5%.   Procedure Preparation included 5% betadine to ocular surface, eyelid speculum. A (32g) needle was used.   Injection:  2 mg aflibercept (EAlfonse FlavorsSOLN   NDC: 6128413-244-01Lot: [id number] date: 03/13/2020   Route: Intravitreal, Site: Right Eye, Waste: 0.05 mL  Post-op Post injection exam found visual acuity of at least counting  fingers. The patient tolerated the procedure well. There were no complications. The patient received written and verbal post procedure care education.        Intravitreal Injection, Pharmacologic Agent - OS - Left Eye       Time Out 12/30/2019. 2:11 PM. Confirmed correct patient, procedure, site, and patient consented.   Anesthesia Topical anesthesia was used. Anesthetic medications included Lidocaine 2%, Proparacaine 0.5%.   Procedure Preparation included 5% betadine to ocular surface, eyelid speculum. A (32g) needle was used.   Injection:  2 mg aflibercept Alfonse Flavors) SOLN   NDC: M7179715, Lot: [id number], Expiration date: 02/12/2020   Route: Intravitreal, Site: Left Eye, Waste: 0.05 mL  Post-op Post injection exam found visual acuity of at least counting fingers. The patient tolerated the procedure well. There were no complications. The patient received written and verbal post procedure care education.                 ASSESSMENT/PLAN:    ICD-10-CM   1. Proliferative diabetic retinopathy of both eyes with macular edema associated with type 2 diabetes mellitus (HCC)  H41.9379 Intravitreal Injection, Pharmacologic Agent - OD - Right Eye    Intravitreal Injection, Pharmacologic Agent - OS - Left Eye    aflibercept (EYLEA) SOLN 2 mg    aflibercept (EYLEA) SOLN 2 mg  2. Retinal edema  H35.81 OCT, Retina - OU - Both Eyes  3. Epiretinal membrane (ERM) of both eyes  H35.373   4. Essential hypertension  I10   5. Hypertensive retinopathy of both eyes  H35.033   6. Pseudophakia, both eyes  Z96.1   7. Dry eyes  H04.123     1,2. Proliferative diabetic retinopathy w/ DME, OU  - former pt of Dwana Melena at [facility] and Adonis Brook at [place] -- known history of proliferative diabetic retinopathy  - history PPV OS and laser PRP OD with Dr. Manuella Ghazi for PDR OU w/ TRD OS -- 01/25/17  - history of intravitreal anti-VEGF therapy with Dr. Anderson Malta -- last IVE ~01/2018  - s/p PRP fill  in OD (02.27.20) -- good fill in laser in place  - s/p IVA OU #1 (02.13.20), #2 (03.12.20), #3 (05.10.20), #4 (06.08.20), #5 (07.06.20), #6 (08.03.20), #7 (08.31.20), #8 (09.28.20), #9 (10.26.20)  - ?resistance to IVA  - s/p IVE OU #1 (11.23.20), #2 (01.22.21), #3 (03.05.21), #4 (04.02.21), #5 (04.30.21), #6 (06.07.21)  - exam shows stable regression of fine NVD OD, PRP laser in place OU  - FA (08.31.20) shows retinal NV vastly improved OD; significant vascular perfusion defects and increased FAZ OU; late leaking MA OU  - OCT shows OD:  persistent IRF ST macula; OS:  Interval increase in IRF temporal macula  - BCVA 20/30 OU  - recommend IVE OU #7 today (07.19.21) w/ f/u in 5 wks  - pt wishes to proceed  - RBA of procedure discussed, questions answered  - informed consent obtained   - Eylea informed consent form signed and scanned on 01.22.2021  - see procedure note  - Eylea4U Benefits Investigation initiated 10.26.2020 -- approved as of 11.23.20 and verified for 2021  - [facility]ACE has authorized 6 months of injections as of May 2021  - f/u 5 weeks, DFE/OCT/likely injection OU  3. Epiretinal membrane, both eyes   - mild ERM  - asymptomatic, no metamorphopsia  - no indication for surgery at this time  - monitor for now  4,5. Hypertensive retinopathy OU  - discussed importance of tight BP control  - monitor  6. Pseudophakia OU  - s/p CE/IOL OU with expert surgeon, Dr. Kathlen Mody (OD: 2.25.21, OS: 03.24.21)  - beautiful surgeries, doing well  - post op drops per Dr. Kathlen Mody  - monitor  7. Dry eyes OU  - improving  - recommend artificial tears and lubricating ointment as needed  Ophthalmic Meds Ordered this visit:  Meds ordered this encounter  Medications  . aflibercept (EYLEA) SOLN 2 mg  . aflibercept (EYLEA) SOLN 2 mg       Return in about 5 weeks (around 02/03/2020) for f/u PDR OU, DFE, OCT.  There are no Patient Instructions on file for this visit.   This document serves as a  record of services personally performed by Gardiner Sleeper, MD, PhD. It was created on their behalf by Leonie Douglas, an ophthalmic technician. The creation of this record is the provider's dictation and/or activities during the visit.    Electronically signed by: Leonie Douglas COA, 12/30/19  2:33 PM   Gardiner Sleeper, M.D., Ph.D. Diseases & Surgery of the Retina and Vitreous [facility]  I have reviewed the above documentation for accuracy and completeness, and I agree with the above. Gardiner Sleeper, M.D., Ph.D. 12/30/19 2:33 PM   Abbreviations: M myopia (nearsighted); A astigmatism; H hyperopia (farsighted); P presbyopia; Mrx spectacle prescription;  CTL contact lenses; OD right eye; OS left eye; OU both eyes  XT exotropia; ET esotropia; PEK punctate epithelial keratitis; PEE punctate epithelial erosions; DES dry eye syndrome; MGD meibomian gland dysfunction; ATs artificial tears; PFAT's preservative free artificial tears; [place] nuclear sclerotic cataract; PSC posterior subcapsular cataract; ERM epi-retinal membrane; PVD posterior vitreous detachment; RD retinal detachment; DM diabetes mellitus; DR diabetic retinopathy; NPDR non-proliferative diabetic retinopathy; PDR proliferative diabetic retinopathy; CSME clinically significant macular edema; DME diabetic macular edema; dbh dot blot hemorrhages; CWS cotton wool spot; POAG primary open angle glaucoma; C/D cup-to-disc ratio; HVF humphrey visual field; GVF goldmann visual field; OCT optical coherence tomography; IOP intraocular pressure; BRVO Branch retinal vein occlusion; CRVO central retinal vein occlusion; CRAO central retinal artery occlusion; BRAO branch retinal artery occlusion; RT retinal tear; SB scleral buckle; PPV pars plana vitrectomy; VH Vitreous hemorrhage; PRP panretinal laser photocoagulation; IVK intravitreal kenalog; VMT vitreomacular traction; MH Macular hole;  NVD neovascularization of the disc; NVE  neovascularization elsewhere; AREDS age related eye disease study; ARMD age related macular degeneration; POAG primary open angle glaucoma; EBMD epithelial/anterior basement membrane dystrophy; ACIOL anterior chamber intraocular lens; IOL intraocular lens; PCIOL posterior chamber intraocular lens; Phaco/IOL phacoemulsification with intraocular lens placement; [place] photorefractive keratectomy; LASIK laser assisted in situ keratomileusis; HTN hypertension; DM diabetes mellitus; COPD chronic obstructive pulmonary disease

## 2019-12-30 ENCOUNTER — Ambulatory Visit (INDEPENDENT_AMBULATORY_CARE_PROVIDER_SITE_OTHER): Payer: Medicare (Managed Care) | Admitting: Ophthalmology

## 2019-12-30 ENCOUNTER — Other Ambulatory Visit: Payer: Self-pay

## 2019-12-30 ENCOUNTER — Encounter (INDEPENDENT_AMBULATORY_CARE_PROVIDER_SITE_OTHER): Payer: Self-pay | Admitting: Ophthalmology

## 2019-12-30 DIAGNOSIS — E113513 Type 2 diabetes mellitus with proliferative diabetic retinopathy with macular edema, bilateral: Secondary | ICD-10-CM

## 2019-12-30 DIAGNOSIS — H3581 Retinal edema: Secondary | ICD-10-CM

## 2019-12-30 DIAGNOSIS — H04123 Dry eye syndrome of bilateral lacrimal glands: Secondary | ICD-10-CM

## 2019-12-30 DIAGNOSIS — H35373 Puckering of macula, bilateral: Secondary | ICD-10-CM

## 2019-12-30 DIAGNOSIS — I10 Essential (primary) hypertension: Secondary | ICD-10-CM | POA: Diagnosis not present

## 2019-12-30 DIAGNOSIS — Z961 Presence of intraocular lens: Secondary | ICD-10-CM

## 2019-12-30 DIAGNOSIS — H35033 Hypertensive retinopathy, bilateral: Secondary | ICD-10-CM

## 2019-12-30 MED ORDER — AFLIBERCEPT 2MG/0.05ML IZ SOLN FOR KALEIDOSCOPE
2.0000 mg | INTRAVITREAL | Status: AC | PRN
  Administered 2019-12-30: 2 mg via INTRAVITREAL

## 2020-02-04 NOTE — Progress Notes (Signed)
[facility] Note  02/05/2020     CHIEF COMPLAINT Patient presents for Retina Follow Up   HISTORY OF PRESENT ILLNESS: John Jacobson is a 64 y.o. male who presents to the clinic today for:   HPI    Retina Follow Up    Patient presents with  Diabetic Retinopathy.  In both eyes.  Duration of 5 weeks.  Since onset it is stable.  I, the attending physician,  performed the HPI with the patient and updated documentation appropriately.          Comments    5 week follow up PDR OU- Vision appears stable OU.  Using Clear Eyes BID OU or prn.   A1C: 7.2  Doesn't check BS       Last edited by Bernarda Caffey, MD on 02/05/2020 11:46 AM. (History)    patient states vision is stable  Referring physician: [facility], [address] [place],  [place] [address]  HISTORICAL INFORMATION:   Selected notes from the MEDICAL RECORD NUMBER Referred by Dr. Dorian Pod [facility]) for DM exam LEE:  Ocular Hx- PMH-    CURRENT MEDICATIONS: Current Outpatient Medications (Ophthalmic Drugs)  Medication Sig  . carboxymethylcellulose 1 % ophthalmic solution Place 1 drop into both eyes 4 (four) times daily.   No current facility-administered medications for this visit. (Ophthalmic Drugs)   Current Outpatient Medications (Other)  Medication Sig  . acetaminophen (TYLENOL) 500 MG tablet Take 500 mg by mouth 2 (two) times daily as needed (for pain or headaches).  Marland Kitchen amoxicillin-clavulanate (AUGMENTIN) 875-125 MG tablet Take 1 tablet by mouth every 12 (twelve) hours.  Marland Kitchen aspirin EC 325 MG EC tablet Take 1 tablet (325 mg total) by mouth daily.  Marland Kitchen atorvastatin (LIPITOR) 20 MG tablet Take 20 mg by mouth daily.  . carvedilol (COREG) 25 MG tablet Take 12.5 mg by mouth every 12 (twelve) hours.  . Cholecalciferol (VITAMIN D3) 2000 units TABS Take 4,000 Units by mouth daily.  . cloNIDine (CATAPRES) 0.3 MG tablet Take 1 tablet (0.3 mg total) by  mouth 3 (three) times daily.  . clopidogrel (PLAVIX) 75 MG tablet Take 75 mg by mouth daily.  . Cyanocobalamin (VITAMIN B-12 PO) Take 1 tablet by mouth daily.  Marland Kitchen doxazosin (CARDURA) 8 MG tablet Take 4 mg by mouth 2 (two) times daily.  . ferrous sulfate 325 (65 FE) MG tablet Take 325 mg by mouth every Monday, Wednesday, and Friday.  . furosemide (LASIX) 40 MG tablet Take 0.5 tablets (20 mg total) by mouth daily.  Marland Kitchen glucose 4 GM chewable tablet Chew 1 tablet by mouth daily as needed for low blood sugar.  . hydrALAZINE (APRESOLINE) 100 MG tablet Take 1 tablet (100 mg total) by mouth 3 (three) times daily.  Marland Kitchen HYDROcodone-acetaminophen (NORCO/VICODIN) 5-325 MG tablet Take 1 tablet by mouth every 6 (six) hours as needed.  . insulin glargine (LANTUS) 100 unit/mL SOPN Inject 0.3 mLs (30 Units total) into the skin at bedtime.  . isosorbide mononitrate (IMDUR) 60 MG 24 hr tablet Take 1 tablet (60 mg total) by mouth daily.  . montelukast (SINGULAIR) 10 MG tablet Take 10 mg by mouth at bedtime.  . polyethylene glycol (MIRALAX / GLYCOLAX) packet Take 17 g by mouth daily. (Patient taking differently: Take 17 g by mouth daily as needed for mild constipation. )  . potassium chloride (K-DUR) 10 MEQ tablet Take 1 tablet (10 mEq total) by  mouth daily.  Marland Kitchen senna (SENOKOT) 8.6 MG TABS tablet Take 1 tablet (8.6 mg total) by mouth daily. (Patient taking differently: Take 1 tablet by mouth daily as needed for mild constipation. )  . Skin Protectants, Misc. (EUCERIN) cream Apply 1 application topically See admin instructions. Apply to both feet daily after showering   Current Facility-Administered Medications (Other)  Medication Route  . Bevacizumab (AVASTIN) SOLN 1.25 mg Intravitreal  . Bevacizumab (AVASTIN) SOLN 1.25 mg Intravitreal  . Bevacizumab (AVASTIN) SOLN 1.25 mg Intravitreal  . Bevacizumab (AVASTIN) SOLN 1.25 mg Intravitreal      REVIEW OF SYSTEMS: ROS    Positive for: Neurological, Genitourinary,  Endocrine, Cardiovascular, Eyes   Negative for: Constitutional, Gastrointestinal, Skin, Musculoskeletal, HENT, Respiratory, Psychiatric, Allergic/Imm, Heme/Lymph   Last edited by Leonie Douglas, COA on 02/05/2020  9:39 AM. (History)       ALLERGIES Allergies  Allergen Reactions  . Amlodipine Besy-Benazepril Hcl Anaphylaxis, Shortness Of Breath and Swelling    Mouth and tongue swelling  . Shellfish Allergy Anaphylaxis, Shortness Of Breath and Swelling    PAST MEDICAL HISTORY Past Medical History:  Diagnosis Date  . Asthma   . Back pain   . CHF (congestive heart failure) ([place])   . Chronic kidney disease   . Diabetic retinopathy ([place])    PDR OU  . DM (diabetes mellitus) ([place])   . Hepatitis C   . HTN (hypertension)   . Hypertensive retinopathy    OU  . RBBB (right bundle branch block)   . Stroke [facility]) 2018   Past Surgical History:  Procedure Laterality Date  . BACK SURGERY  1995  . CATARACT EXTRACTION Bilateral   . EYE SURGERY Bilateral    Cat Sx  . PARS PLANA VITRECTOMY Left 01/25/2017  . TOE AMPUTATION  2016    FAMILY HISTORY Family History  Problem Relation Age of Onset  . Coronary artery disease Mother        MI in her 19s  . Diabetes Mother   . Macular degeneration Mother   . Hypertension Other   . Diabetes Other   . Alzheimer's disease Other   . Coronary artery disease Brother   . Diabetes Brother   . Diabetes Sister     SOCIAL HISTORY Social History   Tobacco Use  . Smoking status: Former Smoker    Quit date: 01/07/2018    Years since quitting: 2.0  . Smokeless tobacco: Never Used  Substance Use Topics  . Alcohol use: No    Comment: Few beers every other day hx  . Drug use: No         OPHTHALMIC EXAM:  Base Eye Exam    Visual Acuity (Snellen - Linear)      Right Left   Dist [place] 20/30 +1 20/30   Dist ph [place] NI NI       Tonometry (Tonopen, 9:44 AM)      Right Left   Pressure 18 15       Pupils      Dark Light Shape React APD    Right 2 1 Round Minimal None   Left 2 1 Round Minimal None       Visual Fields (Counting fingers)      Left Right    Full Full       Extraocular Movement      Right Left    Full Full       Neuro/Psych    Oriented x3: Yes   Mood/Affect:  Normal       Dilation    Both eyes: 1.0% Mydriacyl, 2.5% Phenylephrine @ 9:44 AM        Slit Lamp and Fundus Exam    Slit Lamp Exam      Right Left   Lids/Lashes Dermatochalasis - upper lid, mild Meibomian gland dysfunction Dermatochalasis - upper lid, mild Meibomian gland dysfunction   Conjunctiva/Sclera nasal Pinguecula, Melanosis nasal/temporal Pinguecula, Melanosis   Cornea 1+ Punctate epithelial erosions, well healed temporal cataract wounds, Debris in tear film Mild Debris in tear film, 1+ Punctate epithelial erosions nasal and inferior, well healed cataract wounds   Anterior Chamber deep, narrow temporal angle, no cell or flare deep, narrow temporal angle; no cell/flare   Iris Round and poorly dilated to 5.58mm, No NVI Round and poorly dilated to 5.65mm, No NVI   Lens PC IOL in good position PC IOL in good position, mild PC folds   Vitreous Vitreous syneresis post vitrectomy       Fundus Exam      Right Left   Disc mild pallor, sharp rim mild pallor, sharp rim   C/D Ratio 0.4 0.5   Macula Blunted foveal reflex, scattered MA/DBH, +Epiretinal membrane, +edema/cystic changes temporal macula - improving; scattered exudates - persistent / slightly improved; mild scattered fibrosis Flat, blunted foveal reflex, scattered Microaneurysms, scattered IRH - cluster just temporal to fovea, temporal macula very ischemic, persistent edema/cystic changes -- improved centrally   Vessels severe Vascular attenuation, sclerotic arterioles, +AV crossing changes, Copper wiring, focal fibrosis superior to disc Vascular attenuation, mild Tortuousity   Periphery Attached, 360 PRP, good posterior laser fill in, scattered IRH Attached, good 360 PRP in place,  scattered [facility]          IMAGING AND PROCEDURES  Imaging and Procedures for @TODAY @  OCT, Retina - OU - Both Eyes       Right Eye Quality was good. Central Foveal Thickness: 233. Progression has improved. Findings include abnormal foveal contour, epiretinal membrane, no SRF, outer retinal atrophy, intraretinal fluid, macular pucker, intraretinal hyper-reflective material (Mild interval improvement in IRF ST macula).   Left Eye Quality was good. Central Foveal Thickness: 256. Findings include abnormal foveal contour, intraretinal fluid, no SRF, epiretinal membrane, outer retinal atrophy, inner retinal atrophy (cystic changes improved centrally; mild interval increase in IRF temporal macula).   Notes *Images captured and stored on drive  Diagnosis / Impression:  OU: ERM, diffuse atrophy, +DME OD:  Mild interval improvement in IRF ST macula OS:  cystic changes improved centrally; mild interval increase in IRF temporal macula  Clinical management:  See below  Abbreviations: NFP - Normal foveal profile. CME - cystoid macular edema. PED - pigment epithelial detachment. IRF - intraretinal fluid. SRF - subretinal fluid. EZ - ellipsoid zone. ERM - epiretinal membrane. ORA - outer retinal atrophy. ORT - outer retinal tubulation. SRHM - subretinal hyper-reflective material        Intravitreal Injection, Pharmacologic Agent - OD - Right Eye       Time Out 02/05/2020. 10:54 AM. Confirmed correct patient, procedure, site, and patient consented.   Anesthesia Topical anesthesia was used. Anesthetic medications included Lidocaine 2%, Proparacaine 0.5%.   Procedure Preparation included 5% betadine to ocular surface, eyelid speculum. A (32g) needle was used.   Injection:  2 mg aflibercept Alfonse Flavors) SOLN   NDC: A3590391, Lot: [id number], Expiration date: 04/13/2020   Route: Intravitreal, Site: Right Eye, Waste: 0.05 mL  Post-op Post injection exam found visual acuity of at least  counting fingers. The patient tolerated the procedure well. There were no complications. The patient received written and verbal post procedure care education.        Intravitreal Injection, Pharmacologic Agent - OS - Left Eye       Time Out 02/05/2020. 10:55 AM. Confirmed correct patient, procedure, site, and patient consented.   Anesthesia Topical anesthesia was used. Anesthetic medications included Lidocaine 2%, Proparacaine 0.5%.   Procedure Preparation included 5% betadine to ocular surface, eyelid speculum. A (32g) needle was used.   Injection:  2 mg aflibercept Alfonse Flavors) SOLN   NDC: M7179715, Lot: [id number], Expiration date: 11/11/2020   Route: Intravitreal, Site: Left Eye, Waste: 0.05 mL  Post-op Post injection exam found visual acuity of at least counting fingers. The patient tolerated the procedure well. There were no complications. The patient received written and verbal post procedure care education.                 ASSESSMENT/PLAN:    ICD-10-CM   1. Proliferative diabetic retinopathy of both eyes with macular edema associated with type 2 diabetes mellitus (HCC)  N23.5573 Intravitreal Injection, Pharmacologic Agent - OD - Right Eye    Intravitreal Injection, Pharmacologic Agent - OS - Left Eye    aflibercept (EYLEA) SOLN 2 mg    aflibercept (EYLEA) SOLN 2 mg  2. Retinal edema  H35.81 OCT, Retina - OU - Both Eyes  3. Epiretinal membrane (ERM) of both eyes  H35.373   4. Essential hypertension  I10   5. Hypertensive retinopathy of both eyes  H35.033   6. Pseudophakia, both eyes  Z96.1   7. Dry eyes  H04.123     1,2. Proliferative diabetic retinopathy w/ DME, OU  - former pt of Dwana Melena at [facility] and Adonis Brook at [place] -- known history of proliferative diabetic retinopathy  - history PPV OS and laser PRP OD with Dr. Manuella Ghazi for PDR OU w/ TRD OS -- 01/25/17  - history of intravitreal anti-VEGF therapy with Dr. Anderson Malta -- last IVE ~01/2018  -  s/p PRP fill in OD (02.27.20) -- good fill in laser in place  - s/p IVA OU #1 (02.13.20), #2 (03.12.20), #3 (05.10.20), #4 (06.08.20), #5 (07.06.20), #6 (08.03.20), #7 (08.31.20), #8 (09.28.20), #9 (10.26.20)  - ?resistance to IVA  - s/p IVE OU #1 (11.23.20), #2 (01.22.21), #3 (03.05.21), #4 (04.02.21), #5 (04.30.21), #6 (06.07.21), #7 (07.19.21)  - exam shows stable regression of fine NVD OD, PRP laser in place OU  - FA (08.31.20) shows retinal NV vastly improved OD; significant vascular perfusion defects and increased FAZ OU; late leaking MA OU  - OCT shows OD: Mild interval improvement in IRF ST macula ; OS: cystic changes improved centrally; mild interval increase in IRF temporal macula  - BCVA 20/30 OU  - recommend IVE OU #8 today (08.25.21)  - pt wishes to proceed  - RBA of procedure discussed, questions answered  - informed consent obtained   - Eylea informed consent form signed and scanned on 01.22.2021  - see procedure note  - Eylea4U Benefits Investigation initiated 10.26.2020 -- approved as of 11.23.20 and verified for 2021  - [facility] has authorized 6 months of injections as of May 2021  - f/u 5 weeks, DFE/OCT/likely injection OU  3. Epiretinal membrane, both eyes   - mild ERM  - asymptomatic, no metamorphopsia  - no indication for surgery at this time  - monitor for now  4,5. Hypertensive retinopathy OU  - discussed  importance of tight BP control  - monitor  6. Pseudophakia OU  - s/p CE/IOL OU with expert surgeon, Dr. Kathlen Mody (OD: 2.25.21, OS: 03.24.21)  - beautiful surgeries, doing well  - post op drops per Dr. Kathlen Mody  - monitor  7. Dry eyes OU  - improving  - recommend artificial tears and lubricating ointment as needed  Ophthalmic Meds Ordered this visit:  Meds ordered this encounter  Medications  . aflibercept (EYLEA) SOLN 2 mg  . aflibercept (EYLEA) SOLN 2 mg       Return in about 5 weeks (around 03/11/2020) for f/u PDR OU, DFE, OCT.  There are no Patient  Instructions on file for this visit.   This document serves as a record of services personally performed by Gardiner Sleeper, MD, PhD. It was created on their behalf by Roselee Nova, COMT. The creation of this record is the provider's dictation and/or activities during the visit.  Electronically signed by: Roselee Nova, COMT 02/05/20 12:01 PM   Gardiner Sleeper, M.D., Ph.D. Diseases & Surgery of the Retina and Vitreous [facility]  I have reviewed the above documentation for accuracy and completeness, and I agree with the above. Gardiner Sleeper, M.D., Ph.D. 02/05/20 12:01 PM    Abbreviations: M myopia (nearsighted); A astigmatism; H hyperopia (farsighted); P presbyopia; Mrx spectacle prescription;  CTL contact lenses; OD right eye; OS left eye; OU both eyes  XT exotropia; ET esotropia; PEK punctate epithelial keratitis; PEE punctate epithelial erosions; DES dry eye syndrome; MGD meibomian gland dysfunction; ATs artificial tears; PFAT's preservative free artificial tears; [place] nuclear sclerotic cataract; PSC posterior subcapsular cataract; ERM epi-retinal membrane; PVD posterior vitreous detachment; RD retinal detachment; DM diabetes mellitus; DR diabetic retinopathy; NPDR non-proliferative diabetic retinopathy; PDR proliferative diabetic retinopathy; CSME clinically significant macular edema; DME diabetic macular edema; dbh dot blot hemorrhages; CWS cotton wool spot; POAG primary open angle glaucoma; C/D cup-to-disc ratio; HVF humphrey visual field; GVF goldmann visual field; OCT optical coherence tomography; IOP intraocular pressure; BRVO Branch retinal vein occlusion; CRVO central retinal vein occlusion; CRAO central retinal artery occlusion; BRAO branch retinal artery occlusion; RT retinal tear; SB scleral buckle; PPV pars plana vitrectomy; VH Vitreous hemorrhage; PRP panretinal laser photocoagulation; IVK intravitreal kenalog; VMT vitreomacular traction; MH Macular hole;  NVD  neovascularization of the disc; NVE neovascularization elsewhere; AREDS age related eye disease study; ARMD age related macular degeneration; POAG primary open angle glaucoma; EBMD epithelial/anterior basement membrane dystrophy; ACIOL anterior chamber intraocular lens; IOL intraocular lens; PCIOL posterior chamber intraocular lens; Phaco/IOL phacoemulsification with intraocular lens placement; [place] photorefractive keratectomy; LASIK laser assisted in situ keratomileusis; HTN hypertension; DM diabetes mellitus; COPD chronic obstructive pulmonary disease

## 2020-02-05 ENCOUNTER — Other Ambulatory Visit: Payer: Self-pay

## 2020-02-05 ENCOUNTER — Ambulatory Visit (INDEPENDENT_AMBULATORY_CARE_PROVIDER_SITE_OTHER): Payer: Medicare (Managed Care) | Admitting: Ophthalmology

## 2020-02-05 ENCOUNTER — Encounter (INDEPENDENT_AMBULATORY_CARE_PROVIDER_SITE_OTHER): Payer: Self-pay | Admitting: Ophthalmology

## 2020-02-05 DIAGNOSIS — H04123 Dry eye syndrome of bilateral lacrimal glands: Secondary | ICD-10-CM

## 2020-02-05 DIAGNOSIS — E113513 Type 2 diabetes mellitus with proliferative diabetic retinopathy with macular edema, bilateral: Secondary | ICD-10-CM

## 2020-02-05 DIAGNOSIS — H35373 Puckering of macula, bilateral: Secondary | ICD-10-CM

## 2020-02-05 DIAGNOSIS — H3581 Retinal edema: Secondary | ICD-10-CM

## 2020-02-05 DIAGNOSIS — Z961 Presence of intraocular lens: Secondary | ICD-10-CM

## 2020-02-05 DIAGNOSIS — I10 Essential (primary) hypertension: Secondary | ICD-10-CM | POA: Diagnosis not present

## 2020-02-05 DIAGNOSIS — H35033 Hypertensive retinopathy, bilateral: Secondary | ICD-10-CM

## 2020-02-05 MED ORDER — AFLIBERCEPT 2MG/0.05ML IZ SOLN FOR KALEIDOSCOPE
2.0000 mg | INTRAVITREAL | Status: AC | PRN
  Administered 2020-02-05: 2 mg via INTRAVITREAL

## 2020-03-11 ENCOUNTER — Encounter (INDEPENDENT_AMBULATORY_CARE_PROVIDER_SITE_OTHER): Payer: Medicare (Managed Care) | Admitting: Ophthalmology

## 2020-03-16 NOTE — Progress Notes (Signed)
[facility] Note  03/18/2020     CHIEF COMPLAINT Patient presents for Retina Follow Up   HISTORY OF PRESENT ILLNESS: John Jacobson is a 64 y.o. male who presents to the clinic today for:   HPI    Retina Follow Up    Patient presents with  Diabetic Retinopathy.  In both eyes.  This started 5 weeks ago.  I, the attending physician,  performed the HPI with the patient and updated documentation appropriately.          Comments    Patient here for 5 weeks retina follow up for PDR OU. Patient states vision doing pretty good. No eye pain.       Last edited by Bernarda Caffey, MD on 03/18/2020 12:59 PM. (History)    patient feels like his right eye vision is down, but left eye vision is good  Referring physician: No referring provider defined for this encounter.  HISTORICAL INFORMATION:   Selected notes from the MEDICAL RECORD NUMBER Referred by Dr. Dorian Pod [facility]) for DM exam LEE:  Ocular Hx- PMH-    CURRENT MEDICATIONS: Current Outpatient Medications (Ophthalmic Drugs)  Medication Sig  . carboxymethylcellulose 1 % ophthalmic solution Place 1 drop into both eyes 4 (four) times daily.   No current facility-administered medications for this visit. (Ophthalmic Drugs)   Current Outpatient Medications (Other)  Medication Sig  . acetaminophen (TYLENOL) 500 MG tablet Take 500 mg by mouth 2 (two) times daily as needed (for pain or headaches).  Marland Kitchen amoxicillin-clavulanate (AUGMENTIN) 875-125 MG tablet Take 1 tablet by mouth every 12 (twelve) hours.  Marland Kitchen aspirin EC 325 MG EC tablet Take 1 tablet (325 mg total) by mouth daily.  Marland Kitchen atorvastatin (LIPITOR) 20 MG tablet Take 20 mg by mouth daily.  . carvedilol (COREG) 25 MG tablet Take 12.5 mg by mouth every 12 (twelve) hours.  . Cholecalciferol (VITAMIN D3) 2000 units TABS Take 4,000 Units by mouth daily.  . cloNIDine (CATAPRES) 0.3 MG tablet Take 1 tablet (0.3 mg total) by mouth 3 (three)  times daily.  . clopidogrel (PLAVIX) 75 MG tablet Take 75 mg by mouth daily.  . Cyanocobalamin (VITAMIN B-12 PO) Take 1 tablet by mouth daily.  Marland Kitchen doxazosin (CARDURA) 8 MG tablet Take 4 mg by mouth 2 (two) times daily.  . ferrous sulfate 325 (65 FE) MG tablet Take 325 mg by mouth every Monday, Wednesday, and Friday.  . furosemide (LASIX) 40 MG tablet Take 0.5 tablets (20 mg total) by mouth daily.  Marland Kitchen glucose 4 GM chewable tablet Chew 1 tablet by mouth daily as needed for low blood sugar.  . hydrALAZINE (APRESOLINE) 100 MG tablet Take 1 tablet (100 mg total) by mouth 3 (three) times daily.  Marland Kitchen HYDROcodone-acetaminophen (NORCO/VICODIN) 5-325 MG tablet Take 1 tablet by mouth every 6 (six) hours as needed.  . insulin glargine (LANTUS) 100 unit/mL SOPN Inject 0.3 mLs (30 Units total) into the skin at bedtime.  . isosorbide mononitrate (IMDUR) 60 MG 24 hr tablet Take 1 tablet (60 mg total) by mouth daily.  . montelukast (SINGULAIR) 10 MG tablet Take 10 mg by mouth at bedtime.  . polyethylene glycol (MIRALAX / GLYCOLAX) packet Take 17 g by mouth daily. (Patient taking differently: Take 17 g by mouth daily as needed for mild constipation. )  . potassium chloride (K-DUR) 10 MEQ tablet Take 1 tablet (10 mEq total) by mouth daily.  Marland Kitchen senna (SENOKOT) 8.6 MG TABS  tablet Take 1 tablet (8.6 mg total) by mouth daily. (Patient taking differently: Take 1 tablet by mouth daily as needed for mild constipation. )  . Skin Protectants, Misc. (EUCERIN) cream Apply 1 application topically See admin instructions. Apply to both feet daily after showering   Current Facility-Administered Medications (Other)  Medication Route  . Bevacizumab (AVASTIN) SOLN 1.25 mg Intravitreal  . Bevacizumab (AVASTIN) SOLN 1.25 mg Intravitreal  . Bevacizumab (AVASTIN) SOLN 1.25 mg Intravitreal  . Bevacizumab (AVASTIN) SOLN 1.25 mg Intravitreal      REVIEW OF SYSTEMS: ROS    Positive for: Neurological, Genitourinary, Endocrine,  Cardiovascular, Eyes   Negative for: Constitutional, Gastrointestinal, Skin, Musculoskeletal, HENT, Respiratory, Psychiatric, Allergic/Imm, Heme/Lymph   Last edited by Theodore Demark, COA on 03/18/2020  9:31 AM. (History)       ALLERGIES Allergies  Allergen Reactions  . Amlodipine Besy-Benazepril Hcl Anaphylaxis, Shortness Of Breath and Swelling    Mouth and tongue swelling  . Shellfish Allergy Anaphylaxis, Shortness Of Breath and Swelling    PAST MEDICAL HISTORY Past Medical History:  Diagnosis Date  . Asthma   . Back pain   . CHF (congestive heart failure) ([place])   . Chronic kidney disease   . Diabetic retinopathy ([place])    PDR OU  . DM (diabetes mellitus) ([place])   . Hepatitis C   . HTN (hypertension)   . Hypertensive retinopathy    OU  . RBBB (right bundle branch block)   . Stroke [facility]) 2018   Past Surgical History:  Procedure Laterality Date  . BACK SURGERY  1995  . CATARACT EXTRACTION Bilateral   . EYE SURGERY Bilateral    Cat Sx  . PARS PLANA VITRECTOMY Left 01/25/2017  . TOE AMPUTATION  2016    FAMILY HISTORY Family History  Problem Relation Age of Onset  . Coronary artery disease Mother        MI in her 75s  . Diabetes Mother   . Macular degeneration Mother   . Hypertension Other   . Diabetes Other   . Alzheimer's disease Other   . Coronary artery disease Brother   . Diabetes Brother   . Diabetes Sister     SOCIAL HISTORY Social History   Tobacco Use  . Smoking status: Former Smoker    Quit date: 01/07/2018    Years since quitting: 2.1  . Smokeless tobacco: Never Used  Substance Use Topics  . Alcohol use: No    Comment: Few beers every other day hx  . Drug use: No         OPHTHALMIC EXAM:  Base Eye Exam    Visual Acuity (Snellen - Linear)      Right Left   Dist [place] 20/50 -1 20/30   Dist ph [place] 20/40 -2 NI       Tonometry (Tonopen, 9:28 AM)      Right Left   Pressure 11 11       Pupils      Dark Light Shape React APD   Right  2 1 Round Minimal None   Left 2 1 Round Minimal None       Visual Fields (Counting fingers)      Left Right    Full Full       Extraocular Movement      Right Left    Full Full       Neuro/Psych    Oriented x3: Yes   Mood/Affect: Normal  Dilation    Both eyes: 1.0% Mydriacyl, 2.5% Phenylephrine @ 9:28 AM        Slit Lamp and Fundus Exam    Slit Lamp Exam      Right Left   Lids/Lashes Dermatochalasis - upper lid, mild Meibomian gland dysfunction Dermatochalasis - upper lid, mild Meibomian gland dysfunction   Conjunctiva/Sclera nasal Pinguecula, Melanosis nasal/temporal Pinguecula, Melanosis   Cornea 1+ Punctate epithelial erosions, well healed temporal cataract wounds, Debris in tear film Mild Debris in tear film, 1+ Punctate epithelial erosions nasal and inferior, well healed cataract wounds   Anterior Chamber deep, narrow temporal angle, no cell or flare deep, narrow temporal angle; no cell/flare   Iris Round and poorly dilated to 5.62mm, No NVI Round and poorly dilated to 5.31mm, No NVI   Lens PC IOL in good position PC IOL in good position, mild PC folds   Vitreous Vitreous syneresis post vitrectomy       Fundus Exam      Right Left   Disc mild pallor, sharp rim mild pallor, sharp rim   C/D Ratio 0.4 0.5   Macula Blunted foveal reflex, scattered MA/DBH, +Epiretinal membrane, +edema/cystic changes temporal macula - persistent; scattered exudates - persistent / slightly improved; mild scattered fibrosis Flat, blunted foveal reflex, scattered Microaneurysms, scattered IRH - cluster just temporal to fovea, temporal macula very ischemic, persistent edema/cystic changes    Vessels severe Vascular attenuation, sclerotic arterioles, +AV crossing changes, Copper wiring, focal fibrosis superior to disc Vascular attenuation, mild Tortuousity   Periphery Attached, 360 PRP, good posterior laser fill in, scattered IRH Attached, good 360 PRP in place, scattered [facility]           IMAGING AND PROCEDURES  Imaging and Procedures for @TODAY @  OCT, Retina - OU - Both Eyes       Right Eye Quality was good. Central Foveal Thickness: 236. Progression has been stable. Findings include abnormal foveal contour, epiretinal membrane, no SRF, outer retinal atrophy, intraretinal fluid, macular pucker, intraretinal hyper-reflective material (Persistent cystic changes temporal macula, ?interval progression of ERM).   Left Eye Quality was good. Central Foveal Thickness: 260. Progression has been stable. Findings include abnormal foveal contour, intraretinal fluid, no SRF, epiretinal membrane, outer retinal atrophy, inner retinal atrophy (Mild interval improvement in temporal IRF, but mild interval development of nasal IRF/edema).   Notes *Images captured and stored on drive  Diagnosis / Impression:  OU: ERM, diffuse atrophy, +DME OD:  Persistent cystic changes temporal macula, ?interval progression of ERM OS:  Mild interval improvement in temporal IRF, but mild interval development of nasal IRF/edema  Clinical management:  See below  Abbreviations: NFP - Normal foveal profile. CME - cystoid macular edema. PED - pigment epithelial detachment. IRF - intraretinal fluid. SRF - subretinal fluid. EZ - ellipsoid zone. ERM - epiretinal membrane. ORA - outer retinal atrophy. ORT - outer retinal tubulation. SRHM - subretinal hyper-reflective material        Intravitreal Injection, Pharmacologic Agent - OS - Left Eye       Time Out 03/18/2020. 9:52 AM. Confirmed correct patient, procedure, site, and patient consented.   Anesthesia Topical anesthesia was used. Anesthetic medications included Lidocaine 2%, Proparacaine 0.5%.   Procedure Preparation included 5% betadine to ocular surface, eyelid speculum. A (32g) needle was used.   Injection:  2 mg aflibercept Alfonse Flavors) SOLN   NDC: M7179715, Lot: [id number], Expiration date: 11/11/2020   Route: Intravitreal, Site: Left Eye,  Waste: 0.05 mL  Post-op Post injection exam found  visual acuity of at least counting fingers. The patient tolerated the procedure well. There were no complications. The patient received written and verbal post procedure care education.        Intravitreal Injection, Pharmacologic Agent - OD - Right Eye       Time Out 03/18/2020. 9:57 AM. Confirmed correct patient, procedure, site, and patient consented.   Anesthesia Topical anesthesia was used. Anesthetic medications included Lidocaine 2%, Proparacaine 0.5%.   Procedure Preparation included 5% betadine to ocular surface, eyelid speculum. A (32g) needle was used.   Injection:  2 mg aflibercept Alfonse Flavors) SOLN   NDC: A3590391, Lot: [id number], Expiration date: 06/13/2020   Route: Intravitreal, Site: Right Eye, Waste: 0.05 mL  Post-op Post injection exam found visual acuity of at least counting fingers. The patient tolerated the procedure well. There were no complications. The patient received written and verbal post procedure care education.                 ASSESSMENT/PLAN:    ICD-10-CM   1. Proliferative diabetic retinopathy of both eyes with macular edema associated with type 2 diabetes mellitus (HCC)  Z85.8850 Intravitreal Injection, Pharmacologic Agent - OS - Left Eye    Intravitreal Injection, Pharmacologic Agent - OD - Right Eye    aflibercept (EYLEA) SOLN 2 mg    aflibercept (EYLEA) SOLN 2 mg    CANCELED: Intravitreal Injection, Pharmacologic Agent - OS - Left Eye  2. Retinal edema  H35.81 OCT, Retina - OU - Both Eyes  3. Epiretinal membrane (ERM) of both eyes  H35.373   4. Essential hypertension  I10   5. Hypertensive retinopathy of both eyes  H35.033   6. Pseudophakia, both eyes  Z96.1   7. Dry eyes  H04.123     1,2. Proliferative diabetic retinopathy w/ DME, OU  - former pt of Dwana Melena at [facility] and Adonis Brook at [facility] -- known history of proliferative diabetic retinopathy  - history PPV  OS and laser PRP OD with Dr. Manuella Ghazi for PDR OU w/ TRD OS -- 01/25/17  - history of intravitreal anti-VEGF therapy with Dr. Anderson Malta -- last IVE ~01/2018  - s/p PRP fill in OD (02.27.20) -- good fill in laser in place  - s/p IVA OU #1 (02.13.20), #2 (03.12.20), #3 (05.10.20), #4 (06.08.20), #5 (07.06.20), #6 (08.03.20), #7 (08.31.20), #8 (09.28.20), #9 (10.26.20)  - ?resistance to IVA  - s/p IVE OU #1 (11.23.20), #2 (01.22.21), #3 (03.05.21), #4 (04.02.21), #5 (04.30.21), #6 (06.07.21), #7 (07.19.21), #8 (08.25.21)  - exam shows stable regression of fine NVD OD, PRP laser in place OU  - FA (08.31.20) shows retinal NV vastly improved OD; significant vascular perfusion defects and increased FAZ OU; late leaking MA OU  - OCT shows OD:  Persistent cystic changes temporal macula, ?interval progression of ERM; OS:  Mild interval improvement in temporal IRF, but mild interval development in nasal IRF/edema  - BCVA 20/40 OD 20/30 OS  - recommend IVE OU #9 today (10.06.21)  - pt wishes to proceed  - RBA of procedure discussed, questions answered  - informed consent obtained   - Eylea informed consent form signed and scanned on 01.22.2021  - see procedure note  - Eylea4U Benefits Investigation initiated 10.26.2020 -- approved as of 11.23.20 and verified for 2021  - [facility] has authorized 6 months of injections as of May 2021  - f/u 5 weeks, DFE/OCT/likely injection OU  3. Epiretinal membrane, both eyes   - mild ERM  OU  - OCT w/ mild interval progression of ERM OD (BCVA OD 20/40 from 20/30)  - asymptomatic, no metamorphopsia  - no indication for surgery at this time  - monitor for now  4,5. Hypertensive retinopathy OU  - discussed importance of tight BP control  - monitor  6. Pseudophakia OU  - s/p CE/IOL OU with expert surgeon, Dr. Kathlen Mody (OD: 2.25.21, OS: 03.24.21)  - beautiful surgeries, doing well  - post op drops per Dr. Kathlen Mody  - monitor  7. Dry eyes OU  - improving  - recommend artificial  tears and lubricating ointment as needed  Ophthalmic Meds Ordered this visit:  Meds ordered this encounter  Medications  . aflibercept (EYLEA) SOLN 2 mg  . aflibercept (EYLEA) SOLN 2 mg      Return in about 5 weeks (around 04/22/2020) for f/u PDR OU, DFE, OCT.  There are no Patient Instructions on file for this visit.   This document serves as a record of services personally performed by Gardiner Sleeper, MD, PhD. It was created on their behalf by Roselee Nova, COMT. The creation of this record is the provider's dictation and/or activities during the visit.  Electronically signed by: Roselee Nova, COMT 03/18/20 1:03 PM  Gardiner Sleeper, M.D., Ph.D. Diseases & Surgery of the Retina and [place]  I have reviewed the above documentation for accuracy and completeness, and I agree with the above. Gardiner Sleeper, M.D., Ph.D. 03/18/20 1:03 PM  Abbreviations: M myopia (nearsighted); A astigmatism; H hyperopia (farsighted); P presbyopia; Mrx spectacle prescription;  CTL contact lenses; OD right eye; OS left eye; OU both eyes  XT exotropia; ET esotropia; PEK punctate epithelial keratitis; PEE punctate epithelial erosions; DES dry eye syndrome; MGD meibomian gland dysfunction; ATs artificial tears; PFAT's preservative free artificial tears; [place] nuclear sclerotic cataract; PSC posterior subcapsular cataract; ERM epi-retinal membrane; PVD posterior vitreous detachment; RD retinal detachment; DM diabetes mellitus; DR diabetic retinopathy; NPDR non-proliferative diabetic retinopathy; PDR proliferative diabetic retinopathy; CSME clinically significant macular edema; DME diabetic macular edema; dbh dot blot hemorrhages; CWS cotton wool spot; POAG primary open angle glaucoma; C/D cup-to-disc ratio; HVF humphrey visual field; GVF goldmann visual field; OCT optical coherence tomography; IOP intraocular pressure; BRVO Branch retinal vein occlusion; CRVO central retinal vein  occlusion; CRAO central retinal artery occlusion; BRAO branch retinal artery occlusion; RT retinal tear; SB scleral buckle; PPV pars plana vitrectomy; VH Vitreous hemorrhage; PRP panretinal laser photocoagulation; IVK intravitreal kenalog; VMT vitreomacular traction; MH Macular hole;  NVD neovascularization of the disc; NVE neovascularization elsewhere; AREDS age related eye disease study; ARMD age related macular degeneration; POAG primary open angle glaucoma; EBMD epithelial/anterior basement membrane dystrophy; ACIOL anterior chamber intraocular lens; IOL intraocular lens; PCIOL posterior chamber intraocular lens; Phaco/IOL phacoemulsification with intraocular lens placement; [place] photorefractive keratectomy; LASIK laser assisted in situ keratomileusis; HTN hypertension; DM diabetes mellitus; COPD chronic obstructive pulmonary disease

## 2020-03-18 ENCOUNTER — Ambulatory Visit (INDEPENDENT_AMBULATORY_CARE_PROVIDER_SITE_OTHER): Payer: Medicare (Managed Care) | Admitting: Ophthalmology

## 2020-03-18 ENCOUNTER — Encounter (INDEPENDENT_AMBULATORY_CARE_PROVIDER_SITE_OTHER): Payer: Self-pay | Admitting: Ophthalmology

## 2020-03-18 ENCOUNTER — Other Ambulatory Visit: Payer: Self-pay

## 2020-03-18 DIAGNOSIS — I10 Essential (primary) hypertension: Secondary | ICD-10-CM

## 2020-03-18 DIAGNOSIS — E113513 Type 2 diabetes mellitus with proliferative diabetic retinopathy with macular edema, bilateral: Secondary | ICD-10-CM | POA: Diagnosis not present

## 2020-03-18 DIAGNOSIS — H04123 Dry eye syndrome of bilateral lacrimal glands: Secondary | ICD-10-CM

## 2020-03-18 DIAGNOSIS — H3581 Retinal edema: Secondary | ICD-10-CM | POA: Diagnosis not present

## 2020-03-18 DIAGNOSIS — H35373 Puckering of macula, bilateral: Secondary | ICD-10-CM | POA: Diagnosis not present

## 2020-03-18 DIAGNOSIS — H35033 Hypertensive retinopathy, bilateral: Secondary | ICD-10-CM

## 2020-03-18 DIAGNOSIS — Z961 Presence of intraocular lens: Secondary | ICD-10-CM

## 2020-03-18 MED ORDER — AFLIBERCEPT 2MG/0.05ML IZ SOLN FOR KALEIDOSCOPE
2.0000 mg | INTRAVITREAL | Status: AC | PRN
  Administered 2020-03-18: 2 mg via INTRAVITREAL

## 2020-04-21 NOTE — Progress Notes (Signed)
[facility] Note  04/22/2020     CHIEF COMPLAINT Patient presents for Retina Follow Up   HISTORY OF PRESENT ILLNESS: Nthony A Gregori is a 64 y.o. male who presents to the clinic today for:   HPI    Retina Follow Up    Patient presents with  Diabetic Retinopathy.  In both eyes.  Duration of 5 weeks.  Since onset it is stable.  I, the attending physician,  performed the HPI with the patient and updated documentation appropriately.          Comments    5 week follow up PDR OU-  Since last visit pt noticed a "floater" that has since went away.  He thinks it was in his left eye but unsure.  Denies FOLs or vision changes. Uses ATs 1-3 x/d BS: Pace checks for him q1 week.  Unsure what the reading are when checked.  A1C 7.2       Last edited by Bernarda Caffey, MD on 04/22/2020 11:22 AM. (History)     Patient states  Referring physician: No referring provider defined for this encounter.  HISTORICAL INFORMATION:   Selected notes from the MEDICAL RECORD NUMBER Referred by Dr. Dorian Pod [facility]) for DM exam LEE:  Ocular Hx- PMH-    CURRENT MEDICATIONS: Current Outpatient Medications (Ophthalmic Drugs)  Medication Sig  . carboxymethylcellulose 1 % ophthalmic solution Place 1 drop into both eyes 4 (four) times daily. (Patient not taking: Reported on 04/22/2020)   No current facility-administered medications for this visit. (Ophthalmic Drugs)   Current Outpatient Medications (Other)  Medication Sig  . acetaminophen (TYLENOL) 500 MG tablet Take 500 mg by mouth 2 (two) times daily as needed (for pain or headaches).  Marland Kitchen aspirin EC 325 MG EC tablet Take 1 tablet (325 mg total) by mouth daily.  Marland Kitchen atorvastatin (LIPITOR) 20 MG tablet Take 20 mg by mouth daily.  . carvedilol (COREG) 25 MG tablet Take 12.5 mg by mouth every 12 (twelve) hours.  . Cholecalciferol (VITAMIN D3) 2000 units TABS Take 4,000 Units by mouth daily.  . cloNIDine  (CATAPRES) 0.3 MG tablet Take 1 tablet (0.3 mg total) by mouth 3 (three) times daily.  . clopidogrel (PLAVIX) 75 MG tablet Take 75 mg by mouth daily.  . Cyanocobalamin (VITAMIN B-12 PO) Take 1 tablet by mouth daily.  Marland Kitchen doxazosin (CARDURA) 8 MG tablet Take 4 mg by mouth 2 (two) times daily.  . ferrous sulfate 325 (65 FE) MG tablet Take 325 mg by mouth every Monday, Wednesday, and Friday.  . furosemide (LASIX) 40 MG tablet Take 0.5 tablets (20 mg total) by mouth daily.  Marland Kitchen glucose 4 GM chewable tablet Chew 1 tablet by mouth daily as needed for low blood sugar.  . hydrALAZINE (APRESOLINE) 100 MG tablet Take 1 tablet (100 mg total) by mouth 3 (three) times daily.  Marland Kitchen HYDROcodone-acetaminophen (NORCO/VICODIN) 5-325 MG tablet Take 1 tablet by mouth every 6 (six) hours as needed.  . insulin glargine (LANTUS) 100 unit/mL SOPN Inject 0.3 mLs (30 Units total) into the skin at bedtime.  . montelukast (SINGULAIR) 10 MG tablet Take 10 mg by mouth at bedtime.  . polyethylene glycol (MIRALAX / GLYCOLAX) packet Take 17 g by mouth daily. (Patient taking differently: Take 17 g by mouth daily as needed for mild constipation. )  . potassium chloride (K-DUR) 10 MEQ tablet Take 1 tablet (10 mEq total) by mouth daily.  Marland Kitchen senna (SENOKOT) 8.6  MG TABS tablet Take 1 tablet (8.6 mg total) by mouth daily. (Patient taking differently: Take 1 tablet by mouth daily as needed for mild constipation. )  . Skin Protectants, Misc. (EUCERIN) cream Apply 1 application topically See admin instructions. Apply to both feet daily after showering  . amoxicillin-clavulanate (AUGMENTIN) 875-125 MG tablet Take 1 tablet by mouth every 12 (twelve) hours. (Patient not taking: Reported on 04/22/2020)  . isosorbide mononitrate (IMDUR) 60 MG 24 hr tablet Take 1 tablet (60 mg total) by mouth daily.   Current Facility-Administered Medications (Other)  Medication Route  . Bevacizumab (AVASTIN) SOLN 1.25 mg Intravitreal  . Bevacizumab (AVASTIN) SOLN  1.25 mg Intravitreal  . Bevacizumab (AVASTIN) SOLN 1.25 mg Intravitreal  . Bevacizumab (AVASTIN) SOLN 1.25 mg Intravitreal      REVIEW OF SYSTEMS: ROS    Positive for: Neurological, Genitourinary, Endocrine, Cardiovascular, Eyes   Negative for: Constitutional, Gastrointestinal, Skin, Musculoskeletal, HENT, Respiratory, Psychiatric, Allergic/Imm, Heme/Lymph   Last edited by Leonie Douglas, COA on 04/22/2020  9:14 AM. (History)       ALLERGIES Allergies  Allergen Reactions  . Amlodipine Besy-Benazepril Hcl Anaphylaxis, Shortness Of Breath and Swelling    Mouth and tongue swelling  . Shellfish Allergy Anaphylaxis, Shortness Of Breath and Swelling    PAST MEDICAL HISTORY Past Medical History:  Diagnosis Date  . Asthma   . Back pain   . CHF (congestive heart failure) ([place])   . Chronic kidney disease   . Diabetic retinopathy ([place])    PDR OU  . DM (diabetes mellitus) ([place])   . Hepatitis C   . HTN (hypertension)   . Hypertensive retinopathy    OU  . RBBB (right bundle branch block)   . Stroke [facility]) 2018   Past Surgical History:  Procedure Laterality Date  . BACK SURGERY  1995  . CATARACT EXTRACTION Bilateral   . EYE SURGERY Bilateral    Cat Sx  . PARS PLANA VITRECTOMY Left 01/25/2017  . TOE AMPUTATION  2016    FAMILY HISTORY Family History  Problem Relation Age of Onset  . Coronary artery disease Mother        MI in her 23s  . Diabetes Mother   . Macular degeneration Mother   . Hypertension Other   . Diabetes Other   . Alzheimer's disease Other   . Coronary artery disease Brother   . Diabetes Brother   . Diabetes Sister     SOCIAL HISTORY Social History   Tobacco Use  . Smoking status: Former Smoker    Quit date: 01/07/2018    Years since quitting: 2.2  . Smokeless tobacco: Never Used  Substance Use Topics  . Alcohol use: No    Comment: Few beers every other day hx  . Drug use: No         OPHTHALMIC EXAM:  Base Eye Exam    Visual Acuity  (Snellen - Linear)      Right Left   Dist [place] 20/50 20/25   Dist ph [place] 20/40 NI       Tonometry (Tonopen, 9:22 AM)      Right Left   Pressure 19 17       Pupils      Dark Light Shape React APD   Right 2 1 Round Minimal None   Left 2 1 Round Minimal None       Visual Fields (Counting fingers)      Left Right    Full Full  Extraocular Movement      Right Left    Full Full       Neuro/Psych    Oriented x3: Yes   Mood/Affect: Normal       Dilation    Both eyes: 1.0% Mydriacyl, 2.5% Phenylephrine @ 9:22 AM        Slit Lamp and Fundus Exam    Slit Lamp Exam      Right Left   Lids/Lashes Dermatochalasis - upper lid, mild Meibomian gland dysfunction Dermatochalasis - upper lid, mild Meibomian gland dysfunction   Conjunctiva/Sclera nasal Pinguecula, Melanosis nasal/temporal Pinguecula, Melanosis   Cornea 1+ Punctate epithelial erosions, well healed temporal cataract wounds, Debris in tear film Mild Debris in tear film, 1+ Punctate epithelial erosions nasal and inferior, well healed cataract wounds   Anterior Chamber deep, narrow temporal angle, no cell or flare deep, narrow temporal angle; no cell/flare   Iris Round and poorly dilated to 5.9mm, No NVI Round and poorly dilated to 5.74mm, No NVI   Lens PC IOL in good position PC IOL in good position, mild PC folds   Vitreous Vitreous syneresis post vitrectomy       Fundus Exam      Right Left   Disc mild pallor, sharp rim mild pallor, sharp rim   C/D Ratio 0.4 0.5   Macula Blunted foveal reflex, scattered MA/DBH, +Epiretinal membrane, +edema/cystic changes temporal macula - persistent; scattered exudates - persistent / slightly improved; mild scattered fibrosis Flat, blunted foveal reflex, scattered Microaneurysms, scattered IRH - cluster just temporal to fovea, temporal macula very ischemic, persistent edema/cystic changes--slightly improved nasally   Vessels severe Vascular attenuation, sclerotic arterioles, +AV  crossing changes, Copper wiring, focal fibrosis superior to disc Vascular attenuation, mild Tortuousity   Periphery Attached, 360 PRP, good posterior laser fill in, scattered IRH Attached, good 360 PRP in place, scattered [facility]          IMAGING AND PROCEDURES  Imaging and Procedures for @TODAY @  OCT, Retina - OU - Both Eyes       Right Eye Quality was good. Central Foveal Thickness: 239. Progression has been stable. Findings include abnormal foveal contour, epiretinal membrane, no SRF, outer retinal atrophy, intraretinal fluid, macular pucker, intraretinal hyper-reflective material (Persistent cystic changes temporal macula, +ERM).   Left Eye Quality was good. Central Foveal Thickness: 256. Progression has improved. Findings include abnormal foveal contour, intraretinal fluid, no SRF, epiretinal membrane, outer retinal atrophy, inner retinal atrophy (Mild interval improvement in temporal IRF, but mild interval development of nasal IRF/edema).   Notes *Images captured and stored on drive  Diagnosis / Impression:  OU: ERM, diffuse atrophy, +DME OD: Persistent cystic changes centrally temporal macula, +ERM OS: Persistent IRF temporal macula, but mild interval improvement of nasal IRF/edema  Clinical management:  See below  Abbreviations: NFP - Normal foveal profile. CME - cystoid macular edema. PED - pigment epithelial detachment. IRF - intraretinal fluid. SRF - subretinal fluid. EZ - ellipsoid zone. ERM - epiretinal membrane. ORA - outer retinal atrophy. ORT - outer retinal tubulation. SRHM - subretinal hyper-reflective material        Intravitreal Injection, Pharmacologic Agent - OD - Right Eye       Time Out 04/22/2020. 10:34 AM. Confirmed correct patient, procedure, site, and patient consented.   Anesthesia Topical anesthesia was used. Anesthetic medications included Lidocaine 2%, Proparacaine 0.5%.   Procedure Preparation included 5% betadine to ocular surface, eyelid  speculum. A (32 g) needle was used.   Injection:  2  mg aflibercept Alfonse Flavors) SOLN   NDC: A3590391, Lot: [id number], Expiration date: 07/14/2020   Route: Intravitreal, Site: Right Eye, Waste: 0.05 mL  Post-op Post injection exam found visual acuity of at least counting fingers. The patient tolerated the procedure well. There were no complications. The patient received written and verbal post procedure care education. Post injection medications were not given.        Intravitreal Injection, Pharmacologic Agent - OS - Left Eye       Time Out 04/22/2020. 10:35 AM. Confirmed correct patient, procedure, site, and patient consented.   Anesthesia Topical anesthesia was used. Anesthetic medications included Lidocaine 2%, Proparacaine 0.5%.   Procedure Preparation included 5% betadine to ocular surface, eyelid speculum. A (32 g) needle was used.   Injection:  2 mg aflibercept Alfonse Flavors) SOLN   NDC: A3590391, Lot: [id number], Expiration date: 04/13/2021   Route: Intravitreal, Site: Left Eye, Waste: 0.05 mL  Post-op Post injection exam found visual acuity of at least counting fingers. The patient tolerated the procedure well. There were no complications. The patient received written and verbal post procedure care education. Post injection medications were not given.                 ASSESSMENT/PLAN:    ICD-10-CM   1. Proliferative diabetic retinopathy of both eyes with macular edema associated with type 2 diabetes mellitus (HCC)  A76.8115 Intravitreal Injection, Pharmacologic Agent - OD - Right Eye    Intravitreal Injection, Pharmacologic Agent - OS - Left Eye    aflibercept (EYLEA) SOLN 2 mg    aflibercept (EYLEA) SOLN 2 mg  2. Retinal edema  H35.81 OCT, Retina - OU - Both Eyes  3. Epiretinal membrane (ERM) of both eyes  H35.373   4. Essential hypertension  I10   5. Hypertensive retinopathy of both eyes  H35.033   6. Pseudophakia, both eyes  Z96.1   7. Dry eyes  H04.123      1,2. Proliferative diabetic retinopathy w/ DME, OU  - former pt of Dwana Melena at [facility] and Adonis Brook at [facility] -- known history of proliferative diabetic retinopathy  - history PPV OS and laser PRP OD with Dr. Manuella Ghazi for PDR OU w/ TRD OS -- 01/25/17  - history of intravitreal anti-VEGF therapy with Dr. Anderson Malta -- last IVE ~01/2018  - s/p PRP fill in OD (02.27.20) -- good fill in laser in place  - s/p IVA OU #1 (02.13.20), #2 (03.12.20), #3 (05.10.20), #4 (06.08.20), #5 (07.06.20), #6 (08.03.20), #7 (08.31.20), #8 (09.28.20), #9 (10.26.20)  - ?resistance to IVA  - s/p IVE OU #1 (11.23.20), #2 (01.22.21), #3 (03.05.21), #4 (04.02.21), #5 (04.30.21), #6 (06.07.21), #7 (07.19.21), #8 (08.25.21), #9 (10.06.21)  - exam shows stable regression of fine NVD OD, PRP laser in place OU  - FA (08.31.20) shows retinal NV vastly improved OD; significant vascular perfusion defects and increased FAZ OU; late leaking MA OU  - OCT shows OD:  Persistent cystic changes temporal macula, +ERM; OS:  persistent temporal IRF, but interval increase in nasal IRF/edema  - BCVA stable at 20/40 OD, OS improved from 20/30 to 20/25  - recommend IVE OU #10 today (11.10.21)  - pt wishes to proceed  - RBA of procedure discussed, questions answered  - informed consent obtained   - Eylea informed consent form signed and scanned on 01.22.2021  - see procedure note  - Eylea4U Benefits Investigation initiated 10.26.2020 -- approved as of 11.23.20 and verified for 2021  -  PACE has authorized 6 months of injections as of May 2021  - f/u 5 weeks, DFE/OCT/likely injection OU  3. Epiretinal membrane, both eyes   - mild ERM OU  - OCT w/ mild interval progression of ERM OD (BCVA OD 20/40 from 20/30)  - asymptomatic, no metamorphopsia  - no indication for surgery at this time  - monitor for now  4,5. Hypertensive retinopathy OU  - discussed importance of tight BP control  - monitor  6. Pseudophakia OU  - s/p  CE/IOL OU with expert surgeon, Dr. Kathlen Mody (OD: 2.25.21, OS: 03.24.21)  - beautiful surgeries, doing well  - post op drops per Dr. Kathlen Mody  - monitor  7. Dry eyes OU  - improving  - recommend artificial tears and lubricating ointment as needed  Ophthalmic Meds Ordered this visit:  Meds ordered this encounter  Medications  . aflibercept (EYLEA) SOLN 2 mg  . aflibercept (EYLEA) SOLN 2 mg      Return for 5 weeks DFE, OCT.  There are no Patient Instructions on file for this visit.   This document serves as a record of services personally performed by Gardiner Sleeper, MD, PhD. It was created on their behalf by Roselee Nova, COMT. The creation of this record is the provider's dictation and/or activities during the visit.  Electronically signed by: Roselee Nova, COMT 04/22/20 11:59 AM  Gardiner Sleeper, M.D., Ph.D. Diseases & Surgery of the Retina and Vitreous [facility]  I have reviewed the above documentation for accuracy and completeness, and I agree with the above. Gardiner Sleeper, M.D., Ph.D. 04/22/20 11:59 AM   Abbreviations: M myopia (nearsighted); A astigmatism; H hyperopia (farsighted); P presbyopia; Mrx spectacle prescription;  CTL contact lenses; OD right eye; OS left eye; OU both eyes  XT exotropia; ET esotropia; PEK punctate epithelial keratitis; PEE punctate epithelial erosions; DES dry eye syndrome; MGD meibomian gland dysfunction; ATs artificial tears; PFAT's preservative free artificial tears; [place] nuclear sclerotic cataract; PSC posterior subcapsular cataract; ERM epi-retinal membrane; PVD posterior vitreous detachment; RD retinal detachment; DM diabetes mellitus; DR diabetic retinopathy; NPDR non-proliferative diabetic retinopathy; PDR proliferative diabetic retinopathy; CSME clinically significant macular edema; DME diabetic macular edema; dbh dot blot hemorrhages; CWS cotton wool spot; POAG primary open angle glaucoma; C/D cup-to-disc ratio; HVF  humphrey visual field; GVF goldmann visual field; OCT optical coherence tomography; IOP intraocular pressure; BRVO Branch retinal vein occlusion; CRVO central retinal vein occlusion; CRAO central retinal artery occlusion; BRAO branch retinal artery occlusion; RT retinal tear; SB scleral buckle; PPV pars plana vitrectomy; VH Vitreous hemorrhage; PRP panretinal laser photocoagulation; IVK intravitreal kenalog; VMT vitreomacular traction; MH Macular hole;  NVD neovascularization of the disc; NVE neovascularization elsewhere; AREDS age related eye disease study; ARMD age related macular degeneration; POAG primary open angle glaucoma; EBMD epithelial/anterior basement membrane dystrophy; ACIOL anterior chamber intraocular lens; IOL intraocular lens; PCIOL posterior chamber intraocular lens; Phaco/IOL phacoemulsification with intraocular lens placement; [place] photorefractive keratectomy; LASIK laser assisted in situ keratomileusis; HTN hypertension; DM diabetes mellitus; COPD chronic obstructive pulmonary disease

## 2020-04-22 ENCOUNTER — Ambulatory Visit (INDEPENDENT_AMBULATORY_CARE_PROVIDER_SITE_OTHER): Payer: Medicare (Managed Care) | Admitting: Ophthalmology

## 2020-04-22 ENCOUNTER — Encounter (INDEPENDENT_AMBULATORY_CARE_PROVIDER_SITE_OTHER): Payer: Self-pay | Admitting: Ophthalmology

## 2020-04-22 ENCOUNTER — Other Ambulatory Visit: Payer: Self-pay

## 2020-04-22 DIAGNOSIS — I10 Essential (primary) hypertension: Secondary | ICD-10-CM | POA: Diagnosis not present

## 2020-04-22 DIAGNOSIS — H35373 Puckering of macula, bilateral: Secondary | ICD-10-CM | POA: Diagnosis not present

## 2020-04-22 DIAGNOSIS — H35033 Hypertensive retinopathy, bilateral: Secondary | ICD-10-CM

## 2020-04-22 DIAGNOSIS — E113513 Type 2 diabetes mellitus with proliferative diabetic retinopathy with macular edema, bilateral: Secondary | ICD-10-CM

## 2020-04-22 DIAGNOSIS — H04123 Dry eye syndrome of bilateral lacrimal glands: Secondary | ICD-10-CM

## 2020-04-22 DIAGNOSIS — H3581 Retinal edema: Secondary | ICD-10-CM | POA: Diagnosis not present

## 2020-04-22 DIAGNOSIS — Z961 Presence of intraocular lens: Secondary | ICD-10-CM

## 2020-04-22 MED ORDER — AFLIBERCEPT 2MG/0.05ML IZ SOLN FOR KALEIDOSCOPE
2.0000 mg | INTRAVITREAL | Status: AC | PRN
  Administered 2020-04-22: 2 mg via INTRAVITREAL

## 2020-05-28 ENCOUNTER — Encounter (INDEPENDENT_AMBULATORY_CARE_PROVIDER_SITE_OTHER): Payer: Medicare (Managed Care) | Admitting: Ophthalmology

## 2020-05-28 NOTE — Progress Notes (Signed)
[facility] Note  06/01/2020     CHIEF COMPLAINT Patient presents for Retina Follow Up   HISTORY OF PRESENT ILLNESS: John Jacobson is a 64 y.o. male who presents to the clinic today for:   HPI    Retina Follow Up    Patient presents with  Diabetic Retinopathy.  In both eyes.  This started 5 weeks ago.  I, the attending physician,  performed the HPI with the patient and updated documentation appropriately.          Comments    Patient here for 5 weeks retina follow up for PDR OU. Patient states vision doing OK. About the same. Ha slight soreness dry eyes. Not sore now.       Last edited by Bernarda Caffey, MD on 06/01/2020 10:38 PM. (History)     Patient states left eye vision is better today  Referring physician: No referring provider defined for this encounter.  HISTORICAL INFORMATION:   Selected notes from the MEDICAL RECORD NUMBER Referred by Dr. Dorian Pod [facility]) for DM exam LEE:  Ocular Hx- PMH-    CURRENT MEDICATIONS: Current Outpatient Medications (Ophthalmic Drugs)  Medication Sig  . carboxymethylcellulose 1 % ophthalmic solution Place 1 drop into both eyes 4 (four) times daily. (Patient not taking: Reported on 04/22/2020)   No current facility-administered medications for this visit. (Ophthalmic Drugs)   Current Outpatient Medications (Other)  Medication Sig  . acetaminophen (TYLENOL) 500 MG tablet Take 500 mg by mouth 2 (two) times daily as needed (for pain or headaches).  Marland Kitchen amoxicillin-clavulanate (AUGMENTIN) 875-125 MG tablet Take 1 tablet by mouth every 12 (twelve) hours. (Patient not taking: Reported on 04/22/2020)  . aspirin EC 325 MG EC tablet Take 1 tablet (325 mg total) by mouth daily.  Marland Kitchen atorvastatin (LIPITOR) 20 MG tablet Take 20 mg by mouth daily.  . carvedilol (COREG) 25 MG tablet Take 12.5 mg by mouth every 12 (twelve) hours.  . Cholecalciferol (VITAMIN D3) 2000 units TABS Take 4,000 Units by  mouth daily.  . cloNIDine (CATAPRES) 0.3 MG tablet Take 1 tablet (0.3 mg total) by mouth 3 (three) times daily.  . clopidogrel (PLAVIX) 75 MG tablet Take 75 mg by mouth daily.  . Cyanocobalamin (VITAMIN B-12 PO) Take 1 tablet by mouth daily.  Marland Kitchen doxazosin (CARDURA) 8 MG tablet Take 4 mg by mouth 2 (two) times daily.  . ferrous sulfate 325 (65 FE) MG tablet Take 325 mg by mouth every Monday, Wednesday, and Friday.  . furosemide (LASIX) 40 MG tablet Take 0.5 tablets (20 mg total) by mouth daily.  Marland Kitchen glucose 4 GM chewable tablet Chew 1 tablet by mouth daily as needed for low blood sugar.  . hydrALAZINE (APRESOLINE) 100 MG tablet Take 1 tablet (100 mg total) by mouth 3 (three) times daily.  Marland Kitchen HYDROcodone-acetaminophen (NORCO/VICODIN) 5-325 MG tablet Take 1 tablet by mouth every 6 (six) hours as needed.  . insulin glargine (LANTUS) 100 unit/mL SOPN Inject 0.3 mLs (30 Units total) into the skin at bedtime.  . isosorbide mononitrate (IMDUR) 60 MG 24 hr tablet Take 1 tablet (60 mg total) by mouth daily.  . montelukast (SINGULAIR) 10 MG tablet Take 10 mg by mouth at bedtime.  . polyethylene glycol (MIRALAX / GLYCOLAX) packet Take 17 g by mouth daily. (Patient taking differently: Take 17 g by mouth daily as needed for mild constipation. )  . potassium chloride (K-DUR) 10 MEQ tablet Take 1 tablet (  10 mEq total) by mouth daily.  Marland Kitchen senna (SENOKOT) 8.6 MG TABS tablet Take 1 tablet (8.6 mg total) by mouth daily. (Patient taking differently: Take 1 tablet by mouth daily as needed for mild constipation. )  . Skin Protectants, Misc. (EUCERIN) cream Apply 1 application topically See admin instructions. Apply to both feet daily after showering   Current Facility-Administered Medications (Other)  Medication Route  . Bevacizumab (AVASTIN) SOLN 1.25 mg Intravitreal  . Bevacizumab (AVASTIN) SOLN 1.25 mg Intravitreal  . Bevacizumab (AVASTIN) SOLN 1.25 mg Intravitreal  . Bevacizumab (AVASTIN) SOLN 1.25 mg Intravitreal       REVIEW OF SYSTEMS: ROS    Positive for: Neurological, Genitourinary, Endocrine, Cardiovascular, Eyes   Negative for: Constitutional, Gastrointestinal, Skin, Musculoskeletal, HENT, Respiratory, Psychiatric, Allergic/Imm, Heme/Lymph   Last edited by Theodore Demark, COA on 06/01/2020  2:46 PM. (History)       ALLERGIES Allergies  Allergen Reactions  . Amlodipine Besy-Benazepril Hcl Anaphylaxis, Shortness Of Breath and Swelling    Mouth and tongue swelling  . Shellfish Allergy Anaphylaxis, Shortness Of Breath and Swelling    PAST MEDICAL HISTORY Past Medical History:  Diagnosis Date  . Asthma   . Back pain   . CHF (congestive heart failure) ([place])   . Chronic kidney disease   . Diabetic retinopathy ([place])    PDR OU  . DM (diabetes mellitus) ([place])   . Hepatitis C   . HTN (hypertension)   . Hypertensive retinopathy    OU  . RBBB (right bundle branch block)   . Stroke [facility]) 2018   Past Surgical History:  Procedure Laterality Date  . BACK SURGERY  1995  . CATARACT EXTRACTION Bilateral   . EYE SURGERY Bilateral    Cat Sx  . PARS PLANA VITRECTOMY Left 01/25/2017  . TOE AMPUTATION  2016    FAMILY HISTORY Family History  Problem Relation Age of Onset  . Coronary artery disease Mother        MI in her 40s  . Diabetes Mother   . Macular degeneration Mother   . Hypertension Other   . Diabetes Other   . Alzheimer's disease Other   . Coronary artery disease Brother   . Diabetes Brother   . Diabetes Sister     SOCIAL HISTORY Social History   Tobacco Use  . Smoking status: Former Smoker    Quit date: 01/07/2018    Years since quitting: 2.4  . Smokeless tobacco: Never Used  Substance Use Topics  . Alcohol use: No    Comment: Few beers every other day hx  . Drug use: No         OPHTHALMIC EXAM:  Base Eye Exam    Visual Acuity (Snellen - Linear)      Right Left   Dist [place] 20/50 -1 20/20 -1   Dist ph  20/40 -2        Tonometry (Tonopen, 2:43  PM)      Right Left   Pressure 18 17       Pupils      Dark Light Shape React APD   Right 2 1 Round Minimal None   Left 2 1 Round Minimal None       Visual Fields (Counting fingers)      Left Right    Full Full       Extraocular Movement      Right Left    Full Full       Neuro/Psych  Oriented x3: Yes   Mood/Affect: Normal       Dilation    Both eyes: 1.0% Mydriacyl, 2.5% Phenylephrine @ 2:43 PM        Slit Lamp and Fundus Exam    Slit Lamp Exam      Right Left   Lids/Lashes Dermatochalasis - upper lid, mild Meibomian gland dysfunction Dermatochalasis - upper lid, mild Meibomian gland dysfunction   Conjunctiva/Sclera nasal Pinguecula, Melanosis nasal/temporal Pinguecula, Melanosis   Cornea 1+ Punctate epithelial erosions, well healed temporal cataract wounds, Debris in tear film Mild Debris in tear film, 1+ Punctate epithelial erosions nasal and inferior, well healed cataract wounds   Anterior Chamber deep, narrow temporal angle, no cell or flare deep, narrow temporal angle; no cell/flare   Iris Round and poorly dilated to 5.8m, No NVI Round and poorly dilated to 5.214m No NVI   Lens PC IOL in good position PC IOL in good position, mild PC folds   Vitreous Vitreous syneresis post vitrectomy       Fundus Exam      Right Left   Disc mild pallor, sharp rim mild pallor, sharp rim   C/D Ratio 0.3 0.5   Macula Blunted foveal reflex, scattered MA/DBH, +Epiretinal membrane, +edema/cystic changes temporal macula - improved; scattered exudates - persistent / slightly improved; mild scattered fibrosis Flat, blunted foveal reflex, scattered Microaneurysms, scattered IRH - cluster just temporal to fovea, temporal macula very ischemic, persistent edema/cystic changes, temporal mac -- slighlty increased   Vessels severe Vascular attenuation, sclerotic arterioles, +AV crossing changes, Copper wiring, focal fibrosis superior to disc Vascular attenuation, mild Tortuousity    Periphery Attached, 360 PRP, good posterior laser fill in, scattered IRH Attached, good 360 PRP in place, [facility]        IMAGING AND PROCEDURES  Imaging and Procedures for _0 @  OCT, Retina - OU - Both Eyes       Right Eye Quality was good. Central Foveal Thickness: 242. Progression has improved. Findings include abnormal foveal contour, epiretinal membrane, no SRF, outer retinal atrophy, intraretinal fluid, macular pucker, intraretinal hyper-reflective material (Mild interval improvement in IRF, +ERM).   Left Eye Quality was good. Central Foveal Thickness: 256. Progression has worsened. Findings include abnormal foveal contour, intraretinal fluid, no SRF, epiretinal membrane, outer retinal atrophy, inner retinal atrophy (interval increase in temporal IRF).   Notes *Images captured and stored on drive  Diagnosis / Impression:  OU: ERM, diffuse atrophy, +DME OD: Mild interval improvement in IRF, +ERM OS: interval increase in temporal IRF  Clinical management:  See below  Abbreviations: NFP - Normal foveal profile. CME - cystoid macular edema. PED - pigment epithelial detachment. IRF - intraretinal fluid. SRF - subretinal fluid. EZ - ellipsoid zone. ERM - epiretinal membrane. ORA - outer retinal atrophy. ORT - outer retinal tubulation. SRHM - subretinal hyper-reflective material        Intravitreal Injection, Pharmacologic Agent - OD - Right Eye       Time Out 06/01/2020. 3:22 PM. Confirmed correct patient, procedure, site, and patient consented.   Anesthesia Topical anesthesia was used. Anesthetic medications included Lidocaine 2%, Proparacaine 0.5%.   Procedure Preparation included 5% betadine to ocular surface, eyelid speculum. A (32 g) needle was used.   Injection:  2 mg aflibercept (EAlfonse FlavorsSOLN   NDC: [id number]: [id number] date: 09/10/2020   Route: Intravitreal, Site: Right Eye, Waste: 0.05 mL  Post-op Post injection exam found  visual acuity of at least counting fingers. The  patient tolerated the procedure well. There were no complications. The patient received written and verbal post procedure care education. Post injection medications were not given.        Intravitreal Injection, Pharmacologic Agent - OS - Left Eye       Time Out 06/01/2020. 3:22 PM. Confirmed correct patient, procedure, site, and patient consented.   Anesthesia Topical anesthesia was used. Anesthetic medications included Lidocaine 2%, Proparacaine 0.5%.   Procedure Preparation included 5% betadine to ocular surface, eyelid speculum. A (32 g) needle was used.   Injection:  2 mg aflibercept Alfonse Flavors) SOLN   NDC: [id number], Lot: [id number], Expiration date: 04/12/2021   Route: Intravitreal, Site: Left Eye, Waste: 0.05 mL  Post-op Post injection exam found visual acuity of at least counting fingers. The patient tolerated the procedure well. There were no complications. The patient received written and verbal post procedure care education. Post injection medications were not given.                 ASSESSMENT/PLAN:    ICD-10-CM   1. Proliferative diabetic retinopathy of both eyes with macular edema associated with type 2 diabetes mellitus (HCC)  B51.0258 Intravitreal Injection, Pharmacologic Agent - OD - Right Eye    Intravitreal Injection, Pharmacologic Agent - OS - Left Eye    aflibercept (EYLEA) SOLN 2 mg    aflibercept (EYLEA) SOLN 2 mg  2. Retinal edema  H35.81 OCT, Retina - OU - Both Eyes  3. Epiretinal membrane (ERM) of both eyes  H35.373   4. Essential hypertension  I10   5. Hypertensive retinopathy of both eyes  H35.033   6. Pseudophakia, both eyes  Z96.1   7. Dry eyes  H04.123     1,2. Proliferative diabetic retinopathy w/ DME, OU  - former pt of Dwana Melena at [facility] and Adonis Brook at [place] -- known history of proliferative diabetic retinopathy  - history PPV OS and laser PRP OD with Dr. Manuella Ghazi for  PDR OU w/ TRD OS -- 01/25/17  - history of intravitreal anti-VEGF therapy with Dr. Anderson Malta -- last IVE ~01/2018  - s/p PRP fill in OD (02.27.20) -- good fill in laser in place  - s/p IVA OU #1 (02.13.20), #2 (03.12.20), #3 (05.10.20), #4 (06.08.20), #5 (07.06.20), #6 (08.03.20), #7 (08.31.20), #8 (09.28.20), #9 (10.26.20)  - ?resistance to IVA  - s/p IVE OU #1 (11.23.20), #2 (01.22.21), #3 (03.05.21), #4 (04.02.21), #5 (04.30.21), #6 (06.07.21), #7 (07.19.21), #8 (08.25.21), #9 (10.06.21), #10 (11.10.21)  - exam shows stable regression of fine NVD OD, PRP laser in place OU  - FA (08.31.20) shows retinal NV vastly improved OD; significant vascular perfusion defects and increased FAZ OU; late leaking MA OU  - OCT shows OD:  Interval improvement in cystic changes temporal macula, +ERM; OS:  Interval increase in temporal IRF  - BCVA stable at 20/40 OD, OS improved to 20/20 from 20/25  - recommend IVE OU #11 today (12.20.21)  - pt wishes to proceed  - RBA of procedure discussed, questions answered  - informed consent obtained   - Eylea informed consent form signed and scanned on 01.22.2021  - see procedure note  - Eylea4U Benefits Investigation initiated 10.26.2020 -- approved as of 11.23.20 and verified for 2021  - [facility] has re authorized IVE injections  - f/u 5 weeks, DFE/OCT/likely injection OU  3. Epiretinal membrane, both eyes   - mild ERM OU  - OCT w/ mild interval progression of ERM OD (BCVA  OD 20/40 from 20/30)  - asymptomatic, no metamorphopsia  - no indication for surgery at this time  - monitor for now  4,5. Hypertensive retinopathy OU  - discussed importance of tight BP control  - monitor  6. Pseudophakia OU  - s/p CE/IOL OU with expert surgeon, Dr. Kathlen Mody (OD: 2.25.21, OS: 03.24.21)  - beautiful surgeries, doing well  - post op drops per Dr. Kathlen Mody  - monitor  7. Dry eyes OU  - improving  - recommend artificial tears and lubricating ointment as needed  Ophthalmic Meds  Ordered this visit:  Meds ordered this encounter  Medications  . aflibercept (EYLEA) SOLN 2 mg  . aflibercept (EYLEA) SOLN 2 mg      Return in about 5 weeks (around 07/06/2020) for f/u PDR OU, DFE, OCT.  There are no Patient Instructions on file for this visit.  This document serves as a record of services personally performed by Gardiner Sleeper, MD, PhD. It was created on their behalf by Leeann Must, [place], an ophthalmic technician. The creation of this record is the provider's dictation and/or activities during the visit.    Electronically signed by: Leeann Must, [place] 12.16.2021 10:43 PM   This document serves as a record of services personally performed by Gardiner Sleeper, MD, PhD. It was created on their behalf by San Jetty. Owens Shark, OA an ophthalmic technician. The creation of this record is the provider's dictation and/or activities during the visit.    Electronically signed by: San Jetty. Marguerita Merles 12.20.2021 10:43 PM  Gardiner Sleeper, M.D., Ph.D. Diseases & Surgery of the Retina and [place] 06/01/2020   I have reviewed the above documentation for accuracy and completeness, and I agree with the above. Gardiner Sleeper, M.D., Ph.D. 06/01/20 10:43 PM   Abbreviations: M myopia (nearsighted); A astigmatism; H hyperopia (farsighted); P presbyopia; Mrx spectacle prescription;  CTL contact lenses; OD right eye; OS left eye; OU both eyes  XT exotropia; ET esotropia; PEK punctate epithelial keratitis; PEE punctate epithelial erosions; DES dry eye syndrome; MGD meibomian gland dysfunction; ATs artificial tears; PFAT's preservative free artificial tears; [place] nuclear sclerotic cataract; PSC posterior subcapsular cataract; ERM epi-retinal membrane; PVD posterior vitreous detachment; RD retinal detachment; DM diabetes mellitus; DR diabetic retinopathy; NPDR non-proliferative diabetic retinopathy; PDR proliferative diabetic retinopathy; CSME clinically  significant macular edema; DME diabetic macular edema; dbh dot blot hemorrhages; CWS cotton wool spot; POAG primary open angle glaucoma; C/D cup-to-disc ratio; HVF humphrey visual field; GVF goldmann visual field; OCT optical coherence tomography; IOP intraocular pressure; BRVO Branch retinal vein occlusion; CRVO central retinal vein occlusion; CRAO central retinal artery occlusion; BRAO branch retinal artery occlusion; RT retinal tear; SB scleral buckle; PPV pars plana vitrectomy; VH Vitreous hemorrhage; PRP panretinal laser photocoagulation; IVK intravitreal kenalog; VMT vitreomacular traction; MH Macular hole;  NVD neovascularization of the disc; NVE neovascularization elsewhere; AREDS age related eye disease study; ARMD age related macular degeneration; POAG primary open angle glaucoma; EBMD epithelial/anterior basement membrane dystrophy; ACIOL anterior chamber intraocular lens; IOL intraocular lens; PCIOL posterior chamber intraocular lens; Phaco/IOL phacoemulsification with intraocular lens placement; [place] photorefractive keratectomy; LASIK laser assisted in situ keratomileusis; HTN hypertension; DM diabetes mellitus; COPD chronic obstructive pulmonary disease

## 2020-06-01 ENCOUNTER — Encounter (INDEPENDENT_AMBULATORY_CARE_PROVIDER_SITE_OTHER): Payer: Self-pay | Admitting: Ophthalmology

## 2020-06-01 ENCOUNTER — Ambulatory Visit (INDEPENDENT_AMBULATORY_CARE_PROVIDER_SITE_OTHER): Payer: Medicare (Managed Care) | Admitting: Ophthalmology

## 2020-06-01 ENCOUNTER — Other Ambulatory Visit: Payer: Self-pay

## 2020-06-01 DIAGNOSIS — H04123 Dry eye syndrome of bilateral lacrimal glands: Secondary | ICD-10-CM

## 2020-06-01 DIAGNOSIS — H3581 Retinal edema: Secondary | ICD-10-CM

## 2020-06-01 DIAGNOSIS — I10 Essential (primary) hypertension: Secondary | ICD-10-CM | POA: Diagnosis not present

## 2020-06-01 DIAGNOSIS — H35373 Puckering of macula, bilateral: Secondary | ICD-10-CM | POA: Diagnosis not present

## 2020-06-01 DIAGNOSIS — E113513 Type 2 diabetes mellitus with proliferative diabetic retinopathy with macular edema, bilateral: Secondary | ICD-10-CM

## 2020-06-01 DIAGNOSIS — Z961 Presence of intraocular lens: Secondary | ICD-10-CM

## 2020-06-01 DIAGNOSIS — H35033 Hypertensive retinopathy, bilateral: Secondary | ICD-10-CM

## 2020-06-01 MED ORDER — AFLIBERCEPT 2MG/0.05ML IZ SOLN FOR KALEIDOSCOPE
2.0000 mg | INTRAVITREAL | Status: AC | PRN
  Administered 2020-06-01: 23:00:00 2 mg via INTRAVITREAL

## 2020-07-06 ENCOUNTER — Encounter (INDEPENDENT_AMBULATORY_CARE_PROVIDER_SITE_OTHER): Payer: Medicare (Managed Care) | Admitting: Ophthalmology

## 2020-07-21 NOTE — Progress Notes (Signed)
[facility] Note  07/27/2020     CHIEF COMPLAINT Patient presents for Retina Follow Up   HISTORY OF PRESENT ILLNESS: John A Ediel Unangst. is a 65 y.o. male who presents to the clinic today for:   HPI    Retina Follow Up    Patient presents with  Diabetic Retinopathy.  In both eyes.  This started weeks ago.  Severity is moderate.  Duration of weeks.  Since onset it is stable.  I, the attending physician,  performed the HPI with the patient and updated documentation appropriately.          Comments    Pt states vision is the same OU.  Pt denies eye pain or discomfort and denies any new or worsening floaters or fol OU.       Last edited by Bernarda Caffey, MD on 07/27/2020  2:46 PM. (History)     Patient is delayed to follow up from 5 weeks to 8 weeks, pt states vision is stable  Referring physician: No referring provider defined for this encounter.  HISTORICAL INFORMATION:   Selected notes from the MEDICAL RECORD NUMBER Referred by Dr. Dorian Pod [facility]) for DM exam LEE:  Ocular Hx- PMH-    CURRENT MEDICATIONS: Current Outpatient Medications (Ophthalmic Drugs)  Medication Sig  . carboxymethylcellulose 1 % ophthalmic solution Place 1 drop into both eyes 4 (four) times daily. (Patient not taking: Reported on 04/22/2020)   No current facility-administered medications for this visit. (Ophthalmic Drugs)   Current Outpatient Medications (Other)  Medication Sig  . acetaminophen (TYLENOL) 500 MG tablet Take 500 mg by mouth 2 (two) times daily as needed (for pain or headaches).  Marland Kitchen amoxicillin-clavulanate (AUGMENTIN) 875-125 MG tablet Take 1 tablet by mouth every 12 (twelve) hours. (Patient not taking: Reported on 04/22/2020)  . aspirin EC 325 MG EC tablet Take 1 tablet (325 mg total) by mouth daily.  Marland Kitchen atorvastatin (LIPITOR) 20 MG tablet Take 20 mg by mouth daily.  . carvedilol (COREG) 25 MG tablet Take 12.5 mg by mouth every 12  (twelve) hours.  . Cholecalciferol (VITAMIN D3) 2000 units TABS Take 4,000 Units by mouth daily.  . cloNIDine (CATAPRES) 0.3 MG tablet Take 1 tablet (0.3 mg total) by mouth 3 (three) times daily.  . clopidogrel (PLAVIX) 75 MG tablet Take 75 mg by mouth daily.  . Cyanocobalamin (VITAMIN B-12 PO) Take 1 tablet by mouth daily.  Marland Kitchen doxazosin (CARDURA) 8 MG tablet Take 4 mg by mouth 2 (two) times daily.  . ferrous sulfate 325 (65 FE) MG tablet Take 325 mg by mouth every Monday, Wednesday, and Friday.  . furosemide (LASIX) 40 MG tablet Take 0.5 tablets (20 mg total) by mouth daily.  Marland Kitchen glucose 4 GM chewable tablet Chew 1 tablet by mouth daily as needed for low blood sugar.  . hydrALAZINE (APRESOLINE) 100 MG tablet Take 1 tablet (100 mg total) by mouth 3 (three) times daily.  Marland Kitchen HYDROcodone-acetaminophen (NORCO/VICODIN) 5-325 MG tablet Take 1 tablet by mouth every 6 (six) hours as needed.  . insulin glargine (LANTUS) 100 unit/mL SOPN Inject 0.3 mLs (30 Units total) into the skin at bedtime.  . isosorbide mononitrate (IMDUR) 60 MG 24 hr tablet Take 1 tablet (60 mg total) by mouth daily.  . montelukast (SINGULAIR) 10 MG tablet Take 10 mg by mouth at bedtime.  . polyethylene glycol (MIRALAX / GLYCOLAX) packet Take 17 g by mouth daily. (Patient taking differently: Take 17  g by mouth daily as needed for mild constipation. )  . potassium chloride (K-DUR) 10 MEQ tablet Take 1 tablet (10 mEq total) by mouth daily.  Marland Kitchen senna (SENOKOT) 8.6 MG TABS tablet Take 1 tablet (8.6 mg total) by mouth daily. (Patient taking differently: Take 1 tablet by mouth daily as needed for mild constipation. )  . Skin Protectants, Misc. (EUCERIN) cream Apply 1 application topically See admin instructions. Apply to both feet daily after showering   Current Facility-Administered Medications (Other)  Medication Route  . Bevacizumab (AVASTIN) SOLN 1.25 mg Intravitreal  . Bevacizumab (AVASTIN) SOLN 1.25 mg Intravitreal  . Bevacizumab  (AVASTIN) SOLN 1.25 mg Intravitreal  . Bevacizumab (AVASTIN) SOLN 1.25 mg Intravitreal      REVIEW OF SYSTEMS: ROS    Positive for: Neurological, Genitourinary, Endocrine, Cardiovascular, Eyes   Negative for: Constitutional, Gastrointestinal, Skin, Musculoskeletal, HENT, Respiratory, Psychiatric, Allergic/Imm, Heme/Lymph   Last edited by Doneen Poisson on 07/27/2020  1:58 PM. (History)       ALLERGIES Allergies  Allergen Reactions  . Amlodipine Besy-Benazepril Hcl Anaphylaxis, Shortness Of Breath and Swelling    Mouth and tongue swelling  . Shellfish Allergy Anaphylaxis, Shortness Of Breath and Swelling    PAST MEDICAL HISTORY Past Medical History:  Diagnosis Date  . Asthma   . Back pain   . CHF (congestive heart failure) ([place])   . Chronic kidney disease   . Diabetic retinopathy ([place])    PDR OU  . DM (diabetes mellitus) ([place])   . Hepatitis C   . HTN (hypertension)   . Hypertensive retinopathy    OU  . RBBB (right bundle branch block)   . Stroke [facility]) 2018   Past Surgical History:  Procedure Laterality Date  . BACK SURGERY  1995  . CATARACT EXTRACTION Bilateral   . EYE SURGERY Bilateral    Cat Sx  . PARS PLANA VITRECTOMY Left 01/25/2017  . TOE AMPUTATION  2016    FAMILY HISTORY Family History  Problem Relation Age of Onset  . Coronary artery disease Mother        MI in her 39s  . Diabetes Mother   . Macular degeneration Mother   . Hypertension Other   . Diabetes Other   . Alzheimer's disease Other   . Coronary artery disease Brother   . Diabetes Brother   . Diabetes Sister     SOCIAL HISTORY Social History   Tobacco Use  . Smoking status: Former Smoker    Quit date: 01/07/2018    Years since quitting: 2.5  . Smokeless tobacco: Never Used  Substance Use Topics  . Alcohol use: No    Comment: Few beers every other day hx  . Drug use: No         OPHTHALMIC EXAM:  Base Eye Exam    Visual Acuity (Snellen - Linear)      Right Left   Dist  [place] 20/50 -2 20/20 -1   Dist ph [place] 20/40 -2        Tonometry (Tonopen, 2:03 PM)      Right Left   Pressure 14 14       Pupils      Dark Light Shape React APD   Right 3 2 Round Minimal 0   Left 3 2 Round Minimal 0       Visual Fields      Left Right    Full Full       Extraocular Movement  Right Left    Full Full       Neuro/Psych    Oriented x3: Yes   Mood/Affect: Normal       Dilation    Both eyes: 1.0% Mydriacyl, 2.5% Phenylephrine @ 2:03 PM        Slit Lamp and Fundus Exam    Slit Lamp Exam      Right Left   Lids/Lashes Dermatochalasis - upper lid, mild Meibomian gland dysfunction Dermatochalasis - upper lid, mild Meibomian gland dysfunction   Conjunctiva/Sclera nasal Pinguecula, Melanosis nasal/temporal Pinguecula, Melanosis   Cornea 1+ Punctate epithelial erosions, well healed temporal cataract wounds, Debris in tear film Mild Debris in tear film, 1+ Punctate epithelial erosions nasal and inferior, well healed cataract wounds   Anterior Chamber deep, narrow temporal angle, no cell or flare deep, narrow temporal angle; no cell/flare   Iris Round and poorly dilated to 5.32m, No NVI Round and poorly dilated to 5.275m No NVI   Lens PC IOL in good position PC IOL in good position, mild PC folds   Vitreous Vitreous syneresis post vitrectomy       Fundus Exam      Right Left   Disc mild pallor, sharp rim mild pallor, sharp rim   C/D Ratio 0.3 0.5   Macula Blunted foveal reflex, scattered MA/DBH, +Epiretinal membrane, persistent edema/cystic changes temporal macula; scattered exudates - persistent / slightly improved; mild scattered fibrosis Flat, blunted foveal reflex, scattered Microaneurysms, scattered IRH - cluster just temporal to fovea, temporal macula very ischemic, persistent edema/cystic changes, temporal mac -- slighlty improved   Vessels severe Vascular attenuation, sclerotic arterioles, +AV crossing changes, Copper wiring, focal fibrosis superior to  disc Vascular attenuation, mild Tortuousity   Periphery Attached, 360 PRP, good posterior laser fill in, scattered IRH Attached, good 360 PRP in place, [facility]        IMAGING AND PROCEDURES  Imaging and Procedures for _0 @  OCT, Retina - OU - Both Eyes       Right Eye Quality was good. Central Foveal Thickness: 239. Progression has been stable. Findings include abnormal foveal contour, epiretinal membrane, no SRF, outer retinal atrophy, intraretinal fluid, macular pucker, intraretinal hyper-reflective material (Persistent cystic changes -- ?slighlty improved, +ERM).   Left Eye Quality was good. Central Foveal Thickness: 253. Progression has improved. Findings include abnormal foveal contour, intraretinal fluid, no SRF, epiretinal membrane, outer retinal atrophy, inner retinal atrophy (interval improvement in temporal IRF).   Notes *Images captured and stored on drive  Diagnosis / Impression:  OU: ERM, diffuse atrophy, +DME OD: Persistent cystic changes -- ?slighlty improved, +ERM OS: interval improvement in temporal IRF  Clinical management:  See below  Abbreviations: NFP - Normal foveal profile. CME - cystoid macular edema. PED - pigment epithelial detachment. IRF - intraretinal fluid. SRF - subretinal fluid. EZ - ellipsoid zone. ERM - epiretinal membrane. ORA - outer retinal atrophy. ORT - outer retinal tubulation. SRHM - subretinal hyper-reflective material        Intravitreal Injection, Pharmacologic Agent - OD - Right Eye       Time Out 07/27/2020. 2:29 PM. Confirmed correct patient, procedure, site, and patient consented.   Anesthesia Topical anesthesia was used. Anesthetic medications included Lidocaine 2%, Proparacaine 0.5%.   Procedure Preparation included 5% betadine to ocular surface, eyelid speculum. A (32 g) needle was used.   Injection:  2 mg aflibercept (EAlfonse FlavorsSOLN   NDC: 6173220-254-27Lot: [id number] date: 11/09/2020   Route:  Intravitreal, Site:  Right Eye, Waste: 0.05 mL  Post-op Post injection exam found visual acuity of at least counting fingers. The patient tolerated the procedure well. There were no complications. The patient received written and verbal post procedure care education. Post injection medications were not given.        Intravitreal Injection, Pharmacologic Agent - OS - Left Eye       Time Out 07/27/2020. 2:29 PM. Confirmed correct patient, procedure, site, and patient consented.   Anesthesia Topical anesthesia was used. Anesthetic medications included Lidocaine 2%, Proparacaine 0.5%.   Procedure Preparation included 5% betadine to ocular surface, eyelid speculum. A (32 g) needle was used.   Injection:  2 mg aflibercept Alfonse Flavors) SOLN   NDC: M7179715, Lot: [id number], Expiration date: 04/11/2021   Route: Intravitreal, Site: Left Eye, Waste: 0.05 mL  Post-op Post injection exam found visual acuity of at least counting fingers. The patient tolerated the procedure well. There were no complications. The patient received written and verbal post procedure care education. Post injection medications were not given.                 ASSESSMENT/PLAN:    ICD-10-CM   1. Proliferative diabetic retinopathy of both eyes with macular edema associated with type 2 diabetes mellitus (HCC)  W40.9735 Intravitreal Injection, Pharmacologic Agent - OD - Right Eye    Intravitreal Injection, Pharmacologic Agent - OS - Left Eye    aflibercept (EYLEA) SOLN 2 mg    aflibercept (EYLEA) SOLN 2 mg  2. Retinal edema  H35.81 OCT, Retina - OU - Both Eyes  3. Epiretinal membrane (ERM) of both eyes  H35.373   4. Essential hypertension  I10   5. Hypertensive retinopathy of both eyes  H35.033   6. Pseudophakia, both eyes  Z96.1   7. Dry eyes  H04.123   8. Pseudophakia  Z96.1   9. Combined forms of age-related cataract of left eye  H25.812     1,2. Proliferative diabetic retinopathy w/ DME, OU  - former pt  of Dwana Melena at [facility] and Adon[facility] at [facility] -- known history of proliferative diabetic retinopathy  - history PPV OS and laser PRP OD with Dr. Manuella Ghazi for PDR OU w/ TRD OS -- 01/25/17  - history of intravitreal anti-VEGF therapy with Dr. Anderson Malta -- last IVE ~01/2018  - s/p PRP fill in OD (02.27.20) -- good fill in laser in place  - s/p IVA OU #1 (02.13.20), #2 (03.12.20), #3 (05.10.20), #4 (06.08.20), #5 (07.06.20), #6 (08.03.20), #7 (08.31.20), #8 (09.28.20), #9 (10.26.20)  - ?resistance to IVA  - s/p IVE OU #1 (11.23.20), #2 (01.22.21), #3 (03.05.21), #4 (04.02.21), #5 (04.30.21), #6 (06.07.21), #7 (07.19.21), #8 (08.25.21), #9 (10.06.21), #10 (11.10.21), #11 (12.20.21)  - exam shows stable regression of fine NVD OD, PRP laser in place OU  - FA (08.31.20) shows retinal NV vastly improved OD; significant vascular perfusion defects and increased FAZ OU; late leaking MA OU  **today, f/u delayed to 8 wks from 5 due to weather**  - OCT shows OD:  Persistent cystic changes temporal macula -- ?slightly improved, +ERM; OS:  Interval improvement in temporal IRF at 8 wks  - BCVA stable at 20/40 OD, OS stable at 20/20 at 8 wks  - recommend IVE OU #12 today 02.14.22 w/ f/u at 8 wks  - pt wishes to proceed  - RBA of procedure discussed, questions answered  - informed consent obtained   - Eylea informed consent form signed and scanned  on 01.22.2021  - see procedure note  - Eylea4U Benefits Investigation initiated 10.26.2020 -- approved as of 11.30.20 and verified for 2022  - [facility] has re authorized IVE injections  - f/u 8 weeks, DFE/OCT/likely injection OU  3. Epiretinal membrane, both eyes   - mild ERM OU  - asymptomatic, no metamorphopsia  - no indication for surgery at this time  - monitor for now  4,5. Hypertensive retinopathy OU  - discussed importance of tight BP control  - monitor  6. Pseudophakia OU  - s/p CE/IOL OU with expert surgeon, Dr. Kathlen Mody (OD: 2.25.21, OS:  03.24.21)  - beautiful surgeries, doing well  - post op drops per Dr. Kathlen Mody  - monitor  7. Dry eyes OU  - improving  - recommend artificial tears and lubricating ointment as needed  Ophthalmic Meds Ordered this visit:  Meds ordered this encounter  Medications  . aflibercept (EYLEA) SOLN 2 mg  . aflibercept (EYLEA) SOLN 2 mg      Return in about 8 weeks (around 09/21/2020) for f/u PDR OU, DFE, OCT.  There are no Patient Instructions on file for this visit.  This document serves as a record of services personally performed by Gardiner Sleeper, MD, PhD. It was created on their behalf by Leeann Must, [place], an ophthalmic technician. The creation of this record is the provider's dictation and/or activities during the visit.    Electronically signed by: Leeann Must, COA _0 @ 2:56 PM   This document serves as a record of services personally performed by Gardiner Sleeper, MD, PhD. It was created on their behalf by San Jetty. Owens Shark, OA an ophthalmic technician. The creation of this record is the provider's dictation and/or activities during the visit.    Electronically signed by: San Jetty. Owens Shark, [place] 02.14.2022 2:56 PM  Gardiner Sleeper, M.D., Ph.D. Diseases & Surgery of the Retina and [place] 07/27/2020   I have reviewed the above documentation for accuracy and completeness, and I agree with the above. Gardiner Sleeper, M.D., Ph.D. 07/27/20 2:56 PM  Abbreviations: M myopia (nearsighted); A astigmatism; H hyperopia (farsighted); P presbyopia; Mrx spectacle prescription;  CTL contact lenses; OD right eye; OS left eye; OU both eyes  XT exotropia; ET esotropia; PEK punctate epithelial keratitis; PEE punctate epithelial erosions; DES dry eye syndrome; MGD meibomian gland dysfunction; ATs artificial tears; PFAT's preservative free artificial tears; [place] nuclear sclerotic cataract; PSC posterior subcapsular cataract; ERM epi-retinal membrane; PVD posterior  vitreous detachment; RD retinal detachment; DM diabetes mellitus; DR diabetic retinopathy; NPDR non-proliferative diabetic retinopathy; PDR proliferative diabetic retinopathy; CSME clinically significant macular edema; DME diabetic macular edema; dbh dot blot hemorrhages; CWS cotton wool spot; POAG primary open angle glaucoma; C/D cup-to-disc ratio; HVF humphrey visual field; GVF goldmann visual field; OCT optical coherence tomography; IOP intraocular pressure; BRVO Branch retinal vein occlusion; CRVO central retinal vein occlusion; CRAO central retinal artery occlusion; BRAO branch retinal artery occlusion; RT retinal tear; SB scleral buckle; PPV pars plana vitrectomy; VH Vitreous hemorrhage; PRP panretinal laser photocoagulation; IVK intravitreal kenalog; VMT vitreomacular traction; MH Macular hole;  NVD neovascularization of the disc; NVE neovascularization elsewhere; AREDS age related eye disease study; ARMD age related macular degeneration; POAG primary open angle glaucoma; EBMD epithelial/anterior basement membrane dystrophy; ACIOL anterior chamber intraocular lens; IOL intraocular lens; PCIOL posterior chamber intraocular lens; Phaco/IOL phacoemulsification with intraocular lens placement; [place] photorefractive keratectomy; LASIK laser assisted in situ keratomileusis; HTN hypertension; DM diabetes mellitus; COPD chronic obstructive  pulmonary disease

## 2020-07-27 ENCOUNTER — Ambulatory Visit (INDEPENDENT_AMBULATORY_CARE_PROVIDER_SITE_OTHER): Payer: Medicare (Managed Care) | Admitting: Ophthalmology

## 2020-07-27 ENCOUNTER — Encounter (INDEPENDENT_AMBULATORY_CARE_PROVIDER_SITE_OTHER): Payer: Self-pay | Admitting: Ophthalmology

## 2020-07-27 ENCOUNTER — Other Ambulatory Visit: Payer: Self-pay

## 2020-07-27 DIAGNOSIS — Z961 Presence of intraocular lens: Secondary | ICD-10-CM

## 2020-07-27 DIAGNOSIS — I10 Essential (primary) hypertension: Secondary | ICD-10-CM | POA: Diagnosis not present

## 2020-07-27 DIAGNOSIS — H35033 Hypertensive retinopathy, bilateral: Secondary | ICD-10-CM

## 2020-07-27 DIAGNOSIS — H04123 Dry eye syndrome of bilateral lacrimal glands: Secondary | ICD-10-CM

## 2020-07-27 DIAGNOSIS — E113513 Type 2 diabetes mellitus with proliferative diabetic retinopathy with macular edema, bilateral: Secondary | ICD-10-CM

## 2020-07-27 DIAGNOSIS — H35373 Puckering of macula, bilateral: Secondary | ICD-10-CM | POA: Diagnosis not present

## 2020-07-27 DIAGNOSIS — H3581 Retinal edema: Secondary | ICD-10-CM | POA: Diagnosis not present

## 2020-07-27 DIAGNOSIS — H25812 Combined forms of age-related cataract, left eye: Secondary | ICD-10-CM

## 2020-07-27 MED ORDER — AFLIBERCEPT 2MG/0.05ML IZ SOLN FOR KALEIDOSCOPE
2.0000 mg | INTRAVITREAL | Status: AC | PRN
  Administered 2020-07-27: 2 mg via INTRAVITREAL

## 2020-07-27 MED ORDER — AFLIBERCEPT 2MG/0.05ML IZ SOLN FOR KALEIDOSCOPE
2.0000 mg | INTRAVITREAL | Status: AC | PRN
Start: 2020-07-27 — End: 2020-07-27
  Administered 2020-07-27: 2 mg via INTRAVITREAL

## 2020-08-11 ENCOUNTER — Telehealth: Payer: Self-pay

## 2020-08-11 NOTE — Telephone Encounter (Signed)
NOTES ON FILE FROM [facility] [phone], SENT REFERRAL TO SCHEDULING °

## 2020-08-17 ENCOUNTER — Ambulatory Visit (INDEPENDENT_AMBULATORY_CARE_PROVIDER_SITE_OTHER): Payer: Medicare (Managed Care) | Admitting: Cardiology

## 2020-08-17 ENCOUNTER — Other Ambulatory Visit: Payer: Self-pay

## 2020-08-17 ENCOUNTER — Encounter: Payer: Self-pay | Admitting: Cardiology

## 2020-08-17 VITALS — BP 151/81 | HR 71 | Ht 67.0 in | Wt 192.8 lb

## 2020-08-17 DIAGNOSIS — I739 Peripheral vascular disease, unspecified: Secondary | ICD-10-CM | POA: Insufficient documentation

## 2020-08-17 DIAGNOSIS — N184 Chronic kidney disease, stage 4 (severe): Secondary | ICD-10-CM

## 2020-08-17 DIAGNOSIS — I130 Hypertensive heart and chronic kidney disease with heart failure and stage 1 through stage 4 chronic kidney disease, or unspecified chronic kidney disease: Secondary | ICD-10-CM | POA: Diagnosis not present

## 2020-08-17 DIAGNOSIS — I639 Cerebral infarction, unspecified: Secondary | ICD-10-CM | POA: Diagnosis not present

## 2020-08-17 DIAGNOSIS — E785 Hyperlipidemia, unspecified: Secondary | ICD-10-CM

## 2020-08-17 DIAGNOSIS — R079 Chest pain, unspecified: Secondary | ICD-10-CM | POA: Diagnosis not present

## 2020-08-17 DIAGNOSIS — N1832 Chronic kidney disease, stage 3b: Secondary | ICD-10-CM

## 2020-08-17 DIAGNOSIS — E1169 Type 2 diabetes mellitus with other specified complication: Secondary | ICD-10-CM

## 2020-08-17 DIAGNOSIS — R072 Precordial pain: Secondary | ICD-10-CM

## 2020-08-17 MED ORDER — ISOSORBIDE MONONITRATE ER 60 MG PO TB24: 60.0000 mg | ORAL_TABLET | Freq: Every day | ORAL | 2 refills | Status: DC

## 2020-08-17 NOTE — Progress Notes (Addendum)
Primary Care Provider: Pcp, No - not listed.  Formerly followed by the [place]  Referring Provider: Sabra Heck Fransisco Hertz, DNP Rexford Maus ([facility]) / Barney Drain, MD:  Cardiologist: No primary care provider on file. Electrophysiologist: None  Nephrologist: Elmarie Shiley, MD ([facility])  Clinic Note: Chief Complaint  Patient presents with  . New Patient (Initial Visit)    Referred by Clemens Catholic, DNP from [facility] for chest pain  . Chest Pain    Has nitroglycerin responsive chest pain with minimal exertion  . PAD    History of right toe amputation; has been told he has PAD  . 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  It does sound concerning for possible angina and progression from mild class I angina to class II or III.  He is clearly has risk factors of hypertension, hyperlipidemia, stroke, hypertensive heart disease, diabetes, and likely PAD.  He also has been a smoker  Plan:  Ensure that he is on 25 mg twice daily carvedilol  Increase Imdur to 120 mg daily  Evaluate for ischemia with a Lexiscan Myoview.  This is our only option.  Cannot walk on treadmill, if we cannot do a CTA with his CKD-4  The question next becomes well but we do have his test is positive.  We will need to discuss with Dr. Posey Pronto      Relevant Orders   EKG 12-Lead   VAS [place] LOWER EXTREMITY ARTERIAL DUPLEX   MYOCARDIAL PERFUSION IMAGING   Chronic kidney disease, stage IV (severe) (HCC) (Chronic)    Stage IV CKD with creatinine 3 becomes quite concerning feature relatively resolving what sounds like anginal symptoms and claudication.  I think probably the best evaluation would have been  with a coronary CTA given his risk factors and symptoms, this would avoid false negatives.  However he probably does have quite a bit of coronary calcium which may get blooming artifact.  With his creatinine of 3.2, we cannot give extra contrast.  This limits [place] to [employer].  Continue follow-up with Dr. Posey Pronto.      Hypertensive heart and chronic kidney disease with heart failure and stage 1 through stage 4 chronic kidney disease, or chronic kidney disease (HCC) (Chronic)    Resistant hypertension, requiring multiple medications. I think he is taking 25 twice daily of carvedilol.  Difficult to assess because he is out of restraints.  He has 25 mg tablets lisinopril 12.5 mg twice daily in the instructions.  He is also taking doxazosin along with hydralazine and Imdur.  No ACE inhibitor or ARB because of renal insufficiency and reportedly angioedema years ago.  This was from [place] now.  He actually was put on amlodipine along back in 2012 by Dr. Stanford Breed, but a normal PSA went on it.  For now, I will management of his hypertensive disease to his primary team including Dr. Posey Pronto.  They know much better than I do.  They also no medications have been tried.  At home his pressures look much better than it is here according to the housecalls nurse practitioner.  Plan: For now continue current meds but will increase Imdur to  120 mg as he is having supposedly worsening angina. He is in a strange combination of carvedilol, clonidine, doxazosin and hydralazine plus Imdur along with increased Lasix..  Diuretics are also being managed by Dr. Posey Pronto.  He is not taking very high dose of furosemide presented with his kidney disease, suspect he may be more.  He has gained weight.  I do think he may potentially consider a retrial of amlodipine.      Relevant Medications   isosorbide mononitrate (IMDUR) 60 MG 24 hr tablet   Other Relevant Orders   EKG 12-Lead   MYOCARDIAL PERFUSION IMAGING    Hyperlipidemia due to type 2 diabetes mellitus (HCC) (Chronic)    Unfortunately lipid on him from any recent checks.  He is on atorvastatin.      Relevant Medications   isosorbide mononitrate (IMDUR) 60 MG 24 hr tablet   Ischemic stroke (HCC) (Chronic)    History of stroke back in 2018, no clear source found.  No evidence to suggest there is any A. fib.  He Exam remains on aspirin and Plavix.  Supposedly his aspirin dose was reduced to 81 mg. On multiple medications for blood pressure along with statin.  No recurrent symptoms, mild residual ataxia and memory issues.      Relevant Medications   isosorbide mononitrate (IMDUR) 60 MG 24 hr tablet   PAD (peripheral artery disease) (HCC) (Chronic)    He is complaining of heaviness in his legs with walking.  Check ABIs and LEA Dopplers.      Relevant Medications   isosorbide mononitrate (IMDUR) 60 MG 24 hr tablet   Other Relevant Orders   EKG 12-Lead   VAS [place] LOWER EXTREMITY ARTERIAL DUPLEX   VAS [place] ABI WITH/WO TBI    Other Visit Diagnoses    Precordial pain       Relevant Orders   MYOCARDIAL PERFUSION IMAGING     ===================================  HPI:    John Jacobson. is a 65 y.o. male with a very dedicated PMH notable for CVA, Resistant Hypertension w/ Hypertensive Heart Disease with HFpEF, DM-2 with Peripheral Neuropathy and CKD-4 Diabetic/Hypertensive Nephropathy, Hyperlipidemia, reported PAD and "chronic stable angina ", who presents today for evaluation of "WORSENING ANGINA" at the request of Sabra Heck Fransisco Hertz, NP & Dr. Bradd Burner of [place].   Rafi A Moctezuma Sr. was seen by Dr. Stanford Breed back in December 2012 for chest pain, abnormal EKG as well as exertional dyspnea.  EKG showed RBBB.  Troponin negative during ER evaluation.  At this time was noted to have throat swelling related to Lotrel.  (Not sure whether it was the ACE inhibitor or the amlodipine. => He actually started on Norvasc with the thought that  the ACE inhibitor was the cause of angioedema. => Stress Echo Ordered.  Apparently this was done at the [place].  He never followed back up.  Pertinent Hospitalizations:   May 23-25th, 2018: Admitted for Ischemic CVA -> 6-day history of unsteadiness and weakness with uncoordination.  Apparently began after stepping on a roofing nail.  BP was 200/1 100 mmHg in ER.  CT/MRI revealed patchy multifocal acute ischemic infarction involving the superior left cerebellar hemisphere and left midbrain/pons.  Also scattered remote lacunar infarcts involving the bilateral basal ganglia and corona radiata as well as the thalamus.  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  MRI negative for large vessel occlusion. => Initiated on Plavix and aspirin.  Discharged to CIR.  Was a CIR  until June 1.  June 4-6, 2018: Worsening vertigo with ataxia.  Thought related to hypertensive urgency or orthostasis.  Blood pressure was 215/100 mmHg.  August 28-February 12, 2018: Admitted with acute on chronic HFpEF-likely hypertensive crisis.  Diuresis with Lasix.  Noted to have chest pain.  VQ negative for PE.  Likely musculoskeletal.  Worsening renal failure with A on CRF  Was referred for kidney transplant evaluation at [facility] in 2021  Seen by [facility] 03/11/2020-for chest pain, but no study results seen on Care Everywhere.  Dashel A Stoffel Sr. was last seen on February 17 and 24, 2022 by Asa Saunas, DNP for home visits to discuss blood pressure.  He was noticing progressively worsening chest discomfort symptoms all of his baseline hypertension/HFpEF symptoms.  Had gained weight, was doing sliding scale Lasix.  She describes that he has been having "stable angina symptoms for about 3 years now".  Now having worsening anginal symptoms with minimal exertion, talking a lot or postprandial. Nephrologist had recommended Lokelma for hyperkalemia  Reviewed  CV studies:    The following studies were reviewed today: (if  available, images/films reviewed: From Epic Chart or Care Everywhere) . TTE May 2018: Normal LV size and severe LVH-without LVOT gradient/S.A.M.  Normal EF 60 to 65%.?  GR 1 DD.  Mild LA dilation.   Interval History:   Hari A Castorena Sr. presents here today thankfully with a very extensive note from referring provider.  He tells he has been having chest pain now for 3 years most like she said.  But now since the good chest pain or pressure just from walking a lot.  He actually said he is having most of the very cold.  He says it feels as a pressure in his chest.  He can get it when doing just any routine walking around.  If he goes a month IVUS-he will certainly have to stop to catch his breath and will have some chest tightness.  If he tries to clean his house he will develop the symptoms.  Usually the symptoms resolve with rest.  He did not mention any use of nitroglycerin to me, but this was noted in the PCP note.  He also says occasionally he will wake up in the morning with his heart rate racing.  He does not really describe PND orthopnea.  His account does concur with the referring provider's account that he has had this chest discomfort with coughing a lot, with exertion and also after eating.  He has put on some weight and some more swelling.  He is taking more of his Lasix.  Thankfully, his diuretic has been managed by his PCP.  He is currently taking off-and-on full tablet versus half tablet of Lasix.  I questioned him about the allergy to Lotrel on, it seems unusual reactions combination of both medications and Lotrel.  I suspect is probably ACE inhibitor.  Unfortunately, for now 10 years plus, he has not been able to be on amlodipine which is relatively usual.  I think it is probably reasonable in the future to try it.  CV Review of Symptoms (Summary): positive for - chest pain, dyspnea on exertion, edema, orthopnea, palpitations, rapid heart rate and Exercise intolerance, weight gain,  fatigue negative for - paroxysmal nocturnal dyspnea or Syncope or near syncope, recurrent CVA/TIA or amaurosis fugax symptoms. Does not walk enough to no claudication, but he has noted some leg heaviness..  The patient does not have symptoms concerning for  COVID-19 infection (fever, chills, cough, or new shortness of breath).   REVIEWED OF SYSTEMS   Review of Systems  Constitutional: Positive for malaise/fatigue. Negative for weight loss (Weight gain about 9 pounds in a couple weeks).  HENT: Negative for congestion and sinus pain.   Respiratory: Positive for cough and shortness of breath (Exertion).   Cardiovascular: Positive for claudication.  Gastrointestinal: Negative for abdominal pain, blood in stool and melena.  Genitourinary: Negative for hematuria.  Musculoskeletal: Positive for joint pain. Negative for falls.  Neurological: Positive for dizziness (Poor balance). Negative for weakness.  Psychiatric/Behavioral: Positive for memory loss. Negative for depression. The patient is nervous/anxious. The patient does not have insomnia.    I have reviewed and (if needed) personally updated the patient's problem list, medications, allergies, past medical and surgical history, social and family history.   PAST MEDICAL HISTORY   Past Medical History:  Diagnosis Date  . Asthma   . Chronic kidney disease   . Chronic lower back pain 1995   Lumbar back surgery  . Diabetic retinopathy ([place])    PDR OU  . DM (diabetes mellitus) type II controlled with renal manifestation (HCC)    CKD-4, peripheral neuropathy, PAD  . Erectile dysfunction due to diabetes mellitus ([place])   . Hepatitis C   . Hyperlipidemia associated with type 2 diabetes mellitus ([place])   . Hypertensive heart disease with congestive heart failure and chronic kidney disease ([place])    TTE May 2018: Normal LV size and severe LVH-without LVOT gradient/S.A.M.  Normal EF 60 to 65%.?  GR 1 DD.  Mild LA dilation.;  CKD IV  - Cr 3.2  .  Hypertensive retinopathy    OU  . RBBB (right bundle branch block)   . Resistant hypertension    Managed by Dr. Posey Pronto from nephrology and [facility]  . Secondary hyperaldosteronism ([place])   . Stroke [facility]) 10/2016   Has residual ataxia and partial left-sided hemiparesis    PAST SURGICAL HISTORY   Past Surgical History:  Procedure Laterality Date  . BACK SURGERY  1995  . CATARACT EXTRACTION Bilateral   . EYE SURGERY Bilateral    Cat Sx  . PARS PLANA VITRECTOMY Left 01/25/2017  . TOE AMPUTATION Right 2016   Right second toe; dry gangrene  . TRANSTHORACIC ECHOCARDIOGRAM  10/2016   TTE May 2018: Normal LV size and severe LVH-without LVOT gradient/S.A.M.  Normal EF 60 to 65%.?  GR 1 DD.  Mild LA dilation.    Immunization History  Administered Date(s) Administered  . Tdap 11/02/2016    MEDICATIONS/ALLERGIES   Current Meds  Medication Sig  . acetaminophen (TYLENOL) 500 MG tablet Take 500 mg by mouth 2 (two) times daily as needed (for pain or headaches).  Marland Kitchen aspirin EC 325 MG EC tablet Take 1 tablet (325 mg total) by mouth daily.  Marland Kitchen atorvastatin (LIPITOR) 20 MG tablet Take 20 mg by mouth daily.  . carboxymethylcellulose 1 % ophthalmic solution Place 1 drop into both eyes 4 (four) times daily.  . carvedilol (COREG) 25 MG tablet Take 25 mg by mouth every 12 (twelve) hours.  . Cholecalciferol (VITAMIN D3) 2000 units TABS Take 4,000 Units by mouth daily.  . cloNIDine (CATAPRES) 0.3 MG tablet Take 1 tablet (0.3 mg total) by mouth 3 (three) times daily.  . Cyanocobalamin (VITAMIN B-12 PO) Take 1 tablet by mouth daily.  Marland Kitchen doxazosin (CARDURA) 8 MG tablet Take 4 mg by mouth 2 (two) times daily.  . furosemide (  LASIX) 40 MG tablet Take 0.5 tablets (20 mg total) by mouth daily.  Marland Kitchen glucose 4 GM chewable tablet Chew 1 tablet by mouth daily as needed for low blood sugar.  . hydrALAZINE (APRESOLINE) 100 MG tablet Take 1 tablet (100 mg total) by mouth 3 (three) times daily.  Marland Kitchen  HYDROcodone-acetaminophen (NORCO/VICODIN) 5-325 MG tablet Take 1 tablet by mouth every 6 (six) hours as needed.  . insulin glargine (LANTUS) 100 unit/mL SOPN Inject 0.3 mLs (30 Units total) into the skin at bedtime.  . polyethylene glycol (MIRALAX / GLYCOLAX) 17 g packet Take 17 g by mouth daily as needed.  . senna (SENOKOT) 8.6 MG TABS tablet Take 1 tablet by mouth daily as needed for mild constipation.  . Skin Protectants, Misc. (EUCERIN) cream Apply 1 application topically See admin instructions. Apply to both feet daily after showering  . sodium zirconium cyclosilicate (LOKELMA) 10 g PACK packet Take 10 g by mouth daily.   Current Facility-Administered Medications for the 08/17/20 encounter (Office Visit) with Leonie Man, MD  Medication  . Bevacizumab (AVASTIN) SOLN 1.25 mg  . Bevacizumab (AVASTIN) SOLN 1.25 mg  . Bevacizumab (AVASTIN) SOLN 1.25 mg  . Bevacizumab (AVASTIN) SOLN 1.25 mg    Allergies  Allergen Reactions  . Amlodipine Besy-Benazepril Hcl Anaphylaxis, Shortness Of Breath and Swelling    Mouth and tongue swelling  . Shellfish Allergy Anaphylaxis, Shortness Of Breath and Swelling    SOCIAL HISTORY/FAMILY HISTORY   Reviewed in Epic:  Pertinent findings:  Social History   Tobacco Use  . Smoking status: Former Smoker    Packs/day: 0.25    Types: Cigarettes    Quit date: 01/07/2018    Years since quitting: 2.6  . Smokeless tobacco: Never Used  Substance Use Topics  . Alcohol use: No    Comment: Few beers every other day hx  . Drug use: No   Social History   Social History Narrative   Lives a home-currently alone.     College grad.-Former Optician, dispensing at [employer], and also Scientist, clinical (histocompatibility and immunogenetics).   Children - 3 with 4 grandchildren and one great grandson.   Caffeine 2-3 cups coffee / wk former smoker of less than a pack a week.  Quit in 2019      He usually was quite active walking etc.  Now mild able to do anything for the last  several months.    OBJCTIVE -PE, EKG, labs   Wt Readings from Last 3 Encounters:  08/17/20 192 lb 12.8 oz (87.5 kg)  02/12/18 166 lb (75.3 kg)  12/28/16 179 lb 9.6 oz (81.5 kg)    Physical Exam: BP (!) 151/81   Pulse 71   Ht 5\' 7"  (4.742 m)   Wt 192 lb 12.8 oz (87.5 kg)   SpO2 97%   BMI 30.20 kg/m  Physical Exam Vitals reviewed.  Constitutional:      General: He is not in acute distress.    Appearance: He is ill-appearing (Chronically ill-appearing). He is not toxic-appearing.     Comments: Well-groomed.  HENT:     Head: Normocephalic and atraumatic.  Neck:     Vascular: JVD (7-8 cmH2O.) present. No carotid bruit (But does have a radiated murmur.).  Cardiovascular:     Rate and Rhythm: Normal rate and regular rhythm. Occasional extrasystoles are present.    Chest Wall: PMI is not displaced (Difficult to palpate).     Pulses: Decreased pulses (Diminished PT pulses  and popliteal pulses.).     Heart sounds: Heart sounds are distant. Murmur heard.  High-pitched harsh crescendo-decrescendo early systolic murmur is present with a grade of 1/6 at the upper right sternal border radiating to the neck. No friction rub. Gallop present. S4 sounds present.      Comments: Normal S1 with physiologically split S2 Pulmonary:     Effort: Pulmonary effort is normal. No respiratory distress.     Comments: Mildly diminished bibasal breath sounds.  No rales or rhonchi. Chest:     Chest wall: No tenderness.  Abdominal:     General: Bowel sounds are normal. There is no distension.     Palpations: Abdomen is soft. There is no mass.     Comments: No HSM or HJR  Musculoskeletal:        General: Swelling (Mild bilateral 1+) present.     Cervical back: Normal range of motion and neck supple.  Skin:    General: Skin is warm and dry.  Neurological:     General: No focal deficit present.     Mental Status: He is alert and oriented to person, place, and time.     Cranial Nerves: No cranial nerve  deficit.  Psychiatric:        Mood and Affect: Mood normal.        Thought Content: Thought content normal.        Judgment: Judgment normal.     Comments: Some ataxic we will relatively poor historian.  Somewhat flat affect.     Adult ECG Report  Rate: 71 ;  Rhythm: normal sinus rhythm and 1  AVB.  RBBB.  Very unusual QRS complex with some mild ST-T wave changes consistent with repolarization changes.;   Narrative Interpretation: Relatively stable  Recent Labs:    03/04/2020:[facility]  CBC: W 11.3, H/H 12.4/37.4, K+ 6.9, Cl- 1086, HCO3-17, BUN 36, Cr 2.39 (GFR 21), Glu 100,   => treated with furosemide and Kayexalate, recheck K+, BUN/Cr on 03/05/2020 = 6/ 35/ 3.2 => 03/10/2020: 4.9, 27, 345 Labs are being followed by nephrology.  Unavailable.   Lab Results  Component Value Date   CHOL 149 11/03/2016   HDL 37 (L) 11/03/2016   LDLCALC 94 11/03/2016   TRIG 91 11/03/2016   CHOLHDL 4.0 11/03/2016   Lab Results  Component Value Date   CREATININE 4.57 (H) 02/12/2018   BUN 71 (H) 02/12/2018   NA 139 02/12/2018   K 4.1 02/12/2018   CL 102 02/12/2018   CO2 27 02/12/2018   CBC Latest Ref Rng & Units 02/12/2018 02/10/2018 02/07/2018  WBC 4.0 - 10.5 K/uL 11.9(H) 12.0(H) 11.9(H)  Hemoglobin 13.0 - 17.0 g/dL 10.2(L) 10.7(L) 11.1(L)  Hematocrit 39.0 - 52.0 % 31.6(L) 34.0(L) 34.3(L)  Platelets 150 - 400 K/uL 172 197 185    Lab Results  Component Value Date   TSH 9.461 (H) 03/29/2016    ==================================================  COVID-19 Education: The signs and symptoms of COVID-19 were discussed with the patient and how to seek care for testing (follow up with PCP or arrange E-visit).   The importance of social distancing and COVID-19 vaccination was discussed today. The patient is practicing social distancing & Masking.   I spent a total of 82minutes with the patient spent in direct patient consultation.  Additional time spent with chart review  / charting  (studies, outside notes, etc): 35 min Total Time: 70 min   Current medicines are reviewed at length with the patient  today.  (+/- concerns) n/a  This visit occurred during the SARS-CoV-2 public health emergency.  Safety protocols were in place, including screening questions prior to the visit, additional usage of staff PPE, and extensive cleaning of exam room while observing appropriate contact time as indicated for disinfecting solutions.  Notice: This dictation was prepared with Dragon dictation along with smaller phrase technology. Any transcriptional errors that result from this process are unintentional and may not be corrected upon review.  Patient Instructions / Medication Changes & Studies & Tests Ordered   Shared Decision Making/Informed Consent{ The risks [chest pain, shortness of breath, cardiac arrhythmias, dizziness, blood pressure fluctuations, myocardial infarction, stroke/transient ischemic attack, nausea, vomiting, allergic reaction, radiation exposure, metallic taste sensation and life-threatening complications (estimated to be 1 in 10,000)], benefits (risk stratification, diagnosing coronary artery disease, treatment guidance) and alternatives of a nuclear stress test were discussed in detail with Mr. Mcenery and he agrees to proceed.   Patient Instructions  Medication Instructions:  No change   *If you need a refill on your cardiac medications before your next appointment, please call your pharmacy*   Lab Work: No lab work     Testing/Procedures: Will be schedule at [facility] has requested that you have a lexiscan myoview.  Please follow instruction sheet, as given.  And  Your physician has requested that you have an ankle brachial index (ABI). During this test an ultrasound and blood pressure cuff are used to evaluate the arteries that supply the arms and legs with blood. Allow thirty minutes for this exam. There are no  restrictions or special instructions. And  Your physician has requested that you have a lower extremity arterial duplex. This test is an ultrasound of the arteries in the legs. It looks at arterial blood flow in the legs. Allow one hour for Lower Arterial scans. There are no restrictions or special instructions     Follow-Up: At [facility], you and your health needs are our priority.  As part of our continuing mission to provide you with exceptional heart care, we have created designated Provider Care Teams.  These Care Teams include your primary Cardiologist (physician) and Advanced Practice Providers (APPs -  Physician Assistants and Nurse Practitioners) who all work together to provide you with the care you need, when you need it.     Your next appointment:   1 month(s)  The format for your next appointment:   In Person  Provider:   Glenetta Hew, MD     Studies Ordered:   Orders Placed This Encounter  Procedures  . MYOCARDIAL PERFUSION IMAGING  . EKG 12-Lead  . VAS [place] LOWER EXTREMITY ARTERIAL DUPLEX  . VAS [place] ABI WITH/WO TBI     Glenetta Hew, M.D., M.S. Interventional Cardiologist   Pager # [phone] Phone # [phone] [address] [place],  [address]   Thank you for choosing [facility] at [facility]!!

## 2020-08-17 NOTE — Patient Instructions (Addendum)
Medication Instructions:  No change   *If you need a refill on your cardiac medications before your next appointment, please call your pharmacy*   Lab Work: No lab work     Testing/Procedures: Will be schedule at [place] has requested that you have a lexiscan myoview.  Please follow instruction sheet, as given.  And  Your physician has requested that you have an ankle brachial index (ABI). During this test an ultrasound and blood pressure cuff are used to evaluate the arteries that supply the arms and legs with blood. Allow thirty minutes for this exam. There are no restrictions or special instructions. And  Your physician has requested that you have a lower extremity arterial duplex. This test is an ultrasound of the arteries in the legs. It looks at arterial blood flow in the legs. Allow one hour for Lower Arterial scans. There are no restrictions or special instructions     Follow-Up: At [facility], you and your health needs are our priority.  As part of our continuing mission to provide you with exceptional heart care, we have created designated Provider Care Teams.  These Care Teams include your primary Cardiologist (physician) and Advanced Practice Providers (APPs -  Physician Assistants and Nurse Practitioners) who all work together to provide you with the care you need, when you need it.     Your next appointment:   1 month(s)  The format for your next appointment:   In Person  Provider:   Glenetta Hew, MD

## 2020-08-18 ENCOUNTER — Encounter: Payer: Self-pay | Admitting: Cardiology

## 2020-08-18 ENCOUNTER — Other Ambulatory Visit: Payer: Self-pay | Admitting: *Deleted

## 2020-08-18 DIAGNOSIS — R079 Chest pain, unspecified: Secondary | ICD-10-CM

## 2020-08-18 DIAGNOSIS — I248 Other forms of acute ischemic heart disease: Secondary | ICD-10-CM

## 2020-08-18 DIAGNOSIS — D649 Anemia, unspecified: Secondary | ICD-10-CM

## 2020-08-18 DIAGNOSIS — I130 Hypertensive heart and chronic kidney disease with heart failure and stage 1 through stage 4 chronic kidney disease, or unspecified chronic kidney disease: Secondary | ICD-10-CM

## 2020-08-18 NOTE — Assessment & Plan Note (Addendum)
Resistant hypertension, requiring multiple medications. I think he is taking 25 twice daily of carvedilol.  Difficult to assess because he is out of restraints.  He has 25 mg tablets lisinopril 12.5 mg twice daily in the instructions.  He is also taking doxazosin along with hydralazine and Imdur.  No ACE inhibitor or ARB because of renal insufficiency and reportedly angioedema years ago.  This was from [place] now.  He actually was put on amlodipine along back in 2012 by Dr. Stanford Breed, but a normal PSA went on it.  For now, I will management of his hypertensive disease to his primary team including Dr. Posey Pronto.  They know much better than I do.  They also no medications have been tried.  At home his pressures look much better than it is here according to the housecalls nurse practitioner.  Plan: For now continue current meds but will increase Imdur to 120 mg as he is having supposedly worsening angina. He is in a strange combination of carvedilol, clonidine, doxazosin and hydralazine plus Imdur along with increased Lasix..  Diuretics are also being managed by Dr. Posey Pronto.  He is not taking very high dose of furosemide presented with his kidney disease, suspect he may be more.  He has gained weight.  I do think he may potentially consider a retrial of amlodipine.

## 2020-08-18 NOTE — Assessment & Plan Note (Signed)
Unfortunately lipid on him from any recent checks.  He is on atorvastatin.

## 2020-08-18 NOTE — Progress Notes (Unsigned)
Dr Cyndi Lennert sign attestation for [employer]

## 2020-08-18 NOTE — Assessment & Plan Note (Addendum)
He is complaining of heaviness in his legs with walking.  Check ABIs and LEA Dopplers.

## 2020-08-18 NOTE — Assessment & Plan Note (Signed)
Stage IV CKD with creatinine 3 becomes quite concerning feature relatively resolving what sounds like anginal symptoms and claudication.  I think probably the best evaluation would have been with a coronary CTA given his risk factors and symptoms, this would avoid false negatives.  However he probably does have quite a bit of coronary calcium which may get blooming artifact.  With his creatinine of 3.2, we cannot give extra contrast.  This limits [place] to [employer].  Continue follow-up with Dr. Posey Pronto.

## 2020-08-18 NOTE — Assessment & Plan Note (Signed)
History of stroke back in 2018, no clear source found.  No evidence to suggest there is any A. fib.  He Exam remains on aspirin and Plavix.  Supposedly his aspirin dose was reduced to 81 mg. On multiple medications for blood pressure along with statin.  No recurrent symptoms, mild residual ataxia and memory issues.

## 2020-08-18 NOTE — Assessment & Plan Note (Signed)
He is having this chronic symptom of chest pain which seem to be worse now with exertion, but talking and postprandial.  It does sound concerning for possible angina and progression from mild class I angina to class II or III.  He is clearly has risk factors of hypertension, hyperlipidemia, stroke, hypertensive heart disease, diabetes, and likely PAD.  He also has been a smoker  Plan:  Ensure that he is on 25 mg twice daily carvedilol  Increase Imdur to 120 mg daily  Evaluate for ischemia with a Lexiscan Myoview.  This is our only option.  Cannot walk on treadmill, if we cannot do a CTA with his CKD-4  The question next becomes well but we do have his test is positive.  We will need to discuss with Dr. Posey Pronto

## 2020-08-26 ENCOUNTER — Telehealth (HOSPITAL_COMMUNITY): Payer: Self-pay | Admitting: *Deleted

## 2020-08-26 NOTE — Telephone Encounter (Signed)
Close encounter 

## 2020-08-27 ENCOUNTER — Encounter (HOSPITAL_COMMUNITY): Payer: Medicare (Managed Care)

## 2020-08-27 ENCOUNTER — Ambulatory Visit (HOSPITAL_BASED_OUTPATIENT_CLINIC_OR_DEPARTMENT_OTHER)
Admission: RE | Admit: 2020-08-27 | Discharge: 2020-08-27 | Disposition: A | Discharge status: Home / Self Care | Payer: Medicare (Managed Care) | Source: Ambulatory Visit | Attending: Cardiology | Admitting: Cardiology

## 2020-08-27 ENCOUNTER — Other Ambulatory Visit: Payer: Self-pay

## 2020-08-27 ENCOUNTER — Ambulatory Visit (HOSPITAL_COMMUNITY)
Admission: RE | Admit: 2020-08-27 | Discharge: 2020-08-27 | Disposition: A | Discharge status: Home / Self Care | Payer: Medicare (Managed Care) | Source: Ambulatory Visit | Attending: Internal Medicine | Admitting: Internal Medicine

## 2020-08-27 DIAGNOSIS — R072 Precordial pain: Secondary | ICD-10-CM | POA: Insufficient documentation

## 2020-08-27 DIAGNOSIS — R079 Chest pain, unspecified: Secondary | ICD-10-CM | POA: Insufficient documentation

## 2020-08-27 DIAGNOSIS — I739 Peripheral vascular disease, unspecified: Secondary | ICD-10-CM

## 2020-08-27 DIAGNOSIS — I130 Hypertensive heart and chronic kidney disease with heart failure and stage 1 through stage 4 chronic kidney disease, or unspecified chronic kidney disease: Secondary | ICD-10-CM | POA: Diagnosis not present

## 2020-08-27 LAB — MYOCARDIAL PERFUSION IMAGING
LV dias vol: 44 mL (ref 62–150)
LV sys vol: 44 mL
Peak HR: 92 {beats}/min
Rest HR: 61 {beats}/min
SDS: 1
SRS: 0
SSS: 1
TID: 0.96

## 2020-08-27 MED ORDER — TECHNETIUM TC 99M TETROFOSMIN IV KIT
30.1000 | PACK | Freq: Once | INTRAVENOUS | Status: AC | PRN
  Administered 2020-08-27: 30.1 via INTRAVENOUS
  Filled 2020-08-27: qty 31

## 2020-08-27 MED ORDER — REGADENOSON 0.4 MG/5ML IV SOLN
0.4000 mg | Freq: Once | INTRAVENOUS | Status: AC
  Administered 2020-08-27: 0.4 mg via INTRAVENOUS

## 2020-08-27 MED ORDER — TECHNETIUM TC 99M TETROFOSMIN IV KIT
10.4000 | PACK | Freq: Once | INTRAVENOUS | Status: AC | PRN
  Administered 2020-08-27: 10.4 via INTRAVENOUS
  Filled 2020-08-27: qty 11

## 2020-08-27 MED ORDER — AMINOPHYLLINE 25 MG/ML IV SOLN
75.0000 mg | Freq: Once | INTRAVENOUS | Status: AC
  Administered 2020-08-27: 75 mg via INTRAVENOUS

## 2020-09-17 NOTE — Progress Notes (Signed)
[facility] Note  09/21/2020     CHIEF COMPLAINT Patient presents for Retina Follow Up   HISTORY OF PRESENT ILLNESS: John A Kamarrion Stfort. is a 65 y.o. male who presents to the clinic today for:   HPI    Retina Follow Up    Patient presents with  Diabetic Retinopathy.  In both eyes.  This started weeks ago.  Severity is moderate.  Duration of weeks.  Since onset it is stable.  I, the attending physician,  performed the HPI with the patient and updated documentation appropriately.          Comments    Pt states vision is about the same OU.  Pt denies eye pain or discomfort and denies any new or worsening floaters or fol OU.       Last edited by Bernarda Caffey, MD on 09/21/2020  2:15 PM. (History)    Patient   Referring physician: No referring provider defined for this encounter.  HISTORICAL INFORMATION:   Selected notes from the MEDICAL RECORD NUMBER Referred by Dr. Dorian Pod [facility]) for DM exam LEE:  Ocular Hx- PMH-    CURRENT MEDICATIONS: Current Outpatient Medications (Ophthalmic Drugs)  Medication Sig  . carboxymethylcellulose 1 % ophthalmic solution Place 1 drop into both eyes 4 (four) times daily.   No current facility-administered medications for this visit. (Ophthalmic Drugs)   Current Outpatient Medications (Other)  Medication Sig  . acetaminophen (TYLENOL) 500 MG tablet Take 500 mg by mouth 2 (two) times daily as needed (for pain or headaches).  Marland Kitchen aspirin EC 325 MG EC tablet Take 1 tablet (325 mg total) by mouth daily.  Marland Kitchen atorvastatin (LIPITOR) 20 MG tablet Take 20 mg by mouth daily.  . carvedilol (COREG) 25 MG tablet Take 25 mg by mouth every 12 (twelve) hours.  . Cholecalciferol (VITAMIN D3) 2000 units TABS Take 4,000 Units by mouth daily.  . cloNIDine (CATAPRES) 0.3 MG tablet Take 1 tablet (0.3 mg total) by mouth 3 (three) times daily.  . Cyanocobalamin (VITAMIN B-12 PO) Take 1 tablet by mouth daily.  Marland Kitchen  doxazosin (CARDURA) 8 MG tablet Take 4 mg by mouth 2 (two) times daily.  . furosemide (LASIX) 40 MG tablet Take 0.5 tablets (20 mg total) by mouth daily.  Marland Kitchen glucose 4 GM chewable tablet Chew 1 tablet by mouth daily as needed for low blood sugar.  . hydrALAZINE (APRESOLINE) 100 MG tablet Take 1 tablet (100 mg total) by mouth 3 (three) times daily.  Marland Kitchen HYDROcodone-acetaminophen (NORCO/VICODIN) 5-325 MG tablet Take 1 tablet by mouth every 6 (six) hours as needed.  . insulin glargine (LANTUS) 100 unit/mL SOPN Inject 0.3 mLs (30 Units total) into the skin at bedtime.  . isosorbide mononitrate (IMDUR) 60 MG 24 hr tablet Take 1 tablet (60 mg total) by mouth daily.  . polyethylene glycol (MIRALAX / GLYCOLAX) 17 g packet Take 17 g by mouth daily as needed.  . senna (SENOKOT) 8.6 MG TABS tablet Take 1 tablet by mouth daily as needed for mild constipation.  . Skin Protectants, Misc. (EUCERIN) cream Apply 1 application topically See admin instructions. Apply to both feet daily after showering  . sodium zirconium cyclosilicate (LOKELMA) 10 g PACK packet Take 10 g by mouth daily.   Current Facility-Administered Medications (Other)  Medication Route  . Bevacizumab (AVASTIN) SOLN 1.25 mg Intravitreal  . Bevacizumab (AVASTIN) SOLN 1.25 mg Intravitreal  . Bevacizumab (AVASTIN) SOLN 1.25 mg Intravitreal  .  Bevacizumab (AVASTIN) SOLN 1.25 mg Intravitreal      REVIEW OF SYSTEMS: ROS    Positive for: Neurological, Genitourinary, Endocrine, Cardiovascular, Eyes   Negative for: Constitutional, Gastrointestinal, Skin, Musculoskeletal, HENT, Respiratory, Psychiatric, Allergic/Imm, Heme/Lymph   Last edited by Doneen Poisson on 09/21/2020  1:43 PM. (History)       ALLERGIES Allergies  Allergen Reactions  . Amlodipine Besy-Benazepril Hcl Anaphylaxis, Shortness Of Breath and Swelling    Mouth and tongue swelling  . Shellfish Allergy Anaphylaxis, Shortness Of Breath and Swelling    PAST MEDICAL  HISTORY Past Medical History:  Diagnosis Date  . Asthma   . Chronic kidney disease   . Chronic lower back pain 1995   Lumbar back surgery  . Diabetic retinopathy ([place])    PDR OU  . DM (diabetes mellitus) type II controlled with renal manifestation (HCC)    CKD-4, peripheral neuropathy, PAD  . Erectile dysfunction due to diabetes mellitus ([place])   . Hepatitis C   . Hyperlipidemia associated with type 2 diabetes mellitus ([place])   . 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  Normal EF 60 to 65%.?  GR 1 DD.  Mild LA dilation.;  CKD IV  - Cr 3.2  . Hypertensive retinopathy    OU  . RBBB (right bundle branch block)   . Resistant hypertension    Managed by Dr. Posey Pronto [facility]  . Secondary hyperaldosteronism ([place])   . Stroke [facility]) 10/2016   Has residual ataxia and partial left-sided hemiparesis   Past Surgical History:  Procedure Laterality Date  . BACK SURGERY  1995  . CATARACT EXTRACTION Bilateral   . EYE SURGERY Bilateral    Cat Sx  . PARS PLANA VITRECTOMY Left 01/25/2017  . TOE AMPUTATION Right 2016   Right second toe; dry gangrene  . TRANSTHORACIC ECHOCARDIOGRAM  10/2016   TTE May 2018: Normal LV size and severe LVH-without LVOT gradient/S.A.M.  Normal EF 60 to 65%.?  GR 1 DD.  Mild LA dilation.    FAMILY HISTORY Family History  Problem Relation Age of Onset  . Coronary artery disease Mother        MI in her 66s  . Diabetes Mother   . Macular degeneration Mother   . Hypertension Other   . Diabetes Other   . Alzheimer's disease Other   . Coronary artery disease Brother   . Diabetes Brother   . Diabetes Sister     SOCIAL HISTORY Social History   Tobacco Use  . Smoking status: Former Smoker    Packs/day: 0.25    Types: Cigarettes    Quit date: 01/07/2018    Years since quitting: 2.7  . Smokeless tobacco: Never Used   Substance Use Topics  . Alcohol use: No    Comment: Few beers every other day hx  . Drug use: No         OPHTHALMIC EXAM:  Base Eye Exam    Visual Acuity (Snellen - Linear)      Right Left   Dist [place] 20/50 -2 20/20   Dist ph Long 20/50 +1        Tonometry (Tonopen, 1:42 PM)      Right Left   Pressure 15 14       Pupils      Dark Light Shape React APD   Right 3  2 Round Minimal 0   Left 3 2 Round Minimal 0       Visual Fields      Left Right    Full Full       Extraocular Movement      Right Left    Full Full       Neuro/Psych    Oriented x3: Yes   Mood/Affect: Normal       Dilation    Both eyes: 1.0% Mydriacyl, 2.5% Phenylephrine @ 1:42 PM        Slit Lamp and Fundus Exam    Slit Lamp Exam      Right Left   Lids/Lashes Dermatochalasis - upper lid, mild Meibomian gland dysfunction Dermatochalasis - upper lid, mild Meibomian gland dysfunction   Conjunctiva/Sclera nasal Pinguecula, Melanosis nasal/temporal Pinguecula, Melanosis   Cornea 1+ Punctate epithelial erosions, well healed temporal cataract wounds, Debris in tear film Mild Debris in tear film, 1+ Punctate epithelial erosions nasal and inferior, well healed cataract wounds   Anterior Chamber deep, narrow temporal angle, no cell or flare deep, narrow temporal angle; no cell/flare   Iris Round and poorly dilated to 5.81m, No NVI Round and poorly dilated to 5.243m No NVI   Lens PC IOL in good position PC IOL in good position, mild PC folds   Vitreous Vitreous syneresis post vitrectomy       Fundus Exam      Right Left   Disc mild pallor, sharp rim mild pallor, sharp rim   C/D Ratio 0.5 0.5   Macula Blunted foveal reflex, scattered MA/DBH - improved, +Epiretinal membrane, persistent cystic changes -- improving; scattered exudates - improving; mild scattered fibrosis Flat, blunted foveal reflex, scattered Microaneurysms, temporal macula very ischemic, persistent edema/cystic changes with punctate  exudates, temporal mac -- slighlty improved   Vessels severe Vascular attenuation, sclerotic arterioles, +AV crossing changes, Copper wiring, focal fibrosis superior to disc Vascular attenuation, mild Tortuousity   Periphery Attached, 360 PRP, good posterior laser fill in, scattered IRH Attached, good 360 PRP in place, scattered IRH          IMAGING AND PROCEDURES  Imaging and Procedures for _0 @  OCT, Retina - OU - Both Eyes       Right Eye Quality was good. Central Foveal Thickness: 240. Progression has improved. Findings include abnormal foveal contour, epiretinal membrane, no SRF, outer retinal atrophy, intraretinal fluid, macular pucker, intraretinal hyper-reflective material (Mild interval improvement in IRF/cystic changes, +ERM).   Left Eye Quality was good. Central Foveal Thickness: 244. Progression has improved. Findings include abnormal foveal contour, intraretinal fluid, no SRF, epiretinal membrane, outer retinal atrophy, inner retinal atrophy (interval improvement in temporal IRF).   Notes *Images captured and stored on drive  Diagnosis / Impression:  OU: ERM, diffuse atrophy, +DME OD: Mild interval improvement in IRF/cystic changes, +ERM OS: interval improvement in temporal IRF  Clinical management:  See below  Abbreviations: NFP - Normal foveal profile. CME - cystoid macular edema. PED - pigment epithelial detachment. IRF - intraretinal fluid. SRF - subretinal fluid. EZ - ellipsoid zone. ERM - epiretinal membrane. ORA - outer retinal atrophy. ORT - outer retinal tubulation. SRHM - subretinal hyper-reflective material        Intravitreal Injection, Pharmacologic Agent - OD - Right Eye       Time Out 09/21/2020. 2:07 PM. Confirmed correct patient, procedure, site, and patient consented.   Anesthesia Topical anesthesia was used. Anesthetic medications included Lidocaine 2%, Proparacaine 0.5%.   Procedure Preparation  included 5% betadine to ocular surface,  eyelid speculum. A (32 g) needle was used.   Injection:  2 mg aflibercept Alfonse Flavors) SOLN   NDC: A3590391, Lot: [id number], Expiration date: 05/12/2021   Route: Intravitreal, Site: Right Eye, Waste: 0.05 mL  Post-op Post injection exam found visual acuity of at least counting fingers. The patient tolerated the procedure well. There were no complications. The patient received written and verbal post procedure care education. Post injection medications were not given.        Intravitreal Injection, Pharmacologic Agent - OS - Left Eye       Time Out 09/21/2020. 2:07 PM. Confirmed correct patient, procedure, site, and patient consented.   Anesthesia Topical anesthesia was used. Anesthetic medications included Lidocaine 2%, Proparacaine 0.5%.   Procedure Preparation included 5% betadine to ocular surface, eyelid speculum. A (32 g) needle was used.   Injection:  2 mg aflibercept Alfonse Flavors) SOLN   NDC: M7179715, Lot: [id number], Expiration date: 05/12/2021   Route: Intravitreal, Site: Left Eye, Waste: 0.05 mL  Post-op Post injection exam found visual acuity of at least counting fingers. The patient tolerated the procedure well. There were no complications. The patient received written and verbal post procedure care education. Post injection medications were not given.                 ASSESSMENT/PLAN:    ICD-10-CM   1. Proliferative diabetic retinopathy of both eyes with macular edema associated with type 2 diabetes mellitus (HCC)  L46.5035 Intravitreal Injection, Pharmacologic Agent - OD - Right Eye    Intravitreal Injection, Pharmacologic Agent - OS - Left Eye    aflibercept (EYLEA) SOLN 2 mg    aflibercept (EYLEA) SOLN 2 mg  2. Retinal edema  H35.81 OCT, Retina - OU - Both Eyes  3. Epiretinal membrane (ERM) of both eyes  H35.373   4. Essential hypertension  I10   5. Hypertensive retinopathy of both eyes  H35.033   6. Pseudophakia, both eyes  Z96.1   7. Dry eyes   H04.123     1,2. Proliferative diabetic retinopathy w/ DME, OU  - former pt of Dwana Melena at [facility] and Adonis [facility] at [place] -- known history of proliferative diabetic retinopathy  - history PPV OS and laser PRP OD with Dr. Manuella Ghazi for PDR OU w/ TRD OS -- 01/25/17  - history of intravitreal anti-VEGF therapy with Dr. Anderson Malta -- last IVE ~01/2018  - s/p PRP fill in OD (02.27.20) -- good fill in laser in place  - s/p IVA OU #1 (02.13.20), #2 (03.12.20), #3 (05.10.20), #4 (06.08.20), #5 (07.06.20), #6 (08.03.20), #7 (08.31.20), #8 (09.28.20), #9 (10.26.20)  - ?resistance to IVA  - s/p IVE OU #1 (11.23.20), #2 (01.22.21), #3 (03.05.21), #4 (04.02.21), #5 (04.30.21), #6 (06.07.21), #7 (07.19.21), #8 (08.25.21), #9 (10.06.21), #10 (11.10.21), #11 (12.20.21), #12 (02.14.22)  - exam shows stable regression of fine NVD OD, PRP laser in place OU  - FA (08.31.20) shows retinal NV vastly improved OD; significant vascular perfusion defects and increased FAZ OU; late leaking MA OU  - OCT shows OU: ERM, diffuse atrophy, +DME; OD: Mild interval improvement in IRF/cystic changes, +ERM; OS: interval improvement in temporal IRF  - BCVA decreased to 20/50 from 20/40 OD, OS stable at 20/20 at 8 wks -- pt is monovision, OD: near, OS: distance  - recommend IVE OU #13 today 04.11.22 w/ f/u at 8 wks  - pt wishes to proceed  - RBA of procedure discussed,  questions answered  - informed consent obtained   - Eylea informed consent form signed and scanned on 01.22.2021  - see procedure note  - Eylea4U Benefits Investigation initiated 10.26.2020 -- approved as of 11.30.20 and verified for 2022  - [facility] has re authorized IVE injections  - f/u 8 weeks, DFE/OCT/likely injection OU -- OD eye is near eye, check vision with near card  3. Epiretinal membrane, both eyes   - mild ERM OU  - asymptomatic, no metamorphopsia  - no indication for surgery at this time  - monitor for now  4,5. Hypertensive retinopathy  OU  - discussed importance of tight BP control  - monitor  6. Pseudophakia OU  - s/p CE/IOL OU with expert surgeon, Dr. Kathlen Mody (OD: 2.25.21, OS: 03.24.21)  - beautiful surgeries, doing well  - post op drops per Dr. Kathlen Mody  - monitor  7. Dry eyes OU  - improving  - recommend artificial tears and lubricating ointment as needed  Ophthalmic Meds Ordered this visit:  Meds ordered this encounter  Medications  . aflibercept (EYLEA) SOLN 2 mg  . aflibercept (EYLEA) SOLN 2 mg      Return in about 8 weeks (around 11/16/2020) for f/u PDR OU, DFE, OCT.  There are no Patient Instructions on file for this visit.  This document serves as a record of services personally performed by Gardiner Sleeper, MD, PhD. It was created on their behalf by Leeann Must, [place], an ophthalmic technician. The creation of this record is the provider's dictation and/or activities during the visit.    Electronically signed by: Leeann Must, COA _0 @ 3:00 PM   This document serves as a record of services personally performed by Gardiner Sleeper, MD, PhD. It was created on their behalf by San Jetty. Owens Shark, OA an ophthalmic technician. The creation of this record is the provider's dictation and/or activities during the visit.    Electronically signed by: San Jetty. Owens Shark, [place] 04.11.2022 3:00 PM  Gardiner Sleeper, M.D., Ph.D. Diseases & Surgery of the Retina and [place] 09/21/2020   I have reviewed the above documentation for accuracy and completeness, and I agree with the above. Gardiner Sleeper, M.D., Ph.D. 09/21/20 3:00 PM  Abbreviations: M myopia (nearsighted); A astigmatism; H hyperopia (farsighted); P presbyopia; Mrx spectacle prescription;  CTL contact lenses; OD right eye; OS left eye; OU both eyes  XT exotropia; ET esotropia; PEK punctate epithelial keratitis; PEE punctate epithelial erosions; DES dry eye syndrome; MGD meibomian gland dysfunction; ATs artificial tears;  PFAT's preservative free artificial tears; [place] nuclear sclerotic cataract; PSC posterior subcapsular cataract; ERM epi-retinal membrane; PVD posterior vitreous detachment; RD retinal detachment; DM diabetes mellitus; DR diabetic retinopathy; NPDR non-proliferative diabetic retinopathy; PDR proliferative diabetic retinopathy; CSME clinically significant macular edema; DME diabetic macular edema; dbh dot blot hemorrhages; CWS cotton wool spot; POAG primary open angle glaucoma; C/D cup-to-disc ratio; HVF humphrey visual field; GVF goldmann visual field; OCT optical coherence tomography; IOP intraocular pressure; BRVO Branch retinal vein occlusion; CRVO central retinal vein occlusion; CRAO central retinal artery occlusion; BRAO branch retinal artery occlusion; RT retinal tear; SB scleral buckle; PPV pars plana vitrectomy; VH Vitreous hemorrhage; PRP panretinal laser photocoagulation; IVK intravitreal kenalog; VMT vitreomacular traction; MH Macular hole;  NVD neovascularization of the disc; NVE neovascularization elsewhere; AREDS age related eye disease study; ARMD age related macular degeneration; POAG primary open angle glaucoma; EBMD epithelial/anterior basement membrane dystrophy; ACIOL anterior chamber intraocular lens; IOL intraocular lens;  PCIOL posterior chamber intraocular lens; Phaco/IOL phacoemulsification with intraocular lens placement; [place] photorefractive keratectomy; LASIK laser assisted in situ keratomileusis; HTN hypertension; DM diabetes mellitus; COPD chronic obstructive pulmonary disease

## 2020-09-21 ENCOUNTER — Encounter (INDEPENDENT_AMBULATORY_CARE_PROVIDER_SITE_OTHER): Payer: Self-pay | Admitting: Ophthalmology

## 2020-09-21 ENCOUNTER — Other Ambulatory Visit: Payer: Self-pay

## 2020-09-21 ENCOUNTER — Ambulatory Visit (INDEPENDENT_AMBULATORY_CARE_PROVIDER_SITE_OTHER): Payer: Medicare (Managed Care) | Admitting: Ophthalmology

## 2020-09-21 DIAGNOSIS — E113513 Type 2 diabetes mellitus with proliferative diabetic retinopathy with macular edema, bilateral: Secondary | ICD-10-CM | POA: Diagnosis not present

## 2020-09-21 DIAGNOSIS — H3581 Retinal edema: Secondary | ICD-10-CM

## 2020-09-21 DIAGNOSIS — H35033 Hypertensive retinopathy, bilateral: Secondary | ICD-10-CM

## 2020-09-21 DIAGNOSIS — I10 Essential (primary) hypertension: Secondary | ICD-10-CM

## 2020-09-21 DIAGNOSIS — H25812 Combined forms of age-related cataract, left eye: Secondary | ICD-10-CM

## 2020-09-21 DIAGNOSIS — Z961 Presence of intraocular lens: Secondary | ICD-10-CM

## 2020-09-21 DIAGNOSIS — H35373 Puckering of macula, bilateral: Secondary | ICD-10-CM | POA: Diagnosis not present

## 2020-09-21 DIAGNOSIS — H04123 Dry eye syndrome of bilateral lacrimal glands: Secondary | ICD-10-CM

## 2020-09-21 MED ORDER — AFLIBERCEPT 2MG/0.05ML IZ SOLN FOR KALEIDOSCOPE
2.0000 mg | INTRAVITREAL | Status: AC | PRN
Start: 2020-09-21 — End: 2020-09-21
  Administered 2020-09-21: 2 mg via INTRAVITREAL

## 2020-09-21 MED ORDER — AFLIBERCEPT 2MG/0.05ML IZ SOLN FOR KALEIDOSCOPE
2.0000 mg | INTRAVITREAL | Status: AC | PRN
  Administered 2020-09-21: 2 mg via INTRAVITREAL

## 2020-09-30 ENCOUNTER — Encounter: Payer: Self-pay | Admitting: Cardiovascular Disease

## 2020-09-30 ENCOUNTER — Ambulatory Visit (INDEPENDENT_AMBULATORY_CARE_PROVIDER_SITE_OTHER): Payer: Medicare (Managed Care) | Admitting: Cardiovascular Disease

## 2020-09-30 ENCOUNTER — Other Ambulatory Visit: Payer: Self-pay

## 2020-09-30 DIAGNOSIS — I739 Peripheral vascular disease, unspecified: Secondary | ICD-10-CM

## 2020-09-30 NOTE — Progress Notes (Signed)
09/30/2020 John A Tamala Julian Sr.   19-Dec-1955  [id number]  Primary Physician Pcp, No Primary Cardiologist: Lorretta Harp MD John Jacobson, [place]  HPI:  John Jacobson. is a 65 y.o. thin appearing single African-American male father of 3 children, grandfather 4 grandchildren who is currently retired was referred through the courtesy of Dr. Ellyn Hack for evaluation of symptomatic PAD.  He does have a history of tobacco abuse having quit several years ago.  He has treated hypertension, diabetes and hyperlipidemia as well as family history of heart disease.  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  He also complains of chest pain or shortness of breath but recently had a negative Myoview.  He has a history of heart failure with preserved EF.  He does complain of numbness in his feet probably related to diabetic peripheral neuropathy as well as some heaviness when he walks.  Dopplers performed 3/70/22 revealed a right ABI of 0.71 and a left of 0.96 with primarily tibial vessel disease.   Current Meds  Medication Sig  . acetaminophen (TYLENOL) 500 MG tablet Take 500 mg by mouth 2 (two) times daily as needed (for pain or headaches).  Marland Kitchen atorvastatin (LIPITOR) 20 MG tablet Take 20 mg by mouth daily.  . carboxymethylcellulose 1 % ophthalmic solution Place 1 drop into both eyes 4 (four) times daily.  . carvedilol (COREG) 25 MG tablet Take 25 mg by mouth every 12 (twelve) hours.  . Cholecalciferol (VITAMIN D3) 2000 units TABS Take 4,000 Units by mouth daily.  . cloNIDine (CATAPRES) 0.3 MG tablet Take 1 tablet (0.3 mg total) by mouth 3 (three) times daily.  Marland Kitchen doxazosin (CARDURA) 8 MG tablet Take 4 mg by mouth 2 (two) times daily.  . furosemide (LASIX) 40 MG tablet Take 0.5 tablets (20 mg total) by mouth daily.  Marland Kitchen glucose 4 GM chewable tablet Chew 1 tablet by mouth daily as needed for low blood sugar.  . hydrALAZINE  (APRESOLINE) 100 MG tablet Take 1 tablet (100 mg total) by mouth 3 (three) times daily.  . insulin glargine (LANTUS) 100 unit/mL SOPN Inject 0.3 mLs (30 Units total) into the skin at bedtime.  . isosorbide mononitrate (IMDUR) 60 MG 24 hr tablet Take 1 tablet (60 mg total) by mouth daily.  . magnesium oxide (MAG-OX) 400 MG tablet   . polyethylene glycol (MIRALAX / GLYCOLAX) 17 g packet Take 17 g by mouth daily as needed.  . senna (SENOKOT) 8.6 MG TABS tablet Take 1 tablet by mouth daily as needed for mild constipation.  . simethicone (MYLICON) 80 MG chewable tablet   . Skin Protectants, Misc. (EUCERIN) cream Apply 1 application topically See admin instructions. Apply to both feet daily after showering  . sodium bicarbonate 650 MG tablet   . sodium zirconium cyclosilicate (LOKELMA) 10 g PACK packet Take 10 g by mouth daily.  Marland Kitchen spironolactone (ALDACTONE) 25 MG tablet    Current Facility-Administered Medications for the 09/30/20 encounter (Office Visit) with Lorretta Harp, MD  Medication  . Bevacizumab (AVASTIN) SOLN 1.25 mg  . Bevacizumab (AVASTIN) SOLN 1.25 mg  . Bevacizumab (AVASTIN) SOLN 1.25 mg  . Bevacizumab (AVASTIN) SOLN 1.25 mg     Allergies  Allergen Reactions  . Amlodipine Besy-Benazepril Hcl Anaphylaxis, Shortness Of Breath and Swelling    Mouth and tongue swelling  . Shellfish Allergy Anaphylaxis, Shortness Of Breath and Swelling  Social History   Socioeconomic History  . Marital status: Single    Spouse name: Not on file  . Number of children: 3  . Years of education: Not on file  . Highest education level: Bachelor's degree (e.g., BA, AB, BS)  Occupational History  . Not on file  Tobacco Use  . Smoking status: Former Smoker    Packs/day: 0.25    Types: Cigarettes    Quit date: 01/07/2018    Years since quitting: 2.7  . Smokeless tobacco: Never Used  Substance and Sexual Activity  . Alcohol use: No    Comment: Few beers every other day hx  . Drug use: No   . Sexual activity: Not Currently  Other Topics Concern  . Not on file  Social History Narrative   Lives a home-currently alone.     College grad.-Former Optician, dispensing at [employer], and also Scientist, clinical (histocompatibility and immunogenetics).   Children - 3 with 4 grandchildren and one great grandson.   Caffeine 2-3 cups coffee / wk former smoker of less than a pack a week.  Quit in 2019      He usually was quite active walking etc.  Now mild able to do anything for the last several months.   Social Determinants of Health   Financial Resource Strain: Not on file  Food Insecurity: Not on file  Transportation Needs: Not on file  Physical Activity: Not on file  Stress: Not on file  Social Connections: Not on file  Intimate Partner Violence: Not on file     Review of Systems: General: negative for chills, fever, night sweats or weight changes.  Cardiovascular: negative for chest pain, dyspnea on exertion, edema, orthopnea, palpitations, paroxysmal nocturnal dyspnea or shortness of breath Dermatological: negative for rash Respiratory: negative for cough or wheezing Urologic: negative for hematuria Abdominal: negative for nausea, vomiting, diarrhea, bright red blood per rectum, melena, or hematemesis Neurologic: negative for visual changes, syncope, or dizziness All other systems reviewed and are otherwise negative except as noted above.    Blood pressure 140/72, pulse (!) 58, height 5\' 7"  (1.702 m), weight 190 lb (86.2 kg), SpO2 98 %.  General appearance: alert and no distress Neck: no adenopathy, no carotid bruit, no JVD, supple, symmetrical, trachea midline and thyroid not enlarged, symmetric, no tenderness/mass/nodules Lungs: clear to auscultation bilaterally Heart: regular rate and rhythm, S1, S2 normal, no murmur, click, rub or gallop Extremities: extremities normal, atraumatic, no cyanosis or edema Pulses: 2+ and symmetric Absent pedal pulses Skin: Skin color, texture, turgor  normal. No rashes or lesions Neurologic: Alert and oriented X 3, normal strength and tone. Normal symmetric reflexes. Normal coordination and gait  EKG not performed today  ASSESSMENT AND PLAN:   PAD (peripheral artery disease) [facility]) Mr. Fetty was referred to me by Dr. Ellyn Hack for evaluation of symptomatic PAD.  He does have diabetes and symptoms of peripheral neuropathy.  He has some heaviness in his legs when he walks.  He also has stage IV CKD with serum creatinine in the 3 range.  His lower extremity arterial Doppler studies performed 08/27/2020 revealed a right ABI of 0.71 and a left of 0.96 with primarily tibial vessel disease.  There is no evidence of critical limb ischemia.  Given his chronic renal insufficiency I do not think it is prudent to pursue angiography and endovascular therapy at this time.      Lorretta Harp MD FACP,FACC,FAHA, [facility] 09/30/2020 10:42 AM

## 2020-09-30 NOTE — Assessment & Plan Note (Signed)
John Jacobson was referred to me by Dr. Ellyn Hack for evaluation of symptomatic PAD.  He does have diabetes and symptoms of peripheral neuropathy.  He has some heaviness in his legs when he walks.  He also has stage IV CKD with serum creatinine in the 3 range.  His lower extremity arterial Doppler studies performed 08/27/2020 revealed a right ABI of 0.71 and a left of 0.96 with primarily tibial vessel disease.  There is no evidence of critical limb ischemia.  Given his chronic renal insufficiency I do not think it is prudent to pursue angiography and endovascular therapy at this time.

## 2020-09-30 NOTE — Patient Instructions (Signed)
Medication Instructions:  Your physician recommends that you continue on your current medications as directed. Please refer to the Current Medication list given to you today.  *If you need a refill on your cardiac medications before your next appointment, please call your pharmacy*   Follow-Up: At [facility], you and your health needs are our priority.  As part of our continuing mission to provide you with exceptional heart care, we have created designated Provider Care Teams.  These Care Teams include your primary Cardiologist (physician) and Advanced Practice Providers (APPs -  Physician Assistants and Nurse Practitioners) who all work together to provide you with the care you need, when you need it.  We recommend signing up for the patient portal called "MyChart".  Sign up information is provided on this After Visit Summary.  MyChart is used to connect with patients for Virtual Visits (Telemedicine).  Patients are able to view lab/test results, encounter notes, upcoming appointments, etc.  Non-urgent messages can be sent to your provider as well.   To learn more about what you can do with MyChart, go to [email or web address].    Your next appointment:   No future appointments made at this time. We will see you on an as needed basis. If you need an appointment with [place] please call the office.  Provider:   Quay Burow, MD

## 2020-10-08 ENCOUNTER — Ambulatory Visit: Payer: Medicare (Managed Care) | Admitting: Cardiology

## 2020-10-22 ENCOUNTER — Ambulatory Visit: Payer: Medicare (Managed Care) | Admitting: Cardiology

## 2020-11-16 ENCOUNTER — Encounter (INDEPENDENT_AMBULATORY_CARE_PROVIDER_SITE_OTHER): Payer: Medicare (Managed Care) | Admitting: Ophthalmology

## 2020-11-16 DIAGNOSIS — H35373 Puckering of macula, bilateral: Secondary | ICD-10-CM

## 2020-11-16 DIAGNOSIS — Z961 Presence of intraocular lens: Secondary | ICD-10-CM

## 2020-11-16 DIAGNOSIS — H25813 Combined forms of age-related cataract, bilateral: Secondary | ICD-10-CM

## 2020-11-16 DIAGNOSIS — H25812 Combined forms of age-related cataract, left eye: Secondary | ICD-10-CM

## 2020-11-16 DIAGNOSIS — H35033 Hypertensive retinopathy, bilateral: Secondary | ICD-10-CM

## 2020-11-16 DIAGNOSIS — E113513 Type 2 diabetes mellitus with proliferative diabetic retinopathy with macular edema, bilateral: Secondary | ICD-10-CM

## 2020-11-16 DIAGNOSIS — I10 Essential (primary) hypertension: Secondary | ICD-10-CM

## 2020-11-16 DIAGNOSIS — H04123 Dry eye syndrome of bilateral lacrimal glands: Secondary | ICD-10-CM

## 2020-11-16 DIAGNOSIS — H3581 Retinal edema: Secondary | ICD-10-CM

## 2021-03-04 NOTE — Progress Notes (Signed)
[facility] Note  03/08/2021     CHIEF COMPLAINT Patient presents for Retina Follow Up   HISTORY OF PRESENT ILLNESS: John Jacobson. is a 65 y.o. male who presents to the clinic today for:   HPI     Retina Follow Up   Patient presents with  Diabetic Retinopathy.  In both eyes.  Severity is moderate.  Duration of 8 weeks.  Since onset it is stable.  I, the attending physician,  performed the HPI with the patient and updated documentation appropriately.        Comments   Patient states vision the same OU. Last a1c was 7.6, doesn't remember when that was last checked. BS hasn't checked today.       Last edited by Bernarda Caffey, MD on 03/08/2021 12:12 PM.     F/u delayed to 5+ mos. Patient states since he was here last, he has been dealing with CHF, but all his medications are the same, he complains of excessive tearing of both eyes  Referring physician: No referring provider defined for this encounter.  HISTORICAL INFORMATION:   Selected notes from the MEDICAL RECORD NUMBER Referred by Dr. Dorian Pod [facility]) for DM exam LEE:  Ocular Hx- PMH-    CURRENT MEDICATIONS: Current Outpatient Medications (Ophthalmic Drugs)  Medication Sig   carboxymethylcellulose 1 % ophthalmic solution Place 1 drop into both eyes 4 (four) times daily.   No current facility-administered medications for this visit. (Ophthalmic Drugs)   Current Outpatient Medications (Other)  Medication Sig   acetaminophen (TYLENOL) 500 MG tablet Take 500 mg by mouth 2 (two) times daily as needed (for pain or headaches).   atorvastatin (LIPITOR) 20 MG tablet Take 20 mg by mouth daily.   carvedilol (COREG) 25 MG tablet Take 25 mg by mouth every 12 (twelve) hours.   Cholecalciferol (VITAMIN D3) 2000 units TABS Take 4,000 Units by mouth daily.   cloNIDine (CATAPRES) 0.3 MG tablet Take 1 tablet (0.3 mg total) by mouth 3 (three) times daily.   doxazosin (CARDURA) 8  MG tablet Take 4 mg by mouth 2 (two) times daily.   furosemide (LASIX) 40 MG tablet Take 0.5 tablets (20 mg total) by mouth daily.   glucose 4 GM chewable tablet Chew 1 tablet by mouth daily as needed for low blood sugar.   hydrALAZINE (APRESOLINE) 100 MG tablet Take 1 tablet (100 mg total) by mouth 3 (three) times daily.   insulin glargine (LANTUS) 100 unit/mL SOPN Inject 0.3 mLs (30 Units total) into the skin at bedtime.   isosorbide mononitrate (IMDUR) 60 MG 24 hr tablet Take 1 tablet (60 mg total) by mouth daily.   magnesium oxide (MAG-OX) 400 MG tablet    polyethylene glycol (MIRALAX / GLYCOLAX) 17 g packet Take 17 g by mouth daily as needed.   senna (SENOKOT) 8.6 MG TABS tablet Take 1 tablet by mouth daily as needed for mild constipation.   simethicone (MYLICON) 80 MG chewable tablet    Skin Protectants, Misc. (EUCERIN) cream Apply 1 application topically See admin instructions. Apply to both feet daily after showering   sodium bicarbonate 650 MG tablet    sodium zirconium cyclosilicate (LOKELMA) 10 g PACK packet Take 10 g by mouth daily.   spironolactone (ALDACTONE) 25 MG tablet    Current Facility-Administered Medications (Other)  Medication Route   Bevacizumab (AVASTIN) SOLN 1.25 mg Intravitreal   Bevacizumab (AVASTIN) SOLN 1.25 mg Intravitreal   Bevacizumab (  AVASTIN) SOLN 1.25 mg Intravitreal   Bevacizumab (AVASTIN) SOLN 1.25 mg Intravitreal      REVIEW OF SYSTEMS: ROS   Positive for: Neurological, Genitourinary, Endocrine, Cardiovascular, Eyes Negative for: Constitutional, Gastrointestinal, Skin, Musculoskeletal, HENT, Respiratory, Psychiatric, Allergic/Imm, Heme/Lymph Last edited by Jobe Marker, COT on 03/08/2021  9:30 AM.        ALLERGIES Allergies  Allergen Reactions   Amlodipine Besy-Benazepril Hcl Anaphylaxis, Shortness Of Breath and Swelling    Mouth and tongue swelling   Shellfish Allergy Anaphylaxis, Shortness Of Breath and Swelling    PAST MEDICAL  HISTORY Past Medical History:  Diagnosis Date   Asthma    Chronic kidney disease    Chronic lower back pain 1995   Lumbar back surgery   Diabetic retinopathy ([place])    PDR OU   DM (diabetes mellitus) type II controlled with renal manifestation ([place])    CKD-4, peripheral neuropathy, PAD   Erectile dysfunction due to diabetes mellitus ([place])    Hepatitis C    Hyperlipidemia associated with type 2 diabetes mellitus ([place])    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  Normal EF 60 to 65%.?  GR 1 DD.  Mild LA dilation.;  CKD IV  - Cr 3.2   Hypertensive retinopathy    OU   RBBB (right bundle branch block)    Resistant hypertension    Managed by Dr. 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[facility])    Stroke ([place]) 10/2016   Has residual ataxia and partial left-sided hemiparesis   Past Surgical History:  Procedure Laterality Date   BACK SURGERY  1995   CATARACT EXTRACTION Bilateral    EYE SURGERY Bilateral    Cat Sx   PARS PLANA VITRECTOMY Left 01/25/2017   TOE AMPUTATION Right 2016   Right second toe; dry gangrene   TRANSTHORACIC ECHOCARDIOGRAM  10/2016   TTE May 2018: Normal LV size and severe LVH-without LVOT gradient/S.A.M.  Normal EF 60 to 65%.?  GR 1 DD.  Mild LA dilation.    FAMILY HISTORY Family History  Problem Relation Age of Onset   Coronary artery disease Mother        MI in her 45s   Diabetes Mother    Macular degeneration Mother    Hypertension Other    Diabetes Other    Alzheimer's disease Other    Coronary artery disease Brother    Diabetes Brother    Diabetes Sister     SOCIAL HISTORY Social History   Tobacco Use   Smoking status: Former    Packs/day: 0.25    Types: Cigarettes    Quit date: 01/07/2018    Years since quitting: 3.1   Smokeless tobacco: Never  Substance Use Topics   Alcohol use: No     Comment: Few beers every other day hx   Drug use: No       OPHTHALMIC EXAM:  Base Eye Exam     Visual Acuity (Snellen - Linear)       Right Left   Dist [place] 20/60 +2 20/30   Dist ph [place] NI 20/30 +2   Near [place] J16          Tonometry (Tonopen, 9:40 AM)       Right Left   Pressure 13 15         Pupils  Dark Light Shape React APD   Right 3 2 Round Minimal None   Left 3 2 Round Minimal None         Visual Fields (Counting fingers)       Left Right    Full Full         Extraocular Movement       Right Left    Full, Ortho Full, Ortho         Neuro/Psych     Oriented x3: Yes   Mood/Affect: Normal         Dilation     Both eyes: 1.0% Mydriacyl, 2.5% Phenylephrine @ 9:41 AM           Slit Lamp and Fundus Exam     Slit Lamp Exam       Right Left   Lids/Lashes Dermatochalasis - upper lid, mild Meibomian gland dysfunction Dermatochalasis - upper lid, mild Meibomian gland dysfunction   Conjunctiva/Sclera nasal Pinguecula, Melanosis nasal/temporal Pinguecula, Melanosis   Cornea Trace Punctate epithelial erosions, well healed temporal cataract wounds, mild Debris in tear film Mild Debris in tear film, 1-2+ fine Punctate epithelial erosions nasal and inferior, well healed cataract wounds, arcus   Anterior Chamber deep, narrow temporal angle, no cell or flare deep, narrow temporal angle; no cell/flare   Iris Round and poorly dilated to 5.63m, No NVI Round and poorly dilated to 5.219m No NVI   Lens PC IOL in good position PC IOL in good position, mild PC folds   Vitreous Vitreous syneresis post vitrectomy         Fundus Exam       Right Left   Disc mild pallor, sharp rim mild pallor, sharp rim   C/D Ratio 0.5 0.5   Macula Blunted foveal reflex, scattered MA/DBH - improved, +Epiretinal membrane, trace cystic changes; scattered exudates - improving; mild scattered fibrosis Flat, blunted foveal reflex, scattered Microaneurysms, temporal macula  very ischemic, persistent edema/cystic changes with punctate exudates temporal mac    Vessels severe Vascular attenuation, sclerotic arterioles, +AV crossing changes, Copper wiring, focal fibrosis superior to disc Vascular attenuation, mild Tortuousity   Periphery Attached, 360 PRP, good posterior laser fill in, scattered IRH Attached, good 360 PRP in place, scattered IRH           Refraction     Manifest Refraction       Sphere Cylinder Axis Dist VA   Right -0.50 +1.00 020 20/40-2   Left -0.50 +0.50 005 20/20            IMAGING AND PROCEDURES  Imaging and Procedures for _0 @  OCT, Retina - OU - Both Eyes       Right Eye Quality was good. Central Foveal Thickness: 233. Progression has improved. Findings include abnormal foveal contour, epiretinal membrane, no SRF, outer retinal atrophy, intraretinal fluid, macular pucker, intraretinal hyper-reflective material.   Left Eye Quality was good. Central Foveal Thickness: 239. Progression has improved. Findings include abnormal foveal contour, intraretinal fluid, no SRF, epiretinal membrane, outer retinal atrophy, inner retinal atrophy (Persistent temporal IRF).   Notes *Images captured and stored on drive  Diagnosis / Impression:  OU: ERM, diffuse atrophy, +DME OD: interval improvement in IRF - just trace cystic changes remain, +ERM OS: Persistent temporal IRF  Clinical management:  See below  Abbreviations: NFP - Normal foveal profile. CME - cystoid macular edema. PED - pigment epithelial detachment. IRF - intraretinal fluid. SRF - subretinal fluid. EZ - ellipsoid zone. ERM - epiretinal  membrane. ORA - outer retinal atrophy. ORT - outer retinal tubulation. SRHM - subretinal hyper-reflective material      Intravitreal Injection, Pharmacologic Agent - OD - Right Eye       Time Out 03/08/2021. 10:29 AM. Confirmed correct patient, procedure, site, and patient consented.   Anesthesia Topical anesthesia was used.  Anesthetic medications included Lidocaine 2%, Proparacaine 0.5%.   Procedure Preparation included 5% betadine to ocular surface, eyelid speculum. A (32 g) needle was used.   Injection: 2 mg aflibercept 2 MG/0.05ML   Route: Intravitreal, Site: Right Eye   NDC: A3590391, Lot: [id number], Expiration date: 11/11/2021, Waste: 0.05 mL   Post-op Post injection exam found visual acuity of at least counting fingers. The patient tolerated the procedure well. There were no complications. The patient received written and verbal post procedure care education. Post injection medications were not given.      Intravitreal Injection, Pharmacologic Agent - OS - Left Eye       Time Out 03/08/2021. 10:30 AM. Confirmed correct patient, procedure, site, and patient consented.   Anesthesia Topical anesthesia was used. Anesthetic medications included Lidocaine 2%, Proparacaine 0.5%.   Procedure Preparation included 5% betadine to ocular surface, eyelid speculum. A (32 g) needle was used.   Injection: 2 mg aflibercept 2 MG/0.05ML   Route: Intravitreal, Site: Left Eye   NDC: M7179715, Lot: [id number], Expiration date: 08/11/2021, Waste: 0.05 mL   Post-op Post injection exam found visual acuity of at least counting fingers. The patient tolerated the procedure well. There were no complications. The patient received written and verbal post procedure care education. Post injection medications were not given.            ASSESSMENT/PLAN:    ICD-10-CM   1. Proliferative diabetic retinopathy of both eyes with macular edema associated with type 2 diabetes mellitus (HCC)  G95.6213 Intravitreal Injection, Pharmacologic Agent - OD - Right Eye    Intravitreal Injection, Pharmacologic Agent - OS - Left Eye    aflibercept (EYLEA) SOLN 2 mg    aflibercept (EYLEA) SOLN 2 mg    2. Retinal edema  H35.81 OCT, Retina - OU - Both Eyes    3. Epiretinal membrane (ERM) of both eyes  H35.373     4. Essential  hypertension  I10     5. Hypertensive retinopathy of both eyes  H35.033     6. Pseudophakia, both eyes  Z96.1     7. Dry eyes  H04.123       1,2. Proliferative diabetic retinopathy w/ DME, OU  - lost to f/u from 4.11.22 to 9.26.22 -- 5+ mos, CHF exacerbations  - former pt of Dwana Melena at [facility] and Adonis Brook at [place] -- known history of proliferative diabetic retinopathy  - history PPV OS and laser PRP OD with Dr. Manuella Ghazi for PDR OU w/ TRD OS -- 01/25/17  - history of intravitreal anti-VEGF therapy with Dr. Anderson Malta -- last IVE ~01/2018  - s/p PRP fill in OD (02.27.20) -- good fill in laser in place  - s/p IVA OU #1 (02.13.20), #2 (03.12.20), #3 (05.10.20), #4 (06.08.20), #5 (07.06.20), #6 (08.03.20), #7 (08.31.20), #8 (09.28.20), #9 (10.26.20)  - ?resistance to IVA  - s/p IVE OU #1 (11.23.20), #2 (01.22.21), #3 (03.05.21), #4 (04.02.21), #5 (04.30.21), #6 (06.07.21), #7 (07.19.21), #8 (08.25.21), #9 (10.06.21), #10 (11.10.21), #11 (12.20.21), #12 (02.14.22), #13 (04.11.22)  - exam shows stable regression of fine NVD OD, PRP laser in place OU  - FA (08.31.20)  shows retinal NV vastly improved OD; significant vascular perfusion defects and increased FAZ OU; late leaking MA OU  - OCT shows OD: Mild interval improvement in IRF - just trace cystic changes remain; OS: interval improvement in temporal IRF at 5+ mos since last injection  - BCVA decreased to 20/60 from 20/50 OD, OS decreased to 20/30 from 20/20 at 5 mos -- pt is monovision, OD: near, OS: distance  - discussed improvement in DME despite delayed f/u  - recommend IVE OU #14 today 09.26.22 w/ f/u at 12 wks  - pt wishes to proceed  - RBA of procedure discussed, questions answered  - informed consent obtained   - Eylea informed consent form signed and scanned on 01.22.2021  - see procedure note  - Eylea4U Benefits Investigation initiated 10.26.2020 -- approved as of 11.30.20 and verified for 2022  - [facility] has re authorized  IVE injections  - f/u 12 weeks, DFE/OCT/likely injection OU -- OD eye is near eye, check vision with near card  3. Epiretinal membrane, both eyes   - mild ERM OU  - asymptomatic, no metamorphopsia  - no indication for surgery at this time  - monitor for now  4,5. Hypertensive retinopathy OU  - discussed importance of tight BP control  - monitor  6. Pseudophakia OU  - s/p CE/IOL OU with expert surgeon, Dr. Kathlen Mody (OD: 2.25.21, OS: 03.24.21)  - beautiful surgeries, doing well  - post op drops per Dr. Kathlen Mody  - monitor  7. Dry eyes OU  - improving  - recommend artificial tears and lubricating ointment as needed  Ophthalmic Meds Ordered this visit:  Meds ordered this encounter  Medications   aflibercept (EYLEA) SOLN 2 mg   aflibercept (EYLEA) SOLN 2 mg      Return in about 12 weeks (around 05/31/2021) for PDR w/ DME OU, DFE, OCT.  There are no Patient Instructions on file for this visit.   This document serves as a record of services personally performed by Gardiner Sleeper, MD, PhD. It was created on their behalf by Leeann Must, [place], an ophthalmic technician. The creation of this record is the provider's dictation and/or activities during the visit.    Electronically signed by: Leeann Must, COA _0 @ 12:18 PM  This document serves as a record of services personally performed by Gardiner Sleeper, MD, PhD. It was created on their behalf by San Jetty. Owens Shark, OA an ophthalmic technician. The creation of this record is the provider's dictation and/or activities during the visit.    Electronically signed by: San Jetty. Marguerita Merles 09.26.2022 12:18 PM  Gardiner Sleeper, M.D., Ph.D. Diseases & Surgery of the Retina and [place] 03/08/2021   I have reviewed the above documentation for accuracy and completeness, and I agree with the above. Gardiner Sleeper, M.D., Ph.D. 03/08/21 12:19 PM   Abbreviations: M myopia (nearsighted); A astigmatism;  H hyperopia (farsighted); P presbyopia; Mrx spectacle prescription;  CTL contact lenses; OD right eye; OS left eye; OU both eyes  XT exotropia; ET esotropia; PEK punctate epithelial keratitis; PEE punctate epithelial erosions; DES dry eye syndrome; MGD meibomian gland dysfunction; ATs artificial tears; PFAT's preservative free artificial tears; [place] nuclear sclerotic cataract; PSC posterior subcapsular cataract; ERM epi-retinal membrane; PVD posterior vitreous detachment; RD retinal detachment; DM diabetes mellitus; DR diabetic retinopathy; NPDR non-proliferative diabetic retinopathy; PDR proliferative diabetic retinopathy; CSME clinically significant macular edema; DME diabetic macular edema; dbh dot blot hemorrhages; CWS cotton wool  spot; POAG primary open angle glaucoma; C/D cup-to-disc ratio; HVF humphrey visual field; GVF goldmann visual field; OCT optical coherence tomography; IOP intraocular pressure; BRVO Branch retinal vein occlusion; CRVO central retinal vein occlusion; CRAO central retinal artery occlusion; BRAO branch retinal artery occlusion; RT retinal tear; SB scleral buckle; PPV pars plana vitrectomy; VH Vitreous hemorrhage; PRP panretinal laser photocoagulation; IVK intravitreal kenalog; VMT vitreomacular traction; MH Macular hole;  NVD neovascularization of the disc; NVE neovascularization elsewhere; AREDS age related eye disease study; ARMD age related macular degeneration; POAG primary open angle glaucoma; EBMD epithelial/anterior basement membrane dystrophy; ACIOL anterior chamber intraocular lens; IOL intraocular lens; PCIOL posterior chamber intraocular lens; Phaco/IOL phacoemulsification with intraocular lens placement; [place] photorefractive keratectomy; LASIK laser assisted in situ keratomileusis; HTN hypertension; DM diabetes mellitus; COPD chronic obstructive pulmonary disease

## 2021-03-08 ENCOUNTER — Other Ambulatory Visit: Payer: Self-pay

## 2021-03-08 ENCOUNTER — Ambulatory Visit (INDEPENDENT_AMBULATORY_CARE_PROVIDER_SITE_OTHER): Payer: Medicare (Managed Care) | Admitting: Ophthalmology

## 2021-03-08 ENCOUNTER — Encounter (INDEPENDENT_AMBULATORY_CARE_PROVIDER_SITE_OTHER): Payer: Self-pay | Admitting: Ophthalmology

## 2021-03-08 DIAGNOSIS — E113513 Type 2 diabetes mellitus with proliferative diabetic retinopathy with macular edema, bilateral: Secondary | ICD-10-CM | POA: Diagnosis not present

## 2021-03-08 DIAGNOSIS — H35033 Hypertensive retinopathy, bilateral: Secondary | ICD-10-CM | POA: Diagnosis not present

## 2021-03-08 DIAGNOSIS — I10 Essential (primary) hypertension: Secondary | ICD-10-CM

## 2021-03-08 DIAGNOSIS — H25812 Combined forms of age-related cataract, left eye: Secondary | ICD-10-CM

## 2021-03-08 DIAGNOSIS — H3581 Retinal edema: Secondary | ICD-10-CM

## 2021-03-08 DIAGNOSIS — H25813 Combined forms of age-related cataract, bilateral: Secondary | ICD-10-CM

## 2021-03-08 DIAGNOSIS — H04123 Dry eye syndrome of bilateral lacrimal glands: Secondary | ICD-10-CM

## 2021-03-08 DIAGNOSIS — H35373 Puckering of macula, bilateral: Secondary | ICD-10-CM | POA: Diagnosis not present

## 2021-03-08 DIAGNOSIS — Z961 Presence of intraocular lens: Secondary | ICD-10-CM

## 2021-03-08 MED ORDER — AFLIBERCEPT 2MG/0.05ML IZ SOLN FOR KALEIDOSCOPE
2.0000 mg | INTRAVITREAL | Status: AC | PRN
  Administered 2021-03-08: 2 mg via INTRAVITREAL

## 2021-05-27 NOTE — Progress Notes (Signed)
[facility] Note  05/31/2021     CHIEF COMPLAINT Patient presents for Retina Follow Up  HISTORY OF PRESENT ILLNESS: John A Urho Rio. is a 65 y.o. male who presents to the clinic today for:   HPI     Retina Follow Up   Patient presents with  Diabetic Retinopathy.  In both eyes.  This started years ago.  Severity is moderate.  Duration of 12 weeks.  Since onset it is stable.  I, the attending physician,  performed the HPI with the patient and updated documentation appropriately.        Comments   65 y/o male pt here for 12 wk f/u for PDR w/DME OU.  No change in [place] OU.  Denies pain, FOL, new floaters.  AT prn OU.  BS 190 and A1C 7.2 1 wk ago.      Last edited by Bernarda Caffey, MD on 05/31/2021  1:13 PM.      Referring physician: No referring provider defined for this encounter.  HISTORICAL INFORMATION:   Selected notes from the MEDICAL RECORD NUMBER Referred by Dr. Dorian Pod [facility]) for DM exam   CURRENT MEDICATIONS: Current Outpatient Medications (Ophthalmic Drugs)  Medication Sig   carboxymethylcellulose 1 % ophthalmic solution Place 1 drop into both eyes 4 (four) times daily.   No current facility-administered medications for this visit. (Ophthalmic Drugs)   Current Outpatient Medications (Other)  Medication Sig   acetaminophen (TYLENOL) 500 MG tablet Take 500 mg by mouth 2 (two) times daily as needed (for pain or headaches).   aspirin 81 MG EC tablet Take 1 tablet by mouth daily.   atorvastatin (LIPITOR) 20 MG tablet Take 20 mg by mouth daily.   atorvastatin (LIPITOR) 40 MG tablet Take 1 tablet by mouth daily.   carvedilol (COREG) 25 MG tablet Take 25 mg by mouth every 12 (twelve) hours.   Cholecalciferol (VITAMIN D3) 2000 units TABS Take 4,000 Units by mouth daily.   Cholecalciferol 25 MCG (1000 UT) tablet Take 1 tablet by mouth daily.   cloNIDine (CATAPRES) 0.1 MG tablet Take 1 tablet by mouth 2 (two) times daily.    cloNIDine (CATAPRES) 0.3 MG tablet Take 1 tablet (0.3 mg total) by mouth 3 (three) times daily.   doxazosin (CARDURA) 8 MG tablet Take 4 mg by mouth 2 (two) times daily.   furosemide (LASIX) 40 MG tablet Take 0.5 tablets (20 mg total) by mouth daily.   glucose 4 GM chewable tablet Chew 1 tablet by mouth daily as needed for low blood sugar.   hydrALAZINE (APRESOLINE) 100 MG tablet Take 1 tablet (100 mg total) by mouth 3 (three) times daily.   hydrALAZINE (APRESOLINE) 100 MG tablet Take 1 tablet by mouth 2 (two) times daily.   insulin glargine (LANTUS) 100 unit/mL SOPN Inject 0.3 mLs (30 Units total) into the skin at bedtime.   magnesium oxide (MAG-OX) 400 MG tablet    polyethylene glycol (MIRALAX / GLYCOLAX) 17 g packet Take 17 g by mouth daily as needed.   senna (SENOKOT) 8.6 MG TABS tablet Take 1 tablet by mouth daily as needed for mild constipation.   simethicone (MYLICON) 80 MG chewable tablet    Skin Protectants, Misc. (EUCERIN) cream Apply 1 application topically See admin instructions. Apply to both feet daily after showering   sodium bicarbonate 650 MG tablet    sodium zirconium cyclosilicate (LOKELMA) 10 g PACK packet Take 10 g by mouth daily.  spironolactone (ALDACTONE) 25 MG tablet    doxazosin (CARDURA) 8 MG tablet Take 0.5 tablets by mouth at bedtime. (Patient not taking: Reported on 05/31/2021)   isosorbide mononitrate (IMDUR) 60 MG 24 hr tablet Take 1 tablet (60 mg total) by mouth daily.   sodium bicarbonate 650 MG tablet Take 1 tablet by mouth 2 (two) times daily. (Patient not taking: Reported on 05/31/2021)   Current Facility-Administered Medications (Other)  Medication Route   Bevacizumab (AVASTIN) SOLN 1.25 mg Intravitreal   Bevacizumab (AVASTIN) SOLN 1.25 mg Intravitreal   Bevacizumab (AVASTIN) SOLN 1.25 mg Intravitreal   Bevacizumab (AVASTIN) SOLN 1.25 mg Intravitreal   REVIEW OF SYSTEMS: ROS   Positive for: Neurological, Genitourinary, Endocrine, Cardiovascular,  Eyes Negative for: Constitutional, Gastrointestinal, Skin, Musculoskeletal, HENT, Respiratory, Psychiatric, Allergic/Imm, Heme/Lymph Last edited by Matthew Folks, COA on 05/31/2021  9:37 AM.     ALLERGIES Allergies  Allergen Reactions   Amlodipine Besy-Benazepril Hcl Anaphylaxis, Shortness Of Breath and Swelling    Mouth and tongue swelling   Shellfish Allergy Anaphylaxis, Shortness Of Breath and Swelling   PAST MEDICAL HISTORY Past Medical History:  Diagnosis Date   Asthma    Chronic kidney disease    Chronic lower back pain 1995   Lumbar back surgery   Diabetic retinopathy ([place])    PDR OU   DM (diabetes mellitus) type II controlled with renal manifestation ([place])    CKD-4, peripheral neuropathy, PAD   Erectile dysfunction due to diabetes mellitus ([place])    Hepatitis C    Hyperlipidemia associated with type 2 diabetes mellitus ([place])    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  Normal EF 60 to 65%.?  GR 1 DD.  Mild LA dilation.;  CKD IV  - Cr 3.2   Hypertensive retinopathy    OU   RBBB (right bundle branch block)    Resistant hypertension    Managed by Dr. 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[facility])    Stroke ([place]) 10/2016   Has residual ataxia and partial left-sided hemiparesis   Past Surgical History:  Procedure Laterality Date   BACK SURGERY  1995   CATARACT EXTRACTION Bilateral    EYE SURGERY Bilateral    Cat Sx   PARS PLANA VITRECTOMY Left 01/25/2017   TOE AMPUTATION Right 2016   Right second toe; dry gangrene   TRANSTHORACIC ECHOCARDIOGRAM  10/2016   TTE May 2018: Normal LV size and severe LVH-without LVOT gradient/S.A.M.  Normal EF 60 to 65%.?  GR 1 DD.  Mild LA dilation.    FAMILY HISTORY Family History  Problem Relation Age of Onset   Coronary artery disease Mother        MI in her 82s    Diabetes Mother    Macular degeneration Mother    Hypertension Other    Diabetes Other    Alzheimer's disease Other    Coronary artery disease Brother    Diabetes Brother    Diabetes Sister    SOCIAL HISTORY Social History   Tobacco Use   Smoking status: Former    Packs/day: 0.25    Types: Cigarettes    Quit date: 01/07/2018    Years since quitting: 3.3   Smokeless tobacco: Never  Substance Use Topics   Alcohol use: No    Comment: Few beers every other day hx   Drug use:  No       OPHTHALMIC EXAM:  Base Eye Exam     Visual Acuity (Snellen - Linear)       Right Left   Dist [place] 20/50 +2 20/25 -2   Dist ph [place] NI NI   Near [place] J10          Tonometry (Tonopen, 9:42 AM)       Right Left   Pressure 12 13         Pupils       Dark Light Shape React APD   Right 3 2 Round Minimal None   Left 3 2 Round Minimal None         Visual Fields (Counting fingers)       Left Right    Full Full         Extraocular Movement       Right Left    Full, Ortho Full, Ortho         Neuro/Psych     Oriented x3: Yes   Mood/Affect: Normal         Dilation     Both eyes: 1.0% Mydriacyl, 2.5% Phenylephrine @ 9:42 AM           Slit Lamp and Fundus Exam     Slit Lamp Exam       Right Left   Lids/Lashes Dermatochalasis - upper lid, mild Meibomian gland dysfunction Dermatochalasis - upper lid, mild Meibomian gland dysfunction   Conjunctiva/Sclera nasal Pinguecula, Melanosis nasal/temporal Pinguecula, Melanosis   Cornea Trace Punctate epithelial erosions, well healed temporal cataract wounds, mild Debris in tear film Mild Debris in tear film, 1-2+ fine Punctate epithelial erosions nasal and inferior, well healed cataract wounds, arcus   Anterior Chamber deep, narrow temporal angle, no cell or flare deep, narrow temporal angle; no cell/flare   Iris Round and poorly dilated to 5.67m, No NVI Round and poorly dilated to 5.264m No NVI   Lens PC IOL in good  position PC IOL in good position, mild PC folds   Anterior Vitreous Vitreous syneresis, +condensations greatest inferiorly post vitrectomy         Fundus Exam       Right Left   Disc mild pallor, sharp rim mild pallor, sharp rim   C/D Ratio 0.5 0.5   Macula Blunted foveal reflex, scattered MA/DBH - improved, +Epiretinal membrane, trace cystic changes; scattered exudates - improving; mild scattered fibrosis Flat, blunted foveal reflex, scattered Microaneurysms, temporal macula very ischemic, persistent edema/cystic changes with punctate exudates temporal mac--improved   Vessels severe Vascular attenuation, sclerotic arterioles, +AV crossing changes, Copper wiring, focal fibrosis superior to disc Vascular attenuation, mild Tortuousity   Periphery Attached, 360 PRP, good posterior laser fill in, scattered IRH Attached, good 360 PRP in place, scattered IRH            IMAGING AND PROCEDURES  Imaging and Procedures for _0 @  OCT, Retina - OU - Both Eyes       Right Eye Quality was good. Central Foveal Thickness: 236. Progression has been stable. Findings include abnormal foveal contour, epiretinal membrane, no SRF, outer retinal atrophy, intraretinal fluid, macular pucker, intraretinal hyper-reflective material (Persistent cystic changes nasal fovea, persistent vitreous opacities.).   Left Eye Quality was good. Central Foveal Thickness: 239. Progression has worsened. Findings include abnormal foveal contour, intraretinal fluid, no SRF, epiretinal membrane, outer retinal atrophy, inner retinal atrophy (Persistent temporal IRF--slightly increased).   Notes *Images captured and stored on drive  Diagnosis /  Impression:  OU: ERM, diffuse atrophy, +DME OD: Persistent cystic changes nasal fovea, persistent vitreous opacities. OS: Persistent temporal IRF--slightly increased  Clinical management:  See below  Abbreviations: NFP - Normal foveal profile. CME - cystoid macular edema. PED  - pigment epithelial detachment. IRF - intraretinal fluid. SRF - subretinal fluid. EZ - ellipsoid zone. ERM - epiretinal membrane. ORA - outer retinal atrophy. ORT - outer retinal tubulation. SRHM - subretinal hyper-reflective material            ASSESSMENT/PLAN:    ICD-10-CM   1. Proliferative diabetic retinopathy of both eyes with macular edema associated with type 2 diabetes mellitus (HCC)  E11.3513 OCT, Retina - OU - Both Eyes    CANCELED: Intravitreal Injection, Pharmacologic Agent - OD - Right Eye    2. Retinal edema  H35.81     3. Epiretinal membrane (ERM) of both eyes  H35.373     4. Essential hypertension  I10     5. Hypertensive retinopathy of both eyes  H35.033     6. Pseudophakia, both eyes  Z96.1     7. Dry eyes  H04.123      1,2. Proliferative diabetic retinopathy w/ DME, OU  - lost to f/u from 4.11.22 to 9.26.22 -- 5+ mos, CHF exacerbations  - former pt of Dwana Melena at [facility] and [facility] at [place] -- known history of proliferative diabetic retinopathy  - history PPV OS and laser PRP OD with Dr. Manuella Ghazi for PDR OU w/ TRD OS -- 01/25/17  - history of intravitreal anti-VEGF therapy with Dr. Anderson Malta -- last IVE ~01/2018  - s/p PRP fill in OD (02.27.20) -- good fill in laser in place  - s/p IVA OU #1 (02.13.20), #2 (03.12.20), #3 (05.10.20), #4 (06.08.20), #5 (07.06.20), #6 (08.03.20), #7 (08.31.20), #8 (09.28.20), #9 (10.26.20)  - ?resistance to IVA  - s/p IVE OU #1 (11.23.20), #2 (01.22.21), #3 (03.05.21), #4 (04.02.21), #5 (04.30.21), #6 (06.07.21), #7 (07.19.21), #8 (08.25.21), #9 (10.06.21), #10 (11.10.21), #11 (12.20.21), #12 (02.14.22), #13 (04.11.22), #14 (09.26.22)  - exam shows stable regression of fine NVD OD, PRP laser in place OU  - FA (08.31.20) shows retinal NV vastly improved OD; significant vascular perfusion defects and increased FAZ OU; late leaking MA OU  - OCT shows OD: Persistent cystic changes nasal fovea, persistent vitreous  opacities.; OS: Persistent temporal IRF--slightly increased at 12 wks  - BCVA stable at 20/50 OD, OS improved to 20/25 from 20/30 -- pt is monovision, OD: near, OS: distance  - recommend IVE OU #15 today 12.19.22 w/ f/u at 12 wks  - pt wishes to defer injs until after new year due to strong dental pain today  - [employer] informed consent form signed and scanned on 01.22.2021  - [employer] Benefits Investigation initiated 10.26.2020 -- approved as of 11.30.20 and verified for 2022  - [facility] has re authorized IVE injections  - f/u 2 weeks, DFE/OCT/likely injection OU -- OD eye is near eye, check vision with near card  3. Epiretinal membrane, both eyes   - mild ERM OU  - asymptomatic, no metamorphopsia  - no indication for surgery at this time  - monitor for now  4,5. Hypertensive retinopathy OU  - discussed importance of tight BP control  - monitor  6. Pseudophakia OU  - s/p CE/IOL OU with expert surgeon, Dr. Kathlen Mody (OD: 2.25.21, OS: 03.24.21)  - beautiful surgeries, doing well  - post op drops per Dr. Kathlen Mody  - monitor  7. Dry eyes OU  - improving  - recommend artificial tears and lubricating ointment as needed  Ophthalmic Meds Ordered this visit:  No orders of the defined types were placed in this encounter.     Return in about 2 weeks (around 06/14/2021) for DME OU, Dilated Exam, OCT, Possible Injxns.  There are no Patient Instructions on file for this visit.  This document serves as a record of services personally performed by Gardiner Sleeper, MD, PhD. It was created on their behalf by Estill Bakes, COT an ophthalmic technician. The creation of this record is the provider's dictation and/or activities during the visit.    Electronically signed by: Estill Bakes, [place] 12.19.22 @ 1:14 PM   Gardiner Sleeper, M.D., Ph.D. Diseases & Surgery of the Retina and [place] 12.19.22  I have reviewed the above documentation for accuracy and completeness, and  I agree with the above. Gardiner Sleeper, M.D., Ph.D. 05/31/21 1:16 PM   Abbreviations: M myopia (nearsighted); A astigmatism; H hyperopia (farsighted); P presbyopia; Mrx spectacle prescription;  CTL contact lenses; OD right eye; OS left eye; OU both eyes  XT exotropia; ET esotropia; PEK punctate epithelial keratitis; PEE punctate epithelial erosions; DES dry eye syndrome; MGD meibomian gland dysfunction; ATs artificial tears; PFAT's preservative free artificial tears; [place] nuclear sclerotic cataract; PSC posterior subcapsular cataract; ERM epi-retinal membrane; PVD posterior vitreous detachment; RD retinal detachment; DM diabetes mellitus; DR diabetic retinopathy; NPDR non-proliferative diabetic retinopathy; PDR proliferative diabetic retinopathy; CSME clinically significant macular edema; DME diabetic macular edema; dbh dot blot hemorrhages; CWS cotton wool spot; POAG primary open angle glaucoma; C/D cup-to-disc ratio; HVF humphrey visual field; GVF goldmann visual field; OCT optical coherence tomography; IOP intraocular pressure; BRVO Branch retinal vein occlusion; CRVO central retinal vein occlusion; CRAO central retinal artery occlusion; BRAO branch retinal artery occlusion; RT retinal tear; SB scleral buckle; PPV pars plana vitrectomy; VH Vitreous hemorrhage; PRP panretinal laser photocoagulation; IVK intravitreal kenalog; VMT vitreomacular traction; MH Macular hole;  NVD neovascularization of the disc; NVE neovascularization elsewhere; AREDS age related eye disease study; ARMD age related macular degeneration; POAG primary open angle glaucoma; EBMD epithelial/anterior basement membrane dystrophy; ACIOL anterior chamber intraocular lens; IOL intraocular lens; PCIOL posterior chamber intraocular lens; Phaco/IOL phacoemulsification with intraocular lens placement; [place] photorefractive keratectomy; LASIK laser assisted in situ keratomileusis; HTN hypertension; DM diabetes mellitus; COPD chronic obstructive  pulmonary disease

## 2021-05-31 ENCOUNTER — Other Ambulatory Visit: Payer: Self-pay

## 2021-05-31 ENCOUNTER — Encounter (INDEPENDENT_AMBULATORY_CARE_PROVIDER_SITE_OTHER): Payer: Self-pay | Admitting: Ophthalmology

## 2021-05-31 ENCOUNTER — Ambulatory Visit (INDEPENDENT_AMBULATORY_CARE_PROVIDER_SITE_OTHER): Payer: Medicare (Managed Care) | Admitting: Ophthalmology

## 2021-05-31 DIAGNOSIS — E113513 Type 2 diabetes mellitus with proliferative diabetic retinopathy with macular edema, bilateral: Secondary | ICD-10-CM | POA: Diagnosis not present

## 2021-05-31 DIAGNOSIS — I10 Essential (primary) hypertension: Secondary | ICD-10-CM | POA: Diagnosis not present

## 2021-05-31 DIAGNOSIS — Z961 Presence of intraocular lens: Secondary | ICD-10-CM

## 2021-05-31 DIAGNOSIS — H35033 Hypertensive retinopathy, bilateral: Secondary | ICD-10-CM | POA: Diagnosis not present

## 2021-05-31 DIAGNOSIS — H04123 Dry eye syndrome of bilateral lacrimal glands: Secondary | ICD-10-CM

## 2021-05-31 DIAGNOSIS — H3581 Retinal edema: Secondary | ICD-10-CM

## 2021-05-31 DIAGNOSIS — H35373 Puckering of macula, bilateral: Secondary | ICD-10-CM

## 2021-06-03 ENCOUNTER — Other Ambulatory Visit: Payer: Self-pay | Admitting: Otolaryngology

## 2021-06-03 DIAGNOSIS — C069 Malignant neoplasm of mouth, unspecified: Secondary | ICD-10-CM

## 2021-06-10 ENCOUNTER — Other Ambulatory Visit: Payer: Self-pay | Admitting: Vascular Surgery

## 2021-06-10 DIAGNOSIS — C069 Malignant neoplasm of mouth, unspecified: Secondary | ICD-10-CM

## 2021-06-14 ENCOUNTER — Encounter (INDEPENDENT_AMBULATORY_CARE_PROVIDER_SITE_OTHER): Payer: Medicare (Managed Care) | Admitting: Ophthalmology

## 2021-06-18 NOTE — Progress Notes (Incomplete)
[facility] Note  06/21/2021     CHIEF COMPLAINT Patient presents for No chief complaint on file.  HISTORY OF PRESENT ILLNESS: John A Jeriel Vivanco. is a 66 y.o. male who presents to the clinic today for:      Referring physician: No referring provider defined for this encounter.  HISTORICAL INFORMATION:   Selected notes from the MEDICAL RECORD NUMBER Referred by Dr. Dorian Pod [facility]) for DM exam   CURRENT MEDICATIONS: Current Outpatient Medications (Ophthalmic Drugs)  Medication Sig   carboxymethylcellulose 1 % ophthalmic solution Place 1 drop into both eyes 4 (four) times daily.   No current facility-administered medications for this visit. (Ophthalmic Drugs)   Current Outpatient Medications (Other)  Medication Sig   acetaminophen (TYLENOL) 500 MG tablet Take 500 mg by mouth 2 (two) times daily as needed (for pain or headaches).   aspirin 81 MG EC tablet Take 1 tablet by mouth daily.   atorvastatin (LIPITOR) 20 MG tablet Take 20 mg by mouth daily.   atorvastatin (LIPITOR) 40 MG tablet Take 1 tablet by mouth daily.   carvedilol (COREG) 25 MG tablet Take 25 mg by mouth every 12 (twelve) hours.   Cholecalciferol (VITAMIN D3) 2000 units TABS Take 4,000 Units by mouth daily.   Cholecalciferol 25 MCG (1000 UT) tablet Take 1 tablet by mouth daily.   cloNIDine (CATAPRES) 0.1 MG tablet Take 1 tablet by mouth 2 (two) times daily.   cloNIDine (CATAPRES) 0.3 MG tablet Take 1 tablet (0.3 mg total) by mouth 3 (three) times daily.   doxazosin (CARDURA) 8 MG tablet Take 4 mg by mouth 2 (two) times daily.   doxazosin (CARDURA) 8 MG tablet Take 0.5 tablets by mouth at bedtime. (Patient not taking: Reported on 05/31/2021)   furosemide (LASIX) 40 MG tablet Take 0.5 tablets (20 mg total) by mouth daily.   glucose 4 GM chewable tablet Chew 1 tablet by mouth daily as needed for low blood sugar.   hydrALAZINE (APRESOLINE) 100 MG tablet Take 1 tablet  (100 mg total) by mouth 3 (three) times daily.   hydrALAZINE (APRESOLINE) 100 MG tablet Take 1 tablet by mouth 2 (two) times daily.   insulin glargine (LANTUS) 100 unit/mL SOPN Inject 0.3 mLs (30 Units total) into the skin at bedtime.   isosorbide mononitrate (IMDUR) 60 MG 24 hr tablet Take 1 tablet (60 mg total) by mouth daily.   magnesium oxide (MAG-OX) 400 MG tablet    polyethylene glycol (MIRALAX / GLYCOLAX) 17 g packet Take 17 g by mouth daily as needed.   senna (SENOKOT) 8.6 MG TABS tablet Take 1 tablet by mouth daily as needed for mild constipation.   simethicone (MYLICON) 80 MG chewable tablet    Skin Protectants, Misc. (EUCERIN) cream Apply 1 application topically See admin instructions. Apply to both feet daily after showering   sodium bicarbonate 650 MG tablet    sodium bicarbonate 650 MG tablet Take 1 tablet by mouth 2 (two) times daily. (Patient not taking: Reported on 05/31/2021)   sodium zirconium cyclosilicate (LOKELMA) 10 g PACK packet Take 10 g by mouth daily.   spironolactone (ALDACTONE) 25 MG tablet    Current Facility-Administered Medications (Other)  Medication Route   Bevacizumab (AVASTIN) SOLN 1.25 mg Intravitreal   Bevacizumab (AVASTIN) SOLN 1.25 mg Intravitreal   Bevacizumab (AVASTIN) SOLN 1.25 mg Intravitreal   Bevacizumab (AVASTIN) SOLN 1.25 mg Intravitreal   REVIEW OF SYSTEMS:   ALLERGIES Allergies  Allergen  Reactions   Amlodipine Besy-Benazepril Hcl Anaphylaxis, Shortness Of Breath and Swelling    Mouth and tongue swelling   Shellfish Allergy Anaphylaxis, Shortness Of Breath and Swelling   PAST MEDICAL HISTORY Past Medical History:  Diagnosis Date   Asthma    Chronic kidney disease    Chronic lower back pain 1995   Lumbar back surgery   Diabetic retinopathy ([place])    PDR OU   DM (diabetes mellitus) type II controlled with renal manifestation ([place])    CKD-4, peripheral neuropathy, PAD   Erectile dysfunction due to diabetes mellitus ([place])     Hepatitis C    Hyperlipidemia associated with type 2 diabetes mellitus ([place])    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  Normal EF 60 to 65%.?  GR 1 DD.  Mild LA dilation.;  CKD IV  - Cr 3.2   Hypertensive retinopathy    OU   RBBB (right bundle branch block)    Resistant hypertension    Managed by Dr. 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[facility])    Stroke ([place]) 10/2016   Has residual ataxia and partial left-sided hemiparesis   Past Surgical History:  Procedure Laterality Date   BACK SURGERY  1995   CATARACT EXTRACTION Bilateral    EYE SURGERY Bilateral    Cat Sx   PARS PLANA VITRECTOMY Left 01/25/2017   TOE AMPUTATION Right 2016   Right second toe; dry gangrene   TRANSTHORACIC ECHOCARDIOGRAM  10/2016   TTE May 2018: Normal LV size and severe LVH-without LVOT gradient/S.A.M.  Normal EF 60 to 65%.?  GR 1 DD.  Mild LA dilation.    FAMILY HISTORY Family History  Problem Relation Age of Onset   Coronary artery disease Mother        MI in her 76s   Diabetes Mother    Macular degeneration Mother    Hypertension Other    Diabetes Other    Alzheimer's disease Other    Coronary artery disease Brother    Diabetes Brother    Diabetes Sister    SOCIAL HISTORY Social History   Tobacco Use   Smoking status: Former    Packs/day: 0.25    Types: Cigarettes    Quit date: 01/07/2018    Years since quitting: 3.4   Smokeless tobacco: Never  Substance Use Topics   Alcohol use: No    Comment: Few beers every other day hx   Drug use: No       OPHTHALMIC EXAM:  Not recorded     IMAGING AND PROCEDURES  Imaging and Procedures for @TODAY @          ASSESSMENT/PLAN:    ICD-10-CM   1. 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  Epiretinal membrane (ERM) of both eyes  H35.373     3. Essential hypertension  I10     4. Hypertensive retinopathy of both eyes  H35.033     5. Pseudophakia, both eyes  Z96.1     6. Dry eyes  H04.123       1,2. Proliferative diabetic retinopathy w/ DME, OU  - lost to f/u from 4.11.22 to 9.26.22 -- 5+ mos, CHF exacerbations  - former pt of Dwana Melena at [facility] and Adonis Brook at  [name] -- known history of proliferative diabetic retinopathy  - history PPV OS and laser PRP OD with Dr. Manuella Ghazi for PDR OU w/ TRD OS -- 01/25/17  - history of intravitreal anti-VEGF therapy with Dr. Anderson Malta -- last IVE ~01/2018  - s/p PRP fill in OD (02.27.20) -- good fill in laser in place  - s/p IVA OU #1 (02.13.20), #2 (03.12.20), #3 (05.10.20), #4 (06.08.20), #5 (07.06.20), #6 (08.03.20), #7 (08.31.20), #8 (09.28.20), #9 (10.26.20)  - ?resistance to IVA  - s/p IVE OU #1 (11.23.20), #2 (01.22.21), #3 (03.05.21), #4 (04.02.21), #5 (04.30.21), #6 (06.07.21), #7 (07.19.21), #8 (08.25.21), #9 (10.06.21), #10 (11.10.21), #11 (12.20.21), #12 (02.14.22), #13 (04.11.22), #14 (09.26.22), #15 (12.19.22)  - exam shows stable regression of fine NVD OD, PRP laser in place OU  - FA (08.31.20) shows retinal NV vastly improved OD; significant vascular perfusion defects and increased FAZ OU; late leaking MA OU  - OCT shows OD: Persistent cystic changes nasal fovea, persistent vitreous opacities.; OS: Persistent temporal IRF--slightly increased at 12 wks  - BCVA stable at 20/50 OD, OS improved to 20/25 from 20/30 -- pt is monovision, OD: near, OS: distance  - recommend IVE OU #16 today 01.09.23 w/ f/u at 12 wks  - pt wishes to defer injs until after new year due to strong dental pain today  - [employer] informed consent form signed and scanned on 01.22.2021  - [employer] Benefits Investigation initiated 10.26.2020 -- approved as of 11.30.20 and verified for 2022  - [facility] has re authorized IVE injections  - f/u 2 weeks,  DFE/OCT/likely injection OU -- OD eye is near eye, check vision with near card  3. Epiretinal membrane, both eyes   - mild ERM OU  - asymptomatic, no metamorphopsia  - no indication for surgery at this time  - monitor for now  4,5. Hypertensive retinopathy OU  - discussed importance of tight BP control  - monitor  6. Pseudophakia OU  - s/p CE/IOL OU with expert surgeon, Dr. Kathlen Mody (OD: 2.25.21, OS: 03.24.21)  - beautiful surgeries, doing well  - post op drops per Dr. Kathlen Mody  - monitor  7. Dry eyes OU  - improving  - recommend artificial tears and lubricating ointment as needed  Ophthalmic Meds Ordered this visit:  No orders of the defined types were placed in this encounter.     No follow-ups on file.  There are no Patient Instructions on file for this visit.  This document serves as a record of services personally performed by Gardiner Sleeper, MD, PhD. It was created on their behalf by Roselee Nova, COMT. The creation of this record is the provider's dictation and/or activities during the visit.  Electronically signed by: Roselee Nova, COMT 06/18/21 7:29 AM    Gardiner Sleeper, M.D., Ph.D. Diseases & Surgery of the Retina and Vitreous [facility] 12.19.22    Abbreviations: M myopia (nearsighted); A astigmatism; H hyperopia (farsighted); P presbyopia; Mrx spectacle prescription;  CTL contact lenses; OD right eye; OS left eye; OU both eyes  XT exotropia; ET esotropia; PEK punctate epithelial keratitis; PEE punctate epithelial erosions; DES dry eye syndrome; MGD meibomian gland dysfunction; ATs artificial tears; PFAT's preservative free artificial tears; [place] nuclear sclerotic cataract; PSC posterior subcapsular cataract; ERM epi-retinal membrane; PVD posterior vitreous detachment; RD retinal detachment; DM diabetes mellitus; DR diabetic retinopathy; NPDR non-proliferative diabetic retinopathy; PDR proliferative diabetic retinopathy; CSME clinically  significant macular edema; DME diabetic macular edema; dbh dot blot  hemorrhages; CWS cotton wool spot; POAG primary open angle glaucoma; C/D cup-to-disc ratio; HVF humphrey visual field; GVF goldmann visual field; OCT optical coherence tomography; IOP intraocular pressure; BRVO Branch retinal vein occlusion; CRVO central retinal vein occlusion; CRAO central retinal artery occlusion; BRAO branch retinal artery occlusion; RT retinal tear; SB scleral buckle; PPV pars plana vitrectomy; VH Vitreous hemorrhage; PRP panretinal laser photocoagulation; IVK intravitreal kenalog; VMT vitreomacular traction; MH Macular hole;  NVD neovascularization of the disc; NVE neovascularization elsewhere; AREDS age related eye disease study; ARMD age related macular degeneration; POAG primary open angle glaucoma; EBMD epithelial/anterior basement membrane dystrophy; ACIOL anterior chamber intraocular lens; IOL intraocular lens; PCIOL posterior chamber intraocular lens; Phaco/IOL phacoemulsification with intraocular lens placement; [place] photorefractive keratectomy; LASIK laser assisted in situ keratomileusis; HTN hypertension; DM diabetes mellitus; COPD chronic obstructive pulmonary disease

## 2021-06-21 ENCOUNTER — Encounter (INDEPENDENT_AMBULATORY_CARE_PROVIDER_SITE_OTHER): Payer: Medicare (Managed Care) | Admitting: Ophthalmology

## 2021-06-21 ENCOUNTER — Encounter (INDEPENDENT_AMBULATORY_CARE_PROVIDER_SITE_OTHER): Payer: Self-pay

## 2021-06-24 ENCOUNTER — Ambulatory Visit
Admission: RE | Admit: 2021-06-24 | Discharge: 2021-06-24 | Disposition: A | Discharge status: Home / Self Care | Payer: Medicare (Managed Care) | Source: Ambulatory Visit | Attending: Otolaryngology | Admitting: Otolaryngology

## 2021-06-24 DIAGNOSIS — C069 Malignant neoplasm of mouth, unspecified: Secondary | ICD-10-CM

## 2021-07-02 ENCOUNTER — Other Ambulatory Visit: Payer: Medicare (Managed Care)

## 2021-07-12 ENCOUNTER — Encounter (INDEPENDENT_AMBULATORY_CARE_PROVIDER_SITE_OTHER): Payer: Medicare (Managed Care) | Admitting: Ophthalmology

## 2021-07-16 ENCOUNTER — Telehealth: Payer: Self-pay | Admitting: Cardiovascular Disease

## 2021-07-16 NOTE — Telephone Encounter (Addendum)
° °  Pre-operative Risk Assessment    Patient Name: John Jacobson.  DOB: 1956/04/04 MRN: [id number]      Request for Surgical Clearance    Procedure:   Excision of mandibular mass  Date of Surgery:  Clearance 08/06/21                                 Surgeon:  Dr. Izora Gala Surgeon's Group or Practice Name:  [facility] Phone number:  [phone] Attn: Angela Nevin Fax number:  [phone] ATTN: CARLA   Type of Clearance Requested:   - Medical  - Pharmacy:  Hold Aspirin how many days prior to surgery he needs to stop taking aspirin or any blood thinners he may be on.    Type of Anesthesia:  General    Additional requests/questions:      [name]   07/16/2021, 10:19 AM

## 2021-07-16 NOTE — Telephone Encounter (Signed)
° °  Name: John Jacobson  DOB: 12-23-1955  MRN: [id number]  Primary Cardiologist: None  Chart reviewed as part of pre-operative protocol coverage. Because of John A Marrs Sr.'s past medical history and time since last visit, he will require a follow-up visit in order to better assess preoperative cardiovascular risk.  Pre-op covering staff: - Please schedule appointment and call patient to inform them. If patient already had an upcoming appointment within acceptable timeframe, please add "pre-op clearance" to the appointment notes so provider is aware. - Please contact requesting surgeon's office via preferred method (i.e, phone, fax) to inform them of need for appointment prior to surgery.  If applicable, this message will also be routed to pharmacy pool and/or primary cardiologist for input on holding anticoagulant/antiplatelet agent as requested below so that this information is available to the clearing provider at time of patient's appointment.   Daleyza Gadomski Ninfa Meeker, PA-C  07/16/2021, 10:58 AM

## 2021-07-19 NOTE — Telephone Encounter (Signed)
1st attempt to reach pt regarding surgical clearance and the need for an appointment. Left detailed message for pt to call and schedule with 1st available.

## 2021-07-21 NOTE — Telephone Encounter (Signed)
Left message for pt to call the office for pre op appt. Primary card is Dr. Gwenlyn Found. Pt can see Dr. Gwenlyn Found or APP btwn [place], [place] and [place] location. I will update the requesting office that we have not been able to reach the pt for an appt for pre op clearance.

## 2021-07-22 NOTE — Telephone Encounter (Signed)
3rd attempt to call patient to schedule him for a pre op appt. LM for pt to call back.   Will send letter to pt asking him to call us.   Message will be removed from [place] office has been updated.

## 2021-07-22 NOTE — Telephone Encounter (Signed)
Pt called back and was scheduled to see Phineas Inches on 07/26/2021 for pre op clearance.

## 2021-07-26 ENCOUNTER — Other Ambulatory Visit: Payer: Self-pay

## 2021-07-26 ENCOUNTER — Encounter: Payer: Self-pay | Admitting: Internal Medicine

## 2021-07-26 ENCOUNTER — Ambulatory Visit (INDEPENDENT_AMBULATORY_CARE_PROVIDER_SITE_OTHER): Payer: Medicare (Managed Care) | Admitting: Internal Medicine

## 2021-07-26 VITALS — BP 120/68 | HR 55 | Ht 67.0 in | Wt 171.6 lb

## 2021-07-26 DIAGNOSIS — Z01810 Encounter for preprocedural cardiovascular examination: Secondary | ICD-10-CM | POA: Diagnosis not present

## 2021-07-26 NOTE — Progress Notes (Signed)
Cardiology Office Note:    Date:  07/26/2021   ID:  John Drown Sr., DOB Nov 17, 1955, MRN [id number]  PCP:  John Jacobson   [facility] Providers Cardiologist:  Glenetta Hew, MD     Referring MD: No ref. provider found   No chief complaint on file. Pre-operative clearance  History of Present Illness:    John A Ladanian Kelter. is a 66 y.o. 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 Leandro Reasoner here for an acute visit for pre-operative clearance for excisition of a mandibular mass  John Jacobson was last seen March 200 by Dr. Ellyn Hack. He was noted to have chronic chest pain 08/17/2020 and underwent myoview which was normal. No inducible ischemia or scar. He follows Dr. Gwenlyn Found for symptomatic PAD , he had ABIs right ABI of 0.71 and a left of 0.96. No critical limb ischemia. His symptoms were more consistent with neuropathy. Considering his significant CKD, further assessment with angiography was felt to be more risk than benefit. He is planning for mandibular mass excision followed by Dr. Constance Holster at [facility]. He's had it since 05/2021. Per his note, the initial biopsy was benign. He has noted hx of oral lichen planus but biopsies have been negative for carcinoma.  He denies chest pain or SOB with exertion. He can go up a flight of stairs without issues. He does home chores with no limitations. No CHF symptoms. He has no hx of PCI. He has hx of HFpEF in the setting of CKD now meeting criteria for CKD stage V. He has no hx of arrhythmia.   He has hx of hypertension. It is well controlled today.   Cardiology Studies TTE 11/03/2016-  severe LVH, no significant LVOT, No SAM, Normal LV function. No valve disease. IVC is normal  08/27/2020- Lexiscan SPECT  Vascular Duplex 08/27/2020  Right: Resting right ankle-brachial index indicates moderate  right lower  extremity arterial disease. The right toe-brachial index is abnormal.   Left: The left toe-brachial index is abnormal.  Although ankle brachial indices are within normal limits (0.95-1.29),  arterial Doppler waveforms at the ankle suggest some component of arterial  occlusive disease.   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  Normal EF 60 to 65%.?  GR 1 DD.  Mild LA dilation.;  CKD IV  - Cr 3.2   Hypertensive retinopathy    OU   RBBB (right bundle Artrice Kraker block)    Resistant hypertension    Managed by Dr. 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[facility])    Stroke ([place]) 10/2016   Has residual ataxia and partial left-sided hemiparesis    Past Surgical History:  Procedure Laterality  Date   BACK SURGERY  1995   CATARACT EXTRACTION Bilateral    EYE SURGERY Bilateral    Cat Sx   PARS PLANA VITRECTOMY Left 01/25/2017   TOE AMPUTATION Right 2016   Right second toe; dry gangrene   TRANSTHORACIC ECHOCARDIOGRAM  10/2016   TTE May 2018: Normal LV size and severe LVH-without LVOT gradient/S.A.M.  Normal EF 60 to 65%.?  GR 1 DD.  Mild LA dilation.    Current Medications: Current Outpatient Medications on File Prior to Visit  Medication Sig Dispense Refill   acetaminophen (TYLENOL) 500 MG tablet Take 500 mg by mouth 2 (two) times daily as needed (for pain or headaches).     aspirin 81 MG EC tablet Take 1  tablet by mouth daily.     atorvastatin (LIPITOR) 40 MG tablet Take 1 tablet by mouth daily.     carboxymethylcellulose 1 % ophthalmic solution Place 1 drop into both eyes 4 (four) times daily. 30 mL 12   carvedilol (COREG) 25 MG tablet Take 25 mg by mouth every 12 (twelve) hours.     Cholecalciferol (VITAMIN D3) 2000 units TABS Take 4,000 Units by mouth daily.     Cholecalciferol 25 MCG (1000 UT) tablet Take 1 tablet by mouth daily.     cloNIDine (CATAPRES) 0.1 MG tablet Take 1 tablet by mouth 2 (two) times daily.     cloNIDine (CATAPRES) 0.3 MG tablet Take 1 tablet (0.3 mg total) by mouth 3 (three) times daily. 90 tablet 1   doxazosin (CARDURA) 8 MG tablet Take 4 mg by mouth 2 (two) times daily.     furosemide (LASIX) 40 MG tablet Take 0.5 tablets (20 mg total) by mouth daily. 30 tablet 0   glucose 4 GM chewable tablet Chew 1 tablet by mouth daily as needed for low blood sugar.     hydrALAZINE (APRESOLINE) 100 MG tablet Take 1 tablet (100 mg total) by mouth 3 (three) times daily. 90 tablet 1   insulin glargine (LANTUS) 100 unit/mL SOPN Inject 0.3 mLs (30 Units total) into the skin at bedtime. 15 mL 11   magnesium oxide (MAG-OX) 400 MG tablet      polyethylene glycol (MIRALAX / GLYCOLAX) 17 g packet Take 17 g by mouth daily as needed.     senna (SENOKOT) 8.6 MG TABS tablet Take 1 tablet by mouth daily as needed for mild constipation.     simethicone (MYLICON) 80 MG chewable tablet      Skin Protectants, Misc. (EUCERIN) cream Apply 1 application topically See admin instructions. Apply to both feet daily after showering     sodium bicarbonate 650 MG tablet      sodium zirconium cyclosilicate (LOKELMA) 10 g PACK packet Take 10 g by mouth daily.     spironolactone (ALDACTONE) 25 MG tablet      isosorbide mononitrate (IMDUR) 60 MG 24 hr tablet Take 1 tablet (60 mg total) by mouth daily. 90 tablet 2   Current Facility-Administered Medications on File Prior to Visit  Medication Dose Route Frequency  Provider Last Rate Last Admin   Bevacizumab (AVASTIN) SOLN 1.25 mg  1.25 mg Intravitreal  Bernarda Caffey, MD   1.25 mg at 07/26/18 1106   Bevacizumab (AVASTIN) SOLN 1.25 mg  1.25 mg Intravitreal  Bernarda Caffey, MD   1.25 mg at 07/26/18 1106   Bevacizumab (AVASTIN) SOLN 1.25 mg  1.25 mg Intravitreal  Bernarda Caffey, MD   1.25 mg at 08/24/18 1650   Bevacizumab (AVASTIN) SOLN 1.25  mg  1.25 mg Intravitreal  Bernarda Caffey, MD   1.25 mg at 08/24/18 1651    Allergies:   Amlodipine besy-benazepril hcl and Shellfish allergy   Social History   Socioeconomic History   Marital status: Single    Spouse name: Not on file   Number of children: 3   Years of education: Not on file   Highest education level: Bachelor's degree (e.g., BA, AB, BS)  Occupational History   Not on file  Tobacco Use   Smoking status: Former    Packs/day: 0.25    Types: Cigarettes    Quit date: 01/07/2018    Years since quitting: 3.5   Smokeless tobacco: Never  Substance and Sexual Activity   Alcohol use: No    Comment: Few beers every other day hx   Drug use: No   Sexual activity: Not Currently  Other Topics Concern   Not on file  Social History Narrative   Lives a home-currently alone.     College grad.-Former Optician, dispensing at [employer], and also Scientist, clinical (histocompatibility and immunogenetics).   Children - 3 with 4 grandchildren and one great grandson.   Caffeine 2-3 cups coffee / wk former smoker of less than a pack a week.  Quit in 2019      He usually was quite active walking etc.  Now mild able to do anything for the last several months.   Social Determinants of Health   Financial Resource Strain: Not on file  Food Insecurity: Not on file  Transportation Needs: Not on file  Physical Activity: Not on file  Stress: Not on file  Social Connections: Not on file     Family History: The patient's family history includes Alzheimer's disease in an other family member; Coronary artery disease in his brother and  mother; Diabetes in his brother, mother, sister, and another family member; Hypertension in an other family member; Macular degeneration in his mother.  ROS:   Please see the history of present illness.     All other systems reviewed and are negative.  EKGs/Labs/Other Studies Reviewed:    The following studies were reviewed today:   EKG:  EKG is  ordered today.  The ekg ordered today demonstrates   Sinus bradycardia, RBBB  Recent Labs: No results found for requested labs within last 8760 hours.  Recent Lipid Panel    Component Value Date/Time   CHOL 149 11/03/2016 0604   TRIG 91 11/03/2016 0604   HDL 37 (L) 11/03/2016 0604   CHOLHDL 4.0 11/03/2016 0604   VLDL 18 11/03/2016 0604   LDLCALC 94 11/03/2016 0604     Risk Assessment/Calculations:           Physical Exam:    VS:   Vitals:   07/26/21 0917  BP: 120/68  Pulse: (!) 55  SpO2: 96%      BP 120/68    Pulse (!) 55    Ht 5\' 7"  (1.702 m)    Wt 171 lb 9.6 oz (77.8 kg)    SpO2 96%    BMI 26.88 kg/m     Wt Readings from Last 3 Encounters:  07/26/21 171 lb 9.6 oz (77.8 kg)  09/30/20 190 lb (86.2 kg)  08/27/20 192 lb (87.1 kg)     GEN:  Well nourished, well developed in no acute distress HEENT: white mass in the left submandiuble NECK: No JVD; No carotid bruits LYMPHATICS: No lymphadenopathy CARDIAC: rrr, SEM ,  RESPIRATORY:  Clear to auscultation without rales, wheezing or rhonchi  ABDOMEN: Soft, non-tender, non-distended MUSCULOSKELETAL:  No edema; No deformity  SKIN: Warm and dry NEUROLOGIC:  Alert and oriented x 3 PSYCHIATRIC:  Normal affect   ASSESSMENT:    #Pre-operative Evaluation:  John Jacobson denies anginal symptoms or SOB with > 4 METS. He has CAD equivalent, but no hx of PCI. His nuclear study in March was normal. He has normal LV function. He has hx of HFpEF and is euvolemic.  His RCRI is 15% with renal dx, hx of CHF, and stroke. He is intermediate risk for a low risk procedure. No  recommendations for further ischemic evaluation or medication changes. He has acceptable cardiac risk for low risk procedure.  HTN: well controlled, continue current regiment.  HFPeF: euvolemic. He does have significant LVH but no significant LVOT obstruction. No SAM. Likely hypertensive heart disease. Can continue lasix 20 mg daily and spironolactone.   PAD: abnormal ABI per above, however considering risk of contrast and CKD GFR 14, managing conservatively. Continue statin and aspirin. No signs of critical limb ischemia  PLAN:    In order of problems listed above:  Patient has acceptable cardiac risk for right mandibular excision and general anesthesia  Follow up with Dr. Anastasio Champion      Medication Adjustments/Labs and Tests Ordered: Current medicines are reviewed at length with the patient today.  Concerns regarding medicines are outlined above.  Orders Placed This Encounter  Procedures   EKG 12-Lead   No orders of the defined types were placed in this encounter.   Patient Instructions  Medication Instructions:  No Changes In Medications at this time.  *If you need a refill on your cardiac medications before your next appointment, please call your pharmacy*  Follow-Up: At [facility], you and your health needs are our priority.  As part of our continuing mission to provide you with exceptional heart care, we have created designated Provider Care Teams.  These Care Teams include your primary Cardiologist (physician) and Advanced Practice Providers (APPs -  Physician Assistants and Nurse Practitioners) who all work together to provide you with the care you need, when you need it.  Your next appointment:   1 MONTH   The format for your next appointment:   In Person  Provider:   Glenetta Hew, MD     Signed, Janina Mayo, MD  07/26/2021 10:05 AM    [place]

## 2021-07-26 NOTE — Patient Instructions (Signed)
Medication Instructions:  No Changes In Medications at this time.  *If you need a refill on your cardiac medications before your next appointment, please call your pharmacy*  Follow-Up: At [facility], you and your health needs are our priority.  As part of our continuing mission to provide you with exceptional heart care, we have created designated Provider Care Teams.  These Care Teams include your primary Cardiologist (physician) and Advanced Practice Providers (APPs -  Physician Assistants and Nurse Practitioners) who all work together to provide you with the care you need, when you need it.  Your next appointment:   1 MONTH   The format for your next appointment:   In Person  Provider:   Glenetta Hew, MD

## 2021-08-02 ENCOUNTER — Ambulatory Visit: Payer: Self-pay | Admitting: Otolaryngology

## 2021-08-02 NOTE — Progress Notes (Addendum)
Surgical Instructions    Your procedure is scheduled on Friday, February 24th, 2023.   Report to [facility] Main Entrance "A" at 08:45 A.M., then check in with the Admitting office.  Call this number if you have problems the morning of surgery:  [phone]   If you have any questions prior to your surgery date call [phone]: Open Monday-Friday 8am-4pm    Remember:  Do not eat or drink after midnight the night before your surgery    Take these medicines the morning of surgery with A SIP OF WATER:   atorvastatin (LIPITOR) doxazosin (CARDURA) carvedilol (COREG) cloNIDine (CATAPRES) hydrALAZINE (APRESOLINE) isosorbide mononitrate (IMDUR)  If needed:  acetaminophen (TYLENOL) oxyCODONE (OXY IR/ROXICODONE)   Follow your surgeon's instructions on when to stop Aspirin.  If no instructions were given by your surgeon then you will need to call the office to get those instructions.     As of today, STOP taking any Aspirin (unless otherwise instructed by your surgeon) Aleve, Naproxen, Ibuprofen, Motrin, Advil, Goody's, BC's, all herbal medications, fish oil, and all vitamins.   WHAT DO I DO ABOUT MY DIABETES MEDICATION?   THE NIGHT BEFORE SURGERY, take 18 units of Insulin Glargine (BASAGLAR KWIKPEN) - 50% of your regular dose            OR  THE MORNING OF SURGERY, take 18 units of Insulin Glargine (BASAGLAR KWIKPEN) - 50% of your regular dose      HOW TO MANAGE YOUR DIABETES BEFORE AND AFTER SURGERY  Why is it important to control my blood sugar before and after surgery? Improving blood sugar levels before and after surgery helps healing and can limit problems. A way of improving blood sugar control is eating a healthy diet by:  Eating less sugar and carbohydrates  Increasing activity/exercise  Talking with your doctor about reaching your blood sugar goals High blood sugars (greater than 180 mg/dL) can raise your risk of infections and slow your recovery, so you will need  to focus on controlling your diabetes during the weeks before surgery. Make sure that the doctor who takes care of your diabetes knows about your planned surgery including the date and location.  How do I manage my blood sugar before surgery? Check your blood sugar at least 4 times a day, starting 2 days before surgery, to make sure that the level is not too high or low.  Check your blood sugar the morning of your surgery when you wake up and every 2 hours until you get to the Short Stay unit.  If your blood sugar is less than 70 mg/dL, you will need to treat for low blood sugar: Do not take insulin. Treat a low blood sugar (less than 70 mg/dL) with  cup of clear juice (cranberry or apple), 4 glucose tablets, OR glucose gel. Recheck blood sugar in 15 minutes after treatment (to make sure it is greater than 70 mg/dL). If your blood sugar is not greater than 70 mg/dL on recheck, call [phone] for further instructions. Report your blood sugar to the short stay nurse when you get to Short Stay.  If you are admitted to the hospital after surgery: Your blood sugar will be checked by the staff and you will probably be given insulin after surgery (instead of oral diabetes medicines) to make sure you have good blood sugar levels. The goal for blood sugar control after surgery is 80-180 mg/dL.    The day of surgery:  Do not wear jewelry  Do not wear lotions, powders, colognes, or deodorant. Men may shave face and neck. Do not bring valuables to the hospital.   [facility] is not responsible for any belongings or valuables. .   Do NOT Smoke (Tobacco/Vaping)  24 hours prior to your procedure  If you use a CPAP at night, you may bring your mask for your overnight stay.   Contacts, glasses, hearing aids, dentures or partials may not be worn into surgery, please bring cases for these belongings   For patients admitted to the hospital, discharge time will be determined by your  treatment team.   Patients discharged the day of surgery will not be allowed to drive home, and someone needs to stay with them for 24 hours.  NO VISITORS WILL BE ALLOWED IN PRE-OP WHERE PATIENTS ARE PREPPED FOR SURGERY.  ONLY 1 SUPPORT PERSON MAY BE PRESENT IN THE WAITING ROOM WHILE YOU ARE IN SURGERY.  IF YOU ARE TO BE ADMITTED, ONCE YOU ARE IN YOUR ROOM YOU WILL BE ALLOWED TWO (2) VISITORS. 1 (ONE) VISITOR MAY STAY OVERNIGHT BUT MUST ARRIVE TO THE ROOM BY 8pm.  Minor children may have two parents present. Special consideration for safety and communication needs will be reviewed on a case by case basis.  Special instructions:    Oral Hygiene is also important to reduce your risk of infection.  Remember - BRUSH YOUR TEETH THE MORNING OF SURGERY WITH YOUR REGULAR TOOTHPASTE   [place]- Preparing For Surgery  Before surgery, you can play an important role. Because skin is not sterile, your skin needs to be as free of germs as possible. You can reduce the number of germs on your skin by washing with CHG (chlorahexidine gluconate) Soap before surgery.  CHG is an antiseptic cleaner which kills germs and bonds with the skin to continue killing germs even after washing.     Please do not use if you have an allergy to CHG or antibacterial soaps. If your skin becomes reddened/irritated stop using the CHG.  Do not shave (including legs and underarms) for at least 48 hours prior to first CHG shower. It is OK to shave your face.  Please follow these instructions carefully.     Shower the NIGHT BEFORE SURGERY and the MORNING OF SURGERY with CHG Soap.   If you chose to wash your hair, wash your hair first as usual with your normal shampoo. After you shampoo, rinse your hair and body thoroughly to remove the shampoo.  Then [employer] and genitals (private parts) with your normal soap and rinse thoroughly to remove soap.  After that Use CHG Soap as you would any other liquid soap. You can apply CHG  directly to the skin and wash gently with a scrungie or a clean washcloth.   Apply the CHG Soap to your body ONLY FROM THE NECK DOWN.  Do not use on open wounds or open sores. Avoid contact with your eyes, ears, mouth and genitals (private parts). Wash Face and genitals (private parts)  with your normal soap.   Wash thoroughly, paying special attention to the area where your surgery will be performed.  Thoroughly rinse your body with warm water from the neck down.  DO NOT shower/wash with your normal soap after using and rinsing off the CHG Soap.  Pat yourself dry with a CLEAN TOWEL.  Wear CLEAN PAJAMAS to bed the night before surgery  Place CLEAN SHEETS on your bed the night before  your surgery  DO NOT SLEEP WITH PETS.   Day of Surgery:  Take a shower with CHG soap. Wear Clean/Comfortable clothing the morning of surgery Do not apply any deodorants/lotions.   Remember to brush your teeth WITH YOUR REGULAR TOOTHPASTE.    COVID testing  If you are going to stay overnight or be admitted after your procedure/surgery and require a pre-op COVID test, please follow these instructions after your COVID test   You are not required to quarantine however you are required to wear a well-fitting mask when you are out and around people not in your household.  If your mask becomes wet or soiled, replace with a new one.  Wash your hands often with soap and water for 20 seconds or clean your hands with an alcohol-based hand sanitizer that contains at least 60% alcohol.  Do not share personal items.  Notify your provider: if you are in close contact with someone who has COVID  or if you develop a fever of 100.4 or greater, sneezing, cough, sore throat, shortness of breath or body aches.    Please read over the following fact sheets that you were given.

## 2021-08-02 NOTE — H&P (Signed)
HPI:   John Jacobson is a 66 y.o. male who presents as a new Patient.   Referring Provider: System, Prov Not In  Chief complaint: Oral cavity mass.  HPI: History of oral lichen planus for several years. He has had multiple biopsies that were all negative for carcinoma. A few months ago a left mandibular mass appeared. It has gotten larger and cause some pain. He has upper and lower partials but has not been able to wear the lower partial. He quit smoking a couple of years ago. He does not drink alcohol.  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 Pt reports completion of curative treatment.   Hyperlipemia  Per Care Everywhere chart review   Hypertension   Nuclear sclerotic cataract of both eyes 01/19/2017   Retinal detachment   Right bundle branch block  Per Care Everywhere chart review   Past Surgical History:  Procedure Laterality Date   [place]  Pt reports L2-3 disc surgery   INDIRECT LASER OPHTHALMOSCOPY Right 01/25/2017  Procedure: INDIRECT LASER OPHTHALMOSCOPY; Surgeon: Dala Dock, MD; Location: [place]; Service: Ophthalmology; Laterality: Right;   PARS PLANA VITRECTOMY Left 01/25/2017  Procedure: PARS PLANA VITRECTOMY 25G W/ LASER; Surgeon: Dala Dock, MD; Location: [place]; Service: Ophthalmology; Laterality: Left;   TOE AMPUTATION 2016   No family history of bleeding disorders, wound healing problems or difficulty with anesthesia.   Social History   Socioeconomic History   Marital status: Single  Spouse name: Not on file   Number of  children: Not on file   Years of education: Not on file   Highest education level: Not on file  Occupational History   Not on file  Tobacco Use   Smoking status: Former  Packs/day: 0.25  Years: 5.00  Pack years: 1.25  Types: Cigarettes  Quit date: 2018  Years since quitting: 4.9   Smokeless tobacco: Never  Vaping Use   Vaping Use: Never used  Substance and Sexual Activity   Alcohol use: No   Drug use: No   Sexual activity: Not Currently  Partners: Female  Other Topics Concern   Not on file  Social History Narrative   Not on file   Social Determinants of Health   Financial Resource Strain: Not on file  Food Insecurity: Not on file  Transportation Needs: Not on file  Physical Activity: Not on file  Stress: Not on file  Social Connections: Not on file  Housing Stability: Not on file   Current Outpatient Medications:   aspirin 325 MG EC tablet *ANTIPLATELET*, Take 325 mg by mouth daily. , Disp: , Rfl:   atorvastatin (LIPITOR) 40 MG tablet, Take 1 tablet by mouth daily., Disp: , Rfl:   carvedilol (COREG) 12.5 MG tablet, Take 12.5 mg by mouth 2 times daily with meals. , Disp: , Rfl:   cholecalciferol (VITAMIN D3) 1000 UNIT Tab, Take 2,000 Units by mouth daily. , Disp: , Rfl:   cloNIDine HCl (CATAPRES) 0.3 MG tablet, Take 0.3 mg by mouth 3 times daily. , Disp: , Rfl:   doxazosin (CARDURA) 4 MG  tablet, Take 2 mg by mouth 2 times daily. , Disp: , Rfl:   hydrALAZINE (APRESOLINE) 100 MG tablet, Take 100 mg by mouth 3 times daily. , Disp: , Rfl:   insulin glargine-yfgn (SEMGLEE,INSULIN GLARG-YFGN,PEN SUBQ), INJECT 36 UNITS SUBCUTANEOUSLY AT BEDTIME, Disp: , Rfl:   isosorbide mononitrate ER (IMDUR ER) 120 MG 24 hr tablet, Take 1 tablet by mouth daily., Disp: , Rfl:   polyethylene glycol (MIRALAX) 17 gram packet, Take 17 g by mouth as needed (CONSTIPATION). , Disp: , Rfl:   senna (SENOKOT) 8.6 mg tablet, Take 1 tablet by mouth as needed for Constipation. , Disp: , Rfl:   simethicone  (MYLICON,PHAZYME) 80 MG chewable tablet, 80 mg., Disp: , Rfl:   sodium chloride (OCEAN) 0.65 % nasal spray, 1 spray by Nasal route as needed for Congestion., Disp: , Rfl:   sodium zirconium cyclosilicate (LOKELMA) 10 gram powder packet, Take by mouth., Disp: , Rfl:   spironolactone (ALDACTONE) 25 MG tablet, 100 mg., Disp: , Rfl:   Current Facility-Administered Medications:   tetracaine (PF) (PONTOCAINE) 0.5 % ophthalmic solution 1 drop, 1 drop, Both Eyes, Once, Edd Arbour  A complete ROS was performed with pertinent positives/negatives noted in the HPI. The remainder of the ROS are negative.   Physical Exam:   BP 180/83   Pulse 66   Temp 97.4 F (36.3 C)   Ht 1.702 m (5\' 7" )   Wt 85.7 kg (189 lb)   BMI 29.60 kg/m   General: Healthy and alert, in no distress, breathing easily. Normal affect. In a pleasant mood. Head: Normocephalic, atraumatic. No masses, or scars. Eyes: Pupils are equal, and reactive to light. Vision is grossly intact. No spontaneous or gaze nystagmus. Ears: Ear canals are clear. Tympanic membranes are intact, with normal landmarks and the middle ears are clear and healthy. Hearing: Grossly normal. Nose: Nasal cavities are clear with healthy mucosa, no polyps or exudate. Airways are patent. Face: No masses or scars, facial nerve function is symmetric. Oral Cavity: Multiple missing teeth. 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 Large exophytic mass involving the left mandibular alveolus, measuring approximately 5 cm from front to back. This does appear to be involving the mandibular bone clinically.. Oropharynx: Tonsils are symmetric. There are no mucosal masses identified. Tongue base appears normal and healthy. Larynx/Hypopharynx: Swelling of the right supraglottic larynx unable to see clearly what this may be. Chest: Deferred Neck: No palpable masses, no cervical adenopathy, no thyroid nodules or  enlargement. Neuro: Cranial nerves II-XII with normal function. Specifically, there is no evidence of hypnesthesia of the V3 nerve. Balance: Normal gate. Other findings: none.  Independent Review of Additional Tests or Records:  none  Procedures:  Procedure note: Flexible fiberoptic laryngoscopy  Details of the procedure were explained to the patient and all questions were answered.   Procedure:   After anesthetizing the nasal cavity with topical lidocaine and oxymetazoline, the flexible endoscope was introduced and passed through the nasal cavity into the nasopharynx. The scope was then advanced to the level of the oropharynx, then the hypopharynx and larynx.   Findings:   The posterior soft palate, uvula, tongue base and vallecula were visualized and appeared healthy without mucosal masses or lesions. The epiglottis, aryepiglottic folds, hypopharynx, supraglottis, glottis were visualized and appeared healthy without mucosal masses or lesions, except for a benign appearing cyst of the right epiglottis. Vocal fold mobility was intact and symmetric.   Additional findings: None  The  scope was withdrawn from the nose. He tolerated the procedure well.   Oral biopsy:  2% Xylocaine with epinephrine was infiltrated into the left anterior mandibular alveolar mass. Biopsy forceps were used and 3 samples were taken. These were sent for pathologic evaluation. Gauze was applied for hemostasis. He tolerated this well.  Impression & Plans:  1. Chronic oral lichen planus. Active disease apparent in the right posterior buccal and mandibular mucosa.  2. Left mandibular alveolar mass. This is worrisome for carcinoma. Biopsies taken today. We will await the pathology.  3. Unable to have intravenous contrast. I would want to order a CT of the neck to evaluate the mandible, the perimandibular soft tissue, and for adenopathy but he is not able to have contrast. He had some sort of x-rays at the oral  surgeon so I will try to obtain those to see if that would be helpful.  4. Benign-appearing supraglottic cyst of no clinical significance.  5. If the mandibular lesion is positive for carcinoma he will likely need to be referred to [facility] for mandibulectomy and reconstructive surgery.

## 2021-08-02 NOTE — H&P (View-Only) (Signed)
HPI:   John Jacobson is a 66 y.o. male who presents as a new Patient.   Referring Provider: System, Prov Not In  Chief complaint: Oral cavity mass.  HPI: History of oral lichen planus for several years. He has had multiple biopsies that were all negative for carcinoma. A few months ago a left mandibular mass appeared. It has gotten larger and cause some pain. He has upper and lower partials but has not been able to wear the lower partial. He quit smoking a couple of years ago. He does not drink alcohol.  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 Pt reports completion of curative treatment.   Hyperlipemia  Per Care Everywhere chart review   Hypertension   Nuclear sclerotic cataract of both eyes 01/19/2017   Retinal detachment   Right bundle branch block  Per Care Everywhere chart review   Past Surgical History:  Procedure Laterality Date   [place]  Pt reports L2-3 disc surgery   INDIRECT LASER OPHTHALMOSCOPY Right 01/25/2017  Procedure: INDIRECT LASER OPHTHALMOSCOPY; Surgeon: Dala Dock, MD; Location: [place]; Service: Ophthalmology; Laterality: Right;   PARS PLANA VITRECTOMY Left 01/25/2017  Procedure: PARS PLANA VITRECTOMY 25G W/ LASER; Surgeon: Dala Dock, MD; Location: [place]; Service: Ophthalmology; Laterality: Left;   TOE AMPUTATION 2016   No family history of bleeding disorders, wound healing problems or difficulty with anesthesia.   Social History   Socioeconomic History   Marital status: Single  Spouse name: Not on file   Number of  children: Not on file   Years of education: Not on file   Highest education level: Not on file  Occupational History   Not on file  Tobacco Use   Smoking status: Former  Packs/day: 0.25  Years: 5.00  Pack years: 1.25  Types: Cigarettes  Quit date: 2018  Years since quitting: 4.9   Smokeless tobacco: Never  Vaping Use   Vaping Use: Never used  Substance and Sexual Activity   Alcohol use: No   Drug use: No   Sexual activity: Not Currently  Partners: Female  Other Topics Concern   Not on file  Social History Narrative   Not on file   Social Determinants of Health   Financial Resource Strain: Not on file  Food Insecurity: Not on file  Transportation Needs: Not on file  Physical Activity: Not on file  Stress: Not on file  Social Connections: Not on file  Housing Stability: Not on file   Current Outpatient Medications:   aspirin 325 MG EC tablet *ANTIPLATELET*, Take 325 mg by mouth daily. , Disp: , Rfl:   atorvastatin (LIPITOR) 40 MG tablet, Take 1 tablet by mouth daily., Disp: , Rfl:   carvedilol (COREG) 12.5 MG tablet, Take 12.5 mg by mouth 2 times daily with meals. , Disp: , Rfl:   cholecalciferol (VITAMIN D3) 1000 UNIT Tab, Take 2,000 Units by mouth daily. , Disp: , Rfl:   cloNIDine HCl (CATAPRES) 0.3 MG tablet, Take 0.3 mg by mouth 3 times daily. , Disp: , Rfl:   doxazosin (CARDURA) 4 MG  tablet, Take 2 mg by mouth 2 times daily. , Disp: , Rfl:   hydrALAZINE (APRESOLINE) 100 MG tablet, Take 100 mg by mouth 3 times daily. , Disp: , Rfl:   insulin glargine-yfgn (SEMGLEE,INSULIN GLARG-YFGN,PEN SUBQ), INJECT 36 UNITS SUBCUTANEOUSLY AT BEDTIME, Disp: , Rfl:   isosorbide mononitrate ER (IMDUR ER) 120 MG 24 hr tablet, Take 1 tablet by mouth daily., Disp: , Rfl:   polyethylene glycol (MIRALAX) 17 gram packet, Take 17 g by mouth as needed (CONSTIPATION). , Disp: , Rfl:   senna (SENOKOT) 8.6 mg tablet, Take 1 tablet by mouth as needed for Constipation. , Disp: , Rfl:   simethicone  (MYLICON,PHAZYME) 80 MG chewable tablet, 80 mg., Disp: , Rfl:   sodium chloride (OCEAN) 0.65 % nasal spray, 1 spray by Nasal route as needed for Congestion., Disp: , Rfl:   sodium zirconium cyclosilicate (LOKELMA) 10 gram powder packet, Take by mouth., Disp: , Rfl:   spironolactone (ALDACTONE) 25 MG tablet, 100 mg., Disp: , Rfl:   Current Facility-Administered Medications:   tetracaine (PF) (PONTOCAINE) 0.5 % ophthalmic solution 1 drop, 1 drop, Both Eyes, Once, Edd Arbour  A complete ROS was performed with pertinent positives/negatives noted in the HPI. The remainder of the ROS are negative.   Physical Exam:   BP 180/83   Pulse 66   Temp 97.4 F (36.3 C)   Ht 1.702 m (5\' 7" )   Wt 85.7 kg (189 lb)   BMI 29.60 kg/m   General: Healthy and alert, in no distress, breathing easily. Normal affect. In a pleasant mood. Head: Normocephalic, atraumatic. No masses, or scars. Eyes: Pupils are equal, and reactive to light. Vision is grossly intact. No spontaneous or gaze nystagmus. Ears: Ear canals are clear. Tympanic membranes are intact, with normal landmarks and the middle ears are clear and healthy. Hearing: Grossly normal. Nose: Nasal cavities are clear with healthy mucosa, no polyps or exudate. Airways are patent. Face: No masses or scars, facial nerve function is symmetric. Oral Cavity: Multiple missing teeth. 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 Large exophytic mass involving the left mandibular alveolus, measuring approximately 5 cm from front to back. This does appear to be involving the mandibular bone clinically.. Oropharynx: Tonsils are symmetric. There are no mucosal masses identified. Tongue base appears normal and healthy. Larynx/Hypopharynx: Swelling of the right supraglottic larynx unable to see clearly what this may be. Chest: Deferred Neck: No palpable masses, no cervical adenopathy, no thyroid nodules or  enlargement. Neuro: Cranial nerves II-XII with normal function. Specifically, there is no evidence of hypnesthesia of the V3 nerve. Balance: Normal gate. Other findings: none.  Independent Review of Additional Tests or Records:  none  Procedures:  Procedure note: Flexible fiberoptic laryngoscopy  Details of the procedure were explained to the patient and all questions were answered.   Procedure:   After anesthetizing the nasal cavity with topical lidocaine and oxymetazoline, the flexible endoscope was introduced and passed through the nasal cavity into the nasopharynx. The scope was then advanced to the level of the oropharynx, then the hypopharynx and larynx.   Findings:   The posterior soft palate, uvula, tongue base and vallecula were visualized and appeared healthy without mucosal masses or lesions. The epiglottis, aryepiglottic folds, hypopharynx, supraglottis, glottis were visualized and appeared healthy without mucosal masses or lesions, except for a benign appearing cyst of the right epiglottis. Vocal fold mobility was intact and symmetric.   Additional findings: None  The  scope was withdrawn from the nose. He tolerated the procedure well.   Oral biopsy:  2% Xylocaine with epinephrine was infiltrated into the left anterior mandibular alveolar mass. Biopsy forceps were used and 3 samples were taken. These were sent for pathologic evaluation. Gauze was applied for hemostasis. He tolerated this well.  Impression & Plans:  1. Chronic oral lichen planus. Active disease apparent in the right posterior buccal and mandibular mucosa.  2. Left mandibular alveolar mass. This is worrisome for carcinoma. Biopsies taken today. We will await the pathology.  3. Unable to have intravenous contrast. I would want to order a CT of the neck to evaluate the mandible, the perimandibular soft tissue, and for adenopathy but he is not able to have contrast. He had some sort of x-rays at the oral  surgeon so I will try to obtain those to see if that would be helpful.  4. Benign-appearing supraglottic cyst of no clinical significance.  5. If the mandibular lesion is positive for carcinoma he will likely need to be referred to [facility] for mandibulectomy and reconstructive surgery.

## 2021-08-03 ENCOUNTER — Other Ambulatory Visit: Payer: Self-pay

## 2021-08-03 ENCOUNTER — Encounter (HOSPITAL_COMMUNITY)
Admission: RE | Admit: 2021-08-03 | Discharge: 2021-08-03 | Disposition: A | Discharge status: Home / Self Care | Payer: Medicare (Managed Care) | Source: Ambulatory Visit | Attending: Otolaryngology | Admitting: Otolaryngology

## 2021-08-03 ENCOUNTER — Encounter (HOSPITAL_COMMUNITY): Payer: Self-pay

## 2021-08-03 VITALS — BP 150/67 | HR 71 | Temp 98.1°F | Resp 19 | Ht 67.0 in | Wt 166.6 lb

## 2021-08-03 DIAGNOSIS — E785 Hyperlipidemia, unspecified: Secondary | ICD-10-CM | POA: Diagnosis not present

## 2021-08-03 DIAGNOSIS — D649 Anemia, unspecified: Secondary | ICD-10-CM | POA: Diagnosis not present

## 2021-08-03 DIAGNOSIS — I4510 Unspecified right bundle-branch block: Secondary | ICD-10-CM | POA: Diagnosis not present

## 2021-08-03 DIAGNOSIS — Z89421 Acquired absence of other right toe(s): Secondary | ICD-10-CM | POA: Diagnosis not present

## 2021-08-03 DIAGNOSIS — Z8673 Personal history of transient ischemic attack (TIA), and cerebral infarction without residual deficits: Secondary | ICD-10-CM | POA: Diagnosis not present

## 2021-08-03 DIAGNOSIS — Z20822 Contact with and (suspected) exposure to covid-19: Secondary | ICD-10-CM | POA: Diagnosis not present

## 2021-08-03 DIAGNOSIS — Z01818 Encounter for other preprocedural examination: Secondary | ICD-10-CM

## 2021-08-03 DIAGNOSIS — N184 Chronic kidney disease, stage 4 (severe): Secondary | ICD-10-CM | POA: Insufficient documentation

## 2021-08-03 DIAGNOSIS — Z794 Long term (current) use of insulin: Secondary | ICD-10-CM | POA: Insufficient documentation

## 2021-08-03 DIAGNOSIS — Z01812 Encounter for preprocedural laboratory examination: Secondary | ICD-10-CM | POA: Diagnosis present

## 2021-08-03 DIAGNOSIS — I130 Hypertensive heart and chronic kidney disease with heart failure and stage 1 through stage 4 chronic kidney disease, or unspecified chronic kidney disease: Secondary | ICD-10-CM | POA: Insufficient documentation

## 2021-08-03 DIAGNOSIS — R7989 Other specified abnormal findings of blood chemistry: Secondary | ICD-10-CM | POA: Diagnosis not present

## 2021-08-03 DIAGNOSIS — Z8619 Personal history of other infectious and parasitic diseases: Secondary | ICD-10-CM | POA: Diagnosis not present

## 2021-08-03 DIAGNOSIS — K1370 Unspecified lesions of oral mucosa: Secondary | ICD-10-CM | POA: Diagnosis not present

## 2021-08-03 DIAGNOSIS — I739 Peripheral vascular disease, unspecified: Secondary | ICD-10-CM | POA: Diagnosis not present

## 2021-08-03 DIAGNOSIS — I5032 Chronic diastolic (congestive) heart failure: Secondary | ICD-10-CM | POA: Insufficient documentation

## 2021-08-03 DIAGNOSIS — E1122 Type 2 diabetes mellitus with diabetic chronic kidney disease: Secondary | ICD-10-CM | POA: Diagnosis not present

## 2021-08-03 HISTORY — DX: Anemia, unspecified: D64.9

## 2021-08-03 LAB — CBC
HCT: 30.6 % — ABNORMAL LOW (ref 39.0–52.0)
Hemoglobin: 9.9 g/dL — ABNORMAL LOW (ref 13.0–17.0)
MCH: 32.7 pg (ref 26.0–34.0)
MCHC: 32.4 g/dL (ref 30.0–36.0)
MCV: 101 fL — ABNORMAL HIGH (ref 80.0–100.0)
Platelets: 289 K/uL (ref 150–400)
RBC: 3.03 MIL/uL — ABNORMAL LOW (ref 4.22–5.81)
RDW: 13.2 % (ref 11.5–15.5)
WBC: 13.5 K/uL — ABNORMAL HIGH (ref 4.0–10.5)
nRBC: 0 % (ref 0.0–0.2)

## 2021-08-03 LAB — COMPREHENSIVE METABOLIC PANEL WITH GFR
ALT: 14 U/L (ref 0–44)
AST: 17 U/L (ref 15–41)
Albumin: 3.4 g/dL — ABNORMAL LOW (ref 3.5–5.0)
Alkaline Phosphatase: 57 U/L (ref 38–126)
Anion gap: 12 (ref 5–15)
BUN: 83 mg/dL — ABNORMAL HIGH (ref 8–23)
CO2: 24 mmol/L (ref 22–32)
Calcium: 9.5 mg/dL (ref 8.9–10.3)
Chloride: 97 mmol/L — ABNORMAL LOW (ref 98–111)
Creatinine, Ser: 5.4 mg/dL — ABNORMAL HIGH (ref 0.61–1.24)
GFR, Estimated: 11 mL/min — ABNORMAL LOW
Glucose, Bld: 169 mg/dL — ABNORMAL HIGH (ref 70–99)
Potassium: 4.2 mmol/L (ref 3.5–5.1)
Sodium: 133 mmol/L — ABNORMAL LOW (ref 135–145)
Total Bilirubin: 0.3 mg/dL (ref 0.3–1.2)
Total Protein: 7.9 g/dL (ref 6.5–8.1)

## 2021-08-03 LAB — GLUCOSE, CAPILLARY: Glucose-Capillary: 145 mg/dL — ABNORMAL HIGH (ref 70–99)

## 2021-08-03 LAB — SARS CORONAVIRUS 2 (TAT 6-24 HRS): SARS Coronavirus 2: NEGATIVE

## 2021-08-03 NOTE — Progress Notes (Addendum)
PCP - [facility] Cardiologist - Glenetta Hew, MD  PPM/ICD - denies Device Orders - n/a Rep Notified - n/a  Chest x-ray - n/a EKG - 07/26/2021 Stress Test - 08/27/2020 ECHO - 11/03/2016 Cardiac Cath - denies  Sleep Study - denies CPAP - n/a  Fasting Blood Sugar - patient is checking CBG randomly CBG today - 145 A1C - done in PAT on 08/03/2021  Blood Thinner Instructions: n/a  Aspirin Instructions: Patient was instructed: As of today, STOP taking any Aspirin (unless otherwise instructed by your surgeon) Aleve, Naproxen, Ibuprofen, Motrin, Advil, Goody's, BC's, all herbal medications, fish oil, and all vitamins.  ERAS Protcol - n/a  COVID TEST- done in PAT on 08/03/2021   Anesthesia review: yes - cardiac history; cardiac clearance. Abnormal labs in PAT - Dr. Constance Holster was notified via [employer]  Patient denies shortness of breath, fever, cough and chest pain at PAT appointment   All instructions explained to the patient, with a verbal understanding of the material. Patient agrees to go over the instructions while at home for a better understanding. Patient also instructed to self quarantine after being tested for COVID-19. The opportunity to ask questions was provided.

## 2021-08-03 NOTE — Progress Notes (Addendum)
Abnormal labs in PAT: Creatinine 5.40; Hgb 9.9; HCT 30.6; Albumin 3.4. This Probation officer called Dr. Constance Holster office but nobody answered. I left a message to call PAT back. Dr. Constance Holster was notified via [employer].

## 2021-08-04 LAB — HEMOGLOBIN A1C
Hgb A1c MFr Bld: 7.7 % — ABNORMAL HIGH (ref 4.8–5.6)
Mean Plasma Glucose: 174 mg/dL

## 2021-08-04 NOTE — Progress Notes (Addendum)
Anesthesia Chart Review:  Follows with cardiology for history of HFpEF, right bundle branch block, PAD s/p right to amputation, HLD, CVA 2017.  He had a low risk Myoview 08/27/2020.  He was last seen 07/26/2021 by Dr. Phineas Inches for preop evaluation.  Per note, " John Jacobson denies anginal symptoms or SOB with > 4 METS. He has CAD equivalent, but no hx of PCI. His nuclear study in March was normal. He has normal LV function. He has hx of HFpEF and is euvolemic.  His RCRI is 15% with renal dx, hx of CHF, and stroke. He is intermediate risk for a low risk procedure. No recommendations for further ischemic evaluation or medication changes. He has acceptable cardiac risk for low risk procedure."  Patient has CKD 4/5 followed by Dr. Posey Pronto at [facility].  He is not on dialysis.  Preop labs show creatinine 5.40 on 08/03/2021 which is slightly above baseline. Last OV note from Dr. Posey Pronto 06/11/21 reviewed. Per note, "Renal function remains low but stable at a GFR of around 33ml/minute without any acute electrolyte abnormalities following improved adherence with low potassium diet/Lokelma (in the setting of ongoing spironolactone use). He is euvolemic on physical exam and does not have any concerning uremic type symptoms. We discussed the stratification of chronic kidney disease and our efforts at delaying the progression of his chronic kidney disease with hypertension control, glycemic control, avoidance of NSAIDs and efforts at weight optimization. Will continue to follow him periodically and address any potential targets for therapy. 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  Hx of Hep C, pt reports treatment several years ago through the  [place].  History of CVA 2018, per notes in care everywhere, "He reports he continues to have some trouble with his "vestibular system" otherwise no other residual effects."  Labs reviewed, creatinine elevated as addressed above, mild anemia with hemoglobin 9.9, otherwise unremarkable.  EKG 07/26/21: Sinus bradycardia. Rate 55. Right bundle branch block.   Nuclear stress 08/27/2020: Nuclear stress EF: 59%. The left ventricular ejection fraction is normal (55-65%). There was no ST segment deviation noted during stress. The study is normal. This is a low risk study.   Normal stress nuclear study with no ischemia or infarction.  Gated ejection fraction 59% with normal wall motion.  TTE 11/03/2016: - Left ventricle: The cavity size was normal. Wall thickness was    increased in a pattern of severe LVH. Systolic function was    normal. The estimated ejection fraction was in the range of 60%    to 65%. Wall motion was normal; there were no regional wall    motion abnormalities. Doppler parameters are consistent with    abnormal left ventricular relaxation (grade 1 diastolic    dysfunction).  - Left atrium: The atrium was mildly dilated.   Impressions:   - Normal LV systolic function; mild diastolic dysfunction; severe    LVH with proximal septal thickening; no LVOT gradient at rest; no    SAM; mild LAE.    Wynonia Musty [facility] Short Stay Center/Anesthesiology Phone [phone] 08/05/2021 11:09 AM

## 2021-08-05 NOTE — Anesthesia Preprocedure Evaluation (Addendum)
Anesthesia Evaluation  Patient identified by MRN, date of birth, ID band Patient awake    Reviewed: Allergy & Precautions, NPO status , Patient's Chart, lab work & pertinent test results  Airway Mallampati: IV  TM Distance: >3 FB Neck ROM: Full  Mouth opening: Limited Mouth Opening  Dental  (+) Poor Dentition, Dental Advisory Given   Pulmonary asthma , former smoker,    Pulmonary exam normal breath sounds clear to auscultation       Cardiovascular hypertension, Pt. on medications and Pt. on home beta blockers + Peripheral Vascular Disease and +CHF (grade 1 diastolic dysfunction)  Normal cardiovascular exam Rhythm:Regular Rate:Normal  Echo 2018: Normal LV systolic function; mild diastolic dysfunction; severe  LVH with proximal septal thickening; no LVOT gradient at rest; no  SAM; mild LAE.   Stress test 2022 normal   Neuro/Psych CVA (2018, residual ataxia and L hemiparesis ), Residual Symptoms negative psych ROS   GI/Hepatic negative GI ROS, (+) Hepatitis -, C  Endo/Other  diabetes, Well Controlled, Type 2, Insulin Dependenta1c 6.9  Renal/GU CRFRenal diseaseCKD 4 Cr 5.4  negative genitourinary   Musculoskeletal negative musculoskeletal ROS (+)   Abdominal   Peds  Hematology  (+) Blood dyscrasia, anemia , Hb 9.9   Anesthesia Other Findings Mandibular mass: Skin thickening of the left lower face and submandibular region. Apparent asymmetric swelling of the gingiva the left mandible. Bone windows suggest slight rarefaction of the body of the mandible on the left compared to the right, raising the possibility of mandibular osteomyelitis.  Reproductive/Obstetrics negative OB ROS                            Anesthesia Physical Anesthesia Plan  ASA: 3  Anesthesia Plan: General   Post-op Pain Management: Tylenol PO (pre-op)*   Induction: Intravenous  PONV Risk Score and Plan: 2 and  Ondansetron, Midazolam and Treatment may vary due to age or medical condition  Airway Management Planned: Oral ETT and Video Laryngoscope Planned  Additional Equipment: None  Intra-op Plan:   Post-operative Plan: Extubation in OR  Informed Consent: I have reviewed the patients History and Physical, chart, labs and discussed the procedure including the risks, benefits and alternatives for the proposed anesthesia with the patient or authorized representative who has indicated his/her understanding and acceptance.     Dental advisory given and Consent reviewed with POA  Plan Discussed with: CRNA  Anesthesia Plan Comments: (Daughter at bedside, d/w her and pt the plan for GA and discussed the risks including progression of renal failure, although unlikely. 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 Labs checked today, K 5.1 Limited mouth opening but possibly 2/2 pain- I suspect will be easier after induction of GA. Will use glidescope)       Anesthesia Quick Evaluation

## 2021-08-06 ENCOUNTER — Ambulatory Visit (HOSPITAL_BASED_OUTPATIENT_CLINIC_OR_DEPARTMENT_OTHER): Payer: No Typology Code available for payment source | Admitting: Anesthesiology

## 2021-08-06 ENCOUNTER — Encounter (HOSPITAL_COMMUNITY)
Admission: RE | Disposition: A | Discharge status: Home / Self Care | Payer: Self-pay | Source: Home / Self Care | Attending: Otolaryngology

## 2021-08-06 ENCOUNTER — Encounter (HOSPITAL_COMMUNITY): Payer: Self-pay | Admitting: Otolaryngology

## 2021-08-06 ENCOUNTER — Observation Stay (HOSPITAL_COMMUNITY)
Admission: RE | Admit: 2021-08-06 | Discharge: 2021-08-07 | Disposition: A | Discharge status: Home / Self Care | Payer: No Typology Code available for payment source | Attending: Otolaryngology | Admitting: Otolaryngology

## 2021-08-06 ENCOUNTER — Ambulatory Visit (HOSPITAL_COMMUNITY): Payer: No Typology Code available for payment source | Admitting: Physician Assistant

## 2021-08-06 ENCOUNTER — Other Ambulatory Visit: Payer: Self-pay

## 2021-08-06 DIAGNOSIS — C069 Malignant neoplasm of mouth, unspecified: Secondary | ICD-10-CM | POA: Diagnosis present

## 2021-08-06 DIAGNOSIS — E1122 Type 2 diabetes mellitus with diabetic chronic kidney disease: Secondary | ICD-10-CM

## 2021-08-06 DIAGNOSIS — J45909 Unspecified asthma, uncomplicated: Secondary | ICD-10-CM | POA: Insufficient documentation

## 2021-08-06 DIAGNOSIS — I129 Hypertensive chronic kidney disease with stage 1 through stage 4 chronic kidney disease, or unspecified chronic kidney disease: Secondary | ICD-10-CM | POA: Diagnosis not present

## 2021-08-06 DIAGNOSIS — I130 Hypertensive heart and chronic kidney disease with heart failure and stage 1 through stage 4 chronic kidney disease, or unspecified chronic kidney disease: Secondary | ICD-10-CM | POA: Diagnosis not present

## 2021-08-06 DIAGNOSIS — Z794 Long term (current) use of insulin: Secondary | ICD-10-CM | POA: Diagnosis not present

## 2021-08-06 DIAGNOSIS — C44329 Squamous cell carcinoma of skin of other parts of face: Secondary | ICD-10-CM

## 2021-08-06 DIAGNOSIS — Z87891 Personal history of nicotine dependence: Secondary | ICD-10-CM | POA: Insufficient documentation

## 2021-08-06 DIAGNOSIS — N183 Chronic kidney disease, stage 3 unspecified: Secondary | ICD-10-CM | POA: Diagnosis not present

## 2021-08-06 DIAGNOSIS — Z79899 Other long term (current) drug therapy: Secondary | ICD-10-CM | POA: Insufficient documentation

## 2021-08-06 DIAGNOSIS — Z7982 Long term (current) use of aspirin: Secondary | ICD-10-CM | POA: Insufficient documentation

## 2021-08-06 DIAGNOSIS — N184 Chronic kidney disease, stage 4 (severe): Secondary | ICD-10-CM

## 2021-08-06 DIAGNOSIS — I509 Heart failure, unspecified: Secondary | ICD-10-CM | POA: Diagnosis not present

## 2021-08-06 DIAGNOSIS — C411 Malignant neoplasm of mandible: Principal | ICD-10-CM | POA: Insufficient documentation

## 2021-08-06 HISTORY — PX: EXCISION MASS NECK: SHX6703

## 2021-08-06 LAB — POCT I-STAT, CHEM 8
BUN: 111 mg/dL — ABNORMAL HIGH (ref 8–23)
Calcium, Ion: 1.17 mmol/L (ref 1.15–1.40)
Chloride: 104 mmol/L (ref 98–111)
Creatinine, Ser: 5.5 mg/dL — ABNORMAL HIGH (ref 0.61–1.24)
Glucose, Bld: 169 mg/dL — ABNORMAL HIGH (ref 70–99)
HCT: 30 % — ABNORMAL LOW (ref 39.0–52.0)
Hemoglobin: 10.2 g/dL — ABNORMAL LOW (ref 13.0–17.0)
Potassium: 5.1 mmol/L (ref 3.5–5.1)
Sodium: 138 mmol/L (ref 135–145)
TCO2: 28 mmol/L (ref 22–32)

## 2021-08-06 LAB — GLUCOSE, CAPILLARY
Glucose-Capillary: 171 mg/dL — ABNORMAL HIGH (ref 70–99)
Glucose-Capillary: 162 mg/dL — ABNORMAL HIGH (ref 70–99)
Glucose-Capillary: 171 mg/dL — ABNORMAL HIGH (ref 70–99)
Glucose-Capillary: 111 mg/dL — ABNORMAL HIGH (ref 70–99)
Glucose-Capillary: 163 mg/dL — ABNORMAL HIGH (ref 70–99)

## 2021-08-06 SURGERY — EXCISION, MASS, NECK
Anesthesia: General | Site: Mouth

## 2021-08-06 MED ORDER — ISOSORBIDE MONONITRATE ER 60 MG PO TB24
120.0000 mg | ORAL_TABLET | Freq: Every day | ORAL | Status: DC
  Administered 2021-08-06 – 2021-08-07 (×2): 120 mg via ORAL
  Filled 2021-08-06 (×2): qty 2

## 2021-08-06 MED ORDER — SUCCINYLCHOLINE CHLORIDE 200 MG/10ML IV SOSY
PREFILLED_SYRINGE | INTRAVENOUS | Status: DC | PRN
  Administered 2021-08-06: 120 mg via INTRAVENOUS

## 2021-08-06 MED ORDER — ONDANSETRON HCL 4 MG PO TABS: 4.0000 mg | ORAL_TABLET | ORAL | Status: DC | PRN

## 2021-08-06 MED ORDER — SPIRONOLACTONE 25 MG PO TABS: 25.0000 mg | ORAL_TABLET | Freq: Two times a day (BID) | ORAL | Status: DC

## 2021-08-06 MED ORDER — ASPIRIN EC 81 MG PO TBEC
81.0000 mg | DELAYED_RELEASE_TABLET | Freq: Every day | ORAL | Status: DC
Start: 2021-08-06 — End: 2021-08-07
  Administered 2021-08-06 – 2021-08-07 (×2): 81 mg via ORAL
  Filled 2021-08-06 (×2): qty 1

## 2021-08-06 MED ORDER — DEXTROSE 50 % IV SOLN
0.5000 | Freq: Once | INTRAVENOUS | Status: DC
Start: 2021-08-06 — End: 2021-08-06

## 2021-08-06 MED ORDER — MIDAZOLAM HCL 2 MG/2ML IJ SOLN
INTRAMUSCULAR | Status: AC
  Filled 2021-08-06: qty 2

## 2021-08-06 MED ORDER — VITAMIN D 25 MCG (1000 UNIT) PO TABS
2000.0000 [IU] | ORAL_TABLET | Freq: Every day | ORAL | Status: DC
  Administered 2021-08-06 – 2021-08-07 (×2): 2000 [IU] via ORAL
  Filled 2021-08-06 (×2): qty 2

## 2021-08-06 MED ORDER — ONDANSETRON HCL 4 MG/2ML IJ SOLN: 4.0000 mg | Freq: Once | INTRAMUSCULAR | Status: DC | PRN

## 2021-08-06 MED ORDER — ROCURONIUM BROMIDE 10 MG/ML (PF) SYRINGE
PREFILLED_SYRINGE | INTRAVENOUS | Status: DC | PRN
  Administered 2021-08-06: 40 mg via INTRAVENOUS

## 2021-08-06 MED ORDER — DOXAZOSIN MESYLATE 8 MG PO TABS
8.0000 mg | ORAL_TABLET | Freq: Every day | ORAL | Status: DC
  Administered 2021-08-06 – 2021-08-07 (×2): 8 mg via ORAL
  Filled 2021-08-06 (×2): qty 1

## 2021-08-06 MED ORDER — POLYVINYL ALCOHOL 1.4 % OP SOLN
1.0000 [drp] | Freq: Three times a day (TID) | OPHTHALMIC | Status: DC | PRN
  Filled 2021-08-06: qty 15

## 2021-08-06 MED ORDER — SUGAMMADEX SODIUM 200 MG/2ML IV SOLN
INTRAVENOUS | Status: DC | PRN
  Administered 2021-08-06: 200 mg via INTRAVENOUS

## 2021-08-06 MED ORDER — INSULIN ASPART 100 UNIT/ML IJ SOLN
4.0000 [IU] | Freq: Once | INTRAMUSCULAR | Status: DC
Start: 2021-08-06 — End: 2021-08-06

## 2021-08-06 MED ORDER — LIDOCAINE 2% (20 MG/ML) 5 ML SYRINGE
INTRAMUSCULAR | Status: DC | PRN
  Administered 2021-08-06: 60 mg via INTRAVENOUS

## 2021-08-06 MED ORDER — CLONIDINE HCL 0.1 MG PO TABS
0.1000 mg | ORAL_TABLET | Freq: Three times a day (TID) | ORAL | Status: DC
  Administered 2021-08-06 – 2021-08-07 (×3): 0.1 mg via ORAL
  Filled 2021-08-06 (×3): qty 1

## 2021-08-06 MED ORDER — CLINDAMYCIN PHOSPHATE 600 MG/50ML IV SOLN
600.0000 mg | Freq: Once | INTRAVENOUS | Status: AC
  Administered 2021-08-06: 600 mg via INTRAVENOUS

## 2021-08-06 MED ORDER — SENNOSIDES-DOCUSATE SODIUM 8.6-50 MG PO TABS: 2.0000 | ORAL_TABLET | Freq: Every evening | ORAL | Status: DC | PRN

## 2021-08-06 MED ORDER — BACITRACIN ZINC 500 UNIT/GM EX OINT
TOPICAL_OINTMENT | CUTANEOUS | Status: AC
  Filled 2021-08-06: qty 28.35

## 2021-08-06 MED ORDER — PHENYLEPHRINE 40 MCG/ML (10ML) SYRINGE FOR IV PUSH (FOR BLOOD PRESSURE SUPPORT)
PREFILLED_SYRINGE | INTRAVENOUS | Status: DC | PRN
  Administered 2021-08-06 (×2): 80 ug via INTRAVENOUS

## 2021-08-06 MED ORDER — SODIUM CHLORIDE 0.9 % IV SOLN: INTRAVENOUS | Status: DC

## 2021-08-06 MED ORDER — PROPOFOL 10 MG/ML IV BOLUS
INTRAVENOUS | Status: DC | PRN
  Administered 2021-08-06 (×2): 100 mg via INTRAVENOUS

## 2021-08-06 MED ORDER — CLINDAMYCIN PALMITATE HCL 75 MG/5ML PO SOLR
150.0000 mg | Freq: Three times a day (TID) | ORAL | Status: AC
  Administered 2021-08-06 – 2021-08-07 (×3): 150 mg via ORAL
  Filled 2021-08-06 (×3): qty 10

## 2021-08-06 MED ORDER — ATORVASTATIN CALCIUM 40 MG PO TABS
40.0000 mg | ORAL_TABLET | Freq: Every day | ORAL | Status: DC
Start: 2021-08-06 — End: 2021-08-07
  Administered 2021-08-06: 40 mg via ORAL
  Filled 2021-08-06: qty 1

## 2021-08-06 MED ORDER — OXYCODONE HCL 5 MG/5ML PO SOLN: 5.0000 mg | Freq: Once | ORAL | Status: DC | PRN

## 2021-08-06 MED ORDER — DEXAMETHASONE SODIUM PHOSPHATE 10 MG/ML IJ SOLN
INTRAMUSCULAR | Status: AC
  Filled 2021-08-06: qty 3

## 2021-08-06 MED ORDER — CARVEDILOL 25 MG PO TABS
25.0000 mg | ORAL_TABLET | Freq: Two times a day (BID) | ORAL | Status: DC
  Administered 2021-08-06 – 2021-08-07 (×2): 25 mg via ORAL
  Filled 2021-08-06 (×2): qty 1

## 2021-08-06 MED ORDER — CLINDAMYCIN PHOSPHATE 600 MG/50ML IV SOLN
INTRAVENOUS | Status: AC
  Filled 2021-08-06: qty 50

## 2021-08-06 MED ORDER — INSULIN GLARGINE-YFGN 100 UNIT/ML ~~LOC~~ SOLN
36.0000 [IU] | Freq: Every day | SUBCUTANEOUS | Status: DC
  Administered 2021-08-07: 36 [IU] via SUBCUTANEOUS
  Filled 2021-08-06 (×2): qty 0.36

## 2021-08-06 MED ORDER — LACTATED RINGERS IV SOLN: INTRAVENOUS | Status: DC

## 2021-08-06 MED ORDER — HYDROCERIN EX CREA
1.0000 "application " | TOPICAL_CREAM | Freq: Every day | CUTANEOUS | Status: DC
  Administered 2021-08-06 – 2021-08-07 (×2): 1 via TOPICAL
  Filled 2021-08-06: qty 113

## 2021-08-06 MED ORDER — CHLORHEXIDINE GLUCONATE 0.12 % MT SOLN
15.0000 mL | Freq: Once | OROMUCOSAL | Status: AC
  Administered 2021-08-06: 15 mL via OROMUCOSAL
  Filled 2021-08-06: qty 15

## 2021-08-06 MED ORDER — HEMOSTATIC AGENTS (NO CHARGE) OPTIME
TOPICAL | Status: DC | PRN
  Administered 2021-08-06: 1 via TOPICAL

## 2021-08-06 MED ORDER — FENTANYL CITRATE (PF) 250 MCG/5ML IJ SOLN
INTRAMUSCULAR | Status: DC | PRN
  Administered 2021-08-06: 50 ug via INTRAVENOUS
  Administered 2021-08-06: 100 ug via INTRAVENOUS

## 2021-08-06 MED ORDER — HYDROMORPHONE HCL 1 MG/ML IJ SOLN
INTRAMUSCULAR | Status: AC
  Filled 2021-08-06: qty 1

## 2021-08-06 MED ORDER — MIDAZOLAM HCL 2 MG/2ML IJ SOLN
INTRAMUSCULAR | Status: DC | PRN
  Administered 2021-08-06: 1 mg via INTRAVENOUS

## 2021-08-06 MED ORDER — DEXAMETHASONE SODIUM PHOSPHATE 10 MG/ML IJ SOLN
INTRAMUSCULAR | Status: DC | PRN
  Administered 2021-08-06: 5 mg via INTRAVENOUS

## 2021-08-06 MED ORDER — PROPOFOL 10 MG/ML IV BOLUS
INTRAVENOUS | Status: AC
  Filled 2021-08-06: qty 20

## 2021-08-06 MED ORDER — SUCCINYLCHOLINE CHLORIDE 200 MG/10ML IV SOSY
PREFILLED_SYRINGE | INTRAVENOUS | Status: AC
  Filled 2021-08-06: qty 30

## 2021-08-06 MED ORDER — ONDANSETRON HCL 4 MG/2ML IJ SOLN: 4.0000 mg | INTRAMUSCULAR | Status: DC | PRN

## 2021-08-06 MED ORDER — FENTANYL CITRATE (PF) 250 MCG/5ML IJ SOLN
INTRAMUSCULAR | Status: AC
  Filled 2021-08-06: qty 5

## 2021-08-06 MED ORDER — LIDOCAINE 2% (20 MG/ML) 5 ML SYRINGE
INTRAMUSCULAR | Status: AC
  Filled 2021-08-06: qty 10

## 2021-08-06 MED ORDER — OXYCODONE HCL 5 MG PO TABS: 5.0000 mg | ORAL_TABLET | Freq: Four times a day (QID) | ORAL | Status: DC | PRN

## 2021-08-06 MED ORDER — FUROSEMIDE 20 MG PO TABS: 20.0000 mg | ORAL_TABLET | Freq: Every day | ORAL | Status: DC

## 2021-08-06 MED ORDER — OXYCODONE HCL 5 MG PO TABS
5.0000 mg | ORAL_TABLET | ORAL | Status: DC | PRN
  Administered 2021-08-06 – 2021-08-07 (×4): 5 mg via ORAL
  Filled 2021-08-06 (×4): qty 1

## 2021-08-06 MED ORDER — LIDOCAINE-EPINEPHRINE 1 %-1:100000 IJ SOLN
INTRAMUSCULAR | Status: AC
  Filled 2021-08-06: qty 1

## 2021-08-06 MED ORDER — HYDRALAZINE HCL 50 MG PO TABS
100.0000 mg | ORAL_TABLET | Freq: Two times a day (BID) | ORAL | Status: DC
Start: 2021-08-06 — End: 2021-08-07
  Administered 2021-08-06 – 2021-08-07 (×2): 100 mg via ORAL
  Filled 2021-08-06 (×2): qty 2

## 2021-08-06 MED ORDER — INSULIN ASPART 100 UNIT/ML IJ SOLN
3.0000 [IU] | Freq: Once | INTRAMUSCULAR | Status: AC
Start: 2021-08-06 — End: 2021-08-06
  Administered 2021-08-06: 3 [IU] via SUBCUTANEOUS

## 2021-08-06 MED ORDER — POLYETHYLENE GLYCOL 3350 17 G PO PACK: 17.0000 g | PACK | Freq: Every day | ORAL | Status: DC | PRN

## 2021-08-06 MED ORDER — ORAL CARE MOUTH RINSE: 15.0000 mL | Freq: Once | OROMUCOSAL | Status: AC

## 2021-08-06 MED ORDER — MAGNESIUM OXIDE -MG SUPPLEMENT 400 (240 MG) MG PO TABS
400.0000 mg | ORAL_TABLET | Freq: Every day | ORAL | Status: DC
Start: 2021-08-06 — End: 2021-08-07
  Administered 2021-08-06 – 2021-08-07 (×2): 400 mg via ORAL
  Filled 2021-08-06 (×4): qty 1

## 2021-08-06 MED ORDER — ACETAMINOPHEN 500 MG PO TABS
1000.0000 mg | ORAL_TABLET | Freq: Two times a day (BID) | ORAL | Status: DC | PRN
Start: 2021-08-06 — End: 2021-08-07

## 2021-08-06 MED ORDER — PHENYLEPHRINE 40 MCG/ML (10ML) SYRINGE FOR IV PUSH (FOR BLOOD PRESSURE SUPPORT)
PREFILLED_SYRINGE | INTRAVENOUS | Status: AC
  Filled 2021-08-06: qty 20

## 2021-08-06 MED ORDER — OXYCODONE HCL 5 MG PO TABS: 5.0000 mg | ORAL_TABLET | Freq: Once | ORAL | Status: DC | PRN

## 2021-08-06 MED ORDER — INSULIN ASPART 100 UNIT/ML IJ SOLN
0.0000 [IU] | Freq: Three times a day (TID) | INTRAMUSCULAR | Status: DC
  Administered 2021-08-07: 3 [IU] via SUBCUTANEOUS

## 2021-08-06 MED ORDER — SODIUM BICARBONATE 650 MG PO TABS
650.0000 mg | ORAL_TABLET | Freq: Two times a day (BID) | ORAL | Status: DC
  Administered 2021-08-06 – 2021-08-07 (×2): 650 mg via ORAL
  Filled 2021-08-06 (×2): qty 1

## 2021-08-06 MED ORDER — INSULIN ASPART 100 UNIT/ML IJ SOLN
0.0000 [IU] | INTRAMUSCULAR | Status: DC | PRN
  Administered 2021-08-06: 4 [IU] via SUBCUTANEOUS

## 2021-08-06 MED ORDER — ACETAMINOPHEN 500 MG PO TABS
1000.0000 mg | ORAL_TABLET | Freq: Once | ORAL | Status: AC
  Administered 2021-08-06: 1000 mg via ORAL
  Filled 2021-08-06: qty 2

## 2021-08-06 MED ORDER — ONDANSETRON HCL 4 MG/2ML IJ SOLN
INTRAMUSCULAR | Status: DC | PRN
  Administered 2021-08-06: 4 mg via INTRAVENOUS

## 2021-08-06 MED ORDER — DEXMEDETOMIDINE (PRECEDEX) IN NS 20 MCG/5ML (4 MCG/ML) IV SYRINGE
PREFILLED_SYRINGE | INTRAVENOUS | Status: AC
  Filled 2021-08-06: qty 5

## 2021-08-06 MED ORDER — ONDANSETRON HCL 4 MG/2ML IJ SOLN
INTRAMUSCULAR | Status: AC
  Filled 2021-08-06: qty 6

## 2021-08-06 MED ORDER — ROCURONIUM BROMIDE 10 MG/ML (PF) SYRINGE
PREFILLED_SYRINGE | INTRAVENOUS | Status: AC
  Filled 2021-08-06: qty 20

## 2021-08-06 MED ORDER — 0.9 % SODIUM CHLORIDE (POUR BTL) OPTIME
TOPICAL | Status: DC | PRN
  Administered 2021-08-06: 1000 mL

## 2021-08-06 MED ORDER — HYDROMORPHONE HCL 1 MG/ML IJ SOLN
0.2500 mg | INTRAMUSCULAR | Status: DC | PRN
  Administered 2021-08-06: 0.25 mg via INTRAVENOUS
  Administered 2021-08-06: 0.5 mg via INTRAVENOUS
  Administered 2021-08-06: 0.25 mg via INTRAVENOUS

## 2021-08-06 MED ORDER — SIMETHICONE 80 MG PO CHEW: 160.0000 mg | CHEWABLE_TABLET | Freq: Every day | ORAL | Status: DC | PRN

## 2021-08-06 MED ORDER — SALINE SPRAY 0.65 % NA SOLN
1.0000 | NASAL | Status: DC | PRN
  Filled 2021-08-06: qty 44

## 2021-08-06 SURGICAL SUPPLY — 51 items
ATTRACTOMAT 16X20 MAGNETIC DRP (DRAPES) IMPLANT
BAG COUNTER SPONGE SURGICOUNT (BAG) ×2 IMPLANT
BLADE SURG 15 STRL LF DISP TIS (BLADE) IMPLANT
BLADE SURG 15 STRL SS (BLADE)
BNDG CONFORM 2 STRL LF (GAUZE/BANDAGES/DRESSINGS) IMPLANT
BNDG GAUZE ELAST 4 BULKY (GAUZE/BANDAGES/DRESSINGS) IMPLANT
BUR 7 SOFT (BURR) ×1 IMPLANT
CANISTER SUCT 3000ML PPV (MISCELLANEOUS) ×1 IMPLANT
CATH ROBINSON RED A/P 16FR (CATHETERS) IMPLANT
CLEANER TIP ELECTROSURG 2X2 (MISCELLANEOUS) ×2 IMPLANT
CNTNR URN SCR LID CUP LEK RST (MISCELLANEOUS) ×1 IMPLANT
CONT SPEC 4OZ STRL OR WHT (MISCELLANEOUS) ×2
COVER SURGICAL LIGHT HANDLE (MISCELLANEOUS) ×2 IMPLANT
DRAIN PENROSE 1/4X12 LTX STRL (WOUND CARE) IMPLANT
DRAPE HALF SHEET 40X57 (DRAPES) ×1 IMPLANT
DRSG EMULSION OIL 3X3 NADH (GAUZE/BANDAGES/DRESSINGS) IMPLANT
ELECT COATED BLADE 2.86 ST (ELECTRODE) ×2 IMPLANT
ELECT NDL TIP 2.8 STRL (NEEDLE) IMPLANT
ELECT NEEDLE TIP 2.8 STRL (NEEDLE) IMPLANT
ELECT REM PT RETURN 9FT ADLT (ELECTROSURGICAL) ×2
ELECTRODE REM PT RTRN 9FT ADLT (ELECTROSURGICAL) ×1 IMPLANT
FORCEPS BIPOLAR SPETZLER 8 1.0 (NEUROSURGERY SUPPLIES) ×1 IMPLANT
GAUZE 4X4 16PLY ~~LOC~~+RFID DBL (SPONGE) IMPLANT
GAUZE SPONGE 4X4 12PLY STRL (GAUZE/BANDAGES/DRESSINGS) IMPLANT
GLOVE SURG LTX SZ7.5 (GLOVE) ×2 IMPLANT
GOWN STRL REUS W/ TWL LRG LVL3 (GOWN DISPOSABLE) ×2 IMPLANT
GOWN STRL REUS W/TWL LRG LVL3 (GOWN DISPOSABLE) ×4
HEMOSTAT SURGICEL 2X14 (HEMOSTASIS) ×1 IMPLANT
KIT BASIN OR (CUSTOM PROCEDURE TRAY) ×2 IMPLANT
KIT TURNOVER KIT B (KITS) ×2 IMPLANT
NDL 25GX 5/8IN NON SAFETY (NEEDLE) IMPLANT
NDL PRECISIONGLIDE 27X1.5 (NEEDLE) IMPLANT
NEEDLE 25GX 5/8IN NON SAFETY (NEEDLE) IMPLANT
NEEDLE PRECISIONGLIDE 27X1.5 (NEEDLE) IMPLANT
NS IRRIG 1000ML POUR BTL (IV SOLUTION) ×2 IMPLANT
PAD ARMBOARD 7.5X6 YLW CONV (MISCELLANEOUS) ×4 IMPLANT
PENCIL FOOT CONTROL (ELECTRODE) ×2 IMPLANT
SUT CHROMIC 4 0 P 3 18 (SUTURE) ×1 IMPLANT
SUT ETHILON 4 0 PS 2 18 (SUTURE) IMPLANT
SUT ETHILON 5 0 P 3 18 (SUTURE)
SUT NYLON ETHILON 5-0 P-3 1X18 (SUTURE) IMPLANT
SUT SILK 4 0 REEL (SUTURE) ×1 IMPLANT
SUT VIC AB 3-0 SH 8-18 (SUTURE) ×1 IMPLANT
SWAB COLLECTION DEVICE MRSA (MISCELLANEOUS) IMPLANT
SWAB CULTURE ESWAB REG 1ML (MISCELLANEOUS) IMPLANT
SYR BULB IRRIG 60ML STRL (SYRINGE) ×1 IMPLANT
SYR TB 1ML LUER SLIP (SYRINGE) IMPLANT
TOWEL GREEN STERILE FF (TOWEL DISPOSABLE) ×2 IMPLANT
TRAY ENT MC OR (CUSTOM PROCEDURE TRAY) ×2 IMPLANT
WATER STERILE IRR 1000ML POUR (IV SOLUTION) ×2 IMPLANT
YANKAUER SUCT BULB TIP NO VENT (SUCTIONS) IMPLANT

## 2021-08-06 NOTE — Transfer of Care (Signed)
Immediate Anesthesia Transfer of Care Note  Patient: John A Gonder Sr.  Procedure(s) Performed: EXCISION OF MANDIBULAR MASS (Mouth)  Patient Location: PACU  Anesthesia Type:General  Level of Consciousness: awake and alert   Airway & Oxygen Therapy: Patient Spontanous Breathing  Post-op Assessment: Report given to RN and Post -op Vital signs reviewed and stable  Post vital signs: Reviewed and stable  Last Vitals:  Vitals Value Taken Time  BP 199/83 08/06/21 1224  Temp    Pulse 80 08/06/21 1225  Resp 13 08/06/21 1225  SpO2 96 % 08/06/21 1225  Vitals shown include unvalidated device data.  Last Pain:  Vitals:   08/06/21 1033  TempSrc:   PainSc: 0-No pain         Complications: No notable events documented.

## 2021-08-06 NOTE — Anesthesia Procedure Notes (Addendum)
Procedure Name: Intubation Date/Time: 08/06/2021 10:56 AM Performed by: Dorann Lodge, CRNA Pre-anesthesia Checklist: Patient identified, Emergency Drugs available, Suction available and Patient being monitored Patient Re-evaluated:Patient Re-evaluated prior to induction Oxygen Delivery Method: Circle System Utilized Preoxygenation: Pre-oxygenation with 100% oxygen Induction Type: IV induction Ventilation: Mask ventilation without difficulty Laryngoscope Size: Glidescope and 4 Grade View: Grade I Tube type: Oral Tube size: 7.5 mm Number of attempts: 1 Airway Equipment and Method: Stylet and Video-laryngoscopy Placement Confirmation: ETT inserted through vocal cords under direct vision, positive ETCO2 and breath sounds checked- equal and bilateral Secured at: 23 cm Tube secured with: Tape Dental Injury: Teeth and Oropharynx as per pre-operative assessment

## 2021-08-06 NOTE — Interval H&P Note (Signed)
History and Physical Interval Note:  08/06/2021 10:10 AM  John A Ludke Sr.  has presented today for surgery, with the diagnosis of Mandibular Mass.  The various methods of treatment have been discussed with the patient and family. After consideration of risks, benefits and other options for treatment, the patient has consented to  Procedure(s): EXCISION OF MANDIBULAR MASS (N/A) as a surgical intervention.  The patient's history has been reviewed, patient examined, no change in status, stable for surgery.  I have reviewed the patient's chart and labs.  Questions were answered to the patient's satisfaction.     Izora Gala

## 2021-08-06 NOTE — Discharge Instructions (Signed)
Rinse mouth with salt water 4 times daily.

## 2021-08-06 NOTE — Progress Notes (Signed)
Patient ID: John Coombes., male   DOB: 01-20-1956, 66 y.o.   MRN: [id number]  Feeling pretty well.  Pain somewhat improved.  AF VSS  No mouth bleeding  S/p oral cancer debridement/biopsy  Observe over night.

## 2021-08-06 NOTE — Anesthesia Postprocedure Evaluation (Signed)
Anesthesia Post Note  Patient: Drayk A Herrmann Sr.  Procedure(s) Performed: EXCISION OF MANDIBULAR MASS (Mouth)     Patient location during evaluation: PACU Anesthesia Type: General Level of consciousness: awake and alert, oriented and patient cooperative Pain management: pain level controlled Vital Signs Assessment: post-procedure vital signs reviewed and stable Respiratory status: spontaneous breathing, nonlabored ventilation and respiratory function stable Cardiovascular status: blood pressure returned to baseline and stable Postop Assessment: no apparent nausea or vomiting Anesthetic complications: no   No notable events documented.  Last Vitals:  Vitals:   08/06/21 1505 08/06/21 1632  BP: (!) 169/93 (!) 170/79  Pulse: 71 73  Resp: 18 17  Temp:  37.3 C  SpO2: 96% 98%    Last Pain:  Vitals:   08/06/21 1632  TempSrc: Oral  PainSc:                  Pervis Hocking

## 2021-08-06 NOTE — Op Note (Signed)
OPERATIVE REPORT  DATE OF SURGERY: 08/06/2021  PATIENT:  John Drown Sr.,  66 y.o. male  PRE-OPERATIVE DIAGNOSIS:  Mandibular Mass  POST-OPERATIVE DIAGNOSIS:  Mandibular Mass  PROCEDURE:  Procedure(s): Excisional biopsy OF MANDIBULAR MASS  SURGEON:  Beckie Salts, MD  ASSISTANTS: None  ANESTHESIA:   General   EBL: 350 ml  DRAINS: None  LOCAL MEDICATIONS USED:  None  SPECIMEN: 1.  Left mandibular alveolar mass, frozen section, positive for squamous cell carcinoma. 2.  Left mandibular alveolar mass for routine.  No margins. 3.  Left posterior mandibular ramus marrow.  COUNTS:  Correct  PROCEDURE DETAILS: The patient was taken to the operating room and placed on the operating table in the supine position. Following induction of general endotracheal anesthesia, the face was draped in a standard fashion.  Cheek retractors were used throughout the case.  The large mass was identified along the left mandibular ramus extending from the mental foramen to the angle.  Laterally into the buccal mucosa and medially into the floor of mouth not involving the tongue.  There was 1 loose tooth in the anterior portion.  The lesion bled very easily.  A large biopsy was taken using electrocautery for dissection.  This was sent for pathologic valuation.  The mass was debulked down to the mandible to allow for a closure and also for hemostasis.  Electrocautery was used to dissect.  Bipolar cautery and 4-0 silk ties were used for larger caliber vessels encountered along the way.  This was not meant to be an oncologic resection however it was necessary to obtain adequate biopsy material and then to be able to close the wound and to provide hemostasis.  After the gross disease was removed a round cutting bur high-speed drill was used to smooth out the mandibular bone.  The posterior mandibular ramus there was soft tissue encountered within the marrow space that was suspicious for carcinoma.  Sample was  sent for pathologic evaluation separately marked.  Bone wax was used along the mandible for hemostasis.  Surgicel was then placed also along the mandible to provide additional hemostasis.  The wound was reapproximated in a single layer using interrupted 3-0 Vicryl suture.  The oral cavity and pharynx were suctioned of blood and secretions.  Patient was awakened extubated and transferred to recovery in stable condition.    PATIENT DISPOSITION:  To PACU, stable

## 2021-08-07 ENCOUNTER — Encounter (HOSPITAL_COMMUNITY): Payer: Self-pay | Admitting: Otolaryngology

## 2021-08-07 DIAGNOSIS — C411 Malignant neoplasm of mandible: Secondary | ICD-10-CM | POA: Diagnosis not present

## 2021-08-07 LAB — GLUCOSE, CAPILLARY: Glucose-Capillary: 157 mg/dL — ABNORMAL HIGH (ref 70–99)

## 2021-08-07 NOTE — Progress Notes (Signed)
Pt needs a ride home today. Contacted SW and she will call me back.

## 2021-08-07 NOTE — TOC CM/SW Note (Signed)
Taxi voucher provided to patient for discharge home.   [address], LCSW Transition of Care [phone]

## 2021-08-07 NOTE — Plan of Care (Signed)

## 2021-08-07 NOTE — Plan of Care (Signed)

## 2021-08-07 NOTE — Discharge Summary (Signed)
Physician Discharge Summary  Patient ID: John A Menton Sr. MRN: [id number] DOB/AGE: [birth year] 67 y.o.  Admit date: 08/06/2021 Discharge date: 08/07/2021  Admission Diagnoses: Oral cancer  Discharge Diagnoses:  Principal Problem:   Oral cancer [facility])   Discharged Condition: good  Hospital Course: 66 year old male with oral cancer presented for biopsy.  See operative note.  He was observed overnight and did fairly well with controlled pain.  He did have diarrhea overnight.  On POD 1, he was felt stable for discharge home.  Consults: None  Significant Diagnostic Studies: None  Treatments: surgery: excisional biopsy of mandibular mass  Discharge Exam: Blood pressure 125/63, pulse (!) 56, temperature 98 F (36.7 C), temperature source Oral, resp. rate 16, height 5\' 7"  (1.702 m), weight 75.3 kg, SpO2 100 %. General appearance: alert, cooperative, and no distress Throat: left lower alveolus incision intact with sutures in place  Disposition: Discharge disposition: 01-Home or Self Care       Discharge Instructions     Diet - low sodium heart healthy   Complete by: As directed    Discharge instructions   Complete by: As directed    Resume liquid diet.  Advance as tolerated.   Increase activity slowly   Complete by: As directed    No wound care   Complete by: As directed       Allergies as of 08/07/2021       Reactions   Amlodipine Besy-benazepril Hcl Anaphylaxis, Shortness Of Breath, Swelling   Mouth and tongue swelling   Shellfish Allergy Anaphylaxis, Shortness Of Breath, Swelling        Medication List     TAKE these medications    acetaminophen 500 MG tablet Commonly known as: TYLENOL Take 1,000 mg by mouth 2 (two) times daily as needed (for pain or headaches).   aspirin 81 MG EC tablet Take 81 mg by mouth daily.   atorvastatin 40 MG tablet Commonly known as: LIPITOR Take 40 mg by mouth daily.   Basaglar KwikPen 100 UNIT/ML Inject 36 Units into  the skin daily.   camphor-menthol lotion Commonly known as: SARNA Apply 1 application topically daily.   Carboxymethylcellulose Sodium 1 % Gel Place 1 drop into both eyes 3 (three) times daily as needed (dry eyes).   carvedilol 25 MG tablet Commonly known as: COREG Take 25 mg by mouth every 12 (twelve) hours.   cloNIDine 0.1 MG tablet Commonly known as: CATAPRES Take 1 tablet by mouth 3 (three) times daily.   doxazosin 8 MG tablet Commonly known as: CARDURA Take 8 mg by mouth daily.   eucerin cream Apply 1 application topically See admin instructions. Apply to both feet daily after showering   MINERIN CREME EX Apply 1 application topically daily as needed (itching/dry skin).   furosemide 20 MG tablet Commonly known as: LASIX Take 20 mg by mouth daily.   hydrALAZINE 100 MG tablet Commonly known as: APRESOLINE Take 1 tablet (100 mg total) by mouth 3 (three) times daily. What changed: when to take this   isosorbide mononitrate 120 MG 24 hr tablet Commonly known as: IMDUR Take 120 mg by mouth daily.   lidocaine 2 % solution Commonly known as: XYLOCAINE Use as directed 5 mLs in the mouth or throat 4 (four) times daily.   Lokelma 10 g Pack packet Generic drug: sodium zirconium cyclosilicate Take 10 g by mouth daily.   magnesium oxide 400 MG tablet Commonly known as: MAG-OX Take 400 mg by mouth daily.  oxyCODONE 5 MG immediate release tablet Commonly known as: Oxy IR/ROXICODONE Take 5 mg by mouth every 6 (six) hours as needed for severe pain.   polyethylene glycol 17 g packet Commonly known as: MIRALAX / GLYCOLAX Take 17 g by mouth daily as needed for moderate constipation.   senna-docusate 8.6-50 MG tablet Commonly known as: Senokot-S Take 2 tablets by mouth at bedtime as needed for mild constipation.   simethicone 80 MG chewable tablet Commonly known as: MYLICON Chew 150 mg by mouth daily as needed for flatulence.   sodium bicarbonate 650 MG  tablet Take 650 mg by mouth 2 (two) times daily.   sodium chloride 0.65 % Soln nasal spray Commonly known as: OCEAN Place 1 spray into both nostrils as needed for congestion.   spironolactone 25 MG tablet Commonly known as: ALDACTONE Take 25 mg by mouth 2 (two) times daily.   Vitamin D3 50 MCG (2000 UT) Tabs Take 2,000 Units by mouth daily.        Follow-up Information     Izora Gala, MD. Schedule an appointment as soon as possible for a visit in 1 week(s).   Specialty: Otolaryngology Contact information: [address] [place] [address] [phone]                 Signed: Melida Quitter 08/07/2021, 8:57 AM

## 2021-08-09 LAB — SURGICAL PATHOLOGY

## 2021-08-25 NOTE — Progress Notes (Deleted)
? ? ?Office Visit  ?  ?Patient Name: John WAKEFIELD Sr. ?Date of Encounter: 08/26/2021 ? ?Primary Care Provider:  Pcp, No ?Primary Cardiologist:  Glenetta Hew, MD ? ?Chief Complaint  ?  ?66 year old male with a history of chronic diastolic heart failure, RBBB, chronic chest pain with low risk Myoview in March 2022, PAD s/p R toe amputation, hypertension, hyperlipidemia, CVA, type 2 diabetes, CKD stage IV, hepatitis C, and mandibular mass, and former tobacco use who presents for follow-up related to heart failure. ? ?Past Medical History  ?  ?Past Medical History:  ?Diagnosis Date  ? Anemia   ? Asthma   ? Chronic kidney disease   ? Chronic lower back pain 1995  ? Lumbar back surgery  ? Diabetic retinopathy ([place])   ? PDR OU  ? DM (diabetes mellitus) type II controlled with renal manifestation ([place])   ? CKD-4, peripheral neuropathy, PAD  ? Erectile dysfunction due to diabetes mellitus ([place])   ? Hepatitis C   ? Hyperlipidemia associated with type 2 diabetes mellitus ([place])   ? Hypertensive heart disease with congestive heart failure and chronic kidney disease ([place])   ? TTE May 2018: Normal LV size and severe LVH-without LVOT gradient/S.A.M.  Normal EF 60 to 65%.?  GR 1 DD.  Mild LA dilation.;  CKD IV  - Cr 3.2  ? Hypertensive retinopathy   ? OU  ? RBBB (right bundle branch block)   ? Resistant hypertension   ? Managed by Dr. Posey Pronto from nephrology and [facility]  ? Secondary hyperaldosteronism ([place])   ? Stroke [facility]) 10/2016  ? Has residual ataxia and partial left-sided hemiparesis  ? ?Past Surgical History:  ?Procedure Laterality Date  ? [place]  ? CATARACT EXTRACTION Bilateral   ? EXCISION MASS NECK N/A 08/06/2021  ? Procedure: EXCISION OF MANDIBULAR MASS;  Surgeon: Izora Gala, MD;  Location: [facility];  Service: ENT;  Laterality: N/A;  ? EYE SURGERY Bilateral   ? Cat Sx  ? PARS PLANA VITRECTOMY Left 01/25/2017  ? TOE AMPUTATION Right 2016  ? Right second toe; dry gangrene  ? TRANSTHORACIC ECHOCARDIOGRAM   10/2016  ? TTE May 2018: Normal LV size and severe LVH-without LVOT gradient/S.A.M.  Normal EF 60 to 65%.?  GR 1 DD.  Mild LA dilation.  ? ? ?Allergies ? ?Allergies  ?Allergen Reactions  ? Amlodipine Besy-Benazepril Hcl Anaphylaxis, Shortness Of Breath and Swelling  ?  Mouth and tongue swelling  ? Shellfish Allergy Anaphylaxis, Shortness Of Breath and Swelling  ? ? ?History of Present Illness  ? ?66 year old male with the above past medical history including chronic diastolic heart failure, RBBB, chronic chest pain with low risk Myoview in March 2022, PAD s/p R toe amputation, hypertension, hyperlipidemia, CVA, type 2 diabetes, CKD stage IV, hepatitis C, mandibular mass, and former tobacco use. ? ?Most recent echocardiogram in 2018 showed EF 60 to 65%, severe LVH, G1 DD, mildly dilated LA.  Lexiscan Myoview in March 2022 in the setting of chronic chest pain was normal. He follows Dr. Gwenlyn Found for symptomatic PAD , he had ABIs in March 2022 that showed right ABI of 0.71 and a left of 0.96. No critical limb ischemia, symptoms were thought to be more consistent with neuropathy.  Conservative management was recommended.  He does have CKD stage IV, however, angiography was felt to be a greater risk than benefit.  He saw Dr. Harl Bowie on 07/26/2021 for preoperative clearance for excisional biopsy of mandibular  mass from a cardiac standpoint. ? ?He presents today for follow-up.  Since his last visit ? ?Chronic diastolic heart failure/LVH: Echo in 2018 showed EF 60 to 65%, severe LVH, G1 DD, mildly dilated LA.  ?Chronic chest pain: Lexiscan Myoview in March 2022 in the setting of chronic chest pain was normal. ?PAD: Follows Dr. Gwenlyn Found. ABIs in March 2022 that showed right ABI of 0.71 and a left of 0.96. No critical limb ischemia, symptoms were thought to be more consistent with neuropathy.  Conservative management recommended.   ?Hypertension: BP well controlled. Continue current antihypertensive regimen.  ? ?Hyperlipidemia: LDL  was 58 in 2021. Due for repeat lipids.  ?CKD stage IV: Creatinine was 5.40 in February 2023.  Recommend follow-up with nephrology. ?Type 2 diabetes: A1c was 7.7 in February 2023.  Monitored and managed by PCP. ?Disposition: ? ?Home Medications  ?  ?Current Outpatient Medications  ?Medication Sig Dispense Refill  ? acetaminophen (TYLENOL) 500 MG tablet Take 1,000 mg by mouth 2 (two) times daily as needed (for pain or headaches).    ? aspirin 81 MG EC tablet Take 81 mg by mouth daily.    ? atorvastatin (LIPITOR) 40 MG tablet Take 40 mg by mouth daily.    ? camphor-menthol (SARNA) lotion Apply 1 application topically daily.    ? Carboxymethylcellulose Sodium 1 % GEL Place 1 drop into both eyes 3 (three) times daily as needed (dry eyes).    ? carvedilol (COREG) 25 MG tablet Take 25 mg by mouth every 12 (twelve) hours.    ? Cholecalciferol (VITAMIN D3) 2000 units TABS Take 2,000 Units by mouth daily.    ? cloNIDine (CATAPRES) 0.1 MG tablet Take 1 tablet by mouth 3 (three) times daily.    ? doxazosin (CARDURA) 8 MG tablet Take 8 mg by mouth daily.    ? furosemide (LASIX) 20 MG tablet Take 20 mg by mouth daily.    ? hydrALAZINE (APRESOLINE) 100 MG tablet Take 1 tablet (100 mg total) by mouth 3 (three) times daily. (Patient taking differently: Take 100 mg by mouth 2 (two) times daily.) 90 tablet 1  ? Insulin Glargine (BASAGLAR KWIKPEN) 100 UNIT/ML Inject 36 Units into the skin daily.    ? isosorbide mononitrate (IMDUR) 120 MG 24 hr tablet Take 120 mg by mouth daily.    ? lidocaine (XYLOCAINE) 2 % solution Use as directed 5 mLs in the mouth or throat 4 (four) times daily.    ? magnesium oxide (MAG-OX) 400 MG tablet Take 400 mg by mouth daily.    ? oxyCODONE (OXY IR/ROXICODONE) 5 MG immediate release tablet Take 5 mg by mouth every 6 (six) hours as needed for severe pain.    ? polyethylene glycol (MIRALAX / GLYCOLAX) 17 g packet Take 17 g by mouth daily as needed for moderate constipation.    ? senna-docusate (SENOKOT-S)  8.6-50 MG tablet Take 2 tablets by mouth at bedtime as needed for mild constipation.    ? simethicone (MYLICON) 80 MG chewable tablet Chew 160 mg by mouth daily as needed for flatulence.    ? Skin Protectants, Misc. (EUCERIN) cream Apply 1 application topically See admin instructions. Apply to both feet daily after showering    ? Skin Protectants, Misc. (MINERIN CREME EX) Apply 1 application topically daily as needed (itching/dry skin).    ? sodium bicarbonate 650 MG tablet Take 650 mg by mouth 2 (two) times daily.    ? sodium chloride (OCEAN) 0.65 % SOLN nasal spray Place 1  spray into both nostrils as needed for congestion.    ? sodium zirconium cyclosilicate (LOKELMA) 10 g PACK packet Take 10 g by mouth daily.    ? spironolactone (ALDACTONE) 25 MG tablet Take 25 mg by mouth 2 (two) times daily.    ? ?Current Facility-Administered Medications  ?Medication Dose Route Frequency Provider Last Rate Last Admin  ? Bevacizumab (AVASTIN) SOLN 1.25 mg  1.25 mg Intravitreal  Bernarda Caffey, MD   1.25 mg at 07/26/18 1106  ? Bevacizumab (AVASTIN) SOLN 1.25 mg  1.25 mg Intravitreal  Bernarda Caffey, MD   1.25 mg at 07/26/18 1106  ? Bevacizumab (AVASTIN) SOLN 1.25 mg  1.25 mg Intravitreal  Bernarda Caffey, MD   1.25 mg at 08/24/18 1650  ? Bevacizumab (AVASTIN) SOLN 1.25 mg  1.25 mg Intravitreal  Bernarda Caffey, MD   1.25 mg at 08/24/18 1651  ?  ? ?Review of Systems  ?  ?***.  All other systems reviewed and are otherwise negative except as noted above. ?  ? ?Physical Exam  ?  ?VS:  There were no vitals taken for this visit. , BMI There is no height or weight on file to calculate BMI. ?    ?GEN: Well nourished, well developed, in no acute distress. ?HEENT: normal. ?Neck: Supple, no JVD, carotid bruits, or masses. ?Cardiac: RRR, no murmurs, rubs, or gallops. No clubbing, cyanosis, edema.  Radials/DP/PT 2+ and equal bilaterally.  ?Respiratory:  Respirations regular and unlabored, clear to auscultation bilaterally. ?GI: Soft, nontender,  nondistended, BS + x 4. ?MS: no deformity or atrophy. ?Skin: warm and dry, no rash. ?Neuro:  Strength and sensation are intact. ?Psych: Normal affect. ? ?Accessory Clinical Findings  ?  ?ECG personally revie

## 2021-08-26 ENCOUNTER — Ambulatory Visit: Payer: Medicare (Managed Care) | Admitting: Nurse Practitioner

## 2021-09-01 ENCOUNTER — Other Ambulatory Visit: Payer: Self-pay | Admitting: Vascular Surgery

## 2021-09-01 ENCOUNTER — Other Ambulatory Visit: Payer: Self-pay | Admitting: Family Medicine

## 2021-09-01 DIAGNOSIS — D0439 Carcinoma in situ of skin of other parts of face: Secondary | ICD-10-CM

## 2021-09-01 DIAGNOSIS — Z0001 Encounter for general adult medical examination with abnormal findings: Secondary | ICD-10-CM

## 2021-09-02 ENCOUNTER — Other Ambulatory Visit (HOSPITAL_COMMUNITY): Payer: Self-pay | Admitting: Vascular Surgery

## 2021-09-02 ENCOUNTER — Encounter: Payer: Self-pay | Admitting: Nurse Practitioner

## 2021-09-02 DIAGNOSIS — C411 Malignant neoplasm of mandible: Secondary | ICD-10-CM

## 2021-09-09 ENCOUNTER — Ambulatory Visit: Payer: Medicare (Managed Care) | Admitting: Physician Assistant

## 2021-09-13 ENCOUNTER — Other Ambulatory Visit: Payer: Self-pay | Admitting: Radiology

## 2021-09-14 ENCOUNTER — Encounter (HOSPITAL_COMMUNITY): Payer: Self-pay

## 2021-09-14 ENCOUNTER — Ambulatory Visit (HOSPITAL_COMMUNITY)
Admission: RE | Admit: 2021-09-14 | Discharge: 2021-09-14 | Disposition: A | Discharge status: Home / Self Care | Payer: Medicare (Managed Care) | Source: Ambulatory Visit | Attending: Radiology | Admitting: Radiology

## 2021-09-14 ENCOUNTER — Ambulatory Visit (HOSPITAL_COMMUNITY)
Admission: RE | Admit: 2021-09-14 | Discharge: 2021-09-14 | Disposition: A | Discharge status: Home / Self Care | Payer: Medicare (Managed Care) | Source: Ambulatory Visit | Attending: Family Medicine | Admitting: Family Medicine

## 2021-09-14 ENCOUNTER — Ambulatory Visit (HOSPITAL_COMMUNITY): Payer: No Typology Code available for payment source

## 2021-09-14 ENCOUNTER — Ambulatory Visit (HOSPITAL_COMMUNITY)
Admission: RE | Admit: 2021-09-14 | Discharge: 2021-09-14 | Disposition: A | Discharge status: Home / Self Care | Payer: Medicare (Managed Care) | Source: Ambulatory Visit | Attending: Vascular Surgery | Admitting: Vascular Surgery

## 2021-09-14 DIAGNOSIS — Z79899 Other long term (current) drug therapy: Secondary | ICD-10-CM | POA: Diagnosis not present

## 2021-09-14 DIAGNOSIS — R948 Abnormal results of function studies of other organs and systems: Secondary | ICD-10-CM | POA: Insufficient documentation

## 2021-09-14 DIAGNOSIS — D631 Anemia in chronic kidney disease: Secondary | ICD-10-CM | POA: Diagnosis not present

## 2021-09-14 DIAGNOSIS — I130 Hypertensive heart and chronic kidney disease with heart failure and stage 1 through stage 4 chronic kidney disease, or unspecified chronic kidney disease: Secondary | ICD-10-CM | POA: Diagnosis not present

## 2021-09-14 DIAGNOSIS — E1122 Type 2 diabetes mellitus with diabetic chronic kidney disease: Secondary | ICD-10-CM | POA: Insufficient documentation

## 2021-09-14 DIAGNOSIS — N184 Chronic kidney disease, stage 4 (severe): Secondary | ICD-10-CM | POA: Diagnosis not present

## 2021-09-14 DIAGNOSIS — Z8673 Personal history of transient ischemic attack (TIA), and cerebral infarction without residual deficits: Secondary | ICD-10-CM | POA: Insufficient documentation

## 2021-09-14 DIAGNOSIS — E1142 Type 2 diabetes mellitus with diabetic polyneuropathy: Secondary | ICD-10-CM | POA: Diagnosis not present

## 2021-09-14 DIAGNOSIS — Z794 Long term (current) use of insulin: Secondary | ICD-10-CM | POA: Insufficient documentation

## 2021-09-14 DIAGNOSIS — C411 Malignant neoplasm of mandible: Secondary | ICD-10-CM | POA: Insufficient documentation

## 2021-09-14 DIAGNOSIS — I700 Atherosclerosis of aorta: Secondary | ICD-10-CM | POA: Insufficient documentation

## 2021-09-14 DIAGNOSIS — E785 Hyperlipidemia, unspecified: Secondary | ICD-10-CM | POA: Insufficient documentation

## 2021-09-14 DIAGNOSIS — E11319 Type 2 diabetes mellitus with unspecified diabetic retinopathy without macular edema: Secondary | ICD-10-CM | POA: Diagnosis not present

## 2021-09-14 DIAGNOSIS — I509 Heart failure, unspecified: Secondary | ICD-10-CM | POA: Diagnosis not present

## 2021-09-14 DIAGNOSIS — Z7982 Long term (current) use of aspirin: Secondary | ICD-10-CM | POA: Insufficient documentation

## 2021-09-14 LAB — CBC WITH DIFFERENTIAL/PLATELET
Abs Immature Granulocytes: 0.09 10*3/uL — ABNORMAL HIGH (ref 0.00–0.07)
Basophils Absolute: 0.1 10*3/uL (ref 0.0–0.1)
Basophils Relative: 0 %
Eosinophils Absolute: 0.2 10*3/uL (ref 0.0–0.5)
Eosinophils Relative: 1 %
HCT: 30.7 % — ABNORMAL LOW (ref 39.0–52.0)
Hemoglobin: 9.3 g/dL — ABNORMAL LOW (ref 13.0–17.0)
Immature Granulocytes: 1 %
Lymphocytes Relative: 11 %
Lymphs Abs: 1.8 10*3/uL (ref 0.7–4.0)
MCH: 31.8 pg (ref 26.0–34.0)
MCHC: 30.3 g/dL (ref 30.0–36.0)
MCV: 105.1 fL — ABNORMAL HIGH (ref 80.0–100.0)
Monocytes Absolute: 1.6 10*3/uL — ABNORMAL HIGH (ref 0.1–1.0)
Monocytes Relative: 9 %
Neutro Abs: 13.7 10*3/uL — ABNORMAL HIGH (ref 1.7–7.7)
Neutrophils Relative %: 78 %
Platelets: 293 10*3/uL (ref 150–400)
RBC: 2.92 MIL/uL — ABNORMAL LOW (ref 4.22–5.81)
RDW: 15.4 % (ref 11.5–15.5)
WBC: 17.4 10*3/uL — ABNORMAL HIGH (ref 4.0–10.5)
nRBC: 0 % (ref 0.0–0.2)

## 2021-09-14 LAB — BASIC METABOLIC PANEL
Anion gap: 9 (ref 5–15)
BUN: 35 mg/dL — ABNORMAL HIGH (ref 8–23)
CO2: 17 mmol/L — ABNORMAL LOW (ref 22–32)
Calcium: 9.2 mg/dL (ref 8.9–10.3)
Chloride: 112 mmol/L — ABNORMAL HIGH (ref 98–111)
Creatinine, Ser: 2.64 mg/dL — ABNORMAL HIGH (ref 0.61–1.24)
GFR, Estimated: 26 mL/min — ABNORMAL LOW (ref >60)
Glucose, Bld: 169 mg/dL — ABNORMAL HIGH (ref 70–99)
Potassium: 5.1 mmol/L (ref 3.5–5.1)
Sodium: 138 mmol/L (ref 135–145)

## 2021-09-14 LAB — GLUCOSE, CAPILLARY: Glucose-Capillary: 156 mg/dL — ABNORMAL HIGH (ref 70–99)

## 2021-09-14 LAB — PROTIME-INR
INR: 1.3 — ABNORMAL HIGH (ref 0.8–1.2)
Prothrombin Time: 15.6 seconds — ABNORMAL HIGH (ref 11.4–15.2)

## 2021-09-14 MED ORDER — CEFAZOLIN SODIUM-DEXTROSE 2-4 GM/100ML-% IV SOLN: 2.0000 g | INTRAVENOUS | Status: DC

## 2021-09-14 MED ORDER — SODIUM CHLORIDE 0.9 % IV SOLN: INTRAVENOUS | Status: DC

## 2021-09-14 NOTE — H&P (Addendum)
? ? ?Referring Physician(s): ?Miller,Brittany M,NP ? ?Supervising Physician: Aletta Edouard ? ?Patient Status:  [facility] ? ?Chief Complaint: ? ?"I'm here to get a feeding tube" ? ?Subjective: ?Patient known to IR service from random core liver biopsy in 2003.  He has a past medical history significant for asthma, anemia, chronic kidney disease, diabetes, hepatitis C, hyperlipidemia, CHF, hypertension, bundle branch block, prior stroke 2018 with some occasional ataxia and partial left-sided weakness, prior tobacco abuse and now with newly diagnosed squamous cell carcinoma of the mandible.  He presents today for percutaneous gastrostomy to assist with nutritional support while undergoing treatment.  He currently denies fever, headache, chest pain, dyspnea, abdominal pain, back pain, nausea, vomiting or bleeding.  He has had weight loss, anorexia, occasional cough. ? ?Past Medical History:  ?Diagnosis Date  ? Anemia   ? Asthma   ? Chronic kidney disease   ? Chronic lower back pain 1995  ? Lumbar back surgery  ? Diabetic retinopathy ([place])   ? PDR OU  ? DM (diabetes mellitus) type II controlled with renal manifestation ([place])   ? CKD-4, peripheral neuropathy, PAD  ? Erectile dysfunction due to diabetes mellitus ([place])   ? Hepatitis C   ? Hyperlipidemia associated with type 2 diabetes mellitus ([place])   ? Hypertensive heart disease with congestive heart failure and chronic kidney disease ([place])   ? TTE May 2018: Normal LV size and severe LVH-without LVOT gradient/S.A.M.  Normal EF 60 to 65%.?  GR 1 DD.  Mild LA dilation.;  CKD IV  - Cr 3.2  ? Hypertensive retinopathy   ? OU  ? RBBB (right bundle branch block)   ? Resistant hypertension   ? Managed by Dr. Posey Pronto from nephrology and [facility]  ? Secondary hyperaldosteronism ([place])   ? Stroke [facility]) 10/2016  ? Has residual ataxia and partial left-sided hemiparesis  ? ?Past Surgical History:  ?Procedure Laterality Date  ? [place]  ? CATARACT EXTRACTION Bilateral   ?  EXCISION MASS NECK N/A 08/06/2021  ? Procedure: EXCISION OF MANDIBULAR MASS;  Surgeon: Izora Gala, MD;  Location: [facility];  Service: ENT;  Laterality: N/A;  ? EYE SURGERY Bilateral   ? Cat Sx  ? PARS PLANA VITRECTOMY Left 01/25/2017  ? TOE AMPUTATION Right 2016  ? Right second toe; dry gangrene  ? TRANSTHORACIC ECHOCARDIOGRAM  10/2016  ? TTE May 2018: Normal LV size and severe LVH-without LVOT gradient/S.A.M.  Normal EF 60 to 65%.?  GR 1 DD.  Mild LA dilation.  ? ? ? ? ?Allergies: ?Amlodipine besy-benazepril hcl and Shellfish allergy ? ?Medications: ?Prior to Admission medications   ?Medication Sig Start Date End Date Taking? Authorizing Provider  ?acetaminophen (TYLENOL) 500 MG tablet Take 1,000 mg by mouth 2 (two) times daily as needed (for pain or headaches).    [provider]  ?aspirin 81 MG EC tablet Take 81 mg by mouth daily. 05/20/21   [provider]  ?atorvastatin (LIPITOR) 40 MG tablet Take 40 mg by mouth daily. 05/20/21   [provider]  ?camphor-menthol Timoteo Ace) lotion Apply 1 application topically daily.    [provider]  ?Carboxymethylcellulose Sodium 1 % GEL Place 1 drop into both eyes 3 (three) times daily as needed (dry eyes).    [provider]  ?carvedilol (COREG) 25 MG tablet Take 25 mg by mouth every 12 (twelve) hours.    [provider]  ?Cholecalciferol (VITAMIN D3) 2000 units TABS Take 2,000 Units  by mouth daily.    [provider]  ?cloNIDine (CATAPRES) 0.1 MG tablet Take 1 tablet by mouth 3 (three) times daily. 05/20/21   [provider]  ?doxazosin (CARDURA) 8 MG tablet Take 8 mg by mouth daily.    [provider]  ?furosemide (LASIX) 20 MG tablet Take 20 mg by mouth daily.    [provider]  ?hydrALAZINE (APRESOLINE) 100 MG tablet Take 1 tablet (100 mg total) by mouth 3 (three) times daily. ?Patient taking differently: Take 100 mg by mouth 2 (two) times daily. 11/11/16   Angiulli, Lavon Paganini, PA-C   ?Insulin Glargine (BASAGLAR KWIKPEN) 100 UNIT/ML Inject 36 Units into the skin daily.    [provider]  ?isosorbide mononitrate (IMDUR) 120 MG 24 hr tablet Take 120 mg by mouth daily.    [provider]  ?lidocaine (XYLOCAINE) 2 % solution Use as directed 5 mLs in the mouth or throat 4 (four) times daily.    [provider]  ?magnesium oxide (MAG-OX) 400 MG tablet Take 400 mg by mouth daily. 05/22/20   [provider]  ?oxyCODONE (OXY IR/ROXICODONE) 5 MG immediate release tablet Take 5 mg by mouth every 6 (six) hours as needed for severe pain.    [provider]  ?polyethylene glycol (MIRALAX / GLYCOLAX) 17 g packet Take 17 g by mouth daily as needed for moderate constipation.    [provider]  ?senna-docusate (SENOKOT-S) 8.6-50 MG tablet Take 2 tablets by mouth at bedtime as needed for mild constipation.    [provider]  ?simethicone (MYLICON) 80 MG chewable tablet Chew 160 mg by mouth daily as needed for flatulence. 03/03/20   [provider]  ?Skin Protectants, Misc. (EUCERIN) cream Apply 1 application topically See admin instructions. Apply to both feet daily after showering    [provider]  ?Skin Protectants, Misc. (MINERIN CREME EX) Apply 1 application topically daily as needed (itching/dry skin).    [provider]  ?sodium bicarbonate 650 MG tablet Take 650 mg by mouth 2 (two) times daily. 06/03/20   [provider]  ?sodium chloride (OCEAN) 0.65 % SOLN nasal spray Place 1 spray into both nostrils as needed for congestion.    [provider]  ?sodium zirconium cyclosilicate (LOKELMA) 10 g PACK packet Take 10 g by mouth daily.    [provider]  ?spironolactone (ALDACTONE) 25 MG tablet Take 25 mg by mouth 2 (two) times daily. 12/09/18   [provider]  ? ? ? ?Vital Signs: ?BP (!) 162/79   Pulse 71   Temp 98.2 ?F (36.8 ?C) (Oral)   Resp 18   SpO2 100%  ? ?Physical Exam  awake, alert.  Chest with distant breath sounds bilaterally.  Heart with regular rate and rhythm.  Abdomen soft, positive bowel sounds, nontender.  Some mild bilateral pretibial edema noted. ? ?Imaging: ?No results found. ? ?Labs: ? ?CBC: ?Recent Labs  ?  08/03/21 ?1019 08/06/21 ?1017  ?WBC 13.5*  --   ?HGB 9.9* 10.2*  ?HCT 30.6* 30.0*  ?PLT 289  --   ? ? ?COAGS: ?No results for input(s): INR, APTT in the last 8760 hours. ? ?BMP: ?Recent Labs  ?  08/03/21 ?1019 08/06/21 ?1017  ?NA 133* 138  ?K 4.2 5.1  ?CL 97* 104  ?CO2 24  --   ?GLUCOSE 169* 169*  ?BUN 83* 111*  ?CALCIUM 9.5  --   ?CREATININE 5.40* 5.50*  ?GFRNONAA 11*  --   ? ? ?  LIVER FUNCTION TESTS: ?Recent Labs  ?  08/03/21 ?1019  ?BILITOT 0.3  ?AST 17  ?ALT 14  ?ALKPHOS 57  ?PROT 7.9  ?ALBUMIN 3.4*  ? ? ?Assessment and Plan: ?Patient known to IR service from random core liver biopsy in 2003.  He has a past medical history significant for asthma, anemia, chronic kidney disease, diabetes, hepatitis C, hyperlipidemia, CHF, hypertension, bundle branch block, prior stroke 2018 with some occasional ataxia and partial left-sided weakness, prior tobacco abuse and now with newly diagnosed squamous cell carcinoma of the mandible.  He presents today for percutaneous gastrostomy to assist with nutritional support while undergoing treatment. Risks and benefits image guided gastrostomy tube placement was discussed with the patient including, but not limited to the need for a barium enema during the procedure, bleeding, infection, peritonitis and/or damage to adjacent structures. ? ?All of the patient's questions were answered, patient is agreeable to proceed. ? ?Consent signed and in chart. ? ?Patient did not receive oral barium to take prior to G-tube placement therefore we will proceed with limited CT abdomen today to assess anatomy before procedure. ? ? ?Electronically Signed: ?Autumn Messing, PA-C ?09/14/2021, 8:58 AM ? ? ?I spent a total of 25 Minutes at the the  patient's bedside AND on the patient's hospital floor or unit, greater than 50% of which was counseling/coordinating care for percutaneous gastrostomy tube placement ? ? ? ? ? ?

## 2021-09-14 NOTE — Progress Notes (Signed)
Patient ID: Delton Coombes., male   DOB: [birth year], 66 y.o.   MRN: [id number] ?Spoke with patient's referring provider Clemens Catholic, NP) regarding findings of today's CT abdomen and contraindication for percutaneous gastrostomy tube placement due to unfavorable anatomy.  They will arrange for surgical consultation.  Plans also discussed with patient's family. ?

## 2021-09-14 NOTE — Progress Notes (Addendum)
Patient has returned from IR. Unable to do procedure and waiting on information about when to discharge patient. Patient needs surgery referral.    ? ?[place] PA spoke with patient and family about unablility to place G tube in IR today and need for surgical consultation. ?

## 2021-09-20 ENCOUNTER — Other Ambulatory Visit: Payer: Non-veteran care

## 2021-09-22 ENCOUNTER — Ambulatory Visit: Payer: Medicare (Managed Care) | Admitting: Gastroenterology

## 2021-09-23 ENCOUNTER — Other Ambulatory Visit: Payer: Non-veteran care

## 2021-11-23 ENCOUNTER — Ambulatory Visit: Payer: Non-veteran care

## 2021-11-23 ENCOUNTER — Institutional Professional Consult (permissible substitution): Payer: Non-veteran care | Admitting: Radiation Oncology

## 2021-11-24 ENCOUNTER — Ambulatory Visit
Admission: RE | Admit: 2021-11-24 | Discharge: 2021-11-24 | Disposition: A | Discharge status: Home / Self Care | Payer: Self-pay | Source: Ambulatory Visit | Attending: Radiation Oncology | Admitting: Radiation Oncology

## 2021-11-24 ENCOUNTER — Telehealth: Payer: Self-pay | Admitting: Hematology and Oncology

## 2021-11-24 ENCOUNTER — Other Ambulatory Visit: Payer: Self-pay

## 2021-11-24 DIAGNOSIS — C069 Malignant neoplasm of mouth, unspecified: Secondary | ICD-10-CM

## 2021-11-24 NOTE — Progress Notes (Signed)
Oncology Nurse Navigator Documentation   Fax sent to [facility] requesting the following imaging be pushed to [facility].   09/08/21 PET 09/08/21 MRI Brain 09/21/21 CT face/sinus's wo contrast 09/22/21 MRI face/sinus's WWO contrast 10/14/21 CT neck/thyroid wo contrast  Notification of successful fax transmission received. Orders placed for assignment to [facility].   Harlow Asa RN, BSN, OCN Head & Neck Oncology Nurse [place] [facility] Phone # [phone]  Fax # [phone]

## 2021-11-24 NOTE — Telephone Encounter (Signed)
Called pt's daughter to set up appt with Dr. Chryl Heck next week. I spoke to her and she stated she does not think he will need to see medical oncology as he will not be having chemo. She said she is going to f/u with the pt's oncology specialist at the [facility] and will call me back if they do decide to move forward with this appt. I let her know I only have one slot open next week to see Dr. Chryl Heck and asked if she would like me to sch him there just so the slot is reserved. She declined. Msg sent to nurse navigator with update.

## 2021-11-26 NOTE — Progress Notes (Incomplete)
Skin vs H&N (both??)

## 2021-11-29 ENCOUNTER — Institutional Professional Consult (permissible substitution): Payer: Non-veteran care | Admitting: Radiation Oncology

## 2021-11-29 NOTE — Progress Notes (Signed)
Radiation Oncology         [phone] ________________________________  Initial Outpatient Consultation  Name: John A Tendler Sr. MRN: [id number]  Date: 11/30/2021  DOB: 09/24/1955  CC:Pcp, No  Blaykock, Rosalio Loud, FNP   REFERRING PHYSICIAN: Wray Nation, FNP  DIAGNOSIS: No diagnosis found.  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 is a 66 y.o. male who presented to Dr. Constance Holster on 05/28/21 for evaluation of an enlarging left mandibular mass which the patient first noticed 2 months prior. The patient also noted having multiple benign biopsies of the mass in the past. Dr. Constance Holster collected of a biopsy of the mass during this visit which was negative for malignancy, with superficial fragments of squamous epithelium with mild atypia and bacterial overgrowth. (Oral examination at the time of this visit also revealed chronic oral lichen planus, with active disease apparent in the right posterior buccal and mandibular mucosa).   Soft tissue neck CT performed on 06/24/21 showed skin thickening of the left lower face and submandibular region. Apparent asymmetric swelling of the gingiva about the the left mandible were also appreciated. CT also showed bone windows suggesting slight rarefaction of the body of the mandible on the left compared to the right, raising the possibility of mandibular osteomyelitis. No abnormal lymph nodes were appreciated.  Dr. Constance Holster collected additional biopsies of the left mandibular mass under anesthesia on 08/06/21. Final pathology showed findings consistent with invasive well-differentiated squamous cell carcinoma, (keratinizing). Biopsy of the left posterior mandibular ramus marrow space also showed invasive SCC.   Accordingly, the patient was referred to Dr. Conley Canal, [facility], on 08/30/21 for further management  and to discus treatment options. During this visit, the patient reported numbness to his chin, pain when he tries to open his mouth, and an approximately 30 pound weight loss since last fall. For further evaluation, Dr. Conley Canal recommended an MRI of the face and whole body PET scan. In terms of treatment options, the patient agreed to tentatively proceed with hemimandibulectomy, likely scapular free flap reconstruction, bilateral neck dissection, tracheotomy, and and gastric feeding tube placement.  Head and Neck PET scan on 09/08/21 showed the hypermetabolic mass centered within the left oral cavity, involving the left hemimandible, corresponding to the biopsy-proven squamous cell carcinoma. Mild increased uptake was also appreciated in bilateral level 1B cervical lymph nodes concerning for nodal metastasis. PET also showed focal uptake within the left parietal region raising the question of brain metastasis, as well as scattered hypermetabolic foci within the liver parenchyma without definite CT correlate, concerning for hepatic metastases.  Before the patient could proceed with surgical intervention, he was hospitalized from 09/08/21 09/13/21 due to failure to thrive in the setting of his cancer. The patient presented to the [facility] ED on 03/29 with worsening left jaw pain, and ongoing dysphagia and odynophagia with significant weight loss that accelerated to the point that the patient's family felt uncomfortable with the patient continuing at home. Upon presentation, the patient was appreciated to have an obvious left anterior jaw fungating mass along the gumline, and very noticeable difficulty swallowing his own oral secretions. MRI of the brain during this admission showed no evidence of intracranial metastasis. MRI of the liver also showed no evidence of metastatic disease. Given the patient's dysphagia, he underwent FEES evaluation and was cleared for pured diet with aspiration precautions. He was able to  tolerate small amounts of oral intake with this diet. [facility] was also consulted during this admission and recommended PEG tube placement.  The patient was unable to get OP PEG tube placed on 09/14/21 due to unfavorable anatomy.  Unfortunately, the patient soon returned to the ED and was admitted on 09/21/21 with worsening functional status, inability to tolerate oral intake, and failure to thrive. Given that he was unable to get PEG tube placed as OP, ENT and MIS were consulted and the patient underwent G tube placement while admitted on 09/24/21. He was also started on Tfs. Following hospital course, the patient was discharged on 09/29/21 with plans for surgery following discharge. Imaging performed during this admission includes:   - CT of face/sinuses which showed an interval increase in bulk of mass surrounding the left body of the mandible, extending to the buccal surface and the retromolar trigone, corresponding to biopsy-proven squamous cell carcinoma. CT also showed interval worsening of associated left hemimandible bone erosion with interval development of a pathologic fracture through the body of the left hemimandible, and a slight interval increase in size of right level 1A and 1B lymph nodes. No necrotic appearing lymph nodes were identified.  -MRI of the face and sinuses on 04/12 showed similar findings to CT above in addition to evidence of regional involvement of the left inferior alveolar nerve along the mandibular body. MRI also showed indeterminate prominent and mildly enlarged submental, submandibular, and upper cervical chain lymph nodes.  Accordingly, the patient proceeded to undergo a left hemi-mandibulectomy with nodal biopsies on 10/05/21 under the care of Dr. Conley Canal. Final pathology from the procedure revealed: tumor the size of 7.7 cm; histology of well differentiated invasive squamous cell carcinoma invading the mandibular bone with LVI (negative for PNI); all margins free of  tumor; nodal status of 75/75 regional lymph nodes examined negative for carcinoma;   The patient was admitted to the ICU post-operatively for monitoring. While admitted, the patient was initially hypoglycemic but later became hyperglycemic when placed on decadron. He also had acute worsening of his chronic anemia and required several units of blood - 5 pRBCS on the day of procedure and then three more over the following two weeks. Additionally, he developed drainage from the left neck decision and had a partial dehiscence all from worsening dependent ecchymosis. He had a penrose drain placed to aid drainage. PT and OT were then consulted and who recommended rehab admission. He was recently discharged from rehab on 11/22/21.  Imaging performed during this admission includes:   - CT of the neck/thyroid on 10/14/21 showed postoperative changes related to recent resection of a mandibular mass with partial left mandibulectomy, bilateral neck dissection, and flap reconstruction. CT also showed multiple fluid collections within the neck, some of which appeared hemorrhagic. The largest fluid collection was seen to involve the left greater than right submandibular and submental regions.  - Abdominal US on 11/07/21 showed trace ascites in the upper abdomen  Swallowing issues, if any: inability to tolerate oral intake (dysphagia and odynophagia), now has g-tube   Weight Changes: significant weight loss due to lack of oral intake  Pain status:  pain when he tries to open his mouth  Other symptoms: numbness to his chin, overall failure to thrive due to his disease prior to surgery  Tobacco history, if any: quit smoking 3 years ago (1/4 pack per day)  ETOH abuse, if any: does not drink  Prior cancers, if any: ***  PREVIOUS RADIATION THERAPY: No  PAST MEDICAL HISTORY:  has a past medical history of Anemia, Asthma, Chronic kidney disease, Chronic lower back pain (1995), Diabetic retinopathy ([place]), DM (diabetes  mellitus) type II controlled with renal manifestation ([place]), Erectile dysfunction due to diabetes mellitus ([place]), Hepatitis C, Hyperlipidemia associated with type 2 diabetes mellitus ([place]), Hypertensive heart disease with congestive heart failure and chronic kidney disease ([place]), Hypertensive retinopathy, RBBB (right bundle branch block), Resistant hypertension, Secondary hyperaldosteronism ([place]), and Stroke ([place]) (10/2016).    PAST SURGICAL HISTORY: Past Surgical History:  Procedure Laterality Date   BACK SURGERY  1995   CATARACT EXTRACTION Bilateral    EXCISION MASS NECK N/A 08/06/2021   Procedure: EXCISION OF MANDIBULAR MASS;  Surgeon: Izora Gala, MD;  Location: [place];  Service: ENT;  Laterality: N/A;   EYE SURGERY Bilateral    Cat Sx   PARS PLANA VITRECTOMY Left 01/25/2017   TOE AMPUTATION Right 2016   Right second toe; dry gangrene   TRANSTHORACIC ECHOCARDIOGRAM  10/2016   TTE May 2018: Normal LV size and severe LVH-without LVOT gradient/S.A.M.  Normal EF 60 to 65%.?  GR 1 DD.  Mild LA dilation.    FAMILY HISTORY: family history includes Alzheimer's disease in an other family member; Coronary artery disease in his brother and mother; Diabetes in his brother, mother, sister, and another family member; Hypertension in an other family member; Macular degeneration in his mother.  SOCIAL HISTORY:  reports that he quit smoking about 3 years ago. His smoking use included cigarettes. He smoked an average of .25 packs per day. He has never used smokeless tobacco. He reports that he does not drink alcohol and does not use drugs.  ALLERGIES: Amlodipine besy-benazepril hcl and Shellfish allergy  MEDICATIONS:  Current Outpatient Medications  Medication Sig Dispense Refill   acetaminophen (TYLENOL) 500 MG tablet Take 1,000 mg by mouth 2 (two) times daily as needed (for pain or headaches).     aspirin 81 MG EC tablet Take 81 mg by mouth daily.     atorvastatin (LIPITOR) 40 MG tablet Take 40 mg by  mouth daily.     camphor-menthol (SARNA) lotion Apply 1 application topically daily.     Carboxymethylcellulose Sodium 1 % GEL Place 1 drop into both eyes 3 (three) times daily as needed (dry eyes).     carvedilol (COREG) 25 MG tablet Take 25 mg by mouth every 12 (twelve) hours.     Cholecalciferol (VITAMIN D3) 2000 units TABS Take 2,000 Units by mouth daily.     cloNIDine (CATAPRES) 0.1 MG tablet Take 1 tablet by mouth 3 (three) times daily.     doxazosin (CARDURA) 8 MG tablet Take 8 mg by mouth daily.     furosemide (LASIX) 20 MG tablet Take 20 mg by mouth daily.     hydrALAZINE (APRESOLINE) 100 MG tablet Take 1 tablet (100 mg total) by mouth 3 (three) times daily. (Patient taking differently: Take 100 mg by mouth 2 (two) times daily.) 90 tablet 1   Insulin Glargine (BASAGLAR KWIKPEN) 100 UNIT/ML Inject 36 Units into the skin daily.     isosorbide mononitrate (IMDUR) 120 MG 24 hr tablet Take 120 mg by mouth daily.     lidocaine (XYLOCAINE) 2 % solution Use as directed 5 mLs in the mouth or throat 4 (four) times daily.     magnesium oxide (MAG-OX) 400 MG tablet Take 400 mg by mouth daily.     oxyCODONE (OXY IR/ROXICODONE) 5 MG immediate release tablet Take 5 mg by mouth  every 6 (six) hours as needed for severe pain.     polyethylene glycol (MIRALAX / GLYCOLAX) 17 g packet Take 17 g by mouth daily as needed for moderate constipation.     senna-docusate (SENOKOT-S) 8.6-50 MG tablet Take 2 tablets by mouth at bedtime as needed for mild constipation.     simethicone (MYLICON) 80 MG chewable tablet Chew 160 mg by mouth daily as needed for flatulence.     Skin Protectants, Misc. (EUCERIN) cream Apply 1 application topically See admin instructions. Apply to both feet daily after showering     Skin Protectants, Misc. (MINERIN CREME EX) Apply 1 application topically daily as needed (itching/dry skin).     sodium bicarbonate 650 MG tablet Take 650 mg by mouth 2 (two) times daily.     sodium chloride  (OCEAN) 0.65 % SOLN nasal spray Place 1 spray into both nostrils as needed for congestion.     sodium zirconium cyclosilicate (LOKELMA) 10 g PACK packet Take 10 g by mouth daily.     spironolactone (ALDACTONE) 25 MG tablet Take 25 mg by mouth 2 (two) times daily.     Current Facility-Administered Medications  Medication Dose Route Frequency Provider Last Rate Last Admin   Bevacizumab (AVASTIN) SOLN 1.25 mg  1.25 mg Intravitreal  Bernarda Caffey, MD   1.25 mg at 07/26/18 1106   Bevacizumab (AVASTIN) SOLN 1.25 mg  1.25 mg Intravitreal  Bernarda Caffey, MD   1.25 mg at 07/26/18 1106   Bevacizumab (AVASTIN) SOLN 1.25 mg  1.25 mg Intravitreal  Bernarda Caffey, MD   1.25 mg at 08/24/18 1650   Bevacizumab (AVASTIN) SOLN 1.25 mg  1.25 mg Intravitreal  Bernarda Caffey, MD   1.25 mg at 08/24/18 1651    REVIEW OF SYSTEMS:  Notable for that above.   PHYSICAL EXAM:  vitals were not taken for this visit.   General: Alert and oriented, in no acute distress HEENT: Head is normocephalic. Extraocular movements are intact. Oropharynx is notable for ***. Neck: Neck is notable for *** Heart: Regular in rate and rhythm with no murmurs, rubs, or gallops. Chest: Clear to auscultation bilaterally, with no rhonchi, wheezes, or rales. Abdomen: Soft, nontender, nondistended, with no rigidity or guarding. Extremities: No cyanosis or edema. Lymphatics: see Neck Exam Skin: No concerning lesions. Musculoskeletal: symmetric strength and muscle tone throughout. Neurologic: Cranial nerves II through XII are grossly intact. No obvious focalities. Speech is fluent. Coordination is intact. Psychiatric: Judgment and insight are intact. Affect is appropriate.   ECOG = ***  0 - Asymptomatic (Fully active, able to carry on all predisease activities without restriction)  1 - Symptomatic but completely ambulatory (Restricted in physically strenuous activity but ambulatory and able to carry out work of a light or sedentary nature. For  example, light housework, office work)  2 - Symptomatic, <50% in bed during the day (Ambulatory and capable of all self care but unable to carry out any work activities. Up and about more than 50% of waking hours)  3 - Symptomatic, >50% in bed, but not bedbound (Capable of only limited self-care, confined to bed or chair 50% or more of waking hours)  4 - Bedbound (Completely disabled. Cannot carry on any self-care. Totally confined to bed or chair)  5 - Death   Eustace Pen MM, Creech RH, Tormey DC, et al. [phone]). "Toxicity and response criteria of the [facility]". [place] Oncol. 5 (6): 649-55   LABORATORY DATA:  Lab Results  Component Value  Date   WBC 17.4 (H) 09/14/2021   HGB 9.3 (L) 09/14/2021   HCT 30.7 (L) 09/14/2021   MCV 105.1 (H) 09/14/2021   PLT 293 09/14/2021   CMP     Component Value Date/Time   NA 138 09/14/2021 0850   K 5.1 09/14/2021 0850   CL 112 (H) 09/14/2021 0850   CO2 17 (L) 09/14/2021 0850   GLUCOSE 169 (H) 09/14/2021 0850   BUN 35 (H) 09/14/2021 0850   CREATININE 2.64 (H) 09/14/2021 0850   CALCIUM 9.2 09/14/2021 0850   PROT 7.9 08/03/2021 1019   ALBUMIN 3.4 (L) 08/03/2021 1019   AST 17 08/03/2021 1019   ALT 14 08/03/2021 1019   ALKPHOS 57 08/03/2021 1019   BILITOT 0.3 08/03/2021 1019   GFRNONAA 26 (L) 09/14/2021 0850   GFRAA 14 (L) 02/12/2018 0313      Lab Results  Component Value Date   TSH 9.461 (H) 03/29/2016     RADIOGRAPHY: No results found.    IMPRESSION/PLAN:  This is a delightful patient with head and neck cancer. I *** recommend radiotherapy for this patient.  We discussed the potential risks, benefits, and side effects of radiotherapy. We talked in detail about acute and late effects. We discussed that some of the most bothersome acute effects may be mucositis, dysgeusia, salivary changes, skin irritation, hair loss, dehydration, weight loss and fatigue. We talked about late effects which include but are not  necessarily limited to dysphagia, hypothyroidism, nerve injury, vascular injury, spinal cord injury, xerostomia, trismus, neck edema, and potential injury to any of the tissues in the head and neck region. No guarantees of treatment were given. A consent form was signed and placed in the patient's medical record. The patient is enthusiastic about proceeding with treatment. I look forward to participating in the patient's care.    Simulation (treatment planning) will take place ***  We also discussed that the treatment of head and neck cancer is a multidisciplinary process to maximize treatment outcomes and quality of life. For this reason the following referrals have been or will be made:  *** Medical oncology to discuss chemotherapy   *** Dentistry for dental evaluation, possible extractions in the radiation fields, and /or advice on reducing risk of cavities, osteoradionecrosis, or other oral issues.  *** Nutritionist for nutrition support during and after treatment.  *** Speech language pathology for swallowing and/or speech therapy.  *** Social work for social support.   *** Physical therapy due to risk of lymphedema in neck and deconditioning.  *** Baseline labs including TSH.  On date of service, in total, I spent *** minutes on this encounter. Patient was seen in person.  __________________________________________   Eppie Gibson, MD  This document serves as a record of services personally performed by Eppie Gibson, MD. It was created on her behalf by Roney Mans, a trained medical scribe. The creation of this record is based on the scribe's personal observations and the provider's statements to them. This document has been checked and approved by the attending provider.

## 2021-11-29 NOTE — Progress Notes (Signed)
Oncology Nurse Navigator Documentation   Placed introductory call to new referral patient John Jacobson. I spoke with his daughter, Cori Razor.  Introduced myself as the H&N oncology nurse navigator that works with Dr. Isidore Moos to whom he has been referred by Dr.  Royston Sinner. She confirmed understanding of referral. Briefly explained my role as his navigator, provided my contact information.  Confirmed understanding of upcoming appts and [place] location, explained arrival and registration process.  I encouraged them to call with questions/concerns as he moves forward with appts and procedures.   She verbalized understanding of information provided, expressed appreciation for my call.   Navigator Initial Assessment Employment Status: he is retired Currently on [employer] / STD: no Living Situation: He lives in [place]: daughters, family PCP: [place] PCD: na Financial Concerns: not at this time Transportation Needs: yes, from SNF. Using [facility] on his first day.  Sensory Deficits: no Language Barriers/Interpreter Needed:  difficulty speaking after oral surgery in April 2023 Ambulation Needs: yes, wheelchair or walker.  DME Used in Home: na  Psychosocial Needs:  na  Concerns/Needs Understanding Cancer:  addressed/answered by navigator to best of ability Self-Expressed Needs: no   Harlow Asa RN, BSN, OCN Head & Neck Oncology Nurse [place] at [facility] Phone # [phone]  Fax # [phone]

## 2021-11-29 NOTE — Progress Notes (Signed)
Head and Neck Cancer Location of Tumor / Histology: ***  Patient presented *** months ago with symptoms of: ***  Biopsies revealed:  10/05/2021 Final Pathologic Diagnosis   A. RIGHT NECK LEVEL 1 B:  - Lymph nodes with plasmacytosis (see Comment) - Submandibular gland without significant pathologic changes          B. TOOTH:  - Gross identification with photograph C. LEFT PROXIMAL V3:  - Negative for carcinoma D. LEFT HEMI-MANDIBULECTOMY: - Well differentiated, invasive squamous cell carcinoma (7.7 cm; depth 35m)         - Tumor invades into mandibular bone - Lymphovascular invasion is seen; perineural invasion is not seen - Bone and soft tissue margins are free of tumor 1A & 1B LYMPH NODES:   - Salivary glands without significant pathologic changes - Lymph nodes without metastatic carcinoma (0/6) E. LEFT NECK LEVELS 2A & 3:  - Negative for metastatic carcinoma (0/23) F. LEFT NECK LEVEL 2B:  - Negative for metastatic carcinoma (0/6) G. RIGHT NECK LEVELS 2A & 3:  - Negative for metastatic carcinoma (0/27) H. RIGHT NECK LEVEL 2B :  - Negative for metastatic carcinoma (0/6) I. VENTRAL TONGUE MUSCLE:  - Negative for malignancy  Nutrition Status Yes No Comments  Weight changes? '[]'$  '[]'$    Swallowing concerns? '[]'$  '[]'$    PEG? '[x]'$  '[]'$     Referrals Yes No Comments  Social Work? '[x]'$  '[]'$    Dentistry? '[]'$  '[]'$    Swallowing therapy? '[]'$  '[]'$    Nutrition? '[]'$  '[]'$    Med/Onc? '[]'$  '[]'$     Safety Issues Yes No Comments  Prior radiation? '[]'$  '[]'$    Pacemaker/ICD? '[]'$  '[]'$    Possible current pregnancy? '[]'$  '[x]'$  N/A  Is the patient on methotrexate? '[]'$  '[]'$     Tobacco/Marijuana/Snuff/ETOH use: {:18581}  Past/Anticipated interventions by otolaryngology, if any:  10/05/2021 --Dr. CFredricka BoninePR REMV MALIG JAW BONE RADICAL   PR BONE-SKIN GRAFT, MICROVASCULAR   PR SPLIT GRFT TRUNK,ARM,LEG <100 SQCM   PR REMOVAL NODES, NECK,CERV MOD RAD   PR TRACHEOSTOMY, PLANNED   PR TRACHEOSTOMY,FENESTRATN+FLAPS    PR RECONSTR MANDIBLE,BONE PLATE   PR INTRODUCTION LONG GI TUBE SEPARATE PROCEDURE   PR FREE SKIN FLAP W MQuintanaANAST   PR EXTRACTION ERUPTED TOOTH/EXR   PR DENTAL SURGERY PROCEDURE   PR INFRATEMPORAL APPROACH/PREAURICULAR   PR RECONSTRUC MOUTH COMPLEX   PR MUSC MYOQ/FSCQ FLAP HEAD&NECK W/NAMED VASC PEDCL   PR HYOID MYOTOMY & SUSPENSION   NECK DISSECTION MODIFIED RADICAL <6HRS   MANDIBULECTOMY (PARTIAL) WITH PLATING   TRACHEOTOMY   FREE FLAP SCAPULAR   SPLIT THICKNESS SKIN GRAFT   Past/Anticipated interventions by medical oncology, if any: {:18581}   Current Complaints / other details:  ***

## 2021-11-30 ENCOUNTER — Ambulatory Visit
Admission: RE | Admit: 2021-11-30 | Discharge: 2021-11-30 | Disposition: A | Discharge status: Home / Self Care | Payer: No Typology Code available for payment source | Source: Ambulatory Visit | Attending: Radiation Oncology | Admitting: Radiation Oncology

## 2021-11-30 ENCOUNTER — Other Ambulatory Visit: Payer: Self-pay

## 2021-11-30 ENCOUNTER — Encounter: Payer: Self-pay | Admitting: Radiation Oncology

## 2021-11-30 VITALS — BP 123/68 | HR 65 | Temp 97.5°F | Resp 18 | Wt 153.4 lb

## 2021-11-30 DIAGNOSIS — I509 Heart failure, unspecified: Secondary | ICD-10-CM | POA: Diagnosis not present

## 2021-11-30 DIAGNOSIS — E1169 Type 2 diabetes mellitus with other specified complication: Secondary | ICD-10-CM | POA: Diagnosis not present

## 2021-11-30 DIAGNOSIS — I130 Hypertensive heart and chronic kidney disease with heart failure and stage 1 through stage 4 chronic kidney disease, or unspecified chronic kidney disease: Secondary | ICD-10-CM | POA: Diagnosis not present

## 2021-11-30 DIAGNOSIS — R634 Abnormal weight loss: Secondary | ICD-10-CM | POA: Insufficient documentation

## 2021-11-30 DIAGNOSIS — Z8673 Personal history of transient ischemic attack (TIA), and cerebral infarction without residual deficits: Secondary | ICD-10-CM | POA: Diagnosis not present

## 2021-11-30 DIAGNOSIS — R627 Adult failure to thrive: Secondary | ICD-10-CM | POA: Diagnosis not present

## 2021-11-30 DIAGNOSIS — E11319 Type 2 diabetes mellitus with unspecified diabetic retinopathy without macular edema: Secondary | ICD-10-CM | POA: Diagnosis not present

## 2021-11-30 DIAGNOSIS — E785 Hyperlipidemia, unspecified: Secondary | ICD-10-CM | POA: Insufficient documentation

## 2021-11-30 DIAGNOSIS — E1165 Type 2 diabetes mellitus with hyperglycemia: Secondary | ICD-10-CM | POA: Insufficient documentation

## 2021-11-30 DIAGNOSIS — Z7982 Long term (current) use of aspirin: Secondary | ICD-10-CM | POA: Diagnosis not present

## 2021-11-30 DIAGNOSIS — D649 Anemia, unspecified: Secondary | ICD-10-CM | POA: Insufficient documentation

## 2021-11-30 DIAGNOSIS — E1122 Type 2 diabetes mellitus with diabetic chronic kidney disease: Secondary | ICD-10-CM | POA: Diagnosis not present

## 2021-11-30 DIAGNOSIS — Z87891 Personal history of nicotine dependence: Secondary | ICD-10-CM | POA: Insufficient documentation

## 2021-11-30 DIAGNOSIS — Z79899 Other long term (current) drug therapy: Secondary | ICD-10-CM | POA: Insufficient documentation

## 2021-11-30 DIAGNOSIS — J45909 Unspecified asthma, uncomplicated: Secondary | ICD-10-CM | POA: Insufficient documentation

## 2021-11-30 DIAGNOSIS — C411 Malignant neoplasm of mandible: Secondary | ICD-10-CM | POA: Diagnosis not present

## 2021-11-30 DIAGNOSIS — C069 Malignant neoplasm of mouth, unspecified: Secondary | ICD-10-CM

## 2021-11-30 DIAGNOSIS — G8929 Other chronic pain: Secondary | ICD-10-CM | POA: Insufficient documentation

## 2021-11-30 DIAGNOSIS — R200 Anesthesia of skin: Secondary | ICD-10-CM | POA: Diagnosis not present

## 2021-11-30 NOTE — Progress Notes (Signed)
Oncology Nurse Navigator Documentation   Met with patient during initial consult with Dr. Isidore Moos. His daughter was present on speaker phone.  Further introduced myself as his/their Navigator, explained my role as a member of the Care Team. Provided New Patient Information packet: Contact information for physician, this navigator, other members of the Care Team Advance Directive information ([place] blue pamphlet with LCSW insert); provided [facility] AD booklet at his request,  Fall Prevention Patient [place] Information sheet Symptom Management Clinic information [facility] campus map with highlight of [place] SLP Information sheet Assisted with post-consult appt scheduling. He is scheduled for CT simulation on 12/03/21.   They verbalized understanding of information provided. I encouraged them to call with questions/concerns moving forward.  Harlow Asa, RN, BSN, OCN Head & Neck Oncology Nurse [place] at [facility] [phone]

## 2021-12-01 ENCOUNTER — Ambulatory Visit: Payer: Non-veteran care | Admitting: Hematology and Oncology

## 2021-12-03 ENCOUNTER — Other Ambulatory Visit: Payer: Self-pay

## 2021-12-03 ENCOUNTER — Ambulatory Visit
Admission: RE | Admit: 2021-12-03 | Discharge: 2021-12-03 | Disposition: A | Discharge status: Home / Self Care | Payer: No Typology Code available for payment source | Source: Ambulatory Visit | Attending: Radiation Oncology | Admitting: Radiation Oncology

## 2021-12-03 DIAGNOSIS — Z79899 Other long term (current) drug therapy: Secondary | ICD-10-CM | POA: Diagnosis not present

## 2021-12-03 DIAGNOSIS — C069 Malignant neoplasm of mouth, unspecified: Secondary | ICD-10-CM

## 2021-12-03 DIAGNOSIS — C411 Malignant neoplasm of mandible: Secondary | ICD-10-CM | POA: Diagnosis present

## 2021-12-03 LAB — TSH: TSH: 1.59 u[IU]/mL (ref 0.350–4.500)

## 2021-12-06 ENCOUNTER — Inpatient Hospital Stay: Payer: Non-veteran care

## 2021-12-07 ENCOUNTER — Encounter (HOSPITAL_COMMUNITY): Payer: Self-pay

## 2021-12-07 ENCOUNTER — Observation Stay (HOSPITAL_COMMUNITY)
Admission: EM | Admit: 2021-12-07 | Discharge: 2021-12-09 | Discharge status: Against Medical Advice | Payer: No Typology Code available for payment source | Attending: Internal Medicine | Admitting: Internal Medicine

## 2021-12-07 ENCOUNTER — Emergency Department (HOSPITAL_COMMUNITY): Payer: No Typology Code available for payment source

## 2021-12-07 ENCOUNTER — Other Ambulatory Visit: Payer: Self-pay

## 2021-12-07 DIAGNOSIS — Z8673 Personal history of transient ischemic attack (TIA), and cerebral infarction without residual deficits: Secondary | ICD-10-CM | POA: Diagnosis not present

## 2021-12-07 DIAGNOSIS — Z7982 Long term (current) use of aspirin: Secondary | ICD-10-CM | POA: Insufficient documentation

## 2021-12-07 DIAGNOSIS — Z79899 Other long term (current) drug therapy: Secondary | ICD-10-CM | POA: Insufficient documentation

## 2021-12-07 DIAGNOSIS — Z89421 Acquired absence of other right toe(s): Secondary | ICD-10-CM | POA: Insufficient documentation

## 2021-12-07 DIAGNOSIS — R531 Weakness: Secondary | ICD-10-CM | POA: Diagnosis not present

## 2021-12-07 DIAGNOSIS — R5383 Other fatigue: Secondary | ICD-10-CM | POA: Insufficient documentation

## 2021-12-07 DIAGNOSIS — Z85830 Personal history of malignant neoplasm of bone: Secondary | ICD-10-CM | POA: Insufficient documentation

## 2021-12-07 DIAGNOSIS — E1122 Type 2 diabetes mellitus with diabetic chronic kidney disease: Secondary | ICD-10-CM | POA: Diagnosis not present

## 2021-12-07 DIAGNOSIS — I130 Hypertensive heart and chronic kidney disease with heart failure and stage 1 through stage 4 chronic kidney disease, or unspecified chronic kidney disease: Secondary | ICD-10-CM | POA: Diagnosis not present

## 2021-12-07 DIAGNOSIS — D72829 Elevated white blood cell count, unspecified: Secondary | ICD-10-CM | POA: Diagnosis not present

## 2021-12-07 DIAGNOSIS — Z87891 Personal history of nicotine dependence: Secondary | ICD-10-CM | POA: Diagnosis not present

## 2021-12-07 DIAGNOSIS — J45909 Unspecified asthma, uncomplicated: Secondary | ICD-10-CM | POA: Diagnosis not present

## 2021-12-07 DIAGNOSIS — N184 Chronic kidney disease, stage 4 (severe): Secondary | ICD-10-CM | POA: Diagnosis not present

## 2021-12-07 DIAGNOSIS — I509 Heart failure, unspecified: Secondary | ICD-10-CM | POA: Diagnosis not present

## 2021-12-07 DIAGNOSIS — N179 Acute kidney failure, unspecified: Secondary | ICD-10-CM | POA: Diagnosis not present

## 2021-12-07 DIAGNOSIS — Z794 Long term (current) use of insulin: Secondary | ICD-10-CM | POA: Insufficient documentation

## 2021-12-07 DIAGNOSIS — K9422 Gastrostomy infection: Secondary | ICD-10-CM | POA: Diagnosis present

## 2021-12-07 LAB — BASIC METABOLIC PANEL
Anion gap: 11 (ref 5–15)
BUN: 135 mg/dL — ABNORMAL HIGH (ref 8–23)
CO2: 30 mmol/L (ref 22–32)
Calcium: 9.7 mg/dL (ref 8.9–10.3)
Chloride: 99 mmol/L (ref 98–111)
Creatinine, Ser: 4.11 mg/dL — ABNORMAL HIGH (ref 0.61–1.24)
GFR, Estimated: 15 mL/min — ABNORMAL LOW (ref >60)
Glucose, Bld: 122 mg/dL — ABNORMAL HIGH (ref 70–99)
Potassium: 3.5 mmol/L (ref 3.5–5.1)
Sodium: 140 mmol/L (ref 135–145)

## 2021-12-07 LAB — CBC WITH DIFFERENTIAL/PLATELET
Abs Immature Granulocytes: 0.04 10*3/uL (ref 0.00–0.07)
Basophils Absolute: 0 10*3/uL (ref 0.0–0.1)
Basophils Relative: 0 %
Eosinophils Absolute: 1.8 10*3/uL — ABNORMAL HIGH (ref 0.0–0.5)
Eosinophils Relative: 11 %
HCT: 31.1 % — ABNORMAL LOW (ref 39.0–52.0)
Hemoglobin: 9.8 g/dL — ABNORMAL LOW (ref 13.0–17.0)
Immature Granulocytes: 0 %
Lymphocytes Relative: 29 %
Lymphs Abs: 4.9 10*3/uL — ABNORMAL HIGH (ref 0.7–4.0)
MCH: 32.2 pg (ref 26.0–34.0)
MCHC: 31.5 g/dL (ref 30.0–36.0)
MCV: 102.3 fL — ABNORMAL HIGH (ref 80.0–100.0)
Monocytes Absolute: 1.8 10*3/uL — ABNORMAL HIGH (ref 0.1–1.0)
Monocytes Relative: 11 %
Neutro Abs: 8.4 10*3/uL — ABNORMAL HIGH (ref 1.7–7.7)
Neutrophils Relative %: 49 %
Platelets: 209 10*3/uL (ref 150–400)
RBC: 3.04 MIL/uL — ABNORMAL LOW (ref 4.22–5.81)
RDW: 14.9 % (ref 11.5–15.5)
WBC: 16.9 10*3/uL — ABNORMAL HIGH (ref 4.0–10.5)
nRBC: 0 % (ref 0.0–0.2)

## 2021-12-07 NOTE — ED Provider Notes (Signed)
[facility] Provider Note   CSN: [id number] Arrival date & time: 12/07/21  2040     History  Chief Complaint  Patient presents with   G Tube issue    John A Barton Want. is a 66 y.o. 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  He has a past medical history of stroke with left-sided residual weakness and chronic renal insufficiency..  Patient also has a G-tube in place.  He is currently at [facility].  He complains of increased malaise and fatigue today along with increased swelling in his mouth and throat which is new.  He feels like he is having to clear his throat more and having difficulty swallowing.  His speech and language pathologist was concerned that he may have oral thrush due to white patches in his throat.  He denies any pain in his mouth.  They were also concerned because of discharge and foul odor surrounding his G-tube.  He denies any pain at that site.  They state that they may have had some difficulty pushing meds earlier however he was able to receive pain meds through his G-tube today.  His nurse did not give him a feeding this evening because of concern of infection at the G-tube site.  He has not had any fevers.  The history is provided by the patient and medical records (a family member).       Home Medications Prior to Admission medications   Medication Sig Start Date End Date Taking? Authorizing Provider  acetaminophen (TYLENOL) 500 MG tablet Take 1,000 mg by mouth 2 (two) times daily as needed (for pain or headaches).   Yes [provider]  Amino Acids-Protein Hydrolys (FEEDING SUPPLEMENT, PRO-STAT SUGAR FREE 64,) LIQD Take 30 mLs by mouth 2 (two) times daily.   Yes [provider]  amoxicillin-clavulanate (AUGMENTIN) 250-62.5 MG/5ML suspension Take 10 mLs (500 mg total) by mouth 2 (two) times daily for 10  days. 12/09/21 12/19/21 Yes Barb Merino, MD  aspirin 81 MG EC tablet Take 81 mg by mouth daily. 05/20/21  Yes [provider]  atorvastatin (LIPITOR) 40 MG tablet Take 40 mg by mouth daily. 05/20/21  Yes [provider]  carvedilol (COREG) 25 MG tablet Take 25 mg by mouth every 12 (twelve) hours.   Yes [provider]  chlorhexidine (PERIDEX) 0.12 % solution Use as directed 15 mLs in the mouth or throat 3 (three) times daily.   Yes [provider]  ferrous sulfate 220 (44 Fe) MG/5ML solution Place 300 mg into feeding tube daily.   Yes [provider]  gabapentin (NEURONTIN) 300 MG capsule Place 300 mg into feeding tube at bedtime.   Yes [provider]  Glucose (RA TRUEPLUS GLUCOSE) 15 GM/32ML GEL Give 15 g by tube daily as needed. Low Glucose 11/19/21  Yes [provider]  hydrALAZINE (APRESOLINE) 100 MG tablet Take 1 tablet (100 mg total) by mouth 3 (three) times daily. 11/11/16  Yes Angiulli, Lavon Paganini, PA-C  Insulin Glargine (BASAGLAR KWIKPEN) 100 UNIT/ML Inject 8 Units into the skin daily.   Yes [provider]  insulin lispro (HUMALOG) 100 UNIT/ML injection Inject 1-8 Units into the skin 5 (five) times daily. Sliding Scale: 70-120= 0 units, 121-150= 1 unit,151-200= 2 units, 201-250= 3 units, 251-300= 5 units,301-350= 7 units, 351-400= 8 units, > 400 = 8 units Call MD.   Yes [provider]  isosorbide dinitrate (ISORDIL) 20 MG tablet Take 20 mg by mouth 3 (three) times daily.   Yes [provider]  Lactobacillus-Inulin (CULTURELLE ADULT ULT BALANCE PO) 1 capsule by PEG Tube route daily.   Yes [provider]  melatonin 3 MG TABS tablet Take 6 mg by mouth at bedtime as needed for sleep. 11/19/21  Yes [provider]  Nutritional Supplements (FEEDING SUPPLEMENT, NEPRO CARB STEADY,) LIQD 237 mLs 5 (five) times daily.   Yes [provider]  oxyCODONE (OXY IR/ROXICODONE) 5 MG immediate release  tablet Place 5 mg into feeding tube every 6 (six) hours as needed for severe pain.   Yes [provider]  pantoprazole sodium (PROTONIX) 40 mg/20 mL SUSP Place 40 mg into feeding tube daily.   Yes [provider]  polyethylene glycol (MIRALAX / GLYCOLAX) 17 g packet Take 17 g by mouth daily as needed for moderate constipation.   Yes [provider]  senna-docusate (SENOKOT-S) 8.6-50 MG tablet Place 2 tablets into feeding tube at bedtime as needed for mild constipation.   Yes [provider]  terazosin (HYTRIN) 2 MG capsule Take 2 mg by mouth daily.   Yes [provider]  Water For Irrigation, Sterile (FREE WATER) SOLN Place 200 mLs into feeding tube every 4 (four) hours. 12/09/21   Barb Merino, MD      Allergies    Amlodipine besy-benazepril hcl and Shellfish allergy    Review of Systems   Review of Systems  Physical Exam Updated Vital Signs BP (!) 143/68 (BP Location: Left Arm)   Pulse 78   Temp 98 F (36.7 C) (Oral)   Resp 16   Ht '5\' 7"'$  (1.702 m)   Wt 73.1 kg   SpO2 99%   BMI 25.24 kg/m  Physical Exam Vitals and nursing note reviewed.  Constitutional:      General: He is not in acute distress.    Appearance: He is well-developed. He is not diaphoretic.  HENT:     Head: Normocephalic.     Mouth/Throat:     Comments: Significant swelling noted along the mandible and throat.  There is a large skin graft in the hypoglossal region, this is significantly swollen, nontender with stitches in place.  There are thick white patches noted in various areas on the tongue and throat that are easily removed.  Patient is unable to swallow which is normal status post surgery. Eyes:     General: No scleral icterus.    Conjunctiva/sclera: Conjunctivae normal.  Cardiovascular:     Rate and Rhythm: Normal rate and regular rhythm.     Heart sounds: Normal heart sounds.  Pulmonary:     Effort: Pulmonary effort is normal. No respiratory distress.      Breath sounds: Normal breath sounds.  Abdominal:     Palpations: Abdomen is soft.     Tenderness: There is no abdominal tenderness.     Comments: Patient has a G-tube in the left upper quadrant of the abdomen.  There is some discharge that is dried around the G-tube site.  There is some Renee limitus tissue noted on the medial side of the ostomy site.  It is nontender, nonerythematous, no swelling, no induration or other signs of infection.  Musculoskeletal:     Cervical back: Normal range of motion and neck supple.  Skin:    General: Skin is warm and dry.  Neurological:     Mental Status: He is alert.  Psychiatric:  Behavior: Behavior normal.     ED Results / Procedures / Treatments   Labs (all labs ordered are listed, but only abnormal results are displayed) Labs Reviewed  BASIC METABOLIC PANEL - Abnormal; Notable for the following components:      Result Value   Glucose, Bld 122 (*)    BUN 135 (*)    Creatinine, Ser 4.11 (*)    GFR, Estimated 15 (*)    All other components within normal limits  CBC WITH DIFFERENTIAL/PLATELET - Abnormal; Notable for the following components:   WBC 16.9 (*)    RBC 3.04 (*)    Hemoglobin 9.8 (*)    HCT 31.1 (*)    MCV 102.3 (*)    Neutro Abs 8.4 (*)    Lymphs Abs 4.9 (*)    Monocytes Absolute 1.8 (*)    Eosinophils Absolute 1.8 (*)    All other components within normal limits  URINALYSIS, ROUTINE W REFLEX MICROSCOPIC - Abnormal; Notable for the following components:   APPearance HAZY (*)    Leukocytes,Ua SMALL (*)    Bacteria, UA RARE (*)    All other components within normal limits  BASIC METABOLIC PANEL - Abnormal; Notable for the following components:   Potassium 3.4 (*)    BUN 122 (*)    Creatinine, Ser 4.08 (*)    GFR, Estimated 15 (*)    All other components within normal limits  BASIC METABOLIC PANEL - Abnormal; Notable for the following components:   Potassium 3.3 (*)    Glucose, Bld 192 (*)    BUN 112 (*)     Creatinine, Ser 4.28 (*)    GFR, Estimated 14 (*)    All other components within normal limits  GLUCOSE, CAPILLARY - Abnormal; Notable for the following components:   Glucose-Capillary 204 (*)    All other components within normal limits  GLUCOSE, CAPILLARY - Abnormal; Notable for the following components:   Glucose-Capillary 177 (*)    All other components within normal limits  COMPREHENSIVE METABOLIC PANEL - Abnormal; Notable for the following components:   Potassium 3.2 (*)    Glucose, Bld 168 (*)    BUN 113 (*)    Creatinine, Ser 4.00 (*)    Total Protein 6.4 (*)    Albumin 2.6 (*)    GFR, Estimated 16 (*)    All other components within normal limits  CBC - Abnormal; Notable for the following components:   WBC 18.3 (*)    RBC 2.72 (*)    Hemoglobin 8.9 (*)    HCT 28.1 (*)    MCV 103.3 (*)    All other components within normal limits  GLUCOSE, CAPILLARY - Abnormal; Notable for the following components:   Glucose-Capillary 149 (*)    All other components within normal limits  GLUCOSE, CAPILLARY - Abnormal; Notable for the following components:   Glucose-Capillary 186 (*)    All other components within normal limits  GLUCOSE, CAPILLARY - Abnormal; Notable for the following components:   Glucose-Capillary 180 (*)    All other components within normal limits  GLUCOSE, CAPILLARY - Abnormal; Notable for the following components:   Glucose-Capillary 158 (*)    All other components within normal limits  CBG MONITORING, ED - Abnormal; Notable for the following components:   Glucose-Capillary 135 (*)    All other components within normal limits  MAGNESIUM  PROCALCITONIN    EKG None  Radiology US RENAL  Result Date: 12/08/2021 CLINICAL DATA:  Acute renal  failure EXAM: RENAL / URINARY TRACT ULTRASOUND COMPLETE COMPARISON:  None Available. FINDINGS: Right Kidney: Renal measurements: 10.6 x 5.4 x 7.2 cm = volume: 116.4 mL. No hydronephrosis. Increased renal cortical  echogenicity. Left Kidney: Renal measurements: 10.9 x 6.7 x 6.1 cm = volume: 129.8 mL. No hydronephrosis. Increased renal cortical echogenicity. Bladder: Mild dependent debris in the posterior bladder. Bilateral ureteral jets are seen. The bladder is well distended. Other: None. IMPRESSION: No hydronephrosis. Increased renal echogenicity as can be seen in medical renal disease. Mild dependent debris in the posterior bladder. Correlate with urinalysis. Electronically Signed   By: Maurine Simmering M.D.   On: 12/08/2021 16:35   DG CHEST PORT 1 VIEW  Result Date: 12/08/2021 CLINICAL DATA:  Cough with history of mandibular cancer. EXAM: PORTABLE CHEST 1 VIEW COMPARISON:  Portable chest 02/11/2018. FINDINGS: The patient's neck soft tissues minimally obscure the medial apices. There is mild-to-moderate cardiomegaly. The lungs expiratory limiting the study. Mediastinum appears stable with calcifications in the aortic arch. There is prominence of the central vessels which could be due to expiration or mild perihilar vascular congestion. No pleural effusion is seen. There is increased opacity in the hypoinflated bases left-greater-than-right which could be atelectasis, pneumonia or aspiration. No acute osseous abnormality is seen. No pneumothorax. Mild thoracic dextroscoliosis. IMPRESSION: 1. Limited expiratory study. 2. Left-greater-than-right basilar opacities which could be atelectasis or pneumonia. 3. Central vascular prominence which could be due to technique or mild perihilar vascular congestion. No overt edema. 4. Follow-up PA and lateral views recommended in full inspiration. Electronically Signed   By: Telford Nab M.D.   On: 12/08/2021 02:34   CT Soft Tissue Neck Wo Contrast  Result Date: 12/08/2021 CLINICAL DATA:  Initial evaluation for soft tissue swelling, infection suspected. EXAM: CT NECK WITHOUT CONTRAST TECHNIQUE: Multidetector CT imaging of the neck was performed following the standard protocol without  intravenous contrast. RADIATION DOSE REDUCTION: This exam was performed according to the departmental dose-optimization program which includes automated exposure control, adjustment of the mA and/or kV according to patient size and/or use of iterative reconstruction technique. COMPARISON:  Prior exams from 11/24/2021 as well as earlier studies. FINDINGS: Pharynx and larynx: Postoperative changes from prior left hemimandibulectomy with flap/fat graft placement, and bilateral nodal dissection. Diffuse soft tissue density seen about the reconstructed mandible as well as the adjacent left submandibular space and submental region. Overlying skin thickening with stranding in the subcutaneous fat, which could reflect post treatment changes. No visible collections evident on this noncontrast examination. No appreciable or residual tumor or mass. Oropharynx and nasopharynx within normal limits. Trace retropharyngeal effusion without loculated collection. Negative epiglottis. Small amount of layering secretions noted within the hypopharynx. Remainder of the supraglottic larynx and glottis are within normal limits. Subglottic airway patent clear. Salivary glands: Right parotid gland within normal limits. Right submandibular gland likely remains and is grossly unremarkable. The left parotid and submandibular glands appear to have been resected. Thyroid: No significant finding. Lymph nodes: Sequelae of prior bilateral nodal dissection seen within the neck. No visible enlarged or pathologic adenopathy. Mildly prominent nodes at the left supraclavicular fossa measure up to 1 cm in short axis, similar to previous, and indeterminate. Vascular: Scattered atheromatous change about the carotid bifurcations and visualized carotid siphons. Limited intracranial: Unremarkable. Visualized orbits: Prior bilateral ocular lens replacement. Otherwise unremarkable. Mastoids and visualized paranasal sinuses: Mild scattered mucoperiosteal  thickening present about the ethmoidal air cells and maxillary sinuses. Visualized paranasal sinuses are otherwise clear. Trace right mastoid effusion noted.  Left mastoid air cells are clear. Skeleton: No worrisome osseous lesions. Subcentimeter sclerotic lesion within the T3 vertebral body noted, stable from prior, likely a small benign bone island. Moderate spondylosis present at C3-4 through C6-7. Upper chest: Visualized upper chest demonstrates no acute finding. Partially visualized lungs are largely clear. Sequelae of prior left axillary nodal dissection noted as well. Other: None. IMPRESSION: 1. Postoperative changes from prior left hemimandibulectomy with flap/fat graft placement, and bilateral nodal dissection. 2. Diffuse soft tissue density about the reconstructed mandible as well as the adjacent left submandibular space and submental region with overlying skin thickening and stranding. Findings felt to be most consistent with post treatment changes. No visible residual or recurrent tumor. No discrete or loculated collections on this noncontrast examination. 3. No enlarged or pathologic adenopathy within the neck. Mildly prominent nodes at the left supraclavicular fossa measure up to 1 cm in short axis, similar to previous, and indeterminate. Attention at follow-up recommended. 4. No other acute abnormality. Electronically Signed   By: Jeannine Boga M.D.   On: 12/08/2021 01:30    Procedures Procedures    Medications Ordered in ED Medications  sodium chloride 0.9 % bolus 1,000 mL (0 mLs Intravenous Stopped 12/08/21 0402)  Ampicillin-Sulbactam (UNASYN) 3 g in sodium chloride 0.9 % 100 mL IVPB (0 g Intravenous Stopped 12/08/21 0402)  sodium chloride 0.9 % bolus 1,500 mL (0 mLs Intravenous Stopped 12/08/21 1900)    ED Course/ Medical Decision Making/ A&P Clinical Course as of 12/09/21 1853  Wed Dec 08, 2021  0232 BUN(!): 135 [AH]  0234 Case discussed with Dr. Nicola Girt of ENT at [place] getting a chest x-ray and a urine since his white blood cell count is elevated.  Of note the patient does appear to have an increase in his BUN and creatinine however from previous values at [facility] nearly doubling his creatinine.  We will give fluids and reassess.  Patient has a follow-up appointment tomorrow with ENT. [AH]  0235 CT Soft Tissue Neck Wo Contrast [AH]  0309 DG CHEST PORT 1 VIEW [AH]  0520 BMP shows little improvement in his BUN (135-122) after a liter of fluids. Pt follows with [facility]. I discussed findings with Dr. Candiss Norse of nephrology who recommends bringing the patient into the hospital for fluid resuscitation and nephrology consult.   [AH]  0610 Case discussed with Dr Marlowe Sax who will admit the patient [AH]    Clinical Course User Index [AH] Margarita Mail, PA-C                           Medical Decision Making 66 year old male with a complicated past medical history who initially presented for evaluation of white plaques in his tongue, possible G-tube infection, and increasing weakness and malaise from skilled nursing facility. This patient presents to the ED for concern of fatigue and weakness, this involves an extensive number of treatment options, and is a complaint that carries with it a high risk of complications and morbidity.  The differential diagnosis includes The differential diagnosis of weakness includes but is not limited to neurologic causes (GBS, myasthenia gravis, CVA, MS, ALS, transverse myelitis, spinal cord injury, CVA, botulism, ) and other causes: ACS, Arrhythmia, syncope, orthostatic hypotension, sepsis, hypoglycemia, electrolyte disturbance, hypothyroidism, respiratory failure, symptomatic anemia, dehydration, heat injury, polypharmacy, malignancy.  After review of all data points in the case the patient does not appear to have increasing infection in recent graft surgery or  infection in his G-tube site.  He does appear to have  worsening renal function with significantly elevated BUN.  The patient is not confused but this is likely the underlying cause of his significantly increased weakness and fatigue.  Furthermore patient appears to have pneumonia on his chest x-ray suggestive of aspiration.  Patient covered here with IV Unasyn.  This is likely also contributing to his increased weakness and fatigue.  Although clinically patient is not febrile or coughing.  Co morbidities that complicate the patient evaluation       Recent complicated mandibular surgery with resection, PEG tube feedings, n.p.o., underlying renal insufficiency    Additional history obtained:  Additional history obtained from daughter at bedside External records from outside source obtained and reviewed including labs, imaging, hospital summary, consult notes.   Lab Tests:  I Ordered, and personally interpreted labs.  The pertinent results include:      Imaging Studies ordered:  I ordered imaging studies including CT imaging of soft tissues of the neck without contrast and a chest x-ray I independently visualized and interpreted imaging which showed inflammatory stranding secondary to surgery not significantly concerning for infection and potential atelectasis versus pneumonia respectively I agree with the radiologist interpretation   Cardiac Monitoring:       The patient was maintained on a cardiac monitor.  I personally viewed and interpreted the cardiac monitored which showed an underlying rhythm of: Normal sinus rhythm   Medicines ordered and prescription drug management:  I ordered medication including fluids, Unasyn for dehydration and aspiration pneumonia Reevaluation of the patient after these medicines showed that the patient improved I have reviewed the patients home medicines and have made adjustments as needed   Test Considered:       I considered CT imaging with contrast however given patient's rearrest however rest  however given patient's renal insufficiency I have I avoided this   Critical Interventions:     fluid resuscitation/antibiotics   Consultations Obtained:  I requested consultation with the ENT at [facility], nephrology nephrology at [facility],  and discussed lab and imaging findings as well as pertinent plan - they recommend: Assessment for leukocytosis with chest x-ray and urine which shows potential aspiration pneumonia, and admission for worsening renal function   Problem List / ED Course:       Aspiration pneumonia, kidney failure   Reevaluation:  After the interventions noted above, I reevaluated the patient and found that they have :improved      Dispostion:  After consideration of the diagnostic results and the patients response to treatment, I feel that the patent would benefit from admission.    Amount and/or Complexity of Data Reviewed External Data Reviewed: labs, radiology and notes. Labs: ordered. Decision-making details documented in ED Course. Radiology: ordered and independent interpretation performed. Decision-making details documented in ED Course.  Risk Decision regarding hospitalization.           Final Clinical Impression(s) / ED Diagnoses Final diagnoses:  None    Rx / DC Orders ED Discharge Orders          Ordered    Water For Irrigation, Sterile (FREE WATER) SOLN  Every 4 hours        12/09/21 0847    amoxicillin-clavulanate (AUGMENTIN) 250-62.5 MG/5ML suspension  2 times daily        12/09/21 0847    Increase activity slowly        12/09/21 0847    No wound care  12/09/21 0847    Discharge instructions       Comments: Some changes in medications  Repeat blood tests next 2-3 days   12/09/21 0847    Call MD for:  temperature >100.4        12/09/21 0847    Call MD for:  difficulty breathing, headache or visual disturbances        12/09/21 0847    Call MD for:  extreme fatigue        12/09/21 0847               Margarita Mail, PA-C 12/09/21 1853    Veryl Speak, MD 12/10/21 0500

## 2021-12-08 ENCOUNTER — Encounter (HOSPITAL_COMMUNITY): Payer: Self-pay | Admitting: Internal Medicine

## 2021-12-08 ENCOUNTER — Observation Stay (HOSPITAL_COMMUNITY): Payer: No Typology Code available for payment source

## 2021-12-08 ENCOUNTER — Emergency Department (HOSPITAL_COMMUNITY): Payer: No Typology Code available for payment source

## 2021-12-08 DIAGNOSIS — N179 Acute kidney failure, unspecified: Secondary | ICD-10-CM | POA: Diagnosis not present

## 2021-12-08 DIAGNOSIS — R5383 Other fatigue: Secondary | ICD-10-CM | POA: Diagnosis present

## 2021-12-08 LAB — BASIC METABOLIC PANEL
Anion gap: 12 (ref 5–15)
Anion gap: 8 (ref 5–15)
BUN: 122 mg/dL — ABNORMAL HIGH (ref 8–23)
BUN: 112 mg/dL — ABNORMAL HIGH (ref 8–23)
CO2: 28 mmol/L (ref 22–32)
CO2: 29 mmol/L (ref 22–32)
Calcium: 9.1 mg/dL (ref 8.9–10.3)
Calcium: 9.1 mg/dL (ref 8.9–10.3)
Chloride: 101 mmol/L (ref 98–111)
Chloride: 102 mmol/L (ref 98–111)
Creatinine, Ser: 4.08 mg/dL — ABNORMAL HIGH (ref 0.61–1.24)
Creatinine, Ser: 4.28 mg/dL — ABNORMAL HIGH (ref 0.61–1.24)
GFR, Estimated: 15 mL/min — ABNORMAL LOW (ref >60)
GFR, Estimated: 14 mL/min — ABNORMAL LOW (ref >60)
Glucose, Bld: 97 mg/dL (ref 70–99)
Glucose, Bld: 192 mg/dL — ABNORMAL HIGH (ref 70–99)
Potassium: 3.4 mmol/L — ABNORMAL LOW (ref 3.5–5.1)
Potassium: 3.3 mmol/L — ABNORMAL LOW (ref 3.5–5.1)
Sodium: 141 mmol/L (ref 135–145)
Sodium: 139 mmol/L (ref 135–145)

## 2021-12-08 LAB — URINALYSIS, ROUTINE W REFLEX MICROSCOPIC
Bilirubin Urine: NEGATIVE
Glucose, UA: NEGATIVE mg/dL
Hgb urine dipstick: NEGATIVE
Ketones, ur: NEGATIVE mg/dL
Nitrite: NEGATIVE
Protein, ur: NEGATIVE mg/dL
Specific Gravity, Urine: 1.013 (ref 1.005–1.030)
pH: 5 (ref 5.0–8.0)

## 2021-12-08 LAB — CBG MONITORING, ED: Glucose-Capillary: 135 mg/dL — ABNORMAL HIGH (ref 70–99)

## 2021-12-08 LAB — GLUCOSE, CAPILLARY
Glucose-Capillary: 149 mg/dL — ABNORMAL HIGH (ref 70–99)
Glucose-Capillary: 177 mg/dL — ABNORMAL HIGH (ref 70–99)
Glucose-Capillary: 186 mg/dL — ABNORMAL HIGH (ref 70–99)
Glucose-Capillary: 204 mg/dL — ABNORMAL HIGH (ref 70–99)

## 2021-12-08 LAB — MAGNESIUM: Magnesium: 2 mg/dL (ref 1.7–2.4)

## 2021-12-08 MED ORDER — ASPIRIN 81 MG PO TBEC
81.0000 mg | DELAYED_RELEASE_TABLET | Freq: Every day | ORAL | Status: DC
  Administered 2021-12-08: 81 mg via ORAL
  Filled 2021-12-08: qty 1

## 2021-12-08 MED ORDER — PROSOURCE TF PO LIQD
45.0000 mL | Freq: Two times a day (BID) | ORAL | Status: DC
  Administered 2021-12-08: 45 mL
  Filled 2021-12-08 (×2): qty 45

## 2021-12-08 MED ORDER — SODIUM CHLORIDE 0.9 % IV SOLN
3.0000 g | Freq: Two times a day (BID) | INTRAVENOUS | Status: DC
  Administered 2021-12-08 – 2021-12-09 (×2): 3 g via INTRAVENOUS
  Filled 2021-12-08 (×3): qty 8

## 2021-12-08 MED ORDER — CHLORHEXIDINE GLUCONATE 0.12 % MT SOLN
5.0000 mL | Freq: Four times a day (QID) | OROMUCOSAL | Status: DC
  Administered 2021-12-08 (×3): 5 mL via OROMUCOSAL
  Filled 2021-12-08 (×3): qty 15

## 2021-12-08 MED ORDER — ENOXAPARIN SODIUM 30 MG/0.3ML IJ SOSY
30.0000 mg | PREFILLED_SYRINGE | INTRAMUSCULAR | Status: DC
  Administered 2021-12-08: 30 mg via SUBCUTANEOUS
  Filled 2021-12-08: qty 0.3

## 2021-12-08 MED ORDER — ACETAMINOPHEN 500 MG PO TABS: 1000.0000 mg | ORAL_TABLET | Freq: Two times a day (BID) | ORAL | Status: DC | PRN

## 2021-12-08 MED ORDER — ISOSORBIDE DINITRATE 20 MG PO TABS
20.0000 mg | ORAL_TABLET | Freq: Three times a day (TID) | ORAL | Status: DC
  Administered 2021-12-08 (×2): 20 mg
  Filled 2021-12-08 (×4): qty 1

## 2021-12-08 MED ORDER — INSULIN ASPART 100 UNIT/ML IJ SOLN
1.0000 [IU] | Freq: Three times a day (TID) | INTRAMUSCULAR | Status: DC
  Administered 2021-12-08: 2 [IU] via SUBCUTANEOUS
  Filled 2021-12-08: qty 0.08

## 2021-12-08 MED ORDER — INSULIN GLARGINE-YFGN 100 UNIT/ML ~~LOC~~ SOLN
4.0000 [IU] | Freq: Every day | SUBCUTANEOUS | Status: DC
  Administered 2021-12-08: 4 [IU] via SUBCUTANEOUS
  Filled 2021-12-08 (×2): qty 0.04

## 2021-12-08 MED ORDER — TERAZOSIN HCL 2 MG PO CAPS: 2.0000 mg | ORAL_CAPSULE | Freq: Every day | ORAL | Status: DC

## 2021-12-08 MED ORDER — SENNOSIDES-DOCUSATE SODIUM 8.6-50 MG PO TABS: 1.0000 | ORAL_TABLET | Freq: Every evening | ORAL | Status: DC | PRN

## 2021-12-08 MED ORDER — NEPRO/CARBSTEADY PO LIQD
237.0000 mL | Freq: Every day | ORAL | Status: DC
Start: 2021-12-08 — End: 2021-12-09
  Administered 2021-12-08 – 2021-12-09 (×5): 237 mL
  Filled 2021-12-08 (×9): qty 237

## 2021-12-08 MED ORDER — GABAPENTIN 250 MG/5ML PO SOLN
300.0000 mg | Freq: Every day | ORAL | Status: DC
  Administered 2021-12-08: 300 mg
  Filled 2021-12-08: qty 6

## 2021-12-08 MED ORDER — FREE WATER
100.0000 mL | Status: DC
Start: 2021-12-08 — End: 2021-12-08
  Administered 2021-12-08: 100 mL

## 2021-12-08 MED ORDER — LIP MEDEX EX OINT
TOPICAL_OINTMENT | CUTANEOUS | Status: DC | PRN
  Filled 2021-12-08: qty 7

## 2021-12-08 MED ORDER — SODIUM CHLORIDE 0.45 % IV SOLN: INTRAVENOUS | Status: DC

## 2021-12-08 MED ORDER — PANCRELIPASE (LIP-PROT-AMYL) 10440-39150 UNITS PO TABS
20880.0000 [IU] | ORAL_TABLET | Freq: Once | ORAL | Status: DC
  Filled 2021-12-08: qty 2

## 2021-12-08 MED ORDER — OXYCODONE HCL 5 MG PO TABS
5.0000 mg | ORAL_TABLET | Freq: Four times a day (QID) | ORAL | Status: DC | PRN
  Administered 2021-12-08 (×2): 5 mg
  Filled 2021-12-08 (×2): qty 1

## 2021-12-08 MED ORDER — NEPRO/CARBSTEADY PO LIQD: 237.0000 mL | ORAL | Status: DC

## 2021-12-08 MED ORDER — OXYCODONE HCL 5 MG PO TABS: 5.0000 mg | ORAL_TABLET | Freq: Four times a day (QID) | ORAL | Status: DC | PRN

## 2021-12-08 MED ORDER — SODIUM CHLORIDE 0.9 % IV BOLUS: 250.0000 mL | Freq: Once | INTRAVENOUS | Status: DC

## 2021-12-08 MED ORDER — SODIUM CHLORIDE 0.9 % IV BOLUS
1500.0000 mL | Freq: Once | INTRAVENOUS | Status: AC
Start: 2021-12-08 — End: 2021-12-08
  Administered 2021-12-08: 1500 mL via INTRAVENOUS

## 2021-12-08 MED ORDER — NEPRO/CARBSTEADY PO LIQD: 237.0000 mL | Freq: Every day | ORAL | Status: DC

## 2021-12-08 MED ORDER — SODIUM BICARBONATE 650 MG PO TABS: 650.0000 mg | ORAL_TABLET | Freq: Once | ORAL | Status: DC

## 2021-12-08 MED ORDER — FREE WATER
200.0000 mL | Status: DC
  Administered 2021-12-08 – 2021-12-09 (×5): 200 mL

## 2021-12-08 MED ORDER — HYDRALAZINE HCL 50 MG PO TABS
100.0000 mg | ORAL_TABLET | Freq: Three times a day (TID) | ORAL | Status: DC
  Administered 2021-12-08 (×2): 100 mg via ORAL
  Filled 2021-12-08 (×2): qty 2

## 2021-12-08 MED ORDER — TERAZOSIN HCL 2 MG PO CAPS
2.0000 mg | ORAL_CAPSULE | Freq: Every day | ORAL | Status: DC
  Administered 2021-12-08: 2 mg
  Filled 2021-12-08: qty 1

## 2021-12-08 MED ORDER — SODIUM CHLORIDE 0.9 % IV BOLUS
1000.0000 mL | Freq: Once | INTRAVENOUS | Status: AC
  Administered 2021-12-08: 1000 mL via INTRAVENOUS

## 2021-12-08 MED ORDER — SODIUM CHLORIDE 0.9 % IV SOLN
3.0000 g | Freq: Once | INTRAVENOUS | Status: AC
Start: 2021-12-08 — End: 2021-12-08
  Administered 2021-12-08: 3 g via INTRAVENOUS
  Filled 2021-12-08: qty 8

## 2021-12-08 MED ORDER — PANTOPRAZOLE 2 MG/ML SUSPENSION
40.0000 mg | Freq: Every day | ORAL | Status: DC
  Administered 2021-12-08: 40 mg
  Filled 2021-12-08 (×2): qty 20

## 2021-12-08 NOTE — Consult Note (Addendum)
Renal Service Consult Note [facility]  Hilbert Charmayne Sheer Sr. 12/08/2021 Sol Blazing, MD Requesting Physician: Dr. Posey Pronto  Reason for Consult: Renal failure HPI: The patient is a 66 y.o. year-old w/ hx of anemia, CKD, DM2, hep C, HL, HTN, RBBB, CVA who presented to ED from SNF for progressive gen'd weakness and drainage around his PEG tube. Pt has hx of CVA w/ left-sided weakness and CKD. In ED labs showed Bun 135 and creat 4.11. In ED got 1 L bolus , IV unasyn. Baseline creat is around 2.5. We are asked to see for renal failure.   Pt seen in room, pt defers most of history to his daughter. She describes a few days of progressive weakness, loss of energy and some drainage around his PEG tube getting worse. 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  Also pt has CKD f/b Dr Graylon Gunning at [facility] for last 5 yrs. Next appt is this Friday.    Daughter also says that he had his cancer surgery recently (removed bone from the L back/ scapula and removed cancer from his jaw then used the removed bone used to fill it in). His f/u appt w/ the surgeons who did this surgery and she said that she is planning to "take him to his appt tomorrow at 10 am" whether or not he is actually dc'd tomorrow am.   ROS - denies CP, no joint pain, no HA, no blurry vision, no rash, no diarrhea, no nausea/ vomiting, no dysuria, no difficulty voiding   Past Medical History  Past Medical History:  Diagnosis Date   Anemia    Asthma    Chronic kidney disease    Chronic lower back pain 1995   Lumbar back surgery   Diabetic retinopathy ([place])    PDR OU   DM (diabetes mellitus) type II controlled with renal manifestation ([place])    CKD-4, peripheral neuropathy, PAD   Erectile dysfunction due to diabetes mellitus ([place])    Hepatitis C    Hyperlipidemia associated with  type 2 diabetes mellitus ([place])    Hypertensive heart disease with congestive heart failure and chronic kidney disease ([place])    TTE May 2018: Normal LV size and severe LVH-without LVOT gradient/S.A.M.  Normal EF 60 to 65%.?  GR 1 DD.  Mild LA dilation.;  CKD IV  - Cr 3.2   Hypertensive retinopathy    OU   RBBB (right bundle branch block)    Resistant hypertension    Managed by Dr. 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[facility])    Stroke ([place]) 10/2016   Has residual ataxia and partial left-sided hemiparesis   Past Surgical History  Past Surgical History:  Procedure Laterality Date   BACK SURGERY  1995   CATARACT EXTRACTION Bilateral    EXCISION MASS NECK N/A 08/06/2021   Procedure: EXCISION OF MANDIBULAR MASS;  Surgeon: Izora Gala, MD;  Location: [facility];  Service: ENT;  Laterality: N/A;   EYE SURGERY Bilateral    Cat Sx   PARS PLANA VITRECTOMY Left 01/25/2017   TOE AMPUTATION Right 2016   Right second toe; dry gangrene   TRANSTHORACIC ECHOCARDIOGRAM  10/2016   TTE May 2018: Normal LV size and severe LVH-without LVOT gradient/S.A.M.  Normal EF 60 to 65%.?  GR  1 DD.  Mild LA dilation.   Family History  Family History  Problem Relation Age of Onset   Coronary artery disease Mother        MI in her 57s   Diabetes Mother    Macular degeneration Mother    Hypertension Other    Diabetes Other    Alzheimer's disease Other    Coronary artery disease Brother    Diabetes Brother    Diabetes Sister    Social History  reports that he quit smoking about 3 years ago. His smoking use included cigarettes. He smoked an average of .25 packs per day. He has never used smokeless tobacco. He reports that he does not drink alcohol and does not use drugs. Allergies  Allergies  Allergen Reactions   Amlodipine Besy-Benazepril Hcl Anaphylaxis, Shortness Of Breath and Swelling    Mouth and tongue swelling   Shellfish Allergy Anaphylaxis, Shortness Of Breath and  Swelling   Home medications Prior to Admission medications   Medication Sig Start Date End Date Taking? Authorizing Provider  acetaminophen (TYLENOL) 500 MG tablet Take 1,000 mg by mouth 2 (two) times daily as needed (for pain or headaches).   Yes [provider]  Amino Acids-Protein Hydrolys (FEEDING SUPPLEMENT, PRO-STAT SUGAR FREE 64,) LIQD Take 30 mLs by mouth 2 (two) times daily.   Yes [provider]  aspirin 81 MG EC tablet Take 81 mg by mouth daily. 05/20/21  Yes [provider]  atorvastatin (LIPITOR) 40 MG tablet Take 40 mg by mouth daily. 05/20/21  Yes [provider]  Carboxymethylcellulose Sodium 1 % GEL Place 1 drop into both eyes 3 (three) times daily as needed (dry eyes).   Yes [provider]  carvedilol (COREG) 25 MG tablet Take 25 mg by mouth every 12 (twelve) hours.   Yes [provider]  chlorhexidine (PERIDEX) 0.12 % solution Use as directed 15 mLs in the mouth or throat 3 (three) times daily.   Yes [provider]  ferrous sulfate 220 (44 Fe) MG/5ML solution Place 300 mg into feeding tube daily.   Yes [provider]  gabapentin (NEURONTIN) 300 MG capsule Place 300 mg into feeding tube at bedtime.   Yes [provider]  Glucose (RA TRUEPLUS GLUCOSE) 15 GM/32ML GEL Give 15 g by tube daily as needed. Low Glucose 11/19/21  Yes [provider]  hydrALAZINE (APRESOLINE) 100 MG tablet Take 1 tablet (100 mg total) by mouth 3 (three) times daily. 11/11/16  Yes Angiulli, Lavon Paganini, PA-C  Insulin Glargine (BASAGLAR KWIKPEN) 100 UNIT/ML Inject 8 Units into the skin daily.   Yes [provider]  insulin lispro (HUMALOG) 100 UNIT/ML injection Inject 1-8 Units into the skin 5 (five) times daily. Sliding Scale: 70-120= 0 units, 121-150= 1 unit,151-200= 2 units, 201-250= 3 units, 251-300= 5 units,301-350= 7 units, 351-400= 8 units, > 400 = 8 units Call MD.   Yes [provider]  isosorbide  dinitrate (ISORDIL) 20 MG tablet Take 20 mg by mouth 3 (three) times daily.   Yes [provider]  Lactobacillus-Inulin (CULTURELLE ADULT ULT BALANCE PO) 1 capsule by PEG Tube route daily.   Yes [provider]  losartan (COZAAR) 100 MG tablet Place 100 mg into feeding tube daily.   Yes [provider]  melatonin 3 MG TABS tablet Take 6 mg by mouth at bedtime as needed for sleep. 11/19/21  Yes [provider]  Nutritional Supplements (FEEDING SUPPLEMENT, NEPRO CARB STEADY,) LIQD 237 mLs  5 (five) times daily.   Yes [provider]  oxyCODONE (OXY IR/ROXICODONE) 5 MG immediate release tablet Place 5 mg into feeding tube every 6 (six) hours as needed for severe pain.   Yes [provider]  pantoprazole sodium (PROTONIX) 40 mg/20 mL SUSP Place 40 mg into feeding tube daily.   Yes [provider]  polyethylene glycol (MIRALAX / GLYCOLAX) 17 g packet Take 17 g by mouth daily as needed for moderate constipation.   Yes [provider]  senna-docusate (SENOKOT-S) 8.6-50 MG tablet Place 2 tablets into feeding tube at bedtime as needed for mild constipation.   Yes [provider]  terazosin (HYTRIN) 2 MG capsule Take 2 mg by mouth daily.   Yes [provider]  torsemide (DEMADEX) 20 MG tablet Take 20 mg by mouth 2 (two) times daily.   Yes [provider]     Vitals:   12/08/21 1000 12/08/21 1100 12/08/21 1200 12/08/21 1237  BP: 135/66 (!) 139/94 130/88 (!) 151/77  Pulse: 76 86 88 76  Resp: _0 Temp:   98.2 F (36.8 C) 98 F (36.7 C)  TempSrc:   Axillary Axillary  SpO2: 97%  96% 100%   Exam Gen alert, no distress No rash, cyanosis or gangrene Sclera anicteric, jaw appears swollen No jvd or bruits Chest clear bilat to bases, no rales/ wheezing RRR no MRG Abd soft ntnd no mass or ascites +bs GU normal male MS no joint effusions or deformity Ext no LE or UE edema, no wounds or ulcers Neuro is  alert, Ox 3 , nonfocal     Home meds include - aspirin, atrovastatin, carvedilol 25 bid, hydralazine 100 tid, insulin glargine/ lispro,  oxycodone IR, bevacizumab,  demadex 20 bid, terazosin 58m qd, losartan 100 qd     CXR - left and right basilar opacities c/w infection, less likely edema    Na 140 BUN 122 Creat 4.08  Ca 9.1  WBC 16k  Hb 9.8  plt 209    UA 21-50 wbc, 0-5 rbc, rare bact, neg protein    B/l creat is 2.6- 2.9, eGFR 23 - 26 ml/min from early June 2023 in [place]  Assessment/ Plan: AKI on CKD IV - b/l creat is 2.6- 2.9 w/ eGFR 23 - 26 ml/min from early June 2023 in [place]. Creat here 4.2 in setting of gen'd weakness and vol depletion likely from strong diuretics (bid demadex) at home. Renal UKoreapending. UA unremarkable.  Possibly also taking ARB at home. Suspect AKI d/t vol depletion (bid demadex) +/- ARB effects. No hypotension or nephrotoxins. Got 1 L NS in ED yesterday. Will repeat 1.5 L bolus and resume IVF's at 65 cc/hr. F/u labs in am. Continue to hold demadex. If pt leaves tomorrow am, tell him and the daughter to hold demadex until they see Dr PPosey Prontoon Friday. Will follow.  PEG tube drainage - per pmd HTN - BP's are normal here today.  Hold off on BP lowering meds for now w/ AKI. Holding demadex.  Hx CVA Hx PEG feeding tube Mandibular cancer - sp recent surgical Rx w/ jaw reconstruction      RKelly Splinter MD 12/08/2021, 2:48 PM Recent Labs  Lab 12/07/21 2241 12/08/21 0351  HGB 9.8*  --   CALCIUM 9.7 9.1  CREATININE 4.11* 4.08*  K 3.5 3.4*

## 2021-12-08 NOTE — ED Notes (Signed)
Pt ambulated to BR with X 1 assist and walker. Tolerated well.

## 2021-12-08 NOTE — H&P (Addendum)
History and Physical    Patient: John Jacobson KYH:[id number] DOB: February 07, 1956 DOA: 12/07/2021 DOS: the patient was seen and examined on 12/08/2021 PCP: Benay Pike, MD  Patient coming from: Home  Chief Complaint:  Chief Complaint  Patient presents with   G Tube issue   HPI: [name]. is a 66 y.o. male with medical history significant of anemia, CKD, type 2 diabetes, hepatitis C, hypertension, history of heart failure, history of CVA with left-sided weakness, mandibular cancer status post jaw reconstruction here for progressive weakness, fatigue and drainage around his PEG tube.  Most of the history was obtained by daughter who is present during the entirety of the exam and history.  She reports that she has noticed that her father has been progressively fatigued, weak, and has low energy.  She also thinks there is a drainage coming out of her G-tube which is abnormal and does not flush it so it was not normally does.  She has not measured any fever, and denies chills, shortness of breath, chest pain, chest pressure, syncope, presyncope, lightheadedness or dizziness.  No change in medications.  Denies any difficulty urinating and reports no urinary burning, tea, or urgency.  Family reports they are also hoping to leave in the morning tomorrow to get to his ENT appointment which is really important to them.  The appointment is in [place] and so they would have to leave here by 8 or 9 AM.  Review of Systems: As mentioned in the history of present illness. All other systems reviewed and are negative. Past Medical History:  Diagnosis Date   Anemia    Asthma    Chronic kidney disease    Chronic lower back pain 1995   Lumbar back surgery   Diabetic retinopathy ([place])    PDR OU   DM (diabetes mellitus) type II controlled with renal manifestation ([place])    CKD-4, peripheral neuropathy, PAD   Erectile dysfunction due to diabetes mellitus ([place])    Hepatitis C     Hyperlipidemia associated with type 2 diabetes mellitus ([place])    Hypertensive heart disease with congestive heart failure and chronic kidney disease ([place])    TTE May 2018: Normal LV size and severe LVH-without LVOT gradient/S.A.M.  Normal EF 60 to 65%.?  GR 1 DD.  Mild LA dilation.;  CKD IV  - Cr 3.2   Hypertensive retinopathy    OU   RBBB (right bundle branch block)    Resistant hypertension    Managed by Dr. 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[facility])    Stroke ([place]) 10/2016   Has residual ataxia and partial left-sided hemiparesis   Past Surgical History:  Procedure Laterality Date   BACK SURGERY  1995   CATARACT EXTRACTION Bilateral    EXCISION MASS NECK N/A 08/06/2021   Procedure: EXCISION OF MANDIBULAR MASS;  Surgeon: Izora Gala, MD;  Location: [facility];  Service: ENT;  Laterality: N/A;   EYE SURGERY Bilateral    Cat Sx   PARS PLANA VITRECTOMY Left 01/25/2017   TOE AMPUTATION Right 2016   Right second toe; dry gangrene   TRANSTHORACIC ECHOCARDIOGRAM  10/2016   TTE May 2018: Normal LV size and severe LVH-without LVOT gradient/S.A.M.  Normal EF 60 to 65%.?  GR 1 DD.  Mild LA dilation.   Social History:  reports that he quit smoking about 3 years ago. His smoking use included cigarettes. He smoked an average of .25 packs  per day. He has never used smokeless tobacco. He reports that he does not drink alcohol and does not use drugs.  Allergies  Allergen Reactions   Amlodipine Besy-Benazepril Hcl Anaphylaxis, Shortness Of Breath and Swelling    Mouth and tongue swelling   Shellfish Allergy Anaphylaxis, Shortness Of Breath and Swelling    Family History  Problem Relation Age of Onset   Coronary artery disease Mother        MI in her 60s   Diabetes Mother    Macular degeneration Mother    Hypertension Other    Diabetes Other    Alzheimer's disease Other    Coronary artery disease Brother    Diabetes Brother    Diabetes Sister     Prior  to Admission medications   Medication Sig Start Date End Date Taking? Authorizing Provider  acetaminophen (TYLENOL) 500 MG tablet Take 1,000 mg by mouth 2 (two) times daily as needed (for pain or headaches).   Yes [provider]  Amino Acids-Protein Hydrolys (FEEDING SUPPLEMENT, PRO-STAT SUGAR FREE 64,) LIQD Take 30 mLs by mouth 2 (two) times daily.   Yes [provider]  aspirin 81 MG EC tablet Take 81 mg by mouth daily. 05/20/21  Yes [provider]  atorvastatin (LIPITOR) 40 MG tablet Take 40 mg by mouth daily. 05/20/21  Yes [provider]  Carboxymethylcellulose Sodium 1 % GEL Place 1 drop into both eyes 3 (three) times daily as needed (dry eyes).   Yes [provider]  carvedilol (COREG) 25 MG tablet Take 25 mg by mouth every 12 (twelve) hours.   Yes [provider]  chlorhexidine (PERIDEX) 0.12 % solution Use as directed 15 mLs in the mouth or throat 3 (three) times daily.   Yes [provider]  ferrous sulfate 220 (44 Fe) MG/5ML solution Place 300 mg into feeding tube daily.   Yes [provider]  gabapentin (NEURONTIN) 300 MG capsule Place 300 mg into feeding tube at bedtime.   Yes [provider]  Glucose (RA TRUEPLUS GLUCOSE) 15 GM/32ML GEL Give 15 g by tube daily as needed. Low Glucose 11/19/21  Yes [provider]  hydrALAZINE (APRESOLINE) 100 MG tablet Take 1 tablet (100 mg total) by mouth 3 (three) times daily. 11/11/16  Yes Angiulli, Daniel J, PA-C  Insulin Glargine (BASAGLAR KWIKPEN) 100 UNIT/ML Inject 8 Units into the skin daily.   Yes [provider]  insulin lispro (HUMALOG) 100 UNIT/ML injection Inject 1-8 Units into the skin 5 (five) times daily. Sliding Scale: 70-120= 0 units, 121-150= 1 unit,151-200= 2 units, 201-250= 3 units, 251-300= 5 units,301-350= 7 units, 351-400= 8 units, > 400 = 8 units Call MD.   Yes [provider]  isosorbide dinitrate (ISORDIL) 20 MG tablet Take 20  mg by mouth 3 (three) times daily.   Yes [provider]  Lactobacillus-Inulin (CULTURELLE ADULT ULT BALANCE PO) 1 capsule by PEG Tube route daily.   Yes [provider]  losartan (COZAAR) 100 MG tablet Place 100 mg into feeding tube daily.   Yes [provider]  melatonin 3 MG TABS tablet Take 6 mg by mouth at bedtime as needed for sleep. 11/19/21  Yes [provider]  Nutritional Supplements (FEEDING SUPPLEMENT, NEPRO CARB STEADY,) LIQD 237 mLs 5 (five) times daily.   Yes [provider]  oxyCODONE (OXY IR/ROXICODONE) 5 MG immediate release tablet Place 5 mg into feeding tube every 6 (six) hours as needed for severe pain.     Yes [provider]  pantoprazole sodium (PROTONIX) 40 mg/20 mL SUSP Place 40 mg into feeding tube daily.   Yes [provider]  polyethylene glycol (MIRALAX / GLYCOLAX) 17 g packet Take 17 g by mouth daily as needed for moderate constipation.   Yes [provider]  senna-docusate (SENOKOT-S) 8.6-50 MG tablet Place 2 tablets into feeding tube at bedtime as needed for mild constipation.   Yes [provider]  terazosin (HYTRIN) 2 MG capsule Take 2 mg by mouth daily.   Yes [provider]  torsemide (DEMADEX) 20 MG tablet Take 20 mg by mouth 2 (two) times daily.   Yes [provider]    Physical Exam: Vitals:   12/08/21 1000 12/08/21 1100 12/08/21 1200 12/08/21 1237  BP: 135/66 (!) 139/94 130/88 (!) 151/77  Pulse: 76 86 88 76  Resp: 14  16 16  Temp:   98.2 F (36.8 C) 98 F (36.7 C)  TempSrc:   Axillary Axillary  SpO2: 97%  96% 100%   Physical Exam Vitals and nursing note reviewed.  Constitutional:      General: He is not in acute distress.    Appearance: He is not diaphoretic.  HENT:     Head:     Comments: Left jaw swollen without tenderness to palpation throughout  Swollen tongue  No cervical lymphadenopathy Eyes:     Pupils: Pupils are equal, round, and reactive  to light.  Cardiovascular:     Rate and Rhythm: Normal rate and regular rhythm.     Pulses: Normal pulses.     Heart sounds: No murmur heard. Pulmonary:     Effort: Pulmonary effort is normal. No respiratory distress.     Breath sounds: Normal breath sounds. No rales.  Abdominal:     General: Abdomen is flat. There is no distension.     Palpations: Abdomen is soft.     Tenderness: There is no abdominal tenderness. There is no rebound.     Comments: G-tube in place without surrounding erythema, or pain with palpation. Surround area with mucous discharge.   Musculoskeletal:     Right lower leg: No edema.     Left lower leg: No edema.  Skin:    General: Skin is warm and dry.     Capillary Refill: Capillary refill takes less than 2 seconds.  Neurological:     Mental Status: He is alert and oriented to person, place, and time. Mental status is at baseline.  Psychiatric:        Mood and Affect: Mood normal.     Data Reviewed:     Latest Ref Rng & Units 12/07/2021   10:41 PM 09/14/2021    8:50 AM 08/06/2021   10:17 AM  CBC  WBC 4.0 - 10.5 K/uL 16.9  17.4    Hemoglobin 13.0 - 17.0 g/dL 9.8  9.3  10.2   Hematocrit 39.0 - 52.0 % 31.1  30.7  30.0   Platelets 150 - 400 K/uL 209  293        Latest Ref Rng & Units 12/08/2021    2:16 PM 12/08/2021    3:51 AM 12/07/2021   10:41 PM  BMP  Glucose 70 - 99 mg/dL 192  97  122   BUN 8 - 23 mg/dL 112  122  135   Creatinine 0.61 - 1.24 mg/dL 4.28  4.08  4.11   Sodium 135 - 145 mmol/L 139  141  140   Potassium 3.5 -   5.1 mmol/L 3.3  3.4  3.5   Chloride 98 - 111 mmol/L 102  101  99   CO2 22 - 32 mmol/L 29  28  30   Calcium 8.9 - 10.3 mg/dL 9.1  9.1  9.7    CT Neck:  1. Postoperative changes from prior left hemimandibulectomy with flap/fat graft placement, and bilateral nodal dissection. 2. Diffuse soft tissue density about the reconstructed mandible as well as the adjacent left submandibular space and submental region with overlying skin  thickening and stranding. Findings felt to be most consistent with post treatment changes. No visible residual or recurrent tumor. No discrete or loculated collections on this noncontrast examination. 3. No enlarged or pathologic adenopathy within the neck. Mildly prominent nodes at the left supraclavicular fossa measure up to 1 cm in short axis, similar to previous, and indeterminate. Attention at follow-up recommended. 4. No other acute abnormality.  CXR: 1. Limited expiratory study. 2. Left-greater-than-right basilar opacities which could be atelectasis or pneumonia. 3. Central vascular prominence which could be due to technique or mild perihilar vascular congestion. No overt edema. 4. Follow-up PA and lateral views recommended in full inspiration.    Assessment and Plan:  Markeem A Welden Sr. is a 66 y.o. male with medical history significant of anemia, CKD, type 2 diabetes, hepatitis C, hypertension, history of heart failure, history of CVA with left-sided weakness, mandibular cancer status post jaw reconstruction here for progressive weakness, fatigue and drainage around his PEG tube.  Family notes they really want to get to an ENT appointment tomorrow morning at 10 AM in [place].  They plan to leave here in the morning even if patient is not ready to medically leave.  Fatigue Weakness AKI on CKD (B/l Cr 2.6-2.9, eGFR 23-26) Patient and daughter report patient has been more fatigued, weak over the past several days.  Does not have any other localizing symptoms including fever, chills, cardiopulmonary symptoms, signs of infection.  Family does note they have had noticed some discharge from around his G-tube site but does not have any erythema, significant drainage from site on my inspection.  His G-tube flushes otherwise normally. Suspect his fatigue and weakness is from prerenal AKI.  Family and patient note that patient is taking diuretic, do not know what his free water flushes  supposed to be at the rehab facility. -Hold polyethylene glycol, ARB, diuretic  Leukocytosis C/f aspiration PNA Patient with leukocytosis higher than his baseline, chest x-ray concerning for infiltrates.  Patient does not report any significant respiratory symptoms, cough, or trouble breathing. - Continue Unasyn for possible aspiration pneumonia - Procalcitonin added  Report CHF per family Family reports a history of heart failure, echoes in our system showed normal EF.  Unfortunately unable to see outside echoes to confirm systolic or diastolic heart failure.  Given his AKI is likely due to volume depletion we will hold his diuretic, ARB.  He is not volume overloaded on exam.  He is not on MRA, SGLT2, or beta-blocker.   -Hold losartan 100 mg - Hold torsemide 40 mg twice daily - continue isosorbide 20 mg 3 times daily, hydralazine 25 mg -- Echocardiogram -Not on MRA, beta-blocker, SGLT2  G-tube Hx of mandibular cancer s/p excision and reconstruction G-tube ithout signs of infection on my exam.  Nursing reports appropriate flushing.  Do not think G-tube is infected or clogged.  As noted above patient to see his ENT physician tomorrow. - Nephro light 237 mL 5 times a day -Family is unsure   of free water flushes, will continue 20 mg every 4 hours -lipase/protease/amylase tablets - Protonix 40 mg per tube  DM2 Family unclear about diabetes regimen.  Patient reports Lantus 8 3 times daily as needed, along with short acting.  Family reports Lantus 8 units at night. -Continue Lantus 4 units along with sliding scale insulin     DVT ppx: lovenox Diet: as above Advance Care Planning:   Code Status: Full Code  Consults: renal Family Communication: dw daughter  Severity of Illness: The appropriate patient status for this patient is OBSERVATION. Observation status is judged to be reasonable and necessary in order to provide the required intensity of service to ensure the patient's safety.  The patient's presenting symptoms, physical exam findings, and initial radiographic and laboratory data in the context of their medical condition is felt to place them at decreased risk for further clinical deterioration. Furthermore, it is anticipated that the patient will be medically stable for discharge from the hospital within 2 midnights of admission.   Author: Rajiv C Patel, MD 12/08/2021 3:19 PM  For on call review [email or web address].  

## 2021-12-08 NOTE — ED Notes (Addendum)
Pt cleaned up of incontinence of bowel. Pt given bath in BR.

## 2021-12-08 NOTE — Progress Notes (Signed)
Initial Nutrition Assessment  DOCUMENTATION CODES:   Non-severe (moderate) malnutrition in context of chronic illness  INTERVENTION:  - continue 1 carton (240 ml) Nepro x5 day with 200 ml free water x6/day.  - will order 45 ml Prosource TF (or equivalent) BID to provide an additional 80 kcal and 22 grams protein.   - weigh patient.  NUTRITION DIAGNOSIS:   Moderate Malnutrition related to chronic illness, cancer and cancer related treatments as evidenced by moderate fat depletion, moderate muscle depletion, severe muscle depletion.  GOAL:   Patient will meet greater than or equal to 90% of their needs  MONITOR:   TF tolerance, Labs, Weight trends, Skin  REASON FOR ASSESSMENT:   Consult Enteral/tube feeding initiation and management  ASSESSMENT:   66 y.o. year-old male with medical history of anemia, CKD, type 2 DM, hepatitis C, asthma, CVA with L-sided weakness, diabetic retinopathy, CKD, chronic low back pain, HTN, hyperaldosteronism, ED d/t DM, HLD, and anemia. He presented to the ED from SNF due to progressive weakness and drainage around PEG tube. Patient admitted for AKI.  Patient laying in bed with his daughter on the phone. His daughter was able to share that his home TF regimen is 1 carton (240 ml) Nepro x5 day and she confirmed that current free water flush order (200 ml x6/day) is his usual.   This regimen provides 2100 kcal, 95 grams protein, and 2060 ml free water.   His daughter shares that he is able to have ice chips and water via spoon but no other items PO.   Patient has not been weighed since 11/30/21 when he weighed 153 lb. Patient requests to be weighed today.   Patient shares that he lost a significant amount of weight between 05/2021 and 09/2021. No weight recorded in the chart from December. Weight on 09/30/20 was 190 lb and weight on 09/14/21 was 145 lb. This indicates 45 lb weight loss (24% body weight) in 1 year; significant for time frame.    Labs  reviewed; CBGs: 135, 204, 177 mg/dl, K: 3.3 mmol/l, BUN: 112 mg/dl, creatinine: 4.28 mg/dl, Mg: WDL, GFR: 14 ml/min.  Medications reviewed; 1-8 units novolog TID, 4 units semglee/day, 40 mg protonix/day.  IVF; NS @ 65 ml/hr.    NUTRITION - FOCUSED PHYSICAL EXAM:  Flowsheet Row Most Recent Value  Orbital Region Moderate depletion  Upper Arm Region Moderate depletion  Thoracic and Lumbar Region Unable to assess  Buccal Region Moderate depletion  Temple Region Mild depletion  Clavicle Bone Region Severe depletion  Clavicle and Acromion Bone Region Severe depletion  Scapular Bone Region Moderate depletion  Dorsal Hand Mild depletion  Patellar Region Moderate depletion  Anterior Thigh Region Moderate depletion  Posterior Calf Region Moderate depletion  Edema (RD Assessment) None  Hair Reviewed  Eyes Reviewed  Mouth Reviewed  Skin Reviewed  Nails Reviewed       Diet Order:   Diet Order     None       EDUCATION NEEDS:   No education needs have been identified at this time  Skin:  Skin Assessment: Reviewed RN Assessment  Last BM:  PTA/unknown  Height:   Ht Readings from Last 1 Encounters:  09/14/21 '5\' 7"'$  (1.702 m)    Weight:   Wt Readings from Last 1 Encounters:  11/30/21 69.6 kg    Estimated Nutritional Needs:  Kcal:  2100-2300 kcal Protein:  105-120 grams Fluid:  >/= 2.3 L/day     Jarome Matin, MS, RD, LDN, CNSC  Registered Dietitian II Inpatient Clinical Nutrition RD pager # and on-call/weekend pager # available in [place]

## 2021-12-08 NOTE — ED Notes (Signed)
Pt assisted back to bed.  Tolerated well.

## 2021-12-08 NOTE — ED Notes (Signed)
Pt reports he does bolus feedings through G tube 5 times daily over 5 minutes each. Currently feedings ordered to be given 30 mL/hr. MD notified, per MD give feedings as bolus feedings over 5 minutes.

## 2021-12-09 ENCOUNTER — Other Ambulatory Visit (HOSPITAL_COMMUNITY): Payer: Non-veteran care

## 2021-12-09 DIAGNOSIS — N179 Acute kidney failure, unspecified: Secondary | ICD-10-CM | POA: Diagnosis not present

## 2021-12-09 LAB — COMPREHENSIVE METABOLIC PANEL
ALT: 21 U/L (ref 0–44)
AST: 19 U/L (ref 15–41)
Albumin: 2.6 g/dL — ABNORMAL LOW (ref 3.5–5.0)
Alkaline Phosphatase: 78 U/L (ref 38–126)
Anion gap: 10 (ref 5–15)
BUN: 113 mg/dL — ABNORMAL HIGH (ref 8–23)
CO2: 28 mmol/L (ref 22–32)
Calcium: 9 mg/dL (ref 8.9–10.3)
Chloride: 103 mmol/L (ref 98–111)
Creatinine, Ser: 4 mg/dL — ABNORMAL HIGH (ref 0.61–1.24)
GFR, Estimated: 16 mL/min — ABNORMAL LOW (ref >60)
Glucose, Bld: 168 mg/dL — ABNORMAL HIGH (ref 70–99)
Potassium: 3.2 mmol/L — ABNORMAL LOW (ref 3.5–5.1)
Sodium: 141 mmol/L (ref 135–145)
Total Bilirubin: 0.4 mg/dL (ref 0.3–1.2)
Total Protein: 6.4 g/dL — ABNORMAL LOW (ref 6.5–8.1)

## 2021-12-09 LAB — CBC
HCT: 28.1 % — ABNORMAL LOW (ref 39.0–52.0)
Hemoglobin: 8.9 g/dL — ABNORMAL LOW (ref 13.0–17.0)
MCH: 32.7 pg (ref 26.0–34.0)
MCHC: 31.7 g/dL (ref 30.0–36.0)
MCV: 103.3 fL — ABNORMAL HIGH (ref 80.0–100.0)
Platelets: 194 10*3/uL (ref 150–400)
RBC: 2.72 MIL/uL — ABNORMAL LOW (ref 4.22–5.81)
RDW: 14.7 % (ref 11.5–15.5)
WBC: 18.3 10*3/uL — ABNORMAL HIGH (ref 4.0–10.5)
nRBC: 0 % (ref 0.0–0.2)

## 2021-12-09 LAB — GLUCOSE, CAPILLARY
Glucose-Capillary: 180 mg/dL — ABNORMAL HIGH (ref 70–99)
Glucose-Capillary: 158 mg/dL — ABNORMAL HIGH (ref 70–99)

## 2021-12-09 LAB — PROCALCITONIN: Procalcitonin: 0.14 ng/mL

## 2021-12-09 MED ORDER — AMOXICILLIN-POT CLAVULANATE 250-62.5 MG/5ML PO SUSR
500.0000 mg | Freq: Two times a day (BID) | ORAL | 0 refills | Status: DC
Start: 2021-12-09 — End: 2021-12-21

## 2021-12-09 MED ORDER — FREE WATER: 200.0000 mL | Status: AC

## 2021-12-09 NOTE — TOC Transition Note (Signed)
Transition of Care [facility]) - CM/SW Discharge Note   Patient Details  Name: John Jacobson. MRN: [id number] Date of Birth: August 22, 1955  [facility]) CM/SW Contact:  Vassie Moselle, LCSW Phone Number: 12/09/2021, 9:45 AM   Clinical Narrative:    Pt coming from [facility]. Pt left AMA with family to ENT appointment in [facility]. Pt able to return to [facility].   Final next level of care: Skilled Nursing Facility Barriers to Discharge: Patient left Against Medical Advice [facility])   Patient Goals and CMS Choice Patient states their goals for this hospitalization and ongoing recovery are:: UTA      Discharge Placement                       Discharge Plan and Services                DME Arranged: N/A                    Social Determinants of Health (SDOH) Interventions     Readmission Risk Interventions     No data to display

## 2021-12-09 NOTE — Progress Notes (Signed)
John Jacobson Progress Note  Subjective: seen in hallway. 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   Vitals:   12/08/21 2043 12/08/21 2100 12/09/21 0040 12/09/21 0500  BP: (!) 143/72  (!) 143/68   Pulse: 80  78   Resp: 16     Temp: 98.4 F (36.9 C)  98 F (36.7 C)   TempSrc: Oral  Oral   SpO2: 100%  99%   Weight:  72.4 kg  73.1 kg  Height:  '5\' 7"'  (1.702 m)      Exam: Gen alert, no distress Sclera anicteric, jaw appears swollen No jvd or bruits Chest clear bilat to bases RRR no MRG Abd soft ntnd no mass or ascites +bs Ext no LE edema Neuro is alert, Ox 3 , nonfocal        Home meds include - aspirin, atrovastatin, carvedilol 25 bid, hydralazine 100 tid, insulin glargine/ lispro,  oxycodone IR, bevacizumab,  demadex 20 bid, terazosin 28m qd, losartan 100 qd      CXR - left and right basilar opacities c/w infection, less likely edema    Na 140 BUN 122 Creat 4.08  Ca 9.1  WBC 16k  Hb 9.8  plt 209    UA 21-50 wbc, 0-5 rbc, rare bact, neg protein    B/l creat is 2.6- 2.9, eGFR 23 - 26 ml/min from early June 2023 in [place]   Renal [place]- IMPRESSION: No hydronephrosis. Increased renal echogenicity as can be seen in medical renal disease.   Assessment/ Plan: AKI on CKD IV - b/l creat is 2.6- 2.9 w/ [place]GFR 23 - 26 ml[place] 4.2 in setting generalized weakness, vol depletion. Renal [place] UA unremarkable. Suspect AKI d/t vol depletion (diuretics, poor intake) + ARB effects. Got 1 L NS in ED. Repeated 1.5 L bolus and resumed IVF's at 65 cc/hr. F/u labs today were not much better w/ creat 4.0. [facility] this [facility] Not sure if he will return for readmission here or not. Daughter knows to hold losartan and demadex until they see Dr PPosey Prontoon Friday.  PEG tube drainage - per pmd HTN - BP's are normal here today.  Avoid losartan/ diuretics for the time being until he can see his renal  provider .  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   No results for input(s): "IRON", "TIBC", "FERRITIN" in the last 168 hours.  Invalid input(s): "SATURATION RATIO" Inpatient medications:  aspirin EC  81 mg Oral Daily   chlorhexidine  5 mL Mouth/Throat QID   enoxaparin (LOVENOX) injection  30 mg Subcutaneous Q24H   feeding supplement (NEPRO CARB STEADY)  237 mL Per Tube 5 X Daily   feeding supplement (PROSource TF)  45 mL Per Tube BID   free water  200 mL Per Tube Q4H   gabapentin  300 mg Per Tube QHS   hydrALAZINE  100 mg Oral TID   insulin aspart  1-8 Units Subcutaneous TID WC   insulin glargine-yfgn  4 Units Subcutaneous Daily   isosorbide dinitrate  20 mg Per Tube TID   lipase/protease/amylase)  20,880 Units Per Tube Once   pantoprazole sodium  40 mg Per Tube Daily   terazosin  2 mg Per Tube QHS    ampicillin-sulbactam (UNASYN) IV 3 g (12/09/21 0235)   acetaminophen, lip balm, oxyCODONE, senna-docusate

## 2021-12-09 NOTE — Progress Notes (Signed)
Pt given AVS paperwork by bedside RN, per MD order

## 2021-12-09 NOTE — Discharge Summary (Signed)
Physician Discharge Summary  John Jacobson OZD:[id number] DOB: 27-Mar-1956 DOA: 12/07/2021  PCP: Benay Pike, MD  Admit date: 12/07/2021 Discharge date: 12/09/2021  Admitted From: Skilled nursing facility Disposition: Left AGAINST MEDICAL ADVICE  Recommendations for Outpatient Follow-up:  Follow up with PCP in 1 to 2 days Please obtain BMP/CBC in 1 to 2 days Keep up the follow-up with outpatient providers including nephrology.   Discharge Condition: Fair CODE STATUS: Full code Diet recommendation: N.p.o. tube feeding with following instructions  Discharge summary: This patient left AGAINST MEDICAL ADVICE to keep of his outpatient appointments, he has not completed medical treatment, however remains fairly stable. There was no emergent surgical procedure needed from ENT, however patient and family did not agree to move his appointments but to go to appointment today at 11 so discharged him with instructions and modifications of therapy.  66 year old with history of type 2 diabetes on insulin, [place], hypertension, history of stroke with left-sided weakness, mandibular cancer status post jaw reconstruction and currently at a SNF brought to the emergency room with progressive weakness, fatigue, leaking PEG tube.  In the emergency room he was hemodynamically stable.  His PEG tube was intact and functioning.  Patient was found with elevated WBC count, AKI on CKD with known baseline creatinine about 2.5.  Taking losartan and diuretics. Chest x-ray showed bilateral basilar opacities, left more than right. Patient was admitted to hospital, treated with IV antibiotics with Unasyn, started on IV fluid boluses and maintenance IV fluid.  PEG tube with normal function however with minimal leakage around the insertion site.  Seen and followed by nephrology.  Acute kidney injury on CKD stage IV, baseline creatinine about 2.5-2.9, followed by [facility] Dr. Posey Pronto This is a fully prerenal  injury related to inadequate hydration through the PEG tube, ongoing use of losartan and torsemide.  Seen by nephrology.  Recommended conservative management.  No evidence of uremia.  Kidney functions still abnormal, needing more IV hydration and adjustment of free water through the PEG tube, as patient is leaving with her completing treatment -Advised to discontinue losartan and torsemide altogether, 200 mL free water flush every 4 hours through the PEG tube and ensure adequate fluid administration.  Suspected bilateral aspiration pneumonia: Probably acute on chronic findings.  Patient is on room air.  Mobilizing around.  Leukocytosis present.  Treated with Unasyn.  Plan to continue IV antibiotics, as he is leaving, prescribed Augmentin for 10 days.  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     Discharge Diagnoses:  Principal Problem:   AKI (acute kidney injury) [facility]) Active Problems:   Fatigue    Discharge Instructions  Discharge Instructions     Call MD for:  difficulty breathing, headache or visual disturbances   Complete by: As directed    Call MD for:  extreme fatigue   Complete by: As directed    Call MD for:  temperature >100.4   Complete by: As directed    Discharge instructions   Complete by: As directed    Some changes in medications  Repeat blood tests next 2-3 days   Increase activity slowly   Complete by: As directed    No wound care   Complete by: As directed       Allergies as of 12/09/2021       Reactions   Amlodipine Besy-benazepril Hcl Anaphylaxis, Shortness Of Breath, Swelling   Mouth and tongue swelling   Shellfish Allergy Anaphylaxis, Shortness  Of Breath, Swelling        Medication List     STOP taking these medications    Carboxymethylcellulose Sodium 1 % Gel   losartan 100 MG tablet Commonly known as: COZAAR   torsemide 20 MG tablet Commonly known as: DEMADEX       TAKE these  medications    acetaminophen 500 MG tablet Commonly known as: TYLENOL Take 1,000 mg by mouth 2 (two) times daily as needed (for pain or headaches).   amoxicillin-clavulanate 250-62.5 MG/5ML suspension Commonly known as: AUGMENTIN Take 10 mLs (500 mg total) by mouth 2 (two) times daily for 10 days.   aspirin EC 81 MG tablet Take 81 mg by mouth daily.   atorvastatin 40 MG tablet Commonly known as: LIPITOR Take 40 mg by mouth daily.   Basaglar KwikPen 100 UNIT/ML Inject 8 Units into the skin daily.   carvedilol 25 MG tablet Commonly known as: COREG Take 25 mg by mouth every 12 (twelve) hours.   chlorhexidine 0.12 % solution Commonly known as: PERIDEX Use as directed 15 mLs in the mouth or throat 3 (three) times daily.   CULTURELLE ADULT ULT BALANCE PO 1 capsule by PEG Tube route daily.   feeding supplement (NEPRO CARB STEADY) Liqd 237 mLs 5 (five) times daily.   feeding supplement (PRO-STAT SUGAR FREE 64) Liqd Take 30 mLs by mouth 2 (two) times daily.   ferrous sulfate 220 (44 Fe) MG/5ML solution Place 300 mg into feeding tube daily.   free water Soln Place 200 mLs into feeding tube every 4 (four) hours.   gabapentin 300 MG capsule Commonly known as: NEURONTIN Place 300 mg into feeding tube at bedtime.   hydrALAZINE 100 MG tablet Commonly known as: APRESOLINE Take 1 tablet (100 mg total) by mouth 3 (three) times daily.   insulin lispro 100 UNIT/ML injection Commonly known as: HUMALOG Inject 1-8 Units into the skin 5 (five) times daily. Sliding Scale: 70-120= 0 units, 121-150= 1 unit,151-200= 2 units, 201-250= 3 units, 251-300= 5 units,301-350= 7 units, 351-400= 8 units, > 400 = 8 units Call MD.   isosorbide dinitrate 20 MG tablet Commonly known as: ISORDIL Take 20 mg by mouth 3 (three) times daily.   melatonin 3 MG Tabs tablet Take 6 mg by mouth at bedtime as needed for sleep.   oxyCODONE 5 MG immediate release tablet Commonly known as: Oxy  IR/ROXICODONE Place 5 mg into feeding tube every 6 (six) hours as needed for severe pain.   pantoprazole sodium 40 mg/20 mL Susp Commonly known as: PROTONIX Place 40 mg into feeding tube daily.   polyethylene glycol 17 g packet Commonly known as: MIRALAX / GLYCOLAX Take 17 g by mouth daily as needed for moderate constipation.   RA TRUEplus Glucose 15 GM/32ML Gel Generic drug: Glucose Give 15 g by tube daily as needed. Low Glucose   senna-docusate 8.6-50 MG tablet Commonly known as: Senokot-S Place 2 tablets into feeding tube at bedtime as needed for mild constipation.   terazosin 2 MG capsule Commonly known as: HYTRIN Take 2 mg by mouth daily.        Allergies  Allergen Reactions   Amlodipine Besy-Benazepril Hcl Anaphylaxis, Shortness Of Breath and Swelling    Mouth and tongue swelling   Shellfish Allergy Anaphylaxis, Shortness Of Breath and Swelling    Consultations: Nephrology   Procedures/Studies: US RENAL  Result Date: 12/08/2021 CLINICAL DATA:  Acute renal failure EXAM: RENAL / URINARY TRACT ULTRASOUND COMPLETE COMPARISON:  None Available.  FINDINGS: Right Kidney: Renal measurements: 10.6 x 5.4 x 7.2 cm = volume: 116.4 mL. No hydronephrosis. Increased renal cortical echogenicity. Left Kidney: Renal measurements: 10.9 x 6.7 x 6.1 cm = volume: 129.8 mL. No hydronephrosis. Increased renal cortical echogenicity. Bladder: Mild dependent debris in the posterior bladder. Bilateral ureteral jets are seen. The bladder is well distended. Other: None. IMPRESSION: No hydronephrosis. Increased renal echogenicity as can be seen in medical renal disease. Mild dependent debris in the posterior bladder. Correlate with urinalysis. Electronically Signed   By: Maurine Simmering M.D.   On: 12/08/2021 16:35   DG CHEST PORT 1 VIEW  Result Date: 12/08/2021 CLINICAL DATA:  Cough with history of mandibular cancer. EXAM: PORTABLE CHEST 1 VIEW COMPARISON:  Portable chest 02/11/2018. FINDINGS: The  patient's neck soft tissues minimally obscure the medial apices. There is mild-to-moderate cardiomegaly. The lungs expiratory limiting the study. Mediastinum appears stable with calcifications in the aortic arch. There is prominence of the central vessels which could be due to expiration or mild perihilar vascular congestion. No pleural effusion is seen. There is increased opacity in the hypoinflated bases left-greater-than-right which could be atelectasis, pneumonia or aspiration. No acute osseous abnormality is seen. No pneumothorax. Mild thoracic dextroscoliosis. IMPRESSION: 1. Limited expiratory study. 2. Left-greater-than-right basilar opacities which could be atelectasis or pneumonia. 3. Central vascular prominence which could be due to technique or mild perihilar vascular congestion. No overt edema. 4. Follow-up PA and lateral views recommended in full inspiration. Electronically Signed   By: Telford Nab M.D.   On: 12/08/2021 02:34   CT Soft Tissue Neck Wo Contrast  Result Date: 12/08/2021 CLINICAL DATA:  Initial evaluation for soft tissue swelling, infection suspected. EXAM: CT NECK WITHOUT CONTRAST TECHNIQUE: Multidetector CT imaging of the neck was performed following the standard protocol without intravenous contrast. RADIATION DOSE REDUCTION: This exam was performed according to the departmental dose-optimization program which includes automated exposure control, adjustment of the mA and/or kV according to patient size and/or use of iterative reconstruction technique. COMPARISON:  Prior exams from 11/24/2021 as well as earlier studies. FINDINGS: Pharynx and larynx: Postoperative changes from prior left hemimandibulectomy with flap/fat graft placement, and bilateral nodal dissection. Diffuse soft tissue density seen about the reconstructed mandible as well as the adjacent left submandibular space and submental region. Overlying skin thickening with stranding in the subcutaneous fat, which could  reflect post treatment changes. No visible collections evident on this noncontrast examination. No appreciable or residual tumor or mass. Oropharynx and nasopharynx within normal limits. Trace retropharyngeal effusion without loculated collection. Negative epiglottis. Small amount of layering secretions noted within the hypopharynx. Remainder of the supraglottic larynx and glottis are within normal limits. Subglottic airway patent clear. Salivary glands: Right parotid gland within normal limits. Right submandibular gland likely remains and is grossly unremarkable. The left parotid and submandibular glands appear to have been resected. Thyroid: No significant finding. Lymph nodes: Sequelae of prior bilateral nodal dissection seen within the neck. No visible enlarged or pathologic adenopathy. Mildly prominent nodes at the left supraclavicular fossa measure up to 1 cm in short axis, similar to previous, and indeterminate. Vascular: Scattered atheromatous change about the carotid bifurcations and visualized carotid siphons. Limited intracranial: Unremarkable. Visualized orbits: Prior bilateral ocular lens replacement. Otherwise unremarkable. Mastoids and visualized paranasal sinuses: Mild scattered mucoperiosteal thickening present about the ethmoidal air cells and maxillary sinuses. Visualized paranasal sinuses are otherwise clear. Trace right mastoid effusion noted. Left mastoid air cells are clear. Skeleton: No worrisome osseous lesions. Subcentimeter sclerotic  lesion within the T3 vertebral body noted, stable from prior, likely a small benign bone island. Moderate spondylosis present at C3-4 through C6-7. Upper chest: Visualized upper chest demonstrates no acute finding. Partially visualized lungs are largely clear. Sequelae of prior left axillary nodal dissection noted as well. Other: None. IMPRESSION: 1. Postoperative changes from prior left hemimandibulectomy with flap/fat graft placement, and bilateral nodal  dissection. 2. Diffuse soft tissue density about the reconstructed mandible as well as the adjacent left submandibular space and submental region with overlying skin thickening and stranding. Findings felt to be most consistent with post treatment changes. No visible residual or recurrent tumor. No discrete or loculated collections on this noncontrast examination. 3. No enlarged or pathologic adenopathy within the neck. Mildly prominent nodes at the left supraclavicular fossa measure up to 1 cm in short axis, similar to previous, and indeterminate. Attention at follow-up recommended. 4. No other acute abnormality. Electronically Signed   By: Jeannine Boga M.D.   On: 12/08/2021 01:30   (Echo, Carotid, EGD, Colonoscopy, ERCP)    Subjective: Patient seen and examined.  Denies any complaints.  He has some difficulties in his job but without any pain or fever.  There is some discomfort inside the mouth which is not new.  PEG tube was used without difficulty for medication administration. Daughter arrived to the room,  Recommended to postpone his ENT appointment as there does not look like to be very emergent issue but patient and daughter did not agree.  There was no emergent need for inpatient ENT evaluation as he has established care and they were planning for radiation treatment. We even explore options for day trip to come back to the hospital, however this could not be managed for nonemergent and services that are not required to be provided today.  Even though he left AGAINST MEDICAL ADVICE, for a small transition and safety of the patient, I did a modification of treatment plan including changes in medications, tube feeding instructions and antibiotics.   Discharge Exam: Vitals:   12/08/21 2043 12/09/21 0040  BP: (!) 143/72 (!) 143/68  Pulse: 80 78  Resp: 16   Temp: 98.4 F (36.9 C) 98 F (36.7 C)  SpO2: 100% 99%   Vitals:   12/08/21 2043 12/08/21 2100 12/09/21 0040 12/09/21 0500   BP: (!) 143/72  (!) 143/68   Pulse: 80  78   Resp: 16     Temp: 98.4 F (36.9 C)  98 F (36.7 C)   TempSrc: Oral  Oral   SpO2: 100%  99%   Weight:  72.4 kg  73.1 kg  Height:  '5\' 7"'$  (1.702 m)      General: Pt is alert, awake, not in acute distress Chronically sick looking.  Frail. Left jaw is swollen without tenderness.  He has swollen tongue but no evidence of abscess.  He has some secretions.  He is able to clear them. He talks with some dysarthric speech but normal as per family. Cardiovascular: RRR, S1/S2 +, no rubs, no gallops Respiratory: Some conducted upper airway sounds.  On room air.  Not in any distress. Abdominal: Soft, NT, ND, bowel sounds + G-tube in place without surrounding erythema.  Minimal drainage around the insertion site. Extremities: no edema, no cyanosis    The results of significant diagnostics from this hospitalization (including imaging, microbiology, ancillary and laboratory) are listed below for reference.     Microbiology: No results found for this or any previous visit (from the past 240  hour(s)).   Labs: BNP (last 3 results) No results for input(s): "BNP" in the last 8760 hours. Basic Metabolic Panel: Recent Labs  Lab 12/07/21 2241 12/08/21 0351 12/08/21 1416 12/09/21 0507  NA 140 141 139 141  K 3.5 3.4* 3.3* 3.2*  CL 99 101 102 103  CO2 '30 28 29 28  '$ GLUCOSE 122* 97 192* 168*  BUN 135* 122* 112* 113*  CREATININE 4.11* 4.08* 4.28* 4.00*  CALCIUM 9.7 9.1 9.1 9.0  MG  --   --  2.0  --    Liver Function Tests: Recent Labs  Lab 12/09/21 0507  AST 19  ALT 21  ALKPHOS 78  BILITOT 0.4  PROT 6.4*  ALBUMIN 2.6*   No results for input(s): "LIPASE", "AMYLASE" in the last 168 hours. No results for input(s): "AMMONIA" in the last 168 hours. CBC: Recent Labs  Lab 12/07/21 2241 12/09/21 0507  WBC 16.9* 18.3*  NEUTROABS 8.4*  --   HGB 9.8* 8.9*  HCT 31.1* 28.1*  MCV 102.3* 103.3*  PLT 209 194   Cardiac Enzymes: No results for  input(s): "CKTOTAL", "CKMB", "CKMBINDEX", "TROPONINI" in the last 168 hours. BNP: Invalid input(s): "POCBNP" CBG: Recent Labs  Lab 12/08/21 1624 12/08/21 2017 12/08/21 2353 12/09/21 0344 12/09/21 0746  GLUCAP 177* 149* 186* 180* 158*   D-Dimer No results for input(s): "DDIMER" in the last 72 hours. Hgb A1c No results for input(s): "HGBA1C" in the last 72 hours. Lipid Profile No results for input(s): "CHOL", "HDL", "LDLCALC", "TRIG", "CHOLHDL", "LDLDIRECT" in the last 72 hours. Thyroid function studies No results for input(s): "TSH", "T4TOTAL", "T3FREE", "THYROIDAB" in the last 72 hours.  Invalid input(s): "FREET3" Anemia work up No results for input(s): "VITAMINB12", "FOLATE", "FERRITIN", "TIBC", "IRON", "RETICCTPCT" in the last 72 hours. Urinalysis    Component Value Date/Time   COLORURINE YELLOW 12/08/2021 0201   APPEARANCEUR HAZY (A) 12/08/2021 0201   LABSPEC 1.013 12/08/2021 0201   PHURINE 5.0 12/08/2021 0201   GLUCOSEU NEGATIVE 12/08/2021 0201   HGBUR NEGATIVE 12/08/2021 0201   BILIRUBINUR NEGATIVE 12/08/2021 0201   KETONESUR NEGATIVE 12/08/2021 0201   PROTEINUR NEGATIVE 12/08/2021 0201   NITRITE NEGATIVE 12/08/2021 0201   LEUKOCYTESUR SMALL (A) 12/08/2021 0201   Sepsis Labs Recent Labs  Lab 12/07/21 2241 12/09/21 0507  WBC 16.9* 18.3*   Microbiology No results found for this or any previous visit (from the past 240 hour(s)).   Time coordinating discharge: 35 minutes  SIGNED:   Barb Merino, MD  [facility] 12/09/2021, 10:13 AM

## 2021-12-13 DIAGNOSIS — L089 Local infection of the skin and subcutaneous tissue, unspecified: Secondary | ICD-10-CM | POA: Insufficient documentation

## 2021-12-15 ENCOUNTER — Inpatient Hospital Stay (HOSPITAL_COMMUNITY)
Admission: EM | Admit: 2021-12-15 | Discharge: 2021-12-21 | DRG: 863 | Disposition: A | Discharge status: Skilled Nursing Facility | Payer: No Typology Code available for payment source | Source: Skilled Nursing Facility | Attending: Student | Admitting: Student

## 2021-12-15 ENCOUNTER — Ambulatory Visit
Admission: RE | Admit: 2021-12-15 | Discharge: 2021-12-15 | Disposition: A | Discharge status: Home / Self Care | Payer: No Typology Code available for payment source | Source: Ambulatory Visit | Attending: Radiation Oncology | Admitting: Radiation Oncology

## 2021-12-15 ENCOUNTER — Emergency Department (HOSPITAL_COMMUNITY): Payer: No Typology Code available for payment source

## 2021-12-15 ENCOUNTER — Other Ambulatory Visit: Payer: Self-pay

## 2021-12-15 ENCOUNTER — Encounter (HOSPITAL_COMMUNITY): Payer: Self-pay

## 2021-12-15 DIAGNOSIS — Z794 Long term (current) use of insulin: Secondary | ICD-10-CM

## 2021-12-15 DIAGNOSIS — I129 Hypertensive chronic kidney disease with stage 1 through stage 4 chronic kidney disease, or unspecified chronic kidney disease: Secondary | ICD-10-CM | POA: Diagnosis present

## 2021-12-15 DIAGNOSIS — R1312 Dysphagia, oropharyngeal phase: Secondary | ICD-10-CM | POA: Diagnosis present

## 2021-12-15 DIAGNOSIS — N179 Acute kidney failure, unspecified: Secondary | ICD-10-CM | POA: Diagnosis present

## 2021-12-15 DIAGNOSIS — D72829 Elevated white blood cell count, unspecified: Secondary | ICD-10-CM

## 2021-12-15 DIAGNOSIS — Z8249 Family history of ischemic heart disease and other diseases of the circulatory system: Secondary | ICD-10-CM

## 2021-12-15 DIAGNOSIS — I739 Peripheral vascular disease, unspecified: Secondary | ICD-10-CM | POA: Diagnosis not present

## 2021-12-15 DIAGNOSIS — Z888 Allergy status to other drugs, medicaments and biological substances status: Secondary | ICD-10-CM

## 2021-12-15 DIAGNOSIS — E785 Hyperlipidemia, unspecified: Secondary | ICD-10-CM | POA: Diagnosis present

## 2021-12-15 DIAGNOSIS — Z931 Gastrostomy status: Secondary | ICD-10-CM

## 2021-12-15 DIAGNOSIS — K219 Gastro-esophageal reflux disease without esophagitis: Secondary | ICD-10-CM

## 2021-12-15 DIAGNOSIS — Z89421 Acquired absence of other right toe(s): Secondary | ICD-10-CM

## 2021-12-15 DIAGNOSIS — C069 Malignant neoplasm of mouth, unspecified: Secondary | ICD-10-CM | POA: Diagnosis not present

## 2021-12-15 DIAGNOSIS — N184 Chronic kidney disease, stage 4 (severe): Secondary | ICD-10-CM | POA: Diagnosis present

## 2021-12-15 DIAGNOSIS — N1832 Chronic kidney disease, stage 3b: Secondary | ICD-10-CM | POA: Diagnosis present

## 2021-12-15 DIAGNOSIS — E1142 Type 2 diabetes mellitus with diabetic polyneuropathy: Secondary | ICD-10-CM | POA: Diagnosis present

## 2021-12-15 DIAGNOSIS — M272 Inflammatory conditions of jaws: Secondary | ICD-10-CM | POA: Diagnosis present

## 2021-12-15 DIAGNOSIS — D721 Eosinophilia, unspecified: Secondary | ICD-10-CM | POA: Diagnosis present

## 2021-12-15 DIAGNOSIS — Z9841 Cataract extraction status, right eye: Secondary | ICD-10-CM

## 2021-12-15 DIAGNOSIS — Z7982 Long term (current) use of aspirin: Secondary | ICD-10-CM

## 2021-12-15 DIAGNOSIS — E1129 Type 2 diabetes mellitus with other diabetic kidney complication: Secondary | ICD-10-CM | POA: Diagnosis present

## 2021-12-15 DIAGNOSIS — D638 Anemia in other chronic diseases classified elsewhere: Secondary | ICD-10-CM | POA: Diagnosis present

## 2021-12-15 DIAGNOSIS — J45909 Unspecified asthma, uncomplicated: Secondary | ICD-10-CM | POA: Diagnosis present

## 2021-12-15 DIAGNOSIS — N521 Erectile dysfunction due to diseases classified elsewhere: Secondary | ICD-10-CM | POA: Diagnosis present

## 2021-12-15 DIAGNOSIS — D72825 Bandemia: Secondary | ICD-10-CM

## 2021-12-15 DIAGNOSIS — I10 Essential (primary) hypertension: Secondary | ICD-10-CM | POA: Diagnosis not present

## 2021-12-15 DIAGNOSIS — Z9842 Cataract extraction status, left eye: Secondary | ICD-10-CM

## 2021-12-15 DIAGNOSIS — E876 Hypokalemia: Secondary | ICD-10-CM | POA: Diagnosis present

## 2021-12-15 DIAGNOSIS — R7881 Bacteremia: Secondary | ICD-10-CM | POA: Diagnosis present

## 2021-12-15 DIAGNOSIS — G8194 Hemiplegia, unspecified affecting left nondominant side: Secondary | ICD-10-CM | POA: Diagnosis present

## 2021-12-15 DIAGNOSIS — E440 Moderate protein-calorie malnutrition: Secondary | ICD-10-CM | POA: Diagnosis present

## 2021-12-15 DIAGNOSIS — A498 Other bacterial infections of unspecified site: Secondary | ICD-10-CM

## 2021-12-15 DIAGNOSIS — M545 Low back pain, unspecified: Secondary | ICD-10-CM | POA: Diagnosis present

## 2021-12-15 DIAGNOSIS — T8141XA Infection following a procedure, superficial incisional surgical site, initial encounter: Principal | ICD-10-CM | POA: Diagnosis present

## 2021-12-15 DIAGNOSIS — Z8673 Personal history of transient ischemic attack (TIA), and cerebral infarction without residual deficits: Secondary | ICD-10-CM | POA: Diagnosis not present

## 2021-12-15 DIAGNOSIS — C411 Malignant neoplasm of mandible: Secondary | ICD-10-CM | POA: Diagnosis present

## 2021-12-15 DIAGNOSIS — Z79899 Other long term (current) drug therapy: Secondary | ICD-10-CM

## 2021-12-15 DIAGNOSIS — E1169 Type 2 diabetes mellitus with other specified complication: Secondary | ICD-10-CM | POA: Diagnosis present

## 2021-12-15 DIAGNOSIS — Z91013 Allergy to seafood: Secondary | ICD-10-CM

## 2021-12-15 DIAGNOSIS — B1920 Unspecified viral hepatitis C without hepatic coma: Secondary | ICD-10-CM | POA: Diagnosis present

## 2021-12-15 DIAGNOSIS — E1121 Type 2 diabetes mellitus with diabetic nephropathy: Secondary | ICD-10-CM | POA: Diagnosis not present

## 2021-12-15 DIAGNOSIS — R270 Ataxia, unspecified: Secondary | ICD-10-CM | POA: Diagnosis present

## 2021-12-15 DIAGNOSIS — L089 Local infection of the skin and subcutaneous tissue, unspecified: Secondary | ICD-10-CM

## 2021-12-15 DIAGNOSIS — G8929 Other chronic pain: Secondary | ICD-10-CM | POA: Diagnosis present

## 2021-12-15 DIAGNOSIS — Z6825 Body mass index (BMI) 25.0-25.9, adult: Secondary | ICD-10-CM

## 2021-12-15 DIAGNOSIS — Z87891 Personal history of nicotine dependence: Secondary | ICD-10-CM

## 2021-12-15 DIAGNOSIS — E1152 Type 2 diabetes mellitus with diabetic peripheral angiopathy with gangrene: Secondary | ICD-10-CM | POA: Diagnosis present

## 2021-12-15 DIAGNOSIS — E1122 Type 2 diabetes mellitus with diabetic chronic kidney disease: Secondary | ICD-10-CM | POA: Diagnosis present

## 2021-12-15 DIAGNOSIS — M109 Gout, unspecified: Secondary | ICD-10-CM | POA: Diagnosis present

## 2021-12-15 DIAGNOSIS — B182 Chronic viral hepatitis C: Secondary | ICD-10-CM | POA: Diagnosis not present

## 2021-12-15 LAB — COMPREHENSIVE METABOLIC PANEL
ALT: 27 U/L (ref 0–44)
AST: 25 U/L (ref 15–41)
Albumin: 3.2 g/dL — ABNORMAL LOW (ref 3.5–5.0)
Alkaline Phosphatase: 88 U/L (ref 38–126)
Anion gap: 7 (ref 5–15)
BUN: 87 mg/dL — ABNORMAL HIGH (ref 8–23)
CO2: 30 mmol/L (ref 22–32)
Calcium: 9.8 mg/dL (ref 8.9–10.3)
Chloride: 106 mmol/L (ref 98–111)
Creatinine, Ser: 3.16 mg/dL — ABNORMAL HIGH (ref 0.61–1.24)
GFR, Estimated: 21 mL/min — ABNORMAL LOW (ref >60)
Glucose, Bld: 114 mg/dL — ABNORMAL HIGH (ref 70–99)
Potassium: 3.1 mmol/L — ABNORMAL LOW (ref 3.5–5.1)
Sodium: 143 mmol/L (ref 135–145)
Total Bilirubin: 0.4 mg/dL (ref 0.3–1.2)
Total Protein: 7.6 g/dL (ref 6.5–8.1)

## 2021-12-15 LAB — CBC WITH DIFFERENTIAL/PLATELET
Abs Immature Granulocytes: 0.07 10*3/uL (ref 0.00–0.07)
Basophils Absolute: 0.1 10*3/uL (ref 0.0–0.1)
Basophils Relative: 0 %
Eosinophils Absolute: 6.3 10*3/uL — ABNORMAL HIGH (ref 0.0–0.5)
Eosinophils Relative: 26 %
HCT: 30.5 % — ABNORMAL LOW (ref 39.0–52.0)
Hemoglobin: 9.6 g/dL — ABNORMAL LOW (ref 13.0–17.0)
Immature Granulocytes: 0 %
Lymphocytes Relative: 23 %
Lymphs Abs: 5.5 10*3/uL — ABNORMAL HIGH (ref 0.7–4.0)
MCH: 32.4 pg (ref 26.0–34.0)
MCHC: 31.5 g/dL (ref 30.0–36.0)
MCV: 103 fL — ABNORMAL HIGH (ref 80.0–100.0)
Monocytes Absolute: 1.8 10*3/uL — ABNORMAL HIGH (ref 0.1–1.0)
Monocytes Relative: 7 %
Neutro Abs: 10.5 10*3/uL — ABNORMAL HIGH (ref 1.7–7.7)
Neutrophils Relative %: 44 %
Platelets: 247 10*3/uL (ref 150–400)
RBC: 2.96 MIL/uL — ABNORMAL LOW (ref 4.22–5.81)
RDW: 14.3 % (ref 11.5–15.5)
WBC: 24.3 10*3/uL — ABNORMAL HIGH (ref 4.0–10.5)
nRBC: 0 % (ref 0.0–0.2)

## 2021-12-15 LAB — URINALYSIS, ROUTINE W REFLEX MICROSCOPIC
Bacteria, UA: NONE SEEN
Bilirubin Urine: NEGATIVE
Glucose, UA: NEGATIVE mg/dL
Hgb urine dipstick: NEGATIVE
Ketones, ur: NEGATIVE mg/dL
Nitrite: NEGATIVE
Protein, ur: 30 mg/dL — AB
Specific Gravity, Urine: 1.012 (ref 1.005–1.030)
WBC, UA: >50 WBC/hpf — ABNORMAL HIGH (ref 0–5)
pH: 6 (ref 5.0–8.0)

## 2021-12-15 LAB — RAD ONC ARIA SESSION SUMMARY
Course Elapsed Days: 0
Plan Fractions Treated to Date: 1
Plan Prescribed Dose Per Fraction: 2 Gy
Plan Total Fractions Prescribed: 30
Plan Total Prescribed Dose: 60 Gy
Reference Point Dosage Given to Date: 2 Gy
Reference Point Session Dosage Given: 2 Gy
Session Number: 1

## 2021-12-15 LAB — LACTIC ACID, PLASMA
Lactic Acid, Venous: 0.7 mmol/L (ref 0.5–1.9)
Lactic Acid, Venous: 0.7 mmol/L (ref 0.5–1.9)

## 2021-12-15 MED ORDER — PIPERACILLIN-TAZOBACTAM 3.375 G IVPB 30 MIN: 3.3750 g | Freq: Four times a day (QID) | INTRAVENOUS | Status: DC

## 2021-12-15 MED ORDER — ACETAMINOPHEN 650 MG RE SUPP: 650.0000 mg | Freq: Four times a day (QID) | RECTAL | Status: DC | PRN

## 2021-12-15 MED ORDER — CULTURELLE ADULT ULT BALANCE PO CAPS: ORAL_CAPSULE | Freq: Every day | ORAL | Status: DC

## 2021-12-15 MED ORDER — PROSOURCE TF PO LIQD
45.0000 mL | Freq: Two times a day (BID) | ORAL | Status: DC
  Administered 2021-12-15 – 2021-12-21 (×11): 45 mL
  Filled 2021-12-15 (×12): qty 45

## 2021-12-15 MED ORDER — SODIUM CHLORIDE 0.9 % IV SOLN
2.0000 g | INTRAVENOUS | Status: DC
  Administered 2021-12-16 – 2021-12-21 (×6): 2 g via INTRAVENOUS
  Filled 2021-12-15 (×6): qty 12.5

## 2021-12-15 MED ORDER — HYDRALAZINE HCL 50 MG PO TABS
100.0000 mg | ORAL_TABLET | Freq: Three times a day (TID) | ORAL | Status: DC
Start: 2021-12-15 — End: 2021-12-16
  Administered 2021-12-15: 100 mg via ORAL
  Filled 2021-12-15 (×3): qty 2

## 2021-12-15 MED ORDER — ATORVASTATIN CALCIUM 40 MG PO TABS
40.0000 mg | ORAL_TABLET | Freq: Every day | ORAL | Status: DC
  Filled 2021-12-15: qty 1

## 2021-12-15 MED ORDER — MELATONIN 3 MG PO TABS
6.0000 mg | ORAL_TABLET | Freq: Every evening | ORAL | Status: DC | PRN
Start: 2021-12-15 — End: 2021-12-16

## 2021-12-15 MED ORDER — ONDANSETRON HCL 4 MG PO TABS: 4.0000 mg | ORAL_TABLET | Freq: Four times a day (QID) | ORAL | Status: DC | PRN

## 2021-12-15 MED ORDER — POLYETHYLENE GLYCOL 3350 17 G PO PACK
17.0000 g | PACK | Freq: Every day | ORAL | Status: DC | PRN
Start: 2021-12-15 — End: 2021-12-16

## 2021-12-15 MED ORDER — INSULIN GLARGINE-YFGN 100 UNIT/ML ~~LOC~~ SOLN
8.0000 [IU] | Freq: Every day | SUBCUTANEOUS | Status: DC
  Administered 2021-12-16 – 2021-12-21 (×6): 8 [IU] via SUBCUTANEOUS
  Filled 2021-12-15 (×6): qty 0.08

## 2021-12-15 MED ORDER — SODIUM CHLORIDE 0.9 % IV SOLN: 2.0000 g | Freq: Once | INTRAVENOUS | Status: DC

## 2021-12-15 MED ORDER — CHLORHEXIDINE GLUCONATE 0.12 % MT SOLN
15.0000 mL | Freq: Three times a day (TID) | OROMUCOSAL | Status: DC
  Administered 2021-12-15 – 2021-12-21 (×16): 15 mL via OROMUCOSAL
  Filled 2021-12-15 (×19): qty 15

## 2021-12-15 MED ORDER — VANCOMYCIN HCL IN DEXTROSE 1-5 GM/200ML-% IV SOLN
1000.0000 mg | Freq: Once | INTRAVENOUS | Status: AC
  Administered 2021-12-15: 1000 mg via INTRAVENOUS
  Filled 2021-12-15: qty 200

## 2021-12-15 MED ORDER — ONDANSETRON HCL 4 MG/2ML IJ SOLN: 4.0000 mg | Freq: Four times a day (QID) | INTRAMUSCULAR | Status: DC | PRN

## 2021-12-15 MED ORDER — FLORANEX PO PACK
1.0000 g | PACK | Freq: Every day | ORAL | Status: DC
  Administered 2021-12-15 – 2021-12-21 (×6): 1 g
  Filled 2021-12-15 (×7): qty 1

## 2021-12-15 MED ORDER — FERROUS SULFATE 220 (44 FE) MG/5ML PO ELIX
300.0000 mg | ORAL_SOLUTION | Freq: Every day | ORAL | Status: DC
Start: 2021-12-16 — End: 2021-12-16
  Filled 2021-12-15: qty 6.9

## 2021-12-15 MED ORDER — TRAZODONE HCL 100 MG PO TABS: 50.0000 mg | ORAL_TABLET | Freq: Every evening | ORAL | Status: DC | PRN

## 2021-12-15 MED ORDER — ISOSORBIDE DINITRATE 20 MG PO TABS
20.0000 mg | ORAL_TABLET | Freq: Three times a day (TID) | ORAL | Status: DC
  Administered 2021-12-15: 20 mg via ORAL
  Filled 2021-12-15 (×3): qty 1

## 2021-12-15 MED ORDER — TERAZOSIN HCL 2 MG PO CAPS
2.0000 mg | ORAL_CAPSULE | Freq: Every day | ORAL | Status: DC
  Filled 2021-12-15: qty 1

## 2021-12-15 MED ORDER — ENOXAPARIN SODIUM 30 MG/0.3ML IJ SOSY
30.0000 mg | PREFILLED_SYRINGE | INTRAMUSCULAR | Status: DC
  Administered 2021-12-15 – 2021-12-20 (×6): 30 mg via SUBCUTANEOUS
  Filled 2021-12-15 (×6): qty 0.3

## 2021-12-15 MED ORDER — MAGNESIUM HYDROXIDE 400 MG/5ML PO SUSP: 30.0000 mL | Freq: Every day | ORAL | Status: DC | PRN

## 2021-12-15 MED ORDER — LABETALOL HCL 5 MG/ML IV SOLN: 20.0000 mg | INTRAVENOUS | Status: DC | PRN

## 2021-12-15 MED ORDER — CARVEDILOL 12.5 MG PO TABS
25.0000 mg | ORAL_TABLET | Freq: Two times a day (BID) | ORAL | Status: DC
  Administered 2021-12-15: 25 mg via ORAL
  Filled 2021-12-15 (×2): qty 2

## 2021-12-15 MED ORDER — METRONIDAZOLE 500 MG/100ML IV SOLN: 500.0000 mg | Freq: Two times a day (BID) | INTRAVENOUS | Status: DC

## 2021-12-15 MED ORDER — VANCOMYCIN HCL IN DEXTROSE 1-5 GM/200ML-% IV SOLN: 1000.0000 mg | Freq: Once | INTRAVENOUS | Status: DC

## 2021-12-15 MED ORDER — OXYCODONE HCL 5 MG PO TABS
5.0000 mg | ORAL_TABLET | Freq: Four times a day (QID) | ORAL | Status: DC | PRN
  Administered 2021-12-15 – 2021-12-21 (×8): 5 mg
  Filled 2021-12-15 (×9): qty 1

## 2021-12-15 MED ORDER — GABAPENTIN 300 MG PO CAPS
300.0000 mg | ORAL_CAPSULE | Freq: Every day | ORAL | Status: DC
Start: 2021-12-15 — End: 2021-12-21
  Administered 2021-12-15 – 2021-12-20 (×6): 300 mg
  Filled 2021-12-15 (×6): qty 1

## 2021-12-15 MED ORDER — POTASSIUM CHLORIDE IN NACL 20-0.9 MEQ/L-% IV SOLN
INTRAVENOUS | Status: DC
  Filled 2021-12-15 (×6): qty 1000

## 2021-12-15 MED ORDER — NEPRO/CARBSTEADY PO LIQD
237.0000 mL | Freq: Every day | ORAL | Status: DC
Start: 2021-12-15 — End: 2021-12-21
  Administered 2021-12-15 – 2021-12-21 (×24): 237 mL
  Filled 2021-12-15 (×32): qty 237

## 2021-12-15 MED ORDER — PANTOPRAZOLE 2 MG/ML SUSPENSION
40.0000 mg | Freq: Every day | ORAL | Status: DC
Start: 2021-12-16 — End: 2021-12-21
  Administered 2021-12-17 – 2021-12-21 (×4): 40 mg
  Filled 2021-12-15 (×7): qty 20

## 2021-12-15 MED ORDER — ACETAMINOPHEN 325 MG PO TABS
650.0000 mg | ORAL_TABLET | Freq: Four times a day (QID) | ORAL | Status: DC | PRN
  Administered 2021-12-15: 650 mg via ORAL
  Filled 2021-12-15 (×2): qty 2

## 2021-12-15 MED ORDER — PRO-STAT SUGAR FREE PO LIQD
30.0000 mL | Freq: Two times a day (BID) | ORAL | Status: DC
Start: 2021-12-15 — End: 2021-12-15
  Filled 2021-12-15: qty 30

## 2021-12-15 MED ORDER — SENNOSIDES-DOCUSATE SODIUM 8.6-50 MG PO TABS: 2.0000 | ORAL_TABLET | Freq: Every evening | ORAL | Status: DC | PRN

## 2021-12-15 MED ORDER — BASAGLAR KWIKPEN 100 UNIT/ML ~~LOC~~ SOPN
8.0000 [IU] | PEN_INJECTOR | Freq: Every day | SUBCUTANEOUS | Status: DC
Start: 2021-12-15 — End: 2021-12-15

## 2021-12-15 MED ORDER — PIPERACILLIN-TAZOBACTAM 3.375 G IVPB 30 MIN
3.3750 g | Freq: Once | INTRAVENOUS | Status: AC
  Administered 2021-12-15: 3.375 g via INTRAVENOUS
  Filled 2021-12-15: qty 50

## 2021-12-15 MED ORDER — VANCOMYCIN HCL 1250 MG/250ML IV SOLN
1250.0000 mg | INTRAVENOUS | Status: DC
  Administered 2021-12-16 – 2021-12-18 (×2): 1250 mg via INTRAVENOUS
  Filled 2021-12-15 (×2): qty 250

## 2021-12-15 NOTE — Progress Notes (Signed)
A consult was received from an ED physician for vancomycin and Zosyn per pharmacy dosing (for an indication other than meningitis). The patient's profile has been reviewed for ht/wt/allergies/indication/available labs. A one time order has been placed for the above antibiotics.  Further antibiotics/pharmacy consults should be ordered by admitting physician if indicated.                       Reuel Boom, PharmD, BCPS [phone] 12/15/2021, 7:45 PM

## 2021-12-15 NOTE — Assessment & Plan Note (Signed)
-   She has stable renal functions.

## 2021-12-15 NOTE — Assessment & Plan Note (Signed)
-   We will continueHis Coreg and hydralazine as well as Cozaar and terazosin.

## 2021-12-15 NOTE — ED Triage Notes (Signed)
Pt BIB EMS from [facility] pt was sent due to wbc elevation from 13 to 19. Pt denies any symptoms. Hx oral cancer

## 2021-12-15 NOTE — H&P (Signed)
[place]   PATIENT NAME: John Jacobson    MR#:  [id number]  DATE OF BIRTH:  1956/03/30  DATE OF ADMISSION:  12/15/2021  PRIMARY CARE PHYSICIAN: Benay Pike, MD   Patient is coming from: [facility].  REQUESTING/REFERRING PHYSICIAN: Dr. Darl Householder. CHIEF COMPLAINT:   Chief Complaint  Patient presents with   Abnormal Labs    HISTORY OF PRESENT ILLNESS:  John A Nolin Grell. is a 66 y.o. 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  He was recently admitted recently for pneumonia and discharged on 6/29.  He was managed with IV Unasyn and upon discharge he was given IV Rocephin for 7 days via PICC line.  He has been complaining of left oral swelling.  He denies any rhinorrhea or nasal congestion or sore throat.  No cough or wheezing or dyspnea.  No dysuria or urinary frequency or urgency.  No nausea or vomiting or abdominal pain.  ED Course: When he came to the ER, BP was 176/82 with otherwise normal vital signs.  Has been as high as 174/103.  Labs revealed hypokalemia of 3.1 and a BUN of 87 with creatinine of 3.16 better than previous levels on 6/29.  Albumin was 3.2.  CBC showed WBC of 24.319 at his SNF.  He had a history of gout cytosis.  He has anemia at baseline.  Blood cultures were drawn.   Imaging: Portable chest x-ray showed improved lung aeration over the past week with decreased bibasal opacities and mild residual right lower lobe atelectasis with no new abnormalities.  It showed stable cardiomegaly and PICC line projecting over the right axilla.  The patient was given IV vancomycin and Zosyn.  He will be admitted to a medical bed for further evaluation and management. PAST  MEDICAL HISTORY:   Past Medical History:  Diagnosis Date   Anemia    Asthma    Chronic kidney disease    Chronic lower back pain 1995   Lumbar back surgery   Diabetic retinopathy ([place])    PDR OU   DM (diabetes mellitus) type II controlled with renal manifestation ([place])    CKD-4, peripheral neuropathy, PAD   Erectile dysfunction due to diabetes mellitus ([place])    Hepatitis C    Hyperlipidemia associated with type 2 diabetes mellitus ([place])    Hypertensive heart disease with congestive heart failure and chronic kidney disease ([place])    TTE May 2018: Normal LV size and severe LVH-without LVOT gradient/S.A.M.  Normal EF 60 to 65%.?  GR 1 DD.  Mild LA dilation.;  CKD IV  - Cr 3.2   Hypertensive retinopathy    OU   RBBB (right bundle branch block)    Resistant hypertension    Managed by Dr. 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[facility])    Stroke ([place]) 10/2016   Has residual ataxia and partial left-sided hemiparesis    PAST SURGICAL HISTORY:   Past Surgical History:  Procedure Laterality Date   BACK SURGERY  1995   CATARACT EXTRACTION Bilateral    EXCISION MASS NECK N/A 08/06/2021   Procedure: EXCISION OF MANDIBULAR MASS;  Surgeon: Izora Gala, MD;  Location: [place];  Service: ENT;  Laterality: N/A;   EYE SURGERY Bilateral    Cat Sx   PARS PLANA VITRECTOMY Left 01/25/2017   TOE AMPUTATION Right 2016   Right second toe; dry gangrene   TRANSTHORACIC ECHOCARDIOGRAM  10/2016   TTE May 2018: Normal LV size and severe LVH-without LVOT gradient/S.A.M.  Normal EF 60 to 65%.?  GR 1 DD.  Mild LA dilation.    SOCIAL HISTORY:   Social History   Tobacco Use   Smoking status: Former    Packs/day: 0.25    Types: Cigarettes    Quit date: 01/07/2018    Years since quitting: 3.9   Smokeless tobacco: Never  Substance Use Topics   Alcohol use: No    Comment: Few beers every other day hx    FAMILY HISTORY:   Family History  Problem Relation Age of  Onset   Coronary artery disease Mother        MI in her 2s   Diabetes Mother    Macular degeneration Mother    Hypertension Other    Diabetes Other    Alzheimer's disease Other    Coronary artery disease Brother    Diabetes Brother    Diabetes Sister     DRUG ALLERGIES:   Allergies  Allergen Reactions   Amlodipine Besy-Benazepril Hcl Anaphylaxis, Shortness Of Breath and Swelling    Mouth and tongue swelling   Shellfish Allergy Anaphylaxis, Shortness Of Breath and Swelling    REVIEW OF SYSTEMS:   ROS As per history of present illness. All pertinent systems were reviewed above. Constitutional, HEENT, cardiovascular, respiratory, GI, GU, musculoskeletal, neuro, psychiatric, endocrine, integumentary and hematologic systems were reviewed and are otherwise negative/unremarkable except for positive findings mentioned above in the HPI.   MEDICATIONS AT HOME:   Prior to Admission medications   Medication Sig Start Date End Date Taking? Authorizing Provider  acetaminophen (TYLENOL) 500 MG tablet Take 1,000 mg by mouth 2 (two) times daily as needed (for pain or headaches).    [provider]  Amino Acids-Protein Hydrolys (FEEDING SUPPLEMENT, PRO-STAT SUGAR FREE 64,) LIQD Take 30 mLs by mouth 2 (two) times daily.    [provider]  amoxicillin-clavulanate (AUGMENTIN) 250-62.5 MG/5ML suspension Take 10 mLs (500 mg total) by mouth 2 (two) times daily for 10 days. 12/09/21 12/19/21  Barb Merino, MD  aspirin 81 MG EC tablet Take 81 mg by mouth daily. 05/20/21   [provider]  atorvastatin (LIPITOR) 40 MG tablet Take 40 mg by mouth daily. 05/20/21   [provider]  carvedilol (COREG) 25 MG tablet Take 25 mg by mouth every 12 (twelve) hours.    [provider]  chlorhexidine (PERIDEX) 0.12 % solution Use as directed 15 mLs in the mouth or throat 3 (three) times daily.    [provider]  ferrous sulfate 220 (44 Fe) MG/5ML solution Place  300 mg into feeding tube daily.    [provider]  gabapentin (NEURONTIN) 300 MG capsule Place 300 mg into feeding tube at bedtime.    [provider]  Glucose (RA TRUEPLUS GLUCOSE) 15 GM/32ML GEL Give 15 g by tube daily as needed. Low Glucose 11/19/21   [provider]  hydrALAZINE (APRESOLINE) 100 MG tablet Take 1 tablet (100 mg total) by mouth 3 (three) times daily. 11/11/16   Angiulli, Lavon Paganini, PA-C  Insulin Glargine (BASAGLAR KWIKPEN) 100 UNIT/ML Inject 8 Units into the skin daily.    [provider]  insulin lispro (HUMALOG) 100 UNIT/ML injection Inject  1-8 Units into the skin 5 (five) times daily. Sliding Scale: 70-120= 0 units, 121-150= 1 unit,151-200= 2 units, 201-250= 3 units, 251-300= 5 units,301-350= 7 units, 351-400= 8 units, > 400 = 8 units Call MD.    [provider]  isosorbide dinitrate (ISORDIL) 20 MG tablet Take 20 mg by mouth 3 (three) times daily.    [provider]  Lactobacillus-Inulin (CULTURELLE ADULT ULT BALANCE PO) 1 capsule by PEG Tube route daily.    [provider]  melatonin 3 MG TABS tablet Take 6 mg by mouth at bedtime as needed for sleep. 11/19/21   [provider]  Nutritional Supplements (FEEDING SUPPLEMENT, NEPRO CARB STEADY,) LIQD 237 mLs 5 (five) times daily.    [provider]  oxyCODONE (OXY IR/ROXICODONE) 5 MG immediate release tablet Place 5 mg into feeding tube every 6 (six) hours as needed for severe pain.    [provider]  pantoprazole sodium (PROTONIX) 40 mg/20 mL SUSP Place 40 mg into feeding tube daily.    [provider]  polyethylene glycol (MIRALAX / GLYCOLAX) 17 g packet Take 17 g by mouth daily as needed for moderate constipation.    [provider]  senna-docusate (SENOKOT-S) 8.6-50 MG tablet Place 2 tablets into feeding tube at bedtime as needed for mild constipation.    [provider]  terazosin (HYTRIN) 2 MG capsule Take 2 mg by  mouth daily.    [provider]  Water For Irrigation, Sterile (FREE WATER) SOLN Place 200 mLs into feeding tube every 4 (four) hours. 12/09/21   Barb Merino, MD      VITAL SIGNS:  Blood pressure (!) 184/87, pulse 74, temperature 98.6 F (37 C), temperature source Axillary, resp. rate 18, height '5\' 7"'$  (1.702 m), weight 73 kg, SpO2 98 %.  PHYSICAL EXAMINATION:  Physical Exam  GENERAL:  66 y.o.-year-old patient lying in the bed with no acute distress.  EYES: Pupils equal, round, reactive to light and accommodation. No scleral icterus. Extraocular muscles intact.  HEENT: Head atraumatic, normocephalic. Oropharynx shows left mild oral swelling with poor dentition and generalized swelling of his mandible and submandibular area status post reconstruction with no significant tenderness or fluctuation and nasopharynx clear.  NECK:  Supple, no jugular venous distention. No thyroid enlargement, no tenderness.  LUNGS: Normal breath sounds bilaterally, no wheezing, rales,rhonchi or crepitation. No use of accessory muscles of respiration.  CARDIOVASCULAR: Regular rate and rhythm, S1, S2 normal. No murmurs, rubs, or gallops.  ABDOMEN: Soft, nondistended, nontender. Bowel sounds present. No organomegaly or mass.  EXTREMITIES: No pedal edema, cyanosis, or clubbing.  NEUROLOGIC: Cranial nerves II through XII are intact. Muscle strength 5/5 in all extremities. Sensation intact. Gait not checked.  PSYCHIATRIC: The patient is alert and oriented x 3.  Normal affect and good eye contact. SKIN: Infra-auricular wound with clean base mild yellowish discharge on dressing.   LABORATORY PANEL:   CBC Recent Labs  Lab 12/15/21 1826  WBC 24.3*  HGB 9.6*  HCT 30.5*  PLT 247   ------------------------------------------------------------------------------------------------------------------  Chemistries  Recent Labs  Lab 12/15/21 1826  NA 143  K 3.1*  CL 106  CO2 30  GLUCOSE 114*  BUN 87*   CREATININE 3.16*  CALCIUM 9.8  AST 25  ALT 27  ALKPHOS 88  BILITOT 0.4   ------------------------------------------------------------------------------------------------------------------  Cardiac Enzymes No results for input(s): "TROPONINI" in the last 168 hours. ------------------------------------------------------------------------------------------------------------------  RADIOLOGY:  CT Soft Tissue Neck Wo Contrast  Result Date: 12/15/2021 CLINICAL DATA:  History of oral carcinoma. Elevated white blood cell count. EXAM: CT NECK WITHOUT CONTRAST TECHNIQUE: Multidetector CT imaging of the neck was performed following the standard protocol without intravenous contrast. RADIATION DOSE REDUCTION: This exam was performed according to the departmental dose-optimization program which includes automated exposure control, adjustment of the mA and/or kV according to patient size and/or use of iterative reconstruction technique. COMPARISON:  12/07/2021 FINDINGS: Pharynx and larynx: Unchanged postsurgical appearance the oral cavity and floor mouth. No discrete mass collection. Induration of the subcutaneous fat in the submandibular region is unchanged. Salivary glands: Submandibular and left parotid glands are absent. Thyroid: Normal Lymph nodes: Status post bilateral neck dissection. No cervical lymphadenopathy. Vascular: Unremarkable unenhanced appearance of the vessels. Limited intracranial: Normal Visualized orbits: Normal Mastoids and visualized paranasal sinuses: Negative Skeleton: Unchanged postsurgical appearance of the mandible Upper chest: Clear Other: Unchanged appearance of nodes in the left supraclavicular fossa. IMPRESSION: 1. Unchanged postsurgical appearance of the oral cavity and floor of mouth without discrete mass or fluid collection. 2. Unchanged appearance of fat stranding in the submandibular region, which may be post treatment change. 3. No cervical lymphadenopathy. Electronically  Signed   By: Ulyses Jarred M.D.   On: 12/15/2021 19:29   DG Chest Port 1 View  Result Date: 12/15/2021 CLINICAL DATA:  Cough.  Elevated white blood cell count. EXAM: PORTABLE CHEST 1 VIEW COMPARISON:  Radiograph 1 week ago 12/08/2021 FINDINGS: Improved lung aeration. Decreased bibasilar opacities from prior exam, minor residual right lower lobe atelectasis. No new or progressive airspace disease. Cardiomegaly is stable. No pleural effusion or pneumothorax. Left axillary surgical clips. Possible catheter projecting over the right axilla, only partially included in the field of view. IMPRESSION: 1. Improved lung aeration over the past week with decreased bibasilar opacities. Mild residual right lower lobe atelectasis. No new abnormality. 2. Stable cardiomegaly. 3. Possible catheter projecting over the right axilla versus external artifact. Recommend correlation with physical exam. Electronically Signed   By: Keith Rake M.D.   On: 12/15/2021 18:44      IMPRESSION AND PLAN:  Assessment and Plan: * Leukocytosis - We will need to rule out sepsis. - The patient was admitted to a medical bed. - We will continue IV antibiotic therapy with vancomycin and Zosyn. - ENT consult will be obtained. - Dr. Redmond Baseman was notified and is aware about the patient.  Hypokalemia - Potassium will be replaced. - Magnesium level will be checked.  Chronic kidney disease, stage IV (severe) ([place]) - She has stable renal functions.  GERD without esophagitis - We will continue PPI therapy.  Type 2 diabetes mellitus with peripheral neuropathy (HCC) - We will start on supplement coverage with NovoLog. - We will continue his basal coverage. - We will continue his Neurontin.  Dyslipidemia - We will continue statin therapy.  Essential hypertension - We will continueHis Coreg and hydralazine as well as Cozaar and terazosin.  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   DVT  prophylaxis: Subcutaneous heparin. Advanced Care Planning:  Code Status: full code.  Family Communication:  The plan of care was discussed in details with the patient (and family). I answered all questions. The patient agreed to proceed with the above mentioned plan. Further management will depend upon hospital course. Disposition Plan: Back to previous home environment Consults called: ENT consult. All the records are reviewed and case discussed with ED provider.  Status is: Inpatient  At the time of the admission, it appears that the appropriate admission  status for this patient is inpatient.  This is judged to be reasonable and necessary in order to provide the required intensity of service to ensure the patient's safety given the presenting symptoms, physical exam findings and initial radiographic and laboratory data in the context of comorbid conditions.  The patient requires inpatient status due to high intensity of service, high risk of further deterioration and high frequency of surveillance required.  I certify that at the time of admission, it is my clinical judgment that the patient will require inpatient hospital care extending more than 2 midnights.                            Dispo: The patient is from: Home              Anticipated d/c is to: Home              Patient currently is not medically stable to d/c.              Difficult to place patient: No  Christel Mormon M.D on 12/15/2021 at 9:27 PM  [facility]   From 7 PM-7 AM, contact night-coverage [email or web address]  CC: Primary care physician; Benay Pike, MD

## 2021-12-15 NOTE — Assessment & Plan Note (Signed)
-   Potassium will be replaced. - Magnesium level will be checked.

## 2021-12-15 NOTE — Assessment & Plan Note (Signed)
-   We will continue PPI therapy 

## 2021-12-15 NOTE — Assessment & Plan Note (Signed)
-   We will need to rule out sepsis. - The patient was admitted to a medical bed. - We will continue IV antibiotic therapy with vancomycin and Zosyn. - ENT consult will be obtained. - Dr. Redmond Baseman was notified and is aware about the patient.

## 2021-12-15 NOTE — Assessment & Plan Note (Signed)
-   Status post resection and mandibular reconstruction.

## 2021-12-15 NOTE — Progress Notes (Signed)
Pharmacy Antibiotic Note  John Jacobson. is a 66 y.o. male admitted on 12/15/2021 with sepsis.  Pharmacy has been consulted for vancomycin and cefepime dosing. Baseline SCr ~2.8 per Nephro notes. Received Vancomycin + Zosyn x 1 in ED.  Plan: Vancomycin 1000 mg IV now, then 1250 mg IV q48 hr (est AUC 541 based on SCr 3.16; Vd 0.72) Measure vancomycin AUC at steady state as indicated SCr daily while on vanc Cefepime 2g IV q24 hr Flagyl per MD; dosing appropriate   Height: '5\' 7"'$  (170.2 cm) Weight: 73 kg (160 lb 15 oz) IBW/kg (Calculated) : 66.1  Temp (24hrs), Avg:98.6 F (37 C), Min:98.6 F (37 C), Max:98.6 F (37 C)  Recent Labs  Lab 12/09/21 0507 12/15/21 1826  WBC 18.3* 24.3*  CREATININE 4.00* 3.16*  LATICACIDVEN  --  0.7    Estimated Creatinine Clearance: 21.2 mL/min (A) (by C-G formula based on SCr of 3.16 mg/dL (H)).    Allergies  Allergen Reactions   Amlodipine Besy-Benazepril Hcl Anaphylaxis, Shortness Of Breath and Swelling    Mouth and tongue swelling   Shellfish Allergy Anaphylaxis, Shortness Of Breath and Swelling    Antimicrobials this admission: 7/5 Zosyn x 1 7/5 vancomycin >>  7/5 cefepime >>  7/5 flagyl >>   Dose adjustments this admission: N/a  Microbiology results: 7/5 BCx: sent   Thank you for allowing pharmacy to be a part of this patient's care.  Mina Carlisi A 12/15/2021 8:43 PM

## 2021-12-15 NOTE — Assessment & Plan Note (Signed)
-   We will continue statin therapy. 

## 2021-12-15 NOTE — Assessment & Plan Note (Signed)
-   We will start on supplement coverage with NovoLog. - We will continue his basal coverage. - We will continue his Neurontin.

## 2021-12-15 NOTE — ED Provider Notes (Signed)
[facility] DEPT Provider Note   CSN: [id number] Arrival date & time: 12/15/21  1757     History  Chief Complaint  Patient presents with   Abnormal Labs    John A Shyquan Stallbaumer. is a 66 y.o. male.  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 He was discharged from [facility] on 12/09/2021 after hospitalization for pneumonia and AKI. He has been on ceftriaxone through a picc line in his right arm. His speech therapist noticed a sore in his mouth which has had purulent drainage. He denies any pain or other complaints today and states he has been feeling better and better since being discharged from the hospital. He also had his first radiation treatment for his oral cancer.         Home Medications Prior to Admission medications   Medication Sig Start Date End Date Taking? Authorizing Provider  acetaminophen (TYLENOL) 500 MG tablet Place 1,000 mg into feeding tube 2 (two) times daily as needed for mild pain.   Yes [provider]  Amino Acids-Protein Hydrolys (FEEDING SUPPLEMENT, PRO-STAT SUGAR FREE 64,) LIQD Take 30 mLs by mouth 2 (two) times daily.   Yes [provider]  aspirin 81 MG EC tablet Take 81 mg by mouth daily. Per tube 05/20/21  Yes [provider]  atorvastatin (LIPITOR) 40 MG tablet Place 40 mg into feeding tube daily. 05/20/21  Yes [provider]  B Complex-C-Folic Acid (NEPHRO VITAMINS PO) Place 237 mLs into feeding tube See admin instructions. Five times a day   Yes [provider]  carvedilol (COREG) 25 MG tablet Take 25 mg by mouth every 12 (twelve) hours.   Yes [provider]  cefTRIAXone 1 g in dextrose 5 % 50 mL  Inject 1 g into the vein daily. 12/09/21 12/16/21 Yes [provider]  chlorhexidine (PERIDEX) 0.12 % solution Use as directed 15 mLs in the mouth or throat 3 (three) times daily.   Yes [provider]  ferrous sulfate 220 (44 Fe) MG/5ML solution Place 300 mg into feeding tube daily.   Yes [provider]  gabapentin (NEURONTIN) 250 MG/5ML solution Place 300 mg into feeding tube at bedtime.   Yes [provider]  Glucose (RA TRUEPLUS GLUCOSE) 15 GM/32ML GEL Give 15 g by tube daily as needed (low blood sugar). Low Glucose 11/19/21  Yes [provider]  hydrALAZINE (APRESOLINE) 100 MG tablet Take 1 tablet (100 mg total) by mouth 3 (three) times daily. Patient taking differently: Place 100 mg into feeding tube 3 (three) times daily. 11/11/16  Yes Angiulli, Lavon Paganini, PA-C  insulin aspart (NOVOLOG) 100 UNIT/ML injection Inject 2-8 Units into the skin See admin instructions. Check blood sugar 5 times daily. Sliding scale:  141- 180=2 units 181-220=4 units 221-260=6 units 261-300=8 units   Yes [provider]  Insulin Glargine (BASAGLAR KWIKPEN) 100 UNIT/ML Inject 8 Units into the skin daily.   Yes [provider]  isosorbide dinitrate (ISORDIL) 20 MG tablet Place 20 mg into feeding tube 3 (three) times daily.   Yes [provider]  Lactobacillus-Inulin (CULTURELLE ADULT ULT BALANCE PO) 1 capsule by PEG Tube route daily.   Yes [provider]  melatonin 3 MG TABS tablet  Place 6 mg into feeding tube at bedtime as needed for sleep. 11/19/21  Yes [provider]  Nutritional Supplements (FEEDING SUPPLEMENT, NEPRO CARB STEADY,) LIQD 237 mLs by Per J Tube route 5 (five) times daily.   Yes [provider]  nystatin (MYCOSTATIN) 100000 UNIT/ML suspension Take 5 mLs by mouth 3 (three) times daily.   Yes [provider]  oxyCODONE (OXY IR/ROXICODONE) 5 MG immediate release tablet Place 5 mg into feeding tube every 6  (six) hours as needed for severe pain.   Yes [provider]  pantoprazole sodium (PROTONIX) 40 mg/20 mL SUSP Place 40 mg into feeding tube daily.   Yes [provider]  Pollen Extracts (PROSTAT PO) Place 30 mLs into feeding tube 2 (two) times daily. Mix 30 ml with 4 oz of water   Yes [provider]  senna-docusate (SENOKOT-S) 8.6-50 MG tablet Place 2 tablets into feeding tube at bedtime as needed for mild constipation.   Yes [provider]  sodium fluoride (DENTAGEL) 1.1 % GEL dental gel Place 1 Application onto teeth at bedtime.   Yes [provider]  terazosin (HYTRIN) 2 MG capsule Take 2 mg by mouth daily.   Yes [provider]  urea (CARMOL) 40 % CREA Apply 1 Application topically at bedtime. To thick scaly areas of skin   Yes [provider]  Water For Irrigation, Sterile (FREE WATER) SOLN Place 200 mLs into feeding tube every 4 (four) hours. 12/09/21  Yes Barb Merino, MD  amoxicillin-clavulanate (AUGMENTIN) 250-62.5 MG/5ML suspension Take 10 mLs (500 mg total) by mouth 2 (two) times daily for 10 days. Patient not taking: Reported on 12/15/2021 12/09/21 12/19/21  Barb Merino, MD      Allergies    Amlodipine besy-benazepril hcl and Shellfish allergy    Review of Systems   Review of Systems  Constitutional:  Negative for chills and fever.  Eyes:  Negative for visual disturbance.  Respiratory:  Negative for shortness of breath.   Cardiovascular:  Negative for chest pain and leg swelling.  Gastrointestinal:  Positive for diarrhea. Negative for abdominal pain, blood in stool, nausea and vomiting.  Genitourinary:  Negative for difficulty urinating.  Neurological:  Negative for dizziness, weakness and headaches.  Psychiatric/Behavioral:  Negative for decreased concentration.     Physical Exam Updated Vital Signs BP (!) 169/113   Pulse 86   Temp 98.6 F (37 C) (Axillary)   Resp 18   Ht '5\' 7"'$  (1.702 m)   Wt 73 kg   SpO2  99%   BMI 25.21 kg/m  Physical Exam Vitals reviewed.  Constitutional:      General: He is not in acute distress. HENT:     Mouth/Throat:     Comments: Large mandibular mass under the tongue, nontender to palpation.  1/2 x 1 cm pale ulcerated patch with purulent drainage inside the left mandible. Neck:     Comments: Bandaged tracheostomy incision no surrounding erythema or edema.  Wound over left lateral neck subauricular without surrounding edema or erythema. Cardiovascular:     Rate and Rhythm: Normal rate and regular rhythm.  Pulmonary:     Effort: Pulmonary effort is normal.     Breath sounds: Wheezing present.  Neurological:     Mental Status: He is alert and oriented to person, place, and time.     ED Results / Procedures / Treatments   Labs (all labs ordered are listed, but only abnormal results are displayed) Labs Reviewed  CBC WITH  DIFFERENTIAL/PLATELET - Abnormal; Notable for the following components:      Result Value   WBC 24.3 (*)    RBC 2.96 (*)    Hemoglobin 9.6 (*)    HCT 30.5 (*)    MCV 103.0 (*)    Neutro Abs 10.5 (*)    Lymphs Abs 5.5 (*)    Monocytes Absolute 1.8 (*)    Eosinophils Absolute 6.3 (*)    All other components within normal limits  COMPREHENSIVE METABOLIC PANEL - Abnormal; Notable for the following components:   Potassium 3.1 (*)    Glucose, Bld 114 (*)    BUN 87 (*)    Creatinine, Ser 3.16 (*)    Albumin 3.2 (*)    GFR, Estimated 21 (*)    All other components within normal limits  URINALYSIS, ROUTINE W REFLEX MICROSCOPIC - Abnormal; Notable for the following components:   Protein, ur 30 (*)    Leukocytes,Ua LARGE (*)    WBC, UA >50 (*)    All other components within normal limits  CULTURE, BLOOD (ROUTINE X 2)  CULTURE, BLOOD (ROUTINE X 2)  URINE CULTURE  LACTIC ACID, PLASMA  LACTIC ACID, PLASMA  HIV ANTIBODY (ROUTINE TESTING W REFLEX)  PROTIME-INR  CORTISOL-AM, BLOOD  PROCALCITONIN  BASIC METABOLIC PANEL  CBC  PATHOLOGIST  SMEAR REVIEW    EKG None  Radiology CT Soft Tissue Neck Wo Contrast  Result Date: 12/15/2021 CLINICAL DATA:  History of oral carcinoma. Elevated white blood cell count. EXAM: CT NECK WITHOUT CONTRAST TECHNIQUE: Multidetector CT imaging of the neck was performed following the standard protocol without intravenous contrast. RADIATION DOSE REDUCTION: This exam was performed according to the departmental dose-optimization program which includes automated exposure control, adjustment of the mA and/or kV according to patient size and/or use of iterative reconstruction technique. COMPARISON:  12/07/2021 FINDINGS: Pharynx and larynx: Unchanged postsurgical appearance the oral cavity and floor mouth. No discrete mass collection. Induration of the subcutaneous fat in the submandibular region is unchanged. Salivary glands: Submandibular and left parotid glands are absent. Thyroid: Normal Lymph nodes: Status post bilateral neck dissection. No cervical lymphadenopathy. Vascular: Unremarkable unenhanced appearance of the vessels. Limited intracranial: Normal Visualized orbits: Normal Mastoids and visualized paranasal sinuses: Negative Skeleton: Unchanged postsurgical appearance of the mandible Upper chest: Clear Other: Unchanged appearance of nodes in the left supraclavicular fossa. IMPRESSION: 1. Unchanged postsurgical appearance of the oral cavity and floor of mouth without discrete mass or fluid collection. 2. Unchanged appearance of fat stranding in the submandibular region, which may be post treatment change. 3. No cervical lymphadenopathy. Electronically Signed   By: Ulyses Jarred M.D.   On: 12/15/2021 19:29   DG Chest Port 1 View  Result Date: 12/15/2021 CLINICAL DATA:  Cough.  Elevated white blood cell count. EXAM: PORTABLE CHEST 1 VIEW COMPARISON:  Radiograph 1 week ago 12/08/2021 FINDINGS: Improved lung aeration. Decreased bibasilar opacities from prior exam, minor residual right lower lobe atelectasis. No  new or progressive airspace disease. Cardiomegaly is stable. No pleural effusion or pneumothorax. Left axillary surgical clips. Possible catheter projecting over the right axilla, only partially included in the field of view. IMPRESSION: 1. Improved lung aeration over the past week with decreased bibasilar opacities. Mild residual right lower lobe atelectasis. No new abnormality. 2. Stable cardiomegaly. 3. Possible catheter projecting over the right axilla versus external artifact. Recommend correlation with physical exam. Electronically Signed   By: Keith Rake M.D.   On: 12/15/2021 18:44    Procedures Procedures  Medications Ordered in ED Medications  oxyCODONE (Oxy IR/ROXICODONE) immediate release tablet 5 mg (has no administration in time range)  atorvastatin (LIPITOR) tablet 40 mg (has no administration in time range)  carvedilol (COREG) tablet 25 mg (has no administration in time range)  hydrALAZINE (APRESOLINE) tablet 100 mg (has no administration in time range)  isosorbide dinitrate (ISORDIL) tablet 20 mg (has no administration in time range)  terazosin (HYTRIN) capsule 2 mg (has no administration in time range)  pantoprazole sodium (PROTONIX) 40 mg/20 mL oral suspension 40 mg (has no administration in time range)  polyethylene glycol (MIRALAX / GLYCOLAX) packet 17 g (has no administration in time range)  senna-docusate (Senokot-S) tablet 2 tablet (has no administration in time range)  melatonin tablet 6 mg (has no administration in time range)  ferrous sulfate 220 (44 Fe) MG/5ML solution 300 mg (has no administration in time range)  gabapentin (NEURONTIN) capsule 300 mg (has no administration in time range)  feeding supplement (NEPRO CARB STEADY) liquid 237 mL (has no administration in time range)  chlorhexidine (PERIDEX) 0.12 % solution 15 mL (has no administration in time range)  enoxaparin (LOVENOX) injection 30 mg (has no administration in time range)  0.9 % NaCl with KCl  20 mEq/ L  infusion (has no administration in time range)  acetaminophen (TYLENOL) tablet 650 mg (has no administration in time range)    Or  acetaminophen (TYLENOL) suppository 650 mg (has no administration in time range)  traZODone (DESYREL) tablet 50 mg (has no administration in time range)  ondansetron (ZOFRAN) tablet 4 mg (has no administration in time range)    Or  ondansetron (ZOFRAN) injection 4 mg (has no administration in time range)  magnesium hydroxide (MILK OF MAGNESIA) suspension 30 mL (has no administration in time range)  ceFEPIme (MAXIPIME) 2 g in sodium chloride 0.9 % 100 mL IVPB (has no administration in time range)  vancomycin (VANCOREADY) IVPB 1250 mg/250 mL (has no administration in time range)  labetalol (NORMODYNE) injection 20 mg (has no administration in time range)  insulin glargine-yfgn (SEMGLEE) injection 8 Units (has no administration in time range)  lactobacillus (FLORANEX/LACTINEX) granules 1 g (has no administration in time range)  feeding supplement (PROSource TF) liquid 45 mL (has no administration in time range)  vancomycin (VANCOCIN) IVPB 1000 mg/200 mL premix (1,000 mg Intravenous New Bag/Given 12/15/21 2040)  piperacillin-tazobactam (ZOSYN) IVPB 3.375 g (3.375 g Intravenous New Bag/Given 12/15/21 2010)    ED Course/ Medical Decision Making/ A&P                           Medical Decision Making John Drue Harr. is a 66 year old male with history of renal cell carcinoma status post osteocutaneous scapula free flap reconstruction of mandible, left SCM rotation flap in 09/2021 presenting with elevated white blood cell count despite being on IV Rocephin for pneumonia following discharge from [facility] on 12/09/2021.   6:30 PM  Differential includes soft tissue infection, cellulitis, or abscess.  I reviewed the patient's labs and imaging studies.  Work-up included CBC, CMP, lactic acid, urinalysis, urine culture, blood culture, and, noncontrast CT  scan of the neck.  White blood cell count found to be again elevated at over 24,000 with eosinophilia.  UA revealed large amount of leukocytes, protein and over 50 white blood cells seen under microscope but without any bacteria seen.  There is no drainable abscess noted on noncontrast CT of the neck. Patient with worsening  leukocytosis despite IV antibiotic therapy is worrisome for infection and site that is on reached by antibiotic or lack of coverage infection.  Patient will need to be admitted. ENT was consulted and plan to round on the patient here at [facility] long tomorrow.  We broadened his antibiotic coverage to vancomycin and Zosyn.  Patient will be admitted for further management and evaluation.  Problems Addressed: Bacteremia: acute illness or injury Leukocytosis, unspecified type: acute illness or injury Oral cancer [facility]): chronic illness or injury  Amount and/or Complexity of Data Reviewed Labs: ordered. Decision-making details documented in ED Course. Radiology: ordered and independent interpretation performed. Decision-making details documented in ED Course.  Risk Prescription drug management. Decision regarding hospitalization.          Final Clinical Impression(s) / ED Diagnoses Final diagnoses:  Leukocytosis, unspecified type  Bacteremia  Oral cancer [facility])    Rx / DC Orders ED Discharge Orders     None         John Blamer, DO 12/15/21 2235    Drenda Freeze, MD 12/18/21 [phone]

## 2021-12-16 ENCOUNTER — Ambulatory Visit: Payer: No Typology Code available for payment source

## 2021-12-16 ENCOUNTER — Telehealth: Payer: Self-pay | Admitting: Radiation Oncology

## 2021-12-16 DIAGNOSIS — D638 Anemia in other chronic diseases classified elsewhere: Secondary | ICD-10-CM | POA: Diagnosis not present

## 2021-12-16 DIAGNOSIS — D72825 Bandemia: Secondary | ICD-10-CM | POA: Diagnosis not present

## 2021-12-16 DIAGNOSIS — D721 Eosinophilia, unspecified: Secondary | ICD-10-CM

## 2021-12-16 DIAGNOSIS — D72829 Elevated white blood cell count, unspecified: Secondary | ICD-10-CM | POA: Diagnosis not present

## 2021-12-16 DIAGNOSIS — E1121 Type 2 diabetes mellitus with diabetic nephropathy: Secondary | ICD-10-CM

## 2021-12-16 DIAGNOSIS — K219 Gastro-esophageal reflux disease without esophagitis: Secondary | ICD-10-CM

## 2021-12-16 DIAGNOSIS — Z794 Long term (current) use of insulin: Secondary | ICD-10-CM

## 2021-12-16 DIAGNOSIS — C411 Malignant neoplasm of mandible: Secondary | ICD-10-CM

## 2021-12-16 DIAGNOSIS — I739 Peripheral vascular disease, unspecified: Secondary | ICD-10-CM

## 2021-12-16 DIAGNOSIS — Z8673 Personal history of transient ischemic attack (TIA), and cerebral infarction without residual deficits: Secondary | ICD-10-CM

## 2021-12-16 DIAGNOSIS — R1312 Dysphagia, oropharyngeal phase: Secondary | ICD-10-CM

## 2021-12-16 LAB — URINE CULTURE: Culture: NO GROWTH

## 2021-12-16 LAB — CBC
HCT: 28.3 % — ABNORMAL LOW (ref 39.0–52.0)
Hemoglobin: 8.9 g/dL — ABNORMAL LOW (ref 13.0–17.0)
MCH: 32.5 pg (ref 26.0–34.0)
MCHC: 31.4 g/dL (ref 30.0–36.0)
MCV: 103.3 fL — ABNORMAL HIGH (ref 80.0–100.0)
Platelets: 227 10*3/uL (ref 150–400)
RBC: 2.74 MIL/uL — ABNORMAL LOW (ref 4.22–5.81)
RDW: 14.4 % (ref 11.5–15.5)
WBC: 22.1 10*3/uL — ABNORMAL HIGH (ref 4.0–10.5)
nRBC: 0 % (ref 0.0–0.2)

## 2021-12-16 LAB — BASIC METABOLIC PANEL
Anion gap: 7 (ref 5–15)
BUN: 80 mg/dL — ABNORMAL HIGH (ref 8–23)
CO2: 31 mmol/L (ref 22–32)
Calcium: 9.3 mg/dL (ref 8.9–10.3)
Chloride: 106 mmol/L (ref 98–111)
Creatinine, Ser: 2.91 mg/dL — ABNORMAL HIGH (ref 0.61–1.24)
GFR, Estimated: 23 mL/min — ABNORMAL LOW (ref >60)
Glucose, Bld: 138 mg/dL — ABNORMAL HIGH (ref 70–99)
Potassium: 3.1 mmol/L — ABNORMAL LOW (ref 3.5–5.1)
Sodium: 144 mmol/L (ref 135–145)

## 2021-12-16 LAB — PROTIME-INR
INR: 1.2 (ref 0.8–1.2)
Prothrombin Time: 15.1 seconds (ref 11.4–15.2)

## 2021-12-16 LAB — PROCALCITONIN: Procalcitonin: <0.1 ng/mL

## 2021-12-16 LAB — CORTISOL-AM, BLOOD: Cortisol - AM: 5.2 ug/dL — ABNORMAL LOW (ref 6.7–22.6)

## 2021-12-16 LAB — MAGNESIUM: Magnesium: 2.1 mg/dL (ref 1.7–2.4)

## 2021-12-16 LAB — GLUCOSE, CAPILLARY
Glucose-Capillary: 121 mg/dL — ABNORMAL HIGH (ref 70–99)
Glucose-Capillary: 71 mg/dL (ref 70–99)

## 2021-12-16 LAB — CBG MONITORING, ED: Glucose-Capillary: 136 mg/dL — ABNORMAL HIGH (ref 70–99)

## 2021-12-16 MED ORDER — HYDRALAZINE HCL 50 MG PO TABS
100.0000 mg | ORAL_TABLET | Freq: Three times a day (TID) | ORAL | Status: DC
  Filled 2021-12-16: qty 2

## 2021-12-16 MED ORDER — ISOSORBIDE DINITRATE 20 MG PO TABS
20.0000 mg | ORAL_TABLET | Freq: Three times a day (TID) | ORAL | Status: DC
  Administered 2021-12-16 – 2021-12-20 (×11): 20 mg
  Filled 2021-12-16 (×14): qty 1

## 2021-12-16 MED ORDER — ACETAMINOPHEN 650 MG RE SUPP
650.0000 mg | Freq: Four times a day (QID) | RECTAL | Status: DC | PRN
Start: 2021-12-16 — End: 2021-12-21

## 2021-12-16 MED ORDER — INSULIN ASPART 100 UNIT/ML IJ SOLN
0.0000 [IU] | Freq: Three times a day (TID) | INTRAMUSCULAR | Status: DC
  Administered 2021-12-16 (×2): 2 [IU] via SUBCUTANEOUS
  Administered 2021-12-17 – 2021-12-18 (×3): 3 [IU] via SUBCUTANEOUS
  Administered 2021-12-18 – 2021-12-20 (×5): 2 [IU] via SUBCUTANEOUS
  Administered 2021-12-20 (×2): 3 [IU] via SUBCUTANEOUS
  Administered 2021-12-21: 2 [IU] via SUBCUTANEOUS
  Administered 2021-12-21: 3 [IU] via SUBCUTANEOUS
  Filled 2021-12-16: qty 0.15

## 2021-12-16 MED ORDER — FERROUS SULFATE 300 (60 FE) MG/5ML PO SYRP
300.0000 mg | ORAL_SOLUTION | Freq: Every day | ORAL | Status: DC
  Administered 2021-12-17 – 2021-12-21 (×5): 300 mg
  Filled 2021-12-16 (×6): qty 5

## 2021-12-16 MED ORDER — TERAZOSIN HCL 2 MG PO CAPS
2.0000 mg | ORAL_CAPSULE | Freq: Every day | ORAL | Status: DC
  Administered 2021-12-17 – 2021-12-21 (×5): 2 mg
  Filled 2021-12-16 (×5): qty 1

## 2021-12-16 MED ORDER — MELATONIN 3 MG PO TABS: 6.0000 mg | ORAL_TABLET | Freq: Every evening | ORAL | Status: DC | PRN

## 2021-12-16 MED ORDER — ONDANSETRON HCL 4 MG/2ML IJ SOLN: 4.0000 mg | Freq: Four times a day (QID) | INTRAMUSCULAR | Status: DC | PRN

## 2021-12-16 MED ORDER — ONDANSETRON HCL 4 MG PO TABS: 4.0000 mg | ORAL_TABLET | Freq: Four times a day (QID) | ORAL | Status: DC | PRN

## 2021-12-16 MED ORDER — TRAZODONE HCL 50 MG PO TABS: 50.0000 mg | ORAL_TABLET | Freq: Every evening | ORAL | Status: DC | PRN

## 2021-12-16 MED ORDER — ATORVASTATIN CALCIUM 40 MG PO TABS
40.0000 mg | ORAL_TABLET | Freq: Every day | ORAL | Status: DC
  Administered 2021-12-17 – 2021-12-21 (×5): 40 mg
  Filled 2021-12-16 (×5): qty 1

## 2021-12-16 MED ORDER — ACETAMINOPHEN 160 MG/5ML PO SOLN
650.0000 mg | Freq: Four times a day (QID) | ORAL | Status: DC | PRN
  Administered 2021-12-17 – 2021-12-21 (×6): 650 mg via ORAL
  Filled 2021-12-16 (×6): qty 20.3

## 2021-12-16 MED ORDER — INSULIN ASPART 100 UNIT/ML IJ SOLN
0.0000 [IU] | Freq: Every day | INTRAMUSCULAR | Status: DC
  Administered 2021-12-18: 2 [IU] via SUBCUTANEOUS
  Filled 2021-12-16: qty 0.05

## 2021-12-16 MED ORDER — CARVEDILOL 25 MG PO TABS
25.0000 mg | ORAL_TABLET | Freq: Two times a day (BID) | ORAL | Status: DC
Start: 2021-12-16 — End: 2021-12-21
  Administered 2021-12-16 – 2021-12-21 (×10): 25 mg
  Filled 2021-12-16 (×10): qty 1

## 2021-12-16 MED ORDER — POTASSIUM CHLORIDE 20 MEQ PO PACK
40.0000 meq | PACK | ORAL | Status: AC
  Administered 2021-12-16: 40 meq
  Filled 2021-12-16: qty 2

## 2021-12-16 MED ORDER — POLYETHYLENE GLYCOL 3350 17 G PO PACK: 17.0000 g | PACK | Freq: Every day | ORAL | Status: DC | PRN

## 2021-12-16 MED ORDER — HYDRALAZINE HCL 50 MG PO TABS
100.0000 mg | ORAL_TABLET | Freq: Three times a day (TID) | ORAL | Status: DC
  Administered 2021-12-16 – 2021-12-21 (×14): 100 mg
  Filled 2021-12-16 (×15): qty 2

## 2021-12-16 NOTE — Telephone Encounter (Signed)
Received a call from Leda Gauze from [facility], stated patient is hospitalized and will not be able to make his [facility] 7/6. L4 notified.

## 2021-12-16 NOTE — ED Notes (Signed)
Assessed tube for residual as well as ability to flush.  Initially PEG tube flushed 10 ml's of water w/o difficulty. Medications crushed and mixed w/ water unable to get contents into PEG tube. Attempted multiple times to unclog PEG w/o success. Medications wasted, pharmacy contacted who advised contacting dietary as they have a protocol. Pt did NOT receive 1000 medications aside from insulin. Will notify MD.

## 2021-12-16 NOTE — Progress Notes (Signed)
PROGRESS NOTE  John Jacobson LYY:[id number] DOB: 23-Aug-1955   PCP: Benay Pike, MD  Patient is from: SNF  DOA: 12/15/2021 LOS: 1  Chief complaints Chief Complaint  Patient presents with   Abnormal Labs     Brief Narrative / Interim history: 66 year old M with PMH of stage IVA mandibular SCC, DM-2, CKD-4, CVA, HTN, dysphagia with tube feed dependent and recent hospitalization from 6/27-6/29 for bilateral aspiration pneumonia and AKI when he left AMA to go to his outpatient appointments returning from SNF for leukocytosis and left oral swelling.  Patient was on IV ceftriaxone via PICC line.   In ED, slightly hypertensive.  K3.1. Cr 3.16 (4.0 on 6/29).  BUN 87.  WBC 24.3 with eosinophilia and some left shift.  CXR with improved lung aeration, stable cardiomegaly and PICC line projecting over the right axilla.  Cultures obtained.  Started on IV vancomycin and Zosyn and admitted for leukocytosis.  ENT consulted.  Subjective: Seen and examined earlier this morning.  No major events overnight of this morning.  No complaints  Objective: Vitals:   12/16/21 0400 12/16/21 0500 12/16/21 0600 12/16/21 0800  BP: (!) 143/72 (!) 146/68 (!) 173/86 (!) 166/85  Pulse: 66 65 76 70  Resp:  18  16  Temp:      TempSrc:      SpO2: 97% 99% 95% 97%  Weight:      Height:        Examination:  GENERAL: No apparent distress.  Nontoxic. HEENT: MMM.  Left mandibular swelling.  Surgical wound under his left ear.  Small purulent drainage on dressing. NECK: As above RESP:  No IWOB.  Fair aeration bilaterally. CVS:  RRR. Heart sounds normal.  ABD/GI/GU: BS+. Abd soft, NTND.  G-tube in place. MSK/EXT:  Moves extremities. No apparent deformity. No edema.  SKIN: no apparent skin lesion or wound NEURO: Awake, alert and oriented appropriately.  No apparent focal neuro deficit. PSYCH: Calm. Normal affect.   Procedures:  None  Microbiology summarized: Urine culture pending Blood culture  pending  Assessment and plan: Principal Problem:   Leukocytosis Active Problems:   Hypokalemia   Chronic kidney disease, stage IV (severe) (HCC)   DM (diabetes mellitus), type 2 with renal complications (HCC)   History of stroke   PAD (peripheral artery disease) (HCC)   Cancer of mandible (HCC)   Essential hypertension   Dyslipidemia   GERD without esophagitis   Oropharyngeal dysphagia   Anemia of chronic disease   Eosinophilia   Bandemia  Leukocytosis with eosinophilia and some degree of bandemia: Unclear source but it is not all acute.  He had WBC of 18.3 when he left AGAINST MEDICAL ADVICE on 6/29.  Differential shows significant eosinophilia.  He has no respiratory symptoms.  Improved aeration on chest x-ray.  He was on IV ceftriaxone via PICC line at SNF.  He has significant left mandibular swelling with a small purulent looking material on dressing.  No fluctuance or erythema.  Pro-Cal and lactic acid negative. -Continue broad-spectrum antibiotics pending cultures -ENT notified by admitting provider  CKD-4/azotemia: Improving. Recent Labs    08/03/21 1019 08/06/21 1017 09/14/21 0850 12/07/21 2241 12/08/21 0351 12/08/21 1416 12/09/21 0507 12/15/21 1826 12/16/21 0548  BUN 83* 111* 35* 135* 122* 112* 113* 87* 80*  CREATININE 5.40* 5.50* 2.64* 4.11* 4.08* 4.28* 4.00* 3.16* 2.91*  -Continue IV fluid -Avoid nephrotoxic meds  Stage IV left mandibular cancer: Had reconstruction surgery.  Followed by oncology and radiation oncology. -Notified radiation  oncology -Had primary oncologist to the team.  IDDM-2 with CKD-4, PAD and other complications -Started sliding scale insulin.  Anemia of chronic disease: Relatively stable. Recent Labs    08/03/21 1019 08/06/21 1017 09/14/21 0850 12/07/21 2241 12/09/21 0507 12/15/21 1826 12/16/21 0548  HGB 9.9* 10.2* 9.3* 9.8* 8.9* 9.6* 8.9*  -Continue monitoring  Hypokalemia: K3.1 -K-Dur 40x2 per tube -Check magnesium    GERD without esophagitis -Continue PPI therapy.  Dyslipidemia -Continue statin   Essential hypertension -Continue home meds  History of CVA Physical deconditioning -PT/OT eval  Oropharyngeal dysphagia -RD consulted for tube feed initiation  Body mass index is 25.21 kg/m.           DVT prophylaxis:  enoxaparin (LOVENOX) injection 30 mg Start: 12/15/21 2200  Code Status: Full code Family Communication: None at bedside Level of care: Med-Surg Status is: Inpatient Remains inpatient appropriate because: Due to leukocytosis   Final disposition: Likely back to SNF once medically cleared Consultants:  Oncology Radiation oncology ENT  Sch Meds:  Scheduled Meds:  atorvastatin  40 mg Oral Daily   carvedilol  25 mg Oral Q12H   chlorhexidine  15 mL Mouth/Throat TID   enoxaparin (LOVENOX) injection  30 mg Subcutaneous Q24H   feeding supplement (NEPRO CARB STEADY)  237 mL Per Tube 5 X Daily   feeding supplement (PROSource TF)  45 mL Per Tube BID   ferrous sulfate  300 mg Per Tube Daily   gabapentin  300 mg Per Tube QHS   hydrALAZINE  100 mg Oral TID   insulin aspart  0-15 Units Subcutaneous TID WC   insulin aspart  0-5 Units Subcutaneous QHS   insulin glargine-yfgn  8 Units Subcutaneous Daily   isosorbide dinitrate  20 mg Oral TID   lactobacillus  1 g Per Tube Daily   pantoprazole sodium  40 mg Per Tube Daily   potassium chloride  40 mEq Per Tube Q4H   terazosin  2 mg Oral Daily   Continuous Infusions:  0.9 % NaCl with KCl 20 mEq / L 100 mL/hr at 12/15/21 2301   ceFEPime (MAXIPIME) IV 2 g (12/16/21 0629)   vancomycin     PRN Meds:.acetaminophen **OR** acetaminophen, labetalol, magnesium hydroxide, melatonin, ondansetron **OR** ondansetron (ZOFRAN) IV, oxyCODONE, polyethylene glycol, senna-docusate, traZODone  Antimicrobials: Anti-infectives (From admission, onward)    Start     Dose/Rate Route Frequency Ordered Stop   12/16/21 2200  vancomycin (VANCOREADY)  IVPB 1250 mg/250 mL        1,250 mg 166.7 mL/hr over 90 Minutes Intravenous Every 48 hours 12/15/21 2041     12/16/21 0600  ceFEPIme (MAXIPIME) 2 g in sodium chloride 0.9 % 100 mL IVPB        2 g 200 mL/hr over 30 Minutes Intravenous Every 24 hours 12/15/21 2041     12/15/21 2045  piperacillin-tazobactam (ZOSYN) IVPB 3.375 g  Status:  Discontinued        3.375 g 100 mL/hr over 30 Minutes Intravenous Every 6 hours 12/15/21 2041 12/15/21 2042   12/15/21 2030  ceFEPIme (MAXIPIME) 2 g in sodium chloride 0.9 % 100 mL IVPB  Status:  Discontinued        2 g 200 mL/hr over 30 Minutes Intravenous  Once 12/15/21 2020 12/15/21 2041   12/15/21 2030  metroNIDAZOLE (FLAGYL) IVPB 500 mg  Status:  Discontinued        500 mg 100 mL/hr over 60 Minutes Intravenous Every 12 hours 12/15/21 2020 12/15/21 2041  12/15/21 2030  vancomycin (VANCOCIN) IVPB 1000 mg/200 mL premix  Status:  Discontinued        1,000 mg 200 mL/hr over 60 Minutes Intravenous  Once 12/15/21 2020 12/15/21 2041   12/15/21 1945  vancomycin (VANCOCIN) IVPB 1000 mg/200 mL premix        1,000 mg 200 mL/hr over 60 Minutes Intravenous  Once 12/15/21 1944 12/15/21 2254   12/15/21 1945  piperacillin-tazobactam (ZOSYN) IVPB 3.375 g        3.375 g 100 mL/hr over 30 Minutes Intravenous  Once 12/15/21 1944 12/15/21 2254        I have personally reviewed the following labs and images: CBC: Recent Labs  Lab 12/15/21 1826 12/16/21 0548  WBC 24.3* 22.1*  NEUTROABS 10.5*  --   HGB 9.6* 8.9*  HCT 30.5* 28.3*  MCV 103.0* 103.3*  PLT 247 227   BMP &GFR Recent Labs  Lab 12/15/21 1826 12/16/21 0548  NA 143 144  K 3.1* 3.1*  CL 106 106  CO2 30 31  GLUCOSE 114* 138*  BUN 87* 80*  CREATININE 3.16* 2.91*  CALCIUM 9.8 9.3   Estimated Creatinine Clearance: 23 mL/min (A) (by C-G formula based on SCr of 2.91 mg/dL (H)). Liver & Pancreas: Recent Labs  Lab 12/15/21 1826  AST 25  ALT 27  ALKPHOS 88  BILITOT 0.4  PROT 7.6  ALBUMIN  3.2*   No results for input(s): "LIPASE", "AMYLASE" in the last 168 hours. No results for input(s): "AMMONIA" in the last 168 hours. Diabetic: No results for input(s): "HGBA1C" in the last 72 hours. No results for input(s): "GLUCAP" in the last 168 hours. Cardiac Enzymes: No results for input(s): "CKTOTAL", "CKMB", "CKMBINDEX", "TROPONINI" in the last 168 hours. No results for input(s): "PROBNP" in the last 8760 hours. Coagulation Profile: Recent Labs  Lab 12/16/21 0548  INR 1.2   Thyroid Function Tests: No results for input(s): "TSH", "T4TOTAL", "FREET4", "T3FREE", "THYROIDAB" in the last 72 hours. Lipid Profile: No results for input(s): "CHOL", "HDL", "LDLCALC", "TRIG", "CHOLHDL", "LDLDIRECT" in the last 72 hours. Anemia Panel: No results for input(s): "VITAMINB12", "FOLATE", "FERRITIN", "TIBC", "IRON", "RETICCTPCT" in the last 72 hours. Urine analysis:    Component Value Date/Time   COLORURINE YELLOW 12/15/2021 2051   APPEARANCEUR CLEAR 12/15/2021 2051   LABSPEC 1.012 12/15/2021 2051   PHURINE 6.0 12/15/2021 2051   GLUCOSEU NEGATIVE 12/15/2021 2051   HGBUR NEGATIVE 12/15/2021 2051   [place] NEGATIVE 12/15/2021 2051   [place] NEGATIVE 12/15/2021 2051   PROTEINUR 30 (A) 12/15/2021 2051   NITRITE NEGATIVE 12/15/2021 2051   LEUKOCYTESUR LARGE (A) 12/15/2021 2051   Sepsis Labs: Invalid input(s): "PROCALCITONIN", "LACTICIDVEN"  Microbiology: No results found for this or any previous visit (from the past 240 hour(s)).  Radiology Studies: CT Soft Tissue Neck Wo Contrast  Result Date: 12/15/2021 CLINICAL DATA:  History of oral carcinoma. Elevated white blood cell count. EXAM: CT NECK WITHOUT CONTRAST TECHNIQUE: Multidetector CT imaging of the neck was performed following the standard protocol without intravenous contrast. RADIATION DOSE REDUCTION: This exam was performed according to the departmental dose-optimization program which includes automated exposure control,  adjustment of the mA and/or kV according to patient size and/or use of iterative reconstruction technique. COMPARISON:  12/07/2021 FINDINGS: Pharynx and larynx: Unchanged postsurgical appearance the oral cavity and floor mouth. No discrete mass collection. Induration of the subcutaneous fat in the submandibular region is unchanged. Salivary glands: Submandibular and left parotid glands are absent. Thyroid: Normal Lymph nodes: Status post  bilateral neck dissection. No cervical lymphadenopathy. Vascular: Unremarkable unenhanced appearance of the vessels. Limited intracranial: Normal Visualized orbits: Normal Mastoids and visualized paranasal sinuses: Negative Skeleton: Unchanged postsurgical appearance of the mandible Upper chest: Clear Other: Unchanged appearance of nodes in the left supraclavicular fossa. IMPRESSION: 1. Unchanged postsurgical appearance of the oral cavity and floor of mouth without discrete mass or fluid collection. 2. Unchanged appearance of fat stranding in the submandibular region, which may be post treatment change. 3. No cervical lymphadenopathy. Electronically Signed   By: Ulyses Jarred M.D.   On: 12/15/2021 19:29   DG Chest Port 1 View  Result Date: 12/15/2021 CLINICAL DATA:  Cough.  Elevated white blood cell count. EXAM: PORTABLE CHEST 1 VIEW COMPARISON:  Radiograph 1 week ago 12/08/2021 FINDINGS: Improved lung aeration. Decreased bibasilar opacities from prior exam, minor residual right lower lobe atelectasis. No new or progressive airspace disease. Cardiomegaly is stable. No pleural effusion or pneumothorax. Left axillary surgical clips. Possible catheter projecting over the right axilla, only partially included in the field of view. IMPRESSION: 1. Improved lung aeration over the past week with decreased bibasilar opacities. Mild residual right lower lobe atelectasis. No new abnormality. 2. Stable cardiomegaly. 3. Possible catheter projecting over the right axilla versus external  artifact. Recommend correlation with physical exam. Electronically Signed   By: Keith Rake M.D.   On: 12/15/2021 18:44      Melea Prezioso T. Marcus  If 7PM-7AM, please contact night-coverage [email or web address] 12/16/2021, 10:51 AM

## 2021-12-16 NOTE — Progress Notes (Signed)
Subjective: Patient of Dr. Constance Holster who underwent radical resection of oral cancer with free flap reconstruction at [facility] in late April.  He was seen at [facility] last week with no concerns about his flap.  He was recently treated for pneumonia with Unasyn and then Augmentin.  He has been in a skilled nursing facility who has been monitoring his white blood count that has been rising.  Thus, he was hospitalized today.  Objective: Vital signs in last 24 hours: Temp:  [97.7 F (36.5 C)-98.5 F (36.9 C)] 97.7 F (36.5 C) (07/06 1653) Pulse Rate:  [65-92] 78 (07/06 1653) Resp:  [16-18] 18 (07/06 1653) BP: (136-194)/(68-131) 194/104 (07/06 1653) SpO2:  [95 %-100 %] 97 % (07/06 1653) Wt Readings from Last 1 Encounters:  12/15/21 73 kg    Intake/Output from previous day: No intake/output data recorded. Intake/Output this shift: Total I/O In: 1906.3 [I.V.:1706.8; Other:100; IV Piggyback:99.6] Out: 0   General appearance: alert, cooperative, and no distress Throat: bulky soft tissue flap over mandible centrally and to the left, granulated area along the left edge of the flap with pus easily expressed with massage of the left mandible region Neck: left neck incision with shallow open area laterally  Recent Labs    12/15/21 1826 12/16/21 0548  WBC 24.3* 22.1*  HGB 9.6* 8.9*  HCT 30.5* 28.3*  PLT 247 227    Recent Labs    12/15/21 1826 12/16/21 0548  NA 143 144  K 3.1* 3.1*  CL 106 106  CO2 30 31  GLUCOSE 114* 138*  BUN 87* 80*  CREATININE 3.16* 2.91*  CALCIUM 9.8 9.3    Medications: I have reviewed the patient's current medications.  Assessment/Plan: Oral cancer s/p radical resection and scapula free flap reconstruction in April now admitted with leukocytosis with purulent drainage expressible from the lateral flap  I personally reviewed his maxillofacial CT without clear signs of abscess or infection.  That said, on exam, there is an open and granulated area to the  left of the flap in the mouth where pus is readily expressed.  Will send culture.  On cefipime.  I will contact Dr. Conley Canal or [place] tomorrow to report findings and seek their input.   LOS: 1 day   Melida Quitter 12/16/2021, 6:34 PM

## 2021-12-17 ENCOUNTER — Inpatient Hospital Stay: Payer: Non-veteran care | Admitting: Dietician

## 2021-12-17 ENCOUNTER — Other Ambulatory Visit: Payer: Self-pay

## 2021-12-17 ENCOUNTER — Ambulatory Visit
Admission: RE | Admit: 2021-12-17 | Discharge: 2021-12-17 | Disposition: A | Discharge status: Home / Self Care | Payer: No Typology Code available for payment source | Source: Ambulatory Visit | Attending: Radiation Oncology | Admitting: Radiation Oncology

## 2021-12-17 DIAGNOSIS — E440 Moderate protein-calorie malnutrition: Secondary | ICD-10-CM

## 2021-12-17 DIAGNOSIS — D638 Anemia in other chronic diseases classified elsewhere: Secondary | ICD-10-CM | POA: Diagnosis not present

## 2021-12-17 DIAGNOSIS — C411 Malignant neoplasm of mandible: Secondary | ICD-10-CM | POA: Diagnosis not present

## 2021-12-17 DIAGNOSIS — D72829 Elevated white blood cell count, unspecified: Secondary | ICD-10-CM | POA: Diagnosis not present

## 2021-12-17 DIAGNOSIS — D72825 Bandemia: Secondary | ICD-10-CM | POA: Diagnosis not present

## 2021-12-17 LAB — CBC WITH DIFFERENTIAL/PLATELET
Abs Immature Granulocytes: 0.07 10*3/uL (ref 0.00–0.07)
Basophils Absolute: 0.1 10*3/uL (ref 0.0–0.1)
Basophils Relative: 1 %
Eosinophils Absolute: 1.8 10*3/uL — ABNORMAL HIGH (ref 0.0–0.5)
Eosinophils Relative: 11 %
HCT: 29.1 % — ABNORMAL LOW (ref 39.0–52.0)
Hemoglobin: 9.1 g/dL — ABNORMAL LOW (ref 13.0–17.0)
Immature Granulocytes: 0 %
Lymphocytes Relative: 27 %
Lymphs Abs: 4.6 10*3/uL — ABNORMAL HIGH (ref 0.7–4.0)
MCH: 33 pg (ref 26.0–34.0)
MCHC: 31.3 g/dL (ref 30.0–36.0)
MCV: 105.4 fL — ABNORMAL HIGH (ref 80.0–100.0)
Monocytes Absolute: 1.4 10*3/uL — ABNORMAL HIGH (ref 0.1–1.0)
Monocytes Relative: 8 %
Neutro Abs: 9.2 10*3/uL — ABNORMAL HIGH (ref 1.7–7.7)
Neutrophils Relative %: 53 %
Platelets: 228 10*3/uL (ref 150–400)
RBC: 2.76 MIL/uL — ABNORMAL LOW (ref 4.22–5.81)
RDW: 14.4 % (ref 11.5–15.5)
WBC: 17.2 10*3/uL — ABNORMAL HIGH (ref 4.0–10.5)
nRBC: 0 % (ref 0.0–0.2)

## 2021-12-17 LAB — RAD ONC ARIA SESSION SUMMARY
Course Elapsed Days: 2
Plan Fractions Treated to Date: 2
Plan Prescribed Dose Per Fraction: 2 Gy
Plan Total Fractions Prescribed: 30
Plan Total Prescribed Dose: 60 Gy
Reference Point Dosage Given to Date: 4 Gy
Reference Point Session Dosage Given: 2 Gy
Session Number: 2

## 2021-12-17 LAB — GLUCOSE, CAPILLARY
Glucose-Capillary: 162 mg/dL — ABNORMAL HIGH (ref 70–99)
Glucose-Capillary: 166 mg/dL — ABNORMAL HIGH (ref 70–99)
Glucose-Capillary: 65 mg/dL — ABNORMAL LOW (ref 70–99)
Glucose-Capillary: 92 mg/dL (ref 70–99)
Glucose-Capillary: 122 mg/dL — ABNORMAL HIGH (ref 70–99)

## 2021-12-17 LAB — RENAL FUNCTION PANEL
Albumin: 2.8 g/dL — ABNORMAL LOW (ref 3.5–5.0)
Anion gap: 10 (ref 5–15)
BUN: 69 mg/dL — ABNORMAL HIGH (ref 8–23)
CO2: 26 mmol/L (ref 22–32)
Calcium: 9.1 mg/dL (ref 8.9–10.3)
Chloride: 108 mmol/L (ref 98–111)
Creatinine, Ser: 2.75 mg/dL — ABNORMAL HIGH (ref 0.61–1.24)
GFR, Estimated: 25 mL/min — ABNORMAL LOW (ref >60)
Glucose, Bld: 104 mg/dL — ABNORMAL HIGH (ref 70–99)
Phosphorus: 3.1 mg/dL (ref 2.5–4.6)
Potassium: 3.4 mmol/L — ABNORMAL LOW (ref 3.5–5.1)
Sodium: 144 mmol/L (ref 135–145)

## 2021-12-17 LAB — MAGNESIUM: Magnesium: 1.9 mg/dL (ref 1.7–2.4)

## 2021-12-17 MED ORDER — FREE WATER
200.0000 mL | Freq: Every day | Status: DC
  Administered 2021-12-17 – 2021-12-21 (×23): 200 mL

## 2021-12-17 MED ORDER — METRONIDAZOLE 500 MG/100ML IV SOLN
500.0000 mg | Freq: Two times a day (BID) | INTRAVENOUS | Status: DC
  Administered 2021-12-17 – 2021-12-21 (×9): 500 mg via INTRAVENOUS
  Filled 2021-12-17 (×9): qty 100

## 2021-12-17 NOTE — Progress Notes (Signed)
PT Cancellation Note  Patient Details Name: John A Plasse Sr. MRN: [id number] DOB: 11/16/1955   Cancelled Treatment:    Reason Eval/Treat Not Completed: PT screened, no needs identified, will sign off (Patient mobilizing independently in room, up to shower without assist needed per NT. No skilled PT needs, will sign off at this time.)   Gwynneth Albright PT, [place] Office [phone] Pager [phone]  12/17/21 10:33 AM

## 2021-12-17 NOTE — Consult Note (Signed)
WOC Nurse Consult Note: Patient receiving care in [place] [facility]. Reason for Consult: "Patient has 2 areas that need wet to dry orders and 1 healing surgical scar that needs some kind of ointment per patient" The patient is currently in his radiation treatment session per his primary RN, [place].  This is the information I obtained from [place] during a phone conversation with her. There are dressings on the trach area and left jaw area. She did not look under them. She did see the left axillary area and she describes a healed surgical scar.  Neither the patient nor his daughter can tell them what type of ointment should be placed over the scar.  The best thing to do for this is to reach out to the LTC facility and ask what they have been using.  Monitor the wound area(s) for worsening of condition such as: Signs/symptoms of infection,  Increase in size,  Development of or worsening of odor, Development of pain, or increased pain at the affected locations.  Notify the medical team if any of these develop.  Thank you for the consult.  Discussed plan of care with the bedside nurse.  [place] nurse will not follow at this time.  Please re-consult the [place] team if needed.  Val Riles, RN, MSN, CWOCN, CNS-BC, pager [phone]

## 2021-12-17 NOTE — TOC Initial Note (Signed)
Transition of Care (TOC) - Initial/Assessment Note    Patient Details  Name: John A Cheema Sr. MRN: [id number] Date of Birth: 02/11/1956  Transition of Care [facility]) CM/SW Contact:    Dreyden Rohrman, Marjie Skiff, RN Phone Number: 12/17/2021, 2:36 PM  Clinical Narrative:                 Pt is active with [facility]. He was at [facility] prior to admission due to medical needs, not for therapy. Per Lexine Baton at [facility] pt can come back at [facility]. TOC will continue to follow.      Activities of Daily Living Home Assistive Devices/Equipment: None ADL Screening (condition at time of admission) Patient's cognitive ability adequate to safely complete daily activities?: Yes Is the patient deaf or have difficulty hearing?: No Does the patient have difficulty seeing, even when wearing glasses/contacts?: No Does the patient have difficulty concentrating, remembering, or making decisions?: No Patient able to express need for assistance with ADLs?: Yes Does the patient have difficulty dressing or bathing?: No Independently performs ADLs?: Yes (appropriate for developmental age) Does the patient have difficulty walking or climbing stairs?: No Weakness of Legs: None Weakness of Arms/Hands: None         Admission diagnosis:  Bacteremia [R78.81] Leukocytosis [D72.829] Oral cancer ([place]) [C06.9] Leukocytosis, unspecified type [D72.829] Patient Active Problem List   Diagnosis Date Noted   Malnutrition of moderate degree 12/17/2021   Oropharyngeal dysphagia 12/16/2021   Anemia of chronic disease 12/16/2021   Eosinophilia 12/16/2021   Bandemia 12/16/2021   Essential hypertension 12/15/2021   Dyslipidemia 12/15/2021   GERD without esophagitis 12/15/2021   Fatigue 12/08/2021   Cancer of mandible ([place]) 11/30/2021   PAD (peripheral artery disease) ([place]) 08/17/2020   Acute renal failure superimposed on stage 3 chronic kidney disease ([place]) 02/07/2018   Hypertension 02/07/2018   Stroke ([place])  11/15/2016   History of stroke    Hepatitis C virus infection without hepatic coma    Brainstem infarct, acute (HCC)    Hypokalemia    Leukocytosis    Acute blood loss anemia    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 ([place])    Cerebellar infarction ([place]) 11/04/2016   Hyperlipidemia due to type 2 diabetes mellitus ([place])    Gait disturbance, post-stroke    Chronic kidney disease, stage IV (severe) ([place]) 11/02/2016   Normocytic anemia 11/02/2016   Ataxia 11/02/2016   Ischemic stroke ([place]) 11/02/2016   Puncture wound of right foot 11/02/2016   Hypertensive heart and chronic kidney disease with heart failure and stage 1 through stage 4 chronic kidney disease, or chronic kidney disease ([place]) 11/02/2016   Chronic left-sided low back pain 11/02/2016   Demand ischemia ([place])    Hypertensive urgency 03/29/2016   Abnormal electrocardiogram 05/24/2011   Chest pain with moderate risk for cardiac etiology 05/24/2011   Tobacco abuse 05/24/2011   DM (diabetes mellitus), type 2 with renal complications ([place]) 36/64/4034   PCP:  No primary care provider on file. Pharmacy:   [id number] [place]Phone: [phone]: [phone] [place] [address]Phone: [phone]: [phone] [id number] [place] [address]: [phone]: [phone]  Social Determinants of Health (SDOH) Interventions    Readmission Risk Interventions    12/17/2021    2:36 PM  Readmission Risk Prevention Plan  Transportation Screening Complete  Medication Review ([place]) Complete  PCP or Specialist appointment within 3-5 days of discharge Complete  HRI  or Home Care Consult Complete  SW Recovery Care/Counseling Consult Complete  [place] Complete

## 2021-12-17 NOTE — Progress Notes (Signed)
Subjective: Feeling good.  Objective: Vital signs in last 24 hours: Temp:  [97.7 F (36.5 C)-97.9 F (36.6 C)] 97.7 F (36.5 C) (07/07 0531) Pulse Rate:  [69-83] 83 (07/07 0531) Resp:  [20] 20 (07/07 0531) BP: (144-175)/(71-85) 144/84 (07/07 0531) SpO2:  [98 %-100 %] 98 % (07/07 0531) Wt Readings from Last 1 Encounters:  12/15/21 73 kg    Intake/Output from previous day: 07/06 0701 - 07/07 0700 In: 2206.3 [I.V.:1706.8; IV Piggyback:99.6] Out: 0  Intake/Output this shift: No intake/output data recorded.  General appearance: alert, cooperative, and no distress Throat: oral flap healthy-appearing, less purulent fluid expressible from granulated area to left of flap  Recent Labs    12/16/21 0548 12/17/21 0546  WBC 22.1* 17.2*  HGB 8.9* 9.1*  HCT 28.3* 29.1*  PLT 227 228    Recent Labs    12/16/21 0548 12/17/21 0547  NA 144 144  K 3.1* 3.4*  CL 106 108  CO2 31 26  GLUCOSE 138* 104*  BUN 80* 69*  CREATININE 2.91* 2.75*  CALCIUM 9.3 9.1    Medications: I have reviewed the patient's current medications.  Assessment/Plan: Oral cancer s/p free flap reconstruction with associated infection  Dr. Hendricks Limes aware of situation.  Continue broad spectrum antibiotics waiting for culture result.  Amount of expressible pus has decreased as has the WBC.   LOS: 2 days   Melida Quitter 12/17/2021, 7:40 PM

## 2021-12-17 NOTE — Progress Notes (Signed)
Initial Nutrition Assessment  DOCUMENTATION CODES:   Non-severe (moderate) malnutrition in context of chronic illness  INTERVENTION:  - continue 1 carton (237 ml) Nepro x5/day and 45 ml Prosource TF (or equivalent) BID.  - will order 200 ml free water x6/day.   NUTRITION DIAGNOSIS:   Moderate Malnutrition related to chronic illness, cancer and cancer related treatments as evidenced by moderate fat depletion, moderate muscle depletion, severe muscle depletion.  GOAL:   Patient will meet greater than or equal to 90% of their needs  MONITOR:   TF tolerance, Labs, Weight trends, Skin  REASON FOR ASSESSMENT:   Consult Enteral/tube feeding initiation and management  ASSESSMENT:   66 y.o. 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 He was discharged from the hospital on 6/29 after admission for PNA. He was admitted due to leukocytosis.  Patient known to this RD from assessment on 12/08/21.  Patient sitting up in bed with RN present at bedside. No visitors present during RD visit this AM.   Patient has PEG with Mickey connection which became clogged yesterday. RN shares that it has been doing ok for most of the morning and that adjustment may need to be made in where tube is clamped as clamp is causing pinching off of the tube; RD able to visualize this as RN was administering meds via tube during RD visit. Discussed options for tubing replacement if needed. Patient shares that this type of tubing was recently added and is very new to him.   Order in place for 1 carton (237 ml) Nepro x5/day and 45 ml Prosource TF BID. This regimen provides 2180 kcal, 117 kcal, and 860 ml free water.   No  free water flush ordered yet. Discussed with patient and RN.   Patient shared that he has been feeling "less fragile" than he did last week. He attributes this to working with PT/OT and shares he has been working with SLP at the [facility]  Weight on 7/5 was 161 lb and weight stable from 6/29. Weight on 6/20 was 153 lb and prior to that the most recently documented weight was 145 lb on 09/14/21.   Labs reviewed; CBG: 162 mg/dl, K: 3.4 mmol/l, BUN: 69 mg/dl, creatinine: 2.75 mg/dl, GFR: 25 ml/min.   Medications reviewed; 300 mg ferrous sulfate/day, sliding scale novolog, 8 units semglee/day, 40 mg protonix per PEG/day.  IVF; NS-20 mEq IV KCl @ 100 ml/hr.    NUTRITION - FOCUSED PHYSICAL EXAM:  Flowsheet Row Most Recent Value  Orbital Region Moderate depletion  Upper Arm Region Moderate depletion  Thoracic and Lumbar Region Unable to assess  Buccal Region Moderate depletion  Temple Region Mild depletion  Clavicle Bone Region Severe depletion  Clavicle and Acromion Bone Region Severe depletion  Scapular Bone Region Moderate depletion  Dorsal Hand Mild depletion  Patellar Region Moderate depletion  Anterior Thigh Region Moderate depletion  Posterior Calf Region No depletion  [likely masked by edema]  Edema (RD Assessment) Mild  [mild to moderate to BLE]  Hair Reviewed  Eyes Reviewed  Mouth Reviewed  Skin Reviewed  Nails Reviewed       Diet Order:   Diet Order  Diet NPO time specified  Diet effective now                   EDUCATION NEEDS:   No education needs have been identified at this time  Skin:  Skin Assessment: Skin Integrity Issues: Skin Integrity Issues:: Incisions Incisions: L jaw on 6/28  Last BM:  PTA/unknown  Height:   Ht Readings from Last 1 Encounters:  12/15/21 '5\' 7"'$  (1.702 m)    Weight:   Wt Readings from Last 1 Encounters:  12/15/21 73 kg    BMI:  Body mass index is 25.21 kg/m.  Estimated Nutritional Needs:  Kcal:   2100-2300 kcal Protein:  105-120 grams Fluid:  >/= 2.3 L/day     Jarome Matin, MS, RD, LDN, CNSC Registered Dietitian II Inpatient Clinical Nutrition RD pager # and on-call/weekend pager # available in [facility]

## 2021-12-17 NOTE — Progress Notes (Signed)
Pharmacy Antibiotic Note  John A Kamari Buch. is a 66 y.o. male admitted on 12/15/2021 with  Mandible infection .  Pharmacy has been consulted for Vanco, Cefepime dosing.  ID: Sepsis- jaw infection s/p mandible resection in 09/2021. Now with purulent drainage - Recently treated for PNA for Unasyn/Augmentin. - Rising WBC despite treatment for PNA at SNF w/ CTX  - maxillofacial CT without clear signs of abscess or infection - Afebrile. WBC 17.2 down, Scr 2.75  Antimicrobials this admission: 7/5 Zosyn x 1 7/5 vancomycin >>  7/5 cefepime >>  7/5 flagyl >>   Dose adjustments this admission:  - 7/5: Vanc 1250 q48 (AUC 541 w/ SCr 3.16; Vd 0.72) - 7/7: Con't same Vanco '1250mg'$ /48h (AUC 515, Scr 2.75 down)  Microbiology results: 7/5 BCx: NGTD 7/5 UCx: neg 7/6: Wound: GPC chains, pairs  Plan: Con't Vancomycin 1250 mg IV Q 48 hrs. Goal AUC 400-550. Expected AUC: 515. SCr used: 2.75 - Cefepime 2q24 - Flagyl '500mg'$  IV q12h    Height: '5\' 7"'$  (170.2 cm) Weight: 73 kg (160 lb 15 oz) IBW/kg (Calculated) : 66.1  Temp (24hrs), Avg:97.9 F (36.6 C), Min:97.7 F (36.5 C), Max:98.5 F (36.9 C)  Recent Labs  Lab 12/15/21 1826 12/15/21 2010 12/16/21 0548 12/17/21 0546 12/17/21 0547  WBC 24.3*  --  22.1* 17.2*  --   CREATININE 3.16*  --  2.91*  --  2.75*  LATICACIDVEN 0.7 0.7  --   --   --     Estimated Creatinine Clearance: 24.4 mL/min (A) (by C-G formula based on SCr of 2.75 mg/dL (H)).    Allergies  Allergen Reactions   Amlodipine Besy-Benazepril Hcl Anaphylaxis, Shortness Of Breath and Swelling    Mouth and tongue swelling   Shellfish Allergy Anaphylaxis, Shortness Of Breath and Swelling    Arelis Neumeier S. Alford Highland, PharmD, BCPS Clinical Staff Pharmacist [email or web address]  John Jacobson 12/17/2021 10:37 AM

## 2021-12-17 NOTE — Progress Notes (Signed)
PROGRESS NOTE  John Jacobson FKC:[id number] DOB: Feb 16, 1956   PCP: No primary care provider on file.  Patient is from: SNF  DOA: 12/15/2021 LOS: 2  Chief complaints Chief Complaint  Patient presents with   Abnormal Labs     Brief Narrative / Interim history: 66 year old M with PMH of stage IVA mandibular SCC, DM-2, CKD-4, CVA, HTN, dysphagia with tube feed dependent and recent hospitalization from 6/27-6/29 for bilateral aspiration pneumonia and AKI when he left AMA to go to his outpatient appointments returning from SNF for leukocytosis and left oral swelling.  Patient was on IV ceftriaxone via PICC line.   In ED, slightly hypertensive.  K3.1. Cr 3.16 (4.0 on 6/29).  BUN 87.  WBC 24.3 with eosinophilia and some left shift.  CXR with improved lung aeration, stable cardiomegaly and PICC line projecting over the right axilla.  Cultures obtained.  Started on IV vancomycin and Zosyn and admitted for leukocytosis.  ENT consulted.  Blood cultures NGTD.  Patient was evaluated by ENT.  Superficial wound culture sent and gram stain from his wound with GPC in chains and pairs but cultures negative.  Per radiation oncology, patient's oral surgeons at [facility] recommended IV Unasyn and vancomycin until cultures are finalized. Patient remains on IV cefepime, Flagyl and vancomycin.    Subjective: Seen and examined earlier this morning.  No major events overnight of this morning.  No complaints.  He denies pain, shortness of breath, nausea or vomiting.  Objective: Vitals:   12/16/21 1653 12/16/21 2109 12/17/21 0132 12/17/21 0531  BP: (!) 194/104 (!) 175/85 (!) 144/71 (!) 144/84  Pulse: 78 69 78 83  Resp: '18 20 20 20  '$ Temp: 97.7 F (36.5 C) 97.7 F (36.5 C) 97.9 F (36.6 C) 97.7 F (36.5 C)  TempSrc: Oral Oral Oral Oral  SpO2: 97% 100% 100% 98%  Weight:      Height:        Examination:  GENERAL: No apparent distress.  Nontoxic. HEENT: MMM.  Bulky soft tissue flap over mandible  centrally.  Small purulent drainage from left neck incision site behind the mandible. NECK: Supple.  No apparent JVD.  RESP:  No IWOB.  Fair aeration bilaterally. CVS:  RRR. Heart sounds normal.  ABD/GI/GU: BS+. Abd soft, NTND.  MSK/EXT:  Moves extremities. No apparent deformity. No edema.  SKIN: no apparent skin lesion or wound NEURO: Awake and alert. Oriented appropriately.  No apparent focal neuro deficit. PSYCH: Calm. Normal affect.   Procedures:  None  Microbiology summarized: Urine culture NGTD Blood culture NGTD Superficial culture NGTD.  Gram stain with rare GPC's in pairs and chains.  Assessment and plan: Principal Problem:   Leukocytosis Active Problems:   Hypokalemia   Chronic kidney disease, stage IV (severe) (HCC)   DM (diabetes mellitus), type 2 with renal complications (HCC)   History of stroke   PAD (peripheral artery disease) (HCC)   Cancer of mandible (HCC)   Essential hypertension   Dyslipidemia   GERD without esophagitis   Oropharyngeal dysphagia   Anemia of chronic disease   Eosinophilia   Bandemia   Malnutrition of moderate degree  Leukocytosis with eosinophilia and some degree of bandemia: Unclear source but it is not all acute.  He had WBC of 18.3 when he left AGAINST MEDICAL ADVICE on 6/29.  Differential shows significant eosinophilia in addition to bandemia.  He has no respiratory symptoms.  Improved aeration on chest x-ray.  He was on IV ceftriaxone via PICC line at  SNF.  He has significant left mandibular swelling.  ENT expressed pus with gentle massage.  Pro-Cal and lactic acid negative.  Culture data as above. -Continue cefepime and vancomycin. -Added Flagyl for anaerobic coverage. -Appreciate input by ENT -Per radiation oncology, patient's oral surgeon at [facility] recommended IV vancomycin and IV Unasyn pending final culture result  CKD-4/azotemia: Improving. Recent Labs    08/03/21 1019 08/06/21 1017 09/14/21 0850 12/07/21 2241 12/08/21 0351  12/08/21 1416 12/09/21 0507 12/15/21 1826 12/16/21 0548 12/17/21 0547  BUN 83* 111* 35* 135* 122* 112* 113* 87* 80* 69*  CREATININE 5.40* 5.50* 2.64* 4.11* 4.08* 4.28* 4.00* 3.16* 2.91* 2.75*  -Continue IV fluid -Avoid nephrotoxic meds  Stage IV left mandibular cancer: Had reconstruction surgery.  Followed by oncology and radiation oncology. -Radiation oncology on board.  IDDM-2 with CKD-4, PAD and other complications Recent Labs  Lab 12/16/21 1145 12/16/21 1659 12/16/21 2223 12/17/21 0819 12/17/21 1218  GLUCAP 136* 121* 71 162* 166*  -Continue sliding scale insulin   Anemia of chronic disease: Relatively stable. Recent Labs    08/03/21 1019 08/06/21 1017 09/14/21 0850 12/07/21 2241 12/09/21 0507 12/15/21 1826 12/16/21 0548 12/17/21 0546  HGB 9.9* 10.2* 9.3* 9.8* 8.9* 9.6* 8.9* 9.1*  -Continue monitoring  Hypokalemia: K 3.4. -K-Dur 40x2 per tube   GERD without esophagitis -Continue PPI therapy.  Dyslipidemia -Continue statin   Essential hypertension -Continue home meds  History of CVA Physical deconditioning -PT/OT eval  Oropharyngeal dysphagia Moderate malnutrition Body mass index is 25.21 kg/m. Nutrition Problem: Moderate Malnutrition Etiology: chronic illness, cancer and cancer related treatments Signs/Symptoms: moderate fat depletion, moderate muscle depletion, severe muscle depletion Interventions: Tube feeding, Prostat   DVT prophylaxis:  enoxaparin (LOVENOX) injection 30 mg Start: 12/15/21 2200  Code Status: Full code Family Communication: None at bedside Level of care: Med-Surg Status is: Inpatient Remains inpatient appropriate because: Due to leukocytosis   Final disposition: Likely back to SNF once medically cleared Consultants:  Oncology Radiation oncology ENT  Sch Meds:  Scheduled Meds:  atorvastatin  40 mg Per Tube Daily   carvedilol  25 mg Per Tube Q12H   chlorhexidine  15 mL Mouth/Throat TID   enoxaparin (LOVENOX)  injection  30 mg Subcutaneous Q24H   feeding supplement (NEPRO CARB STEADY)  237 mL Per Tube 5 X Daily   feeding supplement (PROSource TF)  45 mL Per Tube BID   ferrous sulfate  300 mg Per Tube Daily   free water  200 mL Per Tube 6 X Daily   gabapentin  300 mg Per Tube QHS   hydrALAZINE  100 mg Per Tube TID   insulin aspart  0-15 Units Subcutaneous TID WC   insulin aspart  0-5 Units Subcutaneous QHS   insulin glargine-yfgn  8 Units Subcutaneous Daily   isosorbide dinitrate  20 mg Per Tube TID   lactobacillus  1 g Per Tube Daily   pantoprazole sodium  40 mg Per Tube Daily   terazosin  2 mg Per Tube Daily   Continuous Infusions:  0.9 % NaCl with KCl 20 mEq / L 100 mL/hr at 12/17/21 1014   ceFEPime (MAXIPIME) IV 2 g (12/17/21 0272)   metronidazole 500 mg (12/17/21 1118)   vancomycin 1,250 mg (12/16/21 2202)   PRN Meds:.acetaminophen (TYLENOL) oral liquid 160 mg/5 mL **OR** acetaminophen, labetalol, melatonin, ondansetron **OR** ondansetron (ZOFRAN) IV, oxyCODONE, polyethylene glycol, senna-docusate, traZODone  Antimicrobials: Anti-infectives (From admission, onward)    Start     Dose/Rate Route Frequency Ordered Stop  12/17/21 1000  metroNIDAZOLE (FLAGYL) IVPB 500 mg        500 mg 100 mL/hr over 60 Minutes Intravenous Every 12 hours 12/17/21 0814 12/24/21 0959   12/16/21 2200  vancomycin (VANCOREADY) IVPB 1250 mg/250 mL        1,250 mg 166.7 mL/hr over 90 Minutes Intravenous Every 48 hours 12/15/21 2041     12/16/21 0600  ceFEPIme (MAXIPIME) 2 g in sodium chloride 0.9 % 100 mL IVPB        2 g 200 mL/hr over 30 Minutes Intravenous Every 24 hours 12/15/21 2041     12/15/21 2045  piperacillin-tazobactam (ZOSYN) IVPB 3.375 g  Status:  Discontinued        3.375 g 100 mL/hr over 30 Minutes Intravenous Every 6 hours 12/15/21 2041 12/15/21 2042   12/15/21 2030  ceFEPIme (MAXIPIME) 2 g in sodium chloride 0.9 % 100 mL IVPB  Status:  Discontinued        2 g 200 mL/hr over 30 Minutes  Intravenous  Once 12/15/21 2020 12/15/21 2041   12/15/21 2030  metroNIDAZOLE (FLAGYL) IVPB 500 mg  Status:  Discontinued        500 mg 100 mL/hr over 60 Minutes Intravenous Every 12 hours 12/15/21 2020 12/15/21 2041   12/15/21 2030  vancomycin (VANCOCIN) IVPB 1000 mg/200 mL premix  Status:  Discontinued        1,000 mg 200 mL/hr over 60 Minutes Intravenous  Once 12/15/21 2020 12/15/21 2041   12/15/21 1945  vancomycin (VANCOCIN) IVPB 1000 mg/200 mL premix        1,000 mg 200 mL/hr over 60 Minutes Intravenous  Once 12/15/21 1944 12/15/21 2254   12/15/21 1945  piperacillin-tazobactam (ZOSYN) IVPB 3.375 g        3.375 g 100 mL/hr over 30 Minutes Intravenous  Once 12/15/21 1944 12/15/21 2254        I have personally reviewed the following labs and images: CBC: Recent Labs  Lab 12/15/21 1826 12/16/21 0548 12/17/21 0546  WBC 24.3* 22.1* 17.2*  NEUTROABS 10.5*  --  9.2*  HGB 9.6* 8.9* 9.1*  HCT 30.5* 28.3* 29.1*  MCV 103.0* 103.3* 105.4*  PLT 247 227 228   BMP &GFR Recent Labs  Lab 12/15/21 1826 12/16/21 0548 12/17/21 0546 12/17/21 0547  NA 143 144  --  144  K 3.1* 3.1*  --  3.4*  CL 106 106  --  108  CO2 30 31  --  26  GLUCOSE 114* 138*  --  104*  BUN 87* 80*  --  69*  CREATININE 3.16* 2.91*  --  2.75*  CALCIUM 9.8 9.3  --  9.1  MG  --  2.1 1.9  --   PHOS  --   --   --  3.1   Estimated Creatinine Clearance: 24.4 mL/min (A) (by C-G formula based on SCr of 2.75 mg/dL (H)). Liver & Pancreas: Recent Labs  Lab 12/15/21 1826 12/17/21 0547  AST 25  --   ALT 27  --   ALKPHOS 88  --   BILITOT 0.4  --   PROT 7.6  --   ALBUMIN 3.2* 2.8*   No results for input(s): "LIPASE", "AMYLASE" in the last 168 hours. No results for input(s): "AMMONIA" in the last 168 hours. Diabetic: No results for input(s): "HGBA1C" in the last 72 hours. Recent Labs  Lab 12/16/21 1145 12/16/21 1659 12/16/21 2223 12/17/21 0819 12/17/21 1218  GLUCAP 136* 121* 71 162* 166*   Cardiac  Enzymes: No results for input(s): "CKTOTAL", "CKMB", "CKMBINDEX", "TROPONINI" in the last 168 hours. No results for input(s): "PROBNP" in the last 8760 hours. Coagulation Profile: Recent Labs  Lab 12/16/21 0548  INR 1.2   Thyroid Function Tests: No results for input(s): "TSH", "T4TOTAL", "FREET4", "T3FREE", "THYROIDAB" in the last 72 hours. Lipid Profile: No results for input(s): "CHOL", "HDL", "LDLCALC", "TRIG", "CHOLHDL", "LDLDIRECT" in the last 72 hours. Anemia Panel: No results for input(s): "VITAMINB12", "FOLATE", "FERRITIN", "TIBC", "IRON", "RETICCTPCT" in the last 72 hours. Urine analysis:    Component Value Date/Time   COLORURINE YELLOW 12/15/2021 2051   APPEARANCEUR CLEAR 12/15/2021 2051   LABSPEC 1.012 12/15/2021 2051   PHURINE 6.0 12/15/2021 2051   GLUCOSEU NEGATIVE 12/15/2021 2051   HGBUR NEGATIVE 12/15/2021 2051   [place] NEGATIVE 12/15/2021 2051   [place] NEGATIVE 12/15/2021 2051   PROTEINUR 30 (A) 12/15/2021 2051   NITRITE NEGATIVE 12/15/2021 2051   LEUKOCYTESUR LARGE (A) 12/15/2021 2051   Sepsis Labs: Invalid input(s): "PROCALCITONIN", "LACTICIDVEN"  Microbiology: Recent Results (from the past 240 hour(s))  Blood culture (routine x 2)     Status: None (Preliminary result)   Collection Time: 12/15/21  6:25 PM   Specimen: BLOOD  Result Value Ref Range Status   Specimen Description   Final    BLOOD BLOOD LEFT WRIST Performed at [facility] [address] [place], [place] [address]    Special Requests   Final    BOTTLES DRAWN AEROBIC AND ANAEROBIC Blood Culture results may not be optimal due to an excessive volume of blood received in culture bottles Performed at [facility] [address], [place], [place] [address]    Culture   Final    NO GROWTH 2 DAYS Performed at [facility] [address], [place], [place] [address]    Report Status PENDING  Incomplete  Blood culture (routine x 2)      Status: None (Preliminary result)   Collection Time: 12/15/21  6:26 PM   Specimen: BLOOD  Result Value Ref Range Status   Specimen Description   Final    BLOOD BLOOD RIGHT WRIST Performed at [facility] [address] [place], [place] [address]    Special Requests   Final    BOTTLES DRAWN AEROBIC AND ANAEROBIC Blood Culture adequate volume Performed at [facility] [address], [place], [place] [address]    Culture   Final    NO GROWTH 2 DAYS Performed at [facility] [address] [place], [place] [address]    Report Status PENDING  Incomplete  Urine Culture     Status: None   Collection Time: 12/15/21  8:52 PM   Specimen: Urine, Clean Catch  Result Value Ref Range Status   Specimen Description   Final    URINE, CLEAN CATCH Performed at [facility], [place] [address] [place], [place] [address]    Special Requests   Final    NONE Performed at [facility], [place] [address] [place],  [address]    Culture   Final    NO GROWTH Performed at [facility], [place] [address], [place],  [address]    Report Status 12/16/2021 FINAL  Final  Aerobic Culture w Gram Stain (superficial specimen)     Status: None (Preliminary result)   Collection Time: 12/16/21  6:47 PM   Specimen: Wound  Result Value Ref Range Status  Specimen Description   Final    WOUND Performed at [facility] [address] [place], [place] [address]    Special Requests   Final    NONE MOUTH Performed at [facility] [address] [place], [place] [address]    Gram Stain   Final    NO WBC SEEN RARE GRAM POSITIVE COCCI IN CHAINS RARE GRAM POSITIVE COCCI IN PAIRS    Culture   Final    NO GROWTH < 12 HOURS Performed at [facility], [address], [place], [place] [address]    Report Status PENDING  Incomplete    Radiology Studies: No results  found.    Kariann Wecker T. [place]  If 7PM-7AM, please contact night-coverage [email or web address] 12/17/2021, 2:25 PM

## 2021-12-17 NOTE — Progress Notes (Signed)
OT Cancellation Note  Patient Details Name: John A Mikulski Sr. MRN: [id number] DOB: 20-Nov-1955   Cancelled Treatment:    Reason Eval/Treat Not Completed: OT screened, no needs identified, will sign off. Patient ambulating independently in room and tech reports patient showered himself. OT evaluation not indicated as he is independent with ADLs.  Shivaan Tierno L Corretta Munce 12/17/2021, 11:17 AM

## 2021-12-18 DIAGNOSIS — D638 Anemia in other chronic diseases classified elsewhere: Secondary | ICD-10-CM | POA: Diagnosis not present

## 2021-12-18 DIAGNOSIS — C411 Malignant neoplasm of mandible: Secondary | ICD-10-CM | POA: Diagnosis not present

## 2021-12-18 DIAGNOSIS — D72825 Bandemia: Secondary | ICD-10-CM | POA: Diagnosis not present

## 2021-12-18 DIAGNOSIS — D72829 Elevated white blood cell count, unspecified: Secondary | ICD-10-CM | POA: Diagnosis not present

## 2021-12-18 LAB — CBC WITH DIFFERENTIAL/PLATELET
Abs Immature Granulocytes: 0.07 10*3/uL (ref 0.00–0.07)
Basophils Absolute: 0.1 10*3/uL (ref 0.0–0.1)
Basophils Relative: 1 %
Eosinophils Absolute: 1.4 10*3/uL — ABNORMAL HIGH (ref 0.0–0.5)
Eosinophils Relative: 9 %
HCT: 29.8 % — ABNORMAL LOW (ref 39.0–52.0)
Hemoglobin: 9.3 g/dL — ABNORMAL LOW (ref 13.0–17.0)
Immature Granulocytes: 1 %
Lymphocytes Relative: 24 %
Lymphs Abs: 3.6 10*3/uL (ref 0.7–4.0)
MCH: 32.9 pg (ref 26.0–34.0)
MCHC: 31.2 g/dL (ref 30.0–36.0)
MCV: 105.3 fL — ABNORMAL HIGH (ref 80.0–100.0)
Monocytes Absolute: 1.2 10*3/uL — ABNORMAL HIGH (ref 0.1–1.0)
Monocytes Relative: 8 %
Neutro Abs: 9.2 10*3/uL — ABNORMAL HIGH (ref 1.7–7.7)
Neutrophils Relative %: 57 %
Platelets: 216 10*3/uL (ref 150–400)
RBC: 2.83 MIL/uL — ABNORMAL LOW (ref 4.22–5.81)
RDW: 14.4 % (ref 11.5–15.5)
WBC: 15.5 10*3/uL — ABNORMAL HIGH (ref 4.0–10.5)
nRBC: 0 % (ref 0.0–0.2)

## 2021-12-18 LAB — GLUCOSE, CAPILLARY
Glucose-Capillary: 169 mg/dL — ABNORMAL HIGH (ref 70–99)
Glucose-Capillary: 149 mg/dL — ABNORMAL HIGH (ref 70–99)
Glucose-Capillary: 134 mg/dL — ABNORMAL HIGH (ref 70–99)
Glucose-Capillary: 206 mg/dL — ABNORMAL HIGH (ref 70–99)

## 2021-12-18 LAB — RENAL FUNCTION PANEL
Albumin: 3.1 g/dL — ABNORMAL LOW (ref 3.5–5.0)
Anion gap: 9 (ref 5–15)
BUN: 68 mg/dL — ABNORMAL HIGH (ref 8–23)
CO2: 24 mmol/L (ref 22–32)
Calcium: 9.4 mg/dL (ref 8.9–10.3)
Chloride: 112 mmol/L — ABNORMAL HIGH (ref 98–111)
Creatinine, Ser: 2.76 mg/dL — ABNORMAL HIGH (ref 0.61–1.24)
GFR, Estimated: 24 mL/min — ABNORMAL LOW (ref >60)
Glucose, Bld: 169 mg/dL — ABNORMAL HIGH (ref 70–99)
Phosphorus: 2.8 mg/dL (ref 2.5–4.6)
Potassium: 4.1 mmol/L (ref 3.5–5.1)
Sodium: 145 mmol/L (ref 135–145)

## 2021-12-18 LAB — CREATININE, SERUM
Creatinine, Ser: 2.8 mg/dL — ABNORMAL HIGH (ref 0.61–1.24)
GFR, Estimated: 24 mL/min — ABNORMAL LOW (ref >60)

## 2021-12-18 LAB — MAGNESIUM: Magnesium: 2.1 mg/dL (ref 1.7–2.4)

## 2021-12-18 NOTE — Progress Notes (Signed)
PROGRESS NOTE  Rocko Fesperman UUV:[id number] DOB: 12/09/1955   PCP: No primary care provider on file.  Patient is from: SNF  DOA: 12/15/2021 LOS: 3  Chief complaints Chief Complaint  Patient presents with   Abnormal Labs     Brief Narrative / Interim history: 66 year old M with PMH of stage IVA mandibular SCC, DM-2, CKD-4, CVA, HTN, dysphagia with tube feed dependent and recent hospitalization from 6/27-6/29 for bilateral aspiration pneumonia and AKI when he left AMA to go to his outpatient appointments returning from SNF for leukocytosis and left oral swelling.  Patient was on IV ceftriaxone via PICC line.   In ED, slightly hypertensive.  K3.1. Cr 3.16 (4.0 on 6/29).  BUN 87.  WBC 24.3 with eosinophilia and some left shift.  CXR with improved lung aeration, stable cardiomegaly and PICC line projecting over the right axilla.  Cultures obtained.  Started on IV vancomycin and Zosyn and admitted for leukocytosis.  ENT consulted.  Blood cultures NGTD.  Patient was evaluated by ENT.  Superficial wound culture sent and gram stain with GPC in chains and pairs.  Culture pending.  Per radiation oncology, patient's oral surgeons at [facility] recommended IV Unasyn and vancomycin until cultures are finalized. Patient remains on IV cefepime, Flagyl and vancomycin.    Subjective: Seen and examined earlier this morning.  No major events overnight of this morning.  No complaints.  He denies pain, shortness of breath, GI or UTI symptoms.  Objective: Vitals:   12/16/21 2109 12/17/21 0132 12/17/21 0531 12/18/21 0455  BP: (!) 175/85 (!) 144/71 (!) 144/84 (!) 141/75  Pulse: 69 78 83 68  Resp: '20 20 20 18  '$ Temp: 97.7 F (36.5 C) 97.9 F (36.6 C) 97.7 F (36.5 C) 97.6 F (36.4 C)  TempSrc: Oral Oral Oral Oral  SpO2: 100% 100% 98% 98%  Weight:      Height:        Examination:  GENERAL: No apparent distress.  Nontoxic. HEENT: MMM.  Bulky soft tissue flap in left oral floor.  Small purulent  drainage from left neck incision site behind the mandible. NECK: Supple.  No apparent JVD.  RESP:  No IWOB.  Fair aeration bilaterally. CVS:  RRR. Heart sounds normal.  ABD/GI/GU: BS+. Abd soft, NTND.  G-tube in place. MSK/EXT:  Moves extremities. No apparent deformity. No edema.  SKIN: no apparent skin lesion or wound NEURO: Awake and alert. Oriented appropriately.  No apparent focal neuro deficit. PSYCH: Calm. Normal affect.   Procedures:  None  Microbiology summarized: Urine culture NGTD Blood culture NGTD Superficial culture pending.  Gram stain with rare GPC's in pairs and chains.  Assessment and plan: Principal Problem:   Leukocytosis Active Problems:   Hypokalemia   Chronic kidney disease, stage IV (severe) (HCC)   DM (diabetes mellitus), type 2 with renal complications (HCC)   History of stroke   PAD (peripheral artery disease) (HCC)   Cancer of mandible (HCC)   Essential hypertension   Dyslipidemia   GERD without esophagitis   Oropharyngeal dysphagia   Anemia of chronic disease   Eosinophilia   Bandemia   Malnutrition of moderate degree   Stage IV left mandibular cancer s/p free flap reconstruction with associated infection/abscess  Leukocytosis with eosinophilia and some degree of bandemia likely due to the above -Per radiation oncology, patient's oral surgeon at [facility] recommended IV vancomycin and IV Unasyn pending final culture result but will continue cefepime, vancomycin and Flagyl for broader coverage now -Appreciate input by  ENT -Radiation oncology on board  CKD-4/azotemia: Improving. Recent Labs    08/06/21 1017 09/14/21 0850 12/07/21 2241 12/08/21 0351 12/08/21 1416 12/09/21 0507 12/15/21 1826 12/16/21 0548 12/17/21 0547 12/18/21 1039  BUN 111* 35* 135* 122* 112* 113* 87* 80* 69* 68*  CREATININE 5.50* 2.64* 4.11* 4.08* 4.28* 4.00* 3.16* 2.91* 2.75* 2.76*  2.80*  -Continue IV fluid -Avoid nephrotoxic meds  IDDM-2 with CKD-4, PAD and other  complications: F6O 1.3% on 08/03/2021 Recent Labs  Lab 12/17/21 1752 12/17/21 1842 12/17/21 2110 12/18/21 0710 12/18/21 1201  GLUCAP 65* 92 122* 169* 149*  -Continue sliding scale insulin -Check hemoglobin A1c  Anemia of chronic disease: Relatively stable. Recent Labs    08/03/21 1019 08/06/21 1017 09/14/21 0850 12/07/21 2241 12/09/21 0507 12/15/21 1826 12/16/21 0548 12/17/21 0546 12/18/21 1039  HGB 9.9* 10.2* 9.3* 9.8* 8.9* 9.6* 8.9* 9.1* 9.3*  -Continue monitoring  Hypokalemia: Resolved   GERD without esophagitis -Continue PPI therapy.  Dyslipidemia -Continue statin   Essential hypertension: BP within acceptable range -Continue home meds  History of CVA Physical deconditioning -PT/OT eval  Oropharyngeal dysphagia Moderate malnutrition Body mass index is 25.21 kg/m. Nutrition Problem: Moderate Malnutrition Etiology: chronic illness, cancer and cancer related treatments Signs/Symptoms: moderate fat depletion, moderate muscle depletion, severe muscle depletion Interventions: Tube feeding, Prostat   DVT prophylaxis:  enoxaparin (LOVENOX) injection 30 mg Start: 12/15/21 2200  Code Status: Full code Family Communication: None at bedside Level of care: Med-Surg Status is: Inpatient Remains inpatient appropriate because: Oral infection and leukocytosis   Final disposition: Likely back to SNF once medically cleared Consultants:  Radiation oncology ENT  Sch Meds:  Scheduled Meds:  atorvastatin  40 mg Per Tube Daily   carvedilol  25 mg Per Tube Q12H   chlorhexidine  15 mL Mouth/Throat TID   enoxaparin (LOVENOX) injection  30 mg Subcutaneous Q24H   feeding supplement (NEPRO CARB STEADY)  237 mL Per Tube 5 X Daily   feeding supplement (PROSource TF)  45 mL Per Tube BID   ferrous sulfate  300 mg Per Tube Daily   free water  200 mL Per Tube 6 X Daily   gabapentin  300 mg Per Tube QHS   hydrALAZINE  100 mg Per Tube TID   insulin aspart  0-15 Units  Subcutaneous TID WC   insulin aspart  0-5 Units Subcutaneous QHS   insulin glargine-yfgn  8 Units Subcutaneous Daily   isosorbide dinitrate  20 mg Per Tube TID   lactobacillus  1 g Per Tube Daily   pantoprazole sodium  40 mg Per Tube Daily   terazosin  2 mg Per Tube Daily   Continuous Infusions:  0.9 % NaCl with KCl 20 mEq / L 100 mL/hr at 12/18/21 0526   ceFEPime (MAXIPIME) IV 2 g (12/18/21 0527)   metronidazole 500 mg (12/18/21 1124)   vancomycin 1,250 mg (12/16/21 2202)   PRN Meds:.acetaminophen (TYLENOL) oral liquid 160 mg/5 mL **OR** acetaminophen, labetalol, melatonin, ondansetron **OR** ondansetron (ZOFRAN) IV, oxyCODONE, polyethylene glycol, senna-docusate, traZODone  Antimicrobials: Anti-infectives (From admission, onward)    Start     Dose/Rate Route Frequency Ordered Stop   12/17/21 1000  metroNIDAZOLE (FLAGYL) IVPB 500 mg        500 mg 100 mL/hr over 60 Minutes Intravenous Every 12 hours 12/17/21 0814 12/24/21 0959   12/16/21 2200  vancomycin (VANCOREADY) IVPB 1250 mg/250 mL        1,250 mg 166.7 mL/hr over 90 Minutes Intravenous Every 48 hours 12/15/21 2041  12/16/21 0600  ceFEPIme (MAXIPIME) 2 g in sodium chloride 0.9 % 100 mL IVPB        2 g 200 mL/hr over 30 Minutes Intravenous Every 24 hours 12/15/21 2041     12/15/21 2045  piperacillin-tazobactam (ZOSYN) IVPB 3.375 g  Status:  Discontinued        3.375 g 100 mL/hr over 30 Minutes Intravenous Every 6 hours 12/15/21 2041 12/15/21 2042   12/15/21 2030  ceFEPIme (MAXIPIME) 2 g in sodium chloride 0.9 % 100 mL IVPB  Status:  Discontinued        2 g 200 mL/hr over 30 Minutes Intravenous  Once 12/15/21 2020 12/15/21 2041   12/15/21 2030  metroNIDAZOLE (FLAGYL) IVPB 500 mg  Status:  Discontinued        500 mg 100 mL/hr over 60 Minutes Intravenous Every 12 hours 12/15/21 2020 12/15/21 2041   12/15/21 2030  vancomycin (VANCOCIN) IVPB 1000 mg/200 mL premix  Status:  Discontinued        1,000 mg 200 mL/hr over 60  Minutes Intravenous  Once 12/15/21 2020 12/15/21 2041   12/15/21 1945  vancomycin (VANCOCIN) IVPB 1000 mg/200 mL premix        1,000 mg 200 mL/hr over 60 Minutes Intravenous  Once 12/15/21 1944 12/15/21 2254   12/15/21 1945  piperacillin-tazobactam (ZOSYN) IVPB 3.375 g        3.375 g 100 mL/hr over 30 Minutes Intravenous  Once 12/15/21 1944 12/15/21 2254        I have personally reviewed the following labs and images: CBC: Recent Labs  Lab 12/15/21 1826 12/16/21 0548 12/17/21 0546 12/18/21 1039  WBC 24.3* 22.1* 17.2* 15.5*  NEUTROABS 10.5*  --  9.2* 9.2*  HGB 9.6* 8.9* 9.1* 9.3*  HCT 30.5* 28.3* 29.1* 29.8*  MCV 103.0* 103.3* 105.4* 105.3*  PLT 247 227 228 216   BMP &GFR Recent Labs  Lab 12/15/21 1826 12/16/21 0548 12/17/21 0546 12/17/21 0547 12/18/21 1039  NA 143 144  --  144 145  K 3.1* 3.1*  --  3.4* 4.1  CL 106 106  --  108 112*  CO2 30 31  --  26 24  GLUCOSE 114* 138*  --  104* 169*  BUN 87* 80*  --  69* 68*  CREATININE 3.16* 2.91*  --  2.75* 2.76*  2.80*  CALCIUM 9.8 9.3  --  9.1 9.4  MG  --  2.1 1.9  --  2.1  PHOS  --   --   --  3.1 2.8   Estimated Creatinine Clearance: 23.9 mL/min (A) (by C-G formula based on SCr of 2.8 mg/dL (H)). Liver & Pancreas: Recent Labs  Lab 12/15/21 1826 12/17/21 0547 12/18/21 1039  AST 25  --   --   ALT 27  --   --   ALKPHOS 88  --   --   BILITOT 0.4  --   --   PROT 7.6  --   --   ALBUMIN 3.2* 2.8* 3.1*   No results for input(s): "LIPASE", "AMYLASE" in the last 168 hours. No results for input(s): "AMMONIA" in the last 168 hours. Diabetic: No results for input(s): "HGBA1C" in the last 72 hours. Recent Labs  Lab 12/17/21 1752 12/17/21 1842 12/17/21 2110 12/18/21 0710 12/18/21 1201  GLUCAP 65* 92 122* 169* 149*   Cardiac Enzymes: No results for input(s): "CKTOTAL", "CKMB", "CKMBINDEX", "TROPONINI" in the last 168 hours. No results for input(s): "PROBNP" in the last 8760 hours. Coagulation  Profile: Recent  Labs  Lab 12/16/21 0548  INR 1.2   Thyroid Function Tests: No results for input(s): "TSH", "T4TOTAL", "FREET4", "T3FREE", "THYROIDAB" in the last 72 hours. Lipid Profile: No results for input(s): "CHOL", "HDL", "LDLCALC", "TRIG", "CHOLHDL", "LDLDIRECT" in the last 72 hours. Anemia Panel: No results for input(s): "VITAMINB12", "FOLATE", "FERRITIN", "TIBC", "IRON", "RETICCTPCT" in the last 72 hours. Urine analysis:    Component Value Date/Time   COLORURINE YELLOW 12/15/2021 2051   APPEARANCEUR CLEAR 12/15/2021 2051   LABSPEC 1.012 12/15/2021 2051   PHURINE 6.0 12/15/2021 2051   GLUCOSEU NEGATIVE 12/15/2021 2051   HGBUR NEGATIVE 12/15/2021 2051   [place] NEGATIVE 12/15/2021 2051   [place] NEGATIVE 12/15/2021 2051   PROTEINUR 30 (A) 12/15/2021 2051   NITRITE NEGATIVE 12/15/2021 2051   LEUKOCYTESUR LARGE (A) 12/15/2021 2051   Sepsis Labs: Invalid input(s): "PROCALCITONIN", "LACTICIDVEN"  Microbiology: Recent Results (from the past 240 hour(s))  Blood culture (routine x 2)     Status: None (Preliminary result)   Collection Time: 12/15/21  6:25 PM   Specimen: BLOOD  Result Value Ref Range Status   Specimen Description   Final    BLOOD BLOOD LEFT WRIST Performed at [facility] [address], [place], [place] [address]    Special Requests   Final    BOTTLES DRAWN AEROBIC AND ANAEROBIC Blood Culture results may not be optimal due to an excessive volume of blood received in culture bottles Performed at [facility] [address], [place], [place] [address]    Culture   Final    NO GROWTH 3 DAYS Performed at [facility], [place] [address] [place], [place] [address]    Report Status PENDING  Incomplete  Blood culture (routine x 2)     Status: None (Preliminary result)   Collection Time: 12/15/21  6:26 PM   Specimen: BLOOD  Result Value Ref Range Status   Specimen Description   Final    BLOOD BLOOD RIGHT WRIST Performed at  [facility] [address], [place], [place] [address]    Special Requests   Final    BOTTLES DRAWN AEROBIC AND ANAEROBIC Blood Culture adequate volume Performed at [facility] [address] [place], [place] [address]    Culture   Final    NO GROWTH 3 DAYS Performed at [facility], [place] [address], [place], [place] [address]    Report Status PENDING  Incomplete  Urine Culture     Status: None   Collection Time: 12/15/21  8:52 PM   Specimen: Urine, Clean Catch  Result Value Ref Range Status   Specimen Description   Final    URINE, CLEAN CATCH Performed at [facility], [place] [address], [place], [place] [address]    Special Requests   Final    NONE Performed at [facility], [place] [address], [place], [place] [address]    Culture   Final    NO GROWTH Performed at [facility], [place] [address], [place], [place] [address]    Report Status 12/16/2021 FINAL  Final  Aerobic Culture w Gram Stain (superficial specimen)     Status: None (Preliminary result)   Collection Time: 12/16/21  6:47 PM   Specimen: Wound  Result Value Ref Range Status   Specimen Description   Final    WOUND Performed at [facility] [address], [place], [place] [address]  Special Requests   Final    NONE MOUTH Performed at [facility] [address] [place], [place] [address]    Gram Stain   Final    NO WBC SEEN RARE GRAM POSITIVE COCCI IN CHAINS RARE GRAM POSITIVE COCCI IN PAIRS    Culture   Final    CULTURE REINCUBATED FOR BETTER GROWTH Performed at [facility], [place] [address] [place], [place] [address]    Report Status PENDING  Incomplete    Radiology Studies: No results found.    Aprel Egelhoff T. [place]  If 7PM-7AM, please contact night-coverage [email or web address] 12/18/2021, 3:04 PM

## 2021-12-19 DIAGNOSIS — D638 Anemia in other chronic diseases classified elsewhere: Secondary | ICD-10-CM | POA: Diagnosis not present

## 2021-12-19 DIAGNOSIS — C411 Malignant neoplasm of mandible: Secondary | ICD-10-CM | POA: Diagnosis not present

## 2021-12-19 DIAGNOSIS — D72829 Elevated white blood cell count, unspecified: Secondary | ICD-10-CM | POA: Diagnosis not present

## 2021-12-19 DIAGNOSIS — D72825 Bandemia: Secondary | ICD-10-CM | POA: Diagnosis not present

## 2021-12-19 LAB — CBC WITH DIFFERENTIAL/PLATELET
Abs Immature Granulocytes: 0.05 10*3/uL (ref 0.00–0.07)
Basophils Absolute: 0.1 10*3/uL (ref 0.0–0.1)
Basophils Relative: 1 %
Eosinophils Absolute: 2 10*3/uL — ABNORMAL HIGH (ref 0.0–0.5)
Eosinophils Relative: 14 %
HCT: 30.9 % — ABNORMAL LOW (ref 39.0–52.0)
Hemoglobin: 9.4 g/dL — ABNORMAL LOW (ref 13.0–17.0)
Immature Granulocytes: 0 %
Lymphocytes Relative: 23 %
Lymphs Abs: 3.4 10*3/uL (ref 0.7–4.0)
MCH: 32.1 pg (ref 26.0–34.0)
MCHC: 30.4 g/dL (ref 30.0–36.0)
MCV: 105.5 fL — ABNORMAL HIGH (ref 80.0–100.0)
Monocytes Absolute: 1.1 10*3/uL — ABNORMAL HIGH (ref 0.1–1.0)
Monocytes Relative: 7 %
Neutro Abs: 8 10*3/uL — ABNORMAL HIGH (ref 1.7–7.7)
Neutrophils Relative %: 55 %
Platelets: 204 10*3/uL (ref 150–400)
RBC: 2.93 MIL/uL — ABNORMAL LOW (ref 4.22–5.81)
RDW: 14.6 % (ref 11.5–15.5)
WBC: 14.6 10*3/uL — ABNORMAL HIGH (ref 4.0–10.5)
nRBC: 0 % (ref 0.0–0.2)

## 2021-12-19 LAB — GLUCOSE, CAPILLARY
Glucose-Capillary: 103 mg/dL — ABNORMAL HIGH (ref 70–99)
Glucose-Capillary: 128 mg/dL — ABNORMAL HIGH (ref 70–99)
Glucose-Capillary: 150 mg/dL — ABNORMAL HIGH (ref 70–99)
Glucose-Capillary: 118 mg/dL — ABNORMAL HIGH (ref 70–99)

## 2021-12-19 LAB — RENAL FUNCTION PANEL
Albumin: 3.2 g/dL — ABNORMAL LOW (ref 3.5–5.0)
Anion gap: 8 (ref 5–15)
BUN: 62 mg/dL — ABNORMAL HIGH (ref 8–23)
CO2: 21 mmol/L — ABNORMAL LOW (ref 22–32)
Calcium: 9.1 mg/dL (ref 8.9–10.3)
Chloride: 112 mmol/L — ABNORMAL HIGH (ref 98–111)
Creatinine, Ser: 2.42 mg/dL — ABNORMAL HIGH (ref 0.61–1.24)
GFR, Estimated: 29 mL/min — ABNORMAL LOW (ref >60)
Glucose, Bld: 120 mg/dL — ABNORMAL HIGH (ref 70–99)
Phosphorus: 2.4 mg/dL — ABNORMAL LOW (ref 2.5–4.6)
Potassium: 4.6 mmol/L (ref 3.5–5.1)
Sodium: 141 mmol/L (ref 135–145)

## 2021-12-19 LAB — CREATININE, SERUM
Creatinine, Ser: 2.42 mg/dL — ABNORMAL HIGH (ref 0.61–1.24)
GFR, Estimated: 29 mL/min — ABNORMAL LOW (ref >60)

## 2021-12-19 LAB — MAGNESIUM: Magnesium: 2 mg/dL (ref 1.7–2.4)

## 2021-12-19 LAB — AEROBIC CULTURE W GRAM STAIN (SUPERFICIAL SPECIMEN): Gram Stain: NONE SEEN

## 2021-12-19 NOTE — Progress Notes (Signed)
PROGRESS NOTE  Tilden Broz HGD:[id number] DOB: 27-Jan-1956   PCP: No primary care provider on file.  Patient is from: SNF  DOA: 12/15/2021 LOS: 4  Chief complaints Chief Complaint  Patient presents with   Abnormal Labs     Brief Narrative / Interim history: 66 year old M with PMH of stage IVA mandibular SCC, DM-2, CKD-4, CVA, HTN, dysphagia with tube feed dependent and recent hospitalization from 6/27-6/29 for bilateral aspiration pneumonia and AKI when he left AMA to go to his outpatient appointments returning from SNF for leukocytosis and left oral swelling.  Patient was on IV ceftriaxone via PICC line.   In ED, slightly hypertensive.  K3.1. Cr 3.16 (4.0 on 6/29).  BUN 87.  WBC 24.3 with eosinophilia and some left shift.  CXR with improved lung aeration, stable cardiomegaly and PICC line projecting over the right axilla.  Cultures obtained.  Started on IV vancomycin and Zosyn and admitted for leukocytosis.  ENT consulted.  Blood cultures NGTD.  Patient was evaluated by ENT.  Superficial wound culture sent and gram stain with GPC in chains and pairs.  Culture with rare Pseudomonas aeruginosa.  Per radiation oncology, patient's oral surgeons at [facility] recommended IV Unasyn and vancomycin until cultures are finalized. Patient remains on IV cefepime, Flagyl and vancomycin.    Subjective: Seen and examined earlier this morning.  No major events overnight of this morning.  No complaints.  He denies pain, shortness of breath, GI or UTI symptoms.  Objective: Vitals:   12/17/21 0531 12/18/21 0455 12/18/21 1400 12/18/21 2037  BP: (!) 144/84 (!) 141/75 (!) 152/77 (!) 179/81  Pulse: 83 68 61 69  Resp: '20 18 20 20  '$ Temp: 97.7 F (36.5 C) 97.6 F (36.4 C) 98 F (36.7 C) 98.2 F (36.8 C)  TempSrc: Oral Oral Axillary Axillary  SpO2: 98% 98% 100% 99%  Weight:      Height:        Examination:  GENERAL: No apparent distress.  Nontoxic. HEENT: MMM.  Bulky soft tissue flap in left  oral floor.  Dressing over left neck incision site DCI. NECK: Supple.  No apparent JVD.  RESP:  No IWOB.  Fair aeration bilaterally. CVS:  RRR. Heart sounds normal.  ABD/GI/GU: BS+. Abd soft, NTND.  MSK/EXT:  Moves extremities. No apparent deformity. No edema.  SKIN: no apparent skin lesion or wound NEURO: Awake and alert. Oriented appropriately.  No apparent focal neuro deficit. PSYCH: Calm. Normal affect.   Procedures:  None  Microbiology summarized: Urine culture NGTD Blood culture NGTD Superficial culture with Pseudomonas aeruginosa.  Gram stain with rare GPC's in pairs and chains.  Assessment and plan: Principal Problem:   Leukocytosis Active Problems:   Hypokalemia   Chronic kidney disease, stage IV (severe) (HCC)   DM (diabetes mellitus), type 2 with renal complications (HCC)   History of stroke   PAD (peripheral artery disease) (HCC)   Cancer of mandible (HCC)   Essential hypertension   Dyslipidemia   GERD without esophagitis   Oropharyngeal dysphagia   Anemia of chronic disease   Eosinophilia   Bandemia   Malnutrition of moderate degree   Stage IV left mandibular cancer s/p free flap reconstruction with associated infection/abscess  Leukocytosis with eosinophilia and some degree of bandemia likely due to the above -Blood cultures NGTD.  Superficial culture with rare P.  aeruginosa.  GS with GPC in pairs and chains -Continue cefepime, vancomycin and Flagyl  -Follow superficial wound culture sensitivity -ID consult in the  morning. -Appreciate input by ENT -Radiation oncology on board  CKD-4/azotemia: Improving. Recent Labs    08/06/21 1017 09/14/21 0850 12/07/21 2241 12/08/21 0351 12/08/21 1416 12/09/21 0507 12/15/21 1826 12/16/21 0548 12/17/21 0547 12/18/21 1039  BUN 111* 35* 135* 122* 112* 113* 87* 80* 69* 68*  CREATININE 5.50* 2.64* 4.11* 4.08* 4.28* 4.00* 3.16* 2.91* 2.75* 2.76*  2.80*  -Continue IV fluid -Avoid nephrotoxic meds  IDDM-2  with CKD-4, PAD and other complications: H2D 9.2% on 08/03/2021 Recent Labs  Lab 12/18/21 0710 12/18/21 1201 12/18/21 1617 12/18/21 2127 12/19/21 0707  GLUCAP 169* 149* 134* 206* 103*  -Continue sliding scale insulin -Check hemoglobin A1c  Anemia of chronic disease: Relatively stable. Recent Labs    08/03/21 1019 08/06/21 1017 09/14/21 0850 12/07/21 2241 12/09/21 0507 12/15/21 1826 12/16/21 0548 12/17/21 0546 12/18/21 1039 12/19/21 1036  HGB 9.9* 10.2* 9.3* 9.8* 8.9* 9.6* 8.9* 9.1* 9.3* 9.4*  -Continue monitoring  Hypokalemia: Resolved   GERD without esophagitis -Continue PPI therapy.  Dyslipidemia -Continue statin   Essential hypertension: BP within acceptable range -Continue home meds  History of CVA Physical deconditioning -PT/OT eval  Oropharyngeal dysphagia Moderate malnutrition Body mass index is 25.21 kg/m. Nutrition Problem: Moderate Malnutrition Etiology: chronic illness, cancer and cancer related treatments Signs/Symptoms: moderate fat depletion, moderate muscle depletion, severe muscle depletion Interventions: Tube feeding, Prostat   DVT prophylaxis:  enoxaparin (LOVENOX) injection 30 mg Start: 12/15/21 2200  Code Status: Full code Family Communication: None at bedside Level of care: Med-Surg Status is: Inpatient Remains inpatient appropriate because: Oral infection and leukocytosis   Final disposition: Likely back to SNF once medically cleared Consultants:  Radiation oncology ENT  Sch Meds:  Scheduled Meds:  atorvastatin  40 mg Per Tube Daily   carvedilol  25 mg Per Tube Q12H   chlorhexidine  15 mL Mouth/Throat TID   enoxaparin (LOVENOX) injection  30 mg Subcutaneous Q24H   feeding supplement (NEPRO CARB STEADY)  237 mL Per Tube 5 X Daily   feeding supplement (PROSource TF)  45 mL Per Tube BID   ferrous sulfate  300 mg Per Tube Daily   free water  200 mL Per Tube 6 X Daily   gabapentin  300 mg Per Tube QHS   hydrALAZINE  100 mg  Per Tube TID   insulin aspart  0-15 Units Subcutaneous TID WC   insulin aspart  0-5 Units Subcutaneous QHS   insulin glargine-yfgn  8 Units Subcutaneous Daily   isosorbide dinitrate  20 mg Per Tube TID   lactobacillus  1 g Per Tube Daily   pantoprazole sodium  40 mg Per Tube Daily   terazosin  2 mg Per Tube Daily   Continuous Infusions:  ceFEPime (MAXIPIME) IV 2 g (12/19/21 0547)   metronidazole 500 mg (12/18/21 2128)   vancomycin 1,250 mg (12/18/21 2237)   PRN Meds:.acetaminophen (TYLENOL) oral liquid 160 mg/5 mL **OR** acetaminophen, labetalol, melatonin, ondansetron **OR** ondansetron (ZOFRAN) IV, oxyCODONE, polyethylene glycol, senna-docusate, traZODone  Antimicrobials: Anti-infectives (From admission, onward)    Start     Dose/Rate Route Frequency Ordered Stop   12/17/21 1000  metroNIDAZOLE (FLAGYL) IVPB 500 mg        500 mg 100 mL/hr over 60 Minutes Intravenous Every 12 hours 12/17/21 0814 12/24/21 0959   12/16/21 2200  vancomycin (VANCOREADY) IVPB 1250 mg/250 mL        1,250 mg 166.7 mL/hr over 90 Minutes Intravenous Every 48 hours 12/15/21 2041     12/16/21 0600  ceFEPIme (MAXIPIME)  2 g in sodium chloride 0.9 % 100 mL IVPB        2 g 200 mL/hr over 30 Minutes Intravenous Every 24 hours 12/15/21 2041     12/15/21 2045  piperacillin-tazobactam (ZOSYN) IVPB 3.375 g  Status:  Discontinued        3.375 g 100 mL/hr over 30 Minutes Intravenous Every 6 hours 12/15/21 2041 12/15/21 2042   12/15/21 2030  ceFEPIme (MAXIPIME) 2 g in sodium chloride 0.9 % 100 mL IVPB  Status:  Discontinued        2 g 200 mL/hr over 30 Minutes Intravenous  Once 12/15/21 2020 12/15/21 2041   12/15/21 2030  metroNIDAZOLE (FLAGYL) IVPB 500 mg  Status:  Discontinued        500 mg 100 mL/hr over 60 Minutes Intravenous Every 12 hours 12/15/21 2020 12/15/21 2041   12/15/21 2030  vancomycin (VANCOCIN) IVPB 1000 mg/200 mL premix  Status:  Discontinued        1,000 mg 200 mL/hr over 60 Minutes Intravenous   Once 12/15/21 2020 12/15/21 2041   12/15/21 1945  vancomycin (VANCOCIN) IVPB 1000 mg/200 mL premix        1,000 mg 200 mL/hr over 60 Minutes Intravenous  Once 12/15/21 1944 12/15/21 2254   12/15/21 1945  piperacillin-tazobactam (ZOSYN) IVPB 3.375 g        3.375 g 100 mL/hr over 30 Minutes Intravenous  Once 12/15/21 1944 12/15/21 2254        I have personally reviewed the following labs and images: CBC: Recent Labs  Lab 12/15/21 1826 12/16/21 0548 12/17/21 0546 12/18/21 1039 12/19/21 1036  WBC 24.3* 22.1* 17.2* 15.5* 14.6*  NEUTROABS 10.5*  --  9.2* 9.2* 8.0*  HGB 9.6* 8.9* 9.1* 9.3* 9.4*  HCT 30.5* 28.3* 29.1* 29.8* 30.9*  MCV 103.0* 103.3* 105.4* 105.3* 105.5*  PLT 247 227 228 216 204   BMP &GFR Recent Labs  Lab 12/15/21 1826 12/16/21 0548 12/17/21 0546 12/17/21 0547 12/18/21 1039  NA 143 144  --  144 145  K 3.1* 3.1*  --  3.4* 4.1  CL 106 106  --  108 112*  CO2 30 31  --  26 24  GLUCOSE 114* 138*  --  104* 169*  BUN 87* 80*  --  69* 68*  CREATININE 3.16* 2.91*  --  2.75* 2.76*  2.80*  CALCIUM 9.8 9.3  --  9.1 9.4  MG  --  2.1 1.9  --  2.1  PHOS  --   --   --  3.1 2.8   Estimated Creatinine Clearance: 23.9 mL/min (A) (by C-G formula based on SCr of 2.8 mg/dL (H)). Liver & Pancreas: Recent Labs  Lab 12/15/21 1826 12/17/21 0547 12/18/21 1039  AST 25  --   --   ALT 27  --   --   ALKPHOS 88  --   --   BILITOT 0.4  --   --   PROT 7.6  --   --   ALBUMIN 3.2* 2.8* 3.1*   No results for input(s): "LIPASE", "AMYLASE" in the last 168 hours. No results for input(s): "AMMONIA" in the last 168 hours. Diabetic: No results for input(s): "HGBA1C" in the last 72 hours. Recent Labs  Lab 12/18/21 0710 12/18/21 1201 12/18/21 1617 12/18/21 2127 12/19/21 0707  GLUCAP 169* 149* 134* 206* 103*   Cardiac Enzymes: No results for input(s): "CKTOTAL", "CKMB", "CKMBINDEX", "TROPONINI" in the last 168 hours. No results for input(s): "PROBNP" in the last  8760  hours. Coagulation Profile: Recent Labs  Lab 12/16/21 0548  INR 1.2   Thyroid Function Tests: No results for input(s): "TSH", "T4TOTAL", "FREET4", "T3FREE", "THYROIDAB" in the last 72 hours. Lipid Profile: No results for input(s): "CHOL", "HDL", "LDLCALC", "TRIG", "CHOLHDL", "LDLDIRECT" in the last 72 hours. Anemia Panel: No results for input(s): "VITAMINB12", "FOLATE", "FERRITIN", "TIBC", "IRON", "RETICCTPCT" in the last 72 hours. Urine analysis:    Component Value Date/Time   COLORURINE YELLOW 12/15/2021 2051   APPEARANCEUR CLEAR 12/15/2021 2051   LABSPEC 1.012 12/15/2021 2051   PHURINE 6.0 12/15/2021 2051   GLUCOSEU NEGATIVE 12/15/2021 2051   HGBUR NEGATIVE 12/15/2021 2051   [place] NEGATIVE 12/15/2021 2051   [place] NEGATIVE 12/15/2021 2051   PROTEINUR 30 (A) 12/15/2021 2051   NITRITE NEGATIVE 12/15/2021 2051   LEUKOCYTESUR LARGE (A) 12/15/2021 2051   Sepsis Labs: Invalid input(s): "PROCALCITONIN", "LACTICIDVEN"  Microbiology: Recent Results (from the past 240 hour(s))  Blood culture (routine x 2)     Status: None (Preliminary result)   Collection Time: 12/15/21  6:25 PM   Specimen: BLOOD  Result Value Ref Range Status   Specimen Description   Final    BLOOD BLOOD LEFT WRIST Performed at [facility] [address] [place], [place] [address]    Special Requests   Final    BOTTLES DRAWN AEROBIC AND ANAEROBIC Blood Culture results may not be optimal due to an excessive volume of blood received in culture bottles Performed at [facility] [address], [place], [place] [address]    Culture   Final    NO GROWTH 4 DAYS Performed at [facility], [place] [address], [place], [place] [address]    Report Status PENDING  Incomplete  Blood culture (routine x 2)     Status: None (Preliminary result)   Collection Time: 12/15/21  6:26 PM   Specimen: BLOOD  Result Value Ref Range Status   Specimen Description   Final     BLOOD BLOOD RIGHT WRIST Performed at [facility] [address] [place], [place] [address]    Special Requests   Final    BOTTLES DRAWN AEROBIC AND ANAEROBIC Blood Culture adequate volume Performed at [facility] [address] [place], [place] [address]    Culture   Final    NO GROWTH 4 DAYS Performed at [facility], [place] [address], [place],  [place] [address]    Report Status PENDING  Incomplete  Urine Culture     Status: None   Collection Time: 12/15/21  8:52 PM   Specimen: Urine, Clean Catch  Result Value Ref Range Status   Specimen Description   Final    URINE, CLEAN CATCH Performed at [facility], [place] [address], [place], [place] [address]    Special Requests   Final    NONE Performed at [facility], [place] [address], [place], [place] [address]    Culture   Final    NO GROWTH Performed at [facility], [place] [address], [place], [place] [address]    Report Status 12/16/2021 FINAL  Final  Aerobic Culture w Gram Stain (superficial specimen)     Status: None (Preliminary result)   Collection Time: 12/16/21  6:47 PM   Specimen: Wound  Result Value Ref Range Status   Specimen Description   Final    WOUND Performed at [facility] [address] [place], [place]  [address]    Special Requests   Final    NONE MOUTH Performed at [facility] [address], [place], [place] [address]    Gram Stain   Final    NO WBC SEEN RARE GRAM POSITIVE COCCI IN CHAINS RARE GRAM POSITIVE COCCI IN PAIRS    Culture   Final    RARE PSEUDOMONAS AERUGINOSA SUSCEPTIBILITIES TO FOLLOW Performed at [facility], [place] [address], [place], [place] [address]    Report Status PENDING  Incomplete    Radiology Studies: No results found.    Puneet Masoner T. [place]  If 7PM-7AM, please contact night-coverage [email or web address] 12/19/2021, 11:11 AM

## 2021-12-19 NOTE — Plan of Care (Signed)
  Problem: Fluid Volume: Goal: Hemodynamic stability will improve Outcome: Progressing   

## 2021-12-19 NOTE — Progress Notes (Signed)
Patient sat up in chair all day. He occasionally walked around his room using a front wheel walker.

## 2021-12-20 ENCOUNTER — Ambulatory Visit
Admission: RE | Admit: 2021-12-20 | Discharge: 2021-12-20 | Disposition: A | Discharge status: Home / Self Care | Payer: No Typology Code available for payment source | Source: Ambulatory Visit | Attending: Radiation Oncology | Admitting: Radiation Oncology

## 2021-12-20 ENCOUNTER — Other Ambulatory Visit: Payer: Self-pay

## 2021-12-20 DIAGNOSIS — E1122 Type 2 diabetes mellitus with diabetic chronic kidney disease: Secondary | ICD-10-CM

## 2021-12-20 DIAGNOSIS — A498 Other bacterial infections of unspecified site: Secondary | ICD-10-CM

## 2021-12-20 DIAGNOSIS — L089 Local infection of the skin and subcutaneous tissue, unspecified: Secondary | ICD-10-CM

## 2021-12-20 DIAGNOSIS — B182 Chronic viral hepatitis C: Secondary | ICD-10-CM

## 2021-12-20 DIAGNOSIS — D72829 Elevated white blood cell count, unspecified: Secondary | ICD-10-CM | POA: Diagnosis not present

## 2021-12-20 DIAGNOSIS — D72825 Bandemia: Secondary | ICD-10-CM | POA: Diagnosis not present

## 2021-12-20 DIAGNOSIS — C411 Malignant neoplasm of mandible: Secondary | ICD-10-CM | POA: Diagnosis not present

## 2021-12-20 DIAGNOSIS — D638 Anemia in other chronic diseases classified elsewhere: Secondary | ICD-10-CM | POA: Diagnosis not present

## 2021-12-20 DIAGNOSIS — N179 Acute kidney failure, unspecified: Secondary | ICD-10-CM

## 2021-12-20 DIAGNOSIS — N1832 Chronic kidney disease, stage 3b: Secondary | ICD-10-CM

## 2021-12-20 LAB — RENAL FUNCTION PANEL
Albumin: 2.8 g/dL — ABNORMAL LOW (ref 3.5–5.0)
Anion gap: 9 (ref 5–15)
BUN: 64 mg/dL — ABNORMAL HIGH (ref 8–23)
CO2: 20 mmol/L — ABNORMAL LOW (ref 22–32)
Calcium: 9 mg/dL (ref 8.9–10.3)
Chloride: 114 mmol/L — ABNORMAL HIGH (ref 98–111)
Creatinine, Ser: 2.39 mg/dL — ABNORMAL HIGH (ref 0.61–1.24)
GFR, Estimated: 29 mL/min — ABNORMAL LOW
Glucose, Bld: 84 mg/dL (ref 70–99)
Phosphorus: 2.3 mg/dL — ABNORMAL LOW (ref 2.5–4.6)
Potassium: 3.8 mmol/L (ref 3.5–5.1)
Sodium: 143 mmol/L (ref 135–145)

## 2021-12-20 LAB — CBC
HCT: 27.8 % — ABNORMAL LOW (ref 39.0–52.0)
Hemoglobin: 8.5 g/dL — ABNORMAL LOW (ref 13.0–17.0)
MCH: 32.6 pg (ref 26.0–34.0)
MCHC: 30.6 g/dL (ref 30.0–36.0)
MCV: 106.5 fL — ABNORMAL HIGH (ref 80.0–100.0)
Platelets: 201 10*3/uL (ref 150–400)
RBC: 2.61 MIL/uL — ABNORMAL LOW (ref 4.22–5.81)
RDW: 14.7 % (ref 11.5–15.5)
WBC: 12.7 10*3/uL — ABNORMAL HIGH (ref 4.0–10.5)
nRBC: 0 % (ref 0.0–0.2)

## 2021-12-20 LAB — RAD ONC ARIA SESSION SUMMARY
Course Elapsed Days: 5
Plan Fractions Treated to Date: 3
Plan Prescribed Dose Per Fraction: 2 Gy
Plan Total Fractions Prescribed: 30
Plan Total Prescribed Dose: 60 Gy
Reference Point Dosage Given to Date: 6 Gy
Reference Point Session Dosage Given: 2 Gy
Session Number: 3

## 2021-12-20 LAB — CREATININE, SERUM
Creatinine, Ser: 2.37 mg/dL — ABNORMAL HIGH (ref 0.61–1.24)
GFR, Estimated: 29 mL/min — ABNORMAL LOW (ref >60)

## 2021-12-20 LAB — GLUCOSE, CAPILLARY
Glucose-Capillary: 163 mg/dL — ABNORMAL HIGH (ref 70–99)
Glucose-Capillary: 156 mg/dL — ABNORMAL HIGH (ref 70–99)
Glucose-Capillary: 133 mg/dL — ABNORMAL HIGH (ref 70–99)
Glucose-Capillary: 113 mg/dL — ABNORMAL HIGH (ref 70–99)

## 2021-12-20 LAB — HEMOGLOBIN A1C
Hgb A1c MFr Bld: 6.2 % — ABNORMAL HIGH (ref 4.8–5.6)
Mean Plasma Glucose: 131.24 mg/dL

## 2021-12-20 LAB — CULTURE, BLOOD (ROUTINE X 2)
Culture: NO GROWTH
Culture: NO GROWTH
Special Requests: ADEQUATE

## 2021-12-20 LAB — MAGNESIUM: Magnesium: 2.1 mg/dL (ref 1.7–2.4)

## 2021-12-20 MED ORDER — AMLODIPINE BESYLATE 5 MG PO TABS: 5.0000 mg | ORAL_TABLET | Freq: Every day | ORAL | Status: DC

## 2021-12-20 MED ORDER — ISOSORBIDE DINITRATE 20 MG PO TABS
30.0000 mg | ORAL_TABLET | Freq: Three times a day (TID) | ORAL | Status: DC
  Administered 2021-12-20 – 2021-12-21 (×3): 30 mg
  Filled 2021-12-20 (×3): qty 1

## 2021-12-20 NOTE — Progress Notes (Signed)
Provided education to patient about peg tube and administering medications, along with tube feeds and free water. Patient demonstrated skills with assistance and tolerated well.

## 2021-12-20 NOTE — Progress Notes (Addendum)
PHARMACY CONSULT NOTE FOR:  OUTPATIENT  PARENTERAL ANTIBIOTIC THERAPY (OPAT)  Indication: Pseudomonas wound infection Regimen: Cefepime 2g IV every 24 hours (renally reduced) and Flagyl 500 mg per tube every 12 hours End date: 12/29/21  IV antibiotic discharge orders are pended. To discharging provider:  please sign these orders via discharge navigator,  Select New Orders & click on the button choice - Manage This Unsigned Work.     Thank you for allowing pharmacy to be a part of this patient's care.  Alycia Rossetti, PharmD, BCPS Infectious Diseases Clinical Pharmacist 12/20/2021 9:33 AM   **Pharmacist phone directory can now be found on [email or web address] ([email or web address]).  Listed under [place].

## 2021-12-20 NOTE — Progress Notes (Signed)
PROGRESS NOTE  John Jacobson LZJ:[id number] DOB: Jan 07, 1956   PCP: No primary care provider on file.  Patient is from: SNF  DOA: 12/15/2021 LOS: 5  Chief complaints Chief Complaint  Patient presents with   Abnormal Labs     Brief Narrative / Interim history: 66 year old M with PMH of stage IVA mandibular SCC, DM-2, CKD-3B, CVA, HTN, dysphagia with tube feed dependent and recent hospitalization from 6/27-6/29 for bilateral aspiration pneumonia and AKI when he left AMA to go to his outpatient appointments returning from SNF for leukocytosis and left oral swelling.  Patient was on IV ceftriaxone via PICC line.   In ED, slightly hypertensive.  K3.1. Cr 3.16 (4.0 on 6/29).  BUN 87.  WBC 24.3 with eosinophilia and some left shift.  CXR with improved lung aeration, stable cardiomegaly and PICC line projecting over the right axilla.  Cultures obtained.  Started on IV vancomycin and Zosyn and admitted for leukocytosis.  ENT consulted.  Blood cultures NGTD.  Patient was evaluated by ENT.  Superficial wound culture sent and gram stain with GPC in chains and pairs.  Culture with pansensitive Pseudomonas aeruginosa.  ID consulted.  Antibiotic de-escalated to IV cefepime  Subjective: Seen and examined earlier this morning.  No major events overnight of this morning.  Feels some discomfort in his oral cavity.  No other complaints.  Patient's daughter available over video call during this encounter.  Objective: Vitals:   12/19/21 1331 12/19/21 1858 12/19/21 2121 12/20/21 0519  BP: (!) 154/68 (!) 154/68 (!) 149/74 (!) 160/74  Pulse: 67  65 66  Resp: '19  20 18  '$ Temp: 98 F (36.7 C)  97.9 F (36.6 C) (!) 97.4 F (36.3 C)  TempSrc:   Axillary Oral  SpO2: 100%  100% 100%  Weight:      Height:        Examination:  GENERAL: No apparent distress.  Nontoxic. HEENT: Mucous membranes slightly dry.  Bulky soft tissue flap in left oral floor.  No apparent drainage.  Dressing over left neck  incision site DCI. NECK: Supple.  No apparent JVD.  RESP:  No IWOB.  Fair aeration bilaterally. CVS:  RRR. Heart sounds normal.  ABD/GI/GU: BS+. Abd soft, NTND.  G-tube in place. MSK/EXT:  Moves extremities. No apparent deformity. No edema.  SKIN: no apparent skin lesion or wound NEURO: Awake and alert. Oriented appropriately.  No apparent focal neuro deficit. PSYCH: Calm. Normal affect.   Procedures:  None  Microbiology summarized: Urine culture NGTD Blood culture NGTD Superficial culture with Pseudomonas aeruginosa.  Gram stain with rare GPC's in pairs and chains.  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 -Blood cultures NGTD.  Superficial culture with pansensitive P.  aeruginosa.  GS with GPC in pairs and chains -Continue cefepime, vancomycin and Flagyl>> IV cefepime per infectious disease -Appreciate input by ENT-outpatient follow-up with his  oral surgeon at the [place] [facility] on board  AKI/azotemia superimposed on CKD-3B: Improving. Recent Labs    12/07/21 2241 12/08/21 0351 12/08/21 1416 12/09/21 0507 12/15/21 1826 12/16/21 0548 12/17/21 0547 12/18/21 1039 12/19/21 1036 12/20/21 0554  BUN 135* 122* 112* 113* 87* 80* 69* 68* 62* 64*  CREATININE 4.11* 4.08* 4.28* 4.00* 3.16* 2.91* 2.75* 2.76*  2.80* 2.42*  2.42* 2.39*  2.37*   -Avoid nephrotoxic meds.  IDDM-2 with CKD-4, PAD and other complications: T5H 7.4% on 08/03/2021 Recent Labs  Lab 12/19/21 1155 12/19/21 1630 12/19/21 2138 12/20/21 0723 12/20/21 1149  GLUCAP 128* 150* 118* 163* 156*  -Continue sliding scale insulin -Check hemoglobin A1c  Anemia of chronic disease: Relatively stable. Recent Labs    08/06/21 1017 09/14/21 0850 12/07/21 2241 12/09/21 0507 12/15/21 1826 12/16/21 0548 12/17/21 0546 12/18/21 1039 12/19/21 1036 12/20/21 0554  HGB 10.2* 9.3* 9.8* 8.9* 9.6* 8.9* 9.1* 9.3* 9.4* 8.5*  -Continue monitoring -Check anemia panel  Hypokalemia: Resolved   GERD without esophagitis -Continue PPI therapy.  Dyslipidemia -Continue statin   Essential hypertension: BP elevated but improved. -Continue Coreg 25 mg twice daily -Increase Isordil to 30 mg 3 times daily -Continue hydralazine 100 mg 3 times daily -IV labetalol as needed  History of CVA Physical deconditioning -PT/OT eval  Oropharyngeal dysphagia Moderate malnutrition SLP-recommends thin liquids via teaspoon Body mass index is 25.21 kg/m. Nutrition Problem: Moderate Malnutrition Etiology: chronic illness, cancer and cancer related treatments Signs/Symptoms: moderate fat depletion, moderate muscle depletion, severe muscle depletion Interventions: Tube feeding, Prostat   DVT prophylaxis:  enoxaparin (LOVENOX) injection 30 mg Start: 12/15/21 2200  Code Status: Full code Family Communication: Updated patient's daughter over the phone Level of care: Med-Surg Status is: Inpatient Remains inpatient appropriate because: Oral infection and leukocytosis   Final disposition: Likely back to SNF once medically cleared Consultants:  Radiation oncology ENT Infectious disease  Sch Meds:  Scheduled Meds:  atorvastatin  40 mg Per Tube Daily   carvedilol  25 mg Per Tube Q12H   chlorhexidine  15 mL Mouth/Throat TID   enoxaparin (LOVENOX) injection  30 mg Subcutaneous  Q24H   feeding supplement (NEPRO CARB STEADY)  237 mL Per Tube 5 X Daily   feeding supplement (PROSource TF)  45 mL Per Tube BID   ferrous sulfate  300 mg Per Tube Daily   free water  200 mL Per Tube 6 X Daily   gabapentin  300 mg Per Tube QHS   hydrALAZINE  100 mg Per Tube TID   insulin aspart  0-15 Units Subcutaneous TID WC   insulin aspart  0-5 Units Subcutaneous QHS   insulin glargine-yfgn  8 Units Subcutaneous Daily   isosorbide dinitrate  30 mg Per Tube TID   lactobacillus  1 g Per Tube Daily   pantoprazole sodium  40 mg Per Tube Daily   terazosin  2 mg Per Tube Daily   Continuous Infusions:  ceFEPime (MAXIPIME) IV 2 g (12/20/21 1638)   metronidazole 500 mg (12/20/21 1140)   PRN Meds:.acetaminophen (TYLENOL) oral liquid 160 mg/5 mL **OR** acetaminophen, labetalol, melatonin, ondansetron **OR** ondansetron (ZOFRAN) IV, oxyCODONE, polyethylene glycol, senna-docusate, traZODone  Antimicrobials: Anti-infectives (From admission, onward)    Start     Dose/Rate Route Frequency Ordered Stop   12/17/21 1000  metroNIDAZOLE (FLAGYL) IVPB 500 mg        500 mg 100 mL/hr over 60 Minutes Intravenous Every 12 hours 12/17/21 0814 12/24/21 0959   12/16/21 2200  vancomycin (VANCOREADY) IVPB  1250 mg/250 mL  Status:  Discontinued        1,250 mg 166.7 mL/hr over 90 Minutes Intravenous Every 48 hours 12/15/21 2041 12/20/21 1129   12/16/21 0600  ceFEPIme (MAXIPIME) 2 g in sodium chloride 0.9 % 100 mL IVPB        2 g 200 mL/hr over 30 Minutes Intravenous Every 24 hours 12/15/21 2041     12/15/21 2045  piperacillin-tazobactam (ZOSYN) IVPB 3.375 g  Status:  Discontinued        3.375 g 100 mL/hr over 30 Minutes Intravenous Every 6 hours 12/15/21 2041 12/15/21 2042   12/15/21 2030  ceFEPIme (MAXIPIME) 2 g in sodium chloride 0.9 % 100 mL IVPB  Status:  Discontinued        2 g 200 mL/hr over 30 Minutes Intravenous  Once 12/15/21 2020 12/15/21 2041   12/15/21 2030  metroNIDAZOLE (FLAGYL) IVPB 500 mg   Status:  Discontinued        500 mg 100 mL/hr over 60 Minutes Intravenous Every 12 hours 12/15/21 2020 12/15/21 2041   12/15/21 2030  vancomycin (VANCOCIN) IVPB 1000 mg/200 mL premix  Status:  Discontinued        1,000 mg 200 mL/hr over 60 Minutes Intravenous  Once 12/15/21 2020 12/15/21 2041   12/15/21 1945  vancomycin (VANCOCIN) IVPB 1000 mg/200 mL premix        1,000 mg 200 mL/hr over 60 Minutes Intravenous  Once 12/15/21 1944 12/15/21 2254   12/15/21 1945  piperacillin-tazobactam (ZOSYN) IVPB 3.375 g        3.375 g 100 mL/hr over 30 Minutes Intravenous  Once 12/15/21 1944 12/15/21 2254        I have personally reviewed the following labs and images: CBC: Recent Labs  Lab 12/15/21 1826 12/16/21 0548 12/17/21 0546 12/18/21 1039 12/19/21 1036 12/20/21 0554  WBC 24.3* 22.1* 17.2* 15.5* 14.6* 12.7*  NEUTROABS 10.5*  --  9.2* 9.2* 8.0*  --   HGB 9.6* 8.9* 9.1* 9.3* 9.4* 8.5*  HCT 30.5* 28.3* 29.1* 29.8* 30.9* 27.8*  MCV 103.0* 103.3* 105.4* 105.3* 105.5* 106.5*  PLT 247 227 228 216 204 201   BMP &GFR Recent Labs  Lab 12/16/21 0548 12/17/21 0546 12/17/21 0547 12/18/21 1039 12/19/21 1036 12/20/21 0554  NA 144  --  144 145 141 143  K 3.1*  --  3.4* 4.1 4.6 3.8  CL 106  --  108 112* 112* 114*  CO2 31  --  26 24 21* 20*  GLUCOSE 138*  --  104* 169* 120* 84  BUN 80*  --  69* 68* 62* 64*  CREATININE 2.91*  --  2.75* 2.76*  2.80* 2.42*  2.42* 2.39*  2.37*  CALCIUM 9.3  --  9.1 9.4 9.1 9.0  MG 2.1 1.9  --  2.1 2.0 2.1  PHOS  --   --  3.1 2.8 2.4* 2.3*   Estimated Creatinine Clearance: 28.3 mL/min (A) (by C-G formula based on SCr of 2.37 mg/dL (H)). Liver & Pancreas: Recent Labs  Lab 12/15/21 1826 12/17/21 0547 12/18/21 1039 12/19/21 1036 12/20/21 0554  AST 25  --   --   --   --   ALT 27  --   --   --   --   ALKPHOS 88  --   --   --   --   BILITOT 0.4  --   --   --   --   PROT 7.6  --   --   --   --  ALBUMIN 3.2* 2.8* 3.1* 3.2* 2.8*   No results for  input(s): "LIPASE", "AMYLASE" in the last 168 hours. No results for input(s): "AMMONIA" in the last 168 hours. Diabetic: No results for input(s): "HGBA1C" in the last 72 hours. Recent Labs  Lab 12/19/21 1155 12/19/21 1630 12/19/21 2138 12/20/21 0723 12/20/21 1149  GLUCAP 128* 150* 118* 163* 156*   Cardiac Enzymes: No results for input(s): "CKTOTAL", "CKMB", "CKMBINDEX", "TROPONINI" in the last 168 hours. No results for input(s): "PROBNP" in the last 8760 hours. Coagulation Profile: Recent Labs  Lab 12/16/21 0548  INR 1.2   Thyroid Function Tests: No results for input(s): "TSH", "T4TOTAL", "FREET4", "T3FREE", "THYROIDAB" in the last 72 hours. Lipid Profile: No results for input(s): "CHOL", "HDL", "LDLCALC", "TRIG", "CHOLHDL", "LDLDIRECT" in the last 72 hours. Anemia Panel: No results for input(s): "VITAMINB12", "FOLATE", "FERRITIN", "TIBC", "IRON", "RETICCTPCT" in the last 72 hours. Urine analysis:    Component Value Date/Time   COLORURINE YELLOW 12/15/2021 2051   APPEARANCEUR CLEAR 12/15/2021 2051   LABSPEC 1.012 12/15/2021 2051   PHURINE 6.0 12/15/2021 2051   GLUCOSEU NEGATIVE 12/15/2021 2051   HGBUR NEGATIVE 12/15/2021 2051   [place] NEGATIVE 12/15/2021 2051   [place] NEGATIVE 12/15/2021 2051   PROTEINUR 30 (A) 12/15/2021 2051   NITRITE NEGATIVE 12/15/2021 2051   LEUKOCYTESUR LARGE (A) 12/15/2021 2051   Sepsis Labs: Invalid input(s): "PROCALCITONIN", "LACTICIDVEN"  Microbiology: Recent Results (from the past 240 hour(s))  Blood culture (routine x 2)     Status: None   Collection Time: 12/15/21  6:25 PM   Specimen: BLOOD  Result Value Ref Range Status   Specimen Description   Final    BLOOD BLOOD LEFT WRIST Performed at [facility] [address], [place], [place] [address]    Special Requests   Final    BOTTLES DRAWN AEROBIC AND ANAEROBIC Blood Culture results may not be optimal due to an excessive volume of blood received in  culture bottles Performed at [facility] [address], [place], [place] [address]    Culture   Final    NO GROWTH 5 DAYS Performed at [facility], [place] [address], [place], [place] [address]    Report Status 12/20/2021 FINAL  Final  Blood culture (routine x 2)     Status: None   Collection Time: 12/15/21  6:26 PM   Specimen: BLOOD  Result Value Ref Range Status   Specimen Description   Final    BLOOD BLOOD RIGHT WRIST Performed at [facility] [address], [place], [place] [address]    Special Requests   Final    BOTTLES DRAWN AEROBIC AND ANAEROBIC Blood Culture adequate volume Performed at [facility] [address], [place], [place] [address]    Culture   Final    NO GROWTH 5 DAYS Performed at [facility], [place] [address] [place], [place] [address]    Report Status 12/20/2021 FINAL  Final  Urine Culture     Status: None   Collection Time: 12/15/21  8:52 PM   Specimen: Urine, Clean Catch  Result Value Ref Range Status   Specimen Description   Final    URINE, CLEAN CATCH Performed at [facility], [place] [address], [place], [place] [address]    Special Requests   Final    NONE Performed at [facility], [place] [address], [place],  [address]    Culture   Final  NO GROWTH Performed at [facility], [place] [address] [place], [place] [address]    Report Status 12/16/2021 FINAL  Final  Aerobic Culture w Gram Stain (superficial specimen)     Status: None   Collection Time: 12/16/21  6:47 PM   Specimen: Wound  Result Value Ref Range Status   Specimen Description   Final    WOUND Performed at [facility] [address] [place], [place] [address]    Special Requests   Final    NONE MOUTH Performed at [facility] [address], [place], [place] [address]    Gram Stain   Final    NO WBC SEEN RARE GRAM  POSITIVE COCCI IN CHAINS RARE GRAM POSITIVE COCCI IN PAIRS Performed at [facility], [place] [address], [place], [place] [address]    Culture RARE PSEUDOMONAS AERUGINOSA  Final   Report Status 12/19/2021 FINAL  Final   Organism ID, Bacteria PSEUDOMONAS AERUGINOSA  Final      Susceptibility   Pseudomonas aeruginosa - MIC*    CEFTAZIDIME 4 SENSITIVE Sensitive     CIPROFLOXACIN 0.5 SENSITIVE Sensitive     GENTAMICIN <=1 SENSITIVE Sensitive     IMIPENEM 1 SENSITIVE Sensitive     * RARE PSEUDOMONAS AERUGINOSA    Radiology Studies: No results found.    Rosiland Sen T. [place]  If 7PM-7AM, please contact night-coverage [email or web address] 12/20/2021, 1:48 PM

## 2021-12-20 NOTE — Evaluation (Signed)
Physical Therapy Evaluation Patient Details Name: John A Nesby Sr. MRN: [id number] DOB: [birth year] Today's Date: 12/20/2021  History of Present Illness  66 yo male admitted with abnormal labs. Hx of oral cancer s/p reconstructive surgery, CVA 2018, R BBB, DM  Clinical Impression  On eval, pt was Supv level for mobility. He walked ~135 feet with a RW. Pt reports still experiencing fatigue, dyspnea with activity-required 1 standing rest break. Briefly reviewed a few exercises/stretches he could try for him chronic back pain (standing back extension, standing hip flexion, supine single/double knee to chest) as tolerated-instructed him to stop if any of them increases his pain. Discussed d/c plan-recommend pt return to SNF to continue ST rehab to regain his PLOF/independence-pt in agreement.        Recommendations for follow up therapy are one component of a multi-disciplinary discharge planning process, led by the attending physician.  Recommendations may be updated based on patient status, additional functional criteria and insurance authorization.  Follow Up Recommendations Skilled nursing-short term rehab (<3 hours/day) (return to SNF) Can patient physically be transported by private vehicle: Yes    Assistance Recommended at Discharge Intermittent Supervision/Assistance  Patient can return home with the following  Assist for transportation;Assistance with cooking/housework    Equipment Recommendations None recommended by PT  Recommendations for Other Services       Functional Status Assessment Patient has had a recent decline in their functional status and demonstrates the ability to make significant improvements in function in a reasonable and predictable amount of time.     Precautions / Restrictions Precautions Precautions: Fall Restrictions Weight Bearing Restrictions: No      Mobility  Bed Mobility               General bed mobility comments: oob in recliner     Transfers Overall transfer level: Modified independent Equipment used: Rolling walker (2 wheels) Transfers: Sit to/from Stand Sit to Stand: Modified independent (Device/Increase time)                Ambulation/Gait Ambulation/Gait assistance: Supervision Gait Distance (Feet): 135 Feet Assistive device: Rolling walker (2 wheels) Gait Pattern/deviations: Step-through pattern, Decreased stride length       General Gait Details: 1 standing rest break 2* dyspnea, fatigue. Still unsteady at times.  Stairs            Wheelchair Mobility    Modified Rankin (Stroke Patients Only)       Balance Overall balance assessment: Needs assistance         Standing balance support: Reliant on assistive device for balance, Bilateral upper extremity supported, During functional activity Standing balance-Leahy Scale: Fair                               Pertinent Vitals/Pain Pain Assessment Pain Assessment: Faces Faces Pain Scale: Hurts little more Pain Location: back, L LE Pain Descriptors / Indicators: Discomfort Pain Intervention(s): Monitored during session    Home Living Family/patient expects to be discharged to:: Skilled nursing facility                        Prior Function               Mobility Comments: working with therapies at [facility] per pt/family. has been at SNF since post op reconstructive sg       Hand Dominance        Extremity/Trunk  Assessment   Upper Extremity Assessment Upper Extremity Assessment: Overall WFL for tasks assessed    Lower Extremity Assessment Lower Extremity Assessment: Generalized weakness    Cervical / Trunk Assessment Cervical / Trunk Assessment: Normal  Communication   Communication: Expressive difficulties (some speech deficits 2* oral cancer, s/p surgery)  Cognition Arousal/Alertness: Awake/alert Behavior During Therapy: WFL for tasks assessed/performed Overall Cognitive Status: Within  Functional Limits for tasks assessed                                          General Comments      Exercises     Assessment/Plan    PT Assessment Patient needs continued PT services  PT Problem List Decreased balance;Decreased mobility;Decreased knowledge of use of DME;Decreased activity tolerance       PT Treatment Interventions DME instruction;Gait training;Functional mobility training;Therapeutic activities;Patient/family education;Balance training;Therapeutic exercise    PT Goals (Current goals can be found in the Care Plan section)  Acute Rehab PT Goals Patient Stated Goal: to return to SNF to complete rehab PT Goal Formulation: With patient Time For Goal Achievement: 01/03/22 Potential to Achieve Goals: Good    Frequency Min 3X/week     Co-evaluation               AM-PAC PT "6 Clicks" Mobility  Outcome Measure Help needed turning from your back to your side while in a flat bed without using bedrails?: None Help needed moving from lying on your back to sitting on the side of a flat bed without using bedrails?: None Help needed moving to and from a bed to a chair (including a wheelchair)?: A Little Help needed standing up from a chair using your arms (e.g., wheelchair or bedside chair)?: A Little Help needed to walk in hospital room?: A Little Help needed climbing 3-5 steps with a railing? : A Little 6 Click Score: 20    End of Session Equipment Utilized During Treatment: Gait belt Activity Tolerance: Patient tolerated treatment well Patient left: in chair;with call bell/phone within reach   PT Visit Diagnosis: Unsteadiness on feet (R26.81);Muscle weakness (generalized) (M62.81);History of falling (Z91.81);Difficulty in walking, not elsewhere classified (R26.2)    Time: 4193-7902 PT Time Calculation (min) (ACUTE ONLY): 17 min   Charges:   PT Evaluation $PT Eval Low Complexity: [place], PT Acute  Rehabilitation  Office: [phone] Pager: [phone]

## 2021-12-20 NOTE — Consult Note (Addendum)
[facility]                                                                                        Patient Identification: Patient Name: John FICHERA Sr. MRN: [id number] Admit Date: 12/15/2021  6:01 PM Today's Date: 12/20/2021 Reason for consult: positive wound cx for abtx recs Requesting provider: Wendee Beavers  Principal Problem:   Leukocytosis Active Problems:   DM (diabetes mellitus), type 2 with renal complications ([place])   Chronic kidney disease, stage IV (severe) (HCC)   Hypokalemia   History of stroke   PAD (peripheral artery disease) (HCC)   Cancer of mandible (HCC)   Essential hypertension   Dyslipidemia   GERD without esophagitis   Oropharyngeal dysphagia   Anemia of chronic disease   Eosinophilia   Bandemia   Malnutrition of moderate degree   Antibiotics:  Zosyn 7/5, Cefepime 7/5-c Vancomycin 7/6-7/8  Lines/Hardware: rt arm midline, LLQ gastrostomy  Assessment # Left submandibular SSTI without evidence of deep infection/abscess/bone infection   # Leukocytosis - down trending  # Type 2 DM - a1c 7.7   # 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 Conley Canal and Dr. Hendricks Limes  # Dysphagia s/p PEG  # Hepatitis C - reportedly completed tx in the past  Recommendations  Continue IV cefepime/ Metronidazole FT, plan for 2 weeks course total. Midline +. EOT 12/29/21 Will avoid Fluoroquinolones given patient is on PEG tube and may have difficulty with holding tube feeds while on fluoroquinolons. Fu HCV RNA to see if tx needed  Fu ENT recs  ID available as needed. Please call with questions.   Rest of the management as per the primary team. Please call with questions or concerns.  Thank you for the consult  Rosiland Oz,  MD Infectious Disease Physician [facility] [address] 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 Conley Canal and Dr. Hendricks Limes who presented to the ED on 7/5 with leukocytosis. .  His physical therapist noticed some swelling on the inside of his mouth.  Denied prior fevers, chills, sweats. Denies any N/V/D.  Lab work at the facility showed leukocytosis up to 19,000.  Recently discharged from hospital for pneumonia on 6/29.  Treated with IV Unasyn >> Augmentin on discharge.   At ED, afebrile.  WBC 22.1 Labs remarkable for potassium 3.1, creatinine 3.16, lactic acid 0.7 Blood culture 7/5 2 x 2 sets no growth Wound culture 7/5 from mouth rare Pseudomonas aeruginosa 7/5 CT soft tissue neck reviewed, conservative management per ENT  ROS: all systems reviewed with pertinent positives and negatives as listed above  Past Medical History:  Diagnosis Date  Anemia    Asthma    Chronic kidney disease    Chronic lower back pain 1995   Lumbar back surgery   Diabetic retinopathy ([place])    PDR OU   DM (diabetes mellitus) type II controlled with renal manifestation ([place])    CKD-4, peripheral neuropathy, PAD   Erectile dysfunction due to diabetes mellitus ([place])    Hepatitis C    Hyperlipidemia associated with type 2 diabetes mellitus ([place])    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  Normal EF 60 to 65%.?  GR 1 DD.  Mild LA dilation.;  CKD IV  - Cr 3.2   Hypertensive retinopathy    OU   RBBB (right bundle branch block)    Resistant hypertension    Managed by Dr. 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[facility])    Stroke ([place]) 10/2016   Has residual ataxia and partial left-sided hemiparesis   Past Surgical History:  Procedure Laterality Date   BACK SURGERY  1995   CATARACT EXTRACTION Bilateral    EXCISION MASS NECK N/A 08/06/2021   Procedure: EXCISION OF MANDIBULAR MASS;  Surgeon: Izora Gala, MD;  Location: [facility];  Service: ENT;  Laterality: N/A;   EYE SURGERY Bilateral    Cat Sx   PARS PLANA VITRECTOMY Left 01/25/2017   TOE AMPUTATION Right 2016   Right second toe; dry gangrene   TRANSTHORACIC ECHOCARDIOGRAM  10/2016   TTE May 2018: Normal LV size and severe LVH-without LVOT gradient/S.A.M.  Normal EF 60 to 65%.?  GR 1 DD.  Mild LA dilation.    Scheduled Meds:  atorvastatin  40 mg Per Tube Daily   carvedilol  25 mg Per Tube Q12H   chlorhexidine  15 mL Mouth/Throat TID   enoxaparin (LOVENOX) injection  30 mg Subcutaneous Q24H   feeding supplement (NEPRO CARB STEADY)  237 mL Per Tube 5 X Daily   feeding supplement (PROSource TF)  45 mL Per Tube BID   ferrous sulfate  300 mg Per Tube Daily   free water  200 mL Per Tube 6 X Daily   gabapentin  300 mg Per Tube QHS   hydrALAZINE  100 mg Per Tube TID   insulin aspart  0-15 Units Subcutaneous TID WC   insulin aspart  0-5 Units Subcutaneous QHS   insulin glargine-yfgn  8 Units Subcutaneous Daily   isosorbide dinitrate  20 mg Per Tube TID   lactobacillus  1 g Per Tube Daily   pantoprazole sodium  40 mg Per Tube Daily   terazosin  2 mg Per Tube Daily   Continuous Infusions:  ceFEPime (MAXIPIME) IV 2 g (12/20/21 2947)   metronidazole 500 mg (12/19/21 2155)   vancomycin 1,250 mg (12/18/21 2237)   PRN Meds:.acetaminophen (TYLENOL)  oral liquid 160 mg/5 mL **OR** acetaminophen, labetalol, melatonin, ondansetron **OR** ondansetron (ZOFRAN) IV, oxyCODONE, polyethylene glycol, senna-docusate, traZODone  Allergies  Allergen Reactions   Amlodipine Besy-Benazepril Hcl Anaphylaxis, Shortness Of Breath and Swelling    Mouth and tongue swelling   Shellfish Allergy Anaphylaxis, Shortness Of Breath and Swelling   Social History   Socioeconomic History   Marital status: Single    Spouse name: Not on file   Number of children: 3   Years of education: Not on file   Highest education level: Bachelor's degree (e.g., BA, AB, BS)  Occupational History   Not on file  Tobacco Use   Smoking status: Former    Packs/day: 0.25    Types: Cigarettes    Quit date: 01/07/2018    Years since quitting: 3.9   Smokeless tobacco: Never  Vaping Use   Vaping Use: Never used  Substance and Sexual Activity   Alcohol use: No    Comment: Few beers every other day hx   Drug use: No   Sexual activity: Not Currently  Other Topics Concern   Not on file  Social History Narrative   Lives a home-currently alone.     College grad.-Former Optician, dispensing at [employer], and also Scientist, clinical (histocompatibility and immunogenetics).   Children - 3 with 4 grandchildren and one great grandson.   Caffeine 2-3 cups coffee / wk former smoker of less than a pack a week.  Quit in 2019      He usually was quite active walking etc.  Now mild able to do anything for the last several months.   Social Determinants of Health   Financial Resource Strain: Not on file  Food Insecurity: Not on file  Transportation Needs: No Transportation Needs (12/06/2021)   PRAPARE - Hydrologist (Medical): No    Lack of Transportation (Non-Medical): No  Physical Activity: Not on file  Stress: Not on file  Social Connections: Not on file  Intimate Partner Violence: Not on file   Family History  Problem Relation Age of Onset   Coronary artery disease Mother         MI in her 66s   Diabetes Mother    Macular degeneration Mother    Hypertension Other    Diabetes Other    Alzheimer's disease Other    Coronary artery disease Brother    Diabetes Brother    Diabetes Sister       Vitals BP (!) 160/74   Pulse 66   Temp (!) 97.4 F (36.3 C) (Oral)   Resp 18   Ht '5\' 7"'$  (1.702 m)   Wt 73 kg   SpO2 100%   BMI 25.21 kg/m    Physical Exam Constitutional:  sitting up in the chair and reading     Comments:  Throat: bulky soft tissue flap centrally and to the left,  minimal pus along the left edge of the flap  Neck: lateral shallow open area, bandaged   Cardiovascular:     Rate and Rhythm: Normal rate and regular rhythm.     Heart sounds:   Pulmonary:     Effort: Pulmonary effort is normal on RA    Comments: Normal breath sounds   Abdominal:     Palpations: Abdomen is soft.     Tenderness: non distended, PEG +  Musculoskeletal:        General: No swelling or tenderness.   Skin:    Comments: as above   Neurological:     General: awake, alert and oriented   Psychiatric:        Mood and Affect: Mood normal.    Pertinent Microbiology Results for orders placed or performed during the hospital encounter of 12/15/21  Blood culture (routine x 2)     Status: None   Collection Time: 12/15/21  6:25 PM   Specimen: BLOOD  Result Value Ref Range Status   Specimen Description   Final    BLOOD BLOOD LEFT WRIST Performed at [facility]  [address] [place], [place] [address]    Special Requests   Final    BOTTLES DRAWN AEROBIC AND ANAEROBIC Blood Culture results may not be optimal due to an excessive volume of blood received in culture bottles Performed at [facility] [address], [place], [place] [address]    Culture   Final    NO GROWTH 5 DAYS Performed at [facility], [place] [address], [place], [place] [address]    Report Status 12/20/2021 FINAL  Final  Blood culture  (routine x 2)     Status: None   Collection Time: 12/15/21  6:26 PM   Specimen: BLOOD  Result Value Ref Range Status   Specimen Description   Final    BLOOD BLOOD RIGHT WRIST Performed at [facility] [address] [place], [place] [address]    Special Requests   Final    BOTTLES DRAWN AEROBIC AND ANAEROBIC Blood Culture adequate volume Performed at [facility] [address], [place], [place] [address]    Culture   Final    NO GROWTH 5 DAYS Performed at [facility], [place] [address], [place], [place] [address]    Report Status 12/20/2021 FINAL  Final  Urine Culture     Status: None   Collection Time: 12/15/21  8:52 PM   Specimen: Urine, Clean Catch  Result Value Ref Range Status   Specimen Description   Final    URINE, CLEAN CATCH Performed at [facility], [place] [address] [place], [place] [address]    Special Requests   Final    NONE Performed at [facility], [place] [address] [place], [place] [address]    Culture   Final    NO GROWTH Performed at [facility], [place] [address] [place], [place] [address]    Report Status 12/16/2021 FINAL  Final  Aerobic Culture w Gram Stain (superficial specimen)     Status: None   Collection Time: 12/16/21  6:47 PM   Specimen: Wound  Result Value Ref Range Status   Specimen Description   Final    WOUND Performed at [facility] [address], [place], [place] [address]    Special Requests   Final    NONE MOUTH Performed at [facility] [address] [place], [place] [address]    Gram Stain   Final    NO WBC SEEN RARE GRAM POSITIVE COCCI IN CHAINS RARE GRAM POSITIVE COCCI IN PAIRS Performed at [facility], [place] [address] [place],  [address]    Culture RARE PSEUDOMONAS AERUGINOSA  Final   Report Status 12/19/2021 FINAL  Final   Organism ID, Bacteria PSEUDOMONAS AERUGINOSA  Final       Susceptibility   Pseudomonas aeruginosa - MIC*    CEFTAZIDIME 4 SENSITIVE Sensitive     CIPROFLOXACIN 0.5 SENSITIVE Sensitive     GENTAMICIN <=1 SENSITIVE Sensitive     IMIPENEM 1 SENSITIVE Sensitive     * RARE PSEUDOMONAS AERUGINOSA   Pertinent Lab seen by me:    Latest Ref Rng & Units 12/20/2021    5:54 AM 12/19/2021   10:36 AM 12/18/2021   10:39 AM  CBC  WBC 4.0 - 10.5 K/uL 12.7  14.6  15.5   Hemoglobin 13.0 - 17.0 g/dL 8.5  9.4  9.3   Hematocrit 39.0 - 52.0 % 27.8  30.9  29.8   Platelets 150 -  400 K/uL 201  204  216       Latest Ref Rng & Units 12/20/2021    5:54 AM 12/19/2021   10:36 AM 12/18/2021   10:39 AM  CMP  Glucose 70 - 99 mg/dL 84  120  169   BUN 8 - 23 mg/dL 64  62  68   Creatinine 0.61 - 1.24 mg/dL 0.61 - 1.24 mg/dL 2.37    2.39  2.42    2.42  2.80    2.76   Sodium 135 - 145 mmol/L 143  141  145   Potassium 3.5 - 5.1 mmol/L 3.8  4.6  4.1   Chloride 98 - 111 mmol/L 114  112  112   CO2 22 - 32 mmol/L '20  21  24   '$ Calcium 8.9 - 10.3 mg/dL 9.0  9.1  9.4      Pertinent Imagings/Other Imagings Plain films and CT images have been personally visualized and interpreted; radiology reports have been reviewed. Decision making incorporated into the Impression / Recommendations.  CT Soft Tissue Neck Wo Contrast  Result Date: 12/15/2021 CLINICAL DATA:  History of oral carcinoma. Elevated white blood cell count. EXAM: CT NECK WITHOUT CONTRAST TECHNIQUE: Multidetector CT imaging of the neck was performed following the standard protocol without intravenous contrast. RADIATION DOSE REDUCTION: This exam was performed according to the departmental dose-optimization program which includes automated exposure control, adjustment of the mA and/or kV according to patient size and/or use of iterative reconstruction technique. COMPARISON:  12/07/2021 FINDINGS: Pharynx and larynx: Unchanged postsurgical appearance the oral cavity and floor mouth. No discrete mass collection. Induration of the  subcutaneous fat in the submandibular region is unchanged. Salivary glands: Submandibular and left parotid glands are absent. Thyroid: Normal Lymph nodes: Status post bilateral neck dissection. No cervical lymphadenopathy. Vascular: Unremarkable unenhanced appearance of the vessels. Limited intracranial: Normal Visualized orbits: Normal Mastoids and visualized paranasal sinuses: Negative Skeleton: Unchanged postsurgical appearance of the mandible Upper chest: Clear Other: Unchanged appearance of nodes in the left supraclavicular fossa. IMPRESSION: 1. Unchanged postsurgical appearance of the oral cavity and floor of mouth without discrete mass or fluid collection. 2. Unchanged appearance of fat stranding in the submandibular region, which may be post treatment change. 3. No cervical lymphadenopathy. Electronically Signed   By: Ulyses Jarred M.D.   On: 12/15/2021 19:29   DG Chest Port 1 View  Result Date: 12/15/2021 CLINICAL DATA:  Cough.  Elevated white blood cell count. EXAM: PORTABLE CHEST 1 VIEW COMPARISON:  Radiograph 1 week ago 12/08/2021 FINDINGS: Improved lung aeration. Decreased bibasilar opacities from prior exam, minor residual right lower lobe atelectasis. No new or progressive airspace disease. Cardiomegaly is stable. No pleural effusion or pneumothorax. Left axillary surgical clips. Possible catheter projecting over the right axilla, only partially included in the field of view. IMPRESSION: 1. Improved lung aeration over the past week with decreased bibasilar opacities. Mild residual right lower lobe atelectasis. No new abnormality. 2. Stable cardiomegaly. 3. Possible catheter projecting over the right axilla versus external artifact. Recommend correlation with physical exam. Electronically Signed   By: Keith Rake M.D.   On: 12/15/2021 18:44   US RENAL  Result Date: 12/08/2021 CLINICAL DATA:  Acute renal failure EXAM: RENAL / URINARY TRACT ULTRASOUND COMPLETE COMPARISON:  None Available.  FINDINGS: Right Kidney: Renal measurements: 10.6 x 5.4 x 7.2 cm = volume: 116.4 mL. No hydronephrosis. Increased renal cortical echogenicity. Left Kidney: Renal measurements: 10.9 x 6.7 x 6.1 cm = volume: 129.8 mL.  No hydronephrosis. Increased renal cortical echogenicity. Bladder: Mild dependent debris in the posterior bladder. Bilateral ureteral jets are seen. The bladder is well distended. Other: None. IMPRESSION: No hydronephrosis. Increased renal echogenicity as can be seen in medical renal disease. Mild dependent debris in the posterior bladder. Correlate with urinalysis. Electronically Signed   By: Maurine Simmering M.D.   On: 12/08/2021 16:35   DG CHEST PORT 1 VIEW  Result Date: 12/08/2021 CLINICAL DATA:  Cough with history of mandibular cancer. EXAM: PORTABLE CHEST 1 VIEW COMPARISON:  Portable chest 02/11/2018. FINDINGS: The patient's neck soft tissues minimally obscure the medial apices. There is mild-to-moderate cardiomegaly. The lungs expiratory limiting the study. Mediastinum appears stable with calcifications in the aortic arch. There is prominence of the central vessels which could be due to expiration or mild perihilar vascular congestion. No pleural effusion is seen. There is increased opacity in the hypoinflated bases left-greater-than-right which could be atelectasis, pneumonia or aspiration. No acute osseous abnormality is seen. No pneumothorax. Mild thoracic dextroscoliosis. IMPRESSION: 1. Limited expiratory study. 2. Left-greater-than-right basilar opacities which could be atelectasis or pneumonia. 3. Central vascular prominence which could be due to technique or mild perihilar vascular congestion. No overt edema. 4. Follow-up PA and lateral views recommended in full inspiration. Electronically Signed   By: Telford Nab M.D.   On: 12/08/2021 02:34   CT Soft Tissue Neck Wo Contrast  Result Date: 12/08/2021 CLINICAL DATA:  Initial evaluation for soft tissue swelling, infection suspected. EXAM:  CT NECK WITHOUT CONTRAST TECHNIQUE: Multidetector CT imaging of the neck was performed following the standard protocol without intravenous contrast. RADIATION DOSE REDUCTION: This exam was performed according to the departmental dose-optimization program which includes automated exposure control, adjustment of the mA and/or kV according to patient size and/or use of iterative reconstruction technique. COMPARISON:  Prior exams from 11/24/2021 as well as earlier studies. FINDINGS: Pharynx and larynx: Postoperative changes from prior left hemimandibulectomy with flap/fat graft placement, and bilateral nodal dissection. Diffuse soft tissue density seen about the reconstructed mandible as well as the adjacent left submandibular space and submental region. Overlying skin thickening with stranding in the subcutaneous fat, which could reflect post treatment changes. No visible collections evident on this noncontrast examination. No appreciable or residual tumor or mass. Oropharynx and nasopharynx within normal limits. Trace retropharyngeal effusion without loculated collection. Negative epiglottis. Small amount of layering secretions noted within the hypopharynx. Remainder of the supraglottic larynx and glottis are within normal limits. Subglottic airway patent clear. Salivary glands: Right parotid gland within normal limits. Right submandibular gland likely remains and is grossly unremarkable. The left parotid and submandibular glands appear to have been resected. Thyroid: No significant finding. Lymph nodes: Sequelae of prior bilateral nodal dissection seen within the neck. No visible enlarged or pathologic adenopathy. Mildly prominent nodes at the left supraclavicular fossa measure up to 1 cm in short axis, similar to previous, and indeterminate. Vascular: Scattered atheromatous change about the carotid bifurcations and visualized carotid siphons. Limited intracranial: Unremarkable. Visualized orbits: Prior bilateral  ocular lens replacement. Otherwise unremarkable. Mastoids and visualized paranasal sinuses: Mild scattered mucoperiosteal thickening present about the ethmoidal air cells and maxillary sinuses. Visualized paranasal sinuses are otherwise clear. Trace right mastoid effusion noted. Left mastoid air cells are clear. Skeleton: No worrisome osseous lesions. Subcentimeter sclerotic lesion within the T3 vertebral body noted, stable from prior, likely a small benign bone island. Moderate spondylosis present at C3-4 through C6-7. Upper chest: Visualized upper chest demonstrates no acute finding. Partially visualized lungs  are largely clear. Sequelae of prior left axillary nodal dissection noted as well. Other: None. IMPRESSION: 1. Postoperative changes from prior left hemimandibulectomy with flap/fat graft placement, and bilateral nodal dissection. 2. Diffuse soft tissue density about the reconstructed mandible as well as the adjacent left submandibular space and submental region with overlying skin thickening and stranding. Findings felt to be most consistent with post treatment changes. No visible residual or recurrent tumor. No discrete or loculated collections on this noncontrast examination. 3. No enlarged or pathologic adenopathy within the neck. Mildly prominent nodes at the left supraclavicular fossa measure up to 1 cm in short axis, similar to previous, and indeterminate. Attention at follow-up recommended. 4. No other acute abnormality. Electronically Signed   By: Jeannine Boga M.D.   On: 12/08/2021 01:30      I spent more than 80 minutes for this patient encounter including review of prior medical records/discussing diagnostics and treatment plan with the patient/family/coordinate care with primary/other specialits with greater than 50% of time in face to face encounter.   Electronically signed by:   Rosiland Oz, MD Infectious Disease Physician [facility] for Infectious  Disease Pager: [phone]

## 2021-12-20 NOTE — Evaluation (Addendum)
Clinical/Bedside Swallow Evaluation Patient Details  Name: John A Motton Sr. MRN: [id number] Date of Birth: [birth year]  Today's Date: 12/20/2021 Time: SLP Start Time (ACUTE ONLY): 1120 SLP Stop Time (ACUTE ONLY): 1220 SLP Time Calculation (min) (ACUTE ONLY): 60 min  Past Medical History:  Past Medical History:  Diagnosis Date   Anemia    Asthma    Chronic kidney disease    Chronic lower back pain 1995   Lumbar back surgery   Diabetic retinopathy ([place])    PDR OU   DM (diabetes mellitus) type II controlled with renal manifestation ([place])    CKD-4, peripheral neuropathy, PAD   Erectile dysfunction due to diabetes mellitus ([place])    Hepatitis C    Hyperlipidemia associated with type 2 diabetes mellitus ([place])    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  Normal EF 60 to 65%.?  GR 1 DD.  Mild LA dilation.;  CKD IV  - Cr 3.2   Hypertensive retinopathy    OU   RBBB (right bundle branch block)    Resistant hypertension    Managed by Dr. 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[facility])    Stroke ([place]) 10/2016   Has residual ataxia and partial left-sided hemiparesis   Past Surgical History:  Past Surgical History:  Procedure Laterality Date   BACK SURGERY  1995   CATARACT EXTRACTION Bilateral    EXCISION MASS NECK N/A 08/06/2021   Procedure: EXCISION OF MANDIBULAR MASS;  Surgeon: Izora Gala, MD;  Location: [place];  Service: ENT;  Laterality: N/A;   EYE SURGERY Bilateral    Cat Sx   PARS PLANA VITRECTOMY Left 01/25/2017   TOE AMPUTATION Right 2016   Right second toe; dry gangrene   TRANSTHORACIC ECHOCARDIOGRAM  10/2016   TTE May 2018: Normal LV size and severe LVH-without LVOT gradient/S.A.M.  Normal EF 60 to 65%.?  GR 1 DD.  Mild LA dilation.   HPI:  67yo male admitted from SNF 12/15/21 with acute onset of worsening  leukocytosis. 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 Recent hospitalization for bilateral Aspiration PNA (DC 12/09/21)    Assessment / Plan / Recommendation  Clinical Impression  Pt seen at bedside to assess swallow function and safety. Pt is ~11 weeks s/p mandibular free flap construction (April 2023). Pt has missing dentition, numbness on the left lower face and lower lip. Flap is currently swollen appearing. Pt exhibits reduced ROM and strength of the lips and tongue. However, his speech is remarkably intelligible. Pt reports he completes oral care several times a day. Suction is set up to facilitate oral care and removal of oral residue. Pt has been taking ice chips/thin liquids via teaspoon at SNF.   Today, pt was given ice chips/water and pureed textures. Posterior propulsion is poor, with pt exhibiting a "sucking" behavior to move the bolus to be swallowed. It is for this reason that pt exhibits multiple swallows per bolus. No cough or voice quality change noted following trials of thin liquid or puree. Pt reports he "passed" his FEES 2-3 weeks ago, and that no penetration or aspiration was seen on the study. Pt's daughter confirmed this information. SLP called Rober Minion, however,  pt's primary SLP was not available, and another therapy staff member was unable to provide information.   At this time, recommend providing floor stock pureed items (ice cream, applesauce, pudding) for oral pleasure and comfort. Pt is aware that PO intake at this time is NOT meant to meet nutrition/hydration needs. PEG tube will continue to be primary for nutrition, hydration, and medication.   SLP will follow to assess tolerance of PO trials and determine if further work up is needed.  SLP Visit Diagnosis:  Dysphagia, unspecified (R13.10)    Aspiration Risk  Moderate aspiration risk;Mild aspiration risk    Diet Recommendation Other (Comment) (thin liquids via teaspoon, floor stock puree via small bolus)   Liquid Administration via: Spoon Medication Administration: Via alternative means Supervision: Patient able to self feed;Intermittent supervision to cue for compensatory strategies Compensations: Slow rate;Small sips/bites Postural Changes: Seated upright at 90 degrees;Remain upright for at least 30 minutes after po intake    Other  Recommendations Oral Care Recommendations: Oral care before and after PO Other Recommendations: Have oral suction available    Recommendations for follow up therapy are one component of a multi-disciplinary discharge planning process, led by the attending physician.  Recommendations may be updated based on patient status, additional functional criteria and insurance authorization.  Follow up Recommendations Skilled nursing-short term rehab (<3 hours/day)      Assistance Recommended at Discharge Intermittent Supervision/Assistance  Functional Status Assessment Patient has had a recent decline in their functional status and demonstrates the ability to make significant improvements in function in a reasonable and predictable amount of time.  Frequency and Duration min 1 x/week  1 week;2 weeks       Prognosis Prognosis for Safe Diet Advancement: Fair      Swallow Study   General Date of Onset: 12/15/21 HPI: 66yo male admitted from [facility] 12/15/21 with acute onset of worsening leukocytosis. 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 Recent hospitalization for bilateral Aspiration PNA (DC 12/09/21) Type of Study: Bedside Swallow Evaluation Previous Swallow  Assessment: pt reports FEES done 2-3 weeks ago at [facility]. Diet Prior to this Study: PEG tube Temperature Spikes Noted: No Respiratory Status: Room air History of Recent Intubation: No Behavior/Cognition: Alert;Cooperative;Pleasant mood Oral Cavity Assessment: Other (comment) (post-surgical swelling) Oral Care Completed by SLP: Yes Oral Cavity - Dentition: Missing dentition Vision: Functional for self-feeding Self-Feeding Abilities: Able to feed self Patient Positioning: Upright in chair Baseline Vocal Quality: Normal Volitional Cough: Strong Volitional Swallow: Able to elicit    Oral/Motor/Sensory Function Overall Oral Motor/Sensory Function: Moderate impairment Facial ROM: Reduced left Facial Symmetry: Abnormal symmetry left Facial Strength: Reduced left Facial Sensation: Reduced left Lingual ROM: Reduced right;Reduced left Lingual Symmetry: Abnormal symmetry left Lingual Strength: Reduced Lingual Sensation: Within Functional Limits Mandible: Within Functional Limits   Ice Chips Ice chips: Impaired Presentation: Spoon;Self Fed Oral Phase Impairments: Reduced labial seal;Reduced lingual movement/coordination;Impaired mastication Oral Phase Functional Implications: Left anterior spillage;Prolonged oral transit;Oral residue   Thin Liquid Thin Liquid: Impaired Presentation: Spoon;Self Fed Oral Phase Impairments: Reduced lingual movement/coordination;Reduced labial seal;Impaired mastication Oral Phase Functional Implications: Prolonged oral transit;Left anterior spillage    Nectar Thick Nectar Thick Liquid: Not tested   Honey Thick Honey Thick Liquid: Not tested   Puree Puree: Impaired Presentation: Spoon;Self Fed Oral Phase Impairments: Impaired mastication;Reduced lingual movement/coordination;Reduced labial seal  Oral Phase Functional Implications: Left lateral sulci pocketing;Prolonged oral transit;Oral residue   Solid     Solid: Not tested     Jamario Colina B. Quentin Ore,  [facility] [place] Speech Language Pathologist Office: [phone]  Shonna Chock 12/20/2021,12:42 PM

## 2021-12-20 NOTE — TOC Progression Note (Signed)
Transition of Care (TOC) - Progression Note    Patient Details  Name: Zakariye A Buswell Sr. MRN: [id number] Date of Birth: 1955/07/07  Transition [facility]) CM/SW Contact  Lennart Pall, LCSW Phone Number: 12/20/2021, 1:34 PM  Clinical Narrative:     Have spoken with pt and after discussion with pt's daughter and Lexine Baton at [employer]- correction that pt WAS being seen by all therapies at SNF so have asked PT to see him here.  Have, also, spoken with  contact at [facility] program,  Emmie Niemann @ [phone],  and she says they can assist with transportation back to SNF if discharge before 5pm.  TOC continues to follow.        Expected Discharge Plan and Services                                                 Social Determinants of Health (SDOH) Interventions    Readmission Risk Interventions    12/17/2021    2:36 PM  Readmission Risk Prevention Plan  Transportation Screening Complete  Medication Review ([place]) Complete  PCP or Specialist appointment within 3-5 days of discharge Complete  HRI or Home Care Consult Complete  SW Recovery Care/Counseling Consult Complete  [place] Complete

## 2021-12-20 NOTE — Progress Notes (Addendum)
Subjective: Doing well, stable.  Objective: Vital signs in last 24 hours: Temp:  [97.4 F (36.3 C)-98 F (36.7 C)] 97.4 F (36.3 C) (07/10 0519) Pulse Rate:  [65-67] 66 (07/10 0519) Resp:  [18-20] 18 (07/10 0519) BP: (149-160)/(68-74) 160/74 (07/10 0519) SpO2:  [100 %] 100 % (07/10 0519) Wt Readings from Last 1 Encounters:  12/15/21 73 kg    Intake/Output from previous day: 07/09 0701 - 07/10 0700 In: 3227 [P.O.:10; NG/GT:711; IV Piggyback:750] Out: 1150 [Urine:1150] Intake/Output this shift: Total I/O In: -  Out: 300 [Urine:300]  General appearance: alert, cooperative, and no distress Throat: flap in mouth healthy-appearing, granulated area to left of flap, no pus expressible  Recent Labs    12/19/21 1036 12/20/21 0554  WBC 14.6* 12.7*  HGB 9.4* 8.5*  HCT 30.9* 27.8*  PLT 204 201    Recent Labs    12/19/21 1036 12/20/21 0554  NA 141 143  K 4.6 3.8  CL 112* 114*  CO2 21* 20*  GLUCOSE 120* 84  BUN 62* 64*  CREATININE 2.42*  2.42* 2.39*  2.37*  CALCIUM 9.1 9.0    Medications: I have reviewed the patient's current medications.  Assessment/Plan: Oral cancer s/p free flap reconstruction, flap-related infection  WBC continues to decrease, no pus expressible today.  Culture grew Pseudomonas.  Can convert to enteral antibiotic delivery when able.  May be able to be discharged back to facility soon.  He should follow-up with Dr[facility] Hendricks Limes at [facility] as outpatient.  Call me with questions.   LOS: 5 days   Melida Quitter 12/20/2021, 12:20 PM

## 2021-12-21 ENCOUNTER — Inpatient Hospital Stay: Payer: Non-veteran care | Admitting: Dietician

## 2021-12-21 ENCOUNTER — Other Ambulatory Visit: Payer: Self-pay

## 2021-12-21 ENCOUNTER — Ambulatory Visit
Admission: RE | Admit: 2021-12-21 | Discharge: 2021-12-21 | Disposition: A | Discharge status: Home / Self Care | Payer: No Typology Code available for payment source | Source: Ambulatory Visit | Attending: Radiation Oncology | Admitting: Radiation Oncology

## 2021-12-21 DIAGNOSIS — A498 Other bacterial infections of unspecified site: Secondary | ICD-10-CM

## 2021-12-21 DIAGNOSIS — L089 Local infection of the skin and subcutaneous tissue, unspecified: Secondary | ICD-10-CM

## 2021-12-21 DIAGNOSIS — N1832 Chronic kidney disease, stage 3b: Secondary | ICD-10-CM

## 2021-12-21 DIAGNOSIS — D638 Anemia in other chronic diseases classified elsewhere: Secondary | ICD-10-CM | POA: Diagnosis not present

## 2021-12-21 DIAGNOSIS — N179 Acute kidney failure, unspecified: Secondary | ICD-10-CM | POA: Diagnosis not present

## 2021-12-21 DIAGNOSIS — D72829 Elevated white blood cell count, unspecified: Secondary | ICD-10-CM | POA: Diagnosis not present

## 2021-12-21 DIAGNOSIS — E440 Moderate protein-calorie malnutrition: Secondary | ICD-10-CM

## 2021-12-21 DIAGNOSIS — D72825 Bandemia: Secondary | ICD-10-CM | POA: Diagnosis not present

## 2021-12-21 LAB — RAD ONC ARIA SESSION SUMMARY
Course Elapsed Days: 6
Plan Fractions Treated to Date: 4
Plan Prescribed Dose Per Fraction: 2 Gy
Plan Total Fractions Prescribed: 30
Plan Total Prescribed Dose: 60 Gy
Reference Point Dosage Given to Date: 8 Gy
Reference Point Session Dosage Given: 2 Gy
Session Number: 4

## 2021-12-21 LAB — FOLATE: Folate: 16.8 ng/mL (ref >5.9)

## 2021-12-21 LAB — RETICULOCYTES
Immature Retic Fract: 16.3 % — ABNORMAL HIGH (ref 2.3–15.9)
RBC.: 2.33 MIL/uL — ABNORMAL LOW (ref 4.22–5.81)
Retic Count, Absolute: 43.3 10*3/uL (ref 19.0–186.0)
Retic Ct Pct: 1.9 % (ref 0.4–3.1)

## 2021-12-21 LAB — CBC
HCT: 24.4 % — ABNORMAL LOW (ref 39.0–52.0)
HCT: 26.5 % — ABNORMAL LOW (ref 39.0–52.0)
Hemoglobin: 7.6 g/dL — ABNORMAL LOW (ref 13.0–17.0)
Hemoglobin: 8.4 g/dL — ABNORMAL LOW (ref 13.0–17.0)
MCH: 32.9 pg (ref 26.0–34.0)
MCH: 33.2 pg (ref 26.0–34.0)
MCHC: 31.1 g/dL (ref 30.0–36.0)
MCHC: 31.7 g/dL (ref 30.0–36.0)
MCV: 105.6 fL — ABNORMAL HIGH (ref 80.0–100.0)
MCV: 104.7 fL — ABNORMAL HIGH (ref 80.0–100.0)
Platelets: 167 10*3/uL (ref 150–400)
Platelets: 200 10*3/uL (ref 150–400)
RBC: 2.31 MIL/uL — ABNORMAL LOW (ref 4.22–5.81)
RBC: 2.53 MIL/uL — ABNORMAL LOW (ref 4.22–5.81)
RDW: 14.6 % (ref 11.5–15.5)
RDW: 14.6 % (ref 11.5–15.5)
WBC: 12.3 10*3/uL — ABNORMAL HIGH (ref 4.0–10.5)
WBC: 14.1 10*3/uL — ABNORMAL HIGH (ref 4.0–10.5)
nRBC: 0 % (ref 0.0–0.2)
nRBC: 0 % (ref 0.0–0.2)

## 2021-12-21 LAB — HCV RNA QUANT: HCV Quantitative: NOT DETECTED IU/mL (ref >50)

## 2021-12-21 LAB — RENAL FUNCTION PANEL
Albumin: 2.7 g/dL — ABNORMAL LOW (ref 3.5–5.0)
Anion gap: 5 (ref 5–15)
BUN: 66 mg/dL — ABNORMAL HIGH (ref 8–23)
CO2: 22 mmol/L (ref 22–32)
Calcium: 9 mg/dL (ref 8.9–10.3)
Chloride: 114 mmol/L — ABNORMAL HIGH (ref 98–111)
Creatinine, Ser: 2.6 mg/dL — ABNORMAL HIGH (ref 0.61–1.24)
GFR, Estimated: 26 mL/min — ABNORMAL LOW (ref >60)
Glucose, Bld: 102 mg/dL — ABNORMAL HIGH (ref 70–99)
Phosphorus: 2.6 mg/dL (ref 2.5–4.6)
Potassium: 3.7 mmol/L (ref 3.5–5.1)
Sodium: 141 mmol/L (ref 135–145)

## 2021-12-21 LAB — GLUCOSE, CAPILLARY
Glucose-Capillary: 159 mg/dL — ABNORMAL HIGH (ref 70–99)
Glucose-Capillary: 145 mg/dL — ABNORMAL HIGH (ref 70–99)

## 2021-12-21 LAB — IRON AND TIBC
Iron: 49 ug/dL (ref 45–182)
Saturation Ratios: 23 % (ref 17.9–39.5)
TIBC: 211 ug/dL — ABNORMAL LOW (ref 250–450)
UIBC: 162 ug/dL

## 2021-12-21 LAB — MAGNESIUM: Magnesium: 2.1 mg/dL (ref 1.7–2.4)

## 2021-12-21 LAB — VITAMIN B12: Vitamin B-12: 444 pg/mL (ref 180–914)

## 2021-12-21 LAB — FERRITIN: Ferritin: 138 ng/mL (ref 24–336)

## 2021-12-21 LAB — PATHOLOGIST SMEAR REVIEW

## 2021-12-21 MED ORDER — ISOSORBIDE DINITRATE 20 MG PO TABS
30.0000 mg | ORAL_TABLET | Freq: Three times a day (TID) | ORAL | Status: DC
Start: 2021-12-21 — End: 2022-05-11

## 2021-12-21 MED ORDER — SODIUM CHLORIDE 0.9 % IV SOLN: 2.0000 g | INTRAVENOUS | Status: AC

## 2021-12-21 MED ORDER — METRONIDAZOLE 500 MG/100ML IV SOLN: 500.0000 mg | Freq: Two times a day (BID) | INTRAVENOUS | 0 refills | Status: AC

## 2021-12-21 MED ORDER — POLYETHYLENE GLYCOL 3350 17 G PO PACK: 17.0000 g | PACK | Freq: Every day | ORAL | Status: AC | PRN

## 2021-12-21 NOTE — TOC Transition Note (Signed)
Transition of Care [facility]) - CM/SW Discharge Note   Patient Details  Name: John A Ruffalo Sr. MRN: [id number] Date of Birth: 08-Feb-1956  Transition [facility] Care [facility]) CM/SW Contact:  Cylis Ayars, Marjie Skiff, RN Phone Number: 12/21/2021, 12:31 PM   Clinical Narrative:    Pt to dc back to [facility] today. He has asked to go via [facility] transport. Per Levada Dy from [facility], they will pick him up at [place] summary sent to [facility] and faxed to [facility]. RN to call report to [phone].  Readmission Risk Interventions    12/17/2021    2:36 PM  Readmission Risk Prevention Plan  Transportation Screening Complete  Medication Review (RN Care Manager) Complete  PCP or Specialist appointment within 3-5 days of discharge Complete  HRI or Home Care Consult Complete  SW Recovery Care/Counseling Consult Complete  [place] Complete

## 2021-12-21 NOTE — Discharge Summary (Signed)
Physician Discharge Summary  John Jacobson ZOX:[id number] DOB: [birth year] DOA: 12/15/2021  PCP: No primary care provider on file.  Admit date: 12/15/2021 Discharge date: 12/21/2021 Admitted From: SNF Disposition: SNF Recommendations for Outpatient Follow-up:  Follow up with PCP and oral surgery in 1 week Please obtain CBC with differential and BMP in a week. Please follow up on the following pending results: None   Discharge Condition: Stable CODE STATUS: Full code   Hospital course 66 year old M with PMH of stage IVA mandibular SCC, DM-2, CKD-3B, CVA, HTN, dysphagia with tube feed dependent and recent hospitalization from 6/27-6/29 for bilateral aspiration pneumonia and AKI when he left AMA to go to his outpatient appointments returning from SNF for leukocytosis and left oral swelling.  Patient was on IV ceftriaxone via PICC line.    In ED, slightly hypertensive.  K3.1. Cr 3.16 (4.0 on 6/29).  BUN 87.  WBC 24.3 with eosinophilia and some left shift.  CXR with improved lung aeration, stable cardiomegaly and PICC line projecting over the right axilla.  Cultures obtained.  Started on IV vancomycin and Zosyn and admitted for leukocytosis.  ENT consulted.   Blood cultures NGTD.  Patient was evaluated by ENT.  Superficial wound culture sent and gram stain with GPC in chains and pairs.  Culture with pansensitive Pseudomonas aeruginosa.  ID consulted.  Antibiotic de-escalated to IV cefepime and Flagyl through 12/30/2021.  In regards to dysphagia, evaluated by SLP and advanced to full liquid diet.  Recommend follow-up with his oral surgeons at [facility] in 1 week.  See individual problem list below for more.   Problems addressed during this hospitalization Left submandibular tissue infection without evidence of deep infection/abscess/bone infection Leukocytosis with eosinophilia and bandemia: Likely due to the above-improved. SCC of oral cavity s/p complex surgical resection and  reconstruction by Dr. Conley Canal and Dr. Hendricks Limes at [facility] -Blood cultures NGTD.  Superficial culture with pansensitive P.  aeruginosa.  GS with GPC in pairs and chains -Continue cefepime, vancomycin and Flagyl>> IV cefepime and Flagyl through 12/30/2021. -Check CBC with differential and BMP in 1 week or sooner if needed -Appreciate input by ENT-outpatient follow-up with his oral surgeon at the [facility] -Radiation treatment per radiation oncology   AKI/azotemia superimposed on CKD-3B: Improved. Recent Labs    12/08/21 0351 12/08/21 1416 12/09/21 0507 12/15/21 1826 12/16/21 0548 12/17/21 0547 12/18/21 1039 12/19/21 1036 12/20/21 0554 12/21/21 0610  BUN 122* 112* 113* 87* 80* 69* 68* 62* 64* 66*  CREATININE 4.08* 4.28* 4.00* 3.16* 2.91* 2.75* 2.76*  2.80* 2.42*  2.42* 2.39*  2.37* 2.60*  -Avoid nephrotoxic meds -Recheck in 1 week    Controlled IDDM-2 with CKD-4, PAD and other complications: W1X 9.1%. Recent Labs  Lab 12/20/21 0723 12/20/21 1149 12/20/21 1702 12/20/21 2047 12/21/21 0808  GLUCAP 163* 156* 133* 113* 159*  -Continue home medications    Anemia of chronic disease: No melena, hematochezia or signs of bleeding anywhere.  Anemia panel basically normal.  Slight drop in Hgb partly dilutional from IV fluid.  Recent Labs    12/07/21 2241 12/09/21 0507 12/15/21 1826 12/16/21 0548 12/17/21 0546 12/18/21 1039 12/19/21 1036 12/20/21 0554 12/21/21 0610 12/21/21 1121  HGB 9.8* 8.9* 9.6* 8.9* 9.1* 9.3* 9.4* 8.5* 7.6* 8.4*  -Recheck CBC with differential in 1 week  History of hepatitis C infection: Reportedly completed treatment in the past. -Follow hepatitis viral RNA test   Hypokalemia: Resolved   GERD without esophagitis -Continue PPI therapy.   Dyslipidemia -Continue statin  Essential hypertension: BP elevated but improved. -Continue Coreg 25 mg twice daily -Increased Isordil to 30 mg 3 times daily -Continue hydralazine 100 mg 3 times daily   History of  CVA Physical deconditioning -Continue PT/OT/SLP   Oropharyngeal dysphagia-upgraded to full liquid diet by SLP. -Continue speech therapy Moderate malnutrition Nutrition Problem: Moderate Malnutrition Etiology: chronic illness, cancer and cancer related treatments Signs/Symptoms: moderate fat depletion, moderate muscle depletion, severe muscle depletion Interventions: Tube feeding, Prostat     Vital signs Vitals:   12/20/21 0519 12/20/21 1351 12/20/21 1957 12/21/21 0434  BP: (!) 160/74 (!) 152/76 (!) 150/69 140/63  Pulse: 66 65 62 73  Temp: (!) 97.4 F (36.3 C) 98 F (36.7 C) 97.9 F (36.6 C) 98 F (36.7 C)  Resp: '18 16 16 18  '$ Height:      Weight:      SpO2: 100% 99% 100% 99%  TempSrc: Oral  Oral Oral  BMI (Calculated):         Discharge exam  GENERAL: No apparent distress.  Nontoxic. HEENT: MMM.  Bulky soft tissue flap in left oral floor.  No apparent drainage.  NECK: Supple.  No apparent JVD.  Dressing over left neck incision site DCI. RESP:  No IWOB.  Fair aeration bilaterally. CVS:  RRR. Heart sounds normal.  ABD/GI/GU: BS+. Abd soft, NTND.  MSK/EXT:  Moves extremities. No apparent deformity. No edema.  SKIN: no apparent skin lesion or wound NEURO: Awake and alert. Oriented appropriately.  No apparent focal neuro deficit. PSYCH: Calm. Normal affect.   Discharge Instructions Discharge Instructions     Diet full liquid   Complete by: As directed    Discharge wound care:   Complete by: As directed    Wound care  Every shift      Comments: Place saline moistened gauze to the left jaw wound and the former trach site. Cover with dry gauze.   Increase activity slowly   Complete by: As directed       Allergies as of 12/21/2021       Reactions   Amlodipine Besy-benazepril Hcl Anaphylaxis, Shortness Of Breath, Swelling   Mouth and tongue swelling   Shellfish Allergy Anaphylaxis, Shortness Of Breath, Swelling        Medication List     STOP taking these  medications    amoxicillin-clavulanate 250-62.5 MG/5ML suspension Commonly known as: AUGMENTIN   cefTRIAXone 1 g in dextrose 5 % 50 mL       TAKE these medications    acetaminophen 500 MG tablet Commonly known as: TYLENOL Place 1,000 mg into feeding tube 2 (two) times daily as needed for mild pain.   aspirin EC 81 MG tablet Take 81 mg by mouth daily. Per tube   atorvastatin 40 MG tablet Commonly known as: LIPITOR Place 40 mg into feeding tube daily.   Basaglar KwikPen 100 UNIT/ML Inject 8 Units into the skin daily.   carvedilol 25 MG tablet Commonly known as: COREG Take 25 mg by mouth every 12 (twelve) hours.   ceFEPIme 2 g in sodium chloride 0.9 % 100 mL Inject 2 g into the vein daily for 8 days. Start taking on: December 22, 2021   chlorhexidine 0.12 % solution Commonly known as: PERIDEX Use as directed 15 mLs in the mouth or throat 3 (three) times daily.   CULTURELLE ADULT ULT BALANCE PO 1 capsule by PEG Tube route daily.   DentaGel 1.1 % Gel dental gel Generic drug: sodium fluoride Place 1 Application onto  teeth at bedtime.   feeding supplement (NEPRO CARB STEADY) Liqd 237 mLs by Per J Tube route 5 (five) times daily.   feeding supplement (PRO-STAT SUGAR FREE 64) Liqd Take 30 mLs by mouth 2 (two) times daily.   ferrous sulfate 220 (44 Fe) MG/5ML solution Place 300 mg into feeding tube daily.   free water Soln Place 200 mLs into feeding tube every 4 (four) hours.   gabapentin 250 MG/5ML solution Commonly known as: NEURONTIN Place 300 mg into feeding tube at bedtime.   hydrALAZINE 100 MG tablet Commonly known as: APRESOLINE Take 1 tablet (100 mg total) by mouth 3 (three) times daily. What changed: how to take this   isosorbide dinitrate 20 MG tablet Commonly known as: ISORDIL Place 1.5 tablets (30 mg total) into feeding tube 3 (three) times daily. What changed: how much to take   melatonin 3 MG Tabs tablet Place 6 mg into feeding tube at bedtime  as needed for sleep.   metroNIDAZOLE 500 MG/100ML Commonly known as: FLAGYL Inject 100 mLs (500 mg total) into the vein every 12 (twelve) hours for 9 days.   NEPHRO VITAMINS PO Place 237 mLs into feeding tube See admin instructions. Five times a day   NovoLOG 100 UNIT/ML injection Generic drug: insulin aspart Inject 2-8 Units into the skin See admin instructions. Check blood sugar 5 times daily. Sliding scale:  141- 180=2 units 181-220=4 units 221-260=6 units 261-300=8 units   nystatin 100000 UNIT/ML suspension Commonly known as: MYCOSTATIN Take 5 mLs by mouth 3 (three) times daily.   oxyCODONE 5 MG immediate release tablet Commonly known as: Oxy IR/ROXICODONE Place 5 mg into feeding tube every 6 (six) hours as needed for severe pain.   pantoprazole sodium 40 mg/20 mL Susp Commonly known as: PROTONIX Place 40 mg into feeding tube daily.   polyethylene glycol 17 g packet Commonly known as: MIRALAX / GLYCOLAX Take 17 g by mouth daily as needed for moderate constipation.   PROSTAT PO Place 30 mLs into feeding tube 2 (two) times daily. Mix 30 ml with 4 oz of water   RA TRUEplus Glucose 15 GM/32ML Gel Generic drug: Glucose Give 15 g by tube daily as needed (low blood sugar). Low Glucose   senna-docusate 8.6-50 MG tablet Commonly known as: Senokot-S Place 2 tablets into feeding tube at bedtime as needed for mild constipation.   terazosin 2 MG capsule Commonly known as: HYTRIN Take 2 mg by mouth daily.   urea 40 % Crea Commonly known as: CARMOL Apply 1 Application topically at bedtime. To thick scaly areas of skin               Discharge Care Instructions  (From admission, onward)           Start     Ordered   12/21/21 0000  Discharge wound care:       Comments: Wound care  Every shift      Comments: Place saline moistened gauze to the left jaw wound and the former trach site. Cover with dry gauze.   12/21/21 1110             Consultations: ENT Infectious disease  Procedures/Studies:   CT Soft Tissue Neck Wo Contrast  Result Date: 12/15/2021 CLINICAL DATA:  History of oral carcinoma. Elevated white blood cell count. EXAM: CT NECK WITHOUT CONTRAST TECHNIQUE: Multidetector CT imaging of the neck was performed following the standard protocol without intravenous contrast. RADIATION DOSE REDUCTION: This exam was performed according  to the departmental dose-optimization program which includes automated exposure control, adjustment of the mA and/or kV according to patient size and/or use of iterative reconstruction technique. COMPARISON:  12/07/2021 FINDINGS: Pharynx and larynx: Unchanged postsurgical appearance the oral cavity and floor mouth. No discrete mass collection. Induration of the subcutaneous fat in the submandibular region is unchanged. Salivary glands: Submandibular and left parotid glands are absent. Thyroid: Normal Lymph nodes: Status post bilateral neck dissection. No cervical lymphadenopathy. Vascular: Unremarkable unenhanced appearance of the vessels. Limited intracranial: Normal Visualized orbits: Normal Mastoids and visualized paranasal sinuses: Negative Skeleton: Unchanged postsurgical appearance of the mandible Upper chest: Clear Other: Unchanged appearance of nodes in the left supraclavicular fossa. IMPRESSION: 1. Unchanged postsurgical appearance of the oral cavity and floor of mouth without discrete mass or fluid collection. 2. Unchanged appearance of fat stranding in the submandibular region, which may be post treatment change. 3. No cervical lymphadenopathy. Electronically Signed   By: Ulyses Jarred M.D.   On: 12/15/2021 19:29   DG Chest Port 1 View  Result Date: 12/15/2021 CLINICAL DATA:  Cough.  Elevated white blood cell count. EXAM: PORTABLE CHEST 1 VIEW COMPARISON:  Radiograph 1 week ago 12/08/2021 FINDINGS: Improved lung aeration. Decreased bibasilar opacities from prior exam, minor residual  right lower lobe atelectasis. No new or progressive airspace disease. Cardiomegaly is stable. No pleural effusion or pneumothorax. Left axillary surgical clips. Possible catheter projecting over the right axilla, only partially included in the field of view. IMPRESSION: 1. Improved lung aeration over the past week with decreased bibasilar opacities. Mild residual right lower lobe atelectasis. No new abnormality. 2. Stable cardiomegaly. 3. Possible catheter projecting over the right axilla versus external artifact. Recommend correlation with physical exam. Electronically Signed   By: Keith Rake M.D.   On: 12/15/2021 18:44   US RENAL  Result Date: 12/08/2021 CLINICAL DATA:  Acute renal failure EXAM: RENAL / URINARY TRACT ULTRASOUND COMPLETE COMPARISON:  None Available. FINDINGS: Right Kidney: Renal measurements: 10.6 x 5.4 x 7.2 cm = volume: 116.4 mL. No hydronephrosis. Increased renal cortical echogenicity. Left Kidney: Renal measurements: 10.9 x 6.7 x 6.1 cm = volume: 129.8 mL. No hydronephrosis. Increased renal cortical echogenicity. Bladder: Mild dependent debris in the posterior bladder. Bilateral ureteral jets are seen. The bladder is well distended. Other: None. IMPRESSION: No hydronephrosis. Increased renal echogenicity as can be seen in medical renal disease. Mild dependent debris in the posterior bladder. Correlate with urinalysis. Electronically Signed   By: Maurine Simmering M.D.   On: 12/08/2021 16:35   DG CHEST PORT 1 VIEW  Result Date: 12/08/2021 CLINICAL DATA:  Cough with history of mandibular cancer. EXAM: PORTABLE CHEST 1 VIEW COMPARISON:  Portable chest 02/11/2018. FINDINGS: The patient's neck soft tissues minimally obscure the medial apices. There is mild-to-moderate cardiomegaly. The lungs expiratory limiting the study. Mediastinum appears stable with calcifications in the aortic arch. There is prominence of the central vessels which could be due to expiration or mild perihilar vascular  congestion. No pleural effusion is seen. There is increased opacity in the hypoinflated bases left-greater-than-right which could be atelectasis, pneumonia or aspiration. No acute osseous abnormality is seen. No pneumothorax. Mild thoracic dextroscoliosis. IMPRESSION: 1. Limited expiratory study. 2. Left-greater-than-right basilar opacities which could be atelectasis or pneumonia. 3. Central vascular prominence which could be due to technique or mild perihilar vascular congestion. No overt edema. 4. Follow-up PA and lateral views recommended in full inspiration. Electronically Signed   By: Telford Nab M.D.   On: 12/08/2021 02:34  CT Soft Tissue Neck Wo Contrast  Result Date: 12/08/2021 CLINICAL DATA:  Initial evaluation for soft tissue swelling, infection suspected. EXAM: CT NECK WITHOUT CONTRAST TECHNIQUE: Multidetector CT imaging of the neck was performed following the standard protocol without intravenous contrast. RADIATION DOSE REDUCTION: This exam was performed according to the departmental dose-optimization program which includes automated exposure control, adjustment of the mA and/or kV according to patient size and/or use of iterative reconstruction technique. COMPARISON:  Prior exams from 11/24/2021 as well as earlier studies. FINDINGS: Pharynx and larynx: Postoperative changes from prior left hemimandibulectomy with flap/fat graft placement, and bilateral nodal dissection. Diffuse soft tissue density seen about the reconstructed mandible as well as the adjacent left submandibular space and submental region. Overlying skin thickening with stranding in the subcutaneous fat, which could reflect post treatment changes. No visible collections evident on this noncontrast examination. No appreciable or residual tumor or mass. Oropharynx and nasopharynx within normal limits. Trace retropharyngeal effusion without loculated collection. Negative epiglottis. Small amount of layering secretions noted within  the hypopharynx. Remainder of the supraglottic larynx and glottis are within normal limits. Subglottic airway patent clear. Salivary glands: Right parotid gland within normal limits. Right submandibular gland likely remains and is grossly unremarkable. The left parotid and submandibular glands appear to have been resected. Thyroid: No significant finding. Lymph nodes: Sequelae of prior bilateral nodal dissection seen within the neck. No visible enlarged or pathologic adenopathy. Mildly prominent nodes at the left supraclavicular fossa measure up to 1 cm in short axis, similar to previous, and indeterminate. Vascular: Scattered atheromatous change about the carotid bifurcations and visualized carotid siphons. Limited intracranial: Unremarkable. Visualized orbits: Prior bilateral ocular lens replacement. Otherwise unremarkable. Mastoids and visualized paranasal sinuses: Mild scattered mucoperiosteal thickening present about the ethmoidal air cells and maxillary sinuses. Visualized paranasal sinuses are otherwise clear. Trace right mastoid effusion noted. Left mastoid air cells are clear. Skeleton: No worrisome osseous lesions. Subcentimeter sclerotic lesion within the T3 vertebral body noted, stable from prior, likely a small benign bone island. Moderate spondylosis present at C3-4 through C6-7. Upper chest: Visualized upper chest demonstrates no acute finding. Partially visualized lungs are largely clear. Sequelae of prior left axillary nodal dissection noted as well. Other: None. IMPRESSION: 1. Postoperative changes from prior left hemimandibulectomy with flap/fat graft placement, and bilateral nodal dissection. 2. Diffuse soft tissue density about the reconstructed mandible as well as the adjacent left submandibular space and submental region with overlying skin thickening and stranding. Findings felt to be most consistent with post treatment changes. No visible residual or recurrent tumor. No discrete or loculated  collections on this noncontrast examination. 3. No enlarged or pathologic adenopathy within the neck. Mildly prominent nodes at the left supraclavicular fossa measure up to 1 cm in short axis, similar to previous, and indeterminate. Attention at follow-up recommended. 4. No other acute abnormality. Electronically Signed   By: Jeannine Boga M.D.   On: 12/08/2021 01:30       The results of significant diagnostics from this hospitalization (including imaging, microbiology, ancillary and laboratory) are listed below for reference.     Microbiology: Recent Results (from the past 240 hour(s))  Blood culture (routine x 2)     Status: None   Collection Time: 12/15/21  6:25 PM   Specimen: BLOOD  Result Value Ref Range Status   Specimen Description   Final    BLOOD BLOOD LEFT WRIST Performed at [facility] [address], [place], [place] [address]    Special  Requests   Final    BOTTLES DRAWN AEROBIC AND ANAEROBIC Blood Culture results may not be optimal due to an excessive volume of blood received in culture bottles Performed at [facility] [address], [place], [place] [address]    Culture   Final    NO GROWTH 5 DAYS Performed at [facility], [place] [address], [place], [place] [address]    Report Status 12/20/2021 FINAL  Final  Blood culture (routine x 2)     Status: None   Collection Time: 12/15/21  6:26 PM   Specimen: BLOOD  Result Value Ref Range Status   Specimen Description   Final    BLOOD BLOOD RIGHT WRIST Performed at [facility] [address] [place], [place] [address]    Special Requests   Final    BOTTLES DRAWN AEROBIC AND ANAEROBIC Blood Culture adequate volume Performed at [facility] [address], [place], [place] [address]    Culture   Final    NO GROWTH 5 DAYS Performed at [facility], [place] [address], [place], [place] [address]    Report Status 12/20/2021  FINAL  Final  Urine Culture     Status: None   Collection Time: 12/15/21  8:52 PM   Specimen: Urine, Clean Catch  Result Value Ref Range Status   Specimen Description   Final    URINE, CLEAN CATCH Performed at [facility], [place] [address] [place], [place] [address]    Special Requests   Final    NONE Performed at [facility], [place] [address], [place], [place] [address]    Culture   Final    NO GROWTH Performed at [facility], [place] [address], [place], [place] [address]    Report Status 12/16/2021 FINAL  Final  Aerobic Culture w Gram Stain (superficial specimen)     Status: None   Collection Time: 12/16/21  6:47 PM   Specimen: Wound  Result Value Ref Range Status   Specimen Description   Final    WOUND Performed at [place] [facility] [address], [place], [place] [address]    Special Requests   Final    NONE MOUTH Performed at [facility] [address], [place], [place] [address]    Gram Stain   Final    NO WBC SEEN RARE GRAM POSITIVE COCCI IN CHAINS RARE GRAM POSITIVE COCCI IN PAIRS Performed at [facility], [place] [address], [place],  [address]    Culture RARE PSEUDOMONAS AERUGINOSA  Final   Report Status 12/19/2021 FINAL  Final   Organism ID, Bacteria PSEUDOMONAS AERUGINOSA  Final      Susceptibility   Pseudomonas aeruginosa - MIC*    CEFTAZIDIME 4 SENSITIVE Sensitive     CIPROFLOXACIN 0.5 SENSITIVE Sensitive     GENTAMICIN <=1 SENSITIVE Sensitive     IMIPENEM 1 SENSITIVE Sensitive     * RARE PSEUDOMONAS AERUGINOSA     Labs:  CBC: Recent Labs  Lab 12/15/21 1826 12/16/21 0548 12/17/21 0546 12/18/21 1039 12/19/21 1036 12/20/21 0554 12/21/21 0610 12/21/21 1121  WBC 24.3*   < > 17.2* 15.5* 14.6* 12.7* 12.3* 14.1*  NEUTROABS 10.5*  --  9.2* 9.2* 8.0*  --   --   --   HGB 9.6*   < > 9.1* 9.3* 9.4* 8.5* 7.6* 8.4*  HCT 30.5*   < > 29.1* 29.8* 30.9* 27.8*  24.4* 26.5*   MCV 103.0*   < > 105.4* 105.3* 105.5* 106.5* 105.6* 104.7*  PLT 247   < > 228 216 204 201 167 200   < > = values in this interval not displayed.   BMP &GFR Recent Labs  Lab 12/17/21 0546 12/17/21 0547 12/18/21 1039 12/19/21 1036 12/20/21 0554 12/21/21 0610  NA  --  144 145 141 143 141  K  --  3.4* 4.1 4.6 3.8 3.7  CL  --  108 112* 112* 114* 114*  CO2  --  26 24 21* 20* 22  GLUCOSE  --  104* 169* 120* 84 102*  BUN  --  69* 68* 62* 64* 66*  CREATININE  --  2.75* 2.76*  2.80* 2.42*  2.42* 2.39*  2.37* 2.60*  CALCIUM  --  9.1 9.4 9.1 9.0 9.0  MG 1.9  --  2.1 2.0 2.1 2.1  PHOS  --  3.1 2.8 2.4* 2.3* 2.6   Estimated Creatinine Clearance: 25.8 mL/min (A) (by C-G formula based on SCr of 2.6 mg/dL (H)). Liver & Pancreas: Recent Labs  Lab 12/15/21 1826 12/17/21 0547 12/18/21 1039 12/19/21 1036 12/20/21 0554 12/21/21 0610  AST 25  --   --   --   --   --   ALT 27  --   --   --   --   --   ALKPHOS 88  --   --   --   --   --   BILITOT 0.4  --   --   --   --   --   PROT 7.6  --   --   --   --   --   ALBUMIN 3.2* 2.8* 3.1* 3.2* 2.8* 2.7*   No results for input(s): "LIPASE", "AMYLASE" in the last 168 hours. No results for input(s): "AMMONIA" in the last 168 hours. Diabetic: Recent Labs    12/20/21 0554  HGBA1C 6.2*   Recent Labs  Lab 12/20/21 0723 12/20/21 1149 12/20/21 1702 12/20/21 2047 12/21/21 0808  GLUCAP 163* 156* 133* 113* 159*   Cardiac Enzymes: No results for input(s): "CKTOTAL", "CKMB", "CKMBINDEX", "TROPONINI" in the last 168 hours. No results for input(s): "PROBNP" in the last 8760 hours. Coagulation Profile: Recent Labs  Lab 12/16/21 0548  INR 1.2   Thyroid Function Tests: No results for input(s): "TSH", "T4TOTAL", "FREET4", "T3FREE", "THYROIDAB" in the last 72 hours. Lipid Profile: No results for input(s): "CHOL", "HDL", "LDLCALC", "TRIG", "CHOLHDL", "LDLDIRECT" in the last 72 hours. Anemia Panel: Recent Labs    12/21/21 0610  VITAMINB12  444  FOLATE 16.8  FERRITIN 138  TIBC 211*  IRON 49  RETICCTPCT 1.9   Urine analysis:    Component Value Date/Time   COLORURINE YELLOW 12/15/2021 2051   APPEARANCEUR CLEAR 12/15/2021 2051   LABSPEC 1.012 12/15/2021 2051   PHURINE 6.0 12/15/2021 2051   GLUCOSEU NEGATIVE 12/15/2021 2051   HGBUR NEGATIVE 12/15/2021 2051   [place] NEGATIVE 12/15/2021 2051   [place] NEGATIVE 12/15/2021 2051   PROTEINUR 30 (A) 12/15/2021 2051   NITRITE NEGATIVE 12/15/2021 2051   LEUKOCYTESUR LARGE (A) 12/15/2021 2051   Sepsis Labs: Invalid input(s): "PROCALCITONIN", "LACTICIDVEN"   SIGNED:  Mercy Riding, MD  [facility] 12/21/2021, 11:58 AM

## 2021-12-21 NOTE — Progress Notes (Signed)
Nutrition Assessment  Reason for Assessment: HNC  66 year old male who resides at [facility] with [facility] of mandible. S/p left hemi-mandibulecomty with nodal biopsies 10/05/21 under the care of Dr. Conley Canal. Gtube in place. Patient is undergoing radiotherapy. Followed by Dr. Isidore Moos  Past medical history includes anemia, CKD, DM2, diabetic neuropathy, Hepatitis C, HLD, HTN, CHF, RBBB, secondary hyperaldosteronism, stroke (2018).    Patient did not show for nutrition appointment today. Will plan to complete assessment at next scheduled visit (7/18).   Medications: lipitor, coreg, D3, catapres, cardura, lasix 20 mg, hydralazine, novolog, imdur, xylocaine, mag-ox, miralax, senokot, oxycodone, lokelma, ferrous sulfate, gabapentin, Bcomplex   Labs: glucose 159, BUN 66, Cr 2.60 7/10 HgbA1c - 6.2  Anthropometrics:   Height: 5'7" Weight: 160 lb 15 oz (7/5) BMI: 25.21   Estimated Energy Needs  Kcals: 2190-2409 Protein: 102-124 Fluid: >2 L  Per chart: -5 cartons Nepro carb steady daily -45 ml ProSource TF BID -200 ml free water 6x/day Provides 2180 kcal, 117 grams protein, 860 ml free water (2060 ml total water with flushes)

## 2021-12-22 ENCOUNTER — Other Ambulatory Visit: Payer: Self-pay

## 2021-12-22 ENCOUNTER — Encounter: Payer: Self-pay | Admitting: Infectious Diseases

## 2021-12-22 ENCOUNTER — Ambulatory Visit
Admission: RE | Admit: 2021-12-22 | Discharge: 2021-12-22 | Disposition: A | Discharge status: Home / Self Care | Payer: No Typology Code available for payment source | Source: Ambulatory Visit | Attending: Radiation Oncology | Admitting: Radiation Oncology

## 2021-12-22 DIAGNOSIS — L089 Local infection of the skin and subcutaneous tissue, unspecified: Secondary | ICD-10-CM | POA: Diagnosis present

## 2021-12-22 LAB — RAD ONC ARIA SESSION SUMMARY
Course Elapsed Days: 7
Plan Fractions Treated to Date: 5
Plan Prescribed Dose Per Fraction: 2 Gy
Plan Total Fractions Prescribed: 30
Plan Total Prescribed Dose: 60 Gy
Reference Point Dosage Given to Date: 10 Gy
Reference Point Session Dosage Given: 2 Gy
Session Number: 5

## 2021-12-22 NOTE — Progress Notes (Signed)
12/20/21 HCV RNA negative

## 2021-12-23 ENCOUNTER — Other Ambulatory Visit: Payer: Self-pay

## 2021-12-23 ENCOUNTER — Ambulatory Visit
Admission: RE | Admit: 2021-12-23 | Discharge: 2021-12-23 | Disposition: A | Discharge status: Home / Self Care | Payer: No Typology Code available for payment source | Source: Ambulatory Visit | Attending: Radiation Oncology | Admitting: Radiation Oncology

## 2021-12-23 DIAGNOSIS — L089 Local infection of the skin and subcutaneous tissue, unspecified: Secondary | ICD-10-CM | POA: Diagnosis not present

## 2021-12-23 LAB — RAD ONC ARIA SESSION SUMMARY
Course Elapsed Days: 8
Plan Fractions Treated to Date: 6
Plan Prescribed Dose Per Fraction: 2 Gy
Plan Total Fractions Prescribed: 30
Plan Total Prescribed Dose: 60 Gy
Reference Point Dosage Given to Date: 12 Gy
Reference Point Session Dosage Given: 2 Gy
Session Number: 6

## 2021-12-24 ENCOUNTER — Other Ambulatory Visit: Payer: Self-pay

## 2021-12-24 ENCOUNTER — Ambulatory Visit
Admission: RE | Admit: 2021-12-24 | Discharge: 2021-12-24 | Disposition: A | Discharge status: Home / Self Care | Payer: No Typology Code available for payment source | Source: Ambulatory Visit | Attending: Radiation Oncology | Admitting: Radiation Oncology

## 2021-12-24 DIAGNOSIS — L089 Local infection of the skin and subcutaneous tissue, unspecified: Secondary | ICD-10-CM | POA: Diagnosis not present

## 2021-12-24 LAB — RAD ONC ARIA SESSION SUMMARY
Course Elapsed Days: 9
Plan Fractions Treated to Date: 7
Plan Prescribed Dose Per Fraction: 2 Gy
Plan Total Fractions Prescribed: 30
Plan Total Prescribed Dose: 60 Gy
Reference Point Dosage Given to Date: 14 Gy
Reference Point Session Dosage Given: 2 Gy
Session Number: 7

## 2021-12-27 ENCOUNTER — Other Ambulatory Visit: Payer: Self-pay

## 2021-12-27 ENCOUNTER — Ambulatory Visit
Admission: RE | Admit: 2021-12-27 | Discharge: 2021-12-27 | Disposition: A | Discharge status: Home / Self Care | Payer: No Typology Code available for payment source | Source: Ambulatory Visit | Attending: Radiation Oncology | Admitting: Radiation Oncology

## 2021-12-27 DIAGNOSIS — L089 Local infection of the skin and subcutaneous tissue, unspecified: Secondary | ICD-10-CM | POA: Diagnosis not present

## 2021-12-27 LAB — RAD ONC ARIA SESSION SUMMARY
Course Elapsed Days: 12
Plan Fractions Treated to Date: 8
Plan Prescribed Dose Per Fraction: 2 Gy
Plan Total Fractions Prescribed: 30
Plan Total Prescribed Dose: 60 Gy
Reference Point Dosage Given to Date: 16 Gy
Reference Point Session Dosage Given: 2 Gy
Session Number: 8

## 2021-12-27 MED ORDER — RADIAPLEXRX EX GEL: Freq: Once | CUTANEOUS | Status: AC

## 2021-12-27 NOTE — Progress Notes (Signed)
Pt here for patient teaching. Pt given Radiation and You booklet, Managing Acute Radiation Side Effects for Head and Neck Cancer handout, skin care instructions, and Radiaplex gel. Reviewed areas of pertinence such as fatigue, hair loss, mouth changes, nausea and vomiting, skin changes, throat changes, cough, earaches, and taste changes. Pt able to give teach back of to pat skin, use unscented/gentle soap, have Imodium on hand, and drink plenty of water, apply Radiaplex bid, avoid applying anything to skin within 4 hours of treatment, and to use an electric razor if they must shave. Pt verbalizes understanding of information given and will contact nursing with any questions or concerns.     [email or web address]

## 2021-12-28 ENCOUNTER — Inpatient Hospital Stay: Payer: Non-veteran care | Admitting: Dietician

## 2021-12-28 ENCOUNTER — Ambulatory Visit
Admission: RE | Admit: 2021-12-28 | Discharge: 2021-12-28 | Disposition: A | Discharge status: Home / Self Care | Payer: No Typology Code available for payment source | Source: Ambulatory Visit | Attending: Radiation Oncology | Admitting: Radiation Oncology

## 2021-12-28 ENCOUNTER — Other Ambulatory Visit: Payer: Self-pay

## 2021-12-28 DIAGNOSIS — L089 Local infection of the skin and subcutaneous tissue, unspecified: Secondary | ICD-10-CM | POA: Diagnosis not present

## 2021-12-28 LAB — RAD ONC ARIA SESSION SUMMARY
Course Elapsed Days: 13
Plan Fractions Treated to Date: 9
Plan Prescribed Dose Per Fraction: 2 Gy
Plan Total Fractions Prescribed: 30
Plan Total Prescribed Dose: 60 Gy
Reference Point Dosage Given to Date: 18 Gy
Reference Point Session Dosage Given: 2 Gy
Session Number: 9

## 2021-12-28 NOTE — Progress Notes (Signed)
Patient did not show for nutrition appointment. 

## 2021-12-29 ENCOUNTER — Other Ambulatory Visit: Payer: Self-pay

## 2021-12-29 ENCOUNTER — Ambulatory Visit
Admission: RE | Admit: 2021-12-29 | Discharge: 2021-12-29 | Disposition: A | Discharge status: Home / Self Care | Payer: No Typology Code available for payment source | Source: Ambulatory Visit | Attending: Radiation Oncology | Admitting: Radiation Oncology

## 2021-12-29 DIAGNOSIS — L089 Local infection of the skin and subcutaneous tissue, unspecified: Secondary | ICD-10-CM | POA: Diagnosis not present

## 2021-12-29 LAB — RAD ONC ARIA SESSION SUMMARY
Course Elapsed Days: 14
Plan Fractions Treated to Date: 10
Plan Prescribed Dose Per Fraction: 2 Gy
Plan Total Fractions Prescribed: 30
Plan Total Prescribed Dose: 60 Gy
Reference Point Dosage Given to Date: 20 Gy
Reference Point Session Dosage Given: 2 Gy
Session Number: 10

## 2021-12-29 NOTE — Progress Notes (Signed)
Oncology Nurse Navigator Documentation   John Jacobson is receiving daily radiation for his head and neck cancer. He currently lives at [facility] rehabilitation center and is receiving PT, SLP, and OT at that facility. Dr. Isidore Moos asked me to contact PT there to request they provide gentle PT for jaw discomfort. I called today and spoke with a nurse who took down my information and will provide it to their therapy department to call me back. I will await a return call to speak to the physical therapist.   Harlow Asa RN, BSN, OCN Head & Neck Oncology Nurse [place] at [facility] Phone # [phone]  Fax # [phone]

## 2021-12-30 ENCOUNTER — Other Ambulatory Visit: Payer: Self-pay

## 2021-12-30 ENCOUNTER — Ambulatory Visit
Admission: RE | Admit: 2021-12-30 | Discharge: 2021-12-30 | Disposition: A | Discharge status: Home / Self Care | Payer: No Typology Code available for payment source | Source: Ambulatory Visit | Attending: Radiation Oncology | Admitting: Radiation Oncology

## 2021-12-30 DIAGNOSIS — L089 Local infection of the skin and subcutaneous tissue, unspecified: Secondary | ICD-10-CM | POA: Diagnosis not present

## 2021-12-30 LAB — RAD ONC ARIA SESSION SUMMARY
Course Elapsed Days: 15
Plan Fractions Treated to Date: 11
Plan Prescribed Dose Per Fraction: 2 Gy
Plan Total Fractions Prescribed: 30
Plan Total Prescribed Dose: 60 Gy
Reference Point Dosage Given to Date: 22 Gy
Reference Point Session Dosage Given: 2 Gy
Session Number: 11

## 2021-12-31 ENCOUNTER — Other Ambulatory Visit: Payer: Self-pay

## 2021-12-31 ENCOUNTER — Ambulatory Visit
Admission: RE | Admit: 2021-12-31 | Discharge: 2021-12-31 | Disposition: A | Discharge status: Home / Self Care | Payer: No Typology Code available for payment source | Source: Ambulatory Visit | Attending: Radiation Oncology | Admitting: Radiation Oncology

## 2021-12-31 DIAGNOSIS — L089 Local infection of the skin and subcutaneous tissue, unspecified: Secondary | ICD-10-CM | POA: Diagnosis not present

## 2021-12-31 LAB — RAD ONC ARIA SESSION SUMMARY
Course Elapsed Days: 16
Plan Fractions Treated to Date: 12
Plan Prescribed Dose Per Fraction: 2 Gy
Plan Total Fractions Prescribed: 30
Plan Total Prescribed Dose: 60 Gy
Reference Point Dosage Given to Date: 24 Gy
Reference Point Session Dosage Given: 2 Gy
Session Number: 12

## 2022-01-03 ENCOUNTER — Other Ambulatory Visit: Payer: Self-pay

## 2022-01-03 ENCOUNTER — Ambulatory Visit
Admission: RE | Admit: 2022-01-03 | Discharge: 2022-01-03 | Disposition: A | Discharge status: Home / Self Care | Payer: No Typology Code available for payment source | Source: Ambulatory Visit | Attending: Radiation Oncology | Admitting: Radiation Oncology

## 2022-01-03 ENCOUNTER — Other Ambulatory Visit: Payer: Self-pay | Admitting: Radiation Oncology

## 2022-01-03 ENCOUNTER — Ambulatory Visit: Payer: No Typology Code available for payment source

## 2022-01-03 DIAGNOSIS — C411 Malignant neoplasm of mandible: Secondary | ICD-10-CM

## 2022-01-03 DIAGNOSIS — L089 Local infection of the skin and subcutaneous tissue, unspecified: Secondary | ICD-10-CM | POA: Diagnosis not present

## 2022-01-03 LAB — RAD ONC ARIA SESSION SUMMARY
Course Elapsed Days: 19
Plan Fractions Treated to Date: 13
Plan Prescribed Dose Per Fraction: 2 Gy
Plan Total Fractions Prescribed: 30
Plan Total Prescribed Dose: 60 Gy
Reference Point Dosage Given to Date: 26 Gy
Reference Point Session Dosage Given: 2 Gy
Session Number: 13

## 2022-01-03 MED ORDER — LIDOCAINE VISCOUS HCL 2 % MT SOLN: OROMUCOSAL | 3 refills | Status: AC

## 2022-01-04 ENCOUNTER — Inpatient Hospital Stay: Payer: No Typology Code available for payment source | Attending: Radiation Oncology | Admitting: Nutrition

## 2022-01-04 ENCOUNTER — Ambulatory Visit
Admission: RE | Admit: 2022-01-04 | Discharge: 2022-01-04 | Disposition: A | Discharge status: Home / Self Care | Payer: No Typology Code available for payment source | Source: Ambulatory Visit | Attending: Radiation Oncology | Admitting: Radiation Oncology

## 2022-01-04 ENCOUNTER — Other Ambulatory Visit: Payer: Self-pay

## 2022-01-04 DIAGNOSIS — L089 Local infection of the skin and subcutaneous tissue, unspecified: Secondary | ICD-10-CM | POA: Diagnosis not present

## 2022-01-04 LAB — RAD ONC ARIA SESSION SUMMARY
Course Elapsed Days: 20
Plan Fractions Treated to Date: 14
Plan Prescribed Dose Per Fraction: 2 Gy
Plan Total Fractions Prescribed: 30
Plan Total Prescribed Dose: 60 Gy
Reference Point Dosage Given to Date: 28 Gy
Reference Point Session Dosage Given: 2 Gy
Session Number: 14

## 2022-01-04 NOTE — Progress Notes (Signed)
Patient's radiation therapy treatment time was changed therefore I contacted patient by telephone at scheduled appointment time.  Patient was agreeable to phone call per nursing and registration.  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  He has a G-tube in place and tolerating 5 cartons of Nepro carb steady with 45 mL Prosource TF twice daily.  He also receives 200 mL free water flushes 6 times a day.  This provides 2180 cal, 117 g protein, and 2060 mL free water.  He is meeting 100% of estimated energy needs.  Patient's weight is stable at 160 pounds. Labs reviewed.  Patient called on cell number however he was not available.  I left my name and my contact information for return call.  I then contacted [facility] rehabilitation center and spoke with his nurse Kizzie Furnish.  She reports patient is tolerating his tube feeding well.  He has no complaints or concerns.  He is being followed by a registered dietitian at [facility] center. I asked nurse to please let patient know that I left a message on his cell phone and to return call if he has questions or concerns.  Nutrition follow-up is scheduled for Tuesday, August 1 after radiation therapy.  We will continue to try to touch base with patient.  No change in tube feeding recommended at this time.  **Disclaimer: This note was dictated with voice recognition software. Similar sounding words can inadvertently be transcribed and this note may contain transcription errors which may not have been corrected upon publication of note.**

## 2022-01-05 ENCOUNTER — Ambulatory Visit
Admission: RE | Admit: 2022-01-05 | Discharge: 2022-01-05 | Disposition: A | Discharge status: Home / Self Care | Payer: No Typology Code available for payment source | Source: Ambulatory Visit | Attending: Radiation Oncology | Admitting: Radiation Oncology

## 2022-01-05 ENCOUNTER — Other Ambulatory Visit: Payer: Self-pay

## 2022-01-05 DIAGNOSIS — L089 Local infection of the skin and subcutaneous tissue, unspecified: Secondary | ICD-10-CM | POA: Diagnosis not present

## 2022-01-05 LAB — RAD ONC ARIA SESSION SUMMARY
Course Elapsed Days: 21
Plan Fractions Treated to Date: 15
Plan Prescribed Dose Per Fraction: 2 Gy
Plan Total Fractions Prescribed: 30
Plan Total Prescribed Dose: 60 Gy
Reference Point Dosage Given to Date: 30 Gy
Reference Point Session Dosage Given: 2 Gy
Session Number: 15

## 2022-01-06 ENCOUNTER — Ambulatory Visit
Admission: RE | Admit: 2022-01-06 | Discharge: 2022-01-06 | Disposition: A | Discharge status: Home / Self Care | Payer: No Typology Code available for payment source | Source: Ambulatory Visit | Attending: Radiation Oncology | Admitting: Radiation Oncology

## 2022-01-06 ENCOUNTER — Other Ambulatory Visit: Payer: Self-pay

## 2022-01-06 DIAGNOSIS — L089 Local infection of the skin and subcutaneous tissue, unspecified: Secondary | ICD-10-CM | POA: Diagnosis not present

## 2022-01-06 LAB — RAD ONC ARIA SESSION SUMMARY
Course Elapsed Days: 22
Plan Fractions Treated to Date: 16
Plan Prescribed Dose Per Fraction: 2 Gy
Plan Total Fractions Prescribed: 30
Plan Total Prescribed Dose: 60 Gy
Reference Point Dosage Given to Date: 32 Gy
Reference Point Session Dosage Given: 2 Gy
Session Number: 16

## 2022-01-06 NOTE — Progress Notes (Shared)
[facility] Note  01/12/2022     CHIEF COMPLAINT Patient presents for Retina Follow Up  HISTORY OF PRESENT ILLNESS: John Jacobson. is a 66 y.o. male who presents to the clinic today for:   HPI     Retina Follow Up   Patient presents with  Diabetic Retinopathy.  In both eyes.  This started 2 weeks ago.  I, the attending physician,  performed the HPI with the patient and updated documentation appropriately.        Comments   Patient here for 2 weeks (8 Months) retina follow up for PDR OU. Patient states vision been watery. Been a while since been here. Been in hospital. Had surgery mouth. Had 4th radiation. To have 6 total. No eye pain.       Last edited by Bernarda Caffey, MD on 01/12/2022 12:56 PM.    Pt recently had surgery for mouth cancer, he is having radiation now, he is supposed to have corrective surgery in 6 months, feels like vision is stable   Referring physician: Angelica Pou, MD [address],  [place] [address]  HISTORICAL INFORMATION:   Selected notes from the MEDICAL RECORD NUMBER Referred by Dr. Dorian Pod [facility]) for DM exam   CURRENT MEDICATIONS: No current outpatient medications on file. (Ophthalmic Drugs)   No current facility-administered medications for this visit. (Ophthalmic Drugs)   Current Outpatient Medications (Other)  Medication Sig   acetaminophen (TYLENOL) 500 MG tablet Place 1,000 mg into feeding tube 2 (two) times daily as needed for mild pain.   Amino Acids-Protein Hydrolys (FEEDING SUPPLEMENT, PRO-STAT SUGAR FREE 64,) LIQD Take 30 mLs by mouth 2 (two) times daily.   aspirin 81 MG EC tablet Take 81 mg by mouth daily. Per tube   atorvastatin (LIPITOR) 40 MG tablet Place 40 mg into feeding tube daily.   B Complex-C-Folic Acid (NEPHRO VITAMINS PO) Place 237 mLs into feeding tube See admin instructions. Five times a day   carvedilol (COREG) 25 MG tablet Take 25 mg by mouth every  12 (twelve) hours.   chlorhexidine (PERIDEX) 0.12 % solution Use as directed 15 mLs in the mouth or throat 3 (three) times daily.   ferrous sulfate 220 (44 Fe) MG/5ML solution Place 300 mg into feeding tube daily.   gabapentin (NEURONTIN) 250 MG/5ML solution Place 300 mg into feeding tube at bedtime.   Glucose (RA TRUEPLUS GLUCOSE) 15 GM/32ML GEL Give 15 g by tube daily as needed (low blood sugar). Low Glucose   hydrALAZINE (APRESOLINE) 100 MG tablet Take 1 tablet (100 mg total) by mouth 3 (three) times daily. (Patient taking differently: Place 100 mg into feeding tube 3 (three) times daily.)   insulin aspart (NOVOLOG) 100 UNIT/ML injection Inject 2-8 Units into the skin See admin instructions. Check blood sugar 5 times daily. Sliding scale:  141- 180=2 units 181-220=4 units 221-260=6 units 261-300=8 units   Insulin Glargine (BASAGLAR KWIKPEN) 100 UNIT/ML Inject 8 Units into the skin daily.   isosorbide dinitrate (ISORDIL) 20 MG tablet Place 1.5 tablets (30 mg total) into feeding tube 3 (three) times daily.   Lactobacillus-Inulin (CULTURELLE ADULT ULT BALANCE PO) 1 capsule by PEG Tube route daily.   lidocaine (XYLOCAINE) 2 % solution Patient: Mix 1part 2% viscous lidocaine, 1part H20. Coat mouth with and swallow 59m of diluted mixture, 382m before meals and at bedtime, up to QID   melatonin 3 MG TABS tablet Place 6  mg into feeding tube at bedtime as needed for sleep.   Nutritional Supplements (FEEDING SUPPLEMENT, NEPRO CARB STEADY,) LIQD 237 mLs by Per J Tube route 5 (five) times daily.   nystatin (MYCOSTATIN) 100000 UNIT/ML suspension Take 5 mLs by mouth 3 (three) times daily.   oxyCODONE (OXY IR/ROXICODONE) 5 MG immediate release tablet Place 5 mg into feeding tube every 6 (six) hours as needed for severe pain.   pantoprazole sodium (PROTONIX) 40 mg/20 mL SUSP Place 40 mg into feeding tube daily.   Pollen Extracts (PROSTAT PO) Place 30 mLs into feeding tube 2 (two) times daily. Mix 30 ml  with 4 oz of water   polyethylene glycol (MIRALAX / GLYCOLAX) 17 g packet Take 17 g by mouth daily as needed for moderate constipation.   senna-docusate (SENOKOT-S) 8.6-50 MG tablet Place 2 tablets into feeding tube at bedtime as needed for mild constipation.   sodium fluoride (DENTAGEL) 1.1 % GEL dental gel Place 1 Application onto teeth at bedtime.   terazosin (HYTRIN) 2 MG capsule Take 2 mg by mouth daily.   urea (CARMOL) 40 % CREA Apply 1 Application topically at bedtime. To thick scaly areas of skin   Water For Irrigation, Sterile (FREE WATER) SOLN Place 200 mLs into feeding tube every 4 (four) hours.   No current facility-administered medications for this visit. (Other)   REVIEW OF SYSTEMS: ROS   Positive for: Neurological, Genitourinary, Endocrine, Cardiovascular, Eyes Negative for: Constitutional, Gastrointestinal, Skin, Musculoskeletal, HENT, Respiratory, Psychiatric, Allergic/Imm, Heme/Lymph Last edited by Theodore Demark, COA on 01/12/2022 12:51 PM.     ALLERGIES Allergies  Allergen Reactions   Amlodipine Besy-Benazepril Hcl Anaphylaxis, Shortness Of Breath and Swelling    Mouth and tongue swelling   Shellfish Allergy Anaphylaxis, Shortness Of Breath and Swelling   PAST MEDICAL HISTORY Past Medical History:  Diagnosis Date   Anemia    Asthma    Chronic kidney disease    Chronic lower back pain 1995   Lumbar back surgery   Diabetic retinopathy ([place])    PDR OU   DM (diabetes mellitus) type II controlled with renal manifestation ([place])    CKD-4, peripheral neuropathy, PAD   Erectile dysfunction due to diabetes mellitus ([place])    Hepatitis C    Hyperlipidemia associated with type 2 diabetes mellitus ([place])    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  Normal EF 60 to 65%.?  GR 1 DD.  Mild LA dilation.;  CKD IV  - Cr 3.2   Hypertensive retinopathy     OU   RBBB (right bundle branch block)    Resistant hypertension    Managed by Dr. 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[facility])    Stroke ([place]) 10/2016   Has residual ataxia and partial left-sided hemiparesis   Past Surgical History:  Procedure Laterality Date   BACK SURGERY  1995   CATARACT EXTRACTION Bilateral    EXCISION MASS NECK N/A 08/06/2021   Procedure: EXCISION OF MANDIBULAR MASS;  Surgeon: Izora Gala, MD;  Location: [facility];  Service: ENT;  Laterality: N/A;   EYE SURGERY Bilateral    Cat Sx   PARS PLANA VITRECTOMY Left 01/25/2017   TOE AMPUTATION Right 2016   Right second toe; dry gangrene   TRANSTHORACIC ECHOCARDIOGRAM  10/2016   TTE May 2018: Normal LV size and severe LVH-without LVOT  gradient/S.A.M.  Normal EF 60 to 65%.?  GR 1 DD.  Mild LA dilation.    FAMILY HISTORY Family History  Problem Relation Age of Onset   Coronary artery disease Mother        MI in her 62s   Diabetes Mother    Macular degeneration Mother    Hypertension Other    Diabetes Other    Alzheimer's disease Other    Coronary artery disease Brother    Diabetes Brother    Diabetes Sister    SOCIAL HISTORY Social History   Tobacco Use   Smoking status: Former    Packs/day: 0.25    Types: Cigarettes    Quit date: 01/07/2018    Years since quitting: 4.0   Smokeless tobacco: Never  Vaping Use   Vaping Use: Never used  Substance Use Topics   Alcohol use: No    Comment: Few beers every other day hx   Drug use: No       OPHTHALMIC EXAM:  Base Eye Exam     Visual Acuity (Snellen - Linear)       Right Left   Dist [place] 20/50 -1 20/20 -1   Dist ph [place] NI          Tonometry (Tonopen, 12:49 PM)       Right Left   Pressure 13 14         Pupils       Dark Light Shape React APD   Right 3 2 Round Brisk None   Left 3 2 Round Brisk None         Visual Fields (Counting fingers)       Left Right    Full Full         Extraocular  Movement       Right Left    Full, Ortho Full, Ortho         Neuro/Psych     Oriented x3: Yes   Mood/Affect: Normal         Dilation     Both eyes: 1.0% Mydriacyl, 2.5% Phenylephrine @ 12:49 PM           Slit Lamp and Fundus Exam     Slit Lamp Exam       Right Left   Lids/Lashes Dermatochalasis - upper lid, mild Meibomian gland dysfunction Dermatochalasis - upper lid, mild Meibomian gland dysfunction   Conjunctiva/Sclera nasal Pinguecula, Melanosis nasal/temporal Pinguecula, Melanosis   Cornea Trace Punctate epithelial erosions, well healed temporal cataract wounds, mild Debris in tear film Mild Debris in tear film, 1-2+ fine Punctate epithelial erosions nasal and inferior, well healed cataract wounds, arcus   Anterior Chamber deep, narrow temporal angle, no cell or flare deep, narrow temporal angle; no cell/flare   Iris Round and poorly dilated to 5.17m, No NVI Round and poorly dilated to 5.239m No NVI   Lens PC IOL in good position PC IOL in good position, mild PC folds   Anterior Vitreous Vitreous syneresis, +condensations greatest inferiorly post vitrectomy         Fundus Exam       Right Left   Disc mild pallor, sharp rim mild pallor, sharp rim   C/D Ratio 0.5 0.5   Macula good foveal reflex, scattered MA/DBH, +Epiretinal membrane, trace cystic changes; scattered exudates - improving; mild scattered fibrosis Flat, good foveal reflex, scattered Microaneurysms, temporal macula very ischemic, persistent edema/cystic changes   Vessels attenuated, Tortuous Vascular attenuation, mild Tortuousity   Periphery  Attached, 360 PRP, good posterior laser fill in, scattered Pala greatest posteriorly Attached, good 360 PRP in place, scattered [place] and Procedures for '@TODAY'$ @  Rad Onc [place] Session Summary      Component Value Flag Ref Range Units Status   Course ID [id number]        Final   Course Start Date 12/03/2021        Final    Session Number 20        Final   Course First Treatment Date 12/15/2021  2:46 PM        Final   Course Last Treatment Date 01/12/2022 11:19 AM        Final   Course Elapsed Days 28        Final   Reference Point ID dp        Final   Reference Point Dosage Given to Date 39.9999998       Gy Final   Reference Point Session Dosage Given [id number]       Gy Final   Plan ID [email or web address]        Final   Plan Fractions Treated to Date 20        Final   Plan Total Fractions Prescribed 30        Final   Plan Prescribed Dose Per Fraction 2       Gy Final   Plan Total Prescribed Dose 60.000000       Gy Final   Plan Primary Reference Point dp        Final           OCT, Retina - OU - Both Eyes       Right Eye Quality was good. Central Foveal Thickness: 227. Progression has been stable. Findings include no SRF, abnormal foveal contour, intraretinal hyper-reflective material, epiretinal membrane, intraretinal fluid, macular pucker, outer retinal atrophy (Scattered cystic changes nasal fovea, interval improvement vitreous opacities).   Left Eye Quality was good. Central Foveal Thickness: 246. Progression has worsened. Findings include no SRF, abnormal foveal contour, epiretinal membrane, intraretinal fluid, inner retinal atrophy, outer retinal atrophy (Persistent IRF temporal macula -- slightly increased temporal fovea).   Notes *Images captured and stored on drive  Diagnosis / Impression:  OU: ERM, diffuse atrophy, +DME OD: Scattered cystic changes nasal fovea, interval improvement vitreous opacities OS: Persistent IRF temporal macula -- slightly increased temporal fovea  Clinical management:  See below  Abbreviations: NFP - Normal foveal profile. CME - cystoid macular edema. PED - pigment epithelial detachment. IRF - intraretinal fluid. SRF - subretinal fluid. EZ - ellipsoid zone. ERM - epiretinal membrane. ORA - outer retinal atrophy. ORT - outer retinal tubulation. SRHM - subretinal  hyper-reflective material            ASSESSMENT/PLAN:    ICD-10-CM   1. Proliferative diabetic retinopathy of both eyes with macular edema associated with type 2 diabetes mellitus ([place])  E11.3513 OCT, Retina - OU - Both Eyes    2. Epiretinal membrane (ERM) of both eyes  H35.373     3. Essential hypertension  I10     4. Hypertensive retinopathy of both eyes  H35.033     5. Pseudophakia, both eyes  Z96.1     6. Dry eyes  H04.123      1. Proliferative diabetic retinopathy w/ DME, OU  - lost to f/u from 12.19.22 to 08.02.23 (  9 mos) due to oral cancer (SCC)  - lost to f/u from 4.11.22 to 9.26.22 -- 5+ mos, CHF exacerbations  - former pt of Dwana Melena at [facility] and [facility] at [facility] -- known history of proliferative diabetic retinopathy  - history of PPV OS and laser PRP OD with Dr. Manuella Ghazi for PDR OU w/ TRD OS -- 01/25/17  - history of intravitreal anti-VEGF therapy with Dr. Anderson Malta -- last IVE ~01/2018  - s/p PRP fill in OD (02.27.20) -- good fill in laser in place  - s/p IVA OU #1 (02.13.20), #2 (03.12.20), #3 (05.10.20), #4 (06.08.20), #5 (07.06.20), #6 (08.03.20), #7 (08.31.20), #8 (09.28.20), #9 (10.26.20)  - ?resistance to IVA  - s/p IVE OU #1 (11.23.20), #2 (01.22.21), #3 (03.05.21), #4 (04.02.21), #5 (04.30.21), #6 (06.07.21), #7 (07.19.21), #8 (08.25.21), #9 (10.06.21), #10 (11.10.21), #11 (12.20.21), #12 (02.14.22), #13 (04.11.22), #14 (09.26.22)  - exam shows stable regression of fine NVD OD, PRP laser in place OU  - FA (08.31.20) shows retinal NV vastly improved OD; significant vascular perfusion defects and increased FAZ OU; late leaking MA OU  - OCT shows OD: Scattered cystic changes nasal fovea, interval improvement vitreous opacities; OS: Persistent IRF temporal macula -- slightly increased temporal fovea  - BCVA stable at 20/50 OD, OS improved to 20/20 from 20/25 -- pt is monovision, OD: near, OS: distance  - recommend holding off on IVE OU today  08.02.23 -- eyes relatively stable despite delayed f/u and still undergoing radiation treatment  - [facility] informed consent form signed and scanned on 01.22.2021  - [facility] Benefits Investigation initiated 10.26.2020 -- approved as of 11.30.20  - [facility] has re authorized IVE injections  - f/u 6-8 weeks, DFE/OCT, possible injection(s) -- OD eye is near eye, check vision with near card  2. Epiretinal membrane, both eyes   - mild ERM OU  - asymptomatic, no metamorphopsia  - no indication for surgery at this time  - monitor for now  3,4. Hypertensive retinopathy OU  - discussed importance of tight BP control  - monitor  5. Pseudophakia OU  - s/p CE/IOL OU with expert surgeon, Dr. Kathlen Mody (OD: 2.25.21, OS: 03.24.21)  - beautiful surgeries, doing well  - post op drops per Dr. Kathlen Mody  - monitor  6. Dry eyes OU  - improving  - recommend artificial tears and lubricating ointment as needed  Ophthalmic Meds Ordered this visit:  No orders of the defined types were placed in this encounter.    Return for f/u 6-8 weeks, PDR OU, DFE, OCT.  There are no Patient Instructions on file for this visit.  This document serves as a record of services personally performed by Gardiner Sleeper, MD, PhD. It was created on their behalf by Orvan Falconer, an ophthalmic technician. The creation of this record is the provider's dictation and/or activities during the visit.    Electronically signed by: Orvan Falconer, OA, 01/13/22  11:12 AM  This document serves as a record of services personally performed by Gardiner Sleeper, MD, PhD. It was created on their behalf by San Jetty. Owens Shark, OA an ophthalmic technician. The creation of this record is the provider's dictation and/or activities during the visit.    Electronically signed by: San Jetty. Owens Shark, [place] 08.02.2023 11:12 AM  Gardiner Sleeper, M.D., Ph.D. Diseases & Surgery of the Retina and Vitreous [facility]  I have reviewed the  above documentation for accuracy and completeness, and I agree  with the above. Gardiner Sleeper, M.D., Ph.D. 01/13/22 11:12 AM   Abbreviations: M myopia (nearsighted); A astigmatism; H hyperopia (farsighted); P presbyopia; Mrx spectacle prescription;  CTL contact lenses; OD right eye; OS left eye; OU both eyes  XT exotropia; ET esotropia; PEK punctate epithelial keratitis; PEE punctate epithelial erosions; DES dry eye syndrome; MGD meibomian gland dysfunction; ATs artificial tears; PFAT's preservative free artificial tears; [place] nuclear sclerotic cataract; PSC posterior subcapsular cataract; ERM epi-retinal membrane; PVD posterior vitreous detachment; RD retinal detachment; DM diabetes mellitus; DR diabetic retinopathy; NPDR non-proliferative diabetic retinopathy; PDR proliferative diabetic retinopathy; CSME clinically significant macular edema; DME diabetic macular edema; dbh dot blot hemorrhages; CWS cotton wool spot; POAG primary open angle glaucoma; C/D cup-to-disc ratio; HVF humphrey visual field; GVF goldmann visual field; OCT optical coherence tomography; IOP intraocular pressure; BRVO Branch retinal vein occlusion; CRVO central retinal vein occlusion; CRAO central retinal artery occlusion; BRAO branch retinal artery occlusion; RT retinal tear; SB scleral buckle; PPV pars plana vitrectomy; VH Vitreous hemorrhage; PRP panretinal laser photocoagulation; IVK intravitreal kenalog; VMT vitreomacular traction; MH Macular hole;  NVD neovascularization of the disc; NVE neovascularization elsewhere; AREDS age related eye disease study; ARMD age related macular degeneration; POAG primary open angle glaucoma; EBMD epithelial/anterior basement membrane dystrophy; ACIOL anterior chamber intraocular lens; IOL intraocular lens; PCIOL posterior chamber intraocular lens; Phaco/IOL phacoemulsification with intraocular lens placement; [place] photorefractive keratectomy; LASIK laser assisted in situ keratomileusis; HTN  hypertension; DM diabetes mellitus; COPD chronic obstructive pulmonary disease

## 2022-01-07 ENCOUNTER — Other Ambulatory Visit: Payer: Self-pay

## 2022-01-07 ENCOUNTER — Encounter: Payer: Self-pay | Admitting: Infectious Diseases

## 2022-01-07 ENCOUNTER — Ambulatory Visit (INDEPENDENT_AMBULATORY_CARE_PROVIDER_SITE_OTHER): Payer: No Typology Code available for payment source | Admitting: Infectious Diseases

## 2022-01-07 ENCOUNTER — Ambulatory Visit
Admission: RE | Admit: 2022-01-07 | Discharge: 2022-01-07 | Disposition: A | Discharge status: Home / Self Care | Payer: No Typology Code available for payment source | Source: Ambulatory Visit | Attending: Radiation Oncology | Admitting: Radiation Oncology

## 2022-01-07 VITALS — BP 174/75 | HR 50 | Temp 97.3°F | Wt 152.0 lb

## 2022-01-07 DIAGNOSIS — A498 Other bacterial infections of unspecified site: Secondary | ICD-10-CM | POA: Diagnosis not present

## 2022-01-07 DIAGNOSIS — D72829 Elevated white blood cell count, unspecified: Secondary | ICD-10-CM

## 2022-01-07 DIAGNOSIS — L089 Local infection of the skin and subcutaneous tissue, unspecified: Secondary | ICD-10-CM | POA: Diagnosis not present

## 2022-01-07 LAB — RAD ONC ARIA SESSION SUMMARY
Course Elapsed Days: 23
Plan Fractions Treated to Date: 17
Plan Prescribed Dose Per Fraction: 2 Gy
Plan Total Fractions Prescribed: 30
Plan Total Prescribed Dose: 60 Gy
Reference Point Dosage Given to Date: 34 Gy
Reference Point Session Dosage Given: 2 Gy
Session Number: 17

## 2022-01-07 NOTE — Progress Notes (Unsigned)
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   PAD (peripheral artery disease) ([place]) 08/17/2020   Stroke ([place]) 11/15/2016   History of stroke    Hepatitis C virus infection without hepatic coma    Brainstem infarct, acute (HCC)    Hypokalemia    Leukocytosis    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 ([place])    Cerebellar infarction ([place]) 11/04/2016   Hyperlipidemia due to type 2 diabetes mellitus (HCC)    Gait disturbance, post-stroke    Acute renal failure superimposed on stage 3b chronic kidney disease ([place]) 11/02/2016   Ataxia 11/02/2016   Ischemic stroke ([place]) 11/02/2016   Puncture wound of right foot 11/02/2016   Hypertensive heart and chronic kidney disease with heart failure and stage 1 through stage 4 chronic kidney disease, or chronic kidney disease ([place]) 11/02/2016   Chronic left-sided low back pain 11/02/2016   Demand ischemia ([place])    Chest pain with moderate risk for cardiac etiology 05/24/2011   Tobacco abuse 05/24/2011   DM (diabetes mellitus), type 2 with renal complications ([place]) 38/25/0539    Patient's Medications  New Prescriptions   No medications on file  Previous Medications   ACETAMINOPHEN (TYLENOL) 500 MG TABLET    Place 1,000 mg into feeding tube 2 (two) times daily as needed for mild pain.   AMINO ACIDS-PROTEIN HYDROLYS (FEEDING SUPPLEMENT, PRO-STAT SUGAR FREE 64,) LIQD    Take 30 mLs by mouth 2 (two) times daily.   ASPIRIN 81 MG EC TABLET    Take 81 mg by mouth daily. Per tube   ATORVASTATIN (LIPITOR) 40  MG TABLET    Place 40 mg into feeding tube daily.   B COMPLEX-C-FOLIC ACID (NEPHRO VITAMINS PO)    Place 237 mLs into feeding tube See admin instructions. Five times a day   CARVEDILOL (COREG) 25 MG TABLET    Take 25 mg by mouth every 12 (twelve) hours.   CHLORHEXIDINE (PERIDEX) 0.12 % SOLUTION    Use as directed 15 mLs in the mouth or throat 3 (three) times daily.   FERROUS SULFATE 220 (44 FE) MG/5ML SOLUTION    Place 300 mg into feeding tube daily.   GABAPENTIN (NEURONTIN) 250 MG/5ML SOLUTION    Place 300 mg into feeding tube at bedtime.   GLUCOSE (RA TRUEPLUS GLUCOSE) 15 GM/32ML GEL    Give 15 g by tube daily as needed (low blood sugar). Low Glucose   HYDRALAZINE (APRESOLINE) 100 MG TABLET    Take 1 tablet (100 mg total) by mouth 3 (three) times daily.   INSULIN ASPART (NOVOLOG) 100 UNIT/ML INJECTION    Inject 2-8 Units into the skin See admin instructions. Check blood sugar 5 times daily. Sliding scale:  141- 180=2 units 181-220=4 units 221-260=6 units 261-300=8 units   INSULIN GLARGINE (BASAGLAR KWIKPEN) 100 UNIT/ML    Inject 8 Units into the skin daily.   ISOSORBIDE DINITRATE (ISORDIL) 20 MG TABLET    Place 1.5 tablets (30 mg total) into feeding tube 3 (three) times daily.   LACTOBACILLUS-INULIN (CULTURELLE ADULT ULT BALANCE  PO)    1 capsule by PEG Tube route daily.   LIDOCAINE (XYLOCAINE) 2 % SOLUTION    Patient: Mix 1part 2% viscous lidocaine, 1part H20. Coat mouth with and swallow 93m of diluted mixture, 37m before meals and at bedtime, up to QID   MELATONIN 3 MG TABS TABLET    Place 6 mg into feeding tube at bedtime as needed for sleep.   NUTRITIONAL SUPPLEMENTS (FEEDING SUPPLEMENT, NEPRO CARB STEADY,) LIQD    237 mLs by Per J Tube route 5 (five) times daily.   NYSTATIN (MYCOSTATIN) 100000 UNIT/ML SUSPENSION    Take 5 mLs by mouth 3 (three) times daily.   OXYCODONE (OXY IR/ROXICODONE) 5 MG IMMEDIATE RELEASE TABLET    Place 5 mg into feeding tube every 6 (six) hours as needed for  severe pain.   PANTOPRAZOLE SODIUM (PROTONIX) 40 MG/20 ML SUSP    Place 40 mg into feeding tube daily.   POLLEN EXTRACTS (PROSTAT PO)    Place 30 mLs into feeding tube 2 (two) times daily. Mix 30 ml with 4 oz of water   POLYETHYLENE GLYCOL (MIRALAX / GLYCOLAX) 17 G PACKET    Take 17 g by mouth daily as needed for moderate constipation.   SENNA-DOCUSATE (SENOKOT-S) 8.6-50 MG TABLET    Place 2 tablets into feeding tube at bedtime as needed for mild constipation.   SODIUM FLUORIDE (DENTAGEL) 1.1 % GEL DENTAL GEL    Place 1 Application onto teeth at bedtime.   TERAZOSIN (HYTRIN) 2 MG CAPSULE    Take 2 mg by mouth daily.   UREA (CARMOL) 40 % CREA    Apply 1 Application topically at bedtime. To thick scaly areas of skin   WATER FOR IRRIGATION, STERILE (FREE WATER) SOLN    Place 200 mLs into feeding tube every 4 (four) hours.  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 SuConley Canalnd Dr. PaHendricks Limesho is referred for leukocytosis. Patient was recently hospitalized 7/5-7/11 for Left submandibular SSTI ( wound cx PsA). Seen by ENT, recommended conservative management.Patient was discharged on IV cefepime and metronidazole for 2 weeks through midline. EOT 12/29/21.  Today's Visit Seen by ENT 7/25. Per notes, swelling was noted to progressive since start of radiation but no drainage. Exam " Intraorally the flap is in place and healthy. Scattered yeast plaques in the oral cavity were suctioned. No drainage with firm massage of the left neck. The flap is bulky pushing his native tongue to the right and  posterior Left neck wound has healed. Firm edema in the right face and mandible area."  Patient is currently getting radiation therapy. Complains of stiffness and tightening in the oral cavity and left cheek due to radiation. Denies any worsening intraoral pain, neck pain or drainage. He got his radiation this morning. Denies fevers, chills,. Denies nausea, vomiting and diarrhea. Midline was removed 2 days ago after completion of course of IV cefepime. Feels significantly better than when seen in the hospital.   Review of Systems: all systems reviewed with pertinent positives and negatives as listed above  Past Medical History:  Diagnosis Date   Anemia  Asthma    Chronic kidney disease    Chronic lower back pain 1995   Lumbar back surgery   Diabetic retinopathy ([place])    PDR OU   DM (diabetes mellitus) type II controlled with renal manifestation ([place])    CKD-4, peripheral neuropathy, PAD   Erectile dysfunction due to diabetes mellitus ([place])    Hepatitis C    Hyperlipidemia associated with type 2 diabetes mellitus ([place])    Hypertensive heart disease with congestive heart failure and chronic kidney disease ([place])    TTE May 2018: Normal LV size and severe LVH-without LVOT gradient/S.A.M.  Normal EF 60 to 65%.?  GR 1 DD.  Mild LA dilation.;  CKD IV  - Cr 3.2   Hypertensive retinopathy    OU   RBBB (right bundle branch block)    Resistant hypertension    Managed by Dr. 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[facility])    Stroke ([place]) 10/2016   Has residual ataxia and partial left-sided hemiparesis   Past Surgical History:  Procedure Laterality Date   BACK SURGERY  1995   CATARACT EXTRACTION Bilateral    EXCISION MASS NECK N/A 08/06/2021   Procedure: EXCISION OF MANDIBULAR MASS;  Surgeon: Izora Gala, MD;  Location: [facility];  Service: ENT;  Laterality: N/A;   EYE SURGERY Bilateral    Cat Sx   PARS PLANA VITRECTOMY Left 01/25/2017   TOE AMPUTATION Right  2016   Right second toe; dry gangrene   TRANSTHORACIC ECHOCARDIOGRAM  10/2016   TTE May 2018: Normal LV size and severe LVH-without LVOT gradient/S.A.M.  Normal EF 60 to 65%.?  GR 1 DD.  Mild LA dilation.    Social History   Tobacco Use   Smoking status: Former    Packs/day: 0.25    Types: Cigarettes    Quit date: 01/07/2018    Years since quitting: 4.0   Smokeless tobacco: Never  Vaping Use   Vaping Use: Never used  Substance Use Topics   Alcohol use: No    Comment: Few beers every other day hx   Drug use: No    Family History  Problem Relation Age of Onset   Coronary artery disease Mother        MI in her 87s   Diabetes Mother    Macular degeneration Mother    Hypertension Other    Diabetes Other    Alzheimer's disease Other    Coronary artery disease Brother    Diabetes Brother    Diabetes Sister     Allergies  Allergen Reactions   Amlodipine Besy-Benazepril Hcl Anaphylaxis, Shortness Of Breath and Swelling    Mouth and tongue swelling   Shellfish Allergy Anaphylaxis, Shortness Of Breath and Swelling    Health Maintenance  Topic Date Due   FOOT EXAM  Never done   OPHTHALMOLOGY EXAM  Never done   Zoster Vaccines- Shingrix (1 of 2) Never done   COLONOSCOPY (Pts 45-47yr Insurance coverage will need to be confirmed)  Never done   Pneumonia Vaccine 67 Years old (2 - PPSV23 or PCV20) 11/23/2017   INFLUENZA VACCINE  01/11/2022   HEMOGLOBIN A1C  06/22/2022   TETANUS/TDAP  11/03/2026   COVID-19 Vaccine  Completed   Hepatitis C Screening  Completed   HPV VACCINES  Aged Out    Objective:  Vitals:   01/07/22 1404  BP: (!) 174/75  Pulse: (!) 50  Temp: (!) 97.3 F (36.3 C)  TempSrc: Axillary  Weight: 152 lb (68.9 kg)   Body mass index is 23.81 kg/m.  Physical Exam Constitutional:      Appearance: Elderly looking male, comfortable  HENT:     Head: Normocephalic and atraumatic.      Mouth:  Intra oral flap looks healthy. No purulent drainage. Firm  edema in the rt lower face and lips. Left neck wound has healed       Eyes:    Conjunctiva/sclera: Conjunctivae normal.     Pupils:   Cardiovascular:     Rate and Rhythm: Normal rate and regular rhythm.     Heart sounds:   Pulmonary:     Effort: Pulmonary effort is normal.     Breath sounds: Normal breath sounds.   Abdominal:     General: Non distended     Palpations: soft. PEG+  Musculoskeletal:        General: Normal range of motion.   Skin:    General: Skin is warm and dry.     Comments:  Neurological:     General: grossly non focal     Mental Status: awake, alert and oriented to person, place, and time.   Psychiatric:        Mood and Affect: Mood normal.   Lab Results Lab Results  Component Value Date   WBC 14.1 (H) 12/21/2021   HGB 8.4 (L) 12/21/2021   HCT 26.5 (L) 12/21/2021   MCV 104.7 (H) 12/21/2021   PLT 200 12/21/2021    Lab Results  Component Value Date   CREATININE 2.60 (H) 12/21/2021   BUN 66 (H) 12/21/2021   NA 141 12/21/2021   K 3.7 12/21/2021   CL 114 (H) 12/21/2021   CO2 22 12/21/2021    Lab Results  Component Value Date   ALT 27 12/15/2021   AST 25 12/15/2021   ALKPHOS 88 12/15/2021   BILITOT 0.4 12/15/2021    Lab Results  Component Value Date   CHOL 149 11/03/2016   HDL 37 (L) 11/03/2016   LDLCALC 94 11/03/2016   TRIG 91 11/03/2016   CHOLHDL 4.0 11/03/2016   No results found for: "LABRPR", "RPRTITER" No results found for: "HIV1RNAQUANT", "HIV1RNAVL", "CD4TABS"   Problem List Items Addressed This Visit       Other   Leukocytosis - Primary   Pseudomonas infection   Assessment/Plan # Leukocytosis, downctrending  7/18 WBC 13.4, 7/14 13, 7/11 14 Low concerns for new infection and likely reactive in the setting of ongoing radiation and inflammation  # Left submandibular SSTI without evidence of deep infection/abscess/bone infection  -Completed 2 weeks of Iv cefepime and metronidazole.  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 Conley Canal and Dr. Hendricks Limes - seen by ENT 7/25, on augmentin suspension. No concerns for infcetion - On radiation, last one was today   # Dysphagia s/p PEG   I have personally spent 65 minutes involved in face-to-face and non-face-to-face activities for this patient on the day of the visit. Professional time spent includes the following activities: Preparing to see the patient (review of tests), Obtaining and/or reviewing separately obtained history (admission/discharge record), Performing a medically appropriate examination and/or evaluation , Ordering medications/tests/procedures, referring and communicating with other health care professionals, Documenting clinical information in the EMR, Independently interpreting results (not  separately reported), Communicating results to the patient/family/caregiver, Counseling and educating the patient/family/caregiver and Care coordination (not separately reported).   Wilber Oliphant, [facility] [facility] 01/07/2022, 2:18 PM

## 2022-01-10 ENCOUNTER — Ambulatory Visit
Admission: RE | Admit: 2022-01-10 | Discharge: 2022-01-10 | Disposition: A | Discharge status: Home / Self Care | Payer: No Typology Code available for payment source | Source: Ambulatory Visit | Attending: Radiation Oncology | Admitting: Radiation Oncology

## 2022-01-10 ENCOUNTER — Ambulatory Visit: Payer: No Typology Code available for payment source

## 2022-01-10 ENCOUNTER — Other Ambulatory Visit: Payer: Self-pay

## 2022-01-10 DIAGNOSIS — L089 Local infection of the skin and subcutaneous tissue, unspecified: Secondary | ICD-10-CM | POA: Diagnosis not present

## 2022-01-10 LAB — RAD ONC ARIA SESSION SUMMARY
Course Elapsed Days: 26
Plan Fractions Treated to Date: 18
Plan Prescribed Dose Per Fraction: 2 Gy
Plan Total Fractions Prescribed: 30
Plan Total Prescribed Dose: 60 Gy
Reference Point Dosage Given to Date: 36 Gy
Reference Point Session Dosage Given: 2 Gy
Session Number: 18

## 2022-01-11 ENCOUNTER — Inpatient Hospital Stay: Payer: No Typology Code available for payment source | Attending: Radiation Oncology | Admitting: Nutrition

## 2022-01-11 ENCOUNTER — Ambulatory Visit
Admission: RE | Admit: 2022-01-11 | Discharge: 2022-01-11 | Disposition: A | Discharge status: Home / Self Care | Payer: No Typology Code available for payment source | Source: Ambulatory Visit | Attending: Radiation Oncology | Admitting: Radiation Oncology

## 2022-01-11 ENCOUNTER — Other Ambulatory Visit: Payer: Self-pay

## 2022-01-11 DIAGNOSIS — L089 Local infection of the skin and subcutaneous tissue, unspecified: Secondary | ICD-10-CM | POA: Diagnosis present

## 2022-01-11 LAB — RAD ONC ARIA SESSION SUMMARY
Course Elapsed Days: 27
Plan Fractions Treated to Date: 19
Plan Prescribed Dose Per Fraction: 2 Gy
Plan Total Fractions Prescribed: 30
Plan Total Prescribed Dose: 60 Gy
Reference Point Dosage Given to Date: 38 Gy
Reference Point Session Dosage Given: 2 Gy
Session Number: 19

## 2022-01-11 NOTE — Progress Notes (Signed)
Patient presents to nutrition follow-up using a walker.  He is a 66 year old male diagnosed with SCC of the mandible.  He is receiving radiation therapy and followed by Dr. Isidore Moos.  Past medical history includes anemia, chronic kidney disease, diabetes type 2, diabetic neuropathy, hepatitis C, hyperlipidemia, hypertension, CHF, RBBB, secondary hyperaldosteronism, stroke.  Weight documented as 154 pounds which is decreased from 160 pounds.  Labs reviewed.  He states several weeks ago he was in a wheelchair so he is improving. Reports he feels well overall. States he is tolerating Nepro carb steady and Prosource on a daily basis via PEG.  Sometimes he does not get his recommended goal of 5 cartons.  States he is receiving free water flushes multiple times throughout the day.  He continues to live at [facility].  Patient is consuming ice chips but relays that he is not eating or drinking anything else.  Estimated nutrition needs: 2200-2450 cal, 100-120 g protein, greater than 2.2 L fluid.  5 cartons Nepro carb steady with 45 mL Prosource TF twice daily and 200 mL free water flushes 6 times a day provides 2180 cal, 117 g protein, 2060 mL free water.  Nutrition diagnosis: Moderate malnutrition related to cancer and associated treatments as evidenced by moderate fat and muscle depletion.  Intervention: Encourage patient to be diligent about accepting 5 cartons of Nepro carb steady daily with 45 mL Prosource TF twice daily. Continue 200 mL free water 6 times a day. Oral diet per MD.  Monitoring, evaluation, goals: Patient will tolerate tube feeding at goal to minimize weight loss.  Next visit: Tuesday, August 8 after radiation therapy.  **Disclaimer: This note was dictated with voice recognition software. Similar sounding words can inadvertently be transcribed and this note may contain transcription errors which may not have been corrected upon publication of note.**

## 2022-01-12 ENCOUNTER — Other Ambulatory Visit: Payer: Self-pay

## 2022-01-12 ENCOUNTER — Encounter (INDEPENDENT_AMBULATORY_CARE_PROVIDER_SITE_OTHER): Payer: Self-pay | Admitting: Ophthalmology

## 2022-01-12 ENCOUNTER — Ambulatory Visit (INDEPENDENT_AMBULATORY_CARE_PROVIDER_SITE_OTHER): Payer: Medicare (Managed Care) | Admitting: Ophthalmology

## 2022-01-12 ENCOUNTER — Ambulatory Visit
Admission: RE | Admit: 2022-01-12 | Discharge: 2022-01-12 | Disposition: A | Discharge status: Home / Self Care | Payer: No Typology Code available for payment source | Source: Ambulatory Visit | Attending: Radiation Oncology | Admitting: Radiation Oncology

## 2022-01-12 DIAGNOSIS — L089 Local infection of the skin and subcutaneous tissue, unspecified: Secondary | ICD-10-CM | POA: Diagnosis not present

## 2022-01-12 DIAGNOSIS — H35033 Hypertensive retinopathy, bilateral: Secondary | ICD-10-CM | POA: Diagnosis not present

## 2022-01-12 DIAGNOSIS — H35373 Puckering of macula, bilateral: Secondary | ICD-10-CM | POA: Diagnosis not present

## 2022-01-12 DIAGNOSIS — E113513 Type 2 diabetes mellitus with proliferative diabetic retinopathy with macular edema, bilateral: Secondary | ICD-10-CM

## 2022-01-12 DIAGNOSIS — I10 Essential (primary) hypertension: Secondary | ICD-10-CM

## 2022-01-12 DIAGNOSIS — H04123 Dry eye syndrome of bilateral lacrimal glands: Secondary | ICD-10-CM

## 2022-01-12 DIAGNOSIS — Z961 Presence of intraocular lens: Secondary | ICD-10-CM

## 2022-01-12 LAB — RAD ONC ARIA SESSION SUMMARY
Course Elapsed Days: 28
Plan Fractions Treated to Date: 20
Plan Prescribed Dose Per Fraction: 2 Gy
Plan Total Fractions Prescribed: 30
Plan Total Prescribed Dose: 60 Gy
Reference Point Dosage Given to Date: 40 Gy
Reference Point Session Dosage Given: 2 Gy
Session Number: 20

## 2022-01-12 MED ORDER — RADIAPLEXRX EX GEL: Freq: Once | CUTANEOUS | Status: AC

## 2022-01-13 ENCOUNTER — Ambulatory Visit
Admission: RE | Admit: 2022-01-13 | Discharge: 2022-01-13 | Disposition: A | Discharge status: Home / Self Care | Payer: No Typology Code available for payment source | Source: Ambulatory Visit | Attending: Radiation Oncology | Admitting: Radiation Oncology

## 2022-01-13 ENCOUNTER — Other Ambulatory Visit: Payer: Self-pay

## 2022-01-13 DIAGNOSIS — L089 Local infection of the skin and subcutaneous tissue, unspecified: Secondary | ICD-10-CM | POA: Diagnosis not present

## 2022-01-13 LAB — RAD ONC ARIA SESSION SUMMARY
Course Elapsed Days: 29
Plan Fractions Treated to Date: 21
Plan Prescribed Dose Per Fraction: 2 Gy
Plan Total Fractions Prescribed: 30
Plan Total Prescribed Dose: 60 Gy
Reference Point Dosage Given to Date: 42 Gy
Reference Point Session Dosage Given: 2 Gy
Session Number: 21

## 2022-01-14 ENCOUNTER — Other Ambulatory Visit: Payer: Self-pay

## 2022-01-14 ENCOUNTER — Ambulatory Visit
Admission: RE | Admit: 2022-01-14 | Discharge: 2022-01-14 | Disposition: A | Discharge status: Home / Self Care | Payer: No Typology Code available for payment source | Source: Ambulatory Visit | Attending: Radiation Oncology | Admitting: Radiation Oncology

## 2022-01-14 DIAGNOSIS — L089 Local infection of the skin and subcutaneous tissue, unspecified: Secondary | ICD-10-CM | POA: Diagnosis not present

## 2022-01-14 LAB — RAD ONC ARIA SESSION SUMMARY
Course Elapsed Days: 30
Plan Fractions Treated to Date: 22
Plan Prescribed Dose Per Fraction: 2 Gy
Plan Total Fractions Prescribed: 30
Plan Total Prescribed Dose: 60 Gy
Reference Point Dosage Given to Date: 44 Gy
Reference Point Session Dosage Given: 2 Gy
Session Number: 22

## 2022-01-17 ENCOUNTER — Ambulatory Visit
Admission: RE | Admit: 2022-01-17 | Discharge: 2022-01-17 | Disposition: A | Discharge status: Home / Self Care | Payer: No Typology Code available for payment source | Source: Ambulatory Visit | Attending: Radiation Oncology | Admitting: Radiation Oncology

## 2022-01-17 ENCOUNTER — Other Ambulatory Visit: Payer: Self-pay

## 2022-01-17 DIAGNOSIS — L089 Local infection of the skin and subcutaneous tissue, unspecified: Secondary | ICD-10-CM | POA: Diagnosis not present

## 2022-01-17 LAB — RAD ONC ARIA SESSION SUMMARY
Course Elapsed Days: 33
Plan Fractions Treated to Date: 23
Plan Prescribed Dose Per Fraction: 2 Gy
Plan Total Fractions Prescribed: 30
Plan Total Prescribed Dose: 60 Gy
Reference Point Dosage Given to Date: 46 Gy
Reference Point Session Dosage Given: 2 Gy
Session Number: 23

## 2022-01-18 ENCOUNTER — Ambulatory Visit: Payer: No Typology Code available for payment source

## 2022-01-18 ENCOUNTER — Inpatient Hospital Stay: Payer: No Typology Code available for payment source | Admitting: Nutrition

## 2022-01-18 ENCOUNTER — Other Ambulatory Visit: Payer: Self-pay

## 2022-01-18 ENCOUNTER — Ambulatory Visit
Admission: RE | Admit: 2022-01-18 | Discharge: 2022-01-18 | Disposition: A | Discharge status: Home / Self Care | Payer: No Typology Code available for payment source | Source: Ambulatory Visit | Attending: Radiation Oncology | Admitting: Radiation Oncology

## 2022-01-18 DIAGNOSIS — L089 Local infection of the skin and subcutaneous tissue, unspecified: Secondary | ICD-10-CM | POA: Diagnosis not present

## 2022-01-18 LAB — RAD ONC ARIA SESSION SUMMARY
Course Elapsed Days: 34
Plan Fractions Treated to Date: 24
Plan Prescribed Dose Per Fraction: 2 Gy
Plan Total Fractions Prescribed: 30
Plan Total Prescribed Dose: 60 Gy
Reference Point Dosage Given to Date: 48 Gy
Reference Point Session Dosage Given: 2 Gy
Session Number: 24

## 2022-01-18 NOTE — Progress Notes (Signed)
Nutrition follow-up completed with patient after radiation therapy for SCC of the mandible.  Patient reports weight has improved and was documented as 163 pounds August 8.  This is improved from 154 pounds July 31.  There are no new labs.  Patient reports he is pleased with his weight gain.  He is now receiving and tolerating Nepro carb steady and Prosource TF at goal rate.    Patient denies nausea, vomiting, constipation, and diarrhea.  Reports he will be going home and would like his tube feeding regimen written out for him.  States he has all supplies needed including formula.  5 cartons of Nepro carb steady and Prosource TF twice daily along with 200 mL free water 6 times a day provides 2180 cal, 117 g protein, 2060 mL free water.  This is greater than 90% estimated nutrition needs.  Nutrition diagnosis: Moderate malnutrition, improving.  Intervention: Continue tube feeding and Prosource TF at goal rate to provide greater than 90% estimated needs. Provided written tube feeding regimen for patient to take today.  Monitoring, evaluation, goals: Patient will continue to tolerate tube feeding to goal rate to minimize weight loss.  Next visit: Monday, August 14 after radiation therapy.  **Disclaimer: This note was dictated with voice recognition software. Similar sounding words can inadvertently be transcribed and this note may contain transcription errors which may not have been corrected upon publication of note.**

## 2022-01-19 ENCOUNTER — Other Ambulatory Visit: Payer: Self-pay

## 2022-01-19 ENCOUNTER — Ambulatory Visit
Admission: RE | Admit: 2022-01-19 | Discharge: 2022-01-19 | Disposition: A | Discharge status: Home / Self Care | Payer: No Typology Code available for payment source | Source: Ambulatory Visit | Attending: Radiation Oncology | Admitting: Radiation Oncology

## 2022-01-19 DIAGNOSIS — L089 Local infection of the skin and subcutaneous tissue, unspecified: Secondary | ICD-10-CM | POA: Diagnosis not present

## 2022-01-19 LAB — RAD ONC ARIA SESSION SUMMARY
Course Elapsed Days: 35
Plan Fractions Treated to Date: 25
Plan Prescribed Dose Per Fraction: 2 Gy
Plan Total Fractions Prescribed: 30
Plan Total Prescribed Dose: 60 Gy
Reference Point Dosage Given to Date: 50 Gy
Reference Point Session Dosage Given: 2 Gy
Session Number: 25

## 2022-01-20 ENCOUNTER — Ambulatory Visit
Admission: RE | Admit: 2022-01-20 | Discharge: 2022-01-20 | Disposition: A | Discharge status: Home / Self Care | Payer: No Typology Code available for payment source | Source: Ambulatory Visit | Attending: Radiation Oncology | Admitting: Radiation Oncology

## 2022-01-20 ENCOUNTER — Other Ambulatory Visit: Payer: Self-pay

## 2022-01-20 DIAGNOSIS — L089 Local infection of the skin and subcutaneous tissue, unspecified: Secondary | ICD-10-CM | POA: Diagnosis not present

## 2022-01-20 LAB — RAD ONC ARIA SESSION SUMMARY
Course Elapsed Days: 36
Plan Fractions Treated to Date: 26
Plan Prescribed Dose Per Fraction: 2 Gy
Plan Total Fractions Prescribed: 30
Plan Total Prescribed Dose: 60 Gy
Reference Point Dosage Given to Date: 52 Gy
Reference Point Session Dosage Given: 2 Gy
Session Number: 26

## 2022-01-21 ENCOUNTER — Ambulatory Visit
Admission: RE | Admit: 2022-01-21 | Discharge: 2022-01-21 | Disposition: A | Discharge status: Home / Self Care | Payer: No Typology Code available for payment source | Source: Ambulatory Visit | Attending: Radiation Oncology | Admitting: Radiation Oncology

## 2022-01-21 ENCOUNTER — Other Ambulatory Visit: Payer: Self-pay

## 2022-01-21 DIAGNOSIS — L089 Local infection of the skin and subcutaneous tissue, unspecified: Secondary | ICD-10-CM | POA: Diagnosis not present

## 2022-01-21 LAB — RAD ONC ARIA SESSION SUMMARY
Course Elapsed Days: 37
Plan Fractions Treated to Date: 27
Plan Prescribed Dose Per Fraction: 2 Gy
Plan Total Fractions Prescribed: 30
Plan Total Prescribed Dose: 60 Gy
Reference Point Dosage Given to Date: 54 Gy
Reference Point Session Dosage Given: 2 Gy
Session Number: 27

## 2022-01-24 ENCOUNTER — Other Ambulatory Visit: Payer: Self-pay | Admitting: Radiation Oncology

## 2022-01-24 ENCOUNTER — Ambulatory Visit
Admission: RE | Admit: 2022-01-24 | Discharge: 2022-01-24 | Disposition: A | Discharge status: Home / Self Care | Payer: No Typology Code available for payment source | Source: Ambulatory Visit | Attending: Radiation Oncology | Admitting: Radiation Oncology

## 2022-01-24 ENCOUNTER — Encounter: Payer: Self-pay | Admitting: Nutrition

## 2022-01-24 ENCOUNTER — Ambulatory Visit: Payer: No Typology Code available for payment source

## 2022-01-24 ENCOUNTER — Other Ambulatory Visit: Payer: Self-pay

## 2022-01-24 ENCOUNTER — Inpatient Hospital Stay: Payer: No Typology Code available for payment source | Admitting: Nutrition

## 2022-01-24 DIAGNOSIS — C411 Malignant neoplasm of mandible: Secondary | ICD-10-CM

## 2022-01-24 DIAGNOSIS — L089 Local infection of the skin and subcutaneous tissue, unspecified: Secondary | ICD-10-CM | POA: Diagnosis not present

## 2022-01-24 LAB — RAD ONC ARIA SESSION SUMMARY
Course Elapsed Days: 40
Plan Fractions Treated to Date: 28
Plan Prescribed Dose Per Fraction: 2 Gy
Plan Total Fractions Prescribed: 30
Plan Total Prescribed Dose: 60 Gy
Reference Point Dosage Given to Date: 56 Gy
Reference Point Session Dosage Given: 2 Gy
Session Number: 28

## 2022-01-24 MED ORDER — SUCRALFATE 1 G PO TABS: ORAL_TABLET | ORAL | 4 refills | Status: DC

## 2022-01-24 NOTE — Progress Notes (Signed)
Patient did not want to stay for nutrition follow up because he was afraid to miss his ride.

## 2022-01-25 ENCOUNTER — Ambulatory Visit
Admission: RE | Admit: 2022-01-25 | Discharge: 2022-01-25 | Disposition: A | Discharge status: Home / Self Care | Payer: No Typology Code available for payment source | Source: Ambulatory Visit | Attending: Radiation Oncology | Admitting: Radiation Oncology

## 2022-01-25 ENCOUNTER — Ambulatory Visit: Payer: No Typology Code available for payment source

## 2022-01-25 ENCOUNTER — Other Ambulatory Visit: Payer: Self-pay

## 2022-01-25 DIAGNOSIS — L089 Local infection of the skin and subcutaneous tissue, unspecified: Secondary | ICD-10-CM | POA: Diagnosis not present

## 2022-01-25 LAB — RAD ONC ARIA SESSION SUMMARY
Course Elapsed Days: 41
Plan Fractions Treated to Date: 29
Plan Prescribed Dose Per Fraction: 2 Gy
Plan Total Fractions Prescribed: 30
Plan Total Prescribed Dose: 60 Gy
Reference Point Dosage Given to Date: 58 Gy
Reference Point Session Dosage Given: 2 Gy
Session Number: 29

## 2022-01-26 ENCOUNTER — Other Ambulatory Visit: Payer: Self-pay

## 2022-01-26 ENCOUNTER — Encounter: Payer: Self-pay | Admitting: Radiation Oncology

## 2022-01-26 ENCOUNTER — Ambulatory Visit
Admission: RE | Admit: 2022-01-26 | Discharge: 2022-01-26 | Disposition: A | Discharge status: Home / Self Care | Payer: No Typology Code available for payment source | Source: Ambulatory Visit | Attending: Radiation Oncology | Admitting: Radiation Oncology

## 2022-01-26 DIAGNOSIS — L089 Local infection of the skin and subcutaneous tissue, unspecified: Secondary | ICD-10-CM | POA: Diagnosis not present

## 2022-01-26 LAB — RAD ONC ARIA SESSION SUMMARY
Course Elapsed Days: 42
Plan Fractions Treated to Date: 30
Plan Prescribed Dose Per Fraction: 2 Gy
Plan Total Fractions Prescribed: 30
Plan Total Prescribed Dose: 60 Gy
Reference Point Dosage Given to Date: 60 Gy
Reference Point Session Dosage Given: 2 Gy
Session Number: 30

## 2022-01-26 NOTE — Progress Notes (Signed)
Oncology Nurse Navigator Documentation   Met with Mr. Busenbark after final RT to offer support and to celebrate end of radiation treatment.   Provided verbal/written post-RT guidance: Importance of protecting treatment area from sun. Continuation of Sonafine application 2-3 times daily, application of antibiotic ointment to areas of raw skin; when supply of Sonafine exhausted transition to OTC lotion with vitamin E. I discussed progress made with [facility] to have him be seen by PT and SLP for Therapy as requested by Dr. Squire.  Provided/reviewed Epic calendar of upcoming appts. Explained my role as navigator will continue for several more months, encouraged him to call me with needs/concerns.    Jennifer Malmfelt RN, BSN, OCN Head & Neck Oncology Nurse Navigator [facility] at  Hospital Phone # [phone]  Fax # [phone]   

## 2022-02-10 NOTE — Progress Notes (Signed)
Mr. Haroon presents today for follow-up after completing radiation to his left mandible on 01/26/2022  Pain issues, if any: Reports on-going throat pain. States he's able to manage with viscous lidocaine and sucralfate  Using a feeding tube?: Yes--denies any issues or concerns.  Weight changes, if any:  Wt Readings from Last 3 Encounters:  01/07/22 152 lb (68.9 kg)  12/15/21 160 lb 15 oz (73 kg)  12/09/21 161 lb 2.5 oz (73.1 kg)   Swallowing issues, if any: Yes--continues get majority of nutrition via PEG. Able to tolerate small amounts of liquids, but frequently gets choked so he doesn't try much by mouth Smoking or chewing tobacco? None Using fluoride trays daily? N/A Last ENT visit was on: Not since completing treatment. During first few weeks for radiation, patient saw Lolita Patella, PA-C on 01/04/2022 "Plan:  Dr. Hendricks Limes was able to see Mr. Guitron. He feels the swelling is in the realm of normal.  Dr.Patwa explained that in 6 -12 months he can debulk the flap and do a procedure to improve his oral incompetence.  Continue with Nystatin suspension. I gave him syringes to use to so that his mouth gets coated.  He will follow up with Dr. Conley Canal upon completion of radiation treatment."   Patient reports he will see Dr. Hendricks Limes on 02/24/22  Other notable issues, if any: Reports continued irritation to the inside of the left side of his mouth and very back of his throat. Reports occasional pressure/discomfort to his left ear. States he does manual massage to his jaw and neck to help with stiffness and swelling. Overall reports he's doing well and is in good spirits

## 2022-02-11 ENCOUNTER — Ambulatory Visit
Admission: RE | Admit: 2022-02-11 | Discharge: 2022-02-11 | Disposition: A | Discharge status: Home / Self Care | Payer: No Typology Code available for payment source | Source: Ambulatory Visit | Attending: Radiation Oncology | Admitting: Radiation Oncology

## 2022-02-11 ENCOUNTER — Encounter: Payer: Self-pay | Admitting: Radiation Oncology

## 2022-02-11 ENCOUNTER — Other Ambulatory Visit: Payer: Self-pay

## 2022-02-11 DIAGNOSIS — I890 Lymphedema, not elsewhere classified: Secondary | ICD-10-CM | POA: Diagnosis not present

## 2022-02-11 DIAGNOSIS — Z7982 Long term (current) use of aspirin: Secondary | ICD-10-CM | POA: Diagnosis not present

## 2022-02-11 DIAGNOSIS — Z923 Personal history of irradiation: Secondary | ICD-10-CM | POA: Diagnosis not present

## 2022-02-11 DIAGNOSIS — Z7984 Long term (current) use of oral hypoglycemic drugs: Secondary | ICD-10-CM | POA: Diagnosis not present

## 2022-02-11 DIAGNOSIS — Z79899 Other long term (current) drug therapy: Secondary | ICD-10-CM | POA: Insufficient documentation

## 2022-02-11 DIAGNOSIS — C411 Malignant neoplasm of mandible: Secondary | ICD-10-CM | POA: Insufficient documentation

## 2022-02-11 NOTE — Progress Notes (Signed)
Oncology Nurse Navigator Documentation   I met with Mr. Witherington before and during his follow up appointment with Dr. Isidore Moos today. He is recovering well from his radiation treatments. His skin irritation has healed, it is dry, intact, with a scab present. He is using Radiaplex and neosporin to these areas without difficulty. He is doing well living at home alone. He is managing his feeding tube without difficulty. While he was here I called and got him scheduled with PT and SLP as referred by [facility]. He was given an after visit summary with these appointment date, times, and address of each office. He's asked me to contact him by email if possible which I agreed to but he also has my direct contact information.   Harlow Asa RN, BSN, OCN Head & Neck Oncology Nurse [place] at [facility] Phone # [phone]  Fax # [phone]

## 2022-02-11 NOTE — Progress Notes (Signed)
Radiation Oncology         [phone]) [phone] ________________________________  Name: John Jacobson Sr. MRN: [id number]  Date: 02/11/2022  DOB: 1956-05-05  Follow-Up Visit Note  CC: Inc, John  Jacobson, John Jacobson, John  Diagnosis and Prior Radiotherapy:       ICD-10-CM   1. 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 Cancer of mandible [facility])  C41.1 CT Chest Wo Contrast    CT Soft Tissue Neck Wo Contrast     Cancer Staging  Cancer of mandible [facility]) Staging form: Oral Cavity, AJCC 8th Edition - Pathologic stage from 11/30/2021: Stage IVA (pT4a, pN0, cM0) - Signed by Eppie Gibson, MD on 11/30/2021 Stage prefix: Initial diagnosis  CHIEF COMPLAINT:  Here for follow-up and surveillance of jaw cancer  Narrative:  The patient returns today for routine follow-up.  John Jacobson presents today for follow-up after completing radiation to his left mandible on 01/26/2022  Pain issues, if any: Reports on-going throat pain. States he's able to manage with viscous lidocaine and sucralfate  Using a feeding tube?: Yes--denies any issues or concerns.  Weight changes, if any:  Wt Readings from Last 3 Encounters:  01/07/22 152 lb (68.9 kg)  12/15/21 160 lb 15 oz (73 kg)  12/09/21 161 lb 2.5 oz (73.1 kg)   Swallowing issues, if any: Yes--continues get majority of nutrition via PEG. Able to tolerate small amounts of liquids, but frequently gets choked so he doesn't try much by mouth Smoking or chewing tobacco? None Using fluoride trays daily? N/A Last ENT visit was on: Not since completing treatment. During first few weeks for radiation, patient saw Lolita Patella, PA-C on 01/04/2022 "Plan:  Dr. Hendricks Limes was able to see Mr. John Jacobson. He feels the swelling is in the realm of normal.  Dr.Patwa explained that in 6 -12 months he can debulk the flap and do a procedure to improve his oral incompetence.  Continue with Nystatin suspension. I gave him syringes to use to  so that his mouth gets coated.  He will follow up with Dr. Conley Canal upon completion of radiation treatment."   Patient reports he will see Dr. Hendricks Limes on 02/24/22  Other notable issues, if any: Reports continued irritation to the inside of the left side of his mouth and very back of his throat. Reports occasional pressure/discomfort to his left ear. States he does manual massage to his jaw and neck to help with stiffness and swelling. Overall reports he's doing well and is in good spirits                     ALLERGIES:  is allergic to amlodipine besy-benazepril hcl and shellfish allergy.  Meds: Current Outpatient Medications  Medication Sig Dispense Refill   LINAGLIPTIN PO Take 1 tablet by mouth daily.     SPIRONOLACTONE PO Take 1 tablet by mouth daily.     acetaminophen (TYLENOL) 500 MG tablet Place 1,000 mg into feeding tube 2 (two) times daily as needed for mild pain.     Amino Acids-Protein Hydrolys (FEEDING SUPPLEMENT, PRO-STAT SUGAR FREE 64,) LIQD Take 30 mLs by mouth 2 (two) times daily.     aspirin 81 MG EC tablet Take 81 mg by mouth daily. Per tube     atorvastatin (LIPITOR) 40 MG tablet Place 40 mg into feeding tube daily.     B Complex-C-Folic Acid (NEPHRO VITAMINS PO) Place 237 mLs into feeding tube See admin  instructions. Five times a day     carvedilol (COREG) 25 MG tablet Take 25 mg by mouth every 12 (twelve) hours.     chlorhexidine (PERIDEX) 0.12 % solution Use as directed 15 mLs in the mouth or throat 3 (three) times daily.     ferrous sulfate 220 (44 Fe) MG/5ML solution Place 300 mg into feeding tube daily.     gabapentin (NEURONTIN) 250 MG/5ML solution Place 300 mg into feeding tube at bedtime.     Glucose (RA TRUEPLUS GLUCOSE) 15 GM/32ML GEL Give 15 g by tube daily as needed (low blood sugar). Low Glucose     hydrALAZINE (APRESOLINE) 100 MG tablet Take 1 tablet (100 mg total) by mouth 3 (three) times daily. (Patient taking differently: Place 100 mg into feeding tube 3  (three) times daily.) 90 tablet 1   isosorbide dinitrate (ISORDIL) 20 MG tablet Place 1.5 tablets (30 mg total) into feeding tube 3 (three) times daily.     Lactobacillus-Inulin (CULTURELLE ADULT ULT BALANCE PO) 1 capsule by PEG Tube route daily.     lidocaine (XYLOCAINE) 2 % solution Patient: Mix 1part 2% viscous lidocaine, 1part H20. Coat mouth with and swallow 31m of diluted mixture, 324m before meals and at bedtime, up to QID 200 mL 3   melatonin 3 MG TABS tablet Place 6 mg into feeding tube at bedtime as needed for sleep.     Nutritional Supplements (FEEDING SUPPLEMENT, NEPRO CARB STEADY,) LIQD 237 mLs by Per J Tube route 5 (five) times daily.     nystatin (MYCOSTATIN) 100000 UNIT/ML suspension Take 5 mLs by mouth 3 (three) times daily.     oxyCODONE (OXY IR/ROXICODONE) 5 MG immediate release tablet Place 5 mg into feeding tube every 6 (six) hours as needed for severe pain.     pantoprazole sodium (PROTONIX) 40 mg/20 mL SUSP Place 40 mg into feeding tube daily.     Pollen Extracts (PROSTAT PO) Place 30 mLs into feeding tube 2 (two) times daily. Mix 30 ml with 4 oz of water     polyethylene glycol (MIRALAX / GLYCOLAX) 17 g packet Take 17 g by mouth daily as needed for moderate constipation. 14 each    senna-docusate (SENOKOT-S) 8.6-50 MG tablet Place 2 tablets into feeding tube at bedtime as needed for mild constipation.     sodium fluoride (DENTAGEL) 1.1 % GEL dental gel Place 1 Application onto teeth at bedtime.     sucralfate (CARAFATE) 1 g tablet Dissolve 1 tablet in 10 mL H20 and swallow up to QID prn soreness. 60 tablet 4   urea (CARMOL) 40 % CREA Apply 1 Application topically at bedtime. To thick scaly areas of skin     Water For Irrigation, Sterile (FREE WATER) SOLN Place 200 mLs into feeding tube every 4 (four) hours.     No current facility-administered medications for this encounter.    Physical Findings: The patient is in no acute distress. Patient is alert and oriented. Wt  Readings from Last 3 Encounters:  01/07/22 152 lb (68.9 kg)  12/15/21 160 lb 15 oz (73 kg)  12/09/21 161 lb 2.5 oz (73.1 kg)    vitals were not taken for this visit. .  General: Alert and oriented, in no acute distress HEENT: Head is normocephalic. Extraocular movements are intact.  Mouth / oropharynx is notable for thick secretions.  Resolving mucositis.  No sign of local recurrence. Neck: Neck is notable for no palpable masses.  Lymphedema in the left face and upper  neck. Skin: Skin in treatment fields shows satisfactory healing with resolving dry desquamation and resolving pigment changes Psychiatric: Judgment and insight are intact. Affect is appropriate.   Lab Findings: Lab Results  Component Value Date   WBC 14.1 (H) 12/21/2021   HGB 8.4 (L) 12/21/2021   HCT 26.5 (L) 12/21/2021   MCV 104.7 (H) 12/21/2021   PLT 200 12/21/2021    Lab Results  Component Value Date   TSH 1.590 12/03/2021   CMP     Component Value Date/Time   NA 141 12/21/2021 0610   K 3.7 12/21/2021 0610   CL 114 (H) 12/21/2021 0610   CO2 22 12/21/2021 0610   GLUCOSE 102 (H) 12/21/2021 0610   BUN 66 (H) 12/21/2021 0610   CREATININE 2.60 (H) 12/21/2021 0610   CALCIUM 9.0 12/21/2021 0610   PROT 7.6 12/15/2021 1826   ALBUMIN 2.7 (L) 12/21/2021 0610   AST 25 12/15/2021 1826   ALT 27 12/15/2021 1826   ALKPHOS 88 12/15/2021 1826   BILITOT 0.4 12/15/2021 1826   GFRNONAA 26 (L) 12/21/2021 0610   GFRAA 14 (L) 02/12/2018 0313     Radiographic Findings: No results found.  Impression/Plan:    1) Head and Neck Cancer Status: Healing from adjuvant radiation therapy  2) Nutritional Status: Pushing nutrition as tolerated and encouraged to maintain weight to help with healing   PEG tube: Intact  3) Risk Factors: The patient has been educated about risk factors including alcohol and tobacco abuse; they understand that avoidance of alcohol and tobacco is important to prevent recurrences as well as other  cancers  4) Speech and swallowing therapy // LYMPHEDEMA PT: We have spent hours trying to connect him with PT and swallowing therapy.  [facility] has approved this referral but the patient has trouble talking on the phone and therefore has not spoken with scheduling.  Anderson Malta, our navigator, scheduled his appointments today on her own and I printed out a calendar so he knows where to go and when  5) Dental: Encouraged to continue regular followup with dentistry, and dental hygiene including fluoride rinses.   6) Thyroid function: Check annually Lab Results  Component Value Date   TSH 1.590 12/03/2021    7) Follow-up in approximately 2 months with restaging CT of neck and chest without contrast at that time.  Patient cannot receive contrast due to kidney function.   On date of service, in total, I spent 30 minutes on this encounter. Patient was seen in person. _____________________________________   Eppie Gibson, MD

## 2022-02-11 NOTE — Addendum Note (Signed)
Encounter addended by: Eppie Gibson, MD on: 02/11/2022 5:39 PM  Actions taken: Medication List reviewed, Problem List reviewed, Allergies reviewed, Clinical Note Signed, Level of Service modified

## 2022-02-15 NOTE — Progress Notes (Signed)
                                                                                                                                                             Patient Name: John Jacobson MRN: [id number] DOB: September 04, 1955 Referring Physician:  [place] Date of Service: 01/26/2022 [facility], [place]                                                        End Of Treatment Note  Diagnoses: C41.1-Malignant neoplasm of mandible  Cancer Staging: ***  Intent: Curative  Radiation Treatment Dates: 12/15/2021 through 01/26/2022 Site Technique Total Dose (Gy) Dose per Fx (Gy) Completed Fx Beam Energies  Mandible: HN_mandibl IMRT 60/60 2 30/30 6X   Narrative: The patient tolerated radiation therapy relatively well. ***  Plan: The patient will follow-up with radiation oncology in *** . -----------------------------------  Eppie Gibson, MD

## 2022-02-16 ENCOUNTER — Other Ambulatory Visit: Payer: Self-pay | Admitting: Family Medicine

## 2022-02-16 ENCOUNTER — Ambulatory Visit
Admission: RE | Admit: 2022-02-16 | Discharge: 2022-02-16 | Disposition: A | Discharge status: Home / Self Care | Payer: Medicare (Managed Care) | Source: Ambulatory Visit | Attending: Family Medicine | Admitting: Family Medicine

## 2022-02-16 DIAGNOSIS — R0689 Other abnormalities of breathing: Secondary | ICD-10-CM

## 2022-02-23 ENCOUNTER — Ambulatory Visit: Payer: Non-veteran care | Admitting: Physical Therapy

## 2022-03-01 ENCOUNTER — Ambulatory Visit: Payer: Non-veteran care

## 2022-03-01 ENCOUNTER — Other Ambulatory Visit: Payer: Self-pay | Admitting: Vascular Surgery

## 2022-03-01 ENCOUNTER — Other Ambulatory Visit: Payer: Self-pay | Admitting: Family Medicine

## 2022-03-01 DIAGNOSIS — C069 Malignant neoplasm of mouth, unspecified: Secondary | ICD-10-CM

## 2022-03-01 NOTE — Progress Notes (Signed)
[facility] Note  03/02/2022     CHIEF COMPLAINT Patient presents for Retina Follow Up  HISTORY OF PRESENT ILLNESS: John A Lovel Suazo. is a 66 y.o. male who presents to the clinic today for:   HPI     Retina Follow Up   Patient presents with  Diabetic Retinopathy.  In both eyes.  This started years ago.  Severity is moderate.  Duration of 7 weeks.  Since onset it is stable.  I, the attending physician,  performed the HPI with the patient and updated documentation appropriately.        Comments   Patient feels that the vision is the same. He is complaining of soreness in his eyes at times. He sugar was 138 and his A1C is 6.8.      Last edited by Bernarda Caffey, MD on 03/05/2022 12:52 AM.    Pt had reconstruction sx on his jaw since he was here last, the surgeon told him everything looks good, but he wants him to see his cancer dr again, she states he is having trouble with numbness and shakiness in his arms where they cut his nerves  Referring physician: No referring provider defined for this encounter.  HISTORICAL INFORMATION:   Selected notes from the MEDICAL RECORD NUMBER Referred by Dr. Dorian Pod [facility]) for DM exam   CURRENT MEDICATIONS: No current outpatient medications on file. (Ophthalmic Drugs)   No current facility-administered medications for this visit. (Ophthalmic Drugs)   Current Outpatient Medications (Other)  Medication Sig   acetaminophen (TYLENOL) 500 MG tablet Place 1,000 mg into feeding tube 2 (two) times daily as needed for mild pain.   Amino Acids-Protein Hydrolys (FEEDING SUPPLEMENT, PRO-STAT SUGAR FREE 64,) LIQD Take 30 mLs by mouth 2 (two) times daily.   aspirin 81 MG EC tablet Take 81 mg by mouth daily. Per tube   atorvastatin (LIPITOR) 40 MG tablet Place 40 mg into feeding tube daily.   B Complex-C-Folic Acid (NEPHRO VITAMINS PO) Place 237 mLs into feeding tube See admin instructions. Five times a day    carvedilol (COREG) 25 MG tablet Take 25 mg by mouth every 12 (twelve) hours.   chlorhexidine (PERIDEX) 0.12 % solution Use as directed 15 mLs in the mouth or throat 3 (three) times daily.   ferrous sulfate 220 (44 Fe) MG/5ML solution Place 300 mg into feeding tube daily.   gabapentin (NEURONTIN) 250 MG/5ML solution Place 300 mg into feeding tube at bedtime.   Glucose (RA TRUEPLUS GLUCOSE) 15 GM/32ML GEL Give 15 g by tube daily as needed (low blood sugar). Low Glucose   hydrALAZINE (APRESOLINE) 100 MG tablet Take 1 tablet (100 mg total) by mouth 3 (three) times daily. (Patient taking differently: Place 100 mg into feeding tube 3 (three) times daily.)   isosorbide dinitrate (ISORDIL) 20 MG tablet Place 1.5 tablets (30 mg total) into feeding tube 3 (three) times daily.   Lactobacillus-Inulin (CULTURELLE ADULT ULT BALANCE PO) 1 capsule by PEG Tube route daily.   lidocaine (XYLOCAINE) 2 % solution Patient: Mix 1part 2% viscous lidocaine, 1part H20. Coat mouth with and swallow 47m of diluted mixture, 328m before meals and at bedtime, up to QID   LINAGLIPTIN PO Take 1 tablet by mouth daily.   melatonin 3 MG TABS tablet Place 6 mg into feeding tube at bedtime as needed for sleep.   Nutritional Supplements (FEEDING SUPPLEMENT, NEPRO CARB STEADY,) LIQD 237 mLs by Per J Tube  route 5 (five) times daily.   nystatin (MYCOSTATIN) 100000 UNIT/ML suspension Take 5 mLs by mouth 3 (three) times daily.   oxyCODONE (OXY IR/ROXICODONE) 5 MG immediate release tablet Place 5 mg into feeding tube every 6 (six) hours as needed for severe pain.   pantoprazole sodium (PROTONIX) 40 mg/20 mL SUSP Place 40 mg into feeding tube daily.   Pollen Extracts (PROSTAT PO) Place 30 mLs into feeding tube 2 (two) times daily. Mix 30 ml with 4 oz of water   polyethylene glycol (MIRALAX / GLYCOLAX) 17 g packet Take 17 g by mouth daily as needed for moderate constipation.   senna-docusate (SENOKOT-S) 8.6-50 MG tablet Place 2 tablets into  feeding tube at bedtime as needed for mild constipation.   sodium fluoride (DENTAGEL) 1.1 % GEL dental gel Place 1 Application onto teeth at bedtime.   SPIRONOLACTONE PO Take 1 tablet by mouth daily.   sucralfate (CARAFATE) 1 g tablet Dissolve 1 tablet in 10 mL H20 and swallow up to QID prn soreness.   urea (CARMOL) 40 % CREA Apply 1 Application topically at bedtime. To thick scaly areas of skin   Water For Irrigation, Sterile (FREE WATER) SOLN Place 200 mLs into feeding tube every 4 (four) hours.   No current facility-administered medications for this visit. (Other)   REVIEW OF SYSTEMS: ROS   Positive for: Neurological, Genitourinary, Endocrine, Cardiovascular, Eyes Negative for: Constitutional, Gastrointestinal, Skin, Musculoskeletal, HENT, Respiratory, Psychiatric, Allergic/Imm, Heme/Lymph Last edited by Annie Paras, COT on 03/02/2022  1:03 PM.     ALLERGIES Allergies  Allergen Reactions   Amlodipine Besy-Benazepril Hcl Anaphylaxis, Shortness Of Breath and Swelling    Mouth and tongue swelling   Shellfish Allergy Anaphylaxis, Shortness Of Breath and Swelling   PAST MEDICAL HISTORY Past Medical History:  Diagnosis Date   Anemia    Asthma    Chronic kidney disease    Chronic lower back pain 1995   Lumbar back surgery   Diabetic retinopathy ([place])    PDR OU   DM (diabetes mellitus) type II controlled with renal manifestation ([place])    CKD-4, peripheral neuropathy, PAD   Erectile dysfunction due to diabetes mellitus ([place])    Hepatitis C    Hyperlipidemia associated with type 2 diabetes mellitus ([place])    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  Normal EF 60 to 65%.?  GR 1 DD.  Mild LA dilation.;  CKD IV  - Cr 3.2   Hypertensive retinopathy    OU   RBBB (right bundle branch block)    Resistant hypertension    Managed by Dr. Posey Pronto from nephrology  [facility]   Secondary hyperaldosteronism [facility])    Stroke ([place]) 10/2016   Has residual ataxia and partial left-sided hemiparesis   Past Surgical History:  Procedure Laterality Date   BACK SURGERY  1995   CATARACT EXTRACTION Bilateral    EXCISION MASS NECK N/A 08/06/2021   Procedure: EXCISION OF MANDIBULAR MASS;  Surgeon: Izora Gala, MD;  Location: [facility];  Service: ENT;  Laterality: N/A;   EYE SURGERY Bilateral    Cat Sx   PARS PLANA VITRECTOMY Left 01/25/2017   TOE AMPUTATION Right 2016   Right second toe; dry gangrene   TRANSTHORACIC ECHOCARDIOGRAM  10/2016   TTE May 2018: Normal LV size and severe LVH-without LVOT gradient/S.A.M.  Normal EF 60 to 65%.?  GR 1 DD.  Mild LA dilation.   FAMILY HISTORY Family History  Problem Relation Age of Onset   Coronary artery disease Mother        MI in her 77s   Diabetes Mother    Macular degeneration Mother    Hypertension Other    Diabetes Other    Alzheimer's disease Other    Coronary artery disease Brother    Diabetes Brother    Diabetes Sister    SOCIAL HISTORY Social History   Tobacco Use   Smoking status: Former    Packs/day: 0.25    Types: Cigarettes    Quit date: 01/07/2018    Years since quitting: 4.1   Smokeless tobacco: Never  Vaping Use   Vaping Use: Never used  Substance Use Topics   Alcohol use: No    Comment: Few beers every other day hx   Drug use: No       OPHTHALMIC EXAM:  Base Eye Exam     Visual Acuity (Snellen - Linear)       Right Left   Dist [place] 20/50 20/20   Dist ph [place] NI          Tonometry (Tonopen, 1:06 PM)       Right Left   Pressure 16 14         Pupils       Dark Light Shape React APD   Right 3 2 Round Brisk None   Left 3 2 Round Brisk None         Visual Fields       Left Right    Full Full         Extraocular Movement       Right Left    Full, Ortho Full, Ortho         Neuro/Psych     Oriented x3: Yes   Mood/Affect: Normal          Dilation     Both eyes: 1.0% Mydriacyl, 2.5% Phenylephrine @ 1:03 PM           Slit Lamp and Fundus Exam     Slit Lamp Exam       Right Left   Lids/Lashes Dermatochalasis - upper lid, mild Meibomian gland dysfunction Dermatochalasis - upper lid, mild Meibomian gland dysfunction   Conjunctiva/Sclera nasal Pinguecula, Melanosis nasal/temporal Pinguecula, Melanosis   Cornea Trace Punctate epithelial erosions, well healed temporal cataract wounds, mild Debris in tear film 3+ fine Punctate epithelial erosions, irregular tear film, well healed cataract wounds, arcus   Anterior Chamber deep, narrow temporal angle, no cell or flare deep, narrow temporal angle; no cell/flare   Iris Round and poorly dilated to 5.43m, No NVI Round and poorly dilated to 5.243m No NVI   Lens PC IOL in good position PC IOL in good position, mild PC folds   Anterior Vitreous Vitreous syneresis, +condensations greatest inferiorly, old, white VH post vitrectomy         Fundus Exam       Right Left   Disc mild pallor, sharp rim mild pallor, sharp rim   C/D Ratio 0.5 0.5   Macula good foveal reflex, scattered MA/DBH, +Epiretinal membrane, trace cystic changes; scattered exudates - improving; mild scattered fibrosis Flat, good foveal reflex, scattered Microaneurysms, temporal macula very ischemic, persistent edema/cystic changes -- improved   Vessels attenuated, Tortuous attenuated, Tortuous   Periphery Attached, 360 PRP, good posterior laser fill in, scattered DBH Attached, good 360 PRP in  place, scattered [place] and Procedures for '@TODAY'$ @  OCT, Retina - OU - Both Eyes       Right Eye Quality was good. Central Foveal Thickness: 233. Progression has been stable. Findings include normal foveal contour, no SRF, intraretinal hyper-reflective material, epiretinal membrane, intraretinal fluid, macular pucker, outer retinal atrophy (Trace cystic changes temporal macula,  stable improvement vitreous opacities).   Left Eye Quality was good. Central Foveal Thickness: 241. Progression has improved. Findings include no SRF, abnormal foveal contour, epiretinal membrane, intraretinal fluid, inner retinal atrophy, outer retinal atrophy (Mild interval improvement in IRF temporal macula ).   Notes *Images captured and stored on drive  Diagnosis / Impression:  OU: ERM, diffuse atrophy, +DME OD: Trace cystic changes temporal macula, stable improvement vitreous opacities OS: Mild interval improvement in IRF temporal macula   Clinical management:  See below  Abbreviations: NFP - Normal foveal profile. CME - cystoid macular edema. PED - pigment epithelial detachment. IRF - intraretinal fluid. SRF - subretinal fluid. EZ - ellipsoid zone. ERM - epiretinal membrane. ORA - outer retinal atrophy. ORT - outer retinal tubulation. SRHM - subretinal hyper-reflective material            ASSESSMENT/PLAN:    ICD-10-CM   1. Proliferative diabetic retinopathy of both eyes with macular edema associated with type 2 diabetes mellitus ([place])  E11.3513 OCT, Retina - OU - Both Eyes    2. Epiretinal membrane (ERM) of both eyes  H35.373     3. Essential hypertension  I10     4. Hypertensive retinopathy of both eyes  H35.033     5. Pseudophakia, both eyes  Z96.1     6. Dry eyes  H04.123      1. Proliferative diabetic retinopathy w/ DME, OU  - lost to f/u from 12.19.22 to 08.02.23 (9 mos) due to oral cancer ([facility])  - lost to f/u from 4.11.22 to 9.26.22 -- 5+ mos, CHF exacerbations  - former pt of Dwana Melena at [facility] and Adonis Brook at [place] -- known history of proliferative diabetic retinopathy  - history of PPV OS and laser PRP OD with Dr. Manuella Ghazi for PDR OU w/ TRD OS -- 01/25/17  - history of intravitreal anti-VEGF therapy with Dr. Anderson Malta -- last IVE ~01/2018  - s/p PRP fill in OD (02.27.20) -- good fill in laser in place  - s/p IVA OU #1 (02.13.20), #2  (03.12.20), #3 (05.10.20), #4 (06.08.20), #5 (07.06.20), #6 (08.03.20), #7 (08.31.20), #8 (09.28.20), #9 (10.26.20)  - ?resistance to IVA  - s/p IVE OU #1 (11.23.20), #2 (01.22.21), #3 (03.05.21), #4 (04.02.21), #5 (04.30.21), #6 (06.07.21), #7 (07.19.21), #8 (08.25.21), #9 (10.06.21), #10 (11.10.21), #11 (12.20.21), #12 (02.14.22), #13 (04.11.22), #14 (09.26.22)  - exam shows stable regression of fine NVD OD, PRP laser in place OU  - FA (08.31.20) shows retinal NV vastly improved OD; significant vascular perfusion defects and increased FAZ OU; late leaking MA OU  - OCT shows OD: Trace cystic changes temporal macula, stable improvement vitreous opacities; OS: Mild interval improvement in IRF temporal macula -- 1 year since last IVE  - BCVA stable at 20/50 OD, OS stable at 20/20 -- pt is monovision, OD: near, OS: distance  - recommend holding off on IVE OU today 09.20.23 -- eyes relatively stable  - [employer] informed consent form signed and scanned on 01.22.2021  - [id number] Benefits Investigation initiated 10.26.2020 --  approved as of 11.30.20  - [facility] has re authorized IVE injections  - f/u 8 weeks, DFE/OCT, possible injection(s) -- OD eye is near eye, check vision with near card  2. Epiretinal membrane, both eyes   - mild ERM OU  - asymptomatic, no metamorphopsia  - no indication for surgery at this time  - monitor for now  3,4. Hypertensive retinopathy OU  - discussed importance of tight BP control  - monitor  5. Pseudophakia OU  - s/p CE/IOL OU with expert surgeon, Dr. Kathlen Mody (OD: 2.25.21, OS: 03.24.21)  - beautiful surgeries, doing well  - post op drops per Dr. Kathlen Mody  - monitor  6. Dry eyes OU  - improving  - recommend artificial tears and lubricating ointment as needed  Ophthalmic Meds Ordered this visit:  No orders of the defined types were placed in this encounter.    Return in about 8 weeks (around 04/27/2022) for f/u PDR OU, DFE, OCT.  There are no Patient Instructions on  file for this visit.  This document serves as a record of services personally performed by Gardiner Sleeper, MD, PhD. It was created on their behalf by Orvan Falconer, an ophthalmic technician. The creation of this record is the provider's dictation and/or activities during the visit.    Electronically signed by: Orvan Falconer, OA, 03/05/22  12:57 AM  This document serves as a record of services personally performed by Gardiner Sleeper, MD, PhD. It was created on their behalf by San Jetty. Owens Shark, OA an ophthalmic technician. The creation of this record is the provider's dictation and/or activities during the visit.    Electronically signed by: San Jetty. Owens Shark, [place] 09.20.2023 12:57 AM   Gardiner Sleeper, M.D., Ph.D. Diseases & Surgery of the Retina and Vitreous [facility]  I have reviewed the above documentation for accuracy and completeness, and I agree with the above. Gardiner Sleeper, M.D., Ph.D. 03/05/22 12:59 AM   Abbreviations: M myopia (nearsighted); A astigmatism; H hyperopia (farsighted); P presbyopia; Mrx spectacle prescription;  CTL contact lenses; OD right eye; OS left eye; OU both eyes  XT exotropia; ET esotropia; PEK punctate epithelial keratitis; PEE punctate epithelial erosions; DES dry eye syndrome; MGD meibomian gland dysfunction; ATs artificial tears; PFAT's preservative free artificial tears; [place] nuclear sclerotic cataract; PSC posterior subcapsular cataract; ERM epi-retinal membrane; PVD posterior vitreous detachment; RD retinal detachment; DM diabetes mellitus; DR diabetic retinopathy; NPDR non-proliferative diabetic retinopathy; PDR proliferative diabetic retinopathy; CSME clinically significant macular edema; DME diabetic macular edema; dbh dot blot hemorrhages; CWS cotton wool spot; POAG primary open angle glaucoma; C/D cup-to-disc ratio; HVF humphrey visual field; GVF goldmann visual field; OCT optical coherence tomography; IOP intraocular pressure;  BRVO Branch retinal vein occlusion; CRVO central retinal vein occlusion; CRAO central retinal artery occlusion; BRAO branch retinal artery occlusion; RT retinal tear; SB scleral buckle; PPV pars plana vitrectomy; VH Vitreous hemorrhage; PRP panretinal laser photocoagulation; IVK intravitreal kenalog; VMT vitreomacular traction; MH Macular hole;  NVD neovascularization of the disc; NVE neovascularization elsewhere; AREDS age related eye disease study; ARMD age related macular degeneration; POAG primary open angle glaucoma; EBMD epithelial/anterior basement membrane dystrophy; ACIOL anterior chamber intraocular lens; IOL intraocular lens; PCIOL posterior chamber intraocular lens; Phaco/IOL phacoemulsification with intraocular lens placement; [place] photorefractive keratectomy; LASIK laser assisted in situ keratomileusis; HTN hypertension; DM diabetes mellitus; COPD chronic obstructive pulmonary disease

## 2022-03-02 ENCOUNTER — Ambulatory Visit (INDEPENDENT_AMBULATORY_CARE_PROVIDER_SITE_OTHER): Payer: Medicare (Managed Care) | Admitting: Ophthalmology

## 2022-03-02 DIAGNOSIS — H35033 Hypertensive retinopathy, bilateral: Secondary | ICD-10-CM | POA: Diagnosis not present

## 2022-03-02 DIAGNOSIS — H35373 Puckering of macula, bilateral: Secondary | ICD-10-CM

## 2022-03-02 DIAGNOSIS — I10 Essential (primary) hypertension: Secondary | ICD-10-CM | POA: Diagnosis not present

## 2022-03-02 DIAGNOSIS — E113513 Type 2 diabetes mellitus with proliferative diabetic retinopathy with macular edema, bilateral: Secondary | ICD-10-CM | POA: Diagnosis not present

## 2022-03-02 DIAGNOSIS — H04123 Dry eye syndrome of bilateral lacrimal glands: Secondary | ICD-10-CM

## 2022-03-02 DIAGNOSIS — Z961 Presence of intraocular lens: Secondary | ICD-10-CM

## 2022-03-05 ENCOUNTER — Encounter (INDEPENDENT_AMBULATORY_CARE_PROVIDER_SITE_OTHER): Payer: Self-pay | Admitting: Ophthalmology

## 2022-03-08 ENCOUNTER — Other Ambulatory Visit: Payer: Self-pay | Admitting: Vascular Surgery

## 2022-03-08 ENCOUNTER — Ambulatory Visit
Admission: RE | Admit: 2022-03-08 | Discharge: 2022-03-08 | Disposition: A | Discharge status: Home / Self Care | Payer: Non-veteran care | Source: Ambulatory Visit | Attending: Vascular Surgery | Admitting: Vascular Surgery

## 2022-03-08 DIAGNOSIS — C069 Malignant neoplasm of mouth, unspecified: Secondary | ICD-10-CM

## 2022-03-10 ENCOUNTER — Ambulatory Visit: Payer: No Typology Code available for payment source | Attending: Radiation Oncology

## 2022-03-10 DIAGNOSIS — R471 Dysarthria and anarthria: Secondary | ICD-10-CM | POA: Diagnosis present

## 2022-03-10 DIAGNOSIS — R1312 Dysphagia, oropharyngeal phase: Secondary | ICD-10-CM | POA: Diagnosis present

## 2022-03-11 ENCOUNTER — Other Ambulatory Visit: Payer: Self-pay

## 2022-03-11 NOTE — Therapy (Signed)
OUTPATIENT SPEECH LANGUAGE PATHOLOGY SWALLOW EVALUATION   Patient Name: John A Ballantine Sr. MRN: [id number] DOB:04/02/1956, 66 y.o., male Today's Date: 03/11/2022  PCP: No PCP on file REFERRING PROVIDER: Raynelle Fanning, NP   End of Session - 03/11/22 1106     Visit Number 1    Number of Visits 21    Date for SLP Re-Evaluation 05/19/22    Authorization Type [facility]    SLP Start Time 1535    SLP Stop Time  2542    SLP Time Calculation (min) 40 min    Activity Tolerance Patient tolerated treatment well             Past Medical History:  Diagnosis Date   Anemia    Asthma    Chronic kidney disease    Chronic lower back pain 1995   Lumbar back surgery   Diabetic retinopathy ([place])    PDR OU   DM (diabetes mellitus) type II controlled with renal manifestation ([place])    CKD-4, peripheral neuropathy, PAD   Erectile dysfunction due to diabetes mellitus ([place])    Hepatitis C    Hyperlipidemia associated with type 2 diabetes mellitus ([place])    Hypertensive heart disease with congestive heart failure and chronic kidney disease ([place])    TTE May 2018: Normal LV size and severe LVH-without LVOT gradient/S.A.M.  Normal EF 60 to 65%.?  GR 1 DD.  Mild LA dilation.;  CKD IV  - Cr 3.2   Hypertensive retinopathy    OU   RBBB (right bundle branch block)    Resistant hypertension    Managed by Dr. 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[facility])    Stroke ([place]) 10/2016   Has residual ataxia and partial left-sided hemiparesis   Past Surgical History:  Procedure Laterality Date   BACK SURGERY  1995   CATARACT EXTRACTION Bilateral    EXCISION MASS NECK N/A 08/06/2021   Procedure: EXCISION OF MANDIBULAR MASS;  Surgeon: Izora Gala, MD;  Location: [place];  Service: ENT;  Laterality: N/A;   EYE SURGERY Bilateral    Cat Sx   PARS PLANA VITRECTOMY Left 01/25/2017   TOE AMPUTATION Right 2016   Right second toe; dry gangrene   TRANSTHORACIC  ECHOCARDIOGRAM  10/2016   TTE May 2018: Normal LV size and severe LVH-without LVOT gradient/S.A.M.  Normal EF 60 to 65%.?  GR 1 DD.  Mild LA dilation.   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/30/2021   PAD (peripheral artery disease) ([place]) 08/17/2020   Stroke ([place]) 11/15/2016   History of stroke    Hepatitis C virus infection without hepatic coma    Brainstem infarct, acute (HCC)    Hypokalemia    Leukocytosis    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 ([place])    Cerebellar infarction ([place]) 11/04/2016   Hyperlipidemia due to type 2 diabetes mellitus (HCC)    Gait disturbance, post-stroke    Acute renal failure superimposed on stage 3b chronic kidney disease ([place]) 11/02/2016   Ataxia 11/02/2016  Ischemic stroke ([place]) 11/02/2016   Puncture wound of right foot 11/02/2016   Hypertensive heart and chronic kidney disease with heart failure and stage 1 through stage 4 chronic kidney disease, or chronic kidney disease ([place]) 11/02/2016   Chronic left-sided low back pain 11/02/2016   Demand ischemia ([place])    Chest pain with moderate risk for cardiac etiology 05/24/2011   Tobacco abuse 05/24/2011   DM (diabetes mellitus), type 2 with renal complications ([place]) 40/01/6760    ONSET DATE: early 2023   REFERRING DIAG: Malignant neoplasm of mouth, unspecified  THERAPY DIAG:  Dysphagia, oropharyngeal phase - Plan: SLP plan of care cert/re-cert  Dysarthria and anarthria - Plan: SLP plan of care cert/re-cert  Rationale for Evaluation and Treatment  Rehabilitation  SUBJECTIVE:   SUBJECTIVE STATEMENT: "I had pineapple juice, grapefruit juice, and water." Pt dx'd with aspiration PNA yesterday - beginning antibiotic regimen today. Pt accompanied by:  daughter [name]  PERTINENT HISTORY: 66yo male admitted from [facility] 12/15/21 with acute onset of worsening leukocytosis. 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 Recent hospitalization for bilateral Aspiration PNA (DC 12/09/21). Completed radiation course 01/26/22, and was d/c home from [facility] 01/18/22. FEES completed at [facility] 02/24/22.  PAIN:  Are you having pain? No  FALLS: Has patient fallen in last 6 months?  No  LIVING ENVIRONMENT: Lives with: lives alone Lives in: House/apartment  PLOF:  Level of assistance: Independent with ADLs Employment: Retired   PATIENT GOALS Eat safely  OBJECTIVE:   DIAGNOSTIC FINDINGS:  EXAM: CHEST - 2 VIEW COMPARISON:  02/16/2022. FINDINGS: The heart size and mediastinal contours are stable. Airspace disease is noted in the medial aspect of the right lower lobe. No effusion or pneumothorax. Surgical clips are present in the left axilla and cervical soft tissues. No acute osseous abnormality. IMPRESSION: Airspace disease in the medial aspect of the right lower lobe, concerning for pneumonia. Electronically Signed   By: Brett Fairy M.D.   On: 03/09/2022 03:20  RECOMMENDATIONS FROM OBJECTIVE SWALLOW STUDY (MBSS/FEES):   FEES [facility] 02-24-22 Procedure: A flexible endoscope was passed transnasally to obtain a superior view of the hypopharynx, larynx,and trachea. Test boluses were administered as indicated below. All boluses were tinged with food coloring for greater visualization and administered to assess swallowing physiology and  aspiration risk. The examination was recorded. Test Boluses: Bolus Given: Thin liquids, Puree Liquids Provided Via: Syringe, Straw (straw to the right posterior oral cavity) Findings: Secretion Rating: 1-Secretions observed in valleculae and/or pyriform sinuses (thick, clear with initial bolus presentations) Vocal Fold Adduction and Abduction: Complete Oral Phase: Premature spillage of the bolus over base of tongue, Prolonged oral preparatory time, Oral residue after the swallow Swallow Initiation Phase: Delayed Residue: Mild- < half the bolus remains, Moderate-half the bolus remains Laryngeal Penetration Occurred with: Thin liquid Laryngea Penetration was: Before the swallow, After the swallow Aspiration Occurred With: Thin liquid (x1) Esophageal Regurgitation into Hypopharynx: No Baseline:  Penetration-Aspiration Scale (PAS): Thin Liquid: 4, 6x1, Puree: 3 1 Material does not enter airway  2 Material enters the airway, remains above the vocal folds and is ejected from the airway  3 Material enters the airway, remains above the vocal folds and is not ejected from the airway  4 Material enters the airway, contacts the vocal folds and is ejected from the airway  5 Material enters the airway, contacts the vocal folds and is not ejected from the airway  6 Material enters the airway, passes below the vocal folds and is ejected into the larynx or out of the airway  7 Material enters the airway, passes below the vocal folds and is not ejected from the trachea despite effort  8 Material enters the airway, passes below the vocal folds and no effort is made to eject   Compensatory techniques attempted: Throat Clear/Cough: Effective Small Bites/Sips: Effective Impressions: Patient presents with moderate oropharyngeal dysphagia related to [facility] and associated treatment. Dysphagia is characterized by reduced labial seal, reduced oral control/coordination/clearance, reduced bolus propulsion and airway  protection with findings as noted above. Patient is able to compensate for oral deficits fairly well but utilizing a straw to the right of his oral cavity, keeping his head slightly back to aid posterior transport with oral cavity clearance as needed. Patient is a few weeks post-RT and ready to proceed with increasing oral intake. Discussed initiation of liquids, as well as small amounts of runny purees adhering to aspiration precautions outlined below. We will plan to see patient back in clinic in a few weeks in coordination with Dr. Conley Canal for continued swallow assessment and management. Patient and his daughter verbalized understanding of exam results and recommendations. Prognosis: Prognosis: presumed good with continued recovery Recommendations: Diet: initiate liquids, and small amounts of runny purees; G-tube for primary nutrition and medication Aspiration Precautions: sit upright, boluses to the posterior right of patient's oral cavity, head slight recline, slow rate, fully swallow bites/sips, check for oral residue Administer Medications: via alternate means Other recommendations: complete home program of lingual laryngeal and pharyngeal strengthening exercises Re-Evaluate: Re-Eval in: ~1 month, in coordination with return ENT appointment  COGNITION: Overall cognitive status: Within functional limits for tasks assessed  ORAL MOTOR EXAMINATION Overall status: Impaired: Labial: -slight decr ROM on rt; decr'd labial closure sue to structural/sx changes Lingual: Right (ROM and Coordination) - due to structural/surgical changes Mandible: Symmetry  Comments: MOTOR SPEECH - pt's articulatory precision is hindered due to structural limitations of ROM (mainly of lingual musculature) - a surgical revision is being considered to de-bulk lt floor of mouth to allow more space for lingual musculature.  CLINICAL SWALLOW ASSESSMENT:   Current diet: thin liquids Dentition: missing dentition Patient  directly observed with POs: No - due to no oral care pre-evaluation Feeding: able to feed self with adaptive devices (syringe)    PATIENT REPORTED OUTCOME MEASURES (PROM): EAT-10: provided next session   TODAY'S TREATMENT:  SLP discussed/explained aspiration PNA, and that since his dx of aspiration PNA SLP would like pt to use ice chips 3-4 times a day (at least) after thorough oral care at home, ensuring cleaning of pocket on lt that was ID'd at Dr. Hendricks Limes f/u on 02-24-22. He should have ice chips placing chip on posterior rt tongue and use effortful swallows with slight head recline. We will initiate runny puree with syringe and water with straw with during ST sessions until SLP confident pt can accomplish safely on his own, then he will initiate runny puree and thin trials at home.   PATIENT EDUCATION: Education details: see above in "Today's Treatment" Person educated: Patient and Child(ren) Education method: Explanation and Demonstration Education comprehension: verbalized understanding and needs further education   ASSESSMENT:  CLINICAL IMPRESSION: Patient is a 66 y.o. male who is PEG dependent, seen today for assessment of swallowing in light of FEES completed 02-24-22 and dx of aspiration  PNA yesterday. Pt did not perform oral care prior to today's evaluation so no POs were observed today. SLP told pt to complete ice chip boluses with posterior right lingual placement at home after thorough oral care x3-4/day going until he feels like swallowing muscles are tired. SLP will also assist pt with HEP provided from [facility] for inhibiting muscle disuse atrophy and alleviation of swallow muscle fibrosis post radiation. Pt will also cont to be seen by ST at [facility] for periodic assessment and for instrumental swallowing evaluations.  OBJECTIVE IMPAIRMENTS include dysarthria and dysphagia. These impairments are limiting patient from effectively communicating at home and in community and safety  when swallowing. Factors affecting potential to achieve goals and functional outcome are co-morbidities and severity of impairments. Patient will benefit from skilled SLP services to address above impairments and improve overall function.  REHAB POTENTIAL: Fair given severity of deficits and presence of aspiration PNA ID'd yesterday, following FEES at [facility] 02-24-22.   GOALS: Goals reviewed with patient? Yes  SHORT TERM GOALS: Target date: 04/15/2022    Pt will demo swallow precautions from FEES with runny purees and thin liquids with straw in 3 sessions Baseline: Goal status: INITIAL  2.  Pt will demo HEP for swallowing with rare min A over 2 sessions Baseline:  Goal status: INITIAL  3.  Pt will verbally ID 3 overt s/s aspiration PNA in 2 sessions Baseline:  Goal status: INITIAL   LONG TERM GOALS: Target date: 05/20/2022    Pt will demo HEP for swallowing with rare min A over 2 sessions Baseline:  Goal status: INITIAL  2.  Pt will demo swallow precautions from FEES with runny purees and thin liquids with straw in 3 sessions Baseline:  Goal status: INITIAL  3.  Pt score on EAT-10 will improve over the course of ST Baseline:  Goal status: INITIAL  4.  Pt will incr speech intelligibility to 95-100% in 10 minutes conversation with modified independence in 2 sessions Baseline:  Goal status: INITIAL  PLAN: SLP FREQUENCY: 2x/week  SLP DURATION: 10 weeks  PLANNED INTERVENTIONS: Aspiration precaution training, Pharyngeal strengthening exercises, Diet toleration management , Environmental controls, Trials of upgraded texture/liquids, Internal/external aids, Multimodal communication approach, SLP instruction and feedback, Compensatory strategies, and Patient/family education    [facility], [place] 03/11/2022, 11:07 AM

## 2022-03-11 NOTE — Patient Instructions (Signed)
   Within 15-20 minutes after thorough oral care with your oral rinse, give yourself ice chips until your swallow feels tired Place ice chips in the back right part of your tongue, tilt your head back slightly, and SWALLOW HARD Do this 3-4 times a day, at least!  Need to complete swallow exercises provided by [facility], after thorough oral care  Please bring syringe with you next time you come (45m is ok) and we will work with runny applesauce

## 2022-03-25 ENCOUNTER — Ambulatory Visit: Payer: Non-veteran care | Attending: Radiation Oncology

## 2022-03-28 ENCOUNTER — Other Ambulatory Visit: Payer: Non-veteran care

## 2022-03-28 ENCOUNTER — Inpatient Hospital Stay: Admission: RE | Admit: 2022-03-28 | Payer: Non-veteran care | Source: Ambulatory Visit

## 2022-03-30 ENCOUNTER — Telehealth: Payer: Self-pay | Admitting: Dietician

## 2022-03-30 NOTE — Telephone Encounter (Signed)
Attempted to return call from Glean Salen, RD at [facility] who reached out to B. Neff for questions/continuity of care concerns. [place] on VM [phone]).  April Manson, RDN, LDN Registered Dietitian, [place] Part Time Remote (Usual office hours: Tuesday-Thursday) Mobile: [phone] Remote Office: [phone]

## 2022-04-01 ENCOUNTER — Telehealth: Payer: Self-pay | Admitting: Dietician

## 2022-04-01 ENCOUNTER — Ambulatory Visit: Payer: Non-veteran care

## 2022-04-01 NOTE — Telephone Encounter (Signed)
Called patient at home phone at request of AW[facility] RD who reports his weight went from  160# 02/24/22 to 139# 03/23/22.    Patient states he is only taking 3 cartons Nepro daily because his stomach hurts when he takes feeds.  Tried to suggest smaller volume feeds.  He wanted me to speak with his daughter.    Esperanza Richters [phone] she gave me contact information for Clemens Catholic, NP [phone].   I suggested  to daughter that he trial 1/2 feeds today and through weekend until he can be seen at [facility] on Monday and set up with supplies for pump feeds.    I called  Clemens Catholic, NP who is his PCP she states she will contact RD at [place]  [place], [place]) and have her reach out to patient.  She works mostly remotely.  He is due to be seen at [facility] on Monday.   April Manson, RDN, LDN Registered Dietitian, [place] Part Time Remote (Usual office hours: Tuesday-Thursday) Mobile: [phone] Remote Office: [phone]

## 2022-04-10 ENCOUNTER — Encounter (HOSPITAL_COMMUNITY): Payer: Self-pay

## 2022-04-10 ENCOUNTER — Emergency Department (HOSPITAL_COMMUNITY)
Admission: EM | Admit: 2022-04-10 | Discharge: 2022-04-11 | Disposition: A | Discharge status: Home / Self Care | Payer: No Typology Code available for payment source | Attending: Emergency Medicine | Admitting: Emergency Medicine

## 2022-04-10 ENCOUNTER — Other Ambulatory Visit: Payer: Self-pay

## 2022-04-10 DIAGNOSIS — C411 Malignant neoplasm of mandible: Secondary | ICD-10-CM | POA: Insufficient documentation

## 2022-04-10 DIAGNOSIS — N186 End stage renal disease: Secondary | ICD-10-CM

## 2022-04-10 DIAGNOSIS — I120 Hypertensive chronic kidney disease with stage 5 chronic kidney disease or end stage renal disease: Secondary | ICD-10-CM | POA: Diagnosis not present

## 2022-04-10 DIAGNOSIS — Z79899 Other long term (current) drug therapy: Secondary | ICD-10-CM | POA: Diagnosis not present

## 2022-04-10 DIAGNOSIS — C4492 Squamous cell carcinoma of skin, unspecified: Secondary | ICD-10-CM

## 2022-04-10 DIAGNOSIS — D72829 Elevated white blood cell count, unspecified: Secondary | ICD-10-CM | POA: Insufficient documentation

## 2022-04-10 DIAGNOSIS — E1122 Type 2 diabetes mellitus with diabetic chronic kidney disease: Secondary | ICD-10-CM | POA: Insufficient documentation

## 2022-04-10 DIAGNOSIS — R220 Localized swelling, mass and lump, head: Secondary | ICD-10-CM | POA: Diagnosis present

## 2022-04-10 DIAGNOSIS — Z7982 Long term (current) use of aspirin: Secondary | ICD-10-CM | POA: Diagnosis not present

## 2022-04-10 LAB — CBC WITH DIFFERENTIAL/PLATELET
Abs Immature Granulocytes: 0.04 10*3/uL (ref 0.00–0.07)
Basophils Absolute: 0 10*3/uL (ref 0.0–0.1)
Basophils Relative: 0 %
Eosinophils Absolute: 0.5 10*3/uL (ref 0.0–0.5)
Eosinophils Relative: 4 %
HCT: 29.3 % — ABNORMAL LOW (ref 39.0–52.0)
Hemoglobin: 9.1 g/dL — ABNORMAL LOW (ref 13.0–17.0)
Immature Granulocytes: 0 %
Lymphocytes Relative: 6 %
Lymphs Abs: 0.8 10*3/uL (ref 0.7–4.0)
MCH: 30.5 pg (ref 26.0–34.0)
MCHC: 31.1 g/dL (ref 30.0–36.0)
MCV: 98.3 fL (ref 80.0–100.0)
Monocytes Absolute: 2.1 10*3/uL — ABNORMAL HIGH (ref 0.1–1.0)
Monocytes Relative: 16 %
Neutro Abs: 9.5 10*3/uL — ABNORMAL HIGH (ref 1.7–7.7)
Neutrophils Relative %: 74 %
Platelets: 164 10*3/uL (ref 150–400)
RBC: 2.98 MIL/uL — ABNORMAL LOW (ref 4.22–5.81)
RDW: 15.5 % (ref 11.5–15.5)
WBC: 13 10*3/uL — ABNORMAL HIGH (ref 4.0–10.5)
nRBC: 0 % (ref 0.0–0.2)

## 2022-04-10 NOTE — ED Provider Triage Note (Signed)
Emergency Medicine Provider Triage Evaluation Note  John A Imelda Pillow. , a 66 y.o. male  was evaluated in triage.  Pt complains of hypotension. Patient with systolic BPs in the 95'V at home which is unusual for him. Was taken off his hydralazine for hypotension, but BP continues to be low per daughter. He was recent started on Augmentin by his ENT at [facility]; concern for infection at site of jaw reconstruction (hx oral CA). Has some worsening jaw pain, no fevers. Also recently started dialysis, last session on Saturday  Review of Systems  Positive: As above Negative: As above  Physical Exam  BP 139/72 (BP Location: Right Arm)   Pulse 66   Temp 98.2 F (36.8 C) (Oral)   Resp 18   SpO2 98%  Gen:   Awake, no distress   Resp:  Normal effort  MSK:   Moves extremities without difficulty  Other:  Oral and lower jaw reconstructive surgery without obvious cellulitis  Medical Decision Making  Medically screening exam initiated at 11:17 PM.  Appropriate orders placed.  John A Joslyn Sr. was informed that the remainder of the evaluation will be completed by another provider, this initial triage assessment does not replace that evaluation, and the importance of remaining in the ED until their evaluation is complete.  Hypotension    Antonietta Breach, PA-C 04/10/22 2328

## 2022-04-10 NOTE — ED Triage Notes (Signed)
Complaining of low blood pressure for a couple of days, not feeling well, fatigued for the past few days. Has been seen for his mouth and was recently put on Augmentin for infection in the mouth

## 2022-04-11 ENCOUNTER — Emergency Department (HOSPITAL_COMMUNITY): Payer: No Typology Code available for payment source

## 2022-04-11 ENCOUNTER — Telehealth: Payer: Self-pay

## 2022-04-11 LAB — BASIC METABOLIC PANEL
Anion gap: 11 (ref 5–15)
BUN: 81 mg/dL — ABNORMAL HIGH (ref 8–23)
CO2: 27 mmol/L (ref 22–32)
Calcium: 8.9 mg/dL (ref 8.9–10.3)
Chloride: 98 mmol/L (ref 98–111)
Creatinine, Ser: 5.67 mg/dL — ABNORMAL HIGH (ref 0.61–1.24)
GFR, Estimated: 10 mL/min — ABNORMAL LOW (ref >60)
Glucose, Bld: 127 mg/dL — ABNORMAL HIGH (ref 70–99)
Potassium: 3.9 mmol/L (ref 3.5–5.1)
Sodium: 136 mmol/L (ref 135–145)

## 2022-04-11 LAB — LACTIC ACID, PLASMA
Lactic Acid, Venous: 0.9 mmol/L (ref 0.5–1.9)
Lactic Acid, Venous: 1 mmol/L (ref 0.5–1.9)

## 2022-04-11 NOTE — Telephone Encounter (Signed)
Pt daughter John Jacobson called concerning her dads overall condition. She stated his bp has been dropping with dialysis. Pt was recently seen [facility] (on Wednesday), due to mouth swelling and other concerns. An mri and pet scan were scheduled by the pa at [facility], then moved up to Nov. 1. Pt is now in the emergency department being seen due to health concerns. He is getting an mri and daughter wants him to get a pet scan while he is here. She would like Dr. Isidore Moos to order this and have it done while he is here versus having it done at [facility] will see how Dr. Isidore Moos would like to precede.

## 2022-04-11 NOTE — ED Notes (Signed)
Patient to MRI.

## 2022-04-11 NOTE — ED Provider Notes (Signed)
Accepted handoff at shift change from [facility]. Please see prior provider note for more detail.   Briefly: Patient is 66 y.o.   DDX: concern for 66yo new dialysis patient, started last Thursday. Has SCC of left mandible -- had huge plastics removal of left face and neck. New swelling -- started on ABX. Had MR and PET Scan scheduled but hadn't received yet. Not red or warm -- if MR doesn't show anything okay to DC, if questionable infectious, likely need to call ENT at [facility] to follow up with recommendations, failed abx x 5 days.  Plan: MR shows some postsurgical inflammatory changes without any evidence of new fluid collection or significant evidence for developing infection, or new cancer, patient has not had any hypotension throughout his protracted emergency department stay, and appears stable for discharge at this time, he is supposed to follow-up at [facility] for a PET scan in a few days which I think is reasonable.  We will discharge her previous provider plan.      RISR  EDTHIS    [place] 04/11/22 1134    Alvira Monday, MD 04/12/22 1035

## 2022-04-11 NOTE — ED Provider Notes (Signed)
[name]-EMERGENCY DEPT Provider Note   CSN: [id number] Arrival date & time: 04/10/22  2147    History  Chief Complaint  Patient presents with   Hypotension    John Jacobson. is a 66 y.o. 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  Daughter helps provide history.  Patient has felt fatigued over the last few days.  She took his blood pressure yesterday which is low, into the 80s systolic.  He was taken off his hydralazine due to hypotension.  Was recently seen by ENT at [facility] started on Augmentin for possible infection at site of dry reconstruction.  He has PET scan scheduled for 2 days as well as MRI of his face and neck in 1 week to assess for this. No improvement on Augmentin  He is n.p.o., takes everything through PEG.  Dialysis sessions have been going well however has not been able to have full session per family.  No fever, emesis, shortness of breath, chest pain, abdominal pain. Has some chronic pain at prior surgical site however increased from baseline.  HPI    Home Medications Prior to Admission medications   Medication Sig Start Date End Date Taking? Authorizing Provider  Nutritional Supplements (FEEDING SUPPLEMENT, NEPRO CARB STEADY,) LIQD 237 mLs by Per J Tube route 5 (five) times daily.   Yes [provider]  acetaminophen (TYLENOL) 500 MG tablet Place 1,000 mg into feeding tube 2 (two) times daily as needed for mild pain.    [provider]  Amino Acids-Protein Hydrolys (FEEDING SUPPLEMENT, PRO-STAT SUGAR FREE 64,) LIQD Take 30 mLs by mouth 2 (two) times daily.    [provider]  aspirin 81 MG EC tablet Take 81 mg by mouth daily. Per tube 05/20/21   [provider]  atorvastatin (LIPITOR) 40 MG tablet Place 40 mg into feeding tube daily. 05/20/21   [provider]  B Complex-C-Folic Acid (NEPHRO VITAMINS PO) Place 237 mLs into feeding tube See admin instructions. Five times a day    [provider]  carvedilol (COREG) 25 MG tablet Take 25 mg by mouth every 12 (twelve) hours.    [provider]  chlorhexidine (PERIDEX) 0.12 % solution Use as directed 15 mLs in the mouth or throat 3 (three) times daily.    [provider]  ferrous sulfate 220 (44 Fe) MG/5ML solution Place 300 mg into feeding tube daily.    [provider]  gabapentin (NEURONTIN) 250 MG/5ML solution Place 300 mg into feeding tube at bedtime.    [provider]  Glucose (RA TRUEPLUS GLUCOSE) 15 GM/32ML GEL Give 15 g by tube daily as needed (low blood sugar). Low Glucose 11/19/21   [provider]  hydrALAZINE (APRESOLINE) 100 MG tablet Take 1 tablet (100 mg total) by mouth 3 (three) times daily. Patient taking differently: Place 100 mg into feeding tube 3 (three) times daily. 11/11/16   Angiulli, Mcarthur Rossetti, PA-C  isosorbide dinitrate (ISORDIL) 20 MG tablet Place 1.5 tablets (30 mg total) into feeding tube 3 (three) times daily. 12/21/21   Almon Hercules, MD  Lactobacillus-Inulin (CULTURELLE ADULT ULT BALANCE PO) 1 capsule by PEG Tube route daily.    [provider]  lidocaine (XYLOCAINE) 2 % solution Patient: Mix 1part 2% viscous lidocaine, 1part H20. Coat mouth with and swallow 10mL of diluted mixture, before meals and at bedtime, up to QID 01/03/22   Lonie Peak, MD  LINAGLIPTIN PO Take 1 tablet by mouth daily.    [provider]  melatonin 3 MG TABS tablet Place 6 mg into feeding tube at bedtime as needed for sleep. 11/19/21   [provider]  nystatin (MYCOSTATIN) 100000 UNIT/ML suspension Take 5 mLs by mouth 3  (three) times daily.    [provider]  oxyCODONE (OXY IR/ROXICODONE) 5 MG immediate release tablet Place 5 mg into feeding tube every 6 (six) hours as needed for severe pain.    [provider]  pantoprazole sodium (PROTONIX) 40 mg/20 mL SUSP Place 40 mg into feeding tube daily.    [provider]  Pollen Extracts (PROSTAT PO) Place 30 mLs into feeding tube 2 (two) times daily. Mix 30 ml with 4 oz of water    [provider]  polyethylene glycol (MIRALAX / GLYCOLAX) 17 g packet Take 17 g by mouth daily as needed for moderate constipation. 12/21/21   Almon Hercules, MD  senna-docusate (SENOKOT-S) 8.6-50 MG tablet Place 2 tablets into feeding tube at bedtime as needed for mild constipation.    [provider]  sodium fluoride (DENTAGEL) 1.1 % GEL dental gel Place 1 Application onto teeth at bedtime.    [provider]  SPIRONOLACTONE PO Take 1 tablet by mouth daily.    [provider]  sucralfate (CARAFATE) 1 g tablet Dissolve 1 tablet in 10 mL H20 and swallow up to QID prn soreness. 01/24/22   Lonie Peak, MD  urea (CARMOL) 40 % CREA Apply 1 Application topically at bedtime. To thick scaly areas of skin    [provider]  Water For Irrigation, Sterile (FREE WATER) SOLN Place 200 mLs into feeding tube every 4 (four) hours. 12/09/21   Dorcas Carrow, MD      Allergies    Amlodipine besy-benazepril hcl and Shellfish allergy    Review of Systems   Review of Systems  Constitutional:  Positive for fatigue. Negative for activity change, appetite change, chills, diaphoresis, fever and unexpected weight change.  HENT:  Positive for facial swelling and mouth sores. Negative for congestion, dental problem, drooling, ear discharge, ear pain, hearing loss, nosebleeds, postnasal drip, rhinorrhea, sinus pressure, sinus pain, sneezing, sore throat, tinnitus, trouble swallowing and voice change.   Respiratory: Negative.    Cardiovascular:  Negative.   Gastrointestinal: Negative.   Genitourinary: Negative.   Musculoskeletal: Negative.   Skin: Negative.   Neurological: Negative.   All other systems reviewed and are negative.  Physical Exam Updated Vital Signs BP 134/65   Pulse 71   Temp 97.7 F (36.5 C) (Oral)   Resp 18   SpO2 99%  Physical Exam Vitals and nursing note reviewed.  Constitutional:      General: He is not in acute distress.    Appearance: He is well-developed. He is not ill-appearing, toxic-appearing or diaphoretic.  HENT:     Head: Normocephalic and atraumatic.     Nose: Nose normal.     Mouth/Throat:     Comments: Partial glossectomy.  Swelling bilateral lips lower greater than upper. Firm edema to lower face Eyes:     Pupils: Pupils are equal, round, and reactive to light.  Neck:     Comments: Radiation changes to face/ neck without erythema, warmth Cardiovascular:  Rate and Rhythm: Normal rate and regular rhythm.     Pulses: Normal pulses.     Heart sounds: Normal heart sounds.  Pulmonary:     Effort: Pulmonary effort is normal. No respiratory distress.     Breath sounds: Normal breath sounds.  Chest:     Comments: Dialysis access to right upper chest wall without erythema, warmth or drainage Abdominal:     General: Bowel sounds are normal. There is no distension.     Palpations: Abdomen is soft.  Musculoskeletal:        General: Normal range of motion.     Cervical back: Normal range of motion and neck supple.  Skin:    General: Skin is warm and dry.     Capillary Refill: Capillary refill takes less than 2 seconds.  Neurological:     General: No focal deficit present.     Mental Status: He is alert and oriented to person, place, and time.    ED Results / Procedures / Treatments   Labs (all labs ordered are listed, but only abnormal results are displayed) Labs Reviewed  CBC WITH DIFFERENTIAL/PLATELET - Abnormal; Notable for the following components:      Result Value   WBC  13.0 (*)    RBC 2.98 (*)    Hemoglobin 9.1 (*)    HCT 29.3 (*)    Neutro Abs 9.5 (*)    Monocytes Absolute 2.1 (*)    All other components within normal limits  BASIC METABOLIC PANEL - Abnormal; Notable for the following components:   Glucose, Bld 127 (*)    BUN 81 (*)    Creatinine, Ser 5.67 (*)    GFR, Estimated 10 (*)    All other components within normal limits  CULTURE, BLOOD (ROUTINE X 2)  CULTURE, BLOOD (ROUTINE X 2)  LACTIC ACID, PLASMA  LACTIC ACID, PLASMA    EKG None  Radiology No results found.  Procedures Procedures    Medications Ordered in ED Medications - No data to display  ED Course/ Medical Decision Making/ A&P Clinical Course as of 04/11/22 0645  Mon Apr 11, 2022  1610 66yo new dialysis patient, started last Thursday. Has SCC of left mandible -- had huge plastics removal of left face and neck. New swelling -- started on ABX. Had MR and PET Scan scheduled but hadn't received yet. Not red or warm -- if MR doesn't show anything okay to DC, if questionable infectious, likely need to call ENT at [facility] to follow up with recommendations, failed abx x 5 days. [CP]    Clinical Course User Index [CP] Olene Floss, [place]    66 year old here for evaluation of hypotension and left-sided facial pain and swelling.  Unfortunately he had squamous cell carcinoma to left mandible he is s/p surgical intervention to left tongue, mandible, face, scalp and neck.  Recently started dialysis on Thursday.  He still makes urine.  Been dealing with some soft blood pressures at home.  He was taken off of his hydralazine.  He only noted increasing fatigue.  He was seen by ENT with [facility] for left-sided facial pain and swelling.  They were unclear if this was lymphedema versus infectious process.  He was started on Augmentin.  They had planned on obtaining MRI of his face and neck as well as repeat PET scan.  On exam patient does have some heart edema with some facial swelling  however no overlying erythema or warmth.  Per family this is  increased from baseline.  He has no hypotension here.  Actually has some mild hypertension.  Labs and imaging personally viewed and interpreted:  CBC with leukocytosis at 13.0.  This appears stable.  He has been followed by ID previously for leukocytosis, hemoglobin 9.1 at baseline Metabolic panel creatinine 5.67>> patient ESRD currently.  Does not appear grossly fluid overloaded.  Potassium within normal limits.  He denies any shortness of breath.  Do not feel he needs emergent dialysis. Lactic 1.0 Blood cultures obtained  Extensive chart review.  He is followed by oncology here at [facility] however ENT at [facility].  Complete his cancer treatments in August.  Looks like they were going to obtain MRI face, neck as well as PET scan.  He has PET scan appointment for 2 days from now.  We will plan on MRI face and soft tissue neck to assess for any fluid collections.  Care transferred to oncoming provider Prosperi, PA-C who will follow-up on MRI results.  Pending results may need to discuss with ENT at [facility] if MR shows infection.                           Medical Decision Making Amount and/or Complexity of Data Reviewed Independent Historian:     Details: Family in room External Data Reviewed: labs, radiology and notes. Labs: ordered. Decision-making details documented in ED Course. Radiology: ordered and independent interpretation performed. Decision-making details documented in ED Course.  Risk OTC drugs. Prescription drug management. Parenteral controlled substances. Decision regarding hospitalization. Diagnosis or treatment significantly limited by social determinants of health.         Final Clinical Impression(s) / ED Diagnoses Final diagnoses:  ESRD (end stage renal disease) (HCC)  SCC (squamous cell carcinoma)  Facial swelling    Rx / DC Orders ED Discharge Orders     None         Zandrea Kenealy A,  PA-C 04/11/22 0645    Tilden Fossa, MD 04/12/22 0502

## 2022-04-11 NOTE — Discharge Instructions (Signed)
Please follow-up at [facility] for the PET scan that you have scheduled, please return if you have worsening swelling, pain, develop fever, chills

## 2022-04-14 ENCOUNTER — Ambulatory Visit: Payer: Medicare (Managed Care) | Attending: Radiation Oncology

## 2022-04-14 DIAGNOSIS — R1312 Dysphagia, oropharyngeal phase: Secondary | ICD-10-CM | POA: Insufficient documentation

## 2022-04-14 DIAGNOSIS — R471 Dysarthria and anarthria: Secondary | ICD-10-CM | POA: Insufficient documentation

## 2022-04-15 ENCOUNTER — Ambulatory Visit: Payer: Medicare (Managed Care)

## 2022-04-16 LAB — CULTURE, BLOOD (ROUTINE X 2)
Culture: NO GROWTH
Culture: NO GROWTH
Special Requests: ADEQUATE

## 2022-04-20 ENCOUNTER — Ambulatory Visit: Payer: Medicare (Managed Care)

## 2022-04-22 ENCOUNTER — Ambulatory Visit: Payer: Medicare (Managed Care)

## 2022-04-22 DIAGNOSIS — R471 Dysarthria and anarthria: Secondary | ICD-10-CM

## 2022-04-22 DIAGNOSIS — R1312 Dysphagia, oropharyngeal phase: Secondary | ICD-10-CM | POA: Diagnosis present

## 2022-04-22 NOTE — Therapy (Signed)
OUTPATIENT SPEECH LANGUAGE PATHOLOGY TREATMENT   Patient Name: John A Donlan Sr. MRN: [id number] DOB:1955/06/17, 66 y.o., male Today's Date: 04/22/2022  PCP: No PCP on file REFERRING PROVIDER: Raynelle Fanning, NP   End of Session - 04/22/22 2120     Visit Number 2    Number of Visits 21    Date for SLP Re-Evaluation 05/19/22    Authorization Type [facility]    SLP Start Time 1018    SLP Stop Time  1100    SLP Time Calculation (min) 42 min    Activity Tolerance Patient tolerated treatment well              Past Medical History:  Diagnosis Date   Anemia    Asthma    Chronic kidney disease    Chronic lower back pain 1995   Lumbar back surgery   Diabetic retinopathy ([place])    PDR OU   DM (diabetes mellitus) type II controlled with renal manifestation ([place])    CKD-4, peripheral neuropathy, PAD   Erectile dysfunction due to diabetes mellitus ([place])    Hepatitis C    Hyperlipidemia associated with type 2 diabetes mellitus ([place])    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  Normal EF 60 to 65%.?  GR 1 DD.  Mild LA dilation.;  CKD IV  - Cr 3.2   Hypertensive retinopathy    OU   RBBB (right bundle branch block)    Resistant hypertension    Managed by Dr. 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[facility])    Stroke ([place]) 10/2016   Has residual ataxia and partial left-sided hemiparesis   Past Surgical History:  Procedure Laterality Date   BACK SURGERY  1995   CATARACT EXTRACTION Bilateral    EXCISION MASS NECK N/A 08/06/2021   Procedure: EXCISION OF MANDIBULAR MASS;  Surgeon: Izora Gala, MD;  Location: [facility];  Service: ENT;  Laterality: N/A;   EYE SURGERY Bilateral    Cat Sx   PARS PLANA VITRECTOMY Left 01/25/2017   TOE AMPUTATION Right 2016   Right second toe; dry gangrene   TRANSTHORACIC  ECHOCARDIOGRAM  10/2016   TTE May 2018: Normal LV size and severe LVH-without LVOT gradient/S.A.M.  Normal EF 60 to 65%.?  GR 1 DD.  Mild LA dilation.   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   PAD (peripheral artery disease) ([place]) 08/17/2020   Stroke ([place]) 11/15/2016   History of stroke    Hepatitis C virus infection without hepatic coma    Brainstem infarct, acute (HCC)    Hypokalemia    Leukocytosis    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 ([place])    Cerebellar infarction ([place]) 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   Hyperlipidemia due to type 2 diabetes mellitus (HCC)    Gait disturbance, post-stroke    Acute renal failure superimposed on stage 3b chronic kidney disease ([place]) 11/02/2016   Ataxia 11/02/2016  Ischemic stroke ([place]) 11/02/2016   Puncture wound of right foot 11/02/2016   Hypertensive heart and chronic kidney disease with heart failure and stage 1 through stage 4 chronic kidney disease, or chronic kidney disease ([place]) 11/02/2016   Chronic left-sided low back pain 11/02/2016   Demand ischemia    Chest pain with moderate risk for cardiac etiology 05/24/2011   Tobacco abuse 05/24/2011   DM (diabetes mellitus), type 2 with renal complications ([place]) 33/82/5053    ONSET DATE: early 2023   REFERRING DIAG: Malignant neoplasm of mouth, unspecified  THERAPY DIAG:  Dysphagia, oropharyngeal phase  Dysarthria and anarthria  Rationale for Evaluation and Treatment Rehabilitation  SUBJECTIVE:   SUBJECTIVE STATEMENT: "I just got done with some  antibiotics. My face was really big." Pt without any overt s/sx aspiration PNA today - voice sounds clear.  Pt accompanied by: self  PERTINENT HISTORY: 66yo male admitted from SNF 12/15/21 with acute onset of worsening leukocytosis. 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 Recent hospitalization for bilateral Aspiration PNA (DC 12/09/21). Completed radiation course 01/26/22, and was d/c home from [facility] 01/18/22. FEES completed at [facility] 02/24/22.  PAIN:  Are you having pain? No  FALLS: Has patient fallen in last 6 months?  No  LIVING ENVIRONMENT: Lives with: lives alone Lives in: House/apartment  PLOF:  Level of assistance: Independent with ADLs Employment: Retired   PATIENT GOALS Eat safely  OBJECTIVE:   DIAGNOSTIC FINDINGS:  EXAM: CHEST - 2 VIEW COMPARISON:  02/16/2022. FINDINGS: The heart size and mediastinal contours are stable. Airspace disease is noted in the medial aspect of the right lower lobe. No effusion or pneumothorax. Surgical clips are present in the left axilla and cervical soft tissues. No acute osseous abnormality. IMPRESSION: Airspace disease in the medial aspect of the right lower lobe, concerning for pneumonia. Electronically Signed   By: Brett Fairy M.D.   On: 03/09/2022 03:20  RECOMMENDATIONS FROM OBJECTIVE SWALLOW STUDY (MBSS/FEES):   FEES [facility] 02-24-22 Procedure: A flexible endoscope was passed transnasally to obtain a superior view of the hypopharynx, larynx,and trachea. Test boluses were administered as indicated below. All boluses were tinged with food coloring for greater visualization and administered to assess swallowing physiology and aspiration risk. The examination was recorded. Test Boluses: Bolus Given: Thin liquids,  Puree Liquids Provided Via: Syringe, Straw (straw to the right posterior oral cavity) Findings: Secretion Rating: 1-Secretions observed in valleculae and/or pyriform sinuses (thick, clear with initial bolus presentations) Vocal Fold Adduction and Abduction: Complete Oral Phase: Premature spillage of the bolus over base of tongue, Prolonged oral preparatory time, Oral residue after the swallow Swallow Initiation Phase: Delayed Residue: Mild- < half the bolus remains, Moderate-half the bolus remains Laryngeal Penetration Occurred with: Thin liquid Laryngea Penetration was: Before the swallow, After the swallow Aspiration Occurred With: Thin liquid (x1) Esophageal Regurgitation into Hypopharynx: No Baseline:  Penetration-Aspiration Scale (PAS): Thin Liquid: 4, 6x1, Puree: 3 1 Material does not enter airway  2 Material enters the airway, remains above the vocal folds and is ejected from the airway  3 Material enters the airway, remains above the vocal folds and is not ejected from the airway  4 Material enters the airway, contacts the vocal folds and is ejected from the airway  5 Material enters the airway, contacts the vocal folds and is  not ejected from the airway  6 Material enters the airway, passes below the vocal folds and is ejected into the larynx or out of the airway  7 Material enters the airway, passes below the vocal folds and is not ejected from the trachea despite effort  8 Material enters the airway, passes below the vocal folds and no effort is made to eject   Compensatory techniques attempted: Throat Clear/Cough: Effective Small Bites/Sips: Effective Impressions: Patient presents with moderate oropharyngeal dysphagia related to [facility] and associated treatment. Dysphagia is characterized by reduced labial seal, reduced oral control/coordination/clearance, reduced bolus propulsion and airway protection with findings as noted above. Patient is able to compensate for oral deficits  fairly well but utilizing a straw to the right of his oral cavity, keeping his head slightly back to aid posterior transport with oral cavity clearance as needed. Patient is a few weeks post-RT and ready to proceed with increasing oral intake. Discussed initiation of liquids, as well as small amounts of runny purees adhering to aspiration precautions outlined below. We will plan to see patient back in clinic in a few weeks in coordination with Dr. Conley Canal for continued swallow assessment and management. Patient and his daughter verbalized understanding of exam results and recommendations. Prognosis: Prognosis: presumed good with continued recovery Recommendations: Diet: initiate liquids, and small amounts of runny purees; G-tube for primary nutrition and medication Aspiration Precautions: sit upright, boluses to the posterior right of patient's oral cavity, head slight recline, slow rate, fully swallow bites/sips, check for oral residue Administer Medications: via alternate means Other recommendations: complete home program of lingual laryngeal and pharyngeal strengthening exercises Re-Evaluate: Re-Eval in: ~1 month, in coordination with return ENT appointment    PATIENT REPORTED OUTCOME MEASURES (PROM): EAT-10: provided in 04-25-22 session   TODAY'S TREATMENT:  04/22/22: SWALLOW: Discussed pt's follow up objective swallow study with ST - he has FEES at [facility] on Friday 04-29-22 so Monday 04-25-22 we will work with pt's swallowing with syringe which pt will bring (did not bring today), and cxc session on Thursday 04-28-22 due to dialysis. Today SLP reviewed need for pt's oral care. He is doing oral care x3-4/day. Plan is for pt to bring syringe on Monday for SLP and pt work safely with runny puree and thin liquids with syringe. SPEECH: SLP worked with pt on his articulatoin as well today, informing pt of phonemes that he may have difficulty with - phonemes that use lips, and tongue to teeth (e.g.,  n, l, f, etc).   03/11/22: SLP discussed/explained aspiration PNA, and that since his dx of aspiration PNA SLP would like pt to use ice chips 3-4 times a day (at least) after thorough oral care at home, ensuring cleaning of pocket on lt that was ID'd at Dr. Hendricks Limes f/u on 02-24-22. He should have ice chips placing chip on posterior rt tongue and use effortful swallows with slight head recline. We will initiate runny puree with syringe and water with straw with during ST sessions until SLP confident pt can accomplish safely on his own, then he will initiate runny puree and thin trials at home.   PATIENT EDUCATION: Education details: see above in "Today's Treatment" Person educated: Patient and Child(ren) Education method: Explanation and Demonstration Education comprehension: verbalized understanding and needs further education   ASSESSMENT:  CLINICAL IMPRESSION: Patient is a 66 y.o. male who is PEG dependent, seen today for treatment of swallowing in light of FEES completed 02-24-22 and dx of aspiration PNA yesterday. Pt did not  perform oral care prior to today's evaluation so no POs were observed today, nor did pt bring syringe. Pt has not completed ice chip boluses with posterior right lingual placement at home after thorough oral care. SLP will also assist pt with HEP provided from [facility] for inhibiting muscle disuse atrophy and alleviation of swallow muscle fibrosis post radiation. Today SLP also assisted pt in thinking about the phonemes he will have difficulty with. Pt will also cont to be seen by ST at [facility] for periodic assessment and for instrumental swallowing evaluations.  OBJECTIVE IMPAIRMENTS include dysarthria and dysphagia. These impairments are limiting patient from effectively communicating at home and in community and safety when swallowing. Factors affecting potential to achieve goals and functional outcome are co-morbidities and severity of impairments. Patient will benefit from  skilled SLP services to address above impairments and improve overall function.  REHAB POTENTIAL: Fair given severity of deficits and presence of aspiration PNA ID'd yesterday, following FEES at [facility] 02-24-22.   GOALS: Goals reviewed with patient? Yes  SHORT TERM GOALS: Target date: 04/15/2022    Pt will demo swallow precautions from FEES with runny purees and thin liquids with straw in 3 sessions Baseline: Goal status: Ongoing  2.  Pt will demo HEP for swallowing with rare min A over 2 sessions Baseline:  Goal status: Ongoing  3.  Pt will verbally ID 3 overt s/s aspiration PNA in 2 sessions Baseline:  Goal status: Ongoing   LONG TERM GOALS: Target date: 05/20/2022    Pt will demo HEP for swallowing with rare min A over 2 sessions Baseline:  Goal status: Ongoing  2.  Pt will demo swallow precautions from FEES with runny purees and thin liquids with straw in 3 sessions Baseline:  Goal status: Ongoing  3.  Pt score on EAT-10 will improve over the course of ST Baseline:  Goal status: Ongoing  4.  Pt will incr speech intelligibility to 95-100% in 10 minutes conversation with modified independence in 2 sessions Baseline:  Goal status: Ongoing  PLAN: SLP FREQUENCY: 2x/week  SLP DURATION: 10 weeks  PLANNED INTERVENTIONS: Aspiration precaution training, Pharyngeal strengthening exercises, Diet toleration management , Environmental controls, Trials of upgraded texture/liquids, Internal/external aids, Multimodal communication approach, SLP instruction and feedback, Compensatory strategies, and Patient/family education    [facility], [place] 04/22/2022, 9:21 PM

## 2022-04-25 ENCOUNTER — Encounter: Payer: Self-pay | Admitting: Cardiology

## 2022-04-25 ENCOUNTER — Ambulatory Visit: Payer: Non-veteran care | Attending: Cardiology | Admitting: Cardiology

## 2022-04-25 ENCOUNTER — Ambulatory Visit: Payer: Medicare (Managed Care)

## 2022-04-25 VITALS — BP 112/62 | HR 62 | Ht 67.0 in | Wt 138.0 lb

## 2022-04-25 DIAGNOSIS — E785 Hyperlipidemia, unspecified: Secondary | ICD-10-CM

## 2022-04-25 DIAGNOSIS — E440 Moderate protein-calorie malnutrition: Secondary | ICD-10-CM | POA: Diagnosis not present

## 2022-04-25 DIAGNOSIS — I130 Hypertensive heart and chronic kidney disease with heart failure and stage 1 through stage 4 chronic kidney disease, or unspecified chronic kidney disease: Secondary | ICD-10-CM

## 2022-04-25 DIAGNOSIS — I739 Peripheral vascular disease, unspecified: Secondary | ICD-10-CM

## 2022-04-25 DIAGNOSIS — R5382 Chronic fatigue, unspecified: Secondary | ICD-10-CM

## 2022-04-25 DIAGNOSIS — E1169 Type 2 diabetes mellitus with other specified complication: Secondary | ICD-10-CM

## 2022-04-25 NOTE — Progress Notes (Unsigned)
Primary Care Provider: [facility] [place] Cardiologist: Glenetta Hew, MD [facility] Cardiologist: Dr. Gwenlyn Found Nephrologist: Dr. Posey Pronto Electrophysiologist: None  Clinic Note: Chief Complaint  Patient presents with   Follow-up    Delayed follow-up.  Has been undergoing treatment for head neck cancer. No cardiac symptoms.   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  His blood pressure is pretty well-controlled is not really having any heart failure symptoms.  He is on hydralazine plus Isordil (100/30) TID along with 25 mg twice daily carvedilol.  No longer taking spironolactone.  Blood pressure be managed by nephrology.  Volume control by dialysis.      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  Can reassess ABIs once he has his head and neck cancer treatment completed   Continue statin and aspirin along with blood pressure control.      Relevant Orders   EKG 12-Lead (Completed)   Lipid panel   Comprehensive metabolic panel     Other   Malnutrition of moderate degree    On tube feeds.  Has lost a lot of weight.  Defer to cancer doctors.      Relevant Orders   EKG 12-Lead (Completed)   Lipid panel   Comprehensive metabolic panel   Fatigue    On high-dose blocker, but has multiple reasons for fatigue.      Dyslipidemia    He  is on a statin.  Will order lipid panel.       ===================================  HPI:    John Jacobson. is a 66 y.o. male with a PMH below who presents today for Delayed 9 month f/u at request of [facility] [place]*.  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  Seen by [facility] 03/11/2020-  I last saw John Jacobson in March 2022 for initial consultation-evaluation of chronic symptoms of chest pain often postprandial but now worse with exertion.  Concern for possible progressive angina.  We also had hypertensive heart disease with heart failure and kidney disease/resistant hypertension on multiple medications not actively controlled.  Chose to defer management of hypertension to his nephrologist.  With risk factors, we chose to order Myoview (trying to avoid contrast because of renal insufficiency.-ordered Myoview and lower extremity artery Dopplers... He was on lisinopril and carvedilol as well as hydralazine, Imdur and doxazosin.  I deferred diuretic management to his nephrologist as well.  He was on aspirin Plavix because of prior stroke.  He was referred to Dr.  Quay Burow for claudication: ABIs (R ABI 0.7 mg daily ABI 0.96).  No clinical evidence of critical limb ischemia =-no angiography 2/2 CKD4 (is now on HD)  John Jacobson. was last seen on July 26, 2021 by Dr. Phineas Inches for preop evaluation for mandibular mass excision ([facility]).  Either oral lichen planus, biopsies thankfully negative for carcinoma surgery was back in February).  RCRI  15% due to renal disease, history of stroke and history of "CHF ".  Felt to be acceptable for proceeding to the OR without further evaluation.  Was euvolemic.  Continued on spironolactone and 20 mg Lasix.  Recent Hospitalizations:  Admitted in late June for presumed pneumonia.  Patient left [facility] July 5 admitted for leukocytosis and bacteremia. He he has been undergoing radiation therapy to his mandibular mass. Complicated by oral Candida  Reviewed  CV studies:    The following studies were reviewed today: (if available, images/films reviewed: From Epic Chart or Care Everywhere) TTE 11/03/2016-  severe LVH, no significant LVOT, No SAM, Normal LV function. No valve disease. IVC is normal.  No critical limb ischemia. Lexiscan Myoview 08/27/2020: Hypertensive.  EF 55 to 65%.  (59%).  No ischemia or infarction.  LOW RISK.  Interval History:   John A Strada Sr. presents here today for feeling somewhat lousy.  He is now on dialysis.  Started about 2 weeks ago. Still having some issues post-op - mouth & lips swollen - slurring of speech.   With him not feeling very well and having dialysis etc., he really has not been on an active.  He has some mild lower extreme edema, but is not noticing any issues at all with chest pain or pressure.  He is just not doing anything.  He has some mild exertional dyspnea because he is out of shape.  The swelling is mostly on the days when he is on dialysis.  He has some mild nonlimiting claudication.  CV Review of Symptoms (Summary): Cardiovascular ROS: no chest pain or dyspnea on exertion positive for - mild EOD swelling - on no HD days; +non-limiting claudication - walks with rollator negative for - chest pain, dyspnea on exertion, edema, irregular heartbeat, orthopnea, palpitations, paroxysmal nocturnal dyspnea, rapid heart rate, shortness of breath, or syncope/near syncope, TIA/amaurosis fugax.    REVIEWED OF SYSTEMS   Review of Systems  Constitutional:   Positive for malaise/fatigue (Pretty much malnourished.  Not really eating is being fed through tube feeds.  Constantly nauseated.  No energy.  Was hoping that when his radiation therapy finished he would feel better.) and weight loss. Negative for chills and fever.  HENT:  Negative for nosebleeds.   Respiratory:  Negative for cough, shortness of breath and wheezing.   Cardiovascular:  Positive for claudication.  Gastrointestinal:  Negative for blood in stool and melena.  Genitourinary:  Negative for hematuria.  Neurological:  Positive for weakness. Negative for dizziness and headaches.       Poor balance - using rolator now  Psychiatric/Behavioral:  Positive for memory loss. Negative for depression. The patient is not nervous/anxious and does not have insomnia.    I have reviewed and (if needed) personally updated the patient's problem list, medications, allergies, past medical and surgical history, social and family history.   PAST MEDICAL HISTORY   Past Medical History:  Diagnosis Date   Anemia    Asthma    Chronic kidney disease    Chronic lower back pain 1995   Lumbar back surgery  Diabetic retinopathy ([place])    PDR OU   DM (diabetes mellitus) type II controlled with renal manifestation (HCC)    CKD-4, peripheral neuropathy, PAD   Erectile dysfunction due to diabetes mellitus ([place])    Hepatitis C    Hyperlipidemia associated with type 2 diabetes mellitus ([place])    Hypertensive heart disease with congestive heart failure and chronic kidney disease ([place])    TTE May 2018: Normal LV size and severe LVH-without LVOT gradient/S.A.M.  Normal EF 60 to 65%.?  GR 1 DD.  Mild LA dilation.;  CKD IV  - Cr 3.2   Hypertensive retinopathy    OU   RBBB (right bundle branch block)    Resistant hypertension    Managed by Dr. 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[facility])    Stroke ([place]) 10/2016   Has residual ataxia and partial left-sided hemiparesis    PAST  SURGICAL HISTORY   Past Surgical History:  Procedure Laterality Date   BACK SURGERY  1995   CATARACT EXTRACTION Bilateral    EXCISION MASS NECK N/A 08/06/2021   Procedure: EXCISION OF MANDIBULAR MASS;  Surgeon: Izora Gala, MD;  Location: [facility];  Service: ENT;  Laterality: N/A;   EYE SURGERY Bilateral    Cat Sx   PARS PLANA VITRECTOMY Left 01/25/2017   TOE AMPUTATION Right 2016   Right second toe; dry gangrene   TRANSTHORACIC ECHOCARDIOGRAM  10/2016   TTE May 2018: Normal LV size and severe LVH-without LVOT gradient/S.A.M.  Normal EF 60 to 65%.?  GR 1 DD.  Mild LA dilation.    Immunization History  Administered Date(s) Administered   Hep A / Hep B 07/20/2016, 08/17/2016   Influenza, Seasonal, Injecte, Preservative Fre 03/10/2016   Influenza,inj,Quad PF,6+ Mos 05/30/2017   Influenza-Unspecified 03/17/2003   Moderna Covid-19 Vaccine Bivalent Booster 20yr & up 05/21/2021   Moderna Sars-Covid-2 Vaccination 07/25/2019, 08/21/2019, 05/21/2020, 12/29/2020   Pneumococcal Conjugate-13 09/28/2017   Pneumococcal-Unspecified 03/17/2003   Tdap 04/12/2013, 11/02/2016    MEDICATIONS/ALLERGIES   Current Meds  Medication Sig   acetaminophen (TYLENOL) 500 MG tablet Place 1,000 mg into feeding tube 2 (two) times daily as needed for mild pain.   Amino Acids-Protein Hydrolys (FEEDING SUPPLEMENT, PRO-STAT SUGAR FREE 64,) LIQD Take 30 mLs by mouth 2 (two) times daily.   amoxicillin-clavulanate (AUGMENTIN) 250-62.5 MG/5ML suspension SMARTSIG:10 Milliliter(s) Gastro Tube Twice Daily   aspirin 81 MG EC tablet Take 81 mg by mouth daily. Per tube   chlorhexidine (PERIDEX) 0.12 % solution Use as directed 15 mLs in the mouth or throat 3 (three) times daily.   gabapentin (NEURONTIN) 250 MG/5ML solution Place 300 mg into feeding tube at bedtime.   Glucose (RA TRUEPLUS GLUCOSE) 15 GM/32ML GEL Give 15 g by tube daily as needed (low blood sugar). Low Glucose   lidocaine (XYLOCAINE) 2 % solution Patient: Mix  1part 2% viscous lidocaine, 1part H20. Coat mouth with and swallow 182mof diluted mixture, 3013mbefore meals and at bedtime, up to QID   Nutritional Supplements (FEEDING SUPPLEMENT, NEPRO CARB STEADY,) LIQD 237 mLs by Per J Tube route 5 (five) times daily.   oxyCODONE (OXY IR/ROXICODONE) 5 MG immediate release tablet Place 5 mg into feeding tube every 6 (six) hours as needed for severe pain.   polyethylene glycol (MIRALAX / GLYCOLAX) 17 g packet Take 17 g by mouth daily as needed for moderate constipation.   senna-docusate (SENOKOT-S) 8.6-50 MG tablet Place 2 tablets into feeding tube  at bedtime as needed for mild constipation.   sodium fluoride (DENTAGEL) 1.1 % GEL dental gel Place 1 Application onto teeth at bedtime.   sucralfate (CARAFATE) 1 g tablet Dissolve 1 tablet in 10 mL H20 and swallow up to QID prn soreness.   urea (CARMOL) 40 % CREA Apply 1 Application topically at bedtime. To thick scaly areas of skin   Water For Irrigation, Sterile (FREE WATER) SOLN Place 200 mLs into feeding tube every 4 (four) hours.    Allergies  Allergen Reactions   Amlodipine Besy-Benazepril Hcl Anaphylaxis, Shortness Of Breath and Swelling    Mouth and tongue swelling   Shellfish Allergy Anaphylaxis, Shortness Of Breath and Swelling    SOCIAL HISTORY/FAMILY HISTORY   Reviewed in Epic:  Pertinent findings:  Social History   Tobacco Use   Smoking status: Former    Packs/day: 0.25    Types: Cigarettes    Quit date: 01/07/2018    Years since quitting: 4.3   Smokeless tobacco: Never  Vaping Use   Vaping Use: Never used  Substance Use Topics   Alcohol use: No    Comment: Few beers every other day hx   Drug use: No   Social History   Social History Narrative   Lives a home-currently alone.     College grad.-Former Optician, dispensing at [employer], and also Scientist, clinical (histocompatibility and immunogenetics).   Children - 3 with 4 grandchildren and one great grandson.   Caffeine 2-3 cups coffee / wk former  smoker of less than a pack a week.  Quit in 2019      He usually was quite active walking etc.  Now mild able to do anything for the last several months.    OBJCTIVE -PE, EKG, labs   Wt Readings from Last 3 Encounters:  05/11/22 140 lb (63.5 kg)  04/25/22 138 lb (62.6 kg)  01/07/22 152 lb (68.9 kg)  Continued wgt loss - 2/2 Ca- -- not taking PO -- G tube feeds.  Physical Exam: BP 112/62 (BP Location: Left Arm, Patient Position: Sitting, Cuff Size: Normal)   Pulse 62   Ht '5\' 7"'$  (1.702 m)   Wt 138 lb (62.6 kg)   SpO2 99%   BMI 21.61 kg/m  Physical Exam Vitals reviewed.  Constitutional:      General: He is not in acute distress.    Appearance: He is ill-appearing (Chronically ill-appearing.  Thin and frail.).  HENT:     Head:     Comments: Large mandibular mass on the left side is swelling of the tongue and mouth.  Difficulty speaking.  Difficulty swallowing. Neck:     Vascular: No carotid bruit.  Cardiovascular:     Rate and Rhythm: Normal rate and regular rhythm.     Pulses: Normal pulses.     Heart sounds: Heart sounds are distant. Murmur (Soft 1/6 SEM at RUSB.) heard.     No friction rub. Gallop present. S4 sounds present.  Pulmonary:     Effort: Pulmonary effort is normal. No respiratory distress.     Breath sounds: No wheezing or rales.     Comments: Course breath sounds probably due to upper respiratory Musculoskeletal:        General: No swelling. Normal range of motion.     Cervical back: Normal range of motion.  Skin:    General: Skin is warm and dry.  Neurological:     General: No focal deficit present.  Mental Status: He is alert and oriented to person, place, and time.  Psychiatric:     Comments: Depressed mood.  Poor historian.      Adult ECG Report  Rate: 62 ;  Rhythm: normal sinus rhythm, sinus arrhythmia, and RBBB.  Otherwise ST-T wave changes from repolarization. ;   Narrative Interpretation: Stable  Recent Labs: No recent labs. Lab Results   Component Value Date   CHOL 149 11/03/2016   HDL 37 (L) 11/03/2016   LDLCALC 94 11/03/2016   TRIG 91 11/03/2016   CHOLHDL 4.0 11/03/2016   Lab Results  Component Value Date   CREATININE 5.67 (H) 04/10/2022   BUN 81 (H) 04/10/2022   NA 136 04/10/2022   K 3.9 04/10/2022   CL 98 04/10/2022   CO2 27 04/10/2022      Latest Ref Rng & Units 04/10/2022   11:39 PM 12/21/2021   11:21 AM 12/21/2021    6:10 AM  CBC  WBC 4.0 - 10.5 K/uL 13.0  14.1  12.3   Hemoglobin 13.0 - 17.0 g/dL 9.1  8.4  7.6   Hematocrit 39.0 - 52.0 % 29.3  26.5  24.4   Platelets 150 - 400 K/uL 164  200  167     ================================================== I spent a total of 20 minutes with the patient spent in direct patient consultation.  Additional time spent with chart review  / charting (studies, outside notes, etc): 16 min Total Time: 36 min  Current medicines are reviewed at length with the patient today.  (+/- concerns) none  Notice: This dictation was prepared with Dragon dictation along with smart phrase technology. Any transcriptional errors that result from this process are unintentional and may not be corrected upon review.  Studies Ordered:   Orders Placed This Encounter  Procedures   Lipid panel   Comprehensive metabolic panel   EKG 56-LOVF   No orders of the defined types were placed in this encounter.   Patient Instructions / Medication Changes & Studies & Tests Ordered   Patient Instructions  Medication Instructions:   No changes *If you need a refill on your cardiac medications before your next appointment, please call your pharmacy*   Lab Work: Lipid Cmp  If you have labs (blood work) drawn today and your tests are completely normal, you will receive your results only by: MyChart Message (if you have MyChart) OR A paper copy in the mail If you have any lab test that is abnormal or we need to change your treatment, we will call you to review the  results.   Testing/Procedures: Not needed   Follow-Up: At [facility], you and your health needs are our priority.  As part of our continuing mission to provide you with exceptional heart care, we have created designated Provider Care Teams.  These Care Teams include your primary Cardiologist (physician) and Advanced Practice Providers (APPs -  Physician Assistants and Nurse Practitioners) who all work together to provide you with the care you need, when you need it.     Your next appointment:   6 month(s)  The format for your next appointment:   In Person  Provider:   Coletta Memos, FNP    Then, Glenetta Hew, MD will plan to see you again in 12 month(s).   Other Instructions      Leonie Man, MD, MS Glenetta Hew, M.D., M.S. Interventional Cardiologist  [place]  Pager # [phone] Phone # [phone] [address] [place],  [place] [address]   Thank you for choosing [facility]!!

## 2022-04-25 NOTE — Patient Instructions (Addendum)
Medication Instructions:   No changes *If you need a refill on your cardiac medications before your next appointment, please call your pharmacy*   Lab Work: Lipid Cmp  If you have labs (blood work) drawn today and your tests are completely normal, you will receive your results only by: [place] (if you have MyChart) OR A paper copy in the mail If you have any lab test that is abnormal or we need to change your treatment, we will call you to review the results.   Testing/Procedures: Not needed   Follow-Up: At [facility], you and your health needs are our priority.  As part of our continuing mission to provide you with exceptional heart care, we have created designated Provider Care Teams.  These Care Teams include your primary Cardiologist (physician) and Advanced Practice Providers (APPs -  Physician Assistants and Nurse Practitioners) who all work together to provide you with the care you need, when you need it.     Your next appointment:   6 month(s)  The format for your next appointment:   In Person  Provider:   Coletta Memos, FNP    Then, Glenetta Hew, MD will plan to see you again in 12 month(s).   Other Instructions

## 2022-04-26 NOTE — Progress Notes (Signed)
[facility] Note  04/27/2022     CHIEF COMPLAINT Patient presents for Retina Follow Up  HISTORY OF PRESENT ILLNESS: John A Phares Zaccone. is a 66 y.o. male who presents to the clinic today for:   HPI     Retina Follow Up   Patient presents with  Diabetic Retinopathy.  In both eyes.  This started years ago.  Severity is moderate.  Duration of 8 weeks.  Since onset it is stable.  I, the attending physician,  performed the HPI with the patient and updated documentation appropriately.        Comments   Patient feels that vision is blurry. He also states that the eyes are dry. He started dialysis 04/07/22. His blood sugar was not checked today.       Last edited by Bernarda Caffey, MD on 04/27/2022  5:25 PM.    Pt just came back from his ENT dr's, they did a biopsy in the office bc they think the cancer may have come back, he is not undergoing any treatment right now, pt is now on dialysis 3 days a week, pt feels like right eye vision is down  Referring physician: Inc, [address],  [place] [address]  HISTORICAL INFORMATION:   Selected notes from the MEDICAL RECORD NUMBER Referred by Dr. Dorian [facility]) for DM exam   CURRENT MEDICATIONS: No current outpatient medications on file. (Ophthalmic Drugs)   No current facility-administered medications for this visit. (Ophthalmic Drugs)   Current Outpatient Medications (Other)  Medication Sig   acetaminophen (TYLENOL) 500 MG tablet Place 1,000 mg into feeding tube 2 (two) times daily as needed for mild pain.   Amino Acids-Protein Hydrolys (FEEDING SUPPLEMENT, PRO-STAT SUGAR FREE 64,) LIQD Take 30 mLs by mouth 2 (two) times daily.   amoxicillin-clavulanate (AUGMENTIN) 250-62.5 MG/5ML suspension SMARTSIG:10 Milliliter(s) Gastro Tube Twice Daily   aspirin 81 MG EC tablet Take 81 mg by mouth daily. Per tube   atorvastatin (LIPITOR) 40 MG tablet  Place 40 mg into feeding tube daily. (Patient not taking: Reported on 04/25/2022)   B Complex-C-Folic Acid (NEPHRO VITAMINS PO) Place 237 mLs into feeding tube See admin instructions. Five times a day (Patient not taking: Reported on 04/25/2022)   carvedilol (COREG) 25 MG tablet Take 25 mg by mouth every 12 (twelve) hours. (Patient not taking: Reported on 04/25/2022)   chlorhexidine (PERIDEX) 0.12 % solution Use as directed 15 mLs in the mouth or throat 3 (three) times daily.   ferrous sulfate 220 (44 Fe) MG/5ML solution Place 300 mg into feeding tube daily. (Patient not taking: Reported on 04/25/2022)   gabapentin (NEURONTIN) 250 MG/5ML solution Place 300 mg into feeding tube at bedtime.   Glucose (RA TRUEPLUS GLUCOSE) 15 GM/32ML GEL Give 15 g by tube daily as needed (low blood sugar). Low Glucose   hydrALAZINE (APRESOLINE) 100 MG tablet Take 1 tablet (100 mg total) by mouth 3 (three) times daily. (Patient not taking: Reported on 04/25/2022)   isosorbide dinitrate (ISORDIL) 20 MG tablet Place 1.5 tablets (30 mg total) into feeding tube 3 (three) times daily. (Patient not taking: Reported on 04/25/2022)   Lactobacillus-Inulin (CULTURELLE ADULT ULT BALANCE PO) 1 capsule by PEG Tube route daily. (Patient not taking: Reported on 04/25/2022)   lidocaine (XYLOCAINE) 2 % solution Patient: Mix 1part 2% viscous lidocaine, 1part H20. Coat mouth with and swallow 60m of diluted mixture, 330m before  meals and at bedtime, up to QID   LINAGLIPTIN PO Take 1 tablet by mouth daily. (Patient not taking: Reported on 04/25/2022)   melatonin 3 MG TABS tablet Place 6 mg into feeding tube at bedtime as needed for sleep. (Patient not taking: Reported on 04/25/2022)   Nutritional Supplements (FEEDING SUPPLEMENT, NEPRO CARB STEADY,) LIQD 237 mLs by Per J Tube route 5 (five) times daily.   nystatin (MYCOSTATIN) 100000 UNIT/ML suspension Take 5 mLs by mouth 3 (three) times daily. (Patient not taking: Reported on 04/25/2022)    oxyCODONE (OXY IR/ROXICODONE) 5 MG immediate release tablet Place 5 mg into feeding tube every 6 (six) hours as needed for severe pain.   pantoprazole sodium (PROTONIX) 40 mg/20 mL SUSP Place 40 mg into feeding tube daily. (Patient not taking: Reported on 04/25/2022)   Pollen Extracts (PROSTAT PO) Place 30 mLs into feeding tube 2 (two) times daily. Mix 30 ml with 4 oz of water (Patient not taking: Reported on 04/25/2022)   polyethylene glycol (MIRALAX / GLYCOLAX) 17 g packet Take 17 g by mouth daily as needed for moderate constipation.   senna-docusate (SENOKOT-S) 8.6-50 MG tablet Place 2 tablets into feeding tube at bedtime as needed for mild constipation.   sodium fluoride (DENTAGEL) 1.1 % GEL dental gel Place 1 Application onto teeth at bedtime.   SPIRONOLACTONE PO Take 1 tablet by mouth daily. (Patient not taking: Reported on 04/25/2022)   sucralfate (CARAFATE) 1 g tablet Dissolve 1 tablet in 10 mL H20 and swallow up to QID prn soreness.   urea (CARMOL) 40 % CREA Apply 1 Application topically at bedtime. To thick scaly areas of skin   Water For Irrigation, Sterile (FREE WATER) SOLN Place 200 mLs into feeding tube every 4 (four) hours.   No current facility-administered medications for this visit. (Other)   REVIEW OF SYSTEMS: ROS   Positive for: Neurological, Genitourinary, Endocrine, Cardiovascular, Eyes Negative for: Constitutional, Gastrointestinal, Skin, Musculoskeletal, HENT, Respiratory, Psychiatric, Allergic/Imm, Heme/Lymph Last edited by Annie Paras, COT on 04/27/2022  1:37 PM.     ALLERGIES Allergies  Allergen Reactions   Amlodipine Besy-Benazepril Hcl Anaphylaxis, Shortness Of Breath and Swelling    Mouth and tongue swelling   Shellfish Allergy Anaphylaxis, Shortness Of Breath and Swelling   PAST MEDICAL HISTORY Past Medical History:  Diagnosis Date   Anemia    Asthma    Chronic kidney disease    Chronic lower back pain 1995   Lumbar back surgery   Diabetic  retinopathy ([place])    PDR OU   DM (diabetes mellitus) type II controlled with renal manifestation ([place])    CKD-4, peripheral neuropathy, PAD   Erectile dysfunction due to diabetes mellitus ([place])    Hepatitis C    Hyperlipidemia associated with type 2 diabetes mellitus ([place])    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  Normal EF 60 to 65%.?  GR 1 DD.  Mild LA dilation.;  CKD IV  - Cr 3.2   Hypertensive retinopathy    OU   RBBB (right bundle branch block)    Resistant hypertension    Managed by Dr. 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[facility])    Stroke [facility]) 10/2016   Has residual ataxia and partial left-sided hemiparesis   Past Surgical History:  Procedure Laterality Date   [place]  CATARACT EXTRACTION Bilateral    EXCISION MASS NECK N/A 08/06/2021   Procedure: EXCISION OF MANDIBULAR MASS;  Surgeon: Izora Gala, MD;  Location: [place];  Service: ENT;  Laterality: N/A;   EYE SURGERY Bilateral    Cat Sx   PARS PLANA VITRECTOMY Left 01/25/2017   TOE AMPUTATION Right 2016   Right second toe; dry gangrene   TRANSTHORACIC ECHOCARDIOGRAM  10/2016   TTE May 2018: Normal LV size and severe LVH-without LVOT gradient/S.A.M.  Normal EF 60 to 65%.?  GR 1 DD.  Mild LA dilation.   FAMILY HISTORY Family History  Problem Relation Age of Onset   Coronary artery disease Mother        MI in her 56s   Diabetes Mother    Macular degeneration Mother    Hypertension Other    Diabetes Other    Alzheimer's disease Other    Coronary artery disease Brother    Diabetes Brother    Diabetes Sister    SOCIAL HISTORY Social History   Tobacco Use   Smoking status: Former    Packs/day: 0.25    Types: Cigarettes    Quit date: 01/07/2018    Years since quitting: 4.3   Smokeless tobacco: Never  Vaping Use   Vaping Use: Never used   Substance Use Topics   Alcohol use: No    Comment: Few beers every other day hx   Drug use: No       OPHTHALMIC EXAM:  Base Eye Exam     Visual Acuity (Snellen - Linear)       Right Left   Dist [place] 20/70 +2 20/25   Dist ph [place] NI          Tonometry (Tonopen, 1:41 PM)       Right Left   Pressure 12 15         Pupils       Dark Light Shape React APD   Right 3 2 Round Brisk None   Left 3 2 Round Brisk None         Visual Fields       Left Right    Full Full         Extraocular Movement       Right Left    Full, Ortho Full, Ortho         Neuro/Psych     Oriented x3: Yes   Mood/Affect: Normal         Dilation     Both eyes: 1.0% Mydriacyl, 2.5% Phenylephrine @ 1:38 PM           Slit Lamp and Fundus Exam     Slit Lamp Exam       Right Left   Lids/Lashes Dermatochalasis - upper lid, mild Meibomian gland dysfunction Dermatochalasis - upper lid, mild Meibomian gland dysfunction   Conjunctiva/Sclera Melanosis, nasal and temporal pinguecula nasal/temporal Pinguecula, Melanosis   Cornea 3+punctate epithelial erosions, well healed temporal cataract wounds, mild Debris in tear film 3+ fine Punctate epithelial erosions, well healed cataract wounds, arcus, tear film debris   Anterior Chamber deep, narrow temporal angle, no cell or flare deep, narrow temporal angle; no cell/flare   Iris Round and poorly dilated to 5.10m, No NVI Round and moderately dilated, No NVI   Lens PC IOL in good position PC IOL in good position, mild PC folds   Anterior Vitreous Vitreous syneresis, +condensations greatest inferiorly, old, white VH post vitrectomy  Fundus Exam       Right Left   Disc mild pallor, sharp rim mild pallor, sharp rim   C/D Ratio 0.5 0.5   Macula good foveal reflex, scattered MA/DBH greatest temporal macula, +Epiretinal membrane, trace cystic changes; scattered exudates - improving; mild scattered fibrosis Flat, good foveal reflex,  scattered Microaneurysms, temporal macula very ischemic, persistent edema/cystic changes temporal macula -- improved   Vessels attenuated, Tortuous attenuated, copper wiring, mild tortuosity   Periphery Attached, 360 PRP, good posterior laser fill in, scattered DBH Attached, good 360 PRP in place, scattered DBH            IMAGING AND PROCEDURES  Imaging and Procedures for '@TODAY'$ @  OCT, Retina - OU - Both Eyes       Right Eye Quality was good. Central Foveal Thickness: 224. Progression has been stable. Findings include normal foveal contour, no SRF, intraretinal hyper-reflective material, epiretinal membrane, intraretinal fluid, macular pucker, outer retinal atrophy (Trace cystic changes -- scattered and non-central, persistent vitreous opacities).   Left Eye Quality was good. Central Foveal Thickness: 241. Progression has improved. Findings include normal foveal contour, no SRF, epiretinal membrane, intraretinal fluid, inner retinal atrophy, outer retinal atrophy (interval improvement in IRF / edema temporal macula ).   Notes *Images captured and stored on drive  Diagnosis / Impression:  OU: ERM, diffuse atrophy, history of DME OD: Trace cystic changes -- scattered and non-central, persistent vitreous opacities OS: interval improvement in IRF / edema temporal macula   Clinical management:  See below  Abbreviations: NFP - Normal foveal profile. CME - cystoid macular edema. PED - pigment epithelial detachment. IRF - intraretinal fluid. SRF - subretinal fluid. EZ - ellipsoid zone. ERM - epiretinal membrane. ORA - outer retinal atrophy. ORT - outer retinal tubulation. SRHM - subretinal hyper-reflective material            ASSESSMENT/PLAN:    ICD-10-CM   1. Proliferative diabetic retinopathy of both eyes with macular edema associated with type 2 diabetes mellitus ([place])  E11.3513 OCT, Retina - OU - Both Eyes    2. Epiretinal membrane (ERM) of both eyes  H35.373     3.  Essential hypertension  I10     4. Hypertensive retinopathy of both eyes  H35.033     5. Pseudophakia, both eyes  Z96.1     6. Dry eyes  H04.123       1. Proliferative diabetic retinopathy w/ DME, OU  - lost to f/u from 12.19.22 to 08.02.23 (9 mos) due to oral cancer ([facility])  - lost to f/u from 4.11.22 to 9.26.22 -- 5+ mos, CHF exacerbations  - former pt of Dwana Melena at [facility] and Adonis Brook at [place] -- known history of proliferative diabetic retinopathy  - history of PPV OS and laser PRP OD with Dr. Manuella Ghazi for PDR OU w/ TRD OS -- 01/25/17  - history of intravitreal anti-VEGF therapy with Dr. Anderson Malta -- last IVE ~01/2018  - s/p PRP fill in OD (02.27.20) -- good fill in laser in place  - s/p IVA OU #1 (02.13.20), #2 (03.12.20), #3 (05.10.20), #4 (06.08.20), #5 (07.06.20), #6 (08.03.20), #7 (08.31.20), #8 (09.28.20), #9 (10.26.20)  - ?resistance to IVA  - s/p IVE OU #1 (11.23.20), #2 (01.22.21), #3 (03.05.21), #4 (04.02.21), #5 (04.30.21), #6 (06.07.21), #7 (07.19.21), #8 (08.25.21), #9 (10.06.21), #10 (11.10.21), #11 (12.20.21), #12 (02.14.22), #13 (04.11.22), #14 (09.26.22)  - exam shows stable regression of fine NVD OD, PRP laser in place OU  -  FA (08.31.20) shows retinal NV vastly improved OD; significant vascular perfusion defects and increased FAZ OU; late leaking MA OU  - OCT shows OD: Trace cystic changes -- scattered and non-central, persistent vitreous opacities; OS: interval improvement in IRF / edema temporal macula   - BCVA 20/70 from 20/50 OD, OS 20/25 from 20/20 -- pt is monovision, OD: near, OS: distance -- dec VA mostly from dry eyes  - recommend holding off on IVE OU today 09.20.23 -- eyes relatively stable  - E[employer] informed consent form signed and scanned on 01.22.2021  - [employer] Benefits Investigation initiated 10.26.2020 -- approved as of 11.30.20  - [facility] has re authorized IVE injections  - f/u 3-4 months, DFE/OCT, possible injection(s) -- OD eye is near eye,  check vision with near card  2. Epiretinal membrane, both eyes   - mild ERM OU  - asymptomatic, no metamorphopsia  - no indication for surgery at this time  - monitor for now  3,4. Hypertensive retinopathy OU  - discussed importance of tight BP control  - monitor  5. Pseudophakia OU  - s/p CE/IOL OU with expert surgeon, Dr. Kathlen Mody (OD: 2.25.21, OS: 03.24.21)  - beautiful surgeries, doing well  - post op drops per Dr. Kathlen Mody  - monitor  6. Dry eyes OU  - recommend artificial tears and lubricating ointment as needed  Ophthalmic Meds Ordered this visit:  No orders of the defined types were placed in this encounter.    Return for f/u 3-4 months, PDR OU, DFE, OCT.  There are no Patient Instructions on file for this visit.  This document serves as a record of services personally performed by Gardiner Sleeper, MD, PhD. It was created on their behalf by Orvan Falconer, an ophthalmic technician. The creation of this record is the provider's dictation and/or activities during the visit.    Electronically signed by: Orvan Falconer, OA, 04/27/22  5:26 PM  This document serves as a record of services personally performed by Gardiner Sleeper, MD, PhD. It was created on their behalf by San Jetty. Owens Shark, OA an ophthalmic technician. The creation of this record is the provider's dictation and/or activities during the visit.    Electronically signed by: San Jetty. Owens Shark, [place] 11.15.2023 5:26 PM  Gardiner Sleeper, M.D., Ph.D. Diseases & Surgery of the Retina and Vitreous [facility]  I have reviewed the above documentation for accuracy and completeness, and I agree with the above. Gardiner Sleeper, M.D., Ph.D. 04/27/22 5:27 PM   Abbreviations: M myopia (nearsighted); A astigmatism; H hyperopia (farsighted); P presbyopia; Mrx spectacle prescription;  CTL contact lenses; OD right eye; OS left eye; OU both eyes  XT exotropia; ET esotropia; PEK punctate epithelial keratitis;  PEE punctate epithelial erosions; DES dry eye syndrome; MGD meibomian gland dysfunction; ATs artificial tears; PFAT's preservative free artificial tears; [place] nuclear sclerotic cataract; PSC posterior subcapsular cataract; ERM epi-retinal membrane; PVD posterior vitreous detachment; RD retinal detachment; DM diabetes mellitus; DR diabetic retinopathy; NPDR non-proliferative diabetic retinopathy; PDR proliferative diabetic retinopathy; CSME clinically significant macular edema; DME diabetic macular edema; dbh dot blot hemorrhages; CWS cotton wool spot; POAG primary open angle glaucoma; C/D cup-to-disc ratio; HVF humphrey visual field; GVF goldmann visual field; OCT optical coherence tomography; IOP intraocular pressure; BRVO Branch retinal vein occlusion; CRVO central retinal vein occlusion; CRAO central retinal artery occlusion; BRAO branch retinal artery occlusion; RT retinal tear; SB scleral buckle; PPV pars plana vitrectomy; VH Vitreous hemorrhage; PRP panretinal laser  photocoagulation; IVK intravitreal kenalog; VMT vitreomacular traction; MH Macular hole;  NVD neovascularization of the disc; NVE neovascularization elsewhere; AREDS age related eye disease study; ARMD age related macular degeneration; POAG primary open angle glaucoma; EBMD epithelial/anterior basement membrane dystrophy; ACIOL anterior chamber intraocular lens; IOL intraocular lens; PCIOL posterior chamber intraocular lens; Phaco/IOL phacoemulsification with intraocular lens placement; PRK photorefractive keratectomy; LASIK laser assisted in situ keratomileusis; HTN hypertension; DM diabetes mellitus; COPD chronic obstructive pulmonary disease 

## 2022-04-27 ENCOUNTER — Ambulatory Visit: Payer: Medicare (Managed Care) | Admitting: Podiatry

## 2022-04-27 ENCOUNTER — Telehealth: Payer: Self-pay

## 2022-04-27 ENCOUNTER — Ambulatory Visit (INDEPENDENT_AMBULATORY_CARE_PROVIDER_SITE_OTHER): Payer: Medicare (Managed Care) | Admitting: Ophthalmology

## 2022-04-27 ENCOUNTER — Encounter (INDEPENDENT_AMBULATORY_CARE_PROVIDER_SITE_OTHER): Payer: Self-pay | Admitting: Ophthalmology

## 2022-04-27 DIAGNOSIS — I10 Essential (primary) hypertension: Secondary | ICD-10-CM

## 2022-04-27 DIAGNOSIS — H35033 Hypertensive retinopathy, bilateral: Secondary | ICD-10-CM | POA: Diagnosis not present

## 2022-04-27 DIAGNOSIS — E113513 Type 2 diabetes mellitus with proliferative diabetic retinopathy with macular edema, bilateral: Secondary | ICD-10-CM

## 2022-04-27 DIAGNOSIS — H35373 Puckering of macula, bilateral: Secondary | ICD-10-CM

## 2022-04-27 DIAGNOSIS — H04123 Dry eye syndrome of bilateral lacrimal glands: Secondary | ICD-10-CM

## 2022-04-27 DIAGNOSIS — Z961 Presence of intraocular lens: Secondary | ICD-10-CM

## 2022-04-27 NOTE — Progress Notes (Incomplete)
John Jacobson presents today for follow-up after completing radiation to his left mandible on 01/26/2022    Pain issues, if any: *** Using a feeding tube?: *** Weight changes, if any: *** Swallowing issues, if any: *** Smoking or chewing tobacco? *** Using fluoride trays daily? *** Last ENT visit was on: *** Other notable issues, if any: ***

## 2022-04-27 NOTE — Telephone Encounter (Signed)
Np Clemens Catholic called to discuss pt need for Ct imaging tomorrow. She stated that pt had recently had a PET scan on 04-13-22 and wanted to clarify if additional imaging was needed. This was discussed with Dr. Isidore Moos and she did not feel these test were necessary as he is getting appropriate follow up with recent PET scan results. [place] stated she would cancel the imaging and let the pt know. This information was also discussed with Harlow Asa, RN as well.

## 2022-04-27 NOTE — Telephone Encounter (Signed)
Rn called pt daughter concerning pt recent PET scan results. [place] with [facility] had called to clarify if scans ordered for tomorrow were needed based on these PET scan results. In this discussion with Dr. Isidore Moos, it was discussed he might need to wait 3 months or follow up instead of next week. His daughter thought it best to wait at this time as they are still processing the new results. Rn encouraged family to reach out with any questions or concerns.

## 2022-04-28 ENCOUNTER — Ambulatory Visit: Payer: Medicare (Managed Care)

## 2022-04-28 ENCOUNTER — Ambulatory Visit
Admission: RE | Admit: 2022-04-28 | Discharge: 2022-04-28 | Disposition: A | Discharge status: Home / Self Care | Payer: Self-pay | Source: Ambulatory Visit | Attending: Radiation Oncology | Admitting: Radiation Oncology

## 2022-04-28 ENCOUNTER — Other Ambulatory Visit: Payer: Self-pay

## 2022-04-28 ENCOUNTER — Other Ambulatory Visit: Payer: Non-veteran care

## 2022-04-28 DIAGNOSIS — C069 Malignant neoplasm of mouth, unspecified: Secondary | ICD-10-CM

## 2022-04-29 NOTE — Progress Notes (Signed)
Oncology Nurse Navigator Documentation   +++Faxed request to [facility] Library for the following imaging to be pushed to [employer]:  04/13/22 PET Notification of successful fax transmission received. Order placed for imaging to be assigned to [facility] timeline. Spoke with [place] and imaging was retrieved from [employer] and added to [employer].   Harlow Asa, RN, BSN, OCN Head & Neck Oncology Nurse [place] [facility] [place] [phone]

## 2022-05-02 ENCOUNTER — Ambulatory Visit: Payer: Medicare (Managed Care) | Admitting: Physical Therapy

## 2022-05-02 NOTE — Therapy (Incomplete)
OUTPATIENT PHYSICAL THERAPY HEAD AND NECK EVALUATION   Patient Name: John Jacobson. 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  Normal EF 60 to 65%.?  GR 1 DD.  Mild LA dilation.;  CKD IV  - Cr 3.2   Hypertensive retinopathy    OU   RBBB (right bundle branch block)    Resistant hypertension    Managed by Dr. 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[facility])    Stroke ([place]) 10/2016   Has residual ataxia and partial left-sided hemiparesis   Past Surgical History:  Procedure Laterality Date   BACK SURGERY  1995   CATARACT EXTRACTION Bilateral    EXCISION MASS NECK N/A 08/06/2021   Procedure: EXCISION OF MANDIBULAR MASS;  Surgeon: Izora Gala, MD;  Location: [facility];  Service: ENT;  Laterality: N/A;   EYE SURGERY Bilateral    Cat Sx   PARS PLANA VITRECTOMY Left 01/25/2017   TOE AMPUTATION Right 2016   Right second toe; dry gangrene   TRANSTHORACIC ECHOCARDIOGRAM  10/2016   TTE May 2018: Normal LV size and severe LVH-without LVOT gradient/S.A.M.  Normal EF 60 to 65%.?  GR 1 DD.  Mild LA dilation.   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[place]) 11/30/2021   PAD (peripheral artery disease) ([place]) 08/17/2020   Stroke ([place]) 11/15/2016   History of stroke    Hepatitis C virus infection without hepatic coma    Brainstem infarct, acute (HCC)    Hypokalemia    Leukocytosis    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 ([place])    Cerebellar infarction ([place]) 11/04/2016   Hyperlipidemia due to type 2 diabetes mellitus (HCC)    Gait disturbance, post-stroke    Acute renal failure superimposed on stage 3b chronic kidney disease ([place]) 11/02/2016   Ataxia 11/02/2016   Ischemic stroke ([place]) 11/02/2016   Puncture wound of right foot 11/02/2016   Hypertensive heart and chronic kidney disease with heart failure and stage 1 through stage 4 chronic kidney disease, or chronic kidney disease ([place]) 11/02/2016   Chronic left-sided low back pain 11/02/2016   Demand ischemia    Chest pain with moderate risk for cardiac etiology 05/24/2011   Tobacco abuse 05/24/2011   DM (diabetes mellitus), type 2 with renal complications ([place]) 74/25/9563    PCP: Marland Kitchen  REFERRING PROVIDER: Judson Roch  Isidore Moos, MD  REFERRING DIAG: C06.9 (ICD-10-CM) - Oral cancer ([place])   THERAPY DIAG:  No diagnosis found.  Rationale for Evaluation and Treatment: Rehabilitation  ONSET DATE: 12/15/21  SUBJECTIVE:                                                                                                                                                                                           SUBJECTIVE STATEMENT: ***  PERTINENT HISTORY:  66yo male admitted from SNF 12/15/21 with acute onset of worsening leukocytosis. 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 Recent hospitalization for bilateral Aspiration PNA (DC 12/09/21). Completed radiation course 01/26/22, and was d/c home from [facility] 01/18/22.   PATIENT GOALS:  *** PAIN:  Are you having pain? {yes/no:20286} NPRS scale: ***/10 Pain location: *** Pain orientation: {Pain Orientation:25161}  PAIN TYPE: {type:313116} Pain description: {PAIN DESCRIPTION:21022940}  Aggravating factors: *** Relieving factors: ***  PRECAUTIONS: Recent radiation, Head and neck lymphedema risk, {Therapy precautions:24002}   OBJECTIVE:   POSTURE:  Forward head and rounded shoulders posture  30 SEC SIT TO STAND: *** reps in 30 sec {With-without:32421} use of UEs which is  {Excellent/good/avg/belowavg/poor:27017}for patient's age.   SHOULDER AROM:   {WFL/IMPAIRED:27018}  CERVICAL AROM:   Percent limited  Flexion   Extension   Right lateral flexion   Left lateral flexion   Right rotation   Left rotation     (Blank rows=not tested)  LYMPHEDEMA ASSESSMENT:    Circumference in cm  4 cm superior to sternal notch around neck   6 cm superior to sternal notch around neck   8 cm superior to sternal notch around neck   R lateral nostril from base of nose to medial tragus   L lateral nostril from base of nose to medial tragus   R corner of mouth to where ear lobe meets face   L corner of mouth to where ear lobe meets face         (Blank rows=not tested)  CURRENT/PAST TREATMENTS:  Surgery type/date: Mandibular free flap reconstruction 09/2021  Chemotherapy: ***  Radiation: Completed 01/26/22   OTHER SYMPTOMS: Pain {yes/no:20286} Fibrosis {yes/no:20286} Pitting edema {yes/no:20286} Infections {yes/no:20286} Decreased scar mobility {yes/no:20286}  PATIENT EDUCATION:  Education details: *** Person educated: {Person educated:25204} Education method:  {Education Method:25205} Education comprehension: {Education Comprehension:25206}  HOME EXERCISE PROGRAM:  ***  ASSESSMENT:  CLINICAL IMPRESSION: ***  Pt will benefit from skilled therapeutic intervention to improve on the following deficits: {  opptimpairments:25111}  PT treatment/interventions: ADL/Self care home management, {rehab planned interventions:25118::"Therapeutic exercises","Therapeutic activity","Neuromuscular re-education","Balance training","Gait training","Patient/Family education","Self Care","Joint mobilization"}   GOALS Name Target Date  Goal status  1  *** {GOALSTATUS:25110}  '2     3     4        '$ GOALS: Goals reviewed with patient? {yes/no:20286}  LONG TERM GOALS:  (STG=LTG)   PLAN:  PT FREQUENCY/DURATION: ***  PLAN FOR NEXT SESSION: ***   Manus Gunning, PT 05/02/2022, 9:03 AM

## 2022-05-03 ENCOUNTER — Inpatient Hospital Stay: Payer: No Typology Code available for payment source | Admitting: Nutrition

## 2022-05-03 ENCOUNTER — Telehealth: Payer: Self-pay | Admitting: Nutrition

## 2022-05-03 ENCOUNTER — Inpatient Hospital Stay
Admission: RE | Admit: 2022-05-03 | Discharge: 2022-05-03 | Disposition: A | Discharge status: Home / Self Care | Payer: Self-pay | Source: Ambulatory Visit | Attending: Radiation Oncology | Admitting: Radiation Oncology

## 2022-05-03 DIAGNOSIS — C411 Malignant neoplasm of mandible: Secondary | ICD-10-CM

## 2022-05-03 NOTE — Telephone Encounter (Signed)
R/s per 11/21 in basket, tried to call pt 2x no answer and voicemail is full

## 2022-05-04 ENCOUNTER — Ambulatory Visit: Payer: Medicare (Managed Care)

## 2022-05-09 ENCOUNTER — Other Ambulatory Visit: Payer: Self-pay | Admitting: *Deleted

## 2022-05-09 DIAGNOSIS — N186 End stage renal disease: Secondary | ICD-10-CM

## 2022-05-10 ENCOUNTER — Inpatient Hospital Stay: Payer: Non-veteran care | Attending: Radiation Oncology | Admitting: Dietician

## 2022-05-10 ENCOUNTER — Ambulatory Visit (INDEPENDENT_AMBULATORY_CARE_PROVIDER_SITE_OTHER)
Admission: RE | Admit: 2022-05-10 | Discharge: 2022-05-10 | Disposition: A | Discharge status: Home / Self Care | Payer: Medicare (Managed Care) | Source: Ambulatory Visit | Attending: Vascular Surgery | Admitting: Vascular Surgery

## 2022-05-10 ENCOUNTER — Ambulatory Visit (HOSPITAL_COMMUNITY)
Admission: RE | Admit: 2022-05-10 | Discharge: 2022-05-10 | Disposition: A | Discharge status: Home / Self Care | Payer: Medicare (Managed Care) | Source: Ambulatory Visit | Attending: Vascular Surgery | Admitting: Vascular Surgery

## 2022-05-10 ENCOUNTER — Other Ambulatory Visit: Payer: Self-pay

## 2022-05-10 DIAGNOSIS — N186 End stage renal disease: Secondary | ICD-10-CM

## 2022-05-10 MED ORDER — PROSOURCE TF20 ENFIT COMPATIBL EN LIQD: ENTERAL | Status: AC

## 2022-05-10 NOTE — Progress Notes (Signed)
Nutrition Follow-up:  Patient completed radiation therapy for SCC of mandible 01/28/22. He is under the care of Dr. Isidore Moos.   Met with patient in office. Daughter [name]) is present for visit today. Patient reports he is feeling down. He recently started dialysis (T, TH, S) and this is makes him feel tired. Patient reports recurrence of cancer which he learned about last week. Daughter reports waiting to hear from [facility] regarding medical oncology referral made by Dr. Conley Canal. Patient is having increased saliva due to new oral lesion. His daughter is requesting a suction machine to help patient manage this. He reports trying to give half carton of Nepro every 2 hours do to abdominal discomfort with whole carton. Patient endorses giving 2-4 cartons day depending on HD days. Patient is mixing this water to reduce bolus time. He is not flushing tube before/after feedings. Patient reports he is not giving protein modular. Daughter states patient has never received this and asking what it is. Per chart review, this was ordered/received during hospitalization in July.    Medications: reviewed   Labs: 10/29 labs reviewed and include BUN 81, Cr 5.67  Anthropometrics: Weight 137.4 lb today in office   11/13 - 138 lb  7/28 - 152 lb  6/28 - 159 lb 9.8 oz   14% (-22 lb) in 5 months - severe  NUTRITION DIAGNOSIS: Moderate malnutrition continues    INTERVENTION:  Educated on importance of working to increase tube feedings to goal. Patient will take carton of formula/supplies with him to dialysis. He is agreeable to "meal time" alarms to be set so pt does not miss a feeding - daughter is purchasing small table alarm for pt Encouraged pt not to dilute formula with water. Provide nutrition as prescribed. He is agreeable Discussed excess salvia d/t new oral lesion with Dr. Isidore Moos - orders for suction machine placed New orders placed for Prosource TF - 60 ml via tube daily (80 kcal, 20 g protein)  Support  and encouragement provided    5 cartons Nepro + 60 ml Prosource TF20 daily 200 ml water via tube 6 times daily Provides 2180 kcal, 115 g protein, 2060 ml water  MONITORING, EVALUATION, GOAL: Pt will work to increase tube feeding to goal to prevent further wt loss   NEXT VISIT: As needed - pt is followed by [facility]. Daughter has contact information

## 2022-05-11 ENCOUNTER — Encounter: Payer: Self-pay | Admitting: Vascular Surgery

## 2022-05-11 ENCOUNTER — Other Ambulatory Visit: Payer: Self-pay

## 2022-05-11 ENCOUNTER — Ambulatory Visit: Payer: Medicare (Managed Care)

## 2022-05-11 ENCOUNTER — Ambulatory Visit (INDEPENDENT_AMBULATORY_CARE_PROVIDER_SITE_OTHER): Payer: Medicare (Managed Care) | Admitting: Physician Assistant

## 2022-05-11 VITALS — BP 120/76 | HR 54 | Temp 97.8°F | Resp 20 | Ht 67.0 in | Wt 140.0 lb

## 2022-05-11 DIAGNOSIS — N186 End stage renal disease: Secondary | ICD-10-CM

## 2022-05-11 DIAGNOSIS — Z992 Dependence on renal dialysis: Secondary | ICD-10-CM

## 2022-05-11 NOTE — Progress Notes (Signed)
New Dialysis Access   Reason for Consult:  new permanent dialysis access Requesting Physician:  Madelon Lips, MD MRN #:  [id number]  History of Present Illness: John Jacobson is a 66 y.o. male who presents here for new permanent dialysis access.  He recently started hemodialysis for the first time on April 07, 2022.  He currently dialyzes on Tuesday, Thursday, and Saturdays via right IJ TDC.  He denies any issues with dialysis.  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  He is n.p.o. and takes all of his nutrition through PEG tube.  He does not take any antiplatelets or anticoagulants.  He states that he is right-handed.  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  Normal EF 60 to 65%.?  GR 1 DD.  Mild LA dilation.;  CKD IV  - Cr 3.2   Hypertensive retinopathy    OU   RBBB (right bundle branch block)    Resistant hypertension    Managed by Dr. 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[facility])    Stroke ([place]) 10/2016   Has residual ataxia and partial left-sided hemiparesis    Past Surgical History:  Procedure Laterality Date   BACK SURGERY  1995   CATARACT EXTRACTION Bilateral    EXCISION MASS NECK N/A 08/06/2021   Procedure: EXCISION OF MANDIBULAR MASS;  Surgeon: Izora Gala, MD;  Location: [facility];  Service: ENT;  Laterality: N/A;   EYE  SURGERY Bilateral    Cat Sx   PARS PLANA VITRECTOMY Left 01/25/2017   TOE AMPUTATION Right 2016   Right second toe; dry gangrene   TRANSTHORACIC ECHOCARDIOGRAM  10/2016   TTE May 2018: Normal LV size and severe LVH-without LVOT gradient/S.A.M.  Normal EF 60 to 65%.?  GR 1 DD.  Mild LA dilation.    Allergies  Allergen Reactions   Amlodipine Besy-Benazepril Hcl Anaphylaxis, Shortness Of Breath and Swelling    Mouth and tongue swelling   Shellfish Allergy Anaphylaxis, Shortness Of Breath and Swelling    Prior to Admission medications   Medication Sig Start Date End Date Taking? Authorizing Provider  acetaminophen (TYLENOL) 500 MG tablet Place 1,000 mg into feeding tube 2 (two) times daily as needed for mild pain.   Yes [provider]  Amino Acids-Protein Hydrolys (FEEDING SUPPLEMENT, PRO-STAT SUGAR FREE 64,) LIQD Take 30 mLs by mouth 2 (two) times daily.   Yes [provider]  aspirin 81 MG EC tablet Take 81 mg by mouth daily. Per tube 05/20/21  Yes [provider]  atorvastatin (LIPITOR) 40 MG tablet Place 40 mg into feeding tube daily. 05/20/21  Yes [provider]  carvedilol (COREG) 25 MG tablet Take 25 mg by mouth every 12 (twelve) hours.   Yes [provider]  chlorhexidine ([place])  0.12 % solution Use as directed 15 mLs in the mouth or throat 3 (three) times daily.   Yes [provider]  ferrous sulfate 220 (44 Fe) MG/5ML solution Place 300 mg into feeding tube daily.   Yes [provider]  gabapentin (NEURONTIN) 250 MG/5ML solution Place 300 mg into feeding tube at bedtime.   Yes [provider]  Glucose (RA TRUEPLUS GLUCOSE) 15 GM/32ML GEL Give 15 g by tube daily as needed (low blood sugar). Low Glucose 11/19/21  Yes [provider]  Lactobacillus-Inulin (CULTURELLE ADULT ULT BALANCE PO) 1 capsule by PEG Tube route daily.   Yes [provider]  lidocaine (XYLOCAINE) 2 % solution Patient: Mix  1part 2% viscous lidocaine, 1part H20. Coat mouth with and swallow 66m of diluted mixture, 392m before meals and at bedtime, up to QID 01/03/22  Yes SqEppie GibsonMD  LINAGLIPTIN PO Take 1 tablet by mouth daily.   Yes [provider]  melatonin 3 MG TABS tablet Place 6 mg into feeding tube at bedtime as needed for sleep. 11/19/21  Yes [provider]  Nutritional Supplements (FEEDING SUPPLEMENT, NEPRO CARB STEADY,) LIQD 237 mLs by Per J Tube route 5 (five) times daily.   Yes [provider]  oxyCODONE (OXY IR/ROXICODONE) 5 MG immediate release tablet Place 5 mg into feeding tube every 6 (six) hours as needed for severe pain.   Yes [provider]  Pollen Extracts (PROSTAT PO) Place 30 mLs into feeding tube 2 (two) times daily. Mix 30 ml with 4 oz of water   Yes [provider]  polyethylene glycol (MIRALAX / GLYCOLAX) 17 g packet Take 17 g by mouth daily as needed for moderate constipation. 12/21/21  Yes GoMercy RidingMD  Protein (FEEDING SUPPLEMENT, PROSOURCE TF20,) liquid Give 60 ml Prosource TF via tube daily. Flush tube with 30 ml water before and after administration. Do not mix with tube feeding formula. 05/10/22  Yes SqEppie GibsonMD  senna-docusate (SENOKOT-S) 8.6-50 MG tablet Place 2 tablets into feeding tube at bedtime as needed for mild constipation.   Yes [provider]  urea (CARMOL) 40 % CREA Apply 1 Application topically at bedtime. To thick scaly areas of skin   Yes [provider]  Water For Irrigation, Sterile (FREE WATER) SOLN Place 200 mLs into feeding tube every 4 (four) hours. 12/09/21  Yes GhBarb MerinoMD    Social History   Socioeconomic History   Marital status: Single    Spouse name: Not on file   Number of children: 3   Years of education: Not on file   Highest education level: Bachelor's degree (e.g., BA, AB, BS)  Occupational History   Not on file  Tobacco Use   Smoking status: Former     Packs/day: 0.25    Types: Cigarettes    Quit date: 01/07/2018    Years since quitting: 4.3   Smokeless tobacco: Never  Vaping Use   Vaping Use: Never used  Substance and Sexual Activity   Alcohol use: No    Comment: Few beers every other day hx   Drug use: No   Sexual activity: Not Currently  Other Topics Concern   Not on file  Social History Narrative   Lives a home-currently alone.     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   Caffeine 2-3  cups coffee / wk former smoker of less than a pack a week.  Quit in 2019      He usually was quite active walking etc.  Now mild able to do anything for the last several months.   Social Determinants of Health   Financial Resource Strain: Not on file  Food Insecurity: Not on file  Transportation Needs: No Transportation Needs (12/06/2021)   PRAPARE - Hydrologist (Medical): No    Lack of Transportation (Non-Medical): No  Physical Activity: Not on file  Stress: Not on file  Social Connections: Not on file  Intimate Partner Violence: Not on file     Family History  Problem Relation Age of Onset   Coronary artery disease Mother        MI in her 63s   Diabetes Mother    Macular degeneration Mother    Hypertension Other    Diabetes Other    Alzheimer's disease Other    Coronary artery disease Brother    Diabetes Brother    Diabetes Sister     ROS: Otherwise negative unless mentioned in HPI  Physical Examination  Vitals:   05/11/22 0955  BP: 120/76  Pulse: (!) 54  Resp: 20  Temp: 97.8 F (36.6 C)  SpO2: 97%   Body mass index is 21.93 kg/m.  General:  WDWN in NAD Gait: Not observed HENT: WNL, normocephalic, some facial swelling s/p surgery Pulmonary: normal non-labored breathing, without Rales, rhonchi,  wheezing Cardiac: RRR without carotid bruit Abdomen:  soft, NT/ND,  no masses. PEG tube Skin: without rashes Vascular Exam/Pulses: Palpable radial and brachial pulses 2+ bilaterally Extremities: Without ischemic changes or open wounds Musculoskeletal: no muscle wasting or atrophy  Neurologic: A&O X 3;  No focal weakness or paresthesias are detected; speech is fluent/normal Psychiatric:  The pt has Normal affect. Lymph:  Unremarkable  CBC    Component Value Date/Time   WBC 13.0 (H) 04/10/2022 2339   RBC 2.98 (L) 04/10/2022 2339   HGB 9.1 (L) 04/10/2022 2339   HCT 29.3 (L) 04/10/2022 2339   PLT 164 04/10/2022 2339   MCV 98.3 04/10/2022 2339   MCH 30.5 04/10/2022 2339   MCHC 31.1 04/10/2022 2339   RDW 15.5 04/10/2022 2339   LYMPHSABS 0.8 04/10/2022 2339   MONOABS 2.1 (H) 04/10/2022 2339   EOSABS 0.5 04/10/2022 2339   BASOSABS 0.0 04/10/2022 2339    BMET    Component Value Date/Time   NA 136 04/10/2022 2339   K 3.9 04/10/2022 2339   CL 98 04/10/2022 2339   CO2 27 04/10/2022 2339   GLUCOSE 127 (H) 04/10/2022 2339   BUN 81 (H) 04/10/2022 2339   CREATININE 5.67 (H) 04/10/2022 2339   CALCIUM 8.9 04/10/2022 2339   GFRNONAA 10 (L) 04/10/2022 2339   GFRAA 14 (L) 02/12/2018 0313    COAGS: Lab Results  Component Value Date   INR 1.2 12/16/2021   INR 1.3 (H) 09/14/2021   INR 1.13 11/14/2016     Non-Invasive Vascular Imaging:    [place] UE Arterial Duplex: Right: No hemodynamically significant obstruction visualized in the right upper extremity. Heavy plaque burden noted in the right radial artery. Left: No obstruction visualized in the left upper extremity.  [place] UE Vein Mapping: Right cephalic max diameter: 1.66AY Right basilic max diameter: 3.01SW  Left cephalic max diameter: 1.09NA Left basilic max diameter: 3.55DD   ASSESSMENT/PLAN: This is a 66 y.o. male who presents for new dialysis access   -  Arterial duplex study demonstrates patent biphasic flow in bilateral brachial, radial, and ulnar arteries. -Vein mapping demonstrates no  viable vein candidate for AV fistula creation in either upper extremity with basilic and cephalic vein sizes <1ET -The patient is right handed and therefore would be a candidate for left upper arm AV graft. He has easily palpable left radial and brachial pulses -He is not on any blood thinners -He will be scheduled for left upper arm AV graft with Dr.Cain in the next coming weeks. The patient has also been seen and evaluated by Dr.Cain   Vicente Serene PA-C [facility] [phone]

## 2022-05-13 ENCOUNTER — Ambulatory Visit (INDEPENDENT_AMBULATORY_CARE_PROVIDER_SITE_OTHER): Payer: Medicare (Managed Care) | Admitting: Podiatry

## 2022-05-13 DIAGNOSIS — M79674 Pain in right toe(s): Secondary | ICD-10-CM | POA: Diagnosis not present

## 2022-05-13 DIAGNOSIS — B351 Tinea unguium: Secondary | ICD-10-CM | POA: Diagnosis not present

## 2022-05-13 DIAGNOSIS — M79675 Pain in left toe(s): Secondary | ICD-10-CM

## 2022-05-13 NOTE — Progress Notes (Signed)
  Subjective:  Patient ID: John Jacobson., male    DOB: 15-Jul-1955,  MRN: [id number]  Chief Complaint  Patient presents with   Nail Problem    Nail trim. Right hallux nail came off last week    66 y.o. male returns for the above complaint.  Thickened elongated dystrophic toenails x10.  Patient states is painful to touch and would like to have it debrided down he is not able to do it himself.  Denies any other acute complaints  Objective:  There were no vitals filed for this visit. Podiatric Exam: Vascular: dorsalis pedis and posterior tibial pulses are palpable bilateral. Capillary return is immediate. Temperature gradient is WNL. Skin turgor WNL  Sensorium: Normal Semmes Weinstein monofilament test. Normal tactile sensation bilaterally. Nail Exam: Pt has thick disfigured discolored nails with subungual debris noted bilateral entire nail hallux through fifth toenails.  Pain on palpation to the nails. Ulcer Exam: There is no evidence of ulcer or pre-ulcerative changes or infection. Orthopedic Exam: Muscle tone and strength are WNL. No limitations in general ROM. No crepitus or effusions noted.  Skin: No Porokeratosis. No infection or ulcers    Assessment & Plan:   1. Pain due to onychomycosis of toenails of both feet     Patient was evaluated and treated and all questions answered.  Onychomycosis with pain  -Nails palliatively debrided as below. -Educated on self-care  Procedure: Nail Debridement Rationale: pain  Type of Debridement: manual, sharp debridement. Instrumentation: Nail nipper, rotary burr. Number of Nails: 10  Procedures and Treatment: Consent by patient was obtained for treatment procedures. The patient understood the discussion of treatment and procedures well. All questions were answered thoroughly reviewed. Debridement of mycotic and hypertrophic toenails, 1 through 5 bilateral and clearing of subungual debris. No ulceration, no infection noted.  Return  Visit-Office Procedure: Patient instructed to return to the office for a follow up visit 3 months for continued evaluation and treatment.  Boneta Lucks, DPM    Return in about 3 months (around 08/12/2022).

## 2022-05-21 ENCOUNTER — Encounter: Payer: Self-pay | Admitting: Cardiology

## 2022-05-21 NOTE — Assessment & Plan Note (Signed)
On tube feeds.  Has lost a lot of weight.  Defer to cancer doctors.

## 2022-05-21 NOTE — Assessment & Plan Note (Signed)
Resistant hypertension before, but now on dialysis.  His blood pressure is pretty well-controlled is not really having any heart failure symptoms.  He is on hydralazine plus Isordil (100/30) TID along with 25 mg twice daily carvedilol.  No longer taking spironolactone.  Blood pressure be managed by nephrology.  Volume control by dialysis.

## 2022-05-21 NOTE — Assessment & Plan Note (Signed)
On high-dose blocker, but has multiple reasons for fatigue.

## 2022-05-21 NOTE — Assessment & Plan Note (Signed)
He is on a statin.  Will order lipid panel.

## 2022-05-21 NOTE — Assessment & Plan Note (Addendum)
Not really being limited by claudication.  Can reassess ABIs once he has his head and neck cancer treatment completed   Continue statin and aspirin along with blood pressure control.

## 2022-05-28 ENCOUNTER — Other Ambulatory Visit: Payer: Self-pay

## 2022-05-28 ENCOUNTER — Encounter (HOSPITAL_COMMUNITY): Payer: Self-pay

## 2022-05-28 ENCOUNTER — Emergency Department (HOSPITAL_COMMUNITY)
Admission: EM | Admit: 2022-05-28 | Discharge: 2022-05-29 | Disposition: A | Discharge status: Home / Self Care | Payer: Medicare (Managed Care) | Attending: Emergency Medicine | Admitting: Emergency Medicine

## 2022-05-28 ENCOUNTER — Emergency Department (HOSPITAL_COMMUNITY): Payer: Medicare (Managed Care)

## 2022-05-28 DIAGNOSIS — G8929 Other chronic pain: Secondary | ICD-10-CM | POA: Insufficient documentation

## 2022-05-28 DIAGNOSIS — N186 End stage renal disease: Secondary | ICD-10-CM | POA: Insufficient documentation

## 2022-05-28 DIAGNOSIS — Z7982 Long term (current) use of aspirin: Secondary | ICD-10-CM | POA: Insufficient documentation

## 2022-05-28 DIAGNOSIS — Z992 Dependence on renal dialysis: Secondary | ICD-10-CM | POA: Diagnosis not present

## 2022-05-28 DIAGNOSIS — M545 Low back pain, unspecified: Secondary | ICD-10-CM

## 2022-05-28 DIAGNOSIS — M549 Dorsalgia, unspecified: Secondary | ICD-10-CM | POA: Diagnosis present

## 2022-05-28 DIAGNOSIS — E119 Type 2 diabetes mellitus without complications: Secondary | ICD-10-CM | POA: Insufficient documentation

## 2022-05-28 MED ORDER — OXYCODONE HCL 5 MG PO TABS
5.0000 mg | ORAL_TABLET | Freq: Once | ORAL | Status: DC
  Filled 2022-05-28: qty 1

## 2022-05-28 MED ORDER — FENTANYL CITRATE PF 50 MCG/ML IJ SOSY
100.0000 ug | PREFILLED_SYRINGE | Freq: Once | INTRAMUSCULAR | Status: DC
  Filled 2022-05-28: qty 2

## 2022-05-28 MED ORDER — HYDROMORPHONE HCL 1 MG/ML IJ SOLN
0.5000 mg | Freq: Once | INTRAMUSCULAR | Status: DC
  Filled 2022-05-28: qty 1

## 2022-05-28 MED ORDER — SALINE SPRAY 0.65 % NA SOLN
1.0000 | Freq: Once | NASAL | Status: AC
  Administered 2022-05-29: 1 via NASAL
  Filled 2022-05-28: qty 44

## 2022-05-28 MED ORDER — FENTANYL CITRATE PF 50 MCG/ML IJ SOSY
100.0000 ug | PREFILLED_SYRINGE | Freq: Once | INTRAMUSCULAR | Status: AC
  Administered 2022-05-28: 100 ug via INTRAMUSCULAR

## 2022-05-28 MED ORDER — FENTANYL CITRATE PF 50 MCG/ML IJ SOSY
100.0000 ug | PREFILLED_SYRINGE | Freq: Once | INTRAMUSCULAR | Status: AC
  Administered 2022-05-28: 100 ug via INTRAVENOUS
  Filled 2022-05-28: qty 2

## 2022-05-28 NOTE — ED Provider Triage Note (Signed)
Emergency Medicine Provider Triage Evaluation Note  John Jacobson. , a 66 y.o. male  was evaluated in triage.  Pt complains of acute on chronic low back pain.  Unable to stand due to pain.  Hx of spinal surgery "about 15 years ago."  Denies falls, trauma.  BIBEMS.  Review of Systems  Positive: Back pain Negative: Urinary incontinence, saddle anesthesia  Physical Exam  There were no vitals taken for this visit. Gen:   Awake, no distress   Resp:  Normal effort  MSK:   Moves extremities without difficulty .  + Lumbar midline tenderness Other:  Patient able to actively extend and flex hips and knees, normal stranght while seated  Medical Decision Making  Medically screening exam initiated at 5:39 PM.  Appropriate orders placed.  John A Florio Sr. was informed that the remainder of the evaluation will be completed by another provider, this initial triage assessment does not replace that evaluation, and the importance of remaining in the ED until their evaluation is complete.  Back pain, acute on chronic, without radiculopathy  CT l spine to eval for occult fx vs osseos lesion - he has oral cancer. Pain control No red flags for cauda equina syndrome at this time.   John Dusky, MD 05/28/22 1740

## 2022-05-28 NOTE — ED Provider Notes (Signed)
[facility] EMERGENCY DEPARTMENT Provider Note   CSN: [id number] Arrival date & time: 05/28/22  1730     History {Add pertinent medical, surgical, social history, OB history to HPI:1} Chief Complaint  Patient presents with   Back Pain    John Jacobson. is a 66 y.o. male.  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  States that he missed his dialysis yesterday due to pain, he states that he typically walks around with a walker, but is was unable to 2 today, he is endorsing any chest pain shortness of breath or worsening leg swelling.  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   Home Medications Prior to Admission medications   Medication Sig Start Date End Date Taking? Authorizing Provider  acetaminophen (TYLENOL) 500 MG tablet Place 1,000 mg into feeding tube 2 (two) times daily as needed for mild pain.    [provider]  Amino Acids-Protein Hydrolys (FEEDING SUPPLEMENT, PRO-STAT SUGAR FREE 64,) LIQD Take 30 mLs by mouth 2 (two) times daily.    [provider]  aspirin 81 MG EC tablet Take 81 mg by mouth daily. Per tube 05/20/21   [provider]  atorvastatin (LIPITOR) 40 MG tablet  Place 40 mg into feeding tube daily. 05/20/21   [provider]  carvedilol (COREG) 25 MG tablet Take 25 mg by mouth every 12 (twelve) hours.    [provider]  chlorhexidine (PERIDEX) 0.12 % solution Use as directed 15 mLs in the mouth or throat 3 (three) times daily.    [provider]  ferrous sulfate 220 (44 Fe) MG/5ML solution Place 300 mg into feeding tube daily.    [provider]  gabapentin (NEURONTIN) 250 MG/5ML solution Place 300 mg into feeding tube at bedtime.    [provider]  Glucose (RA TRUEPLUS GLUCOSE) 15 GM/32ML GEL Give 15 g by tube daily as needed (low blood sugar). Low Glucose 11/19/21   [provider]  Lactobacillus-Inulin (CULTURELLE ADULT ULT BALANCE PO) 1 capsule by PEG Tube route daily.    [provider]  lidocaine (XYLOCAINE) 2 % solution Patient: Mix 1part 2% viscous lidocaine, 1part H20. Coat mouth with and swallow 52m of diluted mixture, 388m before meals and at bedtime, up to QID 01/03/22   SqEppie GibsonMD  LINAGLIPTIN PO Take 1 tablet by mouth daily.    [provider]  melatonin 3 MG TABS tablet Place 6 mg into feeding tube at bedtime as needed for sleep. 11/19/21   [provider]  Nutritional Supplements (FEEDING SUPPLEMENT, NEPRO CARB STEADY,) LIQD 237 mLs by Per J Tube route 5 (five) times daily.    [provider]  oxyCODONE (OXY IR/ROXICODONE) 5 MG immediate release tablet Place 5 mg into  feeding tube every 6 (six) hours as needed for severe pain.    [provider]  Pollen Extracts (PROSTAT PO) Place 30 mLs into feeding tube 2 (two) times daily. Mix 30 ml with 4 oz of water    [provider]  polyethylene glycol (MIRALAX / GLYCOLAX) 17 g packet Take 17 g by mouth daily as needed for moderate constipation. 12/21/21   Mercy Riding, MD  Protein (FEEDING SUPPLEMENT, PROSOURCE TF20,) liquid Give 60 ml Prosource TF via tube daily. Flush tube with 30 ml water  before and after administration. Do not mix with tube feeding formula. 05/10/22   Eppie Gibson, MD  senna-docusate (SENOKOT-S) 8.6-50 MG tablet Place 2 tablets into feeding tube at bedtime as needed for mild constipation.    [provider]  urea (CARMOL) 40 % CREA Apply 1 Application topically at bedtime. To thick scaly areas of skin    [provider]  Water For Irrigation, Sterile (FREE WATER) SOLN Place 200 mLs into feeding tube every 4 (four) hours. 12/09/21   Barb Merino, MD      Allergies    Amlodipine besy-benazepril hcl and Shellfish allergy    Review of Systems   Review of Systems  Constitutional:  Negative for chills and fever.  Respiratory:  Negative for shortness of breath.   Cardiovascular:  Negative for chest pain.  Gastrointestinal:  Negative for abdominal pain.  Musculoskeletal:  Positive for back pain.  Neurological:  Negative for headaches.    Physical Exam Updated Vital Signs BP (!) 108/58   Pulse 74   Temp 98.3 F (36.8 C) (Rectal)   Resp 14   Ht '5\' 7"'$  (1.702 m)   Wt 60.8 kg   SpO2 98%   BMI 20.99 kg/m  Physical Exam Vitals and nursing note reviewed. Exam conducted with a chaperone present.  Constitutional:      General: He is not in acute distress.    Appearance: He is not ill-appearing.  HENT:     Head: Normocephalic and atraumatic.     Comments: Patient's face is slightly edematous, he has noted lesions on the left mandible, with 1 open wound present slightly bleeding at this time, no evidence of infection present.    Nose: No congestion.     Mouth/Throat:     Mouth: Mucous membranes are dry.     Comments: No trismus no torticollis, patient has a dry appearing mouth, with mucus-like discharge coming from the area Poor dental hygiene, no evidence of infection present.   Eyes:     Conjunctiva/sclera: Conjunctivae normal.  Cardiovascular:     Rate and Rhythm: Normal rate and regular rhythm.     Pulses: Normal pulses.     Heart  sounds: No murmur heard.    No friction rub. No gallop.  Pulmonary:     Effort: No respiratory distress.     Breath sounds: No wheezing, rhonchi or rales.  Abdominal:     Palpations: Abdomen is soft.     Tenderness: There is no abdominal tenderness. There is no right CVA tenderness or left CVA tenderness.  Genitourinary:    Comments: Chaperone present rectum was visualized there is no notable external hemorrhoids, no discharge present, digital records and was performed, he had strong sphincter tone, no bloody stools no melanotic looking stools Musculoskeletal:     Comments: Spine was palpated he had slight tenderness to palpation along his lumbar spine without crepitus or deformities noted, no pelvis instability, patient has 5 out  of 5 strength noted his toes ankle knees bilaterally, he has 4-5 strength versus 5 5 strength in the left hip flexor versus the right, this is limited due to the pain, sensation intact to light touch, he has 2+ dorsal pedal pulses, 2 set capillary refill.  Skin:    General: Skin is warm and dry.     Comments: Has a stage I pressure ulcer present on his sacrum, no evidence of infection present.  Neurological:     Mental Status: He is alert.  Psychiatric:        Mood and Affect: Mood normal.     ED Results / Procedures / Treatments   Labs (all labs ordered are listed, but only abnormal results are displayed) Labs Reviewed  BASIC METABOLIC PANEL  CBC WITH DIFFERENTIAL/PLATELET    EKG EKG Interpretation  Date/Time:  Saturday May 28 2022 22:28:25 EST Ventricular Rate:  73 PR Interval:  164 QRS Duration: 149 QT Interval:  426 QTC Calculation: 470 R Axis:   69 Text Interpretation: Sinus rhythm Right bundle branch block No significant change since last tracing Confirmed by Deno Etienne [phone]) on 05/28/2022 11:19:57 PM  Radiology DG Chest Portable 1 View  Result Date: 05/28/2022 CLINICAL DATA:  Intermittent crackles heard in the lower lobes  bilaterally EXAM: PORTABLE CHEST 1 VIEW COMPARISON:  03/08/2022 FINDINGS: Right IJ dual lumen catheter tip at the superior cavoatrial junction. Stable cardiomediastinal silhouette. No focal consolidation, pleural effusion, or pneumothorax. No acute osseous abnormality. Surgical clips left axilla. IMPRESSION: No active disease. Electronically Signed   By: Placido Sou M.D.   On: 05/28/2022 23:22   CT Lumbar Spine Wo Contrast  Result Date: 05/28/2022 CLINICAL DATA:  Low back pain.  Increased fracture risk. EXAM: CT LUMBAR SPINE WITHOUT CONTRAST TECHNIQUE: Multidetector CT imaging of the lumbar spine was performed without intravenous contrast administration. Multiplanar CT image reconstructions were also generated. RADIATION DOSE REDUCTION: This exam was performed according to the departmental dose-optimization program which includes automated exposure control, adjustment of the mA and/or kV according to patient size and/or use of iterative reconstruction technique. COMPARISON:  PET-CT dated April 13, 2022 FINDINGS: Segmentation: 5 lumbar type vertebrae. Alignment: Straightening of the lumbar spine. Vertebrae: No acute fracture or aggressive osseous lesion. Hemangioma in the L4 vertebral body. Large Schmorl node in the superior endplate of the L5 vertebral body, unchanged from prior examination of November 1 and September 14, 2021. Paraspinal and other soft tissues: Atherosclerotic calcification of distal aorta and branch vessels. No acute abnormality. Other: None Disc levels: Disc spaces: Multilevel degenerate disc disease with disc height loss and subchondral prominent at L3-L4 and L4-L5. T12-L1: No significant finding. L1-L2: Mild disc bulge without significant spinal canal or neural foraminal stenosis. L2-L3: Disc height loss and disc osteophyte complex with mild spinal canal stenosis. Mild facet joint arthropathy. Mild bilateral lateral recess stenosis. No significant neural foraminal stenosis. L3-L4: Disc  height loss and disc osteophyte complex. Vacuum disc phenomena about the anterior aspect of the disc space with prominent anterior osteophytes and small Schmorl node. Mild bilateral lateral recess stenosis and mild bilateral neural foraminal stenosis. Mild-to-moderate bilateral facet joint arthropathy. L4-L5: Advanced disc degeneration with large Schmorl node in the superior endplate of the L5 vertebral body. Moderate disc bulge with moderate bilateral lateral recess stenosis and mild bilateral neural foraminal stenosis. L5-S1: Broad-based disc bulge with moderate-to-severe bilateral lateral recess stenosis. Mild bilateral neural foraminal stenosis. Moderate facet joint arthropathy. IMPRESSION: 1. No acute fracture or traumatic subluxation. 2. Multilevel  degenerate disc disease with disc height loss prominent at L3-L4 and L4-L5. 3. Moderate disc bulge at L4-L5 with moderate bilateral lateral recess stenosis and mild bilateral neural foraminal stenosis. 4. Broad-based disc bulge at L5-S1 with moderate-to-severe bilateral lateral recess stenosis and mild bilateral neural foraminal stenosis. 5. Hemangioma in the L4 vertebral body. Large Schmorl node in the superior endplate of L5 vertebral body with surrounding sclerosis. Aortic Atherosclerosis (ICD10-I70.0). Electronically Signed   By: Keane Police D.O.   On: 05/28/2022 22:42    Procedures Procedures  {Document cardiac monitor, telemetry assessment procedure when appropriate:1}  Medications Ordered in ED Medications  oxyCODONE (Oxy IR/ROXICODONE) immediate release tablet 5 mg (5 mg Oral Not Given 05/28/22 1752)  sodium chloride (OCEAN) 0.65 % nasal spray 1 spray (has no administration in time range)  fentaNYL (SUBLIMAZE) injection 100 mcg (has no administration in time range)  fentaNYL (SUBLIMAZE) injection 100 mcg (100 mcg Intramuscular Given 05/28/22 1750)  HYDROmorphone (DILAUDID) injection 0.5 mg (0.5 mg Intravenous Given 05/28/22 2349)    ED  Course/ Medical Decision Making/ A&P                           Medical Decision Making Amount and/or Complexity of Data Reviewed Labs: ordered. Radiology: ordered.  Risk OTC drugs. Prescription drug management.   This patient presents to the ED for concern of back pain, this involves an extensive number of treatment options, and is a complaint that carries with it a high risk of complications and morbidity.  The differential diagnosis includes spine equina, metastatic disease, compression fracture    Additional history obtained:  Additional history obtained from daughter at bedside External records from outside source obtained and reviewed including heme-onc notes   Co morbidities that complicate the patient evaluation  Squamous cell carcinoma, end-stage renal disease  Social Determinants of Health:  N/A    Lab Tests:  I Ordered, and personally interpreted labs.  The pertinent results include:  ***   Imaging Studies ordered:  I ordered imaging studies including DG chest, CT lumbar spine I independently visualized and interpreted imaging which showed chest x-ray unremarkable, CT lumbar spine reveals disc degeneration at L3-L4 L4-L5, bulging disc at L4-L5, as well as L5-S1. I agree with the radiologist interpretation   Cardiac Monitoring:  The patient was maintained on a cardiac monitor.  I personally viewed and interpreted the cardiac monitored which showed an underlying rhythm of: N/A   Medicines ordered and prescription drug management:  I ordered medication including fentanyl I have reviewed the patients home medicines and have made adjustments as needed  Critical Interventions:  ***   Reevaluation:  Presents with with back pain, will obtain CT imaging, patient Dors that he missed his dialysis treatment yesterday, will obtain lab work for further evaluation of electrolyte derangement for missed hemodialysis.    Consultations Obtained:  I requested  consultation with the ***,  and discussed lab and imaging findings as well as pertinent plan - they recommend: ***    Test Considered:  ***    Rule out ****    Dispostion and problem list  After consideration of the diagnostic results and the patients response to treatment, I feel that the patent would benefit from ***.       {Document critical care time when appropriate:1} {Document review of labs and clinical decision tools ie heart score, Chads2Vasc2 etc:1}  {Document your independent review of radiology images, and any outside records:1} {Document your  discussion with family members, caretakers, and with consultants:1} {Document social determinants of health affecting pt's care:1} {Document your decision making why or why not admission, treatments were needed:1} Final Clinical Impression(s) / ED Diagnoses Final diagnoses:  None    Rx / DC Orders ED Discharge Orders     None

## 2022-05-28 NOTE — Discharge Instructions (Addendum)
Your imaging was reassuring, I have sent you home with oxycodone '10mg'$ , you may take this to help with your pain, please do not take this at the same time as your oxycodone '5mg'$   as this can make you very sleepy and decrease your ability to breathe.  I would alternate between oxycodone fives and tens.  I have sent an ambulatory referral to home health and they should be contacting you to schedule a house appointment for PT OT.  Come back to the emergency department if you develop chest pain, shortness of breath, severe abdominal pain, uncontrolled nausea, vomiting, diarrhea.

## 2022-05-28 NOTE — ED Triage Notes (Signed)
Pt arrived to ED via EMS from home w/ c/o chronic back pain x 3 weeks that has progressively gotten worse and is causing decreased ambulation. Pt hard to understand d/t mouth cancer. Pt is dialysis pt and is compliant who has been switched to MWF from T, TH, Sat. Last tx Thurs. A&Ox4

## 2022-05-29 LAB — CBC WITH DIFFERENTIAL/PLATELET
Abs Immature Granulocytes: 0 10*3/uL (ref 0.00–0.07)
Basophils Absolute: 0 10*3/uL (ref 0.0–0.1)
Basophils Relative: 0 %
Eosinophils Absolute: 0.2 10*3/uL (ref 0.0–0.5)
Eosinophils Relative: 1 %
HCT: 29.6 % — ABNORMAL LOW (ref 39.0–52.0)
Hemoglobin: 9.9 g/dL — ABNORMAL LOW (ref 13.0–17.0)
Lymphocytes Relative: 4 %
Lymphs Abs: 0.9 10*3/uL (ref 0.7–4.0)
MCH: 33.9 pg (ref 26.0–34.0)
MCHC: 33.4 g/dL (ref 30.0–36.0)
MCV: 101.4 fL — ABNORMAL HIGH (ref 80.0–100.0)
Monocytes Absolute: 1.2 10*3/uL — ABNORMAL HIGH (ref 0.1–1.0)
Monocytes Relative: 5 %
Neutro Abs: 21.1 10*3/uL — ABNORMAL HIGH (ref 1.7–7.7)
Neutrophils Relative %: 90 %
Platelets: 268 10*3/uL (ref 150–400)
RBC: 2.92 MIL/uL — ABNORMAL LOW (ref 4.22–5.81)
RDW: 16.5 % — ABNORMAL HIGH (ref 11.5–15.5)
WBC: 23.4 10*3/uL — ABNORMAL HIGH (ref 4.0–10.5)
nRBC: 0 % (ref 0.0–0.2)
nRBC: 0 /100 WBC

## 2022-05-29 LAB — BASIC METABOLIC PANEL
Anion gap: 13 (ref 5–15)
BUN: 64 mg/dL — ABNORMAL HIGH (ref 8–23)
CO2: 25 mmol/L (ref 22–32)
Calcium: 8.9 mg/dL (ref 8.9–10.3)
Chloride: 95 mmol/L — ABNORMAL LOW (ref 98–111)
Creatinine, Ser: 4.59 mg/dL — ABNORMAL HIGH (ref 0.61–1.24)
GFR, Estimated: 13 mL/min — ABNORMAL LOW (ref >60)
Glucose, Bld: 143 mg/dL — ABNORMAL HIGH (ref 70–99)
Potassium: 3.5 mmol/L (ref 3.5–5.1)
Sodium: 133 mmol/L — ABNORMAL LOW (ref 135–145)

## 2022-05-29 MED ORDER — OXYCODONE-ACETAMINOPHEN 10-325 MG PO TABS: 1.0000 | ORAL_TABLET | Freq: Three times a day (TID) | ORAL | 0 refills | Status: AC | PRN

## 2022-05-29 NOTE — ED Notes (Signed)
Patient was able to sit on the side of the bed with assistance and ambulate to a standing position with  assistance. Patient states his pain has improved slightly, but he still has back pain.

## 2022-06-02 ENCOUNTER — Telehealth: Payer: Self-pay

## 2022-06-02 NOTE — Telephone Encounter (Signed)
Patient's daughter sent a message to office advising patient is currently inpatient at [facility] and will need to cancel AVG surgery on 06/03/22.

## 2022-06-03 ENCOUNTER — Ambulatory Visit (HOSPITAL_COMMUNITY)
Admission: RE | Admit: 2022-06-03 | Payer: Medicare (Managed Care) | Source: Home / Self Care | Admitting: Vascular Surgery

## 2022-06-03 SURGERY — INSERTION OF ARTERIOVENOUS (AV) GORE-TEX GRAFT ARM
Anesthesia: Choice | Laterality: Left

## 2022-06-08 NOTE — Telephone Encounter (Signed)
Per [facility], patient still currently an inpatient.

## 2022-06-10 NOTE — Telephone Encounter (Signed)
Patient remains inpatient at [facility].

## 2022-06-17 ENCOUNTER — Encounter: Payer: Self-pay | Admitting: Nutrition

## 2022-06-17 NOTE — Telephone Encounter (Signed)
Noted patient was discharged to a SNF on IV Abx until 07/10/22 for a head and neck polymicrobial infection. Per M. Shuh, PA-C, patient can be rescheduled once he has completed Abx and cleared of infection.

## 2022-06-17 NOTE — Progress Notes (Signed)
Received call from Raquel Sarna RD at [facility] to obtain nutrition history and TF updates. Patient is now being seen at [facility] and will be followed by their RD team.

## 2022-06-22 ENCOUNTER — Ambulatory Visit: Payer: No Typology Code available for payment source | Attending: Radiation Oncology | Admitting: Physical Therapy

## 2022-06-28 ENCOUNTER — Other Ambulatory Visit (HOSPITAL_COMMUNITY): Payer: Self-pay | Admitting: Vascular Surgery

## 2022-06-28 DIAGNOSIS — C069 Malignant neoplasm of mouth, unspecified: Secondary | ICD-10-CM

## 2022-07-11 NOTE — Therapy (Signed)
[facility] [address] [place], [place], [address] Phone: [phone]   Fax:  [phone]  Patient Details  Name: John Jacobson Sr. MRN: [id number] Date of Birth: 1955/09/23 Referring Provider:  Eppie Gibson, MD  Encounter Date: 07/11/2022 SPEECH THERAPY DISCHARGE SUMMARY  Visits from Start of Care: 2  Current functional level related to goals / functional outcomes:  Admission to [facility] approx Jun 30, 2022 due to: 1. 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  ST goals from pt's last attended ST session below: SHORT TERM GOALS: Target date: 04/15/2022     Pt will demo swallow precautions from FEES with runny purees and thin liquids with straw in 3 sessions Baseline: Goal status: Ongoing   2.  Pt will demo HEP for swallowing with rare min A over 2 sessions Baseline:  Goal status: Ongoing   3.  Pt will verbally ID 3 overt s/s aspiration PNA in 2 sessions Baseline:  Goal status: Ongoing     LONG TERM GOALS: Target date: 05/20/2022     Pt will demo HEP for swallowing with rare min A over 2 sessions Baseline:  Goal status: Ongoing   2.  Pt will demo swallow precautions from FEES with runny purees and thin liquids with straw in 3 sessions Baseline:  Goal status: Ongoing   3.  Pt score on EAT-10 will improve over the course of ST Baseline:  Goal status: Ongoing   4.  Pt will incr speech intelligibility to 95-100% in 10 minutes conversation with modified independence in 2 sessions Baseline:  Goal status: Ongoing  Remaining deficits: Assumed all deficits remain.   Education / Equipment: See  OPST notes for details.    Patient agrees to discharge. Patient goals were partially met. Patient is being discharged due to a change in medical status.Marland Kitchen    Cadiz, Randall 07/11/2022, 1:19 PM  [facility] [address], [place], [place], [address] Phone: [phone]   Fax:  [phone]

## 2022-07-19 NOTE — Telephone Encounter (Signed)
Attempted to reach patient to inquire if IV Abx have been completed and infection cleared, so left AVG surgery could be rescheduled. Left message for patient to return call.

## 2022-07-20 NOTE — Telephone Encounter (Signed)
Left voice mail message for patient to return call.

## 2022-07-21 ENCOUNTER — Telehealth: Payer: Self-pay

## 2022-07-21 NOTE — Telephone Encounter (Signed)
Called pt on both lines in an attempt to reach him to schedule surgery. LVM on his cell number asking him to call us back.

## 2022-07-22 NOTE — Progress Notes (Shared)
[facility] Note  07/27/2022     CHIEF COMPLAINT Patient presents for No chief complaint on file.  HISTORY OF PRESENT ILLNESS: John Jacobson. is a 67 y.o. male who presents to the clinic today for:     Referring physician: Inc, [place],  [place] [id number]  HISTORICAL INFORMATION:   Selected notes from the MEDICAL RECORD NUMBER Referred by Dr. Dorian Pod [facility]) for DM exam   CURRENT MEDICATIONS: No current outpatient medications on file. (Ophthalmic Drugs)   No current facility-administered medications for this visit. (Ophthalmic Drugs)   Current Outpatient Medications (Other)  Medication Sig   acetaminophen (TYLENOL) 500 MG tablet Place 1,000 mg into feeding tube 2 (two) times daily as needed for mild pain.   Amino Acids-Protein Hydrolys (FEEDING SUPPLEMENT, PRO-STAT SUGAR FREE 64,) LIQD Take 30 mLs by mouth 2 (two) times daily.   aspirin 81 MG EC tablet Take 81 mg by mouth daily. Per tube   atorvastatin (LIPITOR) 40 MG tablet Place 40 mg into feeding tube daily.   benzocaine (BABY ORAJEL) 7.5 % oral gel Use as directed 1 Application in the mouth or throat every 2 (two) hours as needed for pain.   camphor-menthol (SARNA) lotion Apply 1 Application topically 3 (three) times daily as needed (dry skin.).   Camphor-Menthol-Methyl Sal (SALONPAS EX) Place 1 patch onto the skin daily as needed (low back pain.).   carvedilol (COREG) 25 MG tablet Take 25 mg by mouth every 12 (twelve) hours.   chlorhexidine (PERIDEX) 0.12 % solution Use as directed 15 mLs in the mouth or throat in the morning and at bedtime. Swish and spit   Emollient (BAG BALM) OINT ointment Apply 1 Application topically 4 (four) times daily as needed for dry skin.   gabapentin (NEURONTIN) 250 MG/5ML solution Place 300 mg into feeding tube at bedtime.   Glucose (RA TRUEPLUS GLUCOSE) 15 GM/32ML GEL Give 15 g by tube  daily as needed (low blood sugar). Low Glucose   lidocaine (XYLOCAINE) 2 % solution Patient: Mix 1part 2% viscous lidocaine, 1part H20. Coat mouth with and swallow 60m of diluted mixture, 368m before meals and at bedtime, up to QID   linagliptin (TRADJENTA) 5 MG TABS tablet Take 5 mg by mouth daily.   Menthol, Topical Analgesic, (BIOFREEZE ROLL-ON EX) Apply 1 Application topically every 4 (four) hours as needed (painful muscle/joint). 5%   Mouthwashes (BIOTENE DRY MOUTH MT) Use as directed 1 spray in the mouth or throat every 2 (two) hours as needed (dry mouth).   nitroGLYCERIN (NITROSTAT) 0.4 MG SL tablet Place 0.4 mg under the tongue every 5 (five) minutes x 3 doses as needed for chest pain.   oxyCODONE (OXY IR/ROXICODONE) 5 MG immediate release tablet Place 5 mg into feeding tube every 4 (four) hours as needed for severe pain or moderate pain.   pantoprazole (PROTONIX) 2 mg/mL suspension Place 40 mg into feeding tube daily.   polyethylene glycol (MIRALAX / GLYCOLAX) 17 g packet Take 17 g by mouth daily as needed for moderate constipation.   Protein (FEEDING SUPPLEMENT, PROSOURCE TF20,) liquid Give 60 ml Prosource TF via tube daily. Flush tube with 30 ml water before and after administration. Do not mix with tube feeding formula. (Patient not taking: Reported on 05/31/2022)   urea (CARMOL) 40 % CREA Apply 1 Application topically at bedtime. To thick scaly areas of skin   vitamin  D3 (CHOLECALCIFEROL) 25 MCG tablet Take 1,000 Units by mouth daily.   Water For Irrigation, Sterile (FREE WATER) SOLN Place 200 mLs into feeding tube every 4 (four) hours.   No current facility-administered medications for this visit. (Other)   REVIEW OF SYSTEMS:   ALLERGIES Allergies  Allergen Reactions   Amlodipine Besy-Benazepril Hcl Anaphylaxis, Shortness Of Breath and Swelling    Mouth and tongue swelling   Shellfish Allergy Anaphylaxis, Shortness Of Breath and Swelling   PAST MEDICAL HISTORY Past Medical  History:  Diagnosis Date   Anemia    Asthma    Chronic kidney disease    Chronic lower back pain 1995   Lumbar back surgery   Diabetic retinopathy ([place])    PDR OU   DM (diabetes mellitus) type II controlled with renal manifestation ([place])    CKD-4, peripheral neuropathy, PAD   Erectile dysfunction due to diabetes mellitus ([place])    Hepatitis C    Hyperlipidemia associated with type 2 diabetes mellitus ([place])    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  Normal EF 60 to 65%.?  GR 1 DD.  Mild LA dilation.;  CKD IV  - Cr 3.2   Hypertensive retinopathy    OU   RBBB (right bundle branch block)    Resistant hypertension    Managed by Dr. 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[facility])    Stroke ([place]) 10/2016   Has residual ataxia and partial left-sided hemiparesis   Past Surgical History:  Procedure Laterality Date   BACK SURGERY  1995   CATARACT EXTRACTION Bilateral    EXCISION MASS NECK N/A 08/06/2021   Procedure: EXCISION OF MANDIBULAR MASS;  Surgeon: Izora Gala, MD;  Location: [place];  Service: ENT;  Laterality: N/A;   EYE SURGERY Bilateral    Cat Sx   PARS PLANA VITRECTOMY Left 01/25/2017   TOE AMPUTATION Right 2016   Right second toe; dry gangrene   TRANSTHORACIC ECHOCARDIOGRAM  10/2016   TTE May 2018: Normal LV size and severe LVH-without LVOT gradient/S.A.M.  Normal EF 60 to 65%.?  GR 1 DD.  Mild LA dilation.   FAMILY HISTORY Family History  Problem Relation Age of Onset   Coronary artery disease Mother        MI in her 17s   Diabetes Mother    Macular degeneration Mother    Hypertension Other    Diabetes Other    Alzheimer's disease Other    Coronary artery disease Brother    Diabetes Brother    Diabetes Sister    SOCIAL HISTORY Social History   Tobacco Use   Smoking status: Former    Packs/day: 0.25     Types: Cigarettes    Quit date: 01/07/2018    Years since quitting: 4.5   Smokeless tobacco: Never  Vaping Use   Vaping Use: Never used  Substance Use Topics   Alcohol use: No    Comment: Few beers every other day hx   Drug use: No       OPHTHALMIC EXAM:  Not recorded     IMAGING AND PROCEDURES  Imaging and Procedures for @TODAY$ @          ASSESSMENT/PLAN:  No diagnosis found.   1. Proliferative diabetic retinopathy w/ DME, OU  - lost to f/u from 12.19.22 to 08.02.23 (9 mos) due to oral  cancer ([facility])  - lost to f/u from 4.11.22 to 9.26.22 -- 5+ mos, CHF exacerbations  - former pt of Dwana Melena at [facility] and Adonis Brook at [facility] -- known history of proliferative diabetic retinopathy  - history of PPV OS and laser PRP OD with Dr. Manuella Ghazi for PDR OU w/ TRD OS -- 01/25/17  - history of intravitreal anti-VEGF therapy with Dr. Anderson Malta -- last IVE ~01/2018  - s/p PRP fill in OD (02.27.20) -- good fill in laser in place  - s/p IVA OU #1 (02.13.20), #2 (03.12.20), #3 (05.10.20), #4 (06.08.20), #5 (07.06.20), #6 (08.03.20), #7 (08.31.20), #8 (09.28.20), #9 (10.26.20)  - ?resistance to IVA  - s/p IVE OU #1 (11.23.20), #2 (01.22.21), #3 (03.05.21), #4 (04.02.21), #5 (04.30.21), #6 (06.07.21), #7 (07.19.21), #8 (08.25.21), #9 (10.06.21), #10 (11.10.21), #11 (12.20.21), #12 (02.14.22), #13 (04.11.22), #14 (09.26.22)  - exam shows stable regression of fine NVD OD, PRP laser in place OU  - FA (08.31.20) shows retinal NV vastly improved OD; significant vascular perfusion defects and increased FAZ OU; late leaking MA OU  - OCT shows OD: Trace cystic changes -- scattered and non-central, persistent vitreous opacities; OS: interval improvement in IRF / edema temporal macula   - BCVA 20/70 from 20/50 OD, OS 20/25 from 20/20 -- pt is monovision, OD: near, OS: distance -- dec VA mostly from dry eyes  - recommend holding off on IVE OU today 09.20.23 -- eyes relatively stable  - [facility]  informed consent form signed and scanned on 01.22.2021  - [email or web address] Benefits Investigation initiated 10.26.2020 -- approved as of 11.30.20  - [facility] has re authorized IVE injections  - f/u 3-4 months, DFE/OCT, possible injection(s) -- OD eye is near eye, check vision with near card  2. Epiretinal membrane, both eyes   - mild ERM OU  - asymptomatic, no metamorphopsia  - no indication for surgery at this time  - monitor for now  3,4. Hypertensive retinopathy OU  - discussed importance of tight BP control  - monitor  5. Pseudophakia OU  - s/p CE/IOL OU with expert surgeon, Dr. Kathlen Mody (OD: 2.25.21, OS: 03.24.21)  - beautiful surgeries, doing well  - post op drops per Dr. Kathlen Mody  - monitor  6. Dry eyes OU  - recommend artificial tears and lubricating ointment as needed  Ophthalmic Meds Ordered this visit:  No orders of the defined types were placed in this encounter.    No follow-ups on file.  There are no Patient Instructions on file for this visit.  This document serves as a record of services personally performed by Gardiner Sleeper, MD, PhD. It was created on their behalf by Orvan Falconer, an ophthalmic technician. The creation of this record is the provider's dictation and/or activities during the visit.    Electronically signed by: Orvan Falconer, OA, 07/22/22  10:28 AM   Gardiner Sleeper, M.D., Ph.D. Diseases & Surgery of the Retina and Vitreous [facility]: M myopia (nearsighted); A astigmatism; H hyperopia (farsighted); P presbyopia; Mrx spectacle prescription;  CTL contact lenses; OD right eye; OS left eye; OU both eyes  XT exotropia; ET esotropia; PEK punctate epithelial keratitis; PEE punctate epithelial erosions; DES dry eye syndrome; MGD meibomian gland dysfunction; ATs artificial tears; PFAT's preservative free artificial tears; [place] nuclear sclerotic cataract; PSC posterior subcapsular cataract; ERM epi-retinal membrane;  PVD posterior vitreous detachment; RD retinal detachment; DM diabetes mellitus; DR diabetic retinopathy; NPDR non-proliferative  diabetic retinopathy; PDR proliferative diabetic retinopathy; CSME clinically significant macular edema; DME diabetic macular edema; dbh dot blot hemorrhages; CWS cotton wool spot; POAG primary open angle glaucoma; C/D cup-to-disc ratio; HVF humphrey visual field; GVF goldmann visual field; OCT optical coherence tomography; IOP intraocular pressure; BRVO Branch retinal vein occlusion; CRVO central retinal vein occlusion; CRAO central retinal artery occlusion; BRAO branch retinal artery occlusion; RT retinal tear; SB scleral buckle; PPV pars plana vitrectomy; VH Vitreous hemorrhage; PRP panretinal laser photocoagulation; IVK intravitreal kenalog; VMT vitreomacular traction; MH Macular hole;  NVD neovascularization of the disc; NVE neovascularization elsewhere; AREDS age related eye disease study; ARMD age related macular degeneration; POAG primary open angle glaucoma; EBMD epithelial/anterior basement membrane dystrophy; ACIOL anterior chamber intraocular lens; IOL intraocular lens; PCIOL posterior chamber intraocular lens; Phaco/IOL phacoemulsification with intraocular lens placement; Fleming-Neon photorefractive keratectomy; LASIK laser assisted in situ keratomileusis; HTN hypertension; DM diabetes mellitus; COPD chronic obstructive pulmonary disease

## 2022-07-25 ENCOUNTER — Telehealth: Payer: Self-pay

## 2022-07-25 NOTE — Telephone Encounter (Signed)
Attempted to reach pt to schedule surgery. Left a voicemail asking him to return our call.

## 2022-07-27 ENCOUNTER — Encounter (INDEPENDENT_AMBULATORY_CARE_PROVIDER_SITE_OTHER): Payer: Medicare (Managed Care) | Admitting: Ophthalmology

## 2022-07-27 DIAGNOSIS — Z961 Presence of intraocular lens: Secondary | ICD-10-CM

## 2022-07-27 DIAGNOSIS — H04123 Dry eye syndrome of bilateral lacrimal glands: Secondary | ICD-10-CM

## 2022-07-27 DIAGNOSIS — H35033 Hypertensive retinopathy, bilateral: Secondary | ICD-10-CM

## 2022-07-27 DIAGNOSIS — I10 Essential (primary) hypertension: Secondary | ICD-10-CM

## 2022-07-27 DIAGNOSIS — H35373 Puckering of macula, bilateral: Secondary | ICD-10-CM

## 2022-07-27 DIAGNOSIS — E113513 Type 2 diabetes mellitus with proliferative diabetic retinopathy with macular edema, bilateral: Secondary | ICD-10-CM

## 2022-08-01 ENCOUNTER — Telehealth: Payer: Self-pay

## 2022-08-01 NOTE — Telephone Encounter (Signed)
Contacted [facility] regarding scheduling left AVG and informed by Carmelina Noun, appointment scheduler who advised that patient expired on 08-18-2022, cause unknown.

## 2022-08-09 ENCOUNTER — Ambulatory Visit: Payer: Self-pay | Admitting: Radiation Oncology

## 2022-08-12 DEATH — deceased

## 2022-08-17 ENCOUNTER — Ambulatory Visit: Payer: Medicare (Managed Care) | Admitting: Podiatry

## 2022-10-10 ENCOUNTER — Ambulatory Visit: Payer: Non-veteran care | Admitting: General Practice

## 2022-10-10 ENCOUNTER — Ambulatory Visit: Payer: Self-pay | Admitting: General Practice

## 2022-10-24 ENCOUNTER — Ambulatory Visit: Payer: Non-veteran care | Admitting: Cardiology
